# Patient Record
Sex: Male | Born: 1955 | Race: White | Hispanic: No | Marital: Single | Smoking: Never smoker
Health system: Southern US, Academic
[De-identification: ages and names within clinical notes are randomized; demographics above are authoritative.]

## PROBLEM LIST (undated history)

## (undated) ENCOUNTER — Ambulatory Visit (HOSPITAL_BASED_OUTPATIENT_CLINIC_OR_DEPARTMENT_OTHER): Payer: Self-pay | Admitting: Gastroenterology

## (undated) DIAGNOSIS — Z955 Presence of coronary angioplasty implant and graft: Secondary | ICD-10-CM

## (undated) DIAGNOSIS — K219 Gastro-esophageal reflux disease without esophagitis: Secondary | ICD-10-CM

## (undated) DIAGNOSIS — I2510 Atherosclerotic heart disease of native coronary artery without angina pectoris: Secondary | ICD-10-CM

## (undated) DIAGNOSIS — I219 Acute myocardial infarction, unspecified: Secondary | ICD-10-CM

## (undated) DIAGNOSIS — K469 Unspecified abdominal hernia without obstruction or gangrene: Secondary | ICD-10-CM

## (undated) DIAGNOSIS — E785 Hyperlipidemia, unspecified: Secondary | ICD-10-CM

## (undated) DIAGNOSIS — E119 Type 2 diabetes mellitus without complications: Secondary | ICD-10-CM

## (undated) DIAGNOSIS — I499 Cardiac arrhythmia, unspecified: Secondary | ICD-10-CM

## (undated) DIAGNOSIS — G4730 Sleep apnea, unspecified: Secondary | ICD-10-CM

## (undated) DIAGNOSIS — R197 Diarrhea, unspecified: Secondary | ICD-10-CM

## (undated) DIAGNOSIS — G459 Transient cerebral ischemic attack, unspecified: Secondary | ICD-10-CM

## (undated) DIAGNOSIS — G629 Polyneuropathy, unspecified: Secondary | ICD-10-CM

## (undated) DIAGNOSIS — I10 Essential (primary) hypertension: Secondary | ICD-10-CM

## (undated) DIAGNOSIS — Z8673 Personal history of transient ischemic attack (TIA), and cerebral infarction without residual deficits: Secondary | ICD-10-CM

## (undated) DIAGNOSIS — E7849 Other hyperlipidemia: Secondary | ICD-10-CM

## (undated) DIAGNOSIS — Z973 Presence of spectacles and contact lenses: Secondary | ICD-10-CM

## (undated) DIAGNOSIS — E1140 Type 2 diabetes mellitus with diabetic neuropathy, unspecified: Secondary | ICD-10-CM

## (undated) HISTORY — DX: Personal history of transient ischemic attack (TIA), and cerebral infarction without residual deficits: Z86.73

## (undated) HISTORY — DX: Type 2 diabetes mellitus without complications: E11.9

## (undated) HISTORY — DX: Essential (primary) hypertension: I10

## (undated) HISTORY — DX: Gastro-esophageal reflux disease without esophagitis: K21.9

## (undated) HISTORY — DX: Type 2 diabetes mellitus with diabetic neuropathy, unspecified: E11.40

## (undated) HISTORY — DX: Other hyperlipidemia: E78.49

## (undated) HISTORY — DX: Acute myocardial infarction, unspecified: I21.9

## (undated) HISTORY — PX: HX BACK SURGERY: SHX140

## (undated) HISTORY — PX: HAND SURGERY: SHX662

## (undated) HISTORY — DX: Atherosclerotic heart disease of native coronary artery without angina pectoris: I25.10

## (undated) HISTORY — PX: NECK SURGERY: SHX720

## (undated) HISTORY — PX: AMPUTATION AT METACARPAL: SUR22

## (undated) HISTORY — PX: HX CERVICAL SPINE SURGERY: 2100001197

## (undated) HISTORY — PX: CARPAL TUNNEL RELEASE: SHX101

## (undated) HISTORY — PX: LUMBAR LAMINECTOMY: SHX95

## (undated) HISTORY — PX: CERVICAL FUSION: SHX112

## (undated) HISTORY — PX: CHOLECYSTECTOMY: SHX55

## (undated) HISTORY — PX: CORONARY ANGIOPLASTY WITH STENT PLACEMENT: SHX49

---

## 1989-12-19 ENCOUNTER — Ambulatory Visit: Admit: 1989-12-19 | Payer: Self-pay | Source: Ambulatory Visit

## 1990-01-10 ENCOUNTER — Inpatient Hospital Stay
Admission: RE | Admit: 1990-01-10 | Disposition: A | Discharge status: Home / Self Care | Payer: Self-pay | Source: Ambulatory Visit

## 1992-11-11 ENCOUNTER — Ambulatory Visit: Admit: 1992-11-11 | Disposition: A | Discharge status: Home / Self Care | Payer: Self-pay | Source: Ambulatory Visit

## 1995-03-30 ENCOUNTER — Ambulatory Visit (HOSPITAL_COMMUNITY): Payer: Self-pay

## 1998-04-18 HISTORY — PX: HX LUMBAR SPINE SURGERY: 2100001196

## 2008-04-18 HISTORY — PX: HX STENTING (ANY): 2100001347

## 2011-09-23 ENCOUNTER — Ambulatory Visit (HOSPITAL_COMMUNITY): Payer: Self-pay | Admitting: Internal Medicine

## 2011-10-19 ENCOUNTER — Observation Stay
Admission: AD | Admit: 2011-10-19 | Disposition: A | Discharge status: Home / Self Care | Payer: Self-pay | Source: Other Acute Inpatient Hospital

## 2012-04-18 HISTORY — PX: COLONOSCOPY: SHX174

## 2012-05-18 LAB — LIPID PANEL
Cholesterol: 155 mg/dL (ref 75–199)
Coronary Heart Disease Risk: 4.84
HDL: 32 mg/dL — ABNORMAL LOW (ref 40–55)
LDL Calculated: 66 mg/dL
Triglycerides: 286 mg/dL — ABNORMAL HIGH (ref 10–150)
VLDL: 57 — ABNORMAL HIGH (ref 0–40)

## 2012-05-18 LAB — VH CARDIAC PROF.WITH TROPONIN
Creatine Kinase (CK): 68 U/L (ref 30–230)
Creatine Kinase (CK): 69 U/L (ref 30–230)
Creatinine Kinase MB (CKMB): 2.8 ng/mL (ref 0.1–6.0)
Creatinine Kinase MB (CKMB): 2.9 ng/mL (ref 0.1–6.0)
Troponin I: <0.01 ng/mL (ref 0.00–0.02)
Troponin I: <0.01 ng/mL (ref 0.00–0.02)

## 2012-05-18 LAB — VH DEXTROSE STICK GLUCOSE
Glucose POCT: 190 mg/dL — ABNORMAL HIGH (ref 70–99)
Glucose POCT: 222 mg/dL — ABNORMAL HIGH (ref 70–99)
Glucose POCT: 259 mg/dL — ABNORMAL HIGH (ref 70–99)
Glucose POCT: 286 mg/dL — ABNORMAL HIGH (ref 70–99)
Glucose POCT: 289 mg/dL — ABNORMAL HIGH (ref 70–99)
Glucose POCT: 294 mg/dL — ABNORMAL HIGH (ref 70–99)
Glucose POCT: 299 mg/dL — ABNORMAL HIGH (ref 70–99)
Glucose POCT: 331 mg/dL — ABNORMAL HIGH (ref 70–99)
Glucose POCT: 342 mg/dL — ABNORMAL HIGH (ref 70–99)

## 2012-05-18 LAB — CBC AND DIFFERENTIAL
Basophils %: 0.3 % (ref 0.0–3.0)
Basophils Absolute: 0 10*3/uL (ref 0.0–0.3)
Eosinophils %: 3.1 % (ref 0.0–7.0)
Eosinophils Absolute: 0.2 10*3/uL (ref 0.0–0.8)
Hematocrit: 46.8 % (ref 39.0–52.5)
Hemoglobin: 16.2 gm/dL (ref 13.0–17.5)
Lymphocytes Absolute: 2.6 10*3/uL (ref 0.6–5.1)
Lymphocytes: 32.6 % (ref 15.0–46.0)
MCH: 30 pg (ref 28–35)
MCHC: 35 gm/dL (ref 32–36)
MCV: 86 fL (ref 80–100)
MPV: 7.1 fL (ref 6.0–10.0)
Monocytes Absolute: 0.5 10*3/uL (ref 0.1–1.7)
Monocytes: 6.7 % (ref 3.0–15.0)
Neutrophils %: 57.3 % (ref 42.0–78.0)
Neutrophils Absolute: 4.5 10*3/uL (ref 1.7–8.6)
PLT CT: 191 10*3/uL (ref 130–440)
RBC: 5.42 10*6/uL (ref 4.00–5.70)
RDW: 12.4 % (ref 11.0–14.0)
WBC: 7.9 10*3/uL (ref 4.0–11.0)

## 2012-05-18 LAB — BASIC METABOLIC PANEL
Anion Gap: 13.8 mMol/L (ref 7.0–18.0)
BUN / Creatinine Ratio: 12.7 Ratio (ref 10.0–30.0)
BUN: 10 mg/dL (ref 7–22)
CO2: 26.2 mMol/L (ref 20.0–30.0)
Calcium: 9.4 mg/dL (ref 8.5–10.5)
Chloride: 99 mMol/L (ref 98–110)
Creatinine: 0.79 mg/dL — ABNORMAL LOW (ref 0.80–1.30)
EGFR: >60 mL/min/{1.73_m2}
Glucose: 245 mg/dL — ABNORMAL HIGH (ref 70–99)
Osmolality Calc: 277 mOsm/kg (ref 275–300)
Potassium: 4 mMol/L (ref 3.5–5.3)
Sodium: 135 mMol/L — ABNORMAL LOW (ref 136–147)

## 2012-08-28 LAB — ECG 12-LEAD
P Wave Axis: 44 deg
P Wave Axis: 62 deg
P Wave Duration: 110 ms
P Wave Duration: 110 ms
P-R Interval: 190 ms
P-R Interval: 196 ms
Patient Age: 55 years
Patient Age: 55 years
Q-T Dispersion: 24 ms
Q-T Dispersion: 58 ms
Q-T Interval(Corrected): 421 ms
Q-T Interval(Corrected): 427 ms
Q-T Interval: 350 ms
Q-T Interval: 370 ms
QRS Axis: 16 deg
QRS Axis: 31 deg
QRS Duration: 78 ms
QRS Duration: 78 ms
T Axis: 20 deg
T Axis: 51 deg
Ventricular Rate: 80 /min
Ventricular Rate: 87 /min

## 2012-09-08 NOTE — Discharge Summary (Signed)
[facility]                               DISCHARGE SUMMARY              NAME: Michael [name], Michael [name]                 ADMITTED:   10/19/2011       MR#:  [id number]                            DISCHARGED: 10/21/2011       DOB:  04-10-1956                           ACCT#:      [id number]       _________________________________________________________________       CHIEF COMPLAINT:       Chest pain.              HISTORY OF PRESENT ILLNESS:       The patient is a 56 year old white male with a past medical       history significant for diabetes mellitus, MI, coronary disease,       status post stents in 2000, dyslipidemia, depression, anxiety       and neuropathy, was admitted for his complaints of chest pain.       The patient was admitted for chest pain to rule out MI protocol,       was ruled out by cardiac enzymes.  He was scheduled for a stress       Cardiolite during which he developed chest pain.  He only was       given the contrast and he underwent rest images, which were       apparently normal.  I discontinued the stress test, and       consulted the Cardiology service.  The patient was seen by Dr.       Gwyneth Revels scheduled for cardiac catheterization.  He underwent       cardiac cath showing no major occlusive disease needing to be       PTCA or stented.  The patient will be returned to the floor.       After he is ambulatory, he will be discharged home.              DISCHARGE MEDICATIONS:       He will be discharged on,              1.  Abilify 10 mg daily.       2.  Aspirin 81 mg daily.       3.  Amitriptyline 100 mg at bedtime.       4.  Clonazepam 0.5 mg b.i.d.       5.  Famotidine 20 mg b.i.d.       6.  Gabapentin 300 mg t.i.d.       7.  Glimepiride 4 mg every morning.       8.  Hydrochlorothiazide 25 mg half a tab daily.       9.  Isosorbide 20 mg daily.       10.  Januvia 100 mg daily.       11.  Lovaza 4 mg daily.       12.  Plavix 75 mg daily.  13.  Toprol-XL 100 mg  once daily.       14.  WelChol 625 mg six tabs once daily.       15.  Xyzal 5 mg daily.       16.  Zoloft 50 mg daily.              The patient will be assigned to [facility] for followup.       All medications were dispensed.              FINAL DIAGNOSES:       1.  Chest pain, which is noncardiac.       2.  History of coronary disease.       3.  Anxiety.       4.  Diabetes mellitus.       5.  Hypertension.                                            **PRELIMINARY REPORT UNLESS SIGNED**                                SEE DOCUMENT IMAGING SYSTEM FOR FINAL REPORT                                                               ________________________________                                        Jamas Lav, MD                                                                                                                 ID:   [id number]                                                          DocType:   [id number]       \:   RR                                                                 /:   394  DD:  10/21/2011 14:59:12                                                DT:  10/22/2011 03:18:15                                                JOB: [id number]                                                            CC:  PHYSICIAN NO PRIMARY (1002)            DR NO REFERRAL (1000)                   >                                                                  Authenticated by Jamas Lav, MD On 10/22/2011 03:56:30 PM

## 2012-09-08 NOTE — H&P (Signed)
[facility]                             HISTORY AND PHYSICAL              NAME: Michael [name], Michael [name]                 ADMITTED:   10/19/2011       MR#:  [id number]                            ACCT#:      [id number]       DOB:  [birth year]       _________________________________________________________________       PRIMARY CARE PHYSICIAN:       No PCP.              CHIEF COMPLAINT:       Chest pain and diarrhea.              HISTORY OF PRESENT ILLNESS:       This is a 57 year old male with a history of diabetes, coronary       artery disease, three myocardial infarctions status post stent       who is coming to the facility because yesterday all day he had       diarrhea, he states approximately 10 times, not associated with       any pain or any blood.  The diarrhea resolved after ______       regarding diarrhea, but this morning while he was sitting       talking to his coworker, he had some chest pain he described as       located in the substernal area, gradual onset, persistent, it       became moderate to severe at some point, it lasted for 30       minutes, the pain has been going on for a few hours, but only it       is mild.  He states he had some mild pressure sensation of his       chest.  He did not have any associated symptoms like cough,       shortness of breath or fever, but he had some sweats when he was       having the significant pain.  Because of that, the patient went       to [facility] and then he was transferred to this       facility.              PAST MEDICAL HISTORY:       1.  Diabetes.       2.  Myocardial infarction.       3.  Coronary artery disease status post two stents in 2000.       4.  Dyslipidemia.       5.  Depression.       6.  Anxiety.       7.  Neuropathy.              PAST SURGICAL HISTORY:       1.  Cardiac stents.       2.  Back surgery.       3.  Neck surgery.              FAMILY HISTORY:  Parents with coronary artery  disease.              SOCIAL HISTORY:       Denies use of alcohol, tobacco or illicit drugs.              REVIEW OF SYSTEMS:       Pertinent positives as described in HPI.  The rest of 10 point       review of system was done and negative.              HOME MEDICATIONS:       The patient states that the only medication he has been taking       has been Klonopin, he is supposed to be on a long list of       medication, but he has not been able to afford.  So, again for              the past two months, he has only taken Klonopin.  Full list of       home medications includes:              1.  Abilify.       2.  Aspirin.       3.  Amitriptyline.       4.  Clonazepam.       5.  Famotidine.       6.  Gabapentin.       7.  Glimepiride.       8.  Hydrochlorothiazide.       9.  ______       10.  Januvia.       11.  Lantus.       12.  Lovaza.       13.  NovoLog.       14.  Plavix.       15.  Toprol.       16.  WelChol.       17.  Levocetirizine.       18.  Zoloft.       19.  Cyclobenzaprine.       20.  Nitrostat.       21.  Tramadol.              ALLERGIES:       MORPHINE, GLUCOPHAGE, and KEFLEX.              PHYSICAL EXAMINATION:       VITAL SIGNS:  Temperature of 97.5, heart rate 76, respirations       18, O2 sat 98% on room air, and blood pressure 122/77.       CONSTITUTIONAL:  The patient is awake, alert.  He does not seem       to be in any acute distress right now.       EYES:  Pupils equal, round, and reactive to light, extraocular       movements intact.       ENT/MOUTH:  Ears without any abnormalities or secretions.  No       nasal discharge or lesions.  Moist mucous membranes.       NECK:  No JVD.  No masses.  Trachea midline.       CARDIOVASCULAR:  Regular rate and rhythm.  No murmur.       PULMONARY:  No respiratory distress.  No retractions.  Clear to       auscultation bilaterally.  ABDOMEN:  Positive bowel sounds.  Soft, nontender.  No masses.       EXTREMITIES:  No clubbing, cyanosis, or edema.        PSYCHIATRIC:  Oriented to time, place, and person.              LABORATORY DATA:       From [facility], the patient had an EKG showing normal sinus       rhythm.  CBC within normal limits.  CMP remarkable for glucose       361 and ALT of 46.  Troponin 0.03 (normal 0 to 0.09).              ASSESSMENT:       1.  Chest pain.       2.  Coronary artery disease status post stent.       3.  Diabetes mellitus.       4.  Dyslipidemia.       5.  Depression.       6.  Anxiety.       7.  Neuropathy.       8.  Diarrhea resolved.                     PLAN:       1.  ______ chest pain in the setting of coronary artery           disease with previous stents, the patient has ongoing chest           pain, I am going to order a cardiac profile with troponin           stat and if it is positive, consult Cardiology.  Otherwise,           repeat the test in few hours and then if it is negative, we           will schedule the patient for a stress test.  In this case           with the patient high risk for coronary artery disease, I           will not let him go unless we do the stress test, tomorrow is           a holiday, so this may need to be done in two days.       2.  For diabetes mellitus, for now, we will keep on           sliding scale to see how many units he needs here and then           put him back on the Lantus.  Apparently at home, at some           point, he was taking 90 units twice a day.       3.  For dyslipidemia, we will check a lipid panel in the           morning, this patient should have been ______ but I do not           know why he stopped taking them.  Will check lipid panels and           then put ______ in the morning.       4.  For the depression and anxiety, this is chronic, we           will restart some of his medication.  5.  For neuropathy, we will restart Neurontin.       6.  Diarrhea, this is resolved and not causing any           symptoms.  So, we are not going do any workup here.                                             **PRELIMINARY REPORT UNLESS SIGNED**                                SEE DOCUMENT IMAGING SYSTEM FOR FINAL REPORT                I hereby certify this patient for hospitalization based upon        medical necessity as noted above.                                                      ________________________________                                        Jamison Oka, MD                                                                                                          ID:   [id number]                                                          DocType:   01TRH       \:   NR                                                                 /:   854       DD:  10/19/2011 15:20:16                                                DT:  10/19/2011 20:14:36  JOB: [id number]                                                            CC:  [facility] [place] [phone])            PHYSICIAN NO PRIMARY (1002)            DR NO REFERRAL (1000)                   >                                                                  Authenticated by Ihor Gully, MD On 10/24/2011 11:32:51 PM

## 2012-09-08 NOTE — Procedures (Signed)
[facility]                               CARDIOVASCULAR LABORATORY                                      STRESS TEST       Nuclear medicine stress test.              NAME:Kluttz, Michael [name]                               ROOM#:I/0314-[id number]              DOB:[birth year]                                              AGE/SEX: 55/M       ZH#:[id number]       [id number]       _________________________________________________________________________              TEST DATE: 10/21/2011                REQ PHYS: Jamison Oka, MD                                                                                              ORDER#: [id number]                               INT PHYS: Aurelio Brash, MD       HGT: --                                                        WEIGHT: --       TECH: --                                              BP: --                                                                                         _________________________________________________________________________       DATE OF PROCEDURE:  10/21/2011              REQUESTING PHYSICIAN:       Jamison Oka, MD              INT PHYSICIAN:       Aurelio Brash, MD              HEIGHT:       --              WEIGHT:       --              BLOOD PRESSURE:       --              TECHNICIAN:       --              TYPE OF REPORT:       Nuclear medicine stress test.              TIME OF TEST:       --              INDICATION:       --              INTERPRETATION:       PROCEDURE:  This is a rest only scan.  The patient is having active       chest pain.  Scan is done to evaluate for perfusion abnormality.  It is       a nongated study.  Patient received 16.3 mCi of sestamibi and rest       images obtained.  The SPECT stress findings show a moderate area tracer       reduction in the apical and inferoapical distribution.  Summed rest       score is 4.                     CONCLUSION:        Abnormal resting study showing area of decreased perfusion in the       inferior and inferoapical distribution with summed rest score of 4.       This is a nongated study.  No stress is performed.                                            **PRELIMINARY REPORT UNLESS SIGNED**                                        SEE DOCUMENT IMAGING SYSTEM FOR FINAL REPORT                                                                        ________________________________  Aurelio Brash, MD                                                                                                                           ID:   [id number]                                                                  DocType:   01TRVC       \:   VH                                                                         /:   359       DD:  10/21/2011 11:19:11                                                        DT:  10/21/2011 12:08:47                                                        JOB: [id number]                                                                    CC:  PHYSICIAN NO PRIMARY (1002)            DR NO REFERRAL (1000)                   >                                                                          Authenticated by Aurelio Brash, MD On 10/26/2011 11:24:23 AM

## 2012-09-08 NOTE — Op Note (Signed)
[facility]                                [facility]                             at [facility]              NAME: Michael [name], Michael [name]                                  ROOM#: I/--[id number]              DOB:  [birth year]                                            AGE/SEX: 55/M       AO#:[id number]                                                     CINE#:       [id number]       _________________________________________________________________________              TEST DATE: 10-21-2011                            REF PHYS: : DR NO REFERRAL                                      LEFT HEART CATHETERIZATION              DATE OF PROCEDURE:       10-21-2011              REFERRING PHYSICIAN:       Jamison Oka, MD              CINE NUMBER:                     TYPE OF REPORT:       LEFT HEART CATHETERIZATION              PHYSICIAN PERFORMING PROCEDURE:       Bing Quarry, MD              INDICATION:       Chest pain with prior right coronary intervention              PROCEDURES PERFORMED:       1.  Left heart catheterization       2.  Selective coronary angiography       3.  Left ventriculography       4.  Angio-Seal closure              ARTERIAL ACCESS:       Right femoral artery with modified Seldinger technique              CONTRAST:       65ml Omnipaque  MEDICATIONS:       4000 units of Heparin in 1000 ml of NS       10000 units of Heparin in 1000 ml of NS       Versed 1mg  IV       Fentanyl 50 mcg IV       Lidocaine 1%              Fluoroscopy time for the procedure was 1.8 minutes.              EQUIPMENT:       [employer] 6 Fr. Angio-Kit Angled       [employer] Angiographic.035 145cm J wire       Terumo 6 Fr. Short Sheath       Cook 18g needle              ACIST: Syringe, Manifold, Controller       6 Fr. Angio-Seal              ANGIOGRAPHIC FINDINGS:       There is moderate to heavy calcification of the proximal coronary        arteries, particularly of the left system. Fluoroscopically there is a       stent seen in the distal right coronary artery as well.              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              The left circumflex is a nondominant vessel heavily calcified       proximally. It almost looks like there is a stent there but there is no       history of a stent as far as I can tell. Two very small, almost       hair-like marginals take off from the first centimeter or so of the       vessel. It continues on where there is a knuckle and then a pair of       small marginals. Just beyond the takeoff of the marginals there is a       40-50% lesion and the vessel continues on to give rise to a large       posterior lateral branch. Recurrent atrial branch is also identified.              The left anterior descending artery is imaged separately. It is       calcified as well. There is moderate atherosclerosis proximally. A small       first septal is seen. Two medium size diagonal branches are seen.       Moderate atherosclerotic changes are noted. The vessel continues on to       wrap the LV apex. It is free of obstructive lesions.              The right coronary artery is heavily calcified. I believe there is       approximately 50% ostial stenosis followed by an area of ectasia. At the       acute margin there is a previously placed stent with approximately 30%       encroachment by neointima but no significant obstructive disease. It       continues on to give rise to a moderate-to-large posterior descending       artery and a couple of small posterior lateral branches. Luminal       irregularities  are noted.              HEMODYNAMICS:       AO   96/59 (76)         AOP   92/49 (68)       LV    98/-2 (4)           LVP    95/-5, 4              LEFT VENTRICULOGRAPHY:       Left ventriculography in the RAO projection demonstrates inferobasalar       hypokinesis with an overall ejection fraction estimated at 50-55%.               FINAL DIAGNOSES:       1.  Prior right coronary stent which remains patent with minimal           luminal encroachment.       2.  3-vessel atherosclerosis without significant obstructive disease.       3.  Normal overall systolic function with focal inferior basal           hypokinesis.              RECOMMENDATIONS: Medical management with vigorous secondary prevention.                                            **PRELIMINARY REPORT UNLESS SIGNED**                                        SEE DOCUMENT IMAGING SYSTEM FOR FINAL REPORT                                                                        ________________________________                                                Bing Quarry, MD                                                                                                                                 ID:   [id number]  DocType:   01TRCH       \:   BB                                                                         /:   251       DD:  10/21/2011 14:57:30                                                        DT:  10/24/2011 08:33:51                                                        JOB: [id number]                                                                    CC:  PHYSICIAN NO PRIMARY (1002)            DR NO REFERRAL (1000)                   >                                                                          Authenticated by Bing Quarry., MD On 10/24/2011 01:09:25 PM

## 2012-09-08 NOTE — Consults (Signed)
[facility]                                 CONSULTATION              NAME: Michael [name], Michael [name]                 ADMITTED:   10/19/2011       MR#:  [id number]                            ACCT#:      [id number]       DOB:  1955/05/24       _________________________________________________________________              [place]              DATE OF CONSULTATION:       10/21/2011              CONSULTING PHYSICIAN:       Tye Savoy, MD              REFERRING PHYSICIAN:       Jamas Lav, MD              REASON FOR CONSULTATION:       Possible acute coronary syndrome.              HISTORY OF PRESENT ILLNESS:       The patient is referred from Dr. Gerrit Heck.  Michael [name] is a       pleasant 57 year old gentleman with known artery coronary       disease.  Apparently presented with myocardial infarction in       2000, had stenting at [facility] in [facility].  He had       did well for several years and then in 2010, he had recurrent       angina, again underwent another stent placement, but again none       of those details are available.              [place] subsequently moved to [place] and due to financial issues,       he has stopped all of his medicines two months ago.  Over the       last two days, he has intermittent chest tightness and sharp       chest pain, which prompted to go to the emergency room in       [place].  He subsequent transferred to [facility] where he       ruled out for myocardial infarction and was scheduled for stress       testing, however now we brought him for a stress test, he       developed a 6/10 chest pain, was brought back to his room where       he is now pain free and we were asked to evaluate him.              His coronary risk factors include diabetes since 1997,       longstanding hypertension, hyperlipidemia, and a family history       of heart disease with two brothers having myocardial       infarctions.  Parents also having  coronary disease, although in       their 73s.  He is a nonsmoker.              PAST MEDICAL HISTORY:       The patient has cardiac issues as above.  He had neck surgery       approximately five years ago and lower back surgery       approximately 10 years ago, otherwise unremarkable.              ALLERGIES:       MORPHINE, KEFLEX, and GLUCOPHAGE.              MEDICATIONS:       The only medicines he has taken for the last two months are       aspirin and clonazepam.  The medicines he is supposed to be on:                     1.  Abilify 10 mg daily.       2.  Amitriptyline 100 mg at bedtime.       3.  Famotidine 20 mg twice a day.       4.  Neurontin 300 mg 3 times a day.       5.  Glimepiride 7 mg in the morning.       6.  Hydrochlorothiazide 12.5 mg daily.       7.  Imdur 20.       8.  Januvia 100.       9.  Lantus 90 units.       10.  Lovaza 4.       11.  Plavix 75.       12.  Toprol 100, which he did receive in the hospital.       13.  WelChol.       14.  Zoloft 50.       15.  Cyclobenzaprine 10, 2 times a day.       16.  Tramadol.       17.  He apparently been on Lipitor in the past.  He is not           sure why it is not on his list.              SOCIAL HISTORY:       Nonsmoker.  No alcohol.  Now lives in [place].  No       significant caffeine.              FAMILY HISTORY:       Positive as above.              REVIEW OF SYSTEMS:       SYSTEMIC:  He has lost about 40 to 50 pounds in the last year of       unknown cause. GI:  No nausea, vomiting, hematemesis,       hematochezia.  PULMONARY:  No shortness of breath, hemoptysis,       PND, or orthopnea.  NEUROLOGIC:  No stroke, weakness, dizziness.       MUSCULOSKELETAL:  Chronic neck pain and stiffness in his neck       from surgery.              Remainder of review of systems is negative.              PHYSICAL EXAMINATION:       VITAL SIGNS:  Blood pressure 120/80, pulse is 70  and regular,       respiratory rate is 18.       GENERAL:  Patient is  well-developed, well-nourished gentleman,       in no distress.       HEENT:  Negative.       NECK:  Supple.       CARDIAC:  Normal first and second heart sounds.  No murmurs were       heard.  Carotids are 2+.  No carotid bruits.       LUNGS:  Clear.       ABDOMEN:  Soft, nontender.  No masses.       EXTREMITIES:  Without edema.       NEUROLOGIC:  Nonfocal.              DIAGNOSTIC DATA:       EKG was normal.              LABORATORY VALUES:       Chem-8 was significant for glucose 245 from yesterday, BUN 10,       and creatinine 0.8.  Cholesterol 155, triglycerides 286, HDL 32,       LDL 66.  Troponins all less than 0.01.  CK is negative.  White       count 10.9, hemoglobin 16.2.                     A chest x-ray, which was done in [place], I did not have that       report.              IMPRESSION:       1.  Chest pain:  Although enzymes are all negative.  The           patient states it is similar to what he had when he had his           previous stent put in.  EKG is normal.  I think it would be           the safe choice to sort things out, is to proceed with           cardiac catheterization.  I do not think stress test would be           helpful here.  We will schedule for today and the procedure           risks were fully discussed including heart attack, stroke,           death.  Procedure is fully discussed.       2.  Diabetes.       3.  Dyslipidemia.       4.  Anxiety.       5.  Medical noncompliance.  We would be concerned about           Drug-eluting stent, but will need to see Case Management           regarding his medications.              Thank you for asking [place] to see Michael [name].  We will schedule for       cardiac catheterization.  We will follow up on that depending on       the results.                                            **  PRELIMINARY REPORT UNLESS SIGNED**                                SEE DOCUMENT IMAGING SYSTEM FOR FINAL REPORT                                                         ________________________________                                        Tye Savoy, MD                                                                                                           ID:   [id number]                                                          DocType:   [id number]       \:   JP                                                                 /:   [id number]       DD:  10/21/2011 11:00:54                                                DT:  10/21/2011 12:18:12                                                JOB: [id number]                                                            CC:  Jefm Petty, MD ([id number])            Jamas Lav, MD ([id number])            PHYSICIAN NO PRIMARY ([id number])  DR NO REFERRAL ([id number])                   >                                                                  Authenticated by Tye Savoy, MD On 10/26/2011 11:29:36 AM

## 2012-12-13 ENCOUNTER — Ambulatory Visit: Payer: BC Managed Care – PPO | Admitting: Cardiovascular Disease

## 2012-12-13 ENCOUNTER — Ambulatory Visit
Admission: RE | Admit: 2012-12-13 | Discharge: 2012-12-14 | Disposition: A | Discharge status: Home / Self Care | Payer: BC Managed Care – PPO | Source: Ambulatory Visit | Attending: Cardiovascular Disease | Admitting: Cardiovascular Disease

## 2012-12-13 ENCOUNTER — Encounter
Admission: RE | Disposition: A | Discharge status: Home / Self Care | Payer: Self-pay | Source: Ambulatory Visit | Attending: Cardiovascular Disease

## 2012-12-13 DIAGNOSIS — Z888 Allergy status to other drugs, medicaments and biological substances status: Secondary | ICD-10-CM | POA: Insufficient documentation

## 2012-12-13 DIAGNOSIS — E785 Hyperlipidemia, unspecified: Secondary | ICD-10-CM | POA: Insufficient documentation

## 2012-12-13 DIAGNOSIS — Z8249 Family history of ischemic heart disease and other diseases of the circulatory system: Secondary | ICD-10-CM | POA: Insufficient documentation

## 2012-12-13 DIAGNOSIS — E119 Type 2 diabetes mellitus without complications: Secondary | ICD-10-CM | POA: Insufficient documentation

## 2012-12-13 DIAGNOSIS — I679 Cerebrovascular disease, unspecified: Secondary | ICD-10-CM | POA: Insufficient documentation

## 2012-12-13 DIAGNOSIS — Z7982 Long term (current) use of aspirin: Secondary | ICD-10-CM | POA: Insufficient documentation

## 2012-12-13 DIAGNOSIS — I2510 Atherosclerotic heart disease of native coronary artery without angina pectoris: Secondary | ICD-10-CM | POA: Insufficient documentation

## 2012-12-13 DIAGNOSIS — Z9861 Coronary angioplasty status: Secondary | ICD-10-CM | POA: Insufficient documentation

## 2012-12-13 DIAGNOSIS — K219 Gastro-esophageal reflux disease without esophagitis: Secondary | ICD-10-CM | POA: Insufficient documentation

## 2012-12-13 DIAGNOSIS — Z79899 Other long term (current) drug therapy: Secondary | ICD-10-CM | POA: Insufficient documentation

## 2012-12-13 DIAGNOSIS — Z885 Allergy status to narcotic agent status: Secondary | ICD-10-CM | POA: Insufficient documentation

## 2012-12-13 DIAGNOSIS — I10 Essential (primary) hypertension: Secondary | ICD-10-CM | POA: Insufficient documentation

## 2012-12-13 DIAGNOSIS — Z823 Family history of stroke: Secondary | ICD-10-CM | POA: Insufficient documentation

## 2012-12-13 DIAGNOSIS — Z8673 Personal history of transient ischemic attack (TIA), and cerebral infarction without residual deficits: Secondary | ICD-10-CM | POA: Insufficient documentation

## 2012-12-13 DIAGNOSIS — Z833 Family history of diabetes mellitus: Secondary | ICD-10-CM | POA: Insufficient documentation

## 2012-12-13 DIAGNOSIS — Z883 Allergy status to other anti-infective agents status: Secondary | ICD-10-CM | POA: Insufficient documentation

## 2012-12-13 DIAGNOSIS — Z7902 Long term (current) use of antithrombotics/antiplatelets: Secondary | ICD-10-CM | POA: Insufficient documentation

## 2012-12-13 HISTORY — DX: Unspecified abdominal hernia without obstruction or gangrene: K46.9

## 2012-12-13 HISTORY — DX: Gastro-esophageal reflux disease without esophagitis: K21.9

## 2012-12-13 HISTORY — DX: Essential (primary) hypertension: I10

## 2012-12-13 HISTORY — DX: Transient cerebral ischemic attack, unspecified: G45.9

## 2012-12-13 HISTORY — PX: CORONARY ANGIOPLASTY WITH STENT PLACEMENT: SHX49

## 2012-12-13 HISTORY — DX: Hyperlipidemia, unspecified: E78.5

## 2012-12-13 HISTORY — DX: Atherosclerotic heart disease of native coronary artery without angina pectoris: I25.10

## 2012-12-13 HISTORY — DX: Acute myocardial infarction, unspecified: I21.9

## 2012-12-13 HISTORY — DX: Type 2 diabetes mellitus without complications: E11.9

## 2012-12-13 LAB — ECG 12-LEAD
P Wave Axis: 49 deg
P Wave Duration: 120 ms
P-R Interval: 224 ms
Patient Age: 57 years
Q-T Dispersion: 30 ms
Q-T Interval(Corrected): 405 ms
Q-T Interval: 360 ms
QRS Axis: 24 deg
QRS Duration: 78 ms
T Axis: 45 deg
Ventricular Rate: 76 /min

## 2012-12-13 LAB — I-STAT CHEM 8 CARTRIDGE
Anion Gap I-Stat: 17 — ABNORMAL HIGH (ref 7–16)
BUN I-Stat: 17 mg/dL (ref 6–20)
Calcium Ionized I-Stat: 5 mg/dL (ref 4.35–5.10)
Chloride I-Stat: 99 mMol/L (ref 98–112)
Creatinine I-Stat: 0.8 mg/dL — ABNORMAL LOW (ref 0.90–1.30)
EGFR: >60 mL/min/{1.73_m2}
Glucose I-Stat: 246 mg/dL — ABNORMAL HIGH (ref 70–99)
Hematocrit I-Stat: 39 % (ref 39.0–52.5)
Hemoglobin I-Stat: 13.3 gm/dL (ref 13.0–17.5)
Potassium I-Stat: 3.9 mMol/L (ref 3.5–5.3)
Sodium I-Stat: 138 mMol/L (ref 135–145)
TCO2 I-Stat: 26 mMol/L (ref 24–29)

## 2012-12-13 LAB — CBC AND DIFFERENTIAL
Basophils %: 0.9 % (ref 0.0–3.0)
Basophils Absolute: 0.1 10*3/uL (ref 0.0–0.3)
Eosinophils %: 3 % (ref 0.0–7.0)
Eosinophils Absolute: 0.2 10*3/uL (ref 0.0–0.8)
Hematocrit: 41.6 % (ref 39.0–52.5)
Hemoglobin: 15.2 gm/dL (ref 13.0–17.5)
Lymphocytes Absolute: 3.3 10*3/uL (ref 0.6–5.1)
Lymphocytes: 46.5 % — ABNORMAL HIGH (ref 15.0–46.0)
MCH: 30 pg (ref 28–35)
MCHC: 37 gm/dL — ABNORMAL HIGH (ref 32–36)
MCV: 83 fL (ref 80–100)
MPV: 6.9 fL (ref 6.0–10.0)
Monocytes Absolute: 0.5 10*3/uL (ref 0.1–1.7)
Monocytes: 6.8 % (ref 3.0–15.0)
Neutrophils %: 42.8 % (ref 42.0–78.0)
Neutrophils Absolute: 3 10*3/uL (ref 1.7–8.6)
PLT CT: 228 10*3/uL (ref 130–440)
RBC: 5.02 10*6/uL (ref 4.00–5.70)
RDW: 12.2 % (ref 11.0–14.0)
WBC: 7 10*3/uL (ref 4.0–11.0)

## 2012-12-13 LAB — LIPID PANEL
Cholesterol: 126 mg/dL (ref 75–199)
Coronary Heart Disease Risk: 5.25
HDL: 24 mg/dL — ABNORMAL LOW (ref 40–55)
LDL Calculated: UNDETERMINED mg/dL
Triglycerides: 678 mg/dL — ABNORMAL HIGH (ref 10–150)
VLDL: UNDETERMINED (ref 0–40)

## 2012-12-13 SURGERY — LEFT HEART CATH

## 2012-12-13 MED ORDER — HEPARIN (PORCINE) IN NACL 2-0.9 UNIT/ML-% IJ SOLN
INTRAMUSCULAR | Status: AC
Start: 2012-12-13 — End: 2012-12-13
  Filled 2012-12-13: qty 500

## 2012-12-13 MED ORDER — AMITRIPTYLINE HCL 50 MG PO TABS
100.0000 mg | ORAL_TABLET | Freq: Every evening | ORAL | Status: DC
Start: 2012-12-13 — End: 2012-12-14
  Administered 2012-12-13: 100 mg via ORAL
  Filled 2012-12-13 (×2): qty 2

## 2012-12-13 MED ORDER — IODIXANOL 320 MG/ML IV SOLN
160.0000 mL | Freq: Once | INTRAVENOUS | Status: AC | PRN
Start: 2012-12-13 — End: 2012-12-13
  Administered 2012-12-13: 160 mL via INTRA_ARTERIAL

## 2012-12-13 MED ORDER — STERILE WATER FOR INJECTION IJ SOLN
INTRAMUSCULAR | Status: AC
Start: 2012-12-13 — End: 2012-12-13
  Filled 2012-12-13: qty 10

## 2012-12-13 MED ORDER — INSULIN DETEMIR 100 UNIT/ML SC SOPN
60.0000 [IU] | PEN_INJECTOR | Freq: Every evening | SUBCUTANEOUS | Status: DC
Start: 2012-12-13 — End: 2012-12-14
  Administered 2012-12-13: 60 [IU] via SUBCUTANEOUS
  Filled 2012-12-13: qty 3

## 2012-12-13 MED ORDER — VH NITROGLYCERIN 5 MG IN NS 100 ML (CATH LAB)
INTRAVENOUS | Status: AC
Start: 2012-12-13 — End: 2012-12-13
  Administered 2012-12-13: 100 ug via INTRA_ARTERIAL
  Administered 2012-12-13: 200 ug via INTRA_ARTERIAL
  Filled 2012-12-13: qty 100

## 2012-12-13 MED ORDER — SODIUM CHLORIDE 0.9 % IV SOLN
INTRAVENOUS | Status: DC
Start: 2012-12-13 — End: 2012-12-14

## 2012-12-13 MED ORDER — BIVALIRUDIN 250 MG IV SOLR
INTRAVENOUS | Status: AC
Start: 2012-12-13 — End: 2012-12-13
  Filled 2012-12-13: qty 250

## 2012-12-13 MED ORDER — CYCLOBENZAPRINE HCL 10 MG PO TABS
10.0000 mg | ORAL_TABLET | Freq: Three times a day (TID) | ORAL | Status: DC | PRN
Start: 2012-12-13 — End: 2012-12-14
  Filled 2012-12-13: qty 1

## 2012-12-13 MED ORDER — ATORVASTATIN CALCIUM 10 MG PO TABS
10.0000 mg | ORAL_TABLET | Freq: Every evening | ORAL | Status: DC
Start: 2012-12-13 — End: 2012-12-14
  Administered 2012-12-13: 10 mg via ORAL
  Filled 2012-12-13 (×2): qty 1

## 2012-12-13 MED ORDER — GLIMEPIRIDE 2 MG PO TABS
8.0000 mg | ORAL_TABLET | Freq: Every morning | ORAL | Status: DC
Start: 2012-12-14 — End: 2012-12-14
  Administered 2012-12-14: 8 mg via ORAL
  Filled 2012-12-13: qty 4

## 2012-12-13 MED ORDER — PRASUGREL HCL 10 MG PO TABS
ORAL_TABLET | ORAL | Status: AC
Start: 2012-12-13 — End: 2012-12-13
  Administered 2012-12-13: 60 mg via ORAL
  Filled 2012-12-13: qty 6

## 2012-12-13 MED ORDER — ISOSORBIDE MONONITRATE ER 30 MG PO TB24
30.0000 mg | ORAL_TABLET | Freq: Every day | ORAL | Status: DC
Start: 2012-12-13 — End: 2012-12-14
  Administered 2012-12-14: 30 mg via ORAL
  Filled 2012-12-13 (×2): qty 1

## 2012-12-13 MED ORDER — TRAMADOL HCL 50 MG PO TABS
50.0000 mg | ORAL_TABLET | Freq: Four times a day (QID) | ORAL | Status: DC
Start: 2012-12-13 — End: 2012-12-14
  Administered 2012-12-13: 50 mg via ORAL
  Filled 2012-12-13: qty 1

## 2012-12-13 MED ORDER — VH HEPARIN 10,000 UNITS/1000 ML NS
INJECTION | INTRAVENOUS | Status: AC
Start: 2012-12-13 — End: 2012-12-13
  Filled 2012-12-13: qty 1000

## 2012-12-13 MED ORDER — SITAGLIPTIN PHOSPHATE 100 MG PO TABS
100.0000 mg | ORAL_TABLET | Freq: Every day | ORAL | Status: DC
Start: 2012-12-13 — End: 2012-12-14
  Administered 2012-12-14: 100 mg via ORAL
  Filled 2012-12-13 (×4): qty 1

## 2012-12-13 MED ORDER — MIDAZOLAM HCL 2 MG/2ML IJ SOLN
INTRAMUSCULAR | Status: AC
Start: 2012-12-13 — End: 2012-12-13
  Administered 2012-12-13: 1 mg via INTRAVENOUS
  Filled 2012-12-13: qty 2

## 2012-12-13 MED ORDER — ASPIRIN 81 MG PO CHEW
81.0000 mg | CHEWABLE_TABLET | Freq: Every day | ORAL | Status: DC
Start: 2012-12-13 — End: 2012-12-14
  Administered 2012-12-14: 81 mg via ORAL
  Filled 2012-12-13: qty 1

## 2012-12-13 MED ORDER — METOPROLOL TARTRATE 100 MG PO TABS
100.0000 mg | ORAL_TABLET | Freq: Two times a day (BID) | ORAL | Status: DC
Start: 2012-12-13 — End: 2012-12-14
  Administered 2012-12-13 – 2012-12-14 (×2): 100 mg via ORAL
  Filled 2012-12-13 (×4): qty 1

## 2012-12-13 MED ORDER — FAMOTIDINE 20 MG PO TABS
20.0000 mg | ORAL_TABLET | Freq: Every evening | ORAL | Status: DC
Start: 2012-12-13 — End: 2012-12-14
  Administered 2012-12-13: 20 mg via ORAL
  Filled 2012-12-13 (×2): qty 1

## 2012-12-13 MED ORDER — FENTANYL CITRATE 0.05 MG/ML IJ SOLN
INTRAMUSCULAR | Status: AC
Start: 2012-12-13 — End: 2012-12-13
  Administered 2012-12-13: 50 ug via INTRAVENOUS
  Filled 2012-12-13: qty 2

## 2012-12-13 MED ORDER — SODIUM CHLORIDE 0.9 % IV SOLN
INTRAVENOUS | Status: AC
Start: 2012-12-13 — End: 2012-12-13
  Filled 2012-12-13: qty 100

## 2012-12-13 MED ORDER — LIDOCAINE HCL 1 % IJ SOLN
INTRAMUSCULAR | Status: AC
Start: 2012-12-13 — End: 2012-12-13
  Administered 2012-12-13: 10 mg via SUBCUTANEOUS
  Filled 2012-12-13: qty 20

## 2012-12-13 NOTE — Progress Notes (Signed)
Completed assessment; Tele placed; IV placed and blood sent to lab; Denies any pain at this time; Call bell within reach; Family at bedside

## 2012-12-13 NOTE — Progress Notes (Signed)
Pt transported to RM via stretcher by transport staff with all belongings; Right groin unremarkable

## 2012-12-13 NOTE — Progress Notes (Signed)
Hemostasis established; Reviewed motion restrictions with pt, voices understanding; Drink given; Call bell within reach; Denies any pain at this time

## 2012-12-13 NOTE — H&P (Signed)
Please see my updated recent office note

## 2012-12-13 NOTE — Progress Notes (Signed)
In for sheath removal

## 2012-12-13 NOTE — Progress Notes (Signed)
Pt admitted to 323 contineu top monitor right groin  V/s continue at 30 minutes meal obtained offered

## 2012-12-13 NOTE — Progress Notes (Signed)
Called report to receiving nurse Izzy for RM 323; pt aware of transfer to RM

## 2012-12-13 NOTE — Progress Notes (Signed)
Pt arrived to cath holding unit, gait steady; No acute distress noted; Family at side; Denies any pain at this time

## 2012-12-13 NOTE — Progress Notes (Signed)
Report given to Chip Daniels and pt to cath lab via stretcher

## 2012-12-13 NOTE — Progress Notes (Signed)
Pt back to RM via stretcher; No acute distress noted; Tele placed; IVF infusing and Angiomax discontinued; Pt awake and alert verbalizing without difficulty; Denies any pain at this time; Reviewed motion restrictions with pt, voices understanding; Bed in low position and siderails up x2; Call bell within reach

## 2012-12-14 LAB — BASIC METABOLIC PANEL
Anion Gap: 9.2 mMol/L (ref 7.0–18.0)
BUN / Creatinine Ratio: 19.8 Ratio (ref 10.0–30.0)
BUN: 16 mg/dL (ref 7–22)
CO2: 28.7 mMol/L (ref 20.0–30.0)
Calcium: 8.8 mg/dL (ref 8.5–10.5)
Chloride: 105 mMol/L (ref 98–110)
Creatinine: 0.81 mg/dL (ref 0.80–1.30)
EGFR: >60 mL/min/{1.73_m2}
Glucose: 222 mg/dL — ABNORMAL HIGH (ref 70–99)
Osmolality Calc: 286 mOsm/kg (ref 275–300)
Potassium: 3.9 mMol/L (ref 3.5–5.3)
Sodium: 139 mMol/L (ref 136–147)

## 2012-12-14 LAB — VH DEXTROSE STICK GLUCOSE
Glucose POCT: 157 mg/dL — ABNORMAL HIGH (ref 70–99)
Glucose POCT: 258 mg/dL — ABNORMAL HIGH (ref 70–99)

## 2012-12-14 MED ORDER — ATORVASTATIN CALCIUM 10 MG PO TABS
40.0000 mg | ORAL_TABLET | Freq: Every evening | ORAL | Status: DC
Start: 2012-12-14 — End: 2016-04-27

## 2012-12-14 MED ORDER — PRASUGREL HCL 5 MG PO TABS
10.0000 mg | ORAL_TABLET | Freq: Every day | ORAL | Status: DC
Start: 2012-12-14 — End: 2016-04-27

## 2012-12-14 MED ORDER — FENOFIBRATE 48 MG PO TABS
96.0000 mg | ORAL_TABLET | Freq: Every day | ORAL | Status: DC
Start: 2012-12-14 — End: 2016-04-27

## 2012-12-14 NOTE — Progress Notes (Signed)
Pt is awake and alert.  Receptive to Education. Cardiac Rehab referral received. Reviewed risk factors for Heart Disease, Signs and symptoms of a Heart attach.  Brochure and contact information for phase II program at [facility] provided.  Provided Handout ""All your Heart needs"  Booklet.

## 2012-12-14 NOTE — Progress Notes (Signed)
Daily assessment completed this ma. Pt. up in bed. No acute distress noted.

## 2012-12-14 NOTE — Plan of Care (Signed)
Problem: Safety  Goal: Patient will be free from injury during hospitalization  Outcome: Progressing  Pt educated on call bell. Will continue to monitor

## 2012-12-14 NOTE — Progress Notes (Signed)
Pt resting in bed. Rates pain 2/10. Denies SOB or decrease in Right leg sensation. VSS. Assessment complete. Frequent vitals in place. Right groin cdi, no hematoma. IVF infusing per orders. CBIR. Will continue to monitor.

## 2012-12-14 NOTE — Progress Notes (Signed)
Monitor off pt. Right FA int [place], bandaide on site, no complications noted. Discharge orders received. Pt. And family given and verified understanding of discharge and medication education. Pt. Has all personal items, discharge paperwork, and prescriptions in possession.

## 2012-12-14 NOTE — Progress Notes (Signed)
[facility]  Progress Note    Date Time: 12/14/2012 7:51 AM  Patient Name: Michael [name], Michael [name]    Subjective:   Denies chest pain, SOB, palpitations. Groin site looks good. Has been out of bed  ambulating with no issues    Physical Exam:     Filed Vitals:    12/14/12 0750   BP: 112/76   Pulse: 84   Temp: 98.1 F (36.7 C)   Resp: 18   SpO2: 91%     Temp (24hrs), Avg:97.8 F (36.6 C), Min:97.5 F (36.4 C), Max:98.1 F (36.7 C)      Intake/Output Summary (Last 24 hours) at 12/14/12 0751  Last data filed at 12/14/12 0600   Gross per 24 hour   Intake   2650 ml   Output    400 ml   Net   2250 ml       General appearance - alert, well appearing, and in no distress  Neck - supple, Carotids 2+, no bruit, no JVD  Chest - clear to auscultation, no wheezes, rales or rhonchi, symmetric air entry  Heart - normal rate and regular rhythm, S1 and S2 normal, no murmurs  Abdomen - soft, nontender, nondistended, no masses or organomegaly  Extremities - peripheral pulses normal, no pedal edema, no clubbing or cyanosis    Medications:        . sodium chloride 100 mL/hr at 12/14/12 0100     Current Facility-Administered Medications   Medication Dose Route Frequency   . [COMPLETED] 0.9% sodium chloride with heparin       . amitriptyline  100 mg Oral QHS   . aspirin  81 mg Oral Daily   . atorvastatin  10 mg Oral QHS   . [COMPLETED] bivalirudin       . famotidine  20 mg Oral QHS   . [COMPLETED] fentaNYL       . glimepiride  8 mg Oral QAM AC   . [COMPLETED] heparin 82956 units/1000 mL sodium chloride 0.9%       . insulin detemir  60 Units Subcutaneous QHS   . isosorbide mononitrate  30 mg Oral Daily   . [COMPLETED] lidocaine       . metoprolol  100 mg Oral BID   . [COMPLETED] midazolam       . [COMPLETED] nitroglycerin       . [COMPLETED] prasugrel HCl       . sitaGLIPtin  100 mg Oral Daily   . [COMPLETED] sodium chloride       . [COMPLETED] sterile water (preservative free)       . traMADol  50 mg Oral Q6H      Prior to Admission medications    Medication Sig Start Date End Date Taking? Authorizing Provider   amitriptyline (ELAVIL) 100 MG tablet Take 100 mg by mouth nightly.   Yes [provider]   aspirin EC 81 MG EC tablet Take 81 mg by mouth daily. Took 4 81mg  ASA this AM   Yes [provider]   atorvastatin (LIPITOR) 10 MG tablet Take 10 mg by mouth nightly.   Yes [provider]   clopidogrel (PLAVIX) 75 mg tablet Take 75 mg by mouth daily.   Yes [provider]   cyclobenzaprine (FLEXERIL) 10 MG tablet Take 10 mg by mouth every 8 (eight) hours as needed.   Yes [provider]   glimepiride (AMARYL) 4 MG tablet Take 8 mg by mouth every  morning before breakfast.   Yes [provider]   hydrochlorothiazide (HYDRODIURIL) 12.5 MG tablet Take 12.5 mg by mouth daily.   Yes [provider]   ibuprofen (ADVIL,MOTRIN) 800 MG tablet Take 800 mg by mouth every 8 (eight) hours as needed.   Yes [provider]   insulin detemir (LEVEMIR) 100 UNIT/ML injection Inject 60 Units into the skin nightly.   Yes [provider]   isosorbide mononitrate (IMDUR) 30 MG 24 hr tablet Take 30 mg by mouth daily.   Yes [provider]   METOPROLOL TARTRATE PO Take 100 mg by mouth 2 (two) times daily.   Yes [provider]   ranitidine (ZANTAC) 300 MG tablet Take 300 mg by mouth daily.   Yes [provider]   sitaGLIPtin (JANUVIA) 100 MG tablet Take 100 mg by mouth daily.   Yes [provider]   TraMADol HCl 50 MG Tablet Dispersible Take 50 mg by mouth every 6 (six) hours as needed.   Yes [provider]         Labs:   Recent CMP   Lab 12/14/12 0404   GLU 222*   BUN 16   CREAT 0.81   NA 139   K 3.9   MG --   CO2 28.7   CA 8.8   AST --   ALT --   BILITOTAL --   GLOB --       Recent CARDIAC ENZYMES No results found for this basename: TROPI:10,CK:10,CKMB:10,CKMBINDEX:10,BNP:10 in the last 168 hours    Recent TSH No results  found for this basename: TSH:10 in the last 168 hours    Recent PT/PTT No results found for this basename: PT:10,INR:10,APTT:10 in the last 168 hours    Recent CBC WITH DIFF   Lab 12/13/12 0915   WBC 7.0   RBC 5.02   HGB 15.2   HCT 41.6   MCV 83   MPV 6.9   PLT 228       Recent LIPID PANEL  Cholesterol (mg/dL)   Date Value   8/41/3244 126    10/20/2011 155     Triglycerides (mg/dL)   Date Value   0/01/2724 678*   10/20/2011 286*    HDL (mg/dL)   Date Value   3/66/4403 24*   10/20/2011 32*    LDL Calculated (mg/dL)   Date Value   4/74/2595 Unable to calculate due to triglycerides >400 mg/dl    09/19/8754 66         Rads:     Radiology Results (24 Hour)     ** No Results found for the last 24 hours. **            Problem List:   Active Problems:   CAD (coronary artery disease)       Assessment:     [name] is 57 y.o. male with CAD. Exertional CP with severely abnormal   Stress test. S/P POBA and DES placement into OM2    Plan:   1. Discharge to home.  2. Effient 10 mg daily in place of Plavix  3. Will increase Lipitor to 40 mg daily and add Tricor for elevated triglycerides.  4. F/U with me in office in couple weeks and PCP in a week    Orest Dikes, MD

## 2012-12-14 NOTE — Discharge Instructions (Signed)
Cardiac Catheterization, Bleeding Or Hematoma After   You recently had cardiac catheterization. A catheter was put into your body through a puncture site in your groin or arm. You now have bleeding from this site. When bleeding occurs, it may drip or spurt from the site. Or it may collect in a lump (hematoma) under the skin. In the emergency department, pressure will be put on the site to stop bleeding. You will be sent home when the bleeding stops. To prevent repeat bleeding, take some precautions at home. If the bleeding starts again, follow the advice below.    Home Care  For The Next 48 Hours:    Do not do any strenuous activity.   Avoid climbing stairs.   Do not lift anything heavy.   If the puncture site is in your arm or wrist, do not lie on that arm.   If your puncture site is in the groin, avoid straining at bowel movements.   Do not scrub the site when bathing. It is fine to get it wet after your doctor says it is okay, but do not scrub it or massage it.  If Bleeding Occurs Again:    Lie on your back.   Place a clean cloth or gauze over the puncture site. Then hold firm pressure right on the site. Have someone apply firm pressure with a gauze pad or a clean washcloth. (If the site in on your groin, have someone else hold pressure on the site. If you don't have someone to do this, put a 5-pound bag of rice or flour on the site and apply pressure with your hand over the bag of rice.)   Hold pressure for at least 10 to 15 minutes or until the bleeding stops.   Once the bleeding has stopped, you can release the pressure. But stay lying on your back with your arm or leg straight for 60 minutes.   Call emergency services if bleeding is severe or does not stop within 60 minutes.  Follow Up  as advised by the doctor or our staff.  Get Prompt Medical Attention  if any of the following occurs:   Bleeding from the site does not stop with pressure and lying flat for 1 hour   Increased pain, redness,  swelling, or drainage from the puncture site   Coolness, numbness, tingling, or skin color changes in the leg or arm that had the puncture site   Chest pain or pressure   Nausea or vomiting   Feeling weak or faint   Hematoma (lump) is constantly or quickly getting larger   [address], [address] [place], [place] [address]. All rights reserved. This information is not intended as a substitute for professional medical care. Always follow your healthcare professional's instructions.            Understanding Coronary Artery Disease (CAD)  To understand coronary artery disease (CAD), you need to know how your heart works. Your heart is a muscle that pumps blood throughout your body. To work right, your heart needs a steady supply of oxygen. It gets this oxygen from blood supplied by the coronary arteries.       Healthy Artery      Damaged Artery      Narrowed Artery      Blocked Artery     Healthy artery. When a coronary artery is healthy and has no blockages, blood flows through easily. Healthy arteries can easily supply the oxygen-rich blood your heart  needs.  Damaged artery. Coronary artery disease begins when damage leads to the development of fatty streaks that progress into plaque (a fatty substance)building up within the artery wall. This damage could be caused by things like high blood pressure or smoking. This plaque buildup,called atherosclerosis,begins to narrow the arteries carrying blood to the heart.  Narrowed artery. As more plaque builds up, your artery has trouble supplying blood to your heart muscle when it needs it most, such as during exercise. You may not feel any symptoms when this happens. Or you may feel angina--pressure, tightness, achiness, or pain in your chest, jaw, neck, back, or arm.  Blocked artery. Plaque may tear, completely blocking the artery. Or a blood clot may plug the narrowed opening. When this happens, blood flow stops. Without oxygen-rich blood, part  of the heart muscle becomes damaged and stops working. You may feel crushing pressure or pain in or around your chest. This is a heart attack (acute myocardial infarction, or AMI).   [address] [place], [place] [address]. All rights reserved. This information is not intended as a substitute for professional medical care. Always follow your healthcare professional's instructions.      Risk Factors for Heart Disease  A risk factor is something that increases your chance of having heart disease. Heart disease (also called coronary artery disease) involves damage to arteries, blood vessels that carry oxygen-rich blood through your body. Things like smoking or unhealthy cholesterol levels can damage arteries. You can't control some risk factors, such as age and a family history of heart disease. But most, including those listed below, are things you can control.  Unhealthy Cholesterol Levels  Cholesterol is a fatty substance in your blood. It can build up inside your arteries and block the blood flow to your heart or brain. Your risk ofheart disease goes up if you have high levels of LDL ("bad") cholesterol or triglycerides (another substance that can build up) You're also at risk if you don't have enough HDL cholesterol ("good") cholesterol that clears the bad cholesterol away.  Smoking  This is the most important risk factor you can change. Smoking damages your arteries. It reduces blood flow to your heart and brain. It greatly increases your riskof heart disease, stroke, lung disease, and cancer. If you smoke, you are two to four times more likely to develop coronary artery disease.  High Blood Pressure  High blood pressure occurs when blood pushes too hard against artery walls as it passes through the arteries. This damages the artery lining. High blood pressure raises your risk of heart attack, also known as acute myocardial infarction, or AMI, and especially stroke.  Negative  Emotions  Stress, pent-up anger, and other negative emotions have been linked to heart disease. Over time, these emotions could raise your heart disease risk.  Metabolic Syndrome  This is caused by a combination of certain risk factors. It puts you at extra high risk of heart disease, stroke, and diabetes. You have metabolic syndrome if you have three or more of the following: low HDL cholesterol; high triglycerides; high blood pressure; high blood sugar; extra weight around the waist.  Diabetes  Diabetes occurs when you have high levels of sugar (glucose) in your blood. This can damage arteries if not kept under control. Having diabetes also makes you more likely to have a silent heart attack--one without any symptoms.  Excess Weight  Excess weight makes other risk factors, such as diabetes, more likely. Excess weight around  the waist or stomach increases your heart disease risk the most.  Lack of Physical Activity  When you're not active, you're more likely to develop diabetes, high blood pressure, abnormal cholesterol levels, and excess weight.    Most people with heart disease have more than one risk factor.    [address] [place], [place] [address]. All rights reserved. This information is not intended as a substitute for professional medical care. Always follow your healthcare professional's instructions.    onal's instructions.

## 2012-12-14 NOTE — Discharge Summary (Signed)
DISCHARGE NOTE    Date Time: 12/14/2012 8:00 AM  Patient Name: Michael [name]  Attending Physician: Orest Dikes, MD    Date of Admission:   12/13/2012    Date of Discharge:   12/14/12    Reason for Admission:   Coronary atherosclerosis of native coronary artery [414.01]  Other and unspecified hyperlipidemia [272.4]  Essential hypertension, benign [401.1]  Cerebrovascular disease, unspecified [437.9]  CAD (coronary artery disease)    Problems:   Lists the present on admission hospital problems  Present on Admission:   . CAD (coronary artery disease)    Hospital Problems:  Active Problems:   CAD (coronary artery disease)      Problem Lists:  Patient Active Problem List   Diagnosis   . CAD (coronary artery disease)           Discharge Dx:   1. 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 LHC with coronary angio   2. POBA and PCI to OM2 branck of LCx    Hospital Course:   Admitted for the above procedures for symptoms of crescendo angina and severely  Abnormal stress test. Doing well this AM. Groin site looks good. Has been out of bed  And ambulating. Plavix is changed to Effient 10 mg daily. Lipitor increased to 40 mg  Daily and tricor 96 mg daily added to the home meds. F/U with me at [facility] in couple  Weeks and PCP in a week. Liver functions will need to be followed up on given statin  And fenofibrate.     Discharge Medications:     Current Discharge Medication List      START taking these medications    Details   fenofibrate (TRICOR) 48 MG tablet Take 2 tablets (96 mg total) by mouth daily.  Qty: 30 tablet, Refills: 3      prasugrel (EFFIENT) 5 MG Tab Take 2 tablets (10 mg total) by mouth daily.  Qty: 30 tablet, Refills: 11         CONTINUE these medications which have CHANGED    Details   atorvastatin (LIPITOR) 10 MG tablet Take 4 tablets (40 mg total) by mouth nightly.  Qty: 30 tablet, Refills: 3         CONTINUE these  medications which have NOT CHANGED    Details   amitriptyline (ELAVIL) 100 MG tablet Take 100 mg by mouth nightly.      aspirin EC 81 MG EC tablet Take 81 mg by mouth daily. Took 4 81mg  ASA this AM      cyclobenzaprine (FLEXERIL) 10 MG tablet Take 10 mg by mouth every 8 (eight) hours as needed.      glimepiride (AMARYL) 4 MG tablet Take 8 mg by mouth every morning before breakfast.      hydrochlorothiazide (HYDRODIURIL) 12.5 MG tablet Take 12.5 mg by mouth daily.      ibuprofen (ADVIL,MOTRIN) 800 MG tablet Take 800 mg by mouth every 8 (eight) hours as needed.      insulin detemir (LEVEMIR) 100 UNIT/ML injection Inject 60 Units into the skin nightly.      METOPROLOL TARTRATE PO Take 100 mg by mouth 2 (two) times daily.      ranitidine (ZANTAC) 300 MG tablet Take 300 mg by mouth daily.      sitaGLIPtin (JANUVIA) 100 MG tablet Take 100 mg by mouth daily.  TraMADol HCl 50 MG Tablet Dispersible Take 50 mg by mouth every 6 (six) hours as needed.         STOP taking these medications       clopidogrel (PLAVIX) 75 mg tablet        isosorbide mononitrate (IMDUR) 30 MG 24 hr tablet                Discharge Instructions:   1. Shower only only for the next 2-3 days  2. No lifting more than 5 lbs for next 2-3 days  3. Avoid steps and driving for next 2-3 days if possible       Signed by: Orest Dikes

## 2012-12-26 DIAGNOSIS — Z87828 Personal history of other (healed) physical injury and trauma: Secondary | ICD-10-CM

## 2012-12-26 HISTORY — DX: Personal history of other (healed) physical injury and trauma: Z87.828

## 2013-02-28 IMAGING — CR DG SACRUM/COCCYX 2+V
3 series · 3 of 3 positions shown · non-contrast
Comparison: The lumbar spine series 07/17/2011

CLINICAL DATA: Chest pain, back pain.  Fall.  Numbness in left leg.

SACRUM AND COCCYX - 2+ VIEW

[t sacrum ap]
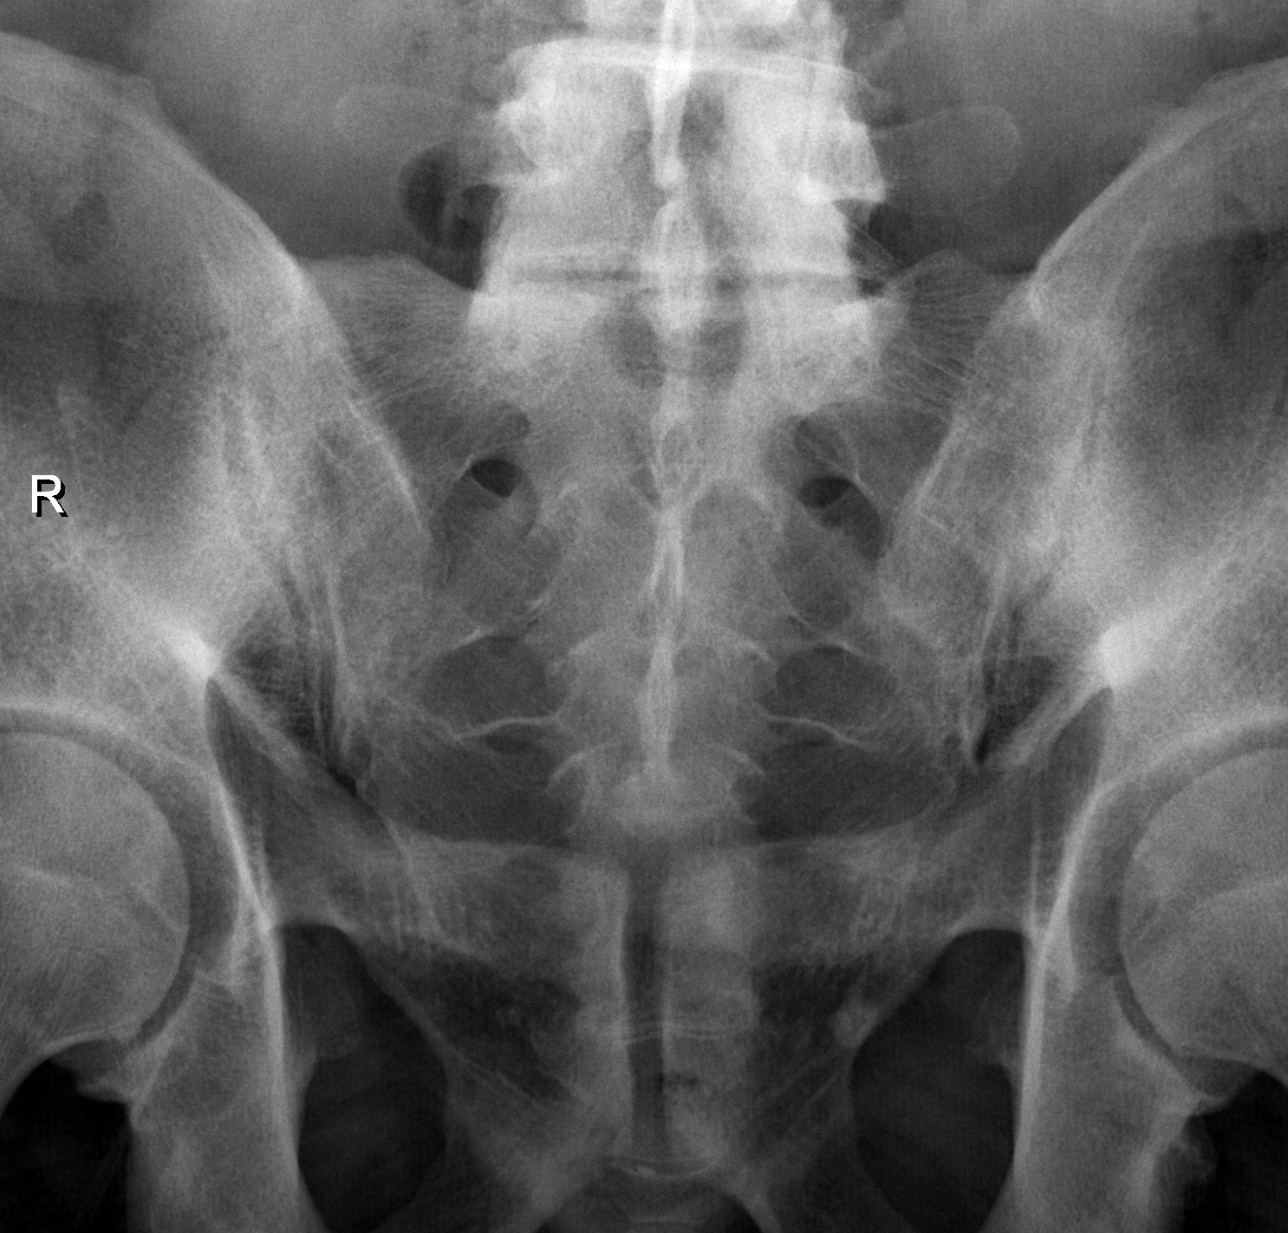

[t coccyx ap]
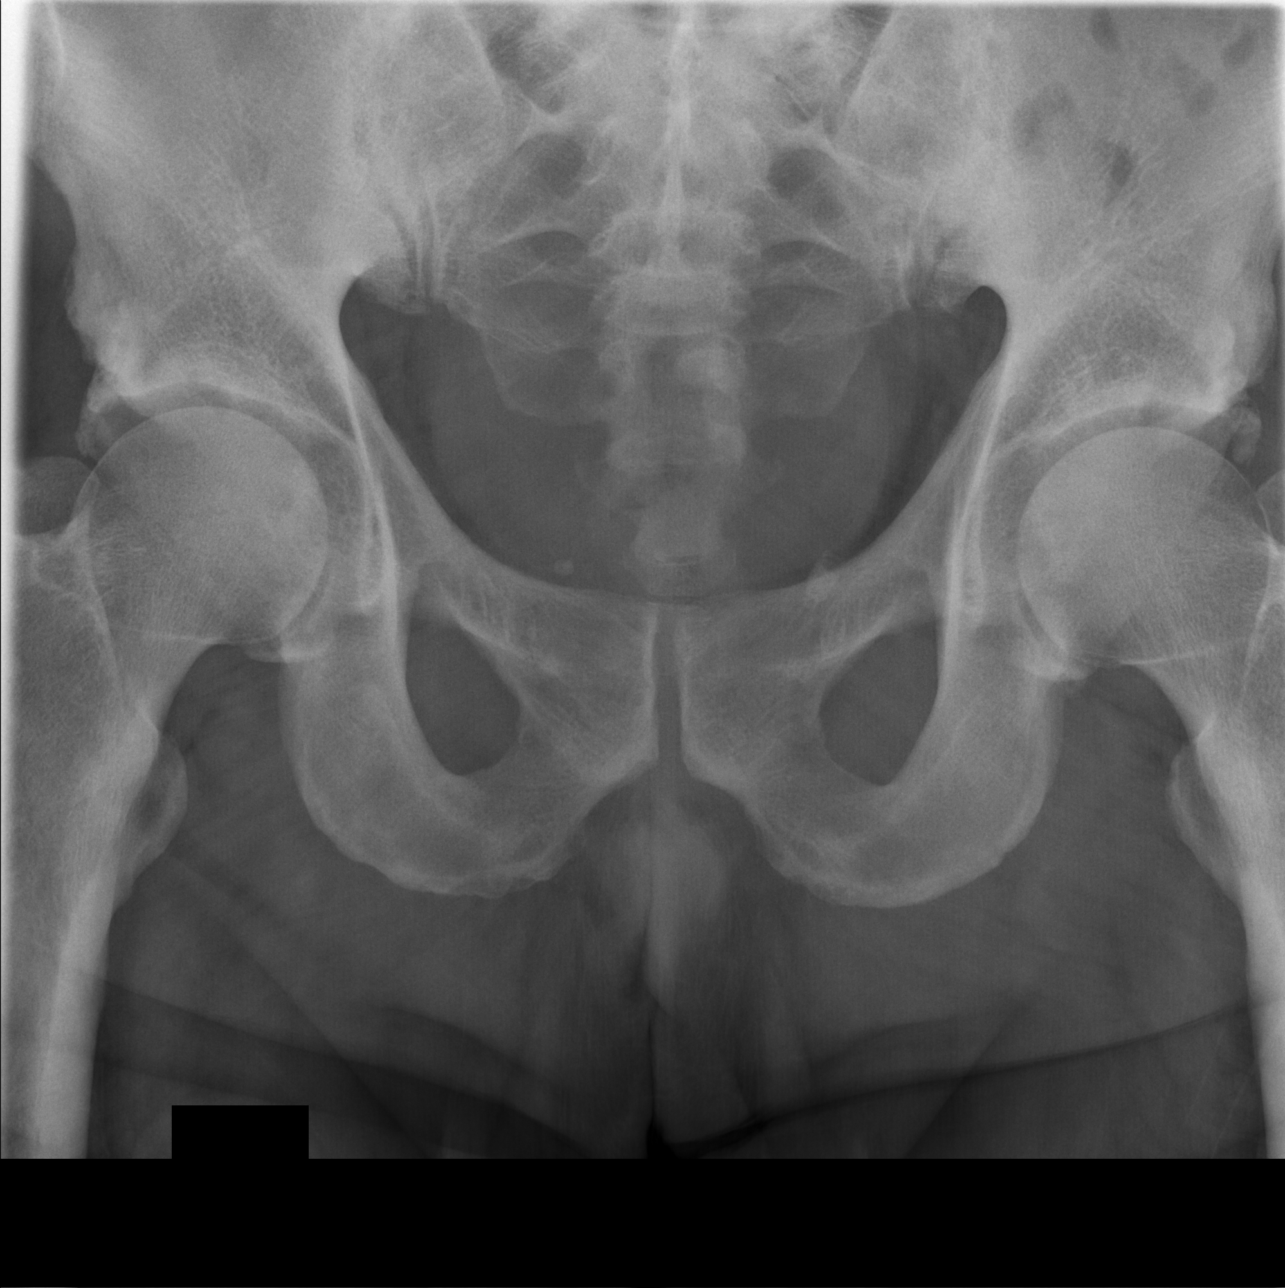

[t sacrum coccyx lat]
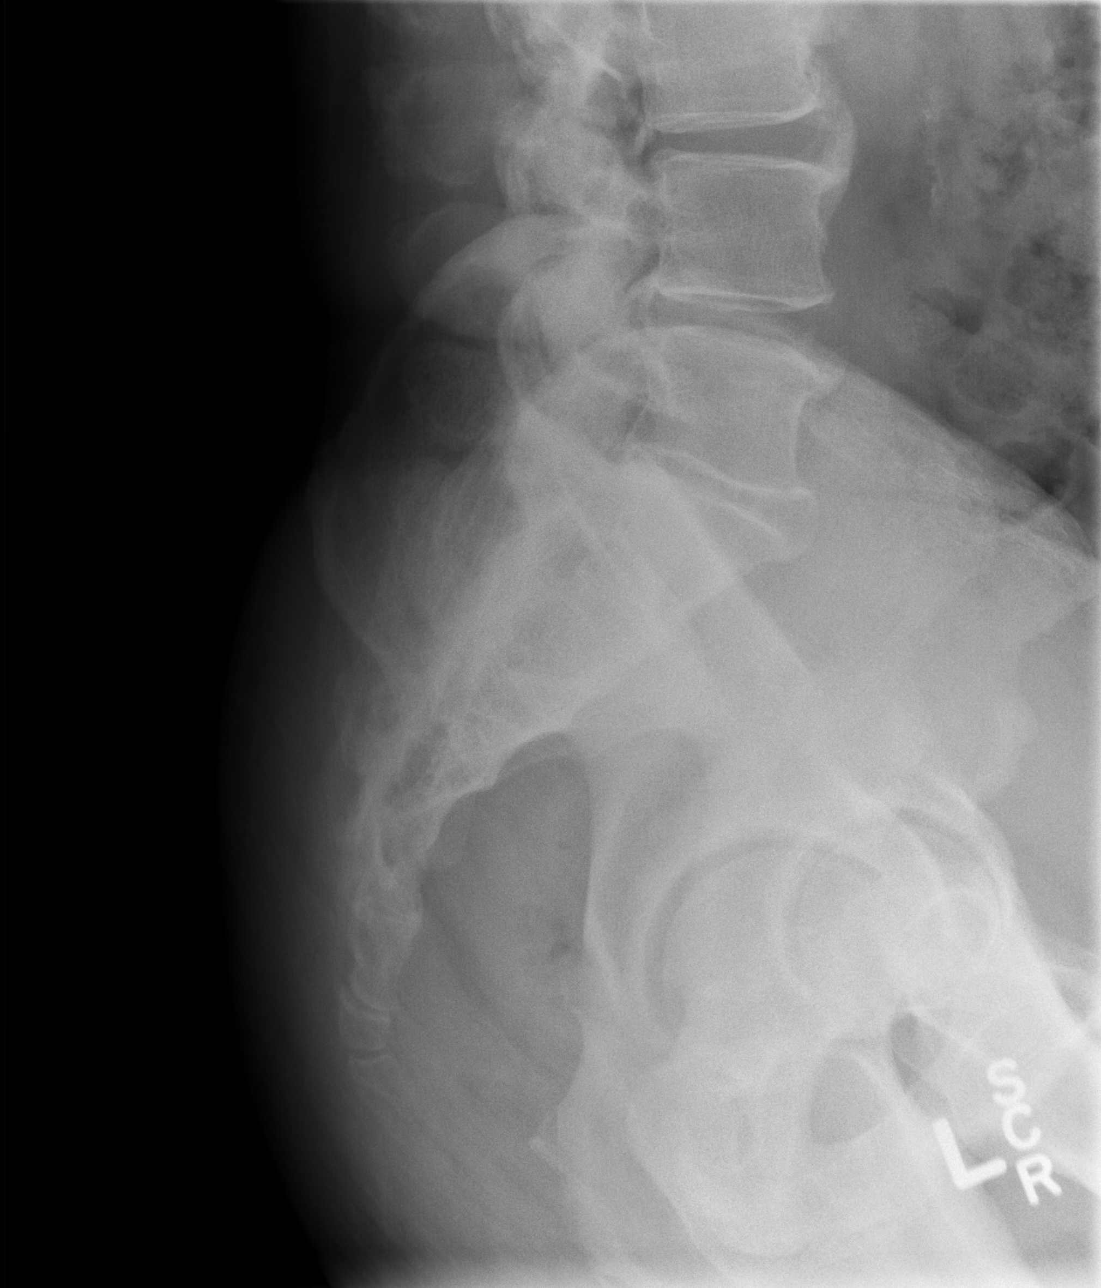

[3 of 3 positions shown; findings below may reference images not displayed]

FINDINGS: No acute bony abnormality.  No evidence of sacral or
coccygeal fracture.  Degenerative changes in the lower lumbar spine
and hips.
IMPRESSION: No acute bony abnormality.

## 2013-03-18 ENCOUNTER — Observation Stay: Payer: BC Managed Care – PPO

## 2013-03-18 ENCOUNTER — Observation Stay
Admission: EM | Admit: 2013-03-18 | Discharge: 2013-03-19 | Disposition: A | Discharge status: Home / Self Care | Payer: BC Managed Care – PPO | Attending: Emergency Medicine | Admitting: Emergency Medicine

## 2013-03-18 ENCOUNTER — Emergency Department: Payer: BC Managed Care – PPO

## 2013-03-18 ENCOUNTER — Observation Stay: Payer: BC Managed Care – PPO | Admitting: Emergency Medicine

## 2013-03-18 DIAGNOSIS — E119 Type 2 diabetes mellitus without complications: Secondary | ICD-10-CM | POA: Insufficient documentation

## 2013-03-18 DIAGNOSIS — Z8673 Personal history of transient ischemic attack (TIA), and cerebral infarction without residual deficits: Secondary | ICD-10-CM | POA: Insufficient documentation

## 2013-03-18 DIAGNOSIS — Z7982 Long term (current) use of aspirin: Secondary | ICD-10-CM | POA: Insufficient documentation

## 2013-03-18 DIAGNOSIS — Z888 Allergy status to other drugs, medicaments and biological substances status: Secondary | ICD-10-CM | POA: Insufficient documentation

## 2013-03-18 DIAGNOSIS — R079 Chest pain, unspecified: Principal | ICD-10-CM | POA: Insufficient documentation

## 2013-03-18 DIAGNOSIS — Z823 Family history of stroke: Secondary | ICD-10-CM | POA: Insufficient documentation

## 2013-03-18 DIAGNOSIS — Z8249 Family history of ischemic heart disease and other diseases of the circulatory system: Secondary | ICD-10-CM | POA: Insufficient documentation

## 2013-03-18 DIAGNOSIS — Z881 Allergy status to other antibiotic agents status: Secondary | ICD-10-CM | POA: Insufficient documentation

## 2013-03-18 DIAGNOSIS — Z885 Allergy status to narcotic agent status: Secondary | ICD-10-CM | POA: Insufficient documentation

## 2013-03-18 DIAGNOSIS — E669 Obesity, unspecified: Secondary | ICD-10-CM | POA: Insufficient documentation

## 2013-03-18 DIAGNOSIS — E785 Hyperlipidemia, unspecified: Secondary | ICD-10-CM | POA: Insufficient documentation

## 2013-03-18 DIAGNOSIS — I2510 Atherosclerotic heart disease of native coronary artery without angina pectoris: Secondary | ICD-10-CM | POA: Insufficient documentation

## 2013-03-18 DIAGNOSIS — I10 Essential (primary) hypertension: Secondary | ICD-10-CM | POA: Insufficient documentation

## 2013-03-18 DIAGNOSIS — Z9861 Coronary angioplasty status: Secondary | ICD-10-CM | POA: Insufficient documentation

## 2013-03-18 DIAGNOSIS — Z6830 Body mass index (BMI) 30.0-30.9, adult: Secondary | ICD-10-CM | POA: Insufficient documentation

## 2013-03-18 DIAGNOSIS — Z833 Family history of diabetes mellitus: Secondary | ICD-10-CM | POA: Insufficient documentation

## 2013-03-18 DIAGNOSIS — Z794 Long term (current) use of insulin: Secondary | ICD-10-CM | POA: Insufficient documentation

## 2013-03-18 DIAGNOSIS — K219 Gastro-esophageal reflux disease without esophagitis: Secondary | ICD-10-CM | POA: Insufficient documentation

## 2013-03-18 DIAGNOSIS — I252 Old myocardial infarction: Secondary | ICD-10-CM | POA: Insufficient documentation

## 2013-03-18 HISTORY — DX: Presence of coronary angioplasty implant and graft: Z95.5

## 2013-03-18 LAB — VH DEXTROSE STICK GLUCOSE
Glucose POCT: 239 mg/dL — ABNORMAL HIGH (ref 70–99)
Glucose POCT: 194 mg/dL — ABNORMAL HIGH (ref 70–99)
Glucose POCT: 240 mg/dL — ABNORMAL HIGH (ref 70–99)

## 2013-03-18 LAB — CBC AND DIFFERENTIAL
Basophils %: 0.9 % (ref 0.0–3.0)
Basophils Absolute: 0 10*3/uL (ref 0.0–0.3)
Eosinophils %: 3.6 % (ref 0.0–7.0)
Eosinophils Absolute: 0.2 10*3/uL (ref 0.0–0.8)
Hematocrit: 36.2 % — ABNORMAL LOW (ref 39.0–52.5)
Hemoglobin: 13.3 gm/dL (ref 13.0–17.5)
Lymphocytes Absolute: 2 10*3/uL (ref 0.6–5.1)
Lymphocytes: 40.8 % (ref 15.0–46.0)
MCH: 31 pg (ref 28–35)
MCHC: 37 gm/dL — ABNORMAL HIGH (ref 32–36)
MCV: 83 fL (ref 80–100)
MPV: 7.1 fL (ref 6.0–10.0)
Monocytes Absolute: 0.4 10*3/uL (ref 0.1–1.7)
Monocytes: 7.9 % (ref 3.0–15.0)
Neutrophils %: 46.8 % (ref 42.0–78.0)
Neutrophils Absolute: 2.3 10*3/uL (ref 1.7–8.6)
PLT CT: 204 10*3/uL (ref 130–440)
RBC: 4.35 10*6/uL (ref 4.00–5.70)
RDW: 11.6 % (ref 11.0–14.0)
WBC: 5 10*3/uL (ref 4.0–11.0)

## 2013-03-18 LAB — ECG 12-LEAD
Interpretation Text: BORDERLINE
P Wave Axis: 51 deg
P Wave Duration: 114 ms
P-R Interval: 206 ms
Patient Age: 57 years
Q-T Dispersion: 76 ms
Q-T Interval(Corrected): 427 ms
Q-T Interval: 390 ms
QRS Axis: 33 deg
QRS Duration: 78 ms
T Axis: 4 deg
Ventricular Rate: 72 /min

## 2013-03-18 LAB — COMPREHENSIVE METABOLIC PANEL
ALT: 30 U/L (ref 0–55)
AST (SGOT): 18 U/L (ref 10–42)
Albumin/Globulin Ratio: 1.28 Ratio (ref 0.70–1.50)
Albumin: 3.7 gm/dL (ref 3.5–5.0)
Alkaline Phosphatase: 51 U/L (ref 40–145)
Anion Gap: 11.2 mMol/L (ref 7.0–18.0)
BUN / Creatinine Ratio: 18.1 Ratio (ref 10.0–30.0)
BUN: 17 mg/dL (ref 7–22)
Bilirubin, Total: 1.4 mg/dL — ABNORMAL HIGH (ref 0.1–1.2)
CO2: 26.1 mMol/L (ref 20.0–30.0)
Calcium: 9.5 mg/dL (ref 8.5–10.5)
Chloride: 105 mMol/L (ref 98–110)
Creatinine: 0.94 mg/dL (ref 0.80–1.30)
EGFR: >60 mL/min/{1.73_m2}
Globulin: 2.9 gm/dL (ref 2.0–4.0)
Glucose: 401 mg/dL — ABNORMAL HIGH (ref 70–99)
Osmolality Calc: 294 mOsm/kg (ref 275–300)
Potassium: 4.3 mMol/L (ref 3.5–5.3)
Protein, Total: 6.6 gm/dL (ref 6.0–8.3)
Sodium: 138 mMol/L (ref 136–147)

## 2013-03-18 LAB — PT AND APTT
PT INR: 1.2 (ref 0.5–1.3)
PT: 12 s — ABNORMAL HIGH (ref 9.5–11.5)
aPTT: 22.6 s — ABNORMAL LOW (ref 24.0–34.0)

## 2013-03-18 LAB — VH CARDIAC PROFILE
Creatine Kinase (CK): 192 U/L (ref 30–230)
Creatinine Kinase MB (CKMB): 5.6 ng/mL (ref 0.1–6.0)

## 2013-03-18 LAB — VH CARDIAC PROF.WITH TROPONIN
Creatine Kinase (CK): 165 U/L (ref 30–230)
Creatinine Kinase MB (CKMB): 4.8 ng/mL (ref 0.1–6.0)
Troponin I: 0 ng/mL (ref 0.00–0.02)

## 2013-03-18 LAB — TROPONIN I: Troponin I: 0 ng/mL (ref 0.00–0.02)

## 2013-03-18 MED ORDER — INSULIN ASPART 100 UNIT/ML SC SOPN
1.0000 [IU] | PEN_INJECTOR | Freq: Four times a day (QID) | SUBCUTANEOUS | Status: DC
Start: 2013-03-18 — End: 2013-03-19
  Administered 2013-03-18: 2 [IU] via SUBCUTANEOUS
  Filled 2013-03-18: qty 3

## 2013-03-18 MED ORDER — ASPIRIN 81 MG PO CHEW
81.0000 mg | CHEWABLE_TABLET | Freq: Every day | ORAL | Status: DC
Start: 2013-03-19 — End: 2013-03-19
  Administered 2013-03-19: 324 mg via ORAL
  Filled 2013-03-18: qty 4

## 2013-03-18 MED ORDER — ACETAMINOPHEN 500 MG PO TABS
1000.0000 mg | ORAL_TABLET | Freq: Once | ORAL | Status: AC
Start: 2013-03-18 — End: 2013-03-18
  Administered 2013-03-18: 1000 mg via ORAL

## 2013-03-18 MED ORDER — ASPIRIN 81 MG PO CHEW
243.0000 mg | CHEWABLE_TABLET | Freq: Once | ORAL | Status: DC
Start: 2013-03-18 — End: 2013-03-18

## 2013-03-18 MED ORDER — HYDROCHLOROTHIAZIDE 25 MG PO TABS
12.5000 mg | ORAL_TABLET | Freq: Every day | ORAL | Status: DC
Start: 2013-03-18 — End: 2013-03-18
  Filled 2013-03-18: qty 1

## 2013-03-18 MED ORDER — INSULIN GLARGINE 100 UNIT/ML SC SOPN
10.0000 [IU] | PEN_INJECTOR | Freq: Every evening | SUBCUTANEOUS | Status: DC
Start: 2013-03-18 — End: 2013-03-19
  Administered 2013-03-18: 10 [IU] via SUBCUTANEOUS
  Filled 2013-03-18: qty 3

## 2013-03-18 MED ORDER — HYDROCHLOROTHIAZIDE 25 MG PO TABS
12.5000 mg | ORAL_TABLET | Freq: Every day | ORAL | Status: DC
Start: 2013-03-19 — End: 2013-03-19
  Administered 2013-03-19: 12.5 mg via ORAL
  Filled 2013-03-18: qty 1

## 2013-03-18 MED ORDER — DEXTROSE 50 % IV SOLN
25.0000 mL | INTRAVENOUS | Status: DC | PRN
Start: 2013-03-18 — End: 2013-03-19

## 2013-03-18 MED ORDER — REGADENOSON 0.4 MG/5ML IV SOLN
INTRAVENOUS | Status: AC
Start: 2013-03-18 — End: ?
  Filled 2013-03-18: qty 5

## 2013-03-18 MED ORDER — SITAGLIPTIN PHOSPHATE 100 MG PO TABS
100.0000 mg | ORAL_TABLET | Freq: Every day | ORAL | Status: DC
Start: 2013-03-19 — End: 2013-03-19
  Administered 2013-03-19: 100 mg via ORAL
  Filled 2013-03-18: qty 1

## 2013-03-18 MED ORDER — GLIMEPIRIDE 2 MG PO TABS
8.0000 mg | ORAL_TABLET | Freq: Every morning | ORAL | Status: DC
Start: 2013-03-19 — End: 2013-03-18

## 2013-03-18 MED ORDER — TRAMADOL HCL 50 MG PO TABS
50.0000 mg | ORAL_TABLET | Freq: Three times a day (TID) | ORAL | Status: DC | PRN
Start: 2013-03-18 — End: 2013-03-19
  Administered 2013-03-18: 50 mg via ORAL
  Filled 2013-03-18: qty 1

## 2013-03-18 MED ORDER — SODIUM CHLORIDE 0.9 % IJ SOLN
3.0000 mL | Freq: Three times a day (TID) | INTRAMUSCULAR | Status: DC
Start: 2013-03-18 — End: 2013-03-19
  Administered 2013-03-18: 3 mL via INTRAVENOUS

## 2013-03-18 MED ORDER — GLUCOSE 40 % PO GEL
15.0000 g | ORAL | Status: DC | PRN
Start: 2013-03-18 — End: 2013-03-19

## 2013-03-18 MED ORDER — TECHNETIUM TC 99M SESTAMIBI - CARDIOLITE
49.1000 | Freq: Once | Status: AC | PRN
Start: 2013-03-18 — End: 2013-03-18
  Administered 2013-03-18: 49.1 via INTRAVENOUS

## 2013-03-18 MED ORDER — FAMOTIDINE 20 MG PO TABS
20.0000 mg | ORAL_TABLET | Freq: Every evening | ORAL | Status: DC
Start: 2013-03-18 — End: 2013-03-19
  Administered 2013-03-18: 20 mg via ORAL
  Filled 2013-03-18 (×2): qty 1

## 2013-03-18 MED ORDER — ISOSORBIDE MONONITRATE ER 30 MG PO TB24
30.0000 mg | ORAL_TABLET | Freq: Every day | ORAL | Status: DC
Start: 2013-03-18 — End: 2013-03-19
  Administered 2013-03-19: 30 mg via ORAL
  Filled 2013-03-18 (×2): qty 1

## 2013-03-18 MED ORDER — GLIPIZIDE 5 MG PO TABS
10.0000 mg | ORAL_TABLET | Freq: Two times a day (BID) | ORAL | Status: DC
Start: 2013-03-18 — End: 2013-03-19
  Administered 2013-03-18 – 2013-03-19 (×2): 10 mg via ORAL
  Filled 2013-03-18 (×3): qty 2

## 2013-03-18 MED ORDER — INSULIN REGULAR HUMAN 100 UNIT/ML IJ SOLN
INTRAMUSCULAR | Status: AC
Start: 2013-03-18 — End: ?
  Filled 2013-03-18: qty 3

## 2013-03-18 MED ORDER — METOPROLOL TARTRATE 25 MG PO TABS
100.0000 mg | ORAL_TABLET | Freq: Two times a day (BID) | ORAL | Status: DC
Start: 2013-03-18 — End: 2013-03-19
  Administered 2013-03-18 – 2013-03-19 (×2): 100 mg via ORAL
  Filled 2013-03-18 (×2): qty 4

## 2013-03-18 MED ORDER — FENOFIBRATE 54 MG PO TABS
54.0000 mg | ORAL_TABLET | Freq: Every day | ORAL | Status: DC
Start: 2013-03-18 — End: 2013-03-19
  Administered 2013-03-18 – 2013-03-19 (×2): 54 mg via ORAL
  Filled 2013-03-18 (×2): qty 1

## 2013-03-18 MED ORDER — NITROGLYCERIN 2 % TD OINT
1.0000 [in_us] | TOPICAL_OINTMENT | Freq: Once | TRANSDERMAL | Status: AC
Start: 2013-03-18 — End: 2013-03-18
  Administered 2013-03-18: 1 [in_us] via TOPICAL

## 2013-03-18 MED ORDER — GLUCAGON HCL (RDNA) 1 MG IJ SOLR
1.0000 mg | INTRAMUSCULAR | Status: DC | PRN
Start: 2013-03-18 — End: 2013-03-19

## 2013-03-18 MED ORDER — ATORVASTATIN CALCIUM 10 MG PO TABS
10.0000 mg | ORAL_TABLET | Freq: Every day | ORAL | Status: DC
Start: 2013-03-18 — End: 2013-03-19
  Administered 2013-03-18 – 2013-03-19 (×2): 10 mg via ORAL
  Filled 2013-03-18 (×2): qty 1

## 2013-03-18 MED ORDER — TECHNETIUM TC 99M SESTAMIBI - CARDIOLITE
15.0000 | Freq: Once | Status: AC | PRN
Start: 2013-03-18 — End: 2013-03-18
  Administered 2013-03-18: 15 via INTRAVENOUS

## 2013-03-18 MED ORDER — PRASUGREL HCL 10 MG PO TABS
10.0000 mg | ORAL_TABLET | Freq: Every day | ORAL | Status: DC
Start: 2013-03-19 — End: 2013-03-19
  Administered 2013-03-19: 10 mg via ORAL
  Filled 2013-03-18: qty 1

## 2013-03-18 MED ORDER — ACETAMINOPHEN 325 MG PO TABS
650.0000 mg | ORAL_TABLET | ORAL | Status: DC | PRN
Start: 2013-03-18 — End: 2013-03-19
  Administered 2013-03-18: 650 mg via ORAL
  Filled 2013-03-18: qty 2

## 2013-03-18 MED ORDER — ACETAMINOPHEN 500 MG PO TABS
ORAL_TABLET | ORAL | Status: AC
Start: 2013-03-18 — End: ?
  Filled 2013-03-18: qty 2

## 2013-03-18 MED ORDER — INSULIN REGULAR HUMAN 100 UNIT/ML IJ SOLN
10.0000 [IU] | Freq: Once | INTRAMUSCULAR | Status: AC
Start: 2013-03-18 — End: 2013-03-18
  Administered 2013-03-18: 10 [IU] via INTRAVENOUS

## 2013-03-18 MED ORDER — NITROGLYCERIN 2 % TD OINT
TOPICAL_OINTMENT | TRANSDERMAL | Status: AC
Start: 2013-03-18 — End: ?
  Filled 2013-03-18: qty 1

## 2013-03-18 MED ORDER — NITROGLYCERIN 0.4 MG SL SUBL
0.4000 mg | SUBLINGUAL_TABLET | SUBLINGUAL | Status: DC | PRN
Start: 2013-03-18 — End: 2013-03-19
  Administered 2013-03-19 (×2): 0.4 mg via SUBLINGUAL
  Filled 2013-03-18: qty 25

## 2013-03-18 MED ORDER — HYDROXYZINE PAMOATE 25 MG PO CAPS
50.0000 mg | ORAL_CAPSULE | Freq: Every evening | ORAL | Status: DC
Start: 2013-03-18 — End: 2013-03-19
  Administered 2013-03-18: 50 mg via ORAL
  Filled 2013-03-18: qty 2

## 2013-03-18 NOTE — Progress Notes (Signed)
Nuclear stress test - 0.4 mg lexiscan injected over 15 seconds; no st changes or cp; pt tried tm but had to stop at 3:44 due to leg cramping; sestamibi [place] [id number]

## 2013-03-18 NOTE — ED Notes (Signed)
Pt to room 20 from ems. Assessed. Placed on cardiac monitor, 2lpm nc, ekg performed and given to Dr. Amie Critchley. Pt reports onset sharp center left ant cp since 0800 10/10 rad to left neck while waiting in office for apt with pain management. Took 3 sl ntg and 4 baby asa pta. On arrival, pt pain 4/10. Non-labored resps. Resting comfortably supine in stretcher. Call bell at bedside. Labs obtained by Sibyl Parr RN, drawn from lac INT placed by EMS. Awaiting eval from physician. No other complaints or requests at this time. Given blanket for neck support per pt request.

## 2013-03-18 NOTE — ED Notes (Signed)
Pt reports headache pain 5/10. Dr. Kirtland Bouchard notified, med ordered for pain

## 2013-03-18 NOTE — H&P (Signed)
[facility]  OBSERVATION UNIT  HISTORY AND PHYSICAL       Patient: Michael [name]  Admission Date: 03/18/2013    DOB: 21-Feb-1956  Age: 57 y.o.    MRN: [id number]  Sex: male    PCP: Alonna Minium, DO  Attending: Kemper Durie MD         HISTORY OF PRESENT ILLNESS     Michael [name] is a 57 y.o. male who presented with chest pain to the ED this morning.  He was sitting in the waiting room at his pain mgmt doctor when he experienced 9/10 sternal CP with radiation to the left neck and shoulder.  The pain required him to take 3 nitroglycerin before resolving.  He had associated nausea and lightheadedness.  He denies diaphoresis or SOB.  He states the pain is similar to CP he had prior to his most recent stent which was placed in August 2013.  He was seen by Dr. Howie Ill at that time.  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  He is an IDDM.  Denies tobacco use.  States he currently has a 4/10 "mild" chest pressure.  He was evaluated in the ED and found to have blood glucose of 400.  Remainder of labs and EKG were unremarkable.  ED attending, Dr. Windell Moulding, spoke with Dr. Howie Ill, who recommended one additional set of enzymes and stress today.  Dr. Howie Ill is agreeable to consulting on the pt in the obs unit.      Obs consultation was requested, the pt was evaluated, and subsequently transferred to the obs unit.        PAST MEDICAL HISTORY     Code Status: Prior    Past Medical History   Diagnosis Date   . Gastroesophageal reflux disease    . Diabetes mellitus    . Hyperlipidemia    . Hypertension    . TIA (transient ischemic attack)    . CAD (coronary artery disease)    . Myocardial infarction    . Hernia    . Presence of stent in coronary artery        Past Surgical History   Procedure Date   . Coronary angioplasty with stent placement      x2    . Neck surgery    . Back surgery    . Coronary angioplasty with stent placement 12/13/12     DES to OM1       Current/Home Medications     AMITRIPTYLINE (ELAVIL) 100 MG TABLET    Take 100 mg by mouth nightly.    ASPIRIN EC 81 MG EC TABLET    Take 81 mg by mouth daily. Took 4 81mg  ASA this AM    ATORVASTATIN (LIPITOR) 10 MG TABLET    Take 4 tablets (40 mg total) by mouth nightly.    CYCLOBENZAPRINE (FLEXERIL) 10 MG TABLET    Take 10 mg by mouth every 8 (eight) hours as needed.    FENOFIBRATE (TRICOR) 48 MG TABLET    Take 2 tablets (96 mg total) by mouth daily.    GLIMEPIRIDE (AMARYL) 4 MG TABLET    Take 8 mg by mouth every morning before breakfast.    HYDROCHLOROTHIAZIDE (HYDRODIURIL) 12.5 MG TABLET    Take 12.5 mg by mouth daily.    IBUPROFEN (ADVIL,MOTRIN) 800 MG TABLET    Take 800 mg by mouth every 8 (eight) hours as needed.    INSULIN DETEMIR (LEVEMIR)  100 UNIT/ML INJECTION    Inject 60 Units into the skin nightly.    ISOSORBIDE MONONITRATE (IMDUR) 30 MG 24 HR TABLET    Take 30 mg by mouth daily.     METOPROLOL TARTRATE PO    Take 100 mg by mouth 2 (two) times daily.    NITROGLYCERIN (NITROSTAT) 0.4 MG SL TABLET    Place 0.4 mg under the tongue every 5 (five) minutes as needed.    PRASUGREL (EFFIENT) 5 MG TAB    Take 2 tablets (10 mg total) by mouth daily.    RANITIDINE (ZANTAC) 300 MG TABLET    Take 300 mg by mouth daily.    SITAGLIPTIN (JANUVIA) 100 MG TABLET    Take 100 mg by mouth daily.    TRAMADOL HCL 50 MG TABLET DISPERSIBLE    Take 50 mg by mouth every 6 (six) hours as needed.       Allergies:   Allergies   Allergen Reactions   . Gabapentin    . Glucophage (Metformin Hydrochloride)    . Keflex (Cephalexin)    . Morphine        Family History   Problem Relation Age of Onset   . Heart disease Mother    . Stroke Mother    . Diabetes Mother    . Heart attack Mother    . Heart disease Father    . Stroke Father    . Heart attack Father    . Diabetes Sister        SHx:  reports that he has never smoked. He has never used smokeless tobacco. He reports that he does not drink alcohol or use illicit drugs.    REVIEW OF SYSTEMS     Review of Systems -  Negative except hx chronic back pain and recent runny nose.      PHYSICAL EXAM     Vital Signs: Blood pressure 125/109, pulse 84, temperature 97.7 F (36.5 C), resp. rate 10, height 1.753 m (5\' 9" ), weight 93.441 kg (206 lb), SpO2 100.00%.    Constitutional: Vital signs reviewed. Patient appears comfortable.    Eyes: Pupils equal. Reactive to light. Orbits, eyelids, conjunctiva and sclera appear normal.  ENMT: NL appearance of ears/nose. MMM. Oropharynx clear without erythema.  Respiratory: Lungs are clear bilaterally. No work of breathing.  Cardiovascular: Heart regular rate and rhythm. No leg edema bilaterally.  GI/GU: Abdomen is soft, nontender; no guarding or rigidity.  Skin: Skin is warm and dry.  Neurologic: Awake and alert. Memory intact. No CN deficits.  Psychiatric: Normal affect and insight; no agitation. Appropriate for age.  Musculoskeletal: Neck is supple. Trachea is midline. No calf TTP.      LABS & IMAGING     Labs:  Results for orders placed during the hospital encounter of 03/18/13   CBC AND DIFFERENTIAL       Component Value Range    WBC 5.0  4.0 - 11.0 K/cmm    RBC 4.35  4.00 - 5.70 M/cmm    Hemoglobin 13.3  13.0 - 17.5 gm/dL    Hematocrit 16.1 (*) 39.0 - 52.5 %    MCV 83  80 - 100 fL    MCH 31  28 - 35 pg    MCHC 37 (*) 32 - 36 gm/dL    RDW 09.6  04.5 - 40.9 %    PLT CT 204  130 - 440 K/cmm    MPV 7.1  6.0 - 10.0 fL  NEUTROPHIL % 46.8  42.0 - 78.0 %    Lymphocytes 40.8  15.0 - 46.0 %    Monocytes 7.9  3.0 - 15.0 %    Eosinophils % 3.6  0.0 - 7.0 %    Basophils % 0.9  0.0 - 3.0 %    Neutrophils Absolute 2.3  1.7 - 8.6 K/cmm    Lymphocytes Absolute 2.0  0.6 - 5.1 K/cmm    Monocytes Absolute 0.4  0.1 - 1.7 K/cmm    Eosinophils Absolute 0.2  0.0 - 0.8 K/cmm    BASO Absolute 0.0  0.0 - 0.3 K/cmm   VH CARDIAC PROFILE       Component Value Range    Creatinine Kinase MB (CKMB) 5.6  0.1 - 6.0 ng/mL    Creatine Kinase (CK) 192  30 - 230 U/L    CKMB Index NI  0.0 - 2.3 %   TROPONIN I       Component  Value Range    Troponin I 0.00  0.00 - 0.02 ng/mL   PT AND APTT       Component Value Range    PT 12.0 (*) 9.5 - 11.5 sec    PT INR 1.2  0.5 - 1.3    aPTT 22.6 (*) 24.0 - 34.0 sec   COMPREHENSIVE METABOLIC PANEL       Component Value Range    Sodium 138  136 - 147 mMol/L    Potassium 4.3  3.5 - 5.3 mMol/L    Chloride 105  98 - 110 mMol/L    CO2 26.1  20.0 - 30.0 mMol/L    CALCIUM 9.5  8.5 - 10.5 mg/dL    Glucose 161 (*) 70 - 99 mg/dL    Creatinine 0.96  0.45 - 1.30 mg/dL    BUN 17  7 - 22 mg/dL    Protein, Total 6.6  6.0 - 8.3 gm/dL    Albumin 3.7  3.5 - 5.0 gm/dL    Alkaline Phosphatase 51  40 - 145 U/L    ALT 30  0 - 55 U/L    AST (SGOT) 18  10 - 42 U/L    Bilirubin, Total 1.4 (*) 0.1 - 1.2 mg/dL    Albumin/Globulin Ratio 1.28  0.70 - 1.50 Ratio    Anion Gap 11.2  7.0 - 18.0 mMol/L    BUN/Creatinine Ratio 18.1  10.0 - 30.0 Ratio    EGFR >60      Osmolality Calc 294  275 - 300 mOsm/kg    Globulin 2.9  2.0 - 4.0 gm/dL       Imaging:  XR CHEST 2 VIEWS    Final Result: No acute cardiopulmonary disease.         ReadingStation:[id number]       EKG: Sinus rhythm; negative for ischemic changes. HR 72.    EMERGENCY DEPARTMENT COURSE     ED Medication Orders      Start     Status Ordering Provider    03/18/13 1057      Once in ED,   Status:  Discontinued      Route: Oral  Ordered Dose: 243 mg         Discontinued KOCHINSKY, JEROME T    03/18/13 1043   insulin regular (HumuLIN R,NovoLIN R) injection 10 Units   Once in ED      Route: Intravenous  Ordered Dose: 10 Units         Last MAR action:  Given Carlena Sax T    03/18/13 1030   acetaminophen (TYLENOL) tablet 1,000 mg   Once      Route: Oral  Ordered Dose: 1,000 mg         Last MAR action:  Given Carlena Sax T    03/18/13 0946   nitroglycerin (NITRO-BID) 2 % ointment 1 inch   Once in ED      Route: Topical  Ordered Dose: 1 inch         Last MAR action:  Given Carlena Sax T                ED documentation including nurses notes were reviewed by myself.  The  case was discussed with the ED Attending, Dr. Simone Curia and Obs unit attending, Dr. Carmon Ginsberg.      ASSESSMENT & PLAN     Michael [name] is a 57 y.o. male admitted under OBSERVATION with Chest Pain and PMH of GERD, IDDM, hyperlipidemia, HTN, TIA, CAD, obesity, and MI.    Assessment & Plan:  1. Admit to Observation Unit for cardiac work up.   -Telemetry  -Serial cardiac enzymes.    -Repeat ECG  -Fasting Lipid Panel in AM  -Nitrates for pain as needed  -Nuclear Stress Test this afternoon  -Consult Dr. Howie Ill    I certify the need for admission to the Observation Unit based on the patient's history and the information above.    Blondell Reveal [place], [place]   03/18/2013 11:22 AM

## 2013-03-18 NOTE — Consults (Signed)
[facility]  Consultation Note    Date Time: 03/18/2013 6:28 PM  Patient Name: Michael [name], Michael [name]  MRN#: [id number]  DOB: Sep 11, 1955  Consulting Physician: Dedra Skeens,*    Reason for Consult:   CP    History:   Tyren Dugar is a 57 y.o. male who presents with symptoms of CP which he developed   while at doctors' office. Describes that the symptoms started with numbness of his left face,  neck and then his left shoulder and chest started hurting, graded it at 9/10 and said that it   was associated with nausea. Reports similar symptoms prior to his previous stents. Denied  diaphoresis, worsening sob, palpitations, presyncope, syncope. Has been active otherwise   and doing well since his last stent. Pain resolved with three s/l NG, however, when I saw him  this evening he started having similar symptoms.      Past Medical History:     Past Medical History   Diagnosis Date   . Gastroesophageal reflux disease    . Diabetes mellitus    . Hyperlipidemia    . Hypertension    . TIA (transient ischemic attack)    . CAD (coronary artery disease)    . Myocardial infarction    . Hernia    . Presence of stent in coronary artery        Past Surgical History:     Past Surgical History   Procedure Date   . Coronary angioplasty with stent placement      x2    . Neck surgery    . Back surgery    . Coronary angioplasty with stent placement 12/13/12     DES to OM1       Problem List:   Active Problems:   Chest pain      Allergies:     Allergies   Allergen Reactions   . Gabapentin    . Glucophage (Metformin Hydrochloride)    . Keflex (Cephalexin)    . Morphine        Medications:      Current Facility-Administered Medications   Medication Dose Route Frequency   . [COMPLETED] acetaminophen  1,000 mg Oral Once   . [START ON 03/19/2013] aspirin  81 mg Oral Daily   . famotidine  20 mg Oral QHS   . glipiZIDE  10 mg Oral BID AC   . [START ON 03/19/2013] hydrochlorothiazide  12.5 mg Oral Daily   .  hydrOXYzine  50 mg Oral QHS   . insulin aspart  1-5 Units Subcutaneous TID AC & HS   . insulin glargine  10 Units Subcutaneous QHS   . [COMPLETED] insulin regular  10 Units Intravenous Once in ED   . isosorbide mononitrate  30 mg Oral Daily   . metoprolol  100 mg Oral BID   . [COMPLETED] nitroglycerin  1 inch Topical Once in ED   . [START ON 03/19/2013] prasugrel  10 mg Oral Daily   . [START ON 03/19/2013] sitaGLIPtin  100 mg Oral Daily   . sodium chloride (PF)  3 mL Intravenous Q8H   . [DISCONTINUED] aspirin  243 mg Oral Once in ED   . [DISCONTINUED] glimepiride  8 mg Oral QAM AC   . [DISCONTINUED] hydrochlorothiazide  12.5 mg Oral Daily     Prior to Admission medications    Medication Sig Start Date End Date Taking? Authorizing Provider   aspirin EC 81 MG EC tablet Take  81 mg by mouth daily. Took 4 81mg  ASA this AM   Yes [provider]   atorvastatin (LIPITOR) 10 MG tablet Take 4 tablets (40 mg total) by mouth nightly. 12/14/12  Yes Rosea Dory, Evlyn Clines, MD   fenofibrate (TRICOR) 48 MG tablet Take 2 tablets (96 mg total) by mouth daily. 12/14/12  Yes Gwendolynn Merkey, MD   glimepiride (AMARYL) 4 MG tablet Take 8 mg by mouth every morning before breakfast.   Yes [provider]   glipiZIDE (GLUCOTROL) 10 MG tablet Take 10 mg by mouth 2 (two) times daily before meals.   Yes [provider]   hydrochlorothiazide (HYDRODIURIL) 12.5 MG tablet Take 12.5 mg by mouth daily.   Yes [provider]   hydrOXYzine (VISTARIL) 50 MG capsule Take 50 mg by mouth nightly.   Yes [provider]   ibuprofen (ADVIL,MOTRIN) 800 MG tablet Take 800 mg by mouth every 8 (eight) hours as needed.   Yes [provider]   insulin detemir (LEVEMIR) 100 UNIT/ML injection Inject 60 Units into the skin nightly.   Yes [provider]   insulin glargine (LANTUS) 100 UNIT/ML injection Inject 10 Units into the skin nightly.   Yes [provider]   isosorbide mononitrate (IMDUR) 30 MG 24 hr  tablet Take 30 mg by mouth daily.  02/28/13  Yes [provider]   METOPROLOL TARTRATE PO Take 100 mg by mouth 2 (two) times daily.   Yes [provider]   nitroglycerin (NITROSTAT) 0.4 MG SL tablet Place 0.4 mg under the tongue every 5 (five) minutes as needed.   Yes [provider]   prasugrel (EFFIENT) 5 MG Tab Take 2 tablets (10 mg total) by mouth daily. 12/14/12  Yes Kiki Bivens, Evlyn Clines, MD   ranitidine (ZANTAC) 300 MG tablet Take 300 mg by mouth daily.   Yes [provider]   sitaGLIPtin (JANUVIA) 100 MG tablet Take 100 mg by mouth daily.   Yes [provider]   amitriptyline (ELAVIL) 100 MG tablet Take 100 mg by mouth nightly.    [provider]   cyclobenzaprine (FLEXERIL) 10 MG tablet Take 10 mg by mouth every 8 (eight) hours as needed.    [provider]   TraMADol HCl 50 MG Tablet Dispersible Take 1-2 tablets by mouth every 8 (eight) hours as needed. 50 mg    [provider]       Family History:     Family History   Problem Relation Age of Onset   . Heart disease Mother    . Stroke Mother    . Diabetes Mother    . Heart attack Mother    . Heart disease Father    . Stroke Father    . Heart attack Father    . Diabetes Sister        Social History:     History     Social History   . Marital Status: Single     Spouse Name: N/A     Number of Children: N/A   . Years of Education: N/A     Occupational History   . Not on file.     Social History Main Topics   . Smoking status: Never Smoker    . Smokeless tobacco: Never Used   . Alcohol Use: No   . Drug Use: No   . Sexually Active:      Other Topics Concern   . Not on file  Social History Narrative   . No narrative on file       Review of Systems:     All systems reviewed and negative other than those noted in the HPI.    Physical Exam:     Filed Vitals:    03/18/13 1600   BP: 120/66   Pulse: 64   Temp:    Resp: 18   SpO2: 98%       Intake and Output Summary (Last 24 hours) at Date Time  No  intake or output data in the 24 hours ending 03/18/13 1828    General appearance - alert, well appearing, and in no distress  Head - Normocephalic, atraumatic  Throat - Moist mucous membranes  Eyes - pupils equal and reactive, extraocular eye movements intact  Neck - supple, no significant adenopathy, no thyromegaly, no masses, no JVD, no carotid bruit  Chest - clear to auscultation, no wheezes, rales or rhonchi  Heart - normal rate, regular rhythm, normal S1, S2, no murmurs, rubs, clicks or gallops  Abdomen - soft, nontender, nondistended, no masses or organomegaly, bowel sounds heard in all four quadrants  Neurological - alert, awake, oriented, non focal otherwise  Extremities - peripheral pulses normal, no pedal edema, no clubbing or cyanosis  Skin - normal coloration and turgor, no rashes, no suspicious skin lesions noted    Labs Reviewed:   Recent CMP   Lab 03/18/13 0922   GLU 401*   BUN 17   CREAT 0.94   NA 138   K 4.3   MG --   CO2 26.1   CA 9.5   AST 18   ALT 30   BILITOTAL 1.4*   GLOB 2.9       Recent CARDIAC ENZYMES   Lab 03/18/13 1317 03/18/13 0922   TROPI 0.00 0.00   CK 165 192   CKMB 4.8 5.6   CKMBINDEX NI NI   BNP -- --       Recent TSH No results found for this basename: TSH:10 in the last 168 hours    Recent PT/PTT   Lab 03/18/13 0922   PT 12.0*   INR 1.2   APTT 22.6*       Recent CBC WITH DIFF   Lab 03/18/13 0922   WBC 5.0   RBC 4.35   HGB 13.3   HCT 36.2*   MCV 83   MPV 7.1   PLT 204       Recent LIPID PANEL   Cholesterol (mg/dL)   Date Value   3/81/8299 126    10/20/2011 155     Triglycerides (mg/dL)   Date Value   3/71/6967 678*   10/20/2011 286*    HDL (mg/dL)   Date Value   8/93/8101 24*   10/20/2011 32*    LDL Calculated (mg/dL)   Date Value   7/51/0258 Unable to calculate due to triglycerides >400 mg/dl    08/17/7780 66         Rads:     Radiology Results (24 Hour)     Procedure Component Value Units Date/Time    NM Myocardial Perfusion Spect (Stress And Rest) [[id number]]     Order Status:Sent   Updated:03/18/13 1600    CV Cardiac Stress Test Tracing Only [[id number]]     Order Status:Sent  Updated:03/18/13 1600    XR Chest 2 Views [[id number]] Collected:03/18/13 0959    Order Status:Completed  Updated:03/18/13 1003    Narrative:    Clinical History:  Chest pain, coronary artery disease    Examination:  Frontal and lateral views of the chest.    Comparison:  None available.    Findings:  Heart size is normal. The right hemidiaphragm is mildly elevated. The lungs are clear. Mild to moderate hypertrophic  changes are present in the thoracic spine.      Impression:    No acute cardiopulmonary disease.    ReadingStation:[id number]          Cardiovascular Workup:   EKG:  Sinus rhythm    Cardiac stress test (03/18/2013)  Low risk     Cath (11/2012)  PREOPERATIVE DIAGNOSIS:   1. Symptoms of crescendo angina, with a severely abnormal   stress test.   2. History of coronary artery disease, status post PCI in   the past.   POSTPROCEDURE DIAGNOSIS:   1. 99% proximal OM2 stenosis status post percutaneous   balloon angioplasty followed by drug-eluting stent placement,   Xience 3.25 x 23 mm.   2. Prior stents in RCA and proximal left circumflex noted   to be patent.      Assessment:   57 yo with HTN, HLD, DM, TIA, CAD s/p PCI in the past presenting with symptoms of   unstable angina. AMI was ruled out. Stress test was unremarkable.     Recommendations:     1. I offered pt continued medical management Vs cath and he opts for the later. Will proceed with LHC and coronary angio tomorrow. Informed consent signed  2. Continue home dose of Asa, Effient, BB, Statin, Fenofibrate, Imdur       Thank you for consulting [facility] and allowing [place] to participate in this pt's care! We will follow along while the pt is in the hospital.    Signed by: Orest Dikes, MD

## 2013-03-18 NOTE — ED Notes (Signed)
Bed:S20-A<BR> Expected date:03/18/13<BR> Expected time: 9:00 AM<BR> Means of arrival:Ambulance<BR> Comments:<BR> EMS

## 2013-03-18 NOTE — ED Notes (Signed)
SBAR to Perry McAlister RN.

## 2013-03-18 NOTE — Progress Notes (Signed)
OBSERVATION UNIT  PROGRESS NOTE    Patient Name: Michael [name], Michael [name]  Time: 03/18/2013 4:52 PM    Subjective:   Pt doing well after his stress test.  Denies recurrent CP episodes.      Objective:     Filed Vitals:    03/18/13 1600   BP: 120/66   Pulse: 64   Temp:    Resp: 18   SpO2: 98%       Constitutional:  Vitals signs reviewed.  Pt appears well.  NAD.   Head:  NCAT.    Eyes:  Normal to inspection.  No discharge or scleral icterus.    ENT:  Mouth normal to inspection.  Neuro:  GCS 15, no focal motor deficits.  Normal gait.  Moves all extremities well.    Skin:  Warm, dry.  Resp/Chest:  No respiratory distress.  Normal work of breathing.    Upper Extremity:  Normal to inspection.    Lower Extremity:  Normal to inspection.       Labs:  Recent Results (from the past 24 hour(s))   CBC AND DIFFERENTIAL    Collection Time    03/18/13  9:22 AM       Component Value Range    WBC 5.0  4.0 - 11.0 K/cmm    RBC 4.35  4.00 - 5.70 M/cmm    Hemoglobin 13.3  13.0 - 17.5 gm/dL    Hematocrit 10.2 (*) 39.0 - 52.5 %    MCV 83  80 - 100 fL    MCH 31  28 - 35 pg    MCHC 37 (*) 32 - 36 gm/dL    RDW 72.5  36.6 - 44.0 %    PLT CT 204  130 - 440 K/cmm    MPV 7.1  6.0 - 10.0 fL    NEUTROPHIL % 46.8  42.0 - 78.0 %    Lymphocytes 40.8  15.0 - 46.0 %    Monocytes 7.9  3.0 - 15.0 %    Eosinophils % 3.6  0.0 - 7.0 %    Basophils % 0.9  0.0 - 3.0 %    Neutrophils Absolute 2.3  1.7 - 8.6 K/cmm    Lymphocytes Absolute 2.0  0.6 - 5.1 K/cmm    Monocytes Absolute 0.4  0.1 - 1.7 K/cmm    Eosinophils Absolute 0.2  0.0 - 0.8 K/cmm    BASO Absolute 0.0  0.0 - 0.3 K/cmm   [facility] CARDIAC PROFILE    Collection Time    03/18/13  9:22 AM       Component Value Range    Creatinine Kinase MB (CKMB) 5.6  0.1 - 6.0 ng/mL    Creatine Kinase (CK) 192  30 - 230 U/L    CKMB Index NI  0.0 - 2.3 %   TROPONIN I    Collection Time    03/18/13  9:22 AM       Component Value Range    Troponin I 0.00  0.00 - 0.02 ng/mL   PT AND APTT    Collection Time    03/18/13  9:22 AM        Component Value Range    PT 12.0 (*) 9.5 - 11.5 sec    PT INR 1.2  0.5 - 1.3    aPTT 22.6 (*) 24.0 - 34.0 sec   COMPREHENSIVE METABOLIC PANEL    Collection Time    03/18/13  9:22 AM       Component Value  Range    Sodium 138  136 - 147 mMol/L    Potassium 4.3  3.5 - 5.3 mMol/L    Chloride 105  98 - 110 mMol/L    CO2 26.1  20.0 - 30.0 mMol/L    CALCIUM 9.5  8.5 - 10.5 mg/dL    Glucose 102 (*) 70 - 99 mg/dL    Creatinine 7.25  3.66 - 1.30 mg/dL    BUN 17  7 - 22 mg/dL    Protein, Total 6.6  6.0 - 8.3 gm/dL    Albumin 3.7  3.5 - 5.0 gm/dL    Alkaline Phosphatase 51  40 - 145 U/L    ALT 30  0 - 55 U/L    AST (SGOT) 18  10 - 42 U/L    Bilirubin, Total 1.4 (*) 0.1 - 1.2 mg/dL    Albumin/Globulin Ratio 1.28  0.70 - 1.50 Ratio    Anion Gap 11.2  7.0 - 18.0 mMol/L    BUN/Creatinine Ratio 18.1  10.0 - 30.0 Ratio    EGFR >60      Osmolality Calc 294  275 - 300 mOsm/kg    Globulin 2.9  2.0 - 4.0 gm/dL   VH DEXTROSE STICK GLUCOSE    Collection Time    03/18/13 11:41 AM       Component Value Range    Glucose, POCT 239 (*) 70 - 99 mg/dL   [facility] CARDIAC PROF.WITH TROPONIN    Collection Time    03/18/13  1:17 PM       Component Value Range    Creatinine Kinase MB (CKMB) 4.8  0.1 - 6.0 ng/mL    Creatine Kinase (CK) 165  30 - 230 U/L    Troponin I 0.00  0.00 - 0.02 ng/mL    CKMB Index NI  0.0 - 2.3 %   VH DEXTROSE STICK GLUCOSE    Collection Time    03/18/13  4:01 PM       Component Value Range    Glucose, POCT 194 (*) 70 - 99 mg/dL       Imaging:  Xr Chest 2 Views    03/18/2013  No acute cardiopulmonary disease.  ReadingStation:[id number]       Medications:  Current Facility-Administered Medications   Medication Dose Route Frequency   . [COMPLETED] acetaminophen  1,000 mg Oral Once   . [START ON 03/19/2013] aspirin  81 mg Oral Daily   . famotidine  20 mg Oral QHS   . glipiZIDE  10 mg Oral BID AC   . [START ON 03/19/2013] hydrochlorothiazide  12.5 mg Oral Daily   . hydrOXYzine  50 mg Oral QHS   . insulin aspart  1-5 Units Subcutaneous TID AC & HS    . insulin glargine  10 Units Subcutaneous QHS   . [COMPLETED] insulin regular  10 Units Intravenous Once in ED   . isosorbide mononitrate  30 mg Oral Daily   . metoprolol  100 mg Oral BID   . [COMPLETED] nitroglycerin  1 inch Topical Once in ED   . [START ON 03/19/2013] prasugrel  10 mg Oral Daily   . [START ON 03/19/2013] sitaGLIPtin  100 mg Oral Daily   . sodium chloride (PF)  3 mL Intravenous Q8H   . [DISCONTINUED] aspirin  243 mg Oral Once in ED   . [DISCONTINUED] glimepiride  8 mg Oral QAM AC   . [DISCONTINUED] hydrochlorothiazide  12.5 mg Oral Daily       Assessment:  Chest pain, hx recent stent placement by Dr. Howie Ill in 11/2012    Past Medical History   Diagnosis Date   . Gastroesophageal reflux disease    . Diabetes mellitus    . Hyperlipidemia    . Hypertension    . TIA (transient ischemic attack)    . CAD (coronary artery disease)    . Myocardial infarction    . Hernia    . Presence of stent in coronary artery        Plan:   Continued clinical monitoring  Dr. Howie Ill is going to see the pt in the obs unit this evening, will await his consultation      Rachel Bo, PA

## 2013-03-18 NOTE — ED Provider Notes (Signed)
[facility] EMERGENCY DEPARTMENT History and Physical Exam      Patient Name: Michael [name], Michael [name]  Encounter Date:  03/18/2013  Attending Physician: Wynona Neat, DO  PCP: Alonna Minium, DO  Patient DOB:  03-19-1956  MRN:  [id number]  Room:  S20/S20-A      History of Presenting Illness     Chief complaint: Chest Pain    HPI/ROS is limited by: none  HPI/ROS given by: patient    Location: CHEST  Duration: JUST PRIOR TO ARRIVAL  Severity: mild    Michael [name] is a 57 y.o. male who presents with MID-STERNAL CHEST PAIN THAT BEGAN AT REST WHILE SITTING IN A DOCTOR'S OFFICE EARLIER THIS MORNING. PATIENT STATES PAIN BEGAN AS DULL WITH PERIODS OF SHARPNESS AND IS DULL AT THIS TIME. NO SOB. NO DIAPHORESIS. NO FOUL TASTE IN THE BACK OF HIS MOUTH. PATIENT HAS RECENT HX OF ABNORMAL STRESS TEST WITH PLACEMENT OF FOUR CARDIAC STENTS. NO FEVER OR CHILLS. NO HEADACHE. NO ABDOMINAL PAIN OR NVD. NO BOWEL/BLADDER COMPLAINT. NO FURTHER CONSTITUTIONAL COMPLAINTS. OF NOTE, PATIENT STATES HE TOOK HIS 81MG  ASPIRIN THIS MORNING.     Review of Systems     Review of Systems   Constitutional: Negative for fever, chills and fatigue.   HENT: Negative for congestion, ear pain and sore throat.    Eyes: Negative for photophobia, pain and redness.   Respiratory: Negative for cough, chest tightness, shortness of breath and wheezing.    Cardiovascular: Positive for chest pain. Negative for palpitations and leg swelling.   Gastrointestinal: Negative for nausea, vomiting, abdominal pain, diarrhea and constipation.   Genitourinary: Negative for dysuria, hematuria and flank pain.   Musculoskeletal: Negative for arthralgias, myalgias and neck pain.   Skin: Negative for rash.   Neurological: Negative for dizziness, weakness, light-headedness, numbness and headaches.   Hematological: Negative for adenopathy.   Psychiatric/Behavioral: The patient is not nervous/anxious.         Allergies     Pt is allergic to gabapentin; glucophage; keflex; and  morphine.    Medications     Current Outpatient Rx   Name  Route  Sig  Dispense  Refill   . ASPIRIN EC 81 MG PO TBEC    Oral    Take 81 mg by mouth daily. Took 4 81mg  ASA this AM             . ATORVASTATIN CALCIUM 10 MG PO TABS    Oral    Take 4 tablets (40 mg total) by mouth nightly.    30 tablet    3     . FENOFIBRATE 48 MG PO TABS    Oral    Take 2 tablets (96 mg total) by mouth daily.    30 tablet    3     . GLIMEPIRIDE 4 MG PO TABS    Oral    Take 8 mg by mouth every morning before breakfast.             . HYDROCHLOROTHIAZIDE 12.5 MG PO TABS    Oral    Take 12.5 mg by mouth daily.             . IBUPROFEN 800 MG PO TABS    Oral    Take 800 mg by mouth every 8 (eight) hours as needed.             . INSULIN DETEMIR 100 UNIT/ML SC SOLN    Subcutaneous    Inject 60  Units into the skin nightly.             . ISOSORBIDE MONONITRATE ER 30 MG PO TB24    Oral    Take 30 mg by mouth daily.              Marland Kitchen METOPROLOL TARTRATE PO    Oral    Take 100 mg by mouth 2 (two) times daily.             Marland Kitchen NITROGLYCERIN 0.4 MG SL SUBL    Sublingual    Place 0.4 mg under the tongue every 5 (five) minutes as needed.             Marland Kitchen PRASUGREL HCL 5 MG PO TABS    Oral    Take 2 tablets (10 mg total) by mouth daily.    30 tablet    11     . RANITIDINE HCL 300 MG PO TABS    Oral    Take 300 mg by mouth daily.             Marland Kitchen SITAGLIPTIN PHOSPHATE 100 MG PO TABS    Oral    Take 100 mg by mouth daily.             . TRAMADOL HCL 50 MG PO TBDP    Oral    Take 50 mg by mouth every 6 (six) hours as needed.             Marland Kitchen AMITRIPTYLINE HCL 100 MG PO TABS    Oral    Take 100 mg by mouth nightly.             . CYCLOBENZAPRINE HCL 10 MG PO TABS    Oral    Take 10 mg by mouth every 8 (eight) hours as needed.                  Past Medical History     Pt has a past medical history of Gastroesophageal reflux disease; Diabetes mellitus; Hyperlipidemia; Hypertension; TIA (transient ischemic attack); CAD (coronary artery disease); Myocardial infarction; Hernia;  and Presence of stent in coronary artery.    Past Surgical History     Pt has past surgical history that includes Coronary angioplasty with stent; Neck surgery; Back surgery; and Coronary angioplasty with stent (12/13/12).    Family History     The family history includes Diabetes in his mother and sister; Heart attack in his father and mother; Heart disease in his father and mother; and Stroke in his father and mother.    Social History     Pt reports that he has never smoked. He has never used smokeless tobacco. He reports that he does not drink alcohol or use illicit drugs.    Physical Exam     Blood pressure 113/70, pulse 74, temperature 97.7 F (36.5 C), resp. rate 14, height 1.753 m, weight 93.441 kg, SpO2 99.00%.    Physical Exam   Nursing note and vitals reviewed.  Constitutional: He is oriented to person, place, and time. He appears well-developed and well-nourished. No distress.   HENT:   Head: Normocephalic and atraumatic.   Nose: Nose normal.   Mouth/Throat: Oropharynx is clear and moist.   Eyes: Conjunctivae normal and EOM are normal. Pupils are equal, round, and reactive to light. No scleral icterus.   Neck: Normal range of motion. Neck supple. No tracheal deviation present. No thyromegaly present.   Cardiovascular: Normal rate, regular rhythm,  normal heart sounds and intact distal pulses.    Pulmonary/Chest: Effort normal and breath sounds normal. He exhibits no tenderness.   Abdominal: Soft. Bowel sounds are normal. There is no tenderness. There is no rebound and no guarding.   Musculoskeletal: Normal range of motion. He exhibits no edema.   Lymphadenopathy:     He has no cervical adenopathy.   Neurological: He is alert and oriented to person, place, and time. He has normal reflexes.   Skin: Skin is warm and dry. No erythema.   Psychiatric: He has a normal mood and affect.       Orders Placed     Orders Placed This Encounter   Procedures   . XR Chest 2 Views   . CBC and differential   . Cardiac  Profile   . Troponin I   . PT/APTT   . Comprehensive metabolic panel   . Cardiac monitoring (Hard Wire)   . Nasal Cannula Low-Flow 3 L/minute   . ECG 12 lead   . Saline lock IV       Diagnostic Results       The results of the diagnostic studies below have been reviewed by myself:    Labs  Results     Procedure Component Value Units Date/Time    Cardiac Profile [[id number]] Collected:03/18/13 0922    Specimen Information:Plasma Updated:03/18/13 1031     Creatine Kinase (CK) 192 U/L     Comprehensive metabolic panel [[id number]]  (Abnormal) Collected:03/18/13 0922    Specimen Information:Blood / Plasma Updated:03/18/13 1028     Sodium 138 mMol/L      Potassium 4.3 mMol/L      Chloride 105 mMol/L      CO2 26.1 mMol/L      CALCIUM 9.5 mg/dL      Glucose 213 (H) mg/dL      Creatinine 0.86 mg/dL      BUN 17 mg/dL      Protein, Total 6.6 gm/dL      Albumin 3.7 gm/dL      Alkaline Phosphatase 51 U/L      ALT 30 U/L      AST (SGOT) 18 U/L      Bilirubin, Total 1.4 (H) mg/dL      Albumin/Globulin Ratio 1.28 Ratio      Anion Gap 11.2 mMol/L      BUN/Creatinine Ratio 18.1 Ratio      EGFR >60 mL/min/1.42m2      Osmolality Calc 294 mOsm/kg      Globulin 2.9 gm/dL     Troponin I [[id number]] Collected:03/18/13 0922    Specimen Information:Blood / Plasma Updated:03/18/13 1017    PT/APTT [[id number]]  (Abnormal) Collected:03/18/13 0922    Specimen Information:Blood Updated:03/18/13 1010     PT 12.0 (H) sec      PT INR 1.2      aPTT 22.6 (L) sec     CBC and differential [[id number]]  (Abnormal) Collected:03/18/13 0922    Specimen Information:Blood / Blood Updated:03/18/13 1001     WBC 5.0 K/cmm      RBC 4.35 M/cmm      Hemoglobin 13.3 gm/dL      Hematocrit 64.4 (L) %      MCV 83 fL      MCH 31 pg      MCHC 37 (H) gm/dL      RDW 03.4 %      PLT CT 204 K/cmm      MPV 7.1 fL  NEUTROPHIL % 46.8 %      Lymphocytes 40.8 %      Monocytes 7.9 %      Eosinophils % 3.6 %      Basophils % 0.9 %      Neutrophils Absolute 2.3 K/cmm       Lymphocytes Absolute 2.0 K/cmm      Monocytes Absolute 0.4 K/cmm      Eosinophils Absolute 0.2 K/cmm      BASO Absolute 0.0 K/cmm           Radiologic Studies  Radiology Results (24 Hour)     Procedure Component Value Units Date/Time    XR Chest 2 Views [[id number]] Collected:03/18/13 0959    Order Status:Completed  Updated:03/18/13 1003    Narrative:    Clinical History:  Chest pain, coronary artery disease    Examination:  Frontal and lateral views of the chest.    Comparison:  None available.    Findings:  Heart size is normal. The right hemidiaphragm is mildly elevated. The lungs are clear. Mild to moderate hypertrophic  changes are present in the thoracic spine.      Impression:    No acute cardiopulmonary disease.    ReadingStation:[id number]          EKG: normal EKG, normal sinus rhythm, no significant ST-T wave changes.    MDM / Critical Care     Blood pressure 113/70, pulse 74, temperature 97.7 F (36.5 C), resp. rate 14, height 1.753 m, weight 93.441 kg, SpO2 99.00%.    Diagnostic Considerations:  1. CARDIAC ETIOLOGY  2. PULMONARY ETIOLOGY  3. GERD    Consultations:  1. Dr Howie Ill at 10:58 AM to discuss case.  2. NICOLE HOFFMAN, PA-C at 10:58 AM to discuss case and arrange OBSERVATION admission.    Procedures     NONE    Diagnosis / Disposition     Clinical Impression  No diagnosis found.    Disposition  ED Disposition     None          Prescriptions  New Prescriptions    No medications on file           Attestations     The documentation recorded by my scribe, Georgiana Spinner, accurately reflects the services I personally performed and the decisions made by me.  Wynona Neat, DO        Carlena Sax T, DO  03/18/13 1101

## 2013-03-19 ENCOUNTER — Encounter
Admission: EM | Disposition: A | Discharge status: Home / Self Care | Payer: Self-pay | Source: Home / Self Care | Attending: Emergency Medicine

## 2013-03-19 LAB — LIPID PANEL
Cholesterol: 98 mg/dL (ref 75–199)
Coronary Heart Disease Risk: 3.92
HDL: 25 mg/dL — ABNORMAL LOW (ref 40–55)
LDL Calculated: 42 mg/dL
Triglycerides: 154 mg/dL — ABNORMAL HIGH (ref 10–150)
VLDL: 31 (ref 0–40)

## 2013-03-19 LAB — CBC
Hematocrit: 37.8 % — ABNORMAL LOW (ref 39.0–52.5)
Hemoglobin: 14.2 gm/dL (ref 13.0–17.5)
MCH: 31 pg (ref 28–35)
MCHC: 38 gm/dL — ABNORMAL HIGH (ref 32–36)
MCV: 83 fL (ref 80–100)
MPV: 7 fL (ref 6.0–10.0)
PLT CT: 211 10*3/uL (ref 130–440)
RBC: 4.54 10*6/uL (ref 4.00–5.70)
RDW: 11.9 % (ref 11.0–14.0)
WBC: 7.1 10*3/uL (ref 4.0–11.0)

## 2013-03-19 LAB — ECG 12-LEAD
P Wave Axis: 49 deg
P Wave Duration: 116 ms
P-R Interval: 194 ms
Patient Age: 57 years
Q-T Dispersion: 20 ms
Q-T Interval(Corrected): 439 ms
Q-T Interval: 398 ms
QRS Axis: 33 deg
QRS Duration: 84 ms
T Axis: -1 deg
Ventricular Rate: 73 /min

## 2013-03-19 LAB — BASIC METABOLIC PANEL
Anion Gap: 13.4 mMol/L (ref 7.0–18.0)
BUN / Creatinine Ratio: 19.1 Ratio (ref 10.0–30.0)
BUN: 17 mg/dL (ref 7–22)
CO2: 24.6 mMol/L (ref 20.0–30.0)
Calcium: 9.2 mg/dL (ref 8.5–10.5)
Chloride: 107 mMol/L (ref 98–110)
Creatinine: 0.89 mg/dL (ref 0.80–1.30)
EGFR: >60 mL/min/{1.73_m2}
Glucose: 187 mg/dL — ABNORMAL HIGH (ref 70–99)
Osmolality Calc: 288 mOsm/kg (ref 275–300)
Potassium: 4 mMol/L (ref 3.5–5.3)
Sodium: 141 mMol/L (ref 136–147)

## 2013-03-19 LAB — VH DEXTROSE STICK GLUCOSE: Glucose POCT: 265 mg/dL — ABNORMAL HIGH (ref 70–99)

## 2013-03-19 LAB — HEMOGLOBIN A1C: Hgb A1C, %: 10 %

## 2013-03-19 SURGERY — LHC W/ CORONARY ANGIOS AND LV
Anesthesia: Conscious Sedation | Site: Groin | Laterality: Right

## 2013-03-19 MED ORDER — IODIXANOL 320 MG/ML IV SOLN
50.0000 mL | Freq: Once | INTRAVENOUS | Status: AC
Start: 2013-03-19 — End: 2013-03-19
  Administered 2013-03-19: 50 mL via INTRA_ARTERIAL

## 2013-03-19 MED ORDER — NITROGLYCERIN 0.4 MG SL SUBL
0.4000 mg | SUBLINGUAL_TABLET | SUBLINGUAL | Status: DC | PRN
Start: 2013-03-19 — End: 2014-08-25

## 2013-03-19 MED ORDER — HYDROXYZINE PAMOATE 50 MG PO CAPS
50.0000 mg | ORAL_CAPSULE | Freq: Every evening | ORAL | Status: AC
Start: 2013-03-19 — End: ?

## 2013-03-19 MED ORDER — FENTANYL CITRATE 0.05 MG/ML IJ SOLN
INTRAMUSCULAR | Status: AC
Start: 2013-03-19 — End: 2013-03-19
  Administered 2013-03-19 (×2): 50 ug via INTRAVENOUS
  Filled 2013-03-19: qty 2

## 2013-03-19 MED ORDER — SODIUM CHLORIDE 0.9 % IV SOLN
INTRAVENOUS | Status: DC
Start: 2013-03-19 — End: 2013-03-19

## 2013-03-19 MED ORDER — MIDAZOLAM HCL 2 MG/2ML IJ SOLN
INTRAMUSCULAR | Status: AC
Start: 2013-03-19 — End: 2013-03-19
  Administered 2013-03-19 (×2): 1 mg via INTRAVENOUS
  Filled 2013-03-19: qty 2

## 2013-03-19 MED ORDER — HEPARIN (PORCINE) IN NACL 2-0.9 UNIT/ML-% IJ SOLN
INTRAMUSCULAR | Status: AC
Start: 2013-03-19 — End: 2013-03-19
  Filled 2013-03-19: qty 500

## 2013-03-19 MED ORDER — INSULIN ASPART 100 UNIT/ML SC SOPN
1.0000 [IU] | PEN_INJECTOR | Freq: Four times a day (QID) | SUBCUTANEOUS | Status: DC
Start: 2013-03-19 — End: 2016-11-18

## 2013-03-19 MED ORDER — INSULIN GLARGINE 100 UNIT/ML SC SOPN
10.0000 [IU] | PEN_INJECTOR | Freq: Every evening | SUBCUTANEOUS | Status: DC
Start: 2013-03-19 — End: 2016-04-27

## 2013-03-19 MED ORDER — LIDOCAINE HCL 1 % IJ SOLN
INTRAMUSCULAR | Status: AC
Start: 2013-03-19 — End: 2013-03-19
  Filled 2013-03-19: qty 20

## 2013-03-19 MED ORDER — VH HEPARIN 10,000 UNITS/1000 ML NS
INJECTION | INTRAVENOUS | Status: AC
Start: 2013-03-19 — End: 2013-03-19
  Filled 2013-03-19: qty 1000

## 2013-03-19 MED ORDER — ASPIRIN 81 MG PO CHEW
81.0000 mg | CHEWABLE_TABLET | Freq: Every day | ORAL | Status: DC
Start: 2013-03-19 — End: 2016-04-27

## 2013-03-19 NOTE — Progress Notes (Signed)
OBSERVATION UNIT  PROGRESS NOTE    Patient Name: Michael [name], Michael [name]  Time: 03/19/2013 8:51 AM    Subjective:   Pt c/o constant sternal CP since 6AM.  He describes it as a heaviness, 7/10 in severity, without diaphoresis, nausea, vomiting, or SOB.  There is no radiation.  He ate breakfast this morning.  The pain is not worse with exertion.      Objective:     Filed Vitals:    03/19/13 0645   BP: 102/58   Pulse: 74   Temp: 97.5 F (36.4 C)   Resp: 18   SpO2: 98%       Constitutional: Vital signs reviewed. Patient is in mild discomfort.  ENMT: NL appearance of ears/nose. MMM.  Respiratory: Lungs are clear bilaterally. No work of breathing.  Cardiovascular: Heart regular rate and rhythm. No leg edema bilaterally.  Skin: Skin is warm and dry. No rashes.  Neurologic: Awake and alert. Memory intact.  Psychiatric: Normal affect and insight; no agitation. Appropriate for age. Pt is anxious.  Musculoskeletal: Neck is supple. Trachea is midline.        Labs:  Recent Results (from the past 24 hour(s))   ECG 12-LEAD    Collection Time    03/18/13  9:11 AM       Component Value Range    Patient Age 70      Patient Height        Patient Weight        Interpretation Text        Value: Sinus rhythm with borderline 1st degree A-V block      Inferior T wave changes are nonspecific            Borderline ECG            PREVIOUS TRACING: 12/13/2012 09.15    Physician Interpreter        Ventricular Rate 72      P Wave Duration 114      QRS Duration 78      P-R Interval 206      Q-T Interval 390      Q-T Interval(Corrected) 427      Q-T Dispersion 76      P Wave Axis 51      QRS Axis 33      T Axis 4     CBC AND DIFFERENTIAL    Collection Time    03/18/13  9:22 AM       Component Value Range    WBC 5.0  4.0 - 11.0 K/cmm    RBC 4.35  4.00 - 5.70 M/cmm    Hemoglobin 13.3  13.0 - 17.5 gm/dL    Hematocrit 96.0 (*) 39.0 - 52.5 %    MCV 83  80 - 100 fL    MCH 31  28 - 35 pg    MCHC 37 (*) 32 - 36 gm/dL    RDW 45.4  09.8 - 11.9 %    PLT CT 204   130 - 440 K/cmm    MPV 7.1  6.0 - 10.0 fL    NEUTROPHIL % 46.8  42.0 - 78.0 %    Lymphocytes 40.8  15.0 - 46.0 %    Monocytes 7.9  3.0 - 15.0 %    Eosinophils % 3.6  0.0 - 7.0 %    Basophils % 0.9  0.0 - 3.0 %    Neutrophils Absolute 2.3  1.7 - 8.6 K/cmm    Lymphocytes  Absolute 2.0  0.6 - 5.1 K/cmm    Monocytes Absolute 0.4  0.1 - 1.7 K/cmm    Eosinophils Absolute 0.2  0.0 - 0.8 K/cmm    BASO Absolute 0.0  0.0 - 0.3 K/cmm   [facility] CARDIAC PROFILE    Collection Time    03/18/13  9:22 AM       Component Value Range    Creatinine Kinase MB (CKMB) 5.6  0.1 - 6.0 ng/mL    Creatine Kinase (CK) 192  30 - 230 U/L    CKMB Index NI  0.0 - 2.3 %   TROPONIN I    Collection Time    03/18/13  9:22 AM       Component Value Range    Troponin I 0.00  0.00 - 0.02 ng/mL   PT AND APTT    Collection Time    03/18/13  9:22 AM       Component Value Range    PT 12.0 (*) 9.5 - 11.5 sec    PT INR 1.2  0.5 - 1.3    aPTT 22.6 (*) 24.0 - 34.0 sec   COMPREHENSIVE METABOLIC PANEL    Collection Time    03/18/13  9:22 AM       Component Value Range    Sodium 138  136 - 147 mMol/L    Potassium 4.3  3.5 - 5.3 mMol/L    Chloride 105  98 - 110 mMol/L    CO2 26.1  20.0 - 30.0 mMol/L    CALCIUM 9.5  8.5 - 10.5 mg/dL    Glucose 578 (*) 70 - 99 mg/dL    Creatinine 4.69  6.29 - 1.30 mg/dL    BUN 17  7 - 22 mg/dL    Protein, Total 6.6  6.0 - 8.3 gm/dL    Albumin 3.7  3.5 - 5.0 gm/dL    Alkaline Phosphatase 51  40 - 145 U/L    ALT 30  0 - 55 U/L    AST (SGOT) 18  10 - 42 U/L    Bilirubin, Total 1.4 (*) 0.1 - 1.2 mg/dL    Albumin/Globulin Ratio 1.28  0.70 - 1.50 Ratio    Anion Gap 11.2  7.0 - 18.0 mMol/L    BUN/Creatinine Ratio 18.1  10.0 - 30.0 Ratio    EGFR >60      Osmolality Calc 294  275 - 300 mOsm/kg    Globulin 2.9  2.0 - 4.0 gm/dL   VH DEXTROSE STICK GLUCOSE    Collection Time    03/18/13 11:41 AM       Component Value Range    Glucose, POCT 239 (*) 70 - 99 mg/dL   [facility] CARDIAC PROF.WITH TROPONIN    Collection Time    03/18/13  1:17 PM       Component Value Range     Creatinine Kinase MB (CKMB) 4.8  0.1 - 6.0 ng/mL    Creatine Kinase (CK) 165  30 - 230 U/L    Troponin I 0.00  0.00 - 0.02 ng/mL    CKMB Index NI  0.0 - 2.3 %   VH DEXTROSE STICK GLUCOSE    Collection Time    03/18/13  4:01 PM       Component Value Range    Glucose, POCT 194 (*) 70 - 99 mg/dL   VH DEXTROSE STICK GLUCOSE    Collection Time    03/18/13  9:34 PM       Component  Value Range    Glucose, POCT 240 (*) 70 - 99 mg/dL   LIPID PANEL    Collection Time    03/19/13  3:07 AM       Component Value Range    Cholesterol 98  75 - 199 mg/dL    Triglycerides 161 (*) 10 - 150 mg/dL    HDL 25 (*) 40 - 55 mg/dL    LDL Calculated 42      Coronary Heart Disease Risk 3.92      VLDL 31  0 - 40   CBC    Collection Time    03/19/13  3:07 AM       Component Value Range    WBC 7.1  4.0 - 11.0 K/cmm    RBC 4.54  4.00 - 5.70 M/cmm    Hemoglobin 14.2  13.0 - 17.5 gm/dL    Hematocrit 09.6 (*) 39.0 - 52.5 %    MCV 83  80 - 100 fL    MCH 31  28 - 35 pg    MCHC 38 (*) 32 - 36 gm/dL    RDW 04.5  40.9 - 81.1 %    PLT CT 211  130 - 440 K/cmm    MPV 7.0  6.0 - 10.0 fL   BASIC METABOLIC PANEL    Collection Time    03/19/13  3:07 AM       Component Value Range    Sodium 141  136 - 147 mMol/L    Potassium 4.0  3.5 - 5.3 mMol/L    Chloride 107  98 - 110 mMol/L    CO2 24.6  20.0 - 30.0 mMol/L    CALCIUM 9.2  8.5 - 10.5 mg/dL    Glucose 914 (*) 70 - 99 mg/dL    Creatinine 7.82  9.56 - 1.30 mg/dL    BUN 17  7 - 22 mg/dL    Anion Gap 21.3  7.0 - 18.0 mMol/L    BUN/Creatinine Ratio 19.1  10.0 - 30.0 Ratio    EGFR >60      Osmolality Calc 288  275 - 300 mOsm/kg   HEMOGLOBIN A1C    Collection Time    03/19/13  3:07 AM       Component Value Range    Hgb A1C, % 10.0         Imaging:  Xr Chest 2 Views    03/18/2013  No acute cardiopulmonary disease.  ReadingStation:[email or web address]     Nm Myocardial Perfusion Spect (stress And Rest)    03/18/2013  Low risk Lexiscan nuclear stress test.         Medications:  Current Facility-Administered Medications   Medication Dose  Route Frequency   . [COMPLETED] acetaminophen  1,000 mg Oral Once   . aspirin  81 mg Oral Daily   . atorvastatin  10 mg Oral Daily   . famotidine  20 mg Oral QHS   . fenofibrate  54 mg Oral Daily   . glipiZIDE  10 mg Oral BID AC   . hydrochlorothiazide  12.5 mg Oral Daily   . hydrOXYzine  50 mg Oral QHS   . insulin aspart  1-5 Units Subcutaneous TID AC & HS   . insulin glargine  10 Units Subcutaneous QHS   . [COMPLETED] insulin regular  10 Units Intravenous Once in ED   . isosorbide mononitrate  30 mg Oral Daily   . metoprolol  100 mg Oral BID   . [COMPLETED] nitroglycerin  1 inch Topical Once in ED   . prasugrel  10 mg Oral Daily   . sitaGLIPtin  100 mg Oral Daily   . sodium chloride (PF)  3 mL Intravenous Q8H   . [DISCONTINUED] aspirin  243 mg Oral Once in ED   . [DISCONTINUED] glimepiride  8 mg Oral QAM AC   . [DISCONTINUED] hydrochlorothiazide  12.5 mg Oral Daily       Assessment:   Chest Pain, low risk stress test 03/18/13  Repeat EKG:  NSR, 73 bpm, nonspecific inferior T wave changes, no change from yesterday    Past Medical History   Diagnosis Date   . Gastroesophageal reflux disease    . Diabetes mellitus    . Hyperlipidemia    . Hypertension    . TIA (transient ischemic attack)    . CAD (coronary artery disease)    . Myocardial infarction    . Hernia    . Presence of stent in coronary artery        Plan:   Catheterization by Dr. Howie Ill planned for this afternoon  SL nitroglycerin for CP  Continued clinical monitoring      Rachel Bo, PA

## 2013-03-19 NOTE — Progress Notes (Signed)
Pt to [facility] room for post procedure site management.  RFA Site assessed, WNL.  Sheath to be pulled at bedside now.  No s/s of complications.  Pt and family instructed on post procedure complications and restrictions.  Verbalizes understanding.  Call bell in reach.  VSS.

## 2013-03-19 NOTE — Discharge Instructions (Signed)
Post Groin Discharge Instructions    1. Keep the dressing in place and dry for the first 24 hours  2. After 24 hours may shower, remove dressing, gently clean area with soap and water while standing in the shower.  Pat dry with towel. Do not apply any powders, lotions, or ointments.  Do not cover the area with another dressing or band aid  -Keep site clean and dry to prevent infection  -Do not sit in tub water or pool of water for 3 days, shower only  3. No lifting, pushing, or pulling anything greater than 10 pounds for 3 days.   4. No deep squatting or stooping for 3 days.   5. You may have some flat bruising and/or mild soreness at the puncture site-this is normal.   6. Notify your physician if you experience chills, fever, or poor wound healing.   7. If you experience bleeding, severe pain, or a sudden painful large knot at the site:       -lie down      -apply firm pressure at the site with your hand      -contact the rescue squad by calling 911  8. Drink additional fluids the evening after your procedure unless told otherwise.   9. You may ride in a car, but do not drive yourself for 24 hours after procedure.   10. If you have any questions, call [phone]  11. You will be receiving a Customer Service Survey in the mail about your care.  Your feedback is very important to us.  Please take a few minutes to fill it out and send it back.

## 2013-03-19 NOTE — Progress Notes (Signed)
8:45 chest pain 8/10  BP 110/58 P 74  #1 NTG given sublingual, O2 @ 2L started.    8:50 chest pain 4/10  BP 111/62 P 83  NTG #2 offered. Pt states he is comfortable at this time.    Will continue to monitor closely.  Tele NSR with occasional PVC's. O2 @ 2L.  Patient placed back on bedside monitor.  Call light within reach.  MD made aware.

## 2013-03-19 NOTE — Progress Notes (Signed)
[facility]  Progress Note    Date Time: 03/19/2013 2:33 PM  Patient Name: Michael [name], Michael [name]  MRN#: [id number]  DOB: [birth year]  Consulting Physician: Dedra Skeens,*    Reason for Consult:   CP    History:   Arlin Sass is a 57 y.o. male who presents with symptoms of CP which he developed   while at doctors' office. Describes that the symptoms started with numbness of his left face,  neck and then his left shoulder and chest started hurting, graded it at 9/10 and said that it   was associated with nausea. Reports similar symptoms prior to his previous stents. Denied  diaphoresis, worsening sob, palpitations, presyncope, syncope. Has been active otherwise   and doing well since his last stent. Pain resolved with three s/l NG,  he started having recurrent similar symptoms.      Past Medical History:     Past Medical History   Diagnosis Date   . Gastroesophageal reflux disease    . Diabetes mellitus    . Hyperlipidemia    . Hypertension    . TIA (transient ischemic attack)    . CAD (coronary artery disease)    . Myocardial infarction    . Hernia    . Presence of stent in coronary artery        Past Surgical History:     Past Surgical History   Procedure Date   . Coronary angioplasty with stent placement      x2    . Neck surgery    . Back surgery    . Coronary angioplasty with stent placement 12/13/12     DES to OM1       Problem List:   Active Problems:   Chest pain      Allergies:     Allergies   Allergen Reactions   . Gabapentin    . Glucophage (Metformin Hydrochloride)    . Keflex (Cephalexin)    . Morphine        Medications:      Current Facility-Administered Medications   Medication Dose Route Frequency   . 0.9% sodium chloride with heparin       . aspirin  81 mg Oral Daily   . atorvastatin  10 mg Oral Daily   . famotidine  20 mg Oral QHS   . fenofibrate  54 mg Oral Daily   . glipiZIDE  10 mg Oral BID AC   . heparin 69629 units/1000 mL sodium chloride 0.9%       .  hydrochlorothiazide  12.5 mg Oral Daily   . hydrOXYzine  50 mg Oral QHS   . insulin aspart  1-5 Units Subcutaneous TID AC & HS   . insulin glargine  10 Units Subcutaneous QHS   . isosorbide mononitrate  30 mg Oral Daily   . metoprolol  100 mg Oral BID   . prasugrel  10 mg Oral Daily   . sitaGLIPtin  100 mg Oral Daily   . sodium chloride (PF)  3 mL Intravenous Q8H     Prior to Admission medications    Medication Sig Start Date End Date Taking? Authorizing Provider   aspirin EC 81 MG EC tablet Take 81 mg by mouth daily. Took 4 81mg  ASA this AM   Yes [provider]   atorvastatin (LIPITOR) 10 MG tablet Take 4 tablets (40 mg total) by mouth nightly. 12/14/12  Yes Orest Dikes, MD   fenofibrate (  TRICOR) 48 MG tablet Take 2 tablets (96 mg total) by mouth daily. 12/14/12  Yes Jhawar, Manish, MD   glimepiride (AMARYL) 4 MG tablet Take 8 mg by mouth every morning before breakfast.   Yes [provider]   glipiZIDE (GLUCOTROL) 10 MG tablet Take 10 mg by mouth 2 (two) times daily before meals.   Yes [provider]   hydrochlorothiazide (HYDRODIURIL) 12.5 MG tablet Take 12.5 mg by mouth daily.   Yes [provider]   hydrOXYzine (VISTARIL) 50 MG capsule Take 50 mg by mouth nightly.   Yes [provider]   ibuprofen (ADVIL,MOTRIN) 800 MG tablet Take 800 mg by mouth every 8 (eight) hours as needed.   Yes [provider]   insulin detemir (LEVEMIR) 100 UNIT/ML injection Inject 60 Units into the skin nightly.   Yes [provider]   insulin glargine (LANTUS) 100 UNIT/ML injection Inject 10 Units into the skin nightly.   Yes [provider]   isosorbide mononitrate (IMDUR) 30 MG 24 hr tablet Take 30 mg by mouth daily.  02/28/13  Yes [provider]   METOPROLOL TARTRATE PO Take 100 mg by mouth 2 (two) times daily.   Yes [provider]   nitroglycerin (NITROSTAT) 0.4 MG SL tablet Place 0.4 mg under the tongue every 5 (five) minutes as needed.    Yes [provider]   prasugrel (EFFIENT) 5 MG Tab Take 2 tablets (10 mg total) by mouth daily. 12/14/12  Yes Jhawar, Evlyn Clines, MD   ranitidine (ZANTAC) 300 MG tablet Take 300 mg by mouth daily.   Yes [provider]   sitaGLIPtin (JANUVIA) 100 MG tablet Take 100 mg by mouth daily.   Yes [provider]   amitriptyline (ELAVIL) 100 MG tablet Take 100 mg by mouth nightly.    [provider]   cyclobenzaprine (FLEXERIL) 10 MG tablet Take 10 mg by mouth every 8 (eight) hours as needed.    [provider]   TraMADol HCl 50 MG Tablet Dispersible Take 1-2 tablets by mouth every 8 (eight) hours as needed. 50 mg    [provider]       Family History:     Family History   Problem Relation Age of Onset   . Heart disease Mother    . Stroke Mother    . Diabetes Mother    . Heart attack Mother    . Heart disease Father    . Stroke Father    . Heart attack Father    . Diabetes Sister        Social History:     History     Social History   . Marital Status: Single     Spouse Name: N/A     Number of Children: N/A   . Years of Education: N/A     Occupational History   . Not on file.     Social History Main Topics   . Smoking status: Never Smoker    . Smokeless tobacco: Never Used   . Alcohol Use: No   . Drug Use: No   . Sexually Active:      Other Topics Concern   . Not on file     Social History Narrative   . No narrative on file       Review of Systems:     All systems reviewed and negative other than those noted in the HPI.    Physical Exam:  Filed Vitals:    03/19/13 1027   BP: 108/63   Pulse: 82   Temp: 97.7 F (36.5 C)   Resp: 18   SpO2: 99%       Intake and Output Summary (Last 24 hours) at Date Time  No intake or output data in the 24 hours ending 03/19/13 1433    General appearance - alert, well appearing, and in no distress  Head - Normocephalic, atraumatic  Throat - Moist mucous membranes  Eyes - pupils equal and reactive, extraocular eye movements intact  Neck -  supple, no significant adenopathy, no thyromegaly, no masses, no JVD, no carotid bruit  Chest - clear to auscultation, no wheezes, rales or rhonchi  Heart - normal rate, regular rhythm, normal S1, S2, no murmurs, rubs, clicks or gallops  Abdomen - soft, nontender, nondistended, no masses or organomegaly, bowel sounds heard in all four quadrants  Neurological - alert, awake, oriented, non focal otherwise  Extremities - peripheral pulses normal, no pedal edema, no clubbing or cyanosis  Skin - normal coloration and turgor, no rashes, no suspicious skin lesions noted    Labs Reviewed:   Recent CMP     Lab 03/19/13 0307 03/18/13 0922   GLU 187* 401*   BUN 17 17   CREAT 0.89 0.94   NA 141 138   K 4.0 4.3   MG -- --   CO2 24.6 26.1   CA 9.2 9.5   AST -- 18   ALT -- 30   BILITOTAL -- 1.4*   GLOB -- 2.9       Recent CARDIAC ENZYMES     Lab 03/18/13 1317 03/18/13 0922   TROPI 0.00 0.00   CK 165 192   CKMB 4.8 5.6   CKMBINDEX NI NI   BNP -- --       Recent TSH No results found for this basename: TSH:10 in the last 168 hours    Recent PT/PTT     Lab 03/18/13 0922   PT 12.0*   INR 1.2   APTT 22.6*       Recent CBC WITH DIFF     Lab 03/19/13 0307 03/18/13 0922   WBC 7.1 5.0   RBC 4.54 4.35   HGB 14.2 13.3   HCT 37.8* 36.2*   MCV 83 83   MPV 7.0 7.1   PLT 211 204       Recent LIPID PANEL   Cholesterol (mg/dL)   Date Value   40/12/8117 98    12/13/2012 126    10/20/2011 155     Triglycerides (mg/dL)   Date Value   14/10/8293 154*   12/13/2012 678*   10/20/2011 286*    HDL (mg/dL)   Date Value   62/04/3084 25*   12/13/2012 24*   10/20/2011 32*    LDL Calculated (mg/dL)   Date Value   57/11/4694 42    12/13/2012 Unable to calculate due to triglycerides >400 mg/dl    05/27/5282 66         Rads:     Radiology Results (24 Hour)     Procedure Component Value Units Date/Time    NM Myocardial Perfusion Spect (Stress And Rest) [[id number]]  (Normal) Collected:03/18/13 1548    Order Status:Completed  Updated:03/18/13 2224    Narrative:    REQUESTING  PHYSICIAN: Rachel Bo, PA    INT PHYSICIAN: Orest Dikes, MD    HEIGHT: --    WEIGHT: --    BLOOD  PRESSURE: --    TYPE OF REPORT: Lexiscan nuclear stress test.    TIME OF TEST: --    INDICATION: Chest pain.    INTERPRETATION: Baseline EKG shows normal sinus rhythm.  No  acute ST-T wave changes or arrhythmias noted.    Patient attempted treadmill but had to stopped at 3 minutes 44  seconds due to leg cramps.  Stress was induced after injecting  0.4 mg Lexiscan over 15 seconds.    At peak stress, no acute ST-T wave changes or arrhythmias noted.    Normal hemodynamics.    Baseline images were obtained after injecting 15 mCi of  sestamibi.  Immediately after the injection of Lexiscan, 49.1  mCi of sestamibi was injected following which the stress images  were obtained.    The raw images do not show any significant motion artifact.  No  significant interfering extracardiac activity noted.    Comparing the baseline and the stress images, no significant  fixed or reversible perfusion defect noted.    Gated imaging shows normal EF.      Impression:    Low risk Lexiscan nuclear stress test.        CV Cardiac Stress Test Tracing Only [[id number]]     Order Status:Sent  Updated:03/18/13 1600          Cardiovascular Workup:   EKG:  Sinus rhythm    Cardiac stress test (03/18/2013)  Low risk     Cath (11/2012)  PREOPERATIVE DIAGNOSIS:   1. Symptoms of crescendo angina, with a severely abnormal   stress test.   2. History of coronary artery disease, status post PCI in   the past.   POSTPROCEDURE DIAGNOSIS:   1. 99% proximal OM2 stenosis status post percutaneous   balloon angioplasty followed by drug-eluting stent placement,   Xience 3.25 x 23 mm.   2. Prior stents in RCA and proximal left circumflex noted   to be patent.      Assessment:   57 yo with HTN, HLD, DM, TIA, CAD s/p PCI in the past presenting with symptoms of   unstable angina. AMI was ruled out. Stress test was unremarkable.     Recommendations:     1. Referred for  cath by Dr Howie Ill after he offered pt continued medical management Vs cath and he opts for the later. Will proceed with LHC and coronary angio Informed consent signed  2. Continue home dose of Asa, Effient, BB, Statin, Fenofibrate, Imdur       Thank you for consulting [facility] and allowing [place] to participate in this pt's care! We will follow along while the pt is in the hospital.    Signed by: Brayton Caves, MD

## 2013-03-19 NOTE — UM Notes (Signed)
[facility] Utilization Management Review Sheet    NAME: Michael [name]  MR#: [id number]    CSN#: [id number]    ROOM: 2502/2502-A AGE: 57 y.o.    ADMIT DATE AND TIME: 03/18/2013  9:10 AM      PATIENT CLASS:OBS    ATTENDING PHYSICIAN: Dedra Skeens,*  PAYOR:Payor: OUT OF STATE BLUE CROSS  Plan: BCBS BLUE CARD OUT OF STATE  Product Type: *No Product type*        AUTH #: [id number]  SW LARRY S FROM [employer]  REQ AUTH FOR OBS / STRESS TEST [id number] / L[facility] [id number]  NO AUTH NEEDED FOR OBS / LHC/ STRESS TEST PER PT'S PPO POLICY AND GEOGRAPHIC AREA    DIAGNOSIS:Active Problems:   Chest pain    Chest Pain: Observation Care  OCG: OC-009 (ISC)   Observation Care Admission Criteria  Observation may be appropriate for patient with chest pain and 1 or more of the following:   Suspected cardiac ischemia with nondiagnostic initial evaluation (eg, ECG, cardiac biomarkers) requiring further immediate evaluation such as stress testing, imaging, and repeat laboratory testing to clarify diagnosis   Other suspected diagnosis requiring observation and monitoring during diagnostic evaluation (eg, pulmonary embolus, aortic dissection, pneumothorax, pericarditis, GI bleeding       HISTORY:   Past Medical History   Diagnosis Date   . Gastroesophageal reflux disease    . Diabetes mellitus    . Hyperlipidemia    . Hypertension    . TIA (transient ischemic attack)    . CAD (coronary artery disease)    . Myocardial infarction    . Hernia    . Presence of stent in coronary artery        DATE OF REVIEW: 03/19/2013    VITALS: BP 102/58  Pulse 74  Temp 97.5 F (36.4 C) (Oral)  Resp 18  Ht 1.753 m (5\' 9" )  Wt 93.441 kg (206 lb)  BMI 30.41 kg/m2  SpO2 98%      Medications:     Current Facility-Administered Medications   Medication Dose Route Frequency   . [COMPLETED] acetaminophen  1,000 mg Oral Once   . aspirin  81 mg Oral Daily   . atorvastatin  10 mg Oral Daily   . famotidine  20 mg Oral QHS   . fenofibrate  54 mg Oral Daily   . glipiZIDE  10 mg  Oral BID AC   . hydrochlorothiazide  12.5 mg Oral Daily   . hydrOXYzine  50 mg Oral QHS   . insulin aspart  1-5 Units Subcutaneous TID AC & HS   . insulin glargine  10 Units Subcutaneous QHS   . [COMPLETED] insulin regular  10 Units Intravenous Once in ED   . isosorbide mononitrate  30 mg Oral Daily   . metoprolol  100 mg Oral BID   . [COMPLETED] nitroglycerin  1 inch Topical Once in ED   . prasugrel  10 mg Oral Daily   . sitaGLIPtin  100 mg Oral Daily   . sodium chloride (PF)  3 mL Intravenous Q8H   . [DISCONTINUED] aspirin  243 mg Oral Once in ED   . [DISCONTINUED] glimepiride  8 mg Oral QAM AC   . [DISCONTINUED] hydrochlorothiazide  12.5 mg Oral Daily         Filed Vitals:    03/19/13 0645   BP: 102/58   Pulse: 74   Temp: 97.5 F (36.4 C)   Resp: 18   SpO2: 98%  I  Labs:     Results     Procedure Component Value Units Date/Time    Hemoglobin A1c [[id number]] Collected:03/19/13 0307    Specimen Information:Blood / Blood Updated:03/19/13 0551     Hgb A1C, % 10.0 %     CBC [[id number]]  (Abnormal) Collected:03/19/13 0307    Specimen Information:Blood / Blood Updated:03/19/13 0354     WBC 7.1 K/cmm      RBC 4.54 M/cmm      Hemoglobin 14.2 gm/dL      Hematocrit 96.2 (L) %      MCV 83 fL      MCH 31 pg      MCHC 38 (H) gm/dL      RDW 95.2 %      PLT CT 211 K/cmm      MPV 7.0 fL     Lipid panel (Fasting) [[id number]]  (Abnormal) Collected:03/19/13 0307    Specimen Information:Blood / Plasma Updated:03/19/13 0347     Cholesterol 98 mg/dL      Triglycerides 027 (H) mg/dL      HDL 25 (L) mg/dL      LDL Calculated 42 mg/dL      Coronary Heart Disease Risk 3.92      VLDL 31     Basic metabolic panel [[id number]]  (Abnormal) Collected:03/19/13 0307    Specimen Information:Blood / Plasma Updated:03/19/13 0347     Sodium 141 mMol/L      Potassium 4.0 mMol/L      Chloride 107 mMol/L      CO2 24.6 mMol/L      CALCIUM 9.2 mg/dL      Glucose 474 (H) mg/dL      Creatinine 2.59 mg/dL      BUN 17 mg/dL      Anion Gap 56.3 mMol/L       BUN/Creatinine Ratio 19.1 Ratio      EGFR >60 mL/min/1.40m2      Osmolality Calc 288 mOsm/kg     Dextrose Stick Glucose [[id number]]  (Abnormal) Collected:03/18/13 2134    Specimen Information:Blood Updated:03/18/13 2150     Glucose, POCT 240 (H) mg/dL     Dextrose Stick Glucose [[id number]]  (Abnormal) Collected:03/18/13 1601    Specimen Information:Blood Updated:03/18/13 1619     Glucose, POCT 194 (H) mg/dL     Cardiac Profile with Troponin [[id number]] Collected:03/18/13 1317    Specimen Information:Plasma Updated:03/18/13 1400     Creatinine Kinase MB (CKMB) 4.8 ng/mL      Creatine Kinase (CK) 165 U/L      Troponin I 0.00 ng/mL      CKMB Index NI %     Dextrose Stick Glucose [[id number]]  (Abnormal) Collected:03/18/13 1141    Specimen Information:Blood Updated:03/18/13 1158     Glucose, POCT 239 (H) mg/dL     Cardiac Profile [[id number]] Collected:03/18/13 0922    Specimen Information:Plasma Updated:03/18/13 1038     Creatinine Kinase MB (CKMB) 5.6 ng/mL      Creatine Kinase (CK) 192 U/L      CKMB Index NI %     Troponin I [[id number]] Collected:03/18/13 0922    Specimen Information:Blood / Plasma Updated:03/18/13 1038     Troponin I 0.00 ng/mL     Comprehensive metabolic panel [[id number]]  (Abnormal) Collected:03/18/13 0922    Specimen Information:Blood / Plasma Updated:03/18/13 1028     Sodium 138 mMol/L      Potassium 4.3 mMol/L      Chloride 105 mMol/L      CO2 26.1 mMol/L  CALCIUM 9.5 mg/dL      Glucose 628 (H) mg/dL      Creatinine 3.15 mg/dL      BUN 17 mg/dL      Protein, Total 6.6 gm/dL      Albumin 3.7 gm/dL      Alkaline Phosphatase 51 U/L      ALT 30 U/L      AST (SGOT) 18 U/L      Bilirubin, Total 1.4 (H) mg/dL      Albumin/Globulin Ratio 1.28 Ratio      Anion Gap 11.2 mMol/L      BUN/Creatinine Ratio 18.1 Ratio      EGFR >60 mL/min/1.35m2      Osmolality Calc 294 mOsm/kg      Globulin 2.9 gm/dL     PT/APTT [[id number]]  (Abnormal) Collected:03/18/13 0922    Specimen Information:Blood Updated:03/18/13  1010     PT 12.0 (H) sec      PT INR 1.2      aPTT 22.6 (L) sec     CBC and differential [[id number]]  (Abnormal) Collected:03/18/13 0922    Specimen Information:Blood / Blood Updated:03/18/13 1001     WBC 5.0 K/cmm      RBC 4.35 M/cmm      Hemoglobin 13.3 gm/dL      Hematocrit 46.2 (L) %      MCV 83 fL      MCH 31 pg      MCHC 37 (H) gm/dL      RDW 70.3 %      PLT CT 204 K/cmm      MPV 7.1 fL      NEUTROPHIL % 46.8 %      Lymphocytes 40.8 %      Monocytes 7.9 %      Eosinophils % 3.6 %      Basophils % 0.9 %      Neutrophils Absolute 2.3 K/cmm      Lymphocytes Absolute 2.0 K/cmm      Monocytes Absolute 0.4 K/cmm      Eosinophils Absolute 0.2 K/cmm      BASO Absolute 0.0 K/cmm                 Lab Results   Component Value Date    WBC 7.1 03/19/2013    HGB 14.2 03/19/2013    PLT 211 03/19/2013    NA 141 03/19/2013    K 4.0 03/19/2013    BUN 17 03/19/2013    CREAT 0.89 03/19/2013    AST 18 03/18/2013    ALB 3.7 03/18/2013    INR 1.2 03/18/2013    LDL 42 03/19/2013       Rads:     XR CHEST 2 VIEWS     Final Result:  No acute cardiopulmonary disease.               EKG: Sinus rhythm; negative for ischemic changes. HR 72            MD NOTE:  a 57 y.o. male who presented with chest pain to the ED this morning. He was sitting in the waiting room at his pain mgmt doctor when he experienced 9/10 sternal CP with radiation to the left neck and shoulder. The pain required him to take 3 nitroglycerin before resolving. He had associated nausea and lightheadedness. He denies diaphoresis or SOB.   He is an IDDM. Denies tobacco use. States he currently has a 4/10 "mild" chest pressure. He was  evaluated in the ED and found to have blood glucose of 400. Remainder of labs and EKG were unremarkable.     EMERGENCY DEPARTMENT COURSE      ED Medication Orders      Start    Status  Ordering Provider      03/18/13 1057     Once in ED, Status: Discontinued  Discontinued  KOCHINSKY, JEROME T         Route: Oral Ordered Dose: 243 mg                   03/18/13 1043    insulin regular (HumuLIN R,NovoLIN R) injection 10 Units Once in ED  Last MAR action: Given  KOCHINSKY, JEROME T         Route: Intravenous Ordered Dose: 10 Units                  03/18/13 1030    acetaminophen (TYLENOL) tablet 1,000 mg Once  Last MAR action: Given  KOCHINSKY, JEROME T         Route: Oral Ordered Dose: 1,000 mg                  03/18/13 0946    nitroglycerin (NITRO-BID) 2 % ointment 1 inch Once in ED  Last MAR action: Given  KOCHINSKY, JEROME T         Route: Topical Ordered Dose: 1 inch                         ASSESSMENT & PLAN     a 56 y.o. male admitted under OBSERVATION with Chest Pain and PMH of GERD, IDDM, hyperlipidemia, HTN, TIA, CAD, obesity, and MI.   Assessment & Plan:   1. Admit to Observation Unit for cardiac work up.   -Telemetry   -Serial cardiac enzymes.   -Repeat ECG   -Fasting Lipid Panel in AM   -Nitrates for pain as needed   -Nuclear Stress Test this afternoon   -Consult Dr. Howie Ill

## 2013-03-19 NOTE — Progress Notes (Signed)
Pt BR complete.  Ambulated in hall and to RR.  Tolerated well.  No s/s of complications.  VSS.

## 2013-03-19 NOTE — Progress Notes (Signed)
Pt and friend given d/c instructions, f/u appts, and prescriptions.  Verbalizes understanding. IV removed.  Questions encouraged and answered.  Pt denies questions or needs.  Taken to ED lobby for d/c with belongings to go home with family.

## 2013-03-19 NOTE — Progress Notes (Signed)
This patient presented to the Observation Unit today and was initially seen by Physician Assistant Nicole Hoffman.  This patient was seen and examined by me and I agree with the management/treatment and plan. Pradyun Ishman J Merwyn Hodapp, MD

## 2013-03-19 NOTE — Discharge Summary (Signed)
[facility]  Discharge Summary    Date Time: 03/19/2013 3:04 PM  Patient Name: Michael [name], Michael [name]  MRN#: [id number]  DOB: [birth year]  Consulting Physician: Dedra Skeens,*    Reason for Consult:   CP    History:   Kollen Armenti is a 57 y.o. male who presents with symptoms of CP which he developed   while at doctors' office. Describes that the symptoms started with numbness of his left face,  neck and then his left shoulder and chest started hurting, graded it at 9/10 and said that it   was associated with nausea. Reports similar symptoms prior to his previous stents. Denied  diaphoresis, worsening sob, palpitations, presyncope, syncope. Has been active otherwise   and doing well since his last stent. Pain resolved with three s/l NG,  he started having recurrent similar symptoms.  LHC shows patent stents in the CRCA and LCx -Om and NL LV suys/dias fx.        Past Medical History:     Past Medical History   Diagnosis Date   . Gastroesophageal reflux disease    . Diabetes mellitus    . Hyperlipidemia    . Hypertension    . TIA (transient ischemic attack)    . CAD (coronary artery disease)    . Myocardial infarction    . Hernia    . Presence of stent in coronary artery        Past Surgical History:     Past Surgical History   Procedure Date   . Coronary angioplasty with stent placement      x2    . Neck surgery    . Back surgery    . Coronary angioplasty with stent placement 12/13/12     DES to OM1       Problem List:   Active Problems:   Chest pain      Allergies:     Allergies   Allergen Reactions   . Gabapentin    . Glucophage (Metformin Hydrochloride)    . Keflex (Cephalexin)    . Morphine        Medications:      Current Facility-Administered Medications   Medication Dose Route Frequency   . [COMPLETED] 0.9% sodium chloride with heparin       . aspirin  81 mg Oral Daily   . atorvastatin  10 mg Oral Daily   . famotidine  20 mg Oral QHS   . fenofibrate  54 mg Oral Daily   .  [COMPLETED] fentaNYL       . glipiZIDE  10 mg Oral BID AC   . [COMPLETED] heparin 53664 units/1000 mL sodium chloride 0.9%       . hydrochlorothiazide  12.5 mg Oral Daily   . hydrOXYzine  50 mg Oral QHS   . insulin aspart  1-5 Units Subcutaneous TID AC & HS   . insulin glargine  10 Units Subcutaneous QHS   . [COMPLETED] iodixanol  50 mL Intra-arterial Once   . isosorbide mononitrate  30 mg Oral Daily   . [COMPLETED] lidocaine       . metoprolol  100 mg Oral BID   . [COMPLETED] midazolam       . prasugrel  10 mg Oral Daily   . sitaGLIPtin  100 mg Oral Daily   . sodium chloride (PF)  3 mL Intravenous Q8H     Prior to Admission medications    Medication Sig Start  Date End Date Taking? Authorizing Provider   aspirin EC 81 MG EC tablet Take 81 mg by mouth daily. Took 4 81mg  ASA this AM   Yes [provider]   atorvastatin (LIPITOR) 10 MG tablet Take 4 tablets (40 mg total) by mouth nightly. 12/14/12  Yes Jhawar, Evlyn Clines, MD   fenofibrate (TRICOR) 48 MG tablet Take 2 tablets (96 mg total) by mouth daily. 12/14/12  Yes Jhawar, Manish, MD   glimepiride (AMARYL) 4 MG tablet Take 8 mg by mouth every morning before breakfast.   Yes [provider]   glipiZIDE (GLUCOTROL) 10 MG tablet Take 10 mg by mouth 2 (two) times daily before meals.   Yes [provider]   hydrochlorothiazide (HYDRODIURIL) 12.5 MG tablet Take 12.5 mg by mouth daily.   Yes [provider]   hydrOXYzine (VISTARIL) 50 MG capsule Take 50 mg by mouth nightly.   Yes [provider]   ibuprofen (ADVIL,MOTRIN) 800 MG tablet Take 800 mg by mouth every 8 (eight) hours as needed.   Yes [provider]   insulin detemir (LEVEMIR) 100 UNIT/ML injection Inject 60 Units into the skin nightly.   Yes [provider]   insulin glargine (LANTUS) 100 UNIT/ML injection Inject 10 Units into the skin nightly.   Yes [provider]   isosorbide mononitrate (IMDUR) 30 MG 24 hr tablet Take 30 mg by mouth daily.   02/28/13  Yes [provider]   METOPROLOL TARTRATE PO Take 100 mg by mouth 2 (two) times daily.   Yes [provider]   nitroglycerin (NITROSTAT) 0.4 MG SL tablet Place 0.4 mg under the tongue every 5 (five) minutes as needed.   Yes [provider]   prasugrel (EFFIENT) 5 MG Tab Take 2 tablets (10 mg total) by mouth daily. 12/14/12  Yes Jhawar, Evlyn Clines, MD   ranitidine (ZANTAC) 300 MG tablet Take 300 mg by mouth daily.   Yes [provider]   sitaGLIPtin (JANUVIA) 100 MG tablet Take 100 mg by mouth daily.   Yes [provider]   amitriptyline (ELAVIL) 100 MG tablet Take 100 mg by mouth nightly.    [provider]   cyclobenzaprine (FLEXERIL) 10 MG tablet Take 10 mg by mouth every 8 (eight) hours as needed.    [provider]   TraMADol HCl 50 MG Tablet Dispersible Take 1-2 tablets by mouth every 8 (eight) hours as needed. 50 mg    [provider]       Family History:     Family History   Problem Relation Age of Onset   . Heart disease Mother    . Stroke Mother    . Diabetes Mother    . Heart attack Mother    . Heart disease Father    . Stroke Father    . Heart attack Father    . Diabetes Sister        Social History:     History     Social History   . Marital Status: Single     Spouse Name: N/A     Number of Children: N/A   . Years of Education: N/A     Occupational History   . Not on file.     Social History Main Topics   . Smoking status: Never Smoker    . Smokeless tobacco: Never Used   . Alcohol Use: No   . Drug Use: No   . Sexually Active:  Other Topics Concern   . Not on file     Social History Narrative   . No narrative on file       Review of Systems:     All systems reviewed and negative other than those noted in the HPI.    Physical Exam:     Filed Vitals:    03/19/13 1027   BP: 108/63   Pulse: 82   Temp: 97.7 F (36.5 C)   Resp: 18   SpO2: 99%       Intake and Output Summary (Last 24 hours) at Date Time  No intake or output data  in the 24 hours ending 03/19/13 1504    General appearance - alert, well appearing, and in no distress  Head - Normocephalic, atraumatic  Throat - Moist mucous membranes  Eyes - pupils equal and reactive, extraocular eye movements intact  Neck - supple, no significant adenopathy, no thyromegaly, no masses, no JVD, no carotid bruit  Chest - clear to auscultation, no wheezes, rales or rhonchi  Heart - normal rate, regular rhythm, normal S1, S2, no murmurs, rubs, clicks or gallops  Abdomen - soft, nontender, nondistended, no masses or organomegaly, bowel sounds heard in all four quadrants  Neurological - alert, awake, oriented, non focal otherwise  Extremities - peripheral pulses normal, no pedal edema, no clubbing or cyanosis  Skin - normal coloration and turgor, no rashes, no suspicious skin lesions noted    Labs Reviewed:   Recent CMP     Lab 03/19/13 0307 03/18/13 0922   GLU 187* 401*   BUN 17 17   CREAT 0.89 0.94   NA 141 138   K 4.0 4.3   MG -- --   CO2 24.6 26.1   CA 9.2 9.5   AST -- 18   ALT -- 30   BILITOTAL -- 1.4*   GLOB -- 2.9       Recent CARDIAC ENZYMES     Lab 03/18/13 1317 03/18/13 0922   TROPI 0.00 0.00   CK 165 192   CKMB 4.8 5.6   CKMBINDEX NI NI   BNP -- --       Recent TSH No results found for this basename: TSH:10 in the last 168 hours    Recent PT/PTT     Lab 03/18/13 0922   PT 12.0*   INR 1.2   APTT 22.6*       Recent CBC WITH DIFF     Lab 03/19/13 0307 03/18/13 0922   WBC 7.1 5.0   RBC 4.54 4.35   HGB 14.2 13.3   HCT 37.8* 36.2*   MCV 83 83   MPV 7.0 7.1   PLT 211 204       Recent LIPID PANEL   Cholesterol (mg/dL)   Date Value   02/24/1477 98    12/13/2012 126    10/20/2011 155     Triglycerides (mg/dL)   Date Value   29/08/6211 154*   12/13/2012 678*   10/20/2011 286*    HDL (mg/dL)   Date Value   11/22/5782 25*   12/13/2012 24*   10/20/2011 32*    LDL Calculated (mg/dL)   Date Value   69/09/2950 42    12/13/2012 Unable to calculate due to triglycerides >400 mg/dl    11/20/1322 66         Rads:     Radiology  Results (24 Hour)     Procedure Component Value Units Date/Time  NM Myocardial Perfusion Spect (Stress And Rest) [[id number]]  (Normal) Collected:03/18/13 1548    Order Status:Completed  Updated:03/18/13 2224    Narrative:    REQUESTING PHYSICIAN: Rachel Bo, PA    INT PHYSICIAN: Orest Dikes, MD    HEIGHT: --    WEIGHT: --    BLOOD PRESSURE: --    TYPE OF REPORT: Lexiscan nuclear stress test.    TIME OF TEST: --    INDICATION: Chest pain.    INTERPRETATION: Baseline EKG shows normal sinus rhythm.  No  acute ST-T wave changes or arrhythmias noted.    Patient attempted treadmill but had to stopped at 3 minutes 44  seconds due to leg cramps.  Stress was induced after injecting  0.4 mg Lexiscan over 15 seconds.    At peak stress, no acute ST-T wave changes or arrhythmias noted.    Normal hemodynamics.    Baseline images were obtained after injecting 15 mCi of  sestamibi.  Immediately after the injection of Lexiscan, 49.1  mCi of sestamibi was injected following which the stress images  were obtained.    The raw images do not show any significant motion artifact.  No  significant interfering extracardiac activity noted.    Comparing the baseline and the stress images, no significant  fixed or reversible perfusion defect noted.    Gated imaging shows normal EF.      Impression:    Low risk Lexiscan nuclear stress test.        CV Cardiac Stress Test Tracing Only [[id number]]     Order Status:Sent  Updated:03/18/13 1600          Cardiovascular Workup:   EKG:  Sinus rhythm    Cardiac stress test (03/18/2013)  Low risk     Cath (11/2012)  PREOPERATIVE DIAGNOSIS:   1. Symptoms of crescendo angina, with a severely abnormal   stress test.   2. History of coronary artery disease, status post PCI in   the past.   POSTPROCEDURE DIAGNOSIS:   1. 99% proximal OM2 stenosis status post percutaneous   balloon angioplasty followed by drug-eluting stent placement,   Xience 3.25 x 23 mm.   2. Prior stents in RCA and proximal left  circumflex noted   to be patent.      Assessment:   57 yo with HTN, HLD, DM, TIA, CAD s/p PCI in the past presenting with symptoms of   unstable angina. AMI was ruled out. Stress test was unremarkable.     Recommendations:     1. Referred for cath by Dr Howie Ill after he offered pt continued medical management Vs cath and he opts for the later. LHC and coronary angio shows patent recent stented site and small significant D branch dx.    2. Continue home dose of Asa, Effient, BB, Statin, Fenofibrate, Imdur  3. F/u with Dr. Howie Ill in 2-3 wks       Thank you for consulting [facility] and allowing [place] to participate in this pt's care! We will follow along while the pt is in the hospital.    Signed by: Brayton Caves, MD

## 2013-08-28 ENCOUNTER — Ambulatory Visit
Admission: RE | Admit: 2013-08-28 | Discharge: 2013-08-28 | Disposition: A | Discharge status: Home / Self Care | Payer: BC Managed Care – PPO | Source: Ambulatory Visit | Attending: Registered Nurse | Admitting: Registered Nurse

## 2013-08-28 ENCOUNTER — Other Ambulatory Visit: Payer: Self-pay

## 2013-08-28 ENCOUNTER — Other Ambulatory Visit: Payer: Self-pay | Admitting: Registered Nurse

## 2013-08-28 ENCOUNTER — Ambulatory Visit
Admission: RE | Admit: 2013-08-28 | Discharge: 2013-08-28 | Disposition: A | Discharge status: Home / Self Care | Payer: BC Managed Care – PPO | Source: Ambulatory Visit

## 2013-08-28 DIAGNOSIS — M76899 Other specified enthesopathies of unspecified lower limb, excluding foot: Secondary | ICD-10-CM | POA: Insufficient documentation

## 2013-08-28 DIAGNOSIS — M25559 Pain in unspecified hip: Secondary | ICD-10-CM

## 2013-09-23 ENCOUNTER — Observation Stay
Admission: AD | Admit: 2013-09-23 | Discharge: 2013-09-24 | Disposition: A | Discharge status: Home / Self Care | Payer: BC Managed Care – PPO | Source: Other Acute Inpatient Hospital | Attending: Internal Medicine | Admitting: Internal Medicine

## 2013-09-23 ENCOUNTER — Observation Stay: Payer: BC Managed Care – PPO | Admitting: Internal Medicine

## 2013-09-23 DIAGNOSIS — K219 Gastro-esophageal reflux disease without esophagitis: Secondary | ICD-10-CM | POA: Insufficient documentation

## 2013-09-23 DIAGNOSIS — R079 Chest pain, unspecified: Principal | ICD-10-CM | POA: Insufficient documentation

## 2013-09-23 DIAGNOSIS — E1142 Type 2 diabetes mellitus with diabetic polyneuropathy: Secondary | ICD-10-CM | POA: Insufficient documentation

## 2013-09-23 DIAGNOSIS — Z7902 Long term (current) use of antithrombotics/antiplatelets: Secondary | ICD-10-CM | POA: Insufficient documentation

## 2013-09-23 DIAGNOSIS — E1149 Type 2 diabetes mellitus with other diabetic neurological complication: Secondary | ICD-10-CM | POA: Insufficient documentation

## 2013-09-23 DIAGNOSIS — Z79899 Other long term (current) drug therapy: Secondary | ICD-10-CM | POA: Insufficient documentation

## 2013-09-23 DIAGNOSIS — R110 Nausea: Secondary | ICD-10-CM | POA: Insufficient documentation

## 2013-09-23 DIAGNOSIS — I498 Other specified cardiac arrhythmias: Secondary | ICD-10-CM | POA: Insufficient documentation

## 2013-09-23 DIAGNOSIS — I252 Old myocardial infarction: Secondary | ICD-10-CM | POA: Insufficient documentation

## 2013-09-23 DIAGNOSIS — M6281 Muscle weakness (generalized): Secondary | ICD-10-CM | POA: Insufficient documentation

## 2013-09-23 DIAGNOSIS — Z8673 Personal history of transient ischemic attack (TIA), and cerebral infarction without residual deficits: Secondary | ICD-10-CM | POA: Insufficient documentation

## 2013-09-23 DIAGNOSIS — I10 Essential (primary) hypertension: Secondary | ICD-10-CM | POA: Insufficient documentation

## 2013-09-23 DIAGNOSIS — R42 Dizziness and giddiness: Secondary | ICD-10-CM | POA: Insufficient documentation

## 2013-09-23 DIAGNOSIS — I2510 Atherosclerotic heart disease of native coronary artery without angina pectoris: Secondary | ICD-10-CM | POA: Insufficient documentation

## 2013-09-23 DIAGNOSIS — Z7982 Long term (current) use of aspirin: Secondary | ICD-10-CM | POA: Insufficient documentation

## 2013-09-23 DIAGNOSIS — Z9861 Coronary angioplasty status: Secondary | ICD-10-CM | POA: Insufficient documentation

## 2013-09-23 DIAGNOSIS — Z794 Long term (current) use of insulin: Secondary | ICD-10-CM | POA: Insufficient documentation

## 2013-09-23 DIAGNOSIS — F3289 Other specified depressive episodes: Secondary | ICD-10-CM | POA: Insufficient documentation

## 2013-09-23 DIAGNOSIS — E785 Hyperlipidemia, unspecified: Secondary | ICD-10-CM | POA: Insufficient documentation

## 2013-09-23 HISTORY — DX: Polyneuropathy, unspecified: G62.9

## 2013-09-23 LAB — TROPONIN I
Troponin I: <0.01 ng/mL (ref 0.00–0.02)
Troponin I: <0.01 ng/mL (ref 0.00–0.02)

## 2013-09-23 LAB — CK
Creatine Kinase (CK): 91 U/L (ref 30–230)
Creatine Kinase (CK): 90 U/L (ref 30–230)

## 2013-09-23 LAB — VH DEXTROSE STICK GLUCOSE
Glucose POCT: 294 mg/dL — ABNORMAL HIGH (ref 70–99)
Glucose POCT: 280 mg/dL — ABNORMAL HIGH (ref 70–99)

## 2013-09-23 MED ORDER — INSULIN GLARGINE 100 UNIT/ML SC SOPN
10.0000 [IU] | PEN_INJECTOR | Freq: Every evening | SUBCUTANEOUS | Status: DC
Start: 2013-09-23 — End: 2013-09-24
  Administered 2013-09-23: 10 [IU] via SUBCUTANEOUS
  Filled 2013-09-23: qty 3

## 2013-09-23 MED ORDER — CYCLOBENZAPRINE HCL 10 MG PO TABS
10.0000 mg | ORAL_TABLET | Freq: Three times a day (TID) | ORAL | Status: DC | PRN
Start: 2013-09-23 — End: 2013-09-24
  Administered 2013-09-24: 10 mg via ORAL
  Filled 2013-09-23 (×7): qty 1

## 2013-09-23 MED ORDER — HYDROXYZINE PAMOATE 50 MG PO CAPS
50.0000 mg | ORAL_CAPSULE | Freq: Every evening | ORAL | Status: DC
Start: 2013-09-23 — End: 2013-09-24
  Administered 2013-09-23: 50 mg via ORAL
  Filled 2013-09-23 (×2): qty 1

## 2013-09-23 MED ORDER — CLOPIDOGREL BISULFATE 75 MG PO TABS
75.0000 mg | ORAL_TABLET | Freq: Every day | ORAL | Status: DC
Start: 2013-09-23 — End: 2013-09-24
  Administered 2013-09-24: 75 mg via ORAL
  Filled 2013-09-23 (×2): qty 1

## 2013-09-23 MED ORDER — ATORVASTATIN CALCIUM 40 MG PO TABS
40.0000 mg | ORAL_TABLET | Freq: Every evening | ORAL | Status: DC
Start: 2013-09-23 — End: 2013-09-24
  Administered 2013-09-23: 40 mg via ORAL
  Filled 2013-09-23 (×2): qty 1

## 2013-09-23 MED ORDER — FENOFIBRATE 54 MG PO TABS
54.0000 mg | ORAL_TABLET | Freq: Every day | ORAL | Status: DC
Start: 2013-09-23 — End: 2013-09-24
  Administered 2013-09-24: 54 mg via ORAL
  Filled 2013-09-23 (×2): qty 1

## 2013-09-23 MED ORDER — DOCUSATE SODIUM 100 MG PO CAPS
100.0000 mg | ORAL_CAPSULE | Freq: Two times a day (BID) | ORAL | Status: DC | PRN
Start: 2013-09-23 — End: 2013-09-24

## 2013-09-23 MED ORDER — GLUCOSE 40 % PO GEL
15.0000 g | ORAL | Status: DC | PRN
Start: 2013-09-23 — End: 2013-09-24

## 2013-09-23 MED ORDER — SITAGLIPTIN PHOSPHATE 100 MG PO TABS
100.0000 mg | ORAL_TABLET | Freq: Every day | ORAL | Status: DC
Start: 2013-09-23 — End: 2013-09-24
  Administered 2013-09-24: 100 mg via ORAL
  Filled 2013-09-23 (×2): qty 1

## 2013-09-23 MED ORDER — FAMOTIDINE 20 MG PO TABS
20.0000 mg | ORAL_TABLET | Freq: Two times a day (BID) | ORAL | Status: DC
Start: 2013-09-23 — End: 2013-09-24
  Administered 2013-09-23 – 2013-09-24 (×2): 20 mg via ORAL
  Filled 2013-09-23 (×3): qty 1

## 2013-09-23 MED ORDER — ONDANSETRON HCL 4 MG/2ML IJ SOLN
4.0000 mg | Freq: Four times a day (QID) | INTRAMUSCULAR | Status: DC | PRN
Start: 2013-09-23 — End: 2013-09-24

## 2013-09-23 MED ORDER — INSULIN ASPART 100 UNIT/ML SC SOPN
1.0000 [IU] | PEN_INJECTOR | Freq: Every evening | SUBCUTANEOUS | Status: DC | PRN
Start: 2013-09-23 — End: 2013-09-24
  Administered 2013-09-23: 3 [IU] via SUBCUTANEOUS
  Filled 2013-09-23: qty 3

## 2013-09-23 MED ORDER — ACETAMINOPHEN 325 MG PO TABS
650.0000 mg | ORAL_TABLET | ORAL | Status: DC | PRN
Start: 2013-09-23 — End: 2013-09-24

## 2013-09-23 MED ORDER — ISOSORBIDE MONONITRATE ER 30 MG PO TB24
30.0000 mg | ORAL_TABLET | Freq: Every day | ORAL | Status: DC
Start: 2013-09-24 — End: 2013-09-24
  Administered 2013-09-24: 30 mg via ORAL
  Filled 2013-09-23 (×2): qty 1

## 2013-09-23 MED ORDER — GLIMEPIRIDE 2 MG PO TABS
8.0000 mg | ORAL_TABLET | Freq: Every morning | ORAL | Status: DC
Start: 2013-09-24 — End: 2013-09-24
  Administered 2013-09-24: 8 mg via ORAL
  Filled 2013-09-23: qty 4

## 2013-09-23 MED ORDER — SODIUM CHLORIDE 0.9 % IJ SOLN
0.4000 mg | INTRAMUSCULAR | Status: DC | PRN
Start: 2013-09-23 — End: 2013-09-24

## 2013-09-23 MED ORDER — METOPROLOL TARTRATE 50 MG PO TABS
50.0000 mg | ORAL_TABLET | Freq: Two times a day (BID) | ORAL | Status: DC
Start: 2013-09-23 — End: 2013-09-24
  Administered 2013-09-23 – 2013-09-24 (×2): 50 mg via ORAL
  Filled 2013-09-23 (×3): qty 1

## 2013-09-23 MED ORDER — GLUCAGON 1 MG IJ SOLR (WRAP)
1.0000 mg | INTRAMUSCULAR | Status: DC | PRN
Start: 2013-09-23 — End: 2013-09-24
  Filled 2013-09-23: qty 1

## 2013-09-23 MED ORDER — HEPARIN SODIUM (PORCINE) PF 5000 UNIT/0.5ML IJ SOLN
5000.0000 [IU] | Freq: Three times a day (TID) | INTRAMUSCULAR | Status: DC
Start: 2013-09-23 — End: 2013-09-24
  Administered 2013-09-23 – 2013-09-24 (×3): 5000 [IU] via SUBCUTANEOUS
  Filled 2013-09-23 (×5): qty 0.5

## 2013-09-23 MED ORDER — INSULIN ASPART 100 UNIT/ML SC SOPN
1.0000 [IU] | PEN_INJECTOR | Freq: Three times a day (TID) | SUBCUTANEOUS | Status: DC | PRN
Start: 2013-09-23 — End: 2013-09-24
  Administered 2013-09-24: 7 [IU] via SUBCUTANEOUS

## 2013-09-23 MED ORDER — NITROGLYCERIN 0.4 MG SL SUBL
0.4000 mg | SUBLINGUAL_TABLET | SUBLINGUAL | Status: DC | PRN
Start: 2013-09-23 — End: 2013-09-24

## 2013-09-23 MED ORDER — DEXTROSE 50 % IV SOLN
25.0000 mL | INTRAVENOUS | Status: DC | PRN
Start: 2013-09-23 — End: 2013-09-24

## 2013-09-23 MED ORDER — ZOLPIDEM TARTRATE 5 MG PO TABS
5.0000 mg | ORAL_TABLET | Freq: Every evening | ORAL | Status: DC | PRN
Start: 2013-09-23 — End: 2013-09-24

## 2013-09-23 MED ORDER — TRAMADOL HCL 50 MG PO TABS
50.0000 mg | ORAL_TABLET | ORAL | Status: DC | PRN
Start: 2013-09-23 — End: 2013-09-24
  Administered 2013-09-23: 50 mg via ORAL
  Filled 2013-09-23: qty 1

## 2013-09-23 MED ORDER — ASPIRIN 81 MG PO CHEW
81.0000 mg | CHEWABLE_TABLET | Freq: Every day | ORAL | Status: DC
Start: 2013-09-23 — End: 2013-09-24
  Administered 2013-09-24: 81 mg via ORAL
  Filled 2013-09-23 (×2): qty 1

## 2013-09-23 MED ORDER — HYDROCHLOROTHIAZIDE 12.5 MG PO TABS
12.5000 mg | ORAL_TABLET | Freq: Every day | ORAL | Status: DC
Start: 2013-09-23 — End: 2013-09-24
  Administered 2013-09-24: 12.5 mg via ORAL
  Filled 2013-09-23 (×2): qty 1

## 2013-09-23 NOTE — Consults (Signed)
[facility]  Consultation Note    Date Time: 09/23/2013 6:11 PM  Patient Name: Michael [name], Michael [name]  MRN#: [id number]  DOB: 1955/07/01  Consulting Physician: Erasmo Leventhal, MD    Reason for Consult:   Chest pain     History:   Michael [name] is a 58 y.o. male with a pmh below. He was transferred from [facility]. Experiencing chest tightness that he rates a 7 out of 10. States SL NTG at [facility] did ease it.  Admits the sxs are different from prior to his stenting procedures. Takes Zantac for his hiatal hernia. Currently CP free.    Past Medical History:     Past Medical History   Diagnosis Date   . Gastroesophageal reflux disease    . Diabetes mellitus    . Hyperlipidemia    . Hypertension    . TIA (transient ischemic attack)    . CAD (coronary artery disease)    . Myocardial infarction    . Hernia    . Presence of stent in coronary artery    . Neuropathy      diabetic       Past Surgical History:     Past Surgical History   Procedure Laterality Date   . Coronary angioplasty with stent placement       x2    . Neck surgery     . Back surgery     . Coronary angioplasty with stent placement  12/13/12     DES to OM1       Problem List:   Active Problems:    Chest pain      Allergies:     Allergies   Allergen Reactions   . Gabapentin    . Glucophage [Metformin Hydrochloride]    . Keflex [Cephalexin]    . Morphine        Medications:       Prior to Admission medications    Medication Sig Start Date End Date Taking? Authorizing Provider   aspirin 81 MG chewable tablet Chew 1 tablet (81 mg total) by mouth daily. 03/19/13  Yes Brayton Caves, MD   atorvastatin (LIPITOR) 10 MG tablet Take 4 tablets (40 mg total) by mouth nightly.  Patient taking differently: Take 40 mg by mouth daily.    12/14/12  Yes Jhawar, Evlyn Clines, MD   fenofibrate (TRICOR) 48 MG tablet Take 2 tablets (96 mg total) by mouth daily. 12/14/12  Yes Jhawar, Manish, MD   glimepiride (AMARYL) 4 MG tablet Take 8 mg by mouth every  morning before breakfast.   Yes [provider]   glipiZIDE (GLUCOTROL) 10 MG tablet Take 10 mg by mouth 2 (two) times daily before meals.   Yes [provider]   hydrochlorothiazide (HYDRODIURIL) 12.5 MG tablet Take 12.5 mg by mouth daily.   Yes [provider]   hydrOXYzine (VISTARIL) 50 MG capsule Take 1 capsule (50 mg total) by mouth nightly. 03/19/13  Yes Brayton Caves, MD   insulin aspart (NOVOLOG) 100 UNIT/ML injection pen Inject 1-5 Units into the skin 4 times daily - with meals and at bedtime. 03/19/13  Yes Brayton Caves, MD   insulin detemir (LEVEMIR) 100 UNIT/ML injection Inject 60 Units into the skin nightly.   Yes [provider]   insulin glargine (LANTUS SOLOSTAR) 100 UNIT/ML injection pen Inject 10 Units into the skin nightly. 03/19/13  Yes Brayton Caves, MD   isosorbide mononitrate (IMDUR) 30 MG 24  hr tablet Take 30 mg by mouth daily.  02/28/13  Yes [provider]   METOPROLOL TARTRATE PO Take 100 mg by mouth 2 (two) times daily.   Yes [provider]   nitroglycerin (NITROSTAT) 0.4 MG SL tablet Place 0.4 mg under the tongue every 5 (five) minutes as needed.   Yes [provider]   nitroglycerin (NITROSTAT) 0.4 MG SL tablet Place 1 tablet (0.4 mg total) under the tongue every 5 (five) minutes as needed for Chest pain. 03/19/13  Yes Brayton Caves, MD   prasugrel (EFFIENT) 5 MG Tab Take 2 tablets (10 mg total) by mouth daily. 12/14/12  Yes Jhawar, Evlyn Clines, MD   ranitidine (ZANTAC) 300 MG tablet Take 300 mg by mouth daily.   Yes [provider]   sitaGLIPtin (JANUVIA) 100 MG tablet Take 100 mg by mouth daily.   Yes [provider]   amitriptyline (ELAVIL) 100 MG tablet Take 100 mg by mouth nightly.    [provider]   cyclobenzaprine (FLEXERIL) 10 MG tablet Take 10 mg by mouth every 8 (eight) hours as needed.    [provider]   TraMADol HCl 50 MG Tablet Dispersible Take 1-2 tablets by mouth every 8 (eight)  hours as needed. 50 mg    [provider]       Family History:     Family History   Problem Relation Age of Onset   . Heart disease Mother    . Stroke Mother    . Diabetes Mother    . Heart attack Mother    . Heart disease Father    . Stroke Father    . Heart attack Father    . Diabetes Sister        Social History:     History     Social History   . Marital Status: Single     Spouse Name: N/A     Number of Children: N/A   . Years of Education: N/A     Occupational History   . Not on file.     Social History Main Topics   . Smoking status: Never Smoker    . Smokeless tobacco: Never Used   . Alcohol Use: No   . Drug Use: No   . Sexual Activity: Not on file     Other Topics Concern   . Not on file     Social History Narrative       Review of Systems:     All systems reviewed and negative other than those noted in the HPI.    Physical Exam:     Filed Vitals:    09/23/13 1514   BP: 131/75   Temp: 97.9 F (36.6 C)   Resp: 19   SpO2: 97%       Intake and Output Summary (Last 24 hours) at Date Time  No intake or output data in the 24 hours ending 09/23/13 1811    General appearance - alert, well appearing, and in no distress  Head - Normocephalic, atraumatic  Throat - Moist mucous membranes  Eyes - pupils equal and reactive, extraocular eye movements intact  Neck - supple, no significant adenopathy, no thyromegaly, no masses, no JVD, no carotid bruit  Chest - clear to auscultation, no wheezes, rales or rhonchi  Heart - normal rate, regular rhythm, normal S1, S2, no murmurs, rubs, clicks or gallops  Abdomen - soft, epigastric tenderness, nondistended, no masses or organomegaly, bowel sounds heard  in all four quadrants  Neurological - alert, awake, oriented, non focal otherwise  Extremities - peripheral pulses normal, no pedal edema, no clubbing or cyanosis  Skin - normal coloration and turgor, no rashes, no suspicious skin lesions noted    Labs Reviewed:   Recent CMP No results for input(s): GLU, BUN, CREAT, NA,  K, MG, CO2, CA, AST, ALT, BILITOTAL, GLOB in the last 168 hours.    Invalid input(s): TP, ALP, AG    Recent CARDIAC ENZYMES No results for input(s): TROPI, CK, CKMB, CKMBINDEX, BNP in the last 168 hours.    Recent TSH No results for input(s): TSH in the last 168 hours.    Recent PT/PTT No results for input(s): PT, INR, APTT in the last 168 hours.    Recent CBC WITH DIFF No results for input(s): WBC, RBC, HGB, HCT, MCV, MPV, PLT in the last 168 hours.    Recent LIPID PANEL   Cholesterol (mg/dL)   Date Value   96/05/9526 98    12/13/2012 126    10/20/2011 155     Triglycerides (mg/dL)   Date Value   41/06/2438 154*   12/13/2012 678*   10/20/2011 286*    HDL (mg/dL)   Date Value   01/18/7252 25*   12/13/2012 24*   10/20/2011 32*    LDL Calculated (mg/dL)   Date Value   66/07/4032 42    12/13/2012 Unable to calculate due to triglycerides >400 mg/dl    10/19/2593 66         Rads:     Radiology Results (24 Hour)    ** No results found for the last 24 hours. **          Cardiovascular Workup:     ABI 10/16/12  1. Normal bilateral ABIs at rest and post exercise.   2. Limited PVRs at ankles and toe pressures are normal.     Echo 10/16/12  1. The left ventricle is thickened in a fashion consistent with mild concentric hypertrophy. Global systolic function was normal. The left ventricular ejection fraction is estimated to be 55-60%.   2. Diffuse thickening (sclerosis) without reduced excursion in the aortic valve. There is evidence of trivial (trace) aortic regurgitation.   3. There is evidence of trivial (trace) mitral regurgitation. There is evidence of sclerosis of the mitral valve.   4. There is evidence of trace (trivial) tricuspid regurgitation. The estimated right ventricular systolic pressure is 18 mmHg.   5. There is no evidence of pericardial effusion.     Nuclear stress 12/03/12  1. LV systolic function appears moderately reduced   2. Calculated LVEF 38%   3. Gated images show moderate global hypokinesis.   4. Perfusion images show a  large area of ischemia from base to distal in the infero-lateral wall with atleast moderate   reversibility.     MI s/p stenting in 2000 at [facility] in [facility]. stenting in 2010, data unavailable.     LHC 10/21/11   prior RCA stent which remains patent with minimal luminal encroachment. 3-vessel atherosclerosis without significant obstructive disease.     LHC 12/13/12   99% proximal OM2 stenosis s/p percutaneous balloon angioplasty followed by drug-eluting stent placement, Xience 3.25 x 23 mm     LHC 03/19/13  1. Patent stents in the RCA and circumflex marginal.  2. Significant 1st diagonal branch disease.  3. Normal LV systolic and diastolic function.      Assessment:   Michael [name] is  a 58 year old male who presents to [facility] for     1. Chest pain with partial relief from NTG. H/o of CAD. LHC in 03/2013 with patent stents in the RCA and circumflex marginal; I reviewed the angiogram again and just a small diagonal that was stent jailed that has ostial dx.  2. H/o of a TIA    Recommendations:     1. R/o protocol but suspect non-cardiac CP  2. Continue BB, CCB, Nitrates and Statin therapy      Thank you for consulting [facility] and allowing [place] to participate in this pt's care! We will follow along while the pt is in the hospital.    Signed by: Brayton Caves, MD

## 2013-09-23 NOTE — Progress Notes (Signed)
Dr Ahmed Prima notified of admission and of unrelieved chest tightness

## 2013-09-23 NOTE — H&P (Signed)
PRIMARY CARE PHYSICIAN: Richard B. Lissa Hoard, D.O.    CHIEF COMPLAINT: The patient came in as a transfer from [facility] for chest pain, nausea, and generalized  weakness along with dizziness.    HISTORY OF PRESENT ILLNESS: A 58 year old male with past medical  history of diabetes mellitus, hypertension, hyperlipidemia,  coronary artery disease, peripheral diabetic neuropathy, GERD,  depression, TIA; presented to [facility] stating  that since yesterday he is feeling generalized fatigue with poor  p.o. intake.  He also has off and on dizziness with some nausea.  He has episodes where he felt like he could not get his breath,  discontinued.  This morning, he went to work and continued to  feel tired with some nausea and had one episode of diaphoresis.  He checked his blood pressure, it was within normal limit at  that time but he was found but he found his pulse to be rapid.  He also complained at that time of some chest pressure, 4/10 in  severity, with some radiation to left arm and jaw and it got got  better with nitroglycerin.  Given the patient's recent cardiac  cath and stenting, the patient was transferred to [facility] with Cardiology evaluation.  At the time of  arrival, the patient continued to have chest pressure and was  given nitroglycerin again with some improvement.  No at the time  of my evaluation, patient was chest pain free.  The patient  denied any sore throat, trouble breathing, fever, vomiting,  diarrhea, or abdominal pain.  No report of any bilateral lower  extremity calf pain, swelling, or trauma.  No report of any  dysuria or urinary frequency.  The patient reported 100%  compliance with medications.    ALLERGIES: The patient reported allergy to GABAPENTIN,  GLUCOPHAGE, KEFLEX, and MORPHINE.    PAST MEDICAL HISTORY: Hypertension, hyperlipidemia, coronary  artery disease, TIA, anxiety, depression, diabetic peripheral  neuropathy along with diabetes  mellitus, and GERD.    PAST SURGICAL HISTORY: History of multiple cardiac  catheterization and cardiac stents, last catheterization last  year with patent stents and recommendation was to continue  medical therapy, history of neck surgery and back surgery.    FAMILY HISTORY: Significant for diabetes mellitus, heart  disease, hypertension in mother and diabetes mellitus heart  disease in father.    PERSONAL HISTORY: The patient denies any illicit drug abuse,  heavy alcohol drinking, or any cigarette smoking.  The patient  is fully functional at baseline.  The patient wants his niece's  to make decision for him if he cannot.  The patient wants to be  FULL CODE.    REVIEW OF SYSTEMS: Ten complete review of systems, including  eyes, ENT, cardiovascular, respiratory, gastrointestinal,  genitourinary, musculoskeletal, neurological, allergic,  immunological, rheumatological, or psychiatric are reviewed and  found unremarkable except pertinent positives as mentioned in  history of present illness and past medical history.    HOME MEDICATIONS: As per admission medication reconciliation  record, the patient is taking:    1.  Aspirin.  2.  Lipitor.  3.  Glimepiride 8 mg.  4.  Hydrochlorothiazide.  5.  Vistaril.  6.  NovoLog.  7.  Lantus.  8.  Imdur.  9.  Metoprolol.  10.  Nitroglycerin.  11.  Plavix.  12.  Zantac.  13.  Januvia.  14.  Flexeril.  15.  Tramadol.    PHYSICAL EXAMINATION: VITAL SIGNS:  Temperature afebrile with  36.6 degrees  centigrade, heart rate 97 per minute and regular,  respiratory rate 19 per minute, and blood pressure 131/75.  Oxygen saturation 100% on room air.  GENERAL APPEARANCE:  Moderately built male, in no apparent acute  respiratory distress.  HEENT:  Head is atraumatic, normocephalic.  Pupils are equal  clear and accommodation.  The patient has pink conjunctivae and  anicteric sclerae.  Oral mucosa moist with no pharyngeal  congestion.  No abnormal lesions or bleeding from nose.  External auditory  canal intact without any drainage.  Extraocular muscles intact.  NECK:  Supple.  Trachea central.  No thyromegaly.  No JVD or  carotid bruits.  CVS:  S1, S2 normal.  Regular rate and rhythm.   No murmur or  pericardial friction rub.  RESPIRATORY:  Clear to auscultation bilaterally.  No wheezing or  stridor.  ABDOMEN. Soft with mild tenderness in the left and right lower  quadrant.  No rebound tenderness.  Bowel sounds are audible.  MUSCULOSKELETAL:  The patient moving all four extremities well.  No pitting edema, clubbing, or cyanosis.  NEUROLOGICAL:  Cranial nerves 2 to 12 intact.  Grossly nonfocal.  PSYCHIATRIC:  Alert, awake, oriented x3.  Mood is appropriate.  INTEGUMENTARY:  Warm and moist with normal skin turgor.  No  rash.  LYMPHATIC:  No lymphadenopathy appreciated in axillary,  cervical, or inguinal area.    LABORATORY DATA: Lab work done at [facility]  reviewed by me showed unremarkable CBC and BMP except  hyponatremia.  The patient's 1st set of troponin showed 0.01.  The patient creatinine was within normal limit 0.69.  CAT scan  of abdomen and pelvis done there did not show any acute  inflammatory intra-abdominal process.  EKG over there showed  sinus tachycardia with nonspecific ST-T wave changes.    ASSESSMENT AND PLAN:  1.  Dizziness and nausea with sinus tachycardia and chest      pressure.  The patient do have multiple risk factors      including hypertension, hyperlipidemia, diabetes mellitus,      and coronary artery disease, although seems less likely.  I      will go ahead and admit the patient under observation and      monitor patient on telemetry for any significant cardiac      arrhythmias.  I will increase the patient's metoprolol to 50      mg two times a day.  Monitor systolic blood pressure.  Rule      out acute coronary syndrome with two more sets of cardiac      enzymes.  I will discuss the patient with Dr. Drue Second, who      graciously accepted the consult to further  evaluate the      patient for any possible intervention.  I have not order any      stress test for now.  I will also start the patient on Pepcid      as patient might have a component of gastritis making him      nauseous.  Followup vitamin B12 and folate levels.  2.  History diabetes mellitus, uncontrolled.  Continue      with glimepiride 8 mg along with Januvia.  I will start the      patient on metered dose insulin sliding scale.  Continue the      Lantus.  Follow fingersticks.  3.  History hypertension and hyperlipidemia, stable,      continue with the atorvastatin, metoprolol,  hydrochlorothiazide, and Imdur.  4.  History of coronary artery disease, status post      stents, continue with aspirin, Plavix, statin, and      metoprolol.  Continue on telemetry.  5.  Code status, FULL CODE.  6.  Deep venous thrombosis prophylaxis, heparin subcu      three times a day.    I have discussed assessment with the patient.  The patient  verbalized in agreement.        I hereby certify this patient for hospitalization based upon   medical necessity as noted above.    [id number]  DD: 09/23/2013 17:43:17  DT: 09/23/2013 19:45:09  JOB: [id number]

## 2013-09-23 NOTE — Progress Notes (Signed)
Pt received from [facility].  Experiencing chest tightness that he rates a 7 out of 10. States he has never had complete relief of this pressure though SL NTG at [place] did ease it.  NTG sl given per his request with only partial relief, now rates a 4 out of 10.

## 2013-09-23 NOTE — Plan of Care (Signed)
Problem: Safety  Goal: Patient will be free from injury during hospitalization  Outcome: Progressing  Pt placed on fall precautions, yellow arm band, slippers and door sign placed.  Pt made aware of need to call for assistance when up.

## 2013-09-24 LAB — VH CARDIAC PROF.WITH TROPONIN
Creatine Kinase (CK): 79 U/L (ref 30–230)
Creatinine Kinase MB (CKMB): 2.2 ng/mL (ref 0.1–6.0)
Troponin I: <0.01 ng/mL (ref 0.00–0.02)

## 2013-09-24 LAB — CBC AND DIFFERENTIAL
Basophils %: 0.7 % (ref 0.0–3.0)
Basophils Absolute: 0 10*3/uL (ref 0.0–0.3)
Eosinophils %: 4.1 % (ref 0.0–7.0)
Eosinophils Absolute: 0.2 10*3/uL (ref 0.0–0.8)
Hematocrit: 39.2 % (ref 39.0–52.5)
Hemoglobin: 14.2 gm/dL (ref 13.0–17.5)
Lymphocytes Absolute: 2.7 10*3/uL (ref 0.6–5.1)
Lymphocytes: 46.5 % — ABNORMAL HIGH (ref 15.0–46.0)
MCH: 30 pg (ref 28–35)
MCHC: 36 gm/dL (ref 32–36)
MCV: 82 fL (ref 80–100)
MPV: 7.2 fL (ref 6.0–10.0)
Monocytes Absolute: 0.4 10*3/uL (ref 0.1–1.7)
Monocytes: 7.9 % (ref 3.0–15.0)
Neutrophils %: 40.8 % — ABNORMAL LOW (ref 42.0–78.0)
Neutrophils Absolute: 2.3 10*3/uL (ref 1.7–8.6)
PLT CT: 186 10*3/uL (ref 130–440)
RBC: 4.76 10*6/uL (ref 4.00–5.70)
RDW: 12 % (ref 11.0–14.0)
WBC: 5.7 10*3/uL (ref 4.0–11.0)

## 2013-09-24 LAB — BASIC METABOLIC PANEL
Anion Gap: 14 mMol/L (ref 7.0–18.0)
BUN / Creatinine Ratio: 16.5 Ratio (ref 10.0–30.0)
BUN: 13 mg/dL (ref 7–22)
CO2: 25.8 mMol/L (ref 20.0–30.0)
Calcium: 9.5 mg/dL (ref 8.5–10.5)
Chloride: 101 mMol/L (ref 98–110)
Creatinine: 0.79 mg/dL — ABNORMAL LOW (ref 0.80–1.30)
EGFR: >60 mL/min/{1.73_m2}
Glucose: 260 mg/dL — ABNORMAL HIGH (ref 70–99)
Osmolality Calc: 283 mOsm/kg (ref 275–300)
Potassium: 3.8 mMol/L (ref 3.5–5.3)
Sodium: 137 mMol/L (ref 136–147)

## 2013-09-24 LAB — VH DEXTROSE STICK GLUCOSE
Glucose POCT: 320 mg/dL — ABNORMAL HIGH (ref 70–99)
Glucose POCT: 303 mg/dL — ABNORMAL HIGH (ref 70–99)
Glucose POCT: 301 mg/dL — ABNORMAL HIGH (ref 70–99)

## 2013-09-24 MED ORDER — NITROGLYCERIN 0.4 MG SL SUBL
SUBLINGUAL_TABLET | SUBLINGUAL | Status: AC
Start: 2013-09-24 — End: 2013-09-24
  Administered 2013-09-24: 0.4 mg via SUBLINGUAL
  Filled 2013-09-24: qty 25

## 2013-09-24 MED ORDER — ALPRAZOLAM 0.25 MG PO TABS
0.2500 mg | ORAL_TABLET | Freq: Three times a day (TID) | ORAL | Status: DC | PRN
Start: 2013-09-24 — End: 2013-09-24
  Administered 2013-09-24: 0.25 mg via ORAL
  Filled 2013-09-24: qty 1

## 2013-09-24 MED ORDER — VH NITROGLYCERIN IN D5W 200-5 MCG/ML-% IV SOLN (RAD NARRATOR)
INTRAVENOUS | Status: DC
Start: 2013-09-24 — End: 2013-09-24
  Filled 2013-09-24: qty 250

## 2013-09-24 NOTE — Discharge Instructions (Signed)
Patient Mr. Michael [name] was admitted to [facility] from 09/23/2013  To  09/24/2013. He can return to work on 09/26/2013.            Diet: Consistant Carbohydrate 1800 calories/day and Heart Healthy      Activities:  As tolerated      Notify Physician:  Shortness of breath, Swelling, Fever, Chest pain, Unrelieved pain and Bleeding             Chest Pain, Uncertain Cause  Chest pain can happen for a number of reasons. Sometimes the cause can not be determined. If yourcondition does not seem serious, and your pain does not appear to be coming from your heart, your doctor may recommend watching it closely. Sometimes the signs of a serious problem take more time to appear. Therefore, watch for the warning signs listed below.  Home care  After your visit, follow these recommendations:   Rest today and avoid strenuous activity.   Take any prescribed medicine as directed.  Follow-up care  Follow up with your doctor or this facility as instructed or if you do not start to feel better within 24 hours.  Call 911  Get immediate medical attention if any of the following occur:   A change in the type of pain: if it feels different, becomes more severe, lasts longer, or begins to spread into your shoulder, arm, neck, jaw or back   Shortness of breath or increased pain with breathing   Weakness, dizziness, or fainting   Rapid heart beat  Get prompt medical ttention  Call your doctor right away if any of the following occur:   Cough with dark colored sputum (phlegm) or blood   Fever of 100.41F(38C) or higher, or as directed by your health care provider   Swelling, pain or redness in one leg   [address], [address] [place], [place] [address]. All rights reserved. This information is not intended as a substitute for professional medical care. Always follow your healthcare professional's instructions.

## 2013-09-24 NOTE — Progress Notes (Signed)
Pt c/o chest pressure and dizziness .nitro 1 sl  Given. blood pressure  96/67.resting at present

## 2013-09-24 NOTE — UM Notes (Signed)
[facility] Utilization Management Review Sheet    NAME: Michael [name]  MR#: [id number]    CSN#: [id number]    ROOM: 364/364-A AGE: 58 y.o.    ADMIT DATE AND TIME: 09/23/2013  3:03 PM MD Order 09/23/2013 @1742       PATIENT CLASS:observation    ATTENDING PHYSICIAN: Erasmo Leventhal, MD  PAYOR:Payor: OUT OF STATE BLUE CROSS / Plan: BCBS BLUE CARD OUT OF STATE / Product Type: *No Product type* /       AUTH #:     DIAGNOSIS:Active Problems:    Chest pain    HISTORY:   Past Medical History   Diagnosis Date   . Gastroesophageal reflux disease    . Diabetes mellitus    . Hyperlipidemia    . Hypertension    . TIA (transient ischemic attack)    . CAD (coronary artery disease)    . Myocardial infarction    . Hernia    . Presence of stent in coronary artery    . Neuropathy      diabetic       DATE OF REVIEW: 09/24/2013    VITALS: BP 96/67   Pulse 101   Temp(Src) 97.9 F (36.6 C) (Oral)   Resp 18   Ht 1.778 m (5\' 10" )   Wt 87.68 kg (193 lb 4.8 oz)   BMI 27.74 kg/m2     SpO2 93%     ED Treatment  Transferred from [facility]  Presentation:    Labs:    Meds:    Medical Admission Review  Medical   H&P:  transfer from [facility] for chest pain, nausea, and generalized weakness along with dizziness.presented to [facility] stating that since yesterday he is feeling generalized fatigue with poor p.o. intake. He also has off and on dizziness with some nausea.He has episodes where he felt like he could not get his breath, Went to work this am feeling tired w/nausea had episode of diaphoresis BP check was normal but pulse rapid c/o chest pain w/radiation to left arm and jaw better after NTG, recent cath and stent, transferred to [facility] with Chest pain on arrival, again NTG with improvement of pain 36.6 degrees centigrade, heart rate 97 per minute and regular,  respiratory rate 19 per minute, and blood pressure 131/75. Oxygen saturation 100%   Labs:from OSH   Trop 0.01, Creat 0.69, CT ad/pelvis no acute, EKG  ST w/ nonspecific ST-T changes    Assessment/ Plan:  1. Dizziness and nausea with sinus tachycardia and chest pressure. multiple risk factors including hypertension, hyperlipidemia, diabetes mellitus, and coronary artery disease, although seems less likely. Iadmit the patient under observation monitor telemetry for any significant cardiac Arrhythmias. increase metoprolol to 50 mg two times a day. Monitor systolic blood pressure. Rule out acute coronary syndrome with two more sets of cardiac enzymes.   start on Pepcid  R/t ?able gastritis causing nausa. vitamin B12 and folate levels.  2. History diabetes mellitus, uncontrolled. Continue with glimepiride 8 mg along with Januvia. start on metered dose insulin sliding scale. Continue Lantus. Follow fingersticks.      CSR:  Cardiology 6/8  Assessment:  transferred from [facility]. Experiencing chest tightness that he rates a 7 out of 10. States SL NTG at [place] did ease it. Admits the sxs are different from prior to his stenting procedure  1. Chest pain with partial relief from NTG. H/o of CAD. LHC in 03/2013 with patent stents in the RCA  and circumflex marginal; I reviewed the angiogram again and just a small diagonal that was stent jailed that has ostial dx.  2. H/o of a TIA  Plan   1. R/o protocol but suspect non-cardiac CP  2. Continue BB, CCB, Nitrates and Statin therapy    6/9 @0955  per RN   C/o Chest Pain, NTG sl given BP 96/67    6/9  97.9, 101, 18, 96/67, 98%  Neutrophil 40.8, Lymphocytes 46.5, Glu 260-320, Creat 0.79, Cardiac enzymes negative x 1    [employer] [facility] [facility]  Phone [phone], Fax [phone]

## 2013-09-24 NOTE — Progress Notes (Signed)
Xanax relieved nervousness,pt resting at present.awaiting to see Dr

## 2013-09-24 NOTE — Discharge Summary (Signed)
DISCHARGE SUMMARY - [facility]    Patient Name: Michael [name]  Attending Physician: Erasmo Leventhal, MD  Primary Care Physician: Alonna Minium, DO    Date of Admission: 09/23/2013  Date of Discharge: 09/24/2013  Length of Stay in the Hospital: 1    Discharge Diagnoses:                                                                         Atypical chest pain   Hypertension  Coronary artery disease  Dyslipidemia  History of TIA          Discharge Medications:                                                                          [place]ir[place], [place]   Home Medication Instructions [id number]    Printed on:09/24/13 1753   Medication Information                      amitriptyline (ELAVIL) 100 MG tablet  Take 100 mg by mouth nightly.             aspirin 81 MG chewable tablet  Chew 1 tablet (81 mg total) by mouth daily.             atorvastatin (LIPITOR) 10 MG tablet  Take 4 tablets (40 mg total) by mouth nightly.             cyclobenzaprine (FLEXERIL) 10 MG tablet  Take 10 mg by mouth every 8 (eight) hours as needed.             fenofibrate (TRICOR) 48 MG tablet  Take 2 tablets (96 mg total) by mouth daily.             glimepiride (AMARYL) 4 MG tablet  Take 8 mg by mouth every morning before breakfast.             hydrochlorothiazide (HYDRODIURIL) 12.5 MG tablet  Take 12.5 mg by mouth daily.             hydrOXYzine (VISTARIL) 50 MG capsule  Take 1 capsule (50 mg total) by mouth nightly.             insulin aspart (NOVOLOG) 100 UNIT/ML injection pen  Inject 1-5 Units into the skin 4 times daily - with meals and at bedtime.             insulin glargine (LANTUS SOLOSTAR) 100 UNIT/ML injection pen  Inject 10 Units into the skin nightly.             isosorbide mononitrate (IMDUR) 30 MG 24 hr tablet  Take 30 mg by mouth daily.              METOPROLOL TARTRATE PO  Take 100 mg by mouth 2 (two) times daily.             nitroglycerin (NITROSTAT) 0.4 MG SL tablet  Place  0.4 mg under the tongue every 5  (five) minutes as needed.             nitroglycerin (NITROSTAT) 0.4 MG SL tablet  Place 1 tablet (0.4 mg total) under the tongue every 5 (five) minutes as needed for Chest pain.             prasugrel (EFFIENT) 5 MG Tab  Take 2 tablets (10 mg total) by mouth daily.             ranitidine (ZANTAC) 300 MG tablet  Take 300 mg by mouth daily.             sitaGLIPtin (JANUVIA) 100 MG tablet  Take 100 mg by mouth daily.             TraMADol HCl 50 MG Tablet Dispersible  Take 1-2 tablets by mouth every 8 (eight) hours as needed. 50 mg                 Consultations:                                                                                       Cardiology    Labs:                                                                                         Recent Labs  Lab 09/24/13  0533   WBC 5.7   RBC 4.76   Hemoglobin 14.2   Hematocrit 39.2   MCV 82   PLT CT 186         Recent Labs  Lab 09/24/13  0533   Sodium 137   Potassium 3.8   Chloride 101   CO2 25.8   BUN 13   Creatinine 0.79*   Glucose 260*   CALCIUM 9.5       No results for input(s): ALT, AST, GGT, BILITOTAL, BILIDIRECT, ALB, ALKPHOS in the last 168 hours.    No results for input(s): INR, PTT in the last 168 hours.    Imaging studies:                                                                                       Xr Hip Left Ap And Lateral    08/28/2013   Moderate hypertrophic spurring of the acetabulum. Otherwise normal examination left hip with no acute findings.  ReadingStation:[id number]    Xr Hip Right Ap And Lateral    08/28/2013  Hypertrophic spurring of the acetabular margins. Otherwise normal examination right hip. No acute findings.  ReadingStation:[id number]      Culture results:                                                                                         Microbiology Results    None          Hospital Course:                                                                                    For full details of the patient's admission  please consult the whole medical record including daily progress notes, history and physical, consult notes, lab reports as well as imaging studies.  Briefly Patient was admitted with chest pain and was ruled out for ACS with serial cardiac enzymes. Telemetry did not show any significant arrhythmias. CXR did not show any acute cardio-pulmonary process. Patient recently had a cardiac catheterization on 12 2014 which showed patent stent in the RCA is and circumflex marginal his cardiac catheterization was reviewed by the cardiologist he recommended no further workup and he cleared the patient for discharge home.    Discharge Day Exam:  Temp:  [97.8 F (36.6 C)-98.1 F (36.7 C)] 97.8 F (36.6 C)  Heart Rate:  [74-101] 92  Resp Rate:  [18-20] 20  BP: (96-119)/(59-79) 103/59 mmHg    General: awake, alert, oriented x 3; no acute distress.  Cardiovascular: regular rate and rhythm,S1 and S2 normal.  Respiratory: clear to auscultation bilaterally, no wheezing  Abdomen: soft, Non-tender, non-distended, audible bowel sounds  Extremities: no clubbing, cyanosis, or edema  Neuro: CN II-XII intact, no gross focal deficit    Discharge condition: stable    Discharge instructions:                                                                             Nutrition: Heart healthy diet    Activity: As tolerated     Follow up:  Follow-up Information    Follow up with Orest Dikes, MD In 2 weeks.    Specialties:  Interventional Cardiology, Internal Medicine, Cardiology    Contact information:    [address]  [place] [address]  [phone]          Follow up with Alonna Minium, DO .    Specialty:  Family Medicine    Contact information:    [address]  [address]  [place] [address]  [phone]  Time spent coordinating discharge and reviewing discharge plan: 35 minutes    Signed by:   Stann Ore  09/24/2013  5:53 PM    [facility]    CC: Leola Brazil B, DO

## 2013-09-24 NOTE — Progress Notes (Signed)
[facility]  Progress Note    Date Time: 09/24/2013 5:25 PM  Patient Name: Michael [name], Michael [name]    Subjective:   Denies chest pain, SOB, palpitations..    Physical Exam:     Filed Vitals:    09/24/13 1504   BP: 103/59   Pulse: 92   Temp: 97.8 F (36.6 C)   Resp: 20   SpO2: 99%     Temp (24hrs), Avg:97.9 F (36.6 C), Min:97.8 F (36.6 C), Max:98.1 F (36.7 C)      Intake/Output Summary (Last 24 hours) at 09/24/13 1725  Last data filed at 09/23/13 2344   Gross per 24 hour   Intake      0 ml   Output    700 ml   Net   -700 ml     General appearance - alert, well appearing, and in no distress  Head - Normocephalic, atraumatic  Throat - Moist mucous membranes  Eyes - pupils equal and reactive, extraocular eye movements intact  Neck - supple, no significant adenopathy, no thyromegaly, no masses, no JVD, no carotid bruit  Chest - clear to auscultation, no wheezes, rales or rhonchi  Heart - normal rate, regular rhythm, normal S1, S2, no murmurs, rubs, clicks or gallops  Abdomen - soft, epigastric tenderness, nondistended, no masses or organomegaly, bowel sounds heard in all four quadrants  Neurological - alert, awake, oriented, non focal otherwise  Extremities - peripheral pulses normal, no pedal edema, no clubbing or cyanosis  Skin - normal coloration and turgor, no rashes, no suspicious skin lesions noted    Medications:      Current Facility-Administered Medications   Medication Dose Route Frequency   . aspirin  81 mg Oral Daily   . atorvastatin  40 mg Oral QHS   . clopidogrel  75 mg Oral Daily   . famotidine  20 mg Oral Q12H SCH   . fenofibrate  54 mg Oral Daily   . glimepiride  8 mg Oral QAM AC   . heparin (porcine)  5,000 Units Subcutaneous Q8H   . hydrochlorothiazide  12.5 mg Oral Daily   . hydrOXYzine  50 mg Oral QHS   . insulin glargine  10 Units Subcutaneous QHS   . isosorbide mononitrate  30 mg Oral Daily at 0600   . metoprolol  50 mg Oral Q12H SCH   . nitroglycerin       .  sitaGLIPtin  100 mg Oral Daily     Prior to Admission medications    Medication Sig Start Date End Date Taking? Authorizing Provider   aspirin 81 MG chewable tablet Chew 1 tablet (81 mg total) by mouth daily. 03/19/13  Yes Brayton Caves, MD   atorvastatin (LIPITOR) 10 MG tablet Take 4 tablets (40 mg total) by mouth nightly.  Patient taking differently: Take 40 mg by mouth daily.    12/14/12  Yes Jhawar, Evlyn Clines, MD   fenofibrate (TRICOR) 48 MG tablet Take 2 tablets (96 mg total) by mouth daily. 12/14/12  Yes Jhawar, Manish, MD   glimepiride (AMARYL) 4 MG tablet Take 8 mg by mouth every morning before breakfast.   Yes [provider]   hydrochlorothiazide (HYDRODIURIL) 12.5 MG tablet Take 12.5 mg by mouth daily.   Yes [provider]   hydrOXYzine (VISTARIL) 50 MG capsule Take 1 capsule (50 mg total) by mouth nightly. 03/19/13  Yes Brayton Caves, MD   insulin aspart (NOVOLOG) 100 UNIT/ML injection pen Inject 1-5 Units  into the skin 4 times daily - with meals and at bedtime. 03/19/13  Yes Brayton Caves, MD   insulin glargine (LANTUS SOLOSTAR) 100 UNIT/ML injection pen Inject 10 Units into the skin nightly. 03/19/13  Yes Brayton Caves, MD   isosorbide mononitrate (IMDUR) 30 MG 24 hr tablet Take 30 mg by mouth daily.  02/28/13  Yes [provider]   METOPROLOL TARTRATE PO Take 100 mg by mouth 2 (two) times daily.   Yes [provider]   nitroglycerin (NITROSTAT) 0.4 MG SL tablet Place 0.4 mg under the tongue every 5 (five) minutes as needed.   Yes [provider]   nitroglycerin (NITROSTAT) 0.4 MG SL tablet Place 1 tablet (0.4 mg total) under the tongue every 5 (five) minutes as needed for Chest pain. 03/19/13  Yes Brayton Caves, MD   prasugrel (EFFIENT) 5 MG Tab Take 2 tablets (10 mg total) by mouth daily. 12/14/12  Yes Jhawar, Evlyn Clines, MD   ranitidine (ZANTAC) 300 MG tablet Take 300 mg by mouth daily.   Yes [provider]   sitaGLIPtin (JANUVIA) 100 MG tablet Take 100 mg by  mouth daily.   Yes [provider]   amitriptyline (ELAVIL) 100 MG tablet Take 100 mg by mouth nightly.    [provider]   cyclobenzaprine (FLEXERIL) 10 MG tablet Take 10 mg by mouth every 8 (eight) hours as needed.    [provider]   TraMADol HCl 50 MG Tablet Dispersible Take 1-2 tablets by mouth every 8 (eight) hours as needed. 50 mg    [provider]         Labs:   Recent CMP     Recent Labs  Lab 09/24/13  0533   Glucose 260*   BUN 13   Creatinine 0.79*   Sodium 137   Potassium 3.8   CO2 25.8   CALCIUM 9.5       Recent CARDIAC ENZYMES     Recent Labs  Lab 09/23/13  2131 09/23/13  1947   Troponin I <0.01 <0.01   Creatine Kinase (CK) 90 91       Recent TSH No results for input(s): TSH in the last 168 hours.    Recent PT/PTT No results for input(s): PT, INR, APTT in the last 168 hours.    Recent CBC WITH DIFF     Recent Labs  Lab 09/24/13  0533   WBC 5.7   RBC 4.76   Hemoglobin 14.2   Hematocrit 39.2   MCV 82   MPV 7.2   PLT CT 186       Recent LIPID PANEL  Cholesterol (mg/dL)   Date Value   82/12/5619 98    12/13/2012 126    10/20/2011 155     Triglycerides (mg/dL)   Date Value   30/11/6576 154*   12/13/2012 678*   10/20/2011 286*    HDL (mg/dL)   Date Value   46/12/6293 25*   12/13/2012 24*   10/20/2011 32*    LDL Calculated (mg/dL)   Date Value   28/07/1322 42    12/13/2012 Unable to calculate due to triglycerides >400 mg/dl    4/0/1027 66         Rads:     Radiology Results (24 Hour)    ** No results found for the last 24 hours. **          Cardiac Workup:     ABI 10/16/12  1. Normal bilateral ABIs at  rest and post exercise.   2. Limited PVRs at ankles and toe pressures are normal.     Echo 10/16/12  1. The left ventricle is thickened in a fashion consistent with mild concentric hypertrophy. Global systolic function was normal. The left ventricular ejection fraction is estimated to be 55-60%.   2. Diffuse thickening (sclerosis) without reduced excursion in the aortic valve. There is  evidence of trivial (trace) aortic regurgitation.   3. There is evidence of trivial (trace) mitral regurgitation. There is evidence of sclerosis of the mitral valve.   4. There is evidence of trace (trivial) tricuspid regurgitation. The estimated right ventricular systolic pressure is 18 mmHg.   5. There is no evidence of pericardial effusion.     Nuclear stress 12/03/12  1. LV systolic function appears moderately reduced   2. Calculated LVEF 38%   3. Gated images show moderate global hypokinesis.   4. Perfusion images show a large area of ischemia from base to distal in the infero-lateral wall with atleast moderate  reversibility.     MI s/p stenting in 2000 at [facility] in [facility]. stenting in 2010, data unavailable.     LHC 10/21/11   prior RCA stent which remains patent with minimal luminal encroachment. 3-vessel atherosclerosis without significant obstructive disease.     LHC 12/13/12   99% proximal OM2 stenosis s/p percutaneous balloon angioplasty followed by drug-eluting stent placement, Xience 3.25 x 23 mm     LHC 03/19/13  1. Patent stents in the RCA and circumflex marginal.  2. Significant 1st diagonal branch disease.  3. Normal LV systolic and diastolic function.    Problem List:   Active Problems:    Chest pain       Assessment:   Bohdan Macho is a 58 year old male who presents to [facility] for     1. Chest pain with partial relief from NTG. H/o of CAD. LHC in 03/2013 with patent stents in the RCA and circumflex marginal; I reviewed the angiogram again and just a small diagonal that was stent jailed that has ostial dx.  2. H/o of a TIA    Plan:     1. R/o for MI, suspect non-cardiac CP  2. Continue BB, CCB, Nitrates and Statin therapy  3. F/u in the office in 2 wks    Brayton Caves, MD

## 2013-09-24 NOTE — Plan of Care (Signed)
Problem: Safety  Goal: Patient will be free from injury during hospitalization  Outcome: Progressing    Problem: Pain  Goal: Patient's pain/discomfort is manageable  Outcome: Progressing    Problem: Chest Pain  Goal: Vital signs and cardiac rhythm stable  Outcome: Progressing

## 2013-09-24 NOTE — Progress Notes (Signed)
[facility]   [address]   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 Cards consulted, per MD notes suspect pain is non-cardiac    CM Interventions: chart reviewed, no anticipated CM needs    Transportation: family to transport pt home    Barriers to discharge:  none    D/C Plan and Needs:  No CM needs identified, reconsult if needed.        Esther Hardy, RN, BSN  Case Manager  [facility] Inpatient  Ph [phone]  Fx [phone]

## 2014-02-04 ENCOUNTER — Other Ambulatory Visit (INDEPENDENT_AMBULATORY_CARE_PROVIDER_SITE_OTHER): Payer: Self-pay | Admitting: Family Medicine

## 2014-02-04 DIAGNOSIS — M79662 Pain in left lower leg: Secondary | ICD-10-CM

## 2014-02-04 DIAGNOSIS — M6281 Muscle weakness (generalized): Secondary | ICD-10-CM

## 2014-02-04 DIAGNOSIS — M5410 Radiculopathy, site unspecified: Secondary | ICD-10-CM

## 2014-02-08 ENCOUNTER — Other Ambulatory Visit (INDEPENDENT_AMBULATORY_CARE_PROVIDER_SITE_OTHER): Payer: Self-pay

## 2014-02-12 ENCOUNTER — Ambulatory Visit (INDEPENDENT_AMBULATORY_CARE_PROVIDER_SITE_OTHER)
Admission: RE | Admit: 2014-02-12 | Discharge: 2014-02-12 | Disposition: A | Discharge status: Home / Self Care | Payer: BC Managed Care – PPO | Source: Ambulatory Visit | Attending: Family Medicine | Admitting: Family Medicine

## 2014-02-12 DIAGNOSIS — M79662 Pain in left lower leg: Secondary | ICD-10-CM

## 2014-02-12 DIAGNOSIS — M5410 Radiculopathy, site unspecified: Secondary | ICD-10-CM

## 2014-02-12 DIAGNOSIS — M6281 Muscle weakness (generalized): Secondary | ICD-10-CM

## 2014-04-12 ENCOUNTER — Observation Stay: Payer: BC Managed Care – PPO

## 2014-04-12 ENCOUNTER — Observation Stay
Admission: RE | Admit: 2014-04-12 | Discharge: 2014-04-12 | Disposition: A | Discharge status: Home / Self Care | Payer: BC Managed Care – PPO | Source: Other Acute Inpatient Hospital | Attending: Family Medicine | Admitting: Family Medicine

## 2014-04-12 ENCOUNTER — Encounter: Payer: Self-pay | Admitting: Internal Medicine

## 2014-04-12 ENCOUNTER — Observation Stay: Payer: BC Managed Care – PPO | Admitting: Internal Medicine

## 2014-04-12 DIAGNOSIS — I252 Old myocardial infarction: Secondary | ICD-10-CM | POA: Insufficient documentation

## 2014-04-12 DIAGNOSIS — Z955 Presence of coronary angioplasty implant and graft: Secondary | ICD-10-CM | POA: Insufficient documentation

## 2014-04-12 DIAGNOSIS — E1165 Type 2 diabetes mellitus with hyperglycemia: Secondary | ICD-10-CM | POA: Insufficient documentation

## 2014-04-12 DIAGNOSIS — E785 Hyperlipidemia, unspecified: Secondary | ICD-10-CM | POA: Insufficient documentation

## 2014-04-12 DIAGNOSIS — Z7982 Long term (current) use of aspirin: Secondary | ICD-10-CM | POA: Insufficient documentation

## 2014-04-12 DIAGNOSIS — Z886 Allergy status to analgesic agent status: Secondary | ICD-10-CM | POA: Insufficient documentation

## 2014-04-12 DIAGNOSIS — Z794 Long term (current) use of insulin: Secondary | ICD-10-CM | POA: Insufficient documentation

## 2014-04-12 DIAGNOSIS — Z8673 Personal history of transient ischemic attack (TIA), and cerebral infarction without residual deficits: Secondary | ICD-10-CM | POA: Insufficient documentation

## 2014-04-12 DIAGNOSIS — I10 Essential (primary) hypertension: Secondary | ICD-10-CM | POA: Insufficient documentation

## 2014-04-12 DIAGNOSIS — Z881 Allergy status to other antibiotic agents status: Secondary | ICD-10-CM | POA: Insufficient documentation

## 2014-04-12 DIAGNOSIS — K219 Gastro-esophageal reflux disease without esophagitis: Secondary | ICD-10-CM

## 2014-04-12 DIAGNOSIS — R079 Chest pain, unspecified: Principal | ICD-10-CM | POA: Insufficient documentation

## 2014-04-12 DIAGNOSIS — E1140 Type 2 diabetes mellitus with diabetic neuropathy, unspecified: Secondary | ICD-10-CM | POA: Insufficient documentation

## 2014-04-12 DIAGNOSIS — I2510 Atherosclerotic heart disease of native coronary artery without angina pectoris: Secondary | ICD-10-CM | POA: Insufficient documentation

## 2014-04-12 LAB — CBC AND DIFFERENTIAL
Basophils %: 0.8 % (ref 0.0–3.0)
Basophils Absolute: 0.1 10*3/uL (ref 0.0–0.3)
Eosinophils %: 2.9 % (ref 0.0–7.0)
Eosinophils Absolute: 0.2 10*3/uL (ref 0.0–0.8)
Hematocrit: 39.5 % (ref 39.0–52.5)
Hemoglobin: 13.8 gm/dL (ref 13.0–17.5)
Lymphocytes Absolute: 2.2 10*3/uL (ref 0.6–5.1)
Lymphocytes: 34.6 % (ref 15.0–46.0)
MCH: 29 pg (ref 28–35)
MCHC: 35 gm/dL (ref 32–36)
MCV: 84 fL (ref 80–100)
MPV: 6.8 fL (ref 6.0–10.0)
Monocytes Absolute: 0.4 10*3/uL (ref 0.1–1.7)
Monocytes: 6.9 % (ref 3.0–15.0)
Neutrophils %: 54.8 % (ref 42.0–78.0)
Neutrophils Absolute: 3.5 10*3/uL (ref 1.7–8.6)
PLT CT: 189 10*3/uL (ref 130–440)
RBC: 4.72 10*6/uL (ref 4.00–5.70)
RDW: 11.9 % (ref 11.0–14.0)
WBC: 6.4 10*3/uL (ref 4.0–11.0)

## 2014-04-12 LAB — COMPREHENSIVE METABOLIC PANEL
ALT: 31 U/L (ref 0–55)
AST (SGOT): 16 U/L (ref 10–42)
Albumin/Globulin Ratio: 1.29 Ratio (ref 0.70–1.50)
Albumin: 3.6 gm/dL (ref 3.5–5.0)
Alkaline Phosphatase: 52 U/L (ref 40–145)
Anion Gap: 12.7 mMol/L (ref 7.0–18.0)
BUN / Creatinine Ratio: 20 Ratio (ref 10.0–30.0)
BUN: 16 mg/dL (ref 7–22)
Bilirubin, Total: 1.4 mg/dL — ABNORMAL HIGH (ref 0.1–1.2)
CO2: 24.3 mMol/L (ref 20.0–30.0)
Calcium: 8.9 mg/dL (ref 8.5–10.5)
Chloride: 103 mMol/L (ref 98–110)
Creatinine: 0.8 mg/dL (ref 0.80–1.30)
EGFR: >60 mL/min/{1.73_m2}
Globulin: 2.8 gm/dL (ref 2.0–4.0)
Glucose: 269 mg/dL — ABNORMAL HIGH (ref 70–99)
Osmolality Calc: 283 mOsm/kg (ref 275–300)
Potassium: 4 mMol/L (ref 3.5–5.3)
Protein, Total: 6.4 gm/dL (ref 6.0–8.3)
Sodium: 136 mMol/L (ref 136–147)

## 2014-04-12 LAB — ECG 12-LEAD
Interpretation Text: BORDERLINE
P Wave Axis: 58 deg
P Wave Duration: 122 ms
P-R Interval: 208 ms
Patient Age: 58 years
Q-T Dispersion: 62 ms
Q-T Interval(Corrected): 444 ms
Q-T Interval: 406 ms
QRS Axis: 24 deg
QRS Duration: 84 ms
T Axis: 69 deg
Ventricular Rate: 72 /min

## 2014-04-12 LAB — TROPONIN I
Troponin I: 0.01 ng/mL (ref 0.00–0.02)
Troponin I: 0.01 ng/mL (ref 0.00–0.02)

## 2014-04-12 LAB — VH DEXTROSE STICK GLUCOSE
Glucose POCT: 238 mg/dL — ABNORMAL HIGH (ref 70–99)
Glucose POCT: 270 mg/dL — ABNORMAL HIGH (ref 70–99)

## 2014-04-12 LAB — LIPID PANEL
Cholesterol: 149 mg/dL (ref 75–199)
Coronary Heart Disease Risk: 5.96
HDL: 25 mg/dL — ABNORMAL LOW (ref 40–55)
LDL Calculated: 47 mg/dL
Triglycerides: 383 mg/dL — ABNORMAL HIGH (ref 10–150)
VLDL: 77 — ABNORMAL HIGH (ref 0–40)

## 2014-04-12 LAB — TSH: TSH: 0.5 u[IU]/mL (ref 0.40–4.20)

## 2014-04-12 LAB — HEMOGLOBIN A1C: Hgb A1C, %: 11.9 %

## 2014-04-12 MED ORDER — AMITRIPTYLINE HCL 50 MG PO TABS
100.0000 mg | ORAL_TABLET | Freq: Every evening | ORAL | Status: DC
Start: 2014-04-12 — End: 2014-04-12
  Filled 2014-04-12: qty 2

## 2014-04-12 MED ORDER — HYDROCHLOROTHIAZIDE 12.5 MG PO TABS
12.5000 mg | ORAL_TABLET | Freq: Every day | ORAL | Status: DC
Start: 2014-04-12 — End: 2014-04-12
  Administered 2014-04-12: 12.5 mg via ORAL
  Filled 2014-04-12: qty 1

## 2014-04-12 MED ORDER — REGADENOSON 0.4 MG/5ML IV SOLN
0.40 mg | Freq: Once | INTRAVENOUS | Status: AC
Start: 2014-04-12 — End: 2014-04-12
  Administered 2014-04-12: 0.4 mg via INTRAVENOUS
  Filled 2014-04-12: qty 5

## 2014-04-12 MED ORDER — INSULIN ASPART 100 UNIT/ML SC SOPN
2.00 [IU] | PEN_INJECTOR | Freq: Three times a day (TID) | SUBCUTANEOUS | Status: DC | PRN
Start: 2014-04-12 — End: 2014-04-12
  Administered 2014-04-12: 7 [IU] via SUBCUTANEOUS

## 2014-04-12 MED ORDER — GLIMEPIRIDE 2 MG PO TABS
8.0000 mg | ORAL_TABLET | Freq: Every morning | ORAL | Status: DC
Start: 2014-04-12 — End: 2014-04-12
  Administered 2014-04-12: 8 mg via ORAL
  Filled 2014-04-12: qty 4

## 2014-04-12 MED ORDER — INSULIN GLARGINE 100 UNIT/ML SC SOPN
10.00 [IU] | PEN_INJECTOR | Freq: Every evening | SUBCUTANEOUS | Status: DC
Start: 2014-04-12 — End: 2014-04-12

## 2014-04-12 MED ORDER — REGADENOSON 0.4 MG/5ML IV SOLN
INTRAVENOUS | Status: AC
Start: 2014-04-12 — End: ?
  Filled 2014-04-12: qty 5

## 2014-04-12 MED ORDER — ASPIRIN 81 MG PO CHEW
81.0000 mg | CHEWABLE_TABLET | Freq: Every day | ORAL | Status: DC
Start: 2014-04-12 — End: 2014-04-12
  Administered 2014-04-12: 81 mg via ORAL
  Filled 2014-04-12: qty 1

## 2014-04-12 MED ORDER — TRAMADOL HCL 50 MG PO TABS
50.00 mg | ORAL_TABLET | Freq: Three times a day (TID) | ORAL | Status: DC | PRN
Start: 2014-04-12 — End: 2014-04-12

## 2014-04-12 MED ORDER — ONDANSETRON HCL 4 MG/2ML IJ SOLN
4.00 mg | Freq: Three times a day (TID) | INTRAMUSCULAR | Status: DC | PRN
Start: 2014-04-12 — End: 2014-04-12

## 2014-04-12 MED ORDER — ISOSORBIDE MONONITRATE ER 30 MG PO TB24
30.0000 mg | ORAL_TABLET | Freq: Every day | ORAL | Status: DC
Start: 2014-04-12 — End: 2014-04-12
  Filled 2014-04-12 (×2): qty 1

## 2014-04-12 MED ORDER — SITAGLIPTIN PHOSPHATE 100 MG PO TABS
100.0000 mg | ORAL_TABLET | Freq: Every day | ORAL | Status: DC
Start: 2014-04-12 — End: 2014-04-12
  Administered 2014-04-12: 100 mg via ORAL
  Filled 2014-04-12: qty 1

## 2014-04-12 MED ORDER — GLUCAGON 1 MG IJ SOLR (WRAP)
1.00 mg | INTRAMUSCULAR | Status: DC | PRN
Start: 2014-04-12 — End: 2014-04-12
  Filled 2014-04-12: qty 1

## 2014-04-12 MED ORDER — METOPROLOL TARTRATE 100 MG PO TABS
100.00 mg | ORAL_TABLET | Freq: Two times a day (BID) | ORAL | Status: DC
Start: 2014-04-12 — End: 2014-04-12
  Administered 2014-04-12: 100 mg via ORAL
  Filled 2014-04-12 (×2): qty 1

## 2014-04-12 MED ORDER — FENOFIBRATE 54 MG PO TABS
54.00 mg | ORAL_TABLET | Freq: Every day | ORAL | Status: DC
Start: 2014-04-12 — End: 2014-04-12
  Administered 2014-04-12: 54 mg via ORAL
  Filled 2014-04-12: qty 1

## 2014-04-12 MED ORDER — ATORVASTATIN CALCIUM 40 MG PO TABS
40.0000 mg | ORAL_TABLET | Freq: Every evening | ORAL | Status: DC
Start: 2014-04-12 — End: 2014-04-12
  Filled 2014-04-12: qty 1

## 2014-04-12 MED ORDER — PRASUGREL HCL 10 MG PO TABS
10.0000 mg | ORAL_TABLET | Freq: Every day | ORAL | Status: DC
Start: 2014-04-12 — End: 2014-04-12
  Administered 2014-04-12: 10 mg via ORAL
  Filled 2014-04-12: qty 1

## 2014-04-12 MED ORDER — NITROGLYCERIN 0.4 MG SL SUBL
0.40 mg | SUBLINGUAL_TABLET | SUBLINGUAL | Status: DC | PRN
Start: 2014-04-12 — End: 2014-04-12

## 2014-04-12 MED ORDER — TECHNETIUM TC 99M SESTAMIBI - CARDIOLITE
30.80 | Freq: Once | Status: AC | PRN
Start: 2014-04-12 — End: 2014-04-12
  Administered 2014-04-12: 30.8 via INTRAVENOUS

## 2014-04-12 MED ORDER — FAMOTIDINE 20 MG PO TABS
20.00 mg | ORAL_TABLET | Freq: Two times a day (BID) | ORAL | Status: DC
Start: 2014-04-12 — End: 2014-04-12
  Administered 2014-04-12: 20 mg via ORAL
  Filled 2014-04-12 (×2): qty 1

## 2014-04-12 MED ORDER — CYCLOBENZAPRINE HCL 10 MG PO TABS
10.00 mg | ORAL_TABLET | Freq: Three times a day (TID) | ORAL | Status: DC | PRN
Start: 2014-04-12 — End: 2014-04-12
  Filled 2014-04-12: qty 1

## 2014-04-12 MED ORDER — DEXTROSE 50 % IV SOLN
25.00 mL | INTRAVENOUS | Status: DC | PRN
Start: 2014-04-12 — End: 2014-04-12

## 2014-04-12 MED ORDER — INSULIN ASPART 100 UNIT/ML SC SOPN
1.00 [IU] | PEN_INJECTOR | Freq: Four times a day (QID) | SUBCUTANEOUS | Status: DC
Start: 2014-04-12 — End: 2014-04-12
  Filled 2014-04-12: qty 3

## 2014-04-12 MED ORDER — TECHNETIUM TC 99M SESTAMIBI - CARDIOLITE
10.20 | Freq: Once | Status: AC | PRN
Start: 2014-04-12 — End: 2014-04-12
  Administered 2014-04-12: 10.2 via INTRAVENOUS

## 2014-04-12 MED ORDER — SODIUM CHLORIDE 0.9 % IJ SOLN
0.40 mg | INTRAMUSCULAR | Status: DC | PRN
Start: 2014-04-12 — End: 2014-04-12

## 2014-04-12 MED ORDER — ENOXAPARIN SODIUM 40 MG/0.4ML SC SOLN
40.00 mg | Freq: Every day | SUBCUTANEOUS | Status: DC
Start: 2014-04-12 — End: 2014-04-12
  Filled 2014-04-12: qty 0.4

## 2014-04-12 NOTE — Plan of Care (Signed)
Problem: Health Promotion  Goal: Knowledge - disease process  Extent of understanding conveyed about a specific disease process.   Outcome: Progressing

## 2014-04-12 NOTE — Progress Notes (Signed)
Patient arrived on unit in no distress states he has some chest pressure improved since nitro given before transfer. Denies chest pain or pain of any kind. VSS. Dr. Minus Liberty aware of patients arrival see new orders. Patient oriented to call bell and room. Call bell in reach.

## 2014-04-12 NOTE — Progress Notes (Signed)
0.4 mg. Lexiscan given over 15 sec. Followed by sestamibi  BP response:increased  No cp  No ectopy  No ekg changes    nondiagnostic lexiscan stress  Sestamibi images pending

## 2014-04-12 NOTE — Discharge Summary (Signed)
DISCHARGE NOTE    Date Time: 04/12/2014 12:42 PM  Patient Name: Michael [name]  Attending Physician: Ronnette Juniper, DO    Date of Admission:   04/12/2014    Date of Discharge:   04/12/2014    Reason for Admission:   Chest pain [R07.9]  Chest pain [R07.9]    Problems:   Lists the present on admission hospital problems  Present on Admission:   . Chest pain    Hospital Problems/Discharge Diagnoses  Active Problems:    Chest pain          Subjective:   Feeling good this morning, no chest pain no shortness breath      Review of Systems:   General ROS: negative for - chills or fever  Respiratory ROS: no cough, shortness of breath, or wheezing  Cardiovascular ROS: no chest pain or dyspnea on exertion    Physical Exam:     Filed Vitals:    04/12/14 1233   BP: 123/79   Pulse: 103   Temp: 98.4 F (36.9 C)   Resp: 19   SpO2: 99%       Intake and Output Summary (Last 24 hours) at Date Time  No intake or output data in the 24 hours ending 04/12/14 1242    General appearance - alert, well appearing, and in no distress  Chest - clear to auscultation, no wheezes, rales or rhonchi, symmetric air entry  Heart - normal rate, regular rhythm, normal S1, S2, no murmurs, rubs, clicks or gallops  Abdomen - soft, nontender, nondistended, +BS  Extremities - peripheral pulses normal, no pedal edema, no clubbing or cyanosis    Labs:     Results     Procedure Component Value Units Date/Time    TSH [[id number]] Collected:  04/12/14 0656    Specimen Information:  Plasma Updated:  04/12/14 0754     Thyroid Stimulating Hormone 0.50 uIU/mL     Hemoglobin A1C [[id number]] Collected:  04/12/14 0656    Specimen Information:  Blood Updated:  04/12/14 0741     Hgb A1C, % 11.9 %     Lipid Panel [[id number]]  (Abnormal) Collected:  04/12/14 0656    Specimen Information:  Plasma Updated:  04/12/14 0740     Cholesterol 149 mg/dL      Triglycerides 086 (H) mg/dL      HDL 25 (L) mg/dL      LDL Calculated 47 mg/dL      Coronary Heart Disease Risk 5.96       VLDL 77 (H)     Comprehensive Metabolic Panel [[id number]]  (Abnormal) Collected:  04/12/14 0656    Specimen Information:  Plasma Updated:  04/12/14 0740     Sodium 136 mMol/L      Potassium 4.0 mMol/L      Chloride 103 mMol/L      CO2 24.3 mMol/L      CALCIUM 8.9 mg/dL      Glucose 528 (H) mg/dL      Creatinine 4.13 mg/dL      BUN 16 mg/dL      Protein, Total 6.4 gm/dL      Albumin 3.6 gm/dL      Alkaline Phosphatase 52 U/L      ALT 31 U/L      AST (SGOT) 16 U/L      Bilirubin, Total 1.4 (H) mg/dL      Albumin/Globulin Ratio 1.29 Ratio      Anion Gap 12.7 mMol/L  BUN/Creatinine Ratio 20.0 Ratio      EGFR >60 mL/min/1.21m2      Osmolality Calc 283 mOsm/kg      Globulin 2.8 gm/dL     Troponin I [[id number]] Collected:  04/12/14 0656    Specimen Information:  Plasma Updated:  04/12/14 0740     Troponin I 0.01 ng/mL     Dextrose Stick Glucose [[id number]]  (Abnormal) Collected:  04/12/14 0705    Specimen Information:  Blood Updated:  04/12/14 0724     Glucose, POCT 238 (H) mg/dL     CBC with Automated Differential [[id number]] Collected:  04/12/14 0656    Specimen Information:  Blood / Blood Updated:  04/12/14 0720     WBC 6.4 K/cmm      RBC 4.72 M/cmm      Hemoglobin 13.8 gm/dL      Hematocrit 60.6 %      MCV 84 fL      MCH 29 pg      MCHC 35 gm/dL      RDW 30.1 %      PLT CT 189 K/cmm      MPV 6.8 fL      NEUTROPHIL % 54.8 %      Lymphocytes 34.6 %      Monocytes 6.9 %      Eosinophils % 2.9 %      Basophils % 0.8 %      Neutrophils Absolute 3.5 K/cmm      Lymphocytes Absolute 2.2 K/cmm      Monocytes Absolute 0.4 K/cmm      Eosinophils Absolute 0.2 K/cmm      BASO Absolute 0.1 K/cmm     Troponin I [[id number]] Collected:  04/12/14 0259    Specimen Information:  Plasma Updated:  04/12/14 0345     Troponin I 0.01 ng/mL           Rads:   Radiological Procedure reviewed.    Xr Chest Ap Portable    04/12/2014   Chronic elevation right hemidiaphragm unchanged from 03/18/2013. No focal infiltrate pleural effusion or  pneumothorax. Normal heart size. Degenerative changes of thoracic spine.  ReadingStation:[id number]            Consultations:   None    Procedures performed:   Stress test Cardiolite normal    Hospital Course:   Michael [name] is a 58 y.o. male with PMH CAD,s/p stent X4 last 1 yesr ago,DM insulin dependent,HTN,HLD that has got transferred from [facility] for complaints of retrosternal chest pain pressure like with tingling left left jaw left hand,continuous relieved with nitro with sob and sweating in the around 6PM yesterday , patient blood work and EKG all within normal limits he was admitted to telemetry monitored 24 hours, had a stress test this morning and read as normal, patient was seen and examined his back to his baseline patient will be discharged today    Discharge Medications:     Current Discharge Medication List      CONTINUE these medications which have NOT CHANGED    Details   amitriptyline (ELAVIL) 100 MG tablet Take 100 mg by mouth nightly.      aspirin 81 MG chewable tablet Chew 1 tablet (81 mg total) by mouth daily.  Qty: 90 tablet, Refills: 0      atorvastatin (LIPITOR) 10 MG tablet Take 4 tablets (40 mg total) by mouth nightly.  Qty: 30 tablet, Refills: 3      cyclobenzaprine (FLEXERIL) 10  MG tablet Take 10 mg by mouth every 8 (eight) hours as needed.      fenofibrate (TRICOR) 48 MG tablet Take 2 tablets (96 mg total) by mouth daily.  Qty: 30 tablet, Refills: 3      glimepiride (AMARYL) 4 MG tablet Take 8 mg by mouth every morning before breakfast.      hydrochlorothiazide (HYDRODIURIL) 12.5 MG tablet Take 12.5 mg by mouth daily.      hydrOXYzine (VISTARIL) 50 MG capsule Take 1 capsule (50 mg total) by mouth nightly.  Qty: 30 capsule, Refills: 0      insulin aspart (NOVOLOG) 100 UNIT/ML injection pen Inject 1-5 Units into the skin 4 times daily - with meals and at bedtime.  Qty: 15 mL, Refills: 0      isosorbide mononitrate (IMDUR) 30 MG 24 hr tablet Take 30 mg by mouth daily.        METOPROLOL TARTRATE PO Take 100 mg by mouth 2 (two) times daily.      !! nitroglycerin (NITROSTAT) 0.4 MG SL tablet Place 0.4 mg under the tongue every 5 (five) minutes as needed.      prasugrel (EFFIENT) 5 MG Tab Take 2 tablets (10 mg total) by mouth daily.  Qty: 30 tablet, Refills: 11      ranitidine (ZANTAC) 300 MG tablet Take 300 mg by mouth daily.      sitaGLIPtin (JANUVIA) 100 MG tablet Take 100 mg by mouth daily.      TraMADol HCl 50 MG Tablet Dispersible Take 1-2 tablets by mouth every 8 (eight) hours as needed. 50 mg      insulin glargine (LANTUS SOLOSTAR) 100 UNIT/ML injection pen Inject 10 Units into the skin nightly.  Qty: 15 mL, Refills: 0      !! nitroglycerin (NITROSTAT) 0.4 MG SL tablet Place 1 tablet (0.4 mg total) under the tongue every 5 (five) minutes as needed for Chest pain.  Qty: 90 tablet, Refills: 0       !! - Potential duplicate medications found. Please discuss with provider.            Discharge Instructions:       Disposition:  home    Diet: Cardiac Diet    Activity: As tolerated    Follow-up with:  Alonna Minium, DO  [address]  [address]  [place] [address]  [phone]    In 1 week        Signed by: Edmon Crape, MD

## 2014-04-12 NOTE — Progress Notes (Signed)
Called MD to verify patients code status and to clarify diet order and chemsticks see new orders.

## 2014-04-12 NOTE — Plan of Care (Signed)
Problem: Moderate/High Fall Risk Score >/=15  Goal: Patient will remain free of falls  Outcome: Progressing

## 2014-04-12 NOTE — H&P (Signed)
History of Present Illness  04/12/2014   Admit date / time - 04/12/2014  2:29 AM  Chief complaint :chest pain    HPI:    Michael [name] is a 58 y.o.  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 Lewis [name] not visiting him lately.He was compliant with medication.  He is non smoker non alcoholic  He has significant family h/o heart desea in his father mother and brother.        Past Medical History   Past Medical History   Diagnosis Date   . Gastroesophageal reflux disease    . Diabetes mellitus    . Hyperlipidemia    . Hypertension    . TIA (transient ischemic attack)    . CAD (coronary artery disease)    . Myocardial infarction    . Hernia    . Presence of stent in coronary artery    . Neuropathy      diabetic           Past Surgical History  Past Surgical History   Procedure Laterality Date   . Coronary angioplasty with stent placement       x2    . Neck surgery     . Back surgery     . Coronary angioplasty with stent placement  12/13/12     DES to OM1       Allergies  Allergies   Allergen Reactions   . Gabapentin    . Glucophage [Metformin Hydrochloride]    . Keflex [Cephalexin]    . Morphine        Prior to Admission Medications  No current facility-administered medications for this encounter.         Social History  History   Substance Use Topics   . Smoking status: Never Smoker    . Smokeless tobacco: Never Used   . Alcohol Use: No       Family History  Family history was obtained and was non contributory for cardiac history,  cancer history.     Review of Systems  Ten completed review of systems including eyes, ENT, cardiovascular, respiratory, gastrointestinal, genitourinary, psychiatric, neurologic, integumentary, allergic/hematology,  are reviewed and found unremarkable except pertinent positives mentioned in the history of present illness and past medical history.       Physical Exam  BP 118/76 mmHg  Pulse 74  Temp(Src) 97.7 F (36.5 C) (Oral)  Resp 16  Ht 1.753 m (5\' 9" )  Wt 89.6 kg (197 lb 8.5 oz)  BMI 29.16 kg/m2  No intake or output data in the 24 hours ending 04/12/14 0406  Physical Exam    1) General appearance:  well developed,well nourished, in no apparent acute cardiorespiratory distress.     2) HEENT: Head is atraumatic and normocephalic. Pupils are equally reactive to light and accommodation. Extraocular muscles are intact. Patient has intact external auditory canal. No abnormal lesions or bleeding from nose. Oral mucosa moist with no pharyngeal congestion.     3) Neck: Supple. Trachea is central, no JVD or carotid bruit.  4) Chest: Clear to auscultation bilaterally, no wheezing     6) CVS: The S1, S2 normal. Regular rate and rhythm.No murmur    7) Abdomen:  soft, non tender, non distended, no palpable mass. Bowel sounds audible bowel.     8) Musculoskeletal: Patient has 5/5 motor strength in bilateral upper extremities as well as bilateral LE. No pitting edema in bilateral lower extremities.    9) Neurological: Cranial nerves II-XII intact. Grossly non focal    10) Psychiatric: Alert and oriented x 3. Mood is appropriate.     11) Integumentary: warm with normal skin turgor, no rash    12) Lymphatics: No lymphadenopathy in axillary, cervial and inguinal area.       Labs    Labs Reviewed   TROPONIN I   TROPONIN I   LIPID PANEL   TSH   CBC AND DIFFERENTIAL   COMPREHENSIVE METABOLIC PANEL     No results found.  EKG reviewed by me: NSR @ 72 bpm    Assessment and Plan  Michael [name] is a 58 y.o.  male with PMH CAD,s/p  stent X4 last 1 yesr ago,DM insulin dependent,HTN,HLD that has got transferred from [facility] Michael [name] as unavailable bed.The pt complaints of retrosternal chest pain pressure like with tingling left left jaw left hand,continuous relieved with nitro with sob and sweating in the around 6PM while he was watching TV .He denies nausea,vomiting,dizziness,abdominal pain or diarrhea.    1. Typical chest pain with h/o CAD s/p stent relieved with   Nitro family h/o CAD got aspirin 324 in other hospital   serial troponin  Echo sterss test ordered   cxr ordered please follow the result  Will cont asa metoprolol lipid lowering meds  As needed nitro for chest pain  2.HTN cont home meds  3.Uncontrolled DM may need to adjust lantus doses  check Hba1c,lantus 10units given   Cont home medication          CODE Status : full    GI prophylaxis pepcid  DVT prophylaxis lovenox      Michael [name]  04/12/2014    4:06 AM

## 2014-04-12 NOTE — Plan of Care (Signed)
Problem: Moderate/High Fall Risk Score >/=15  Goal: Patient will remain free of falls  Outcome: Progressing  All safety interventions in place call bell in reach

## 2014-04-12 NOTE — Progress Notes (Signed)
Pt completed Lexiscan portion of Nuc Stress test without difficulty.  Vital signs stable, pt denies chest pain or complaints. Final pictures will be taken in approx 1 hour then pt will return to previous room.

## 2014-04-12 NOTE — Plan of Care (Signed)
Problem: Safety  Goal: Patient will be free from injury during hospitalization  Outcome: Progressing

## 2014-04-12 NOTE — Plan of Care (Signed)
Problem: Chest Pain  Goal: Vital signs and cardiac rhythm stable  Outcome: Progressing  Patient denies chest pain at this time. Chest pressure improved.

## 2014-04-12 NOTE — Progress Notes (Signed)
PRELIMINARY STRESS TEST REPORT  A full report has been dictated and will follow.    Conclusions:  Unremarkable Lexiscan Cardiolite without evidence for infarct or ischemia.  Normal LV systolic function.    Michael Hoppes Melnick, MD  [facility]  [address], [address]  [place], [place] [address]  [phone]

## 2014-04-13 NOTE — UM Notes (Signed)
Dx-chest pain     Hx- gerd , dm , hyperlipidemia , cad tia       Michael [name] is a 58 y.o. 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 Lewis Moccasin not visiting him lately.He was compliant with medication.      EKG reviewed by me: NSR @ 72 bpm      admit to tele , serial enzymed for ett in am if neg  , was done  Cpt [phone]

## 2014-08-25 ENCOUNTER — Ambulatory Visit: Payer: BC Managed Care – PPO | Attending: Urology

## 2014-08-25 DIAGNOSIS — Z01818 Encounter for other preprocedural examination: Secondary | ICD-10-CM

## 2014-08-25 LAB — BASIC METABOLIC PANEL
Anion Gap: 12.9 mMol/L (ref 7.0–18.0)
BUN / Creatinine Ratio: 15.2 Ratio (ref 10.0–30.0)
BUN: 16 mg/dL (ref 7–22)
CO2: 27.5 mMol/L (ref 20.0–30.0)
Calcium: 9.2 mg/dL (ref 8.5–10.5)
Chloride: 98 mMol/L (ref 98–110)
Creatinine: 1.05 mg/dL (ref 0.80–1.30)
EGFR: >60 mL/min/{1.73_m2}
Glucose: 386 mg/dL — ABNORMAL HIGH (ref 70–99)
Osmolality Calc: 285 mOsm/kg (ref 275–300)
Potassium: 4.4 mMol/L (ref 3.5–5.3)
Sodium: 134 mMol/L — ABNORMAL LOW (ref 136–147)

## 2014-08-25 LAB — CBC AND DIFFERENTIAL
Basophils %: 0.6 % (ref 0.0–3.0)
Basophils Absolute: 0 10*3/uL (ref 0.0–0.3)
Eosinophils %: 2.3 % (ref 0.0–7.0)
Eosinophils Absolute: 0.2 10*3/uL (ref 0.0–0.8)
Hematocrit: 42.6 % (ref 39.0–52.5)
Hemoglobin: 15.1 gm/dL (ref 13.0–17.5)
Lymphocytes Absolute: 2.5 10*3/uL (ref 0.6–5.1)
Lymphocytes: 32.3 % (ref 15.0–46.0)
MCH: 29 pg (ref 28–35)
MCHC: 35 gm/dL (ref 32–36)
MCV: 83 fL (ref 80–100)
MPV: 6.9 fL (ref 6.0–10.0)
Monocytes Absolute: 0.5 10*3/uL (ref 0.1–1.7)
Monocytes: 6 % (ref 3.0–15.0)
Neutrophils %: 58.8 % (ref 42.0–78.0)
Neutrophils Absolute: 4.5 10*3/uL (ref 1.7–8.6)
PLT CT: 219 10*3/uL (ref 130–440)
RBC: 5.14 10*6/uL (ref 4.00–5.70)
RDW: 12.7 % (ref 11.0–14.0)
WBC: 7.7 10*3/uL (ref 4.0–11.0)

## 2014-08-25 NOTE — Pre-Procedure Instructions (Signed)
STRESS TEST FROM 11/2012 FAXED TO HIM TO BE SCANNED INTO EPIC.

## 2014-09-08 ENCOUNTER — Ambulatory Visit
Admission: RE | Admit: 2014-09-08 | Discharge: 2014-09-08 | Disposition: A | Discharge status: Home / Self Care | Payer: BC Managed Care – PPO | Attending: Urology | Admitting: Urology

## 2014-09-08 ENCOUNTER — Encounter
Admission: RE | Disposition: A | Discharge status: Home / Self Care | Payer: Self-pay | Source: Home / Self Care | Attending: Urology

## 2014-09-08 ENCOUNTER — Ambulatory Visit: Payer: BC Managed Care – PPO | Admitting: Urology

## 2014-09-08 ENCOUNTER — Ambulatory Visit: Payer: BC Managed Care – PPO | Admitting: Anesthesiology

## 2014-09-08 ENCOUNTER — Encounter: Payer: Self-pay | Admitting: Anesthesiology

## 2014-09-08 DIAGNOSIS — Z8673 Personal history of transient ischemic attack (TIA), and cerebral infarction without residual deficits: Secondary | ICD-10-CM | POA: Insufficient documentation

## 2014-09-08 DIAGNOSIS — I2510 Atherosclerotic heart disease of native coronary artery without angina pectoris: Secondary | ICD-10-CM | POA: Insufficient documentation

## 2014-09-08 DIAGNOSIS — N400 Enlarged prostate without lower urinary tract symptoms: Secondary | ICD-10-CM

## 2014-09-08 DIAGNOSIS — Z79891 Long term (current) use of opiate analgesic: Secondary | ICD-10-CM | POA: Insufficient documentation

## 2014-09-08 DIAGNOSIS — Z955 Presence of coronary angioplasty implant and graft: Secondary | ICD-10-CM | POA: Insufficient documentation

## 2014-09-08 DIAGNOSIS — N481 Balanitis: Secondary | ICD-10-CM

## 2014-09-08 DIAGNOSIS — K219 Gastro-esophageal reflux disease without esophagitis: Secondary | ICD-10-CM | POA: Insufficient documentation

## 2014-09-08 DIAGNOSIS — Z7982 Long term (current) use of aspirin: Secondary | ICD-10-CM | POA: Insufficient documentation

## 2014-09-08 DIAGNOSIS — E785 Hyperlipidemia, unspecified: Secondary | ICD-10-CM | POA: Insufficient documentation

## 2014-09-08 DIAGNOSIS — E1165 Type 2 diabetes mellitus with hyperglycemia: Secondary | ICD-10-CM | POA: Insufficient documentation

## 2014-09-08 DIAGNOSIS — Z833 Family history of diabetes mellitus: Secondary | ICD-10-CM | POA: Insufficient documentation

## 2014-09-08 DIAGNOSIS — Z888 Allergy status to other drugs, medicaments and biological substances status: Secondary | ICD-10-CM | POA: Insufficient documentation

## 2014-09-08 DIAGNOSIS — Z885 Allergy status to narcotic agent status: Secondary | ICD-10-CM | POA: Insufficient documentation

## 2014-09-08 DIAGNOSIS — Z7901 Long term (current) use of anticoagulants: Secondary | ICD-10-CM | POA: Insufficient documentation

## 2014-09-08 DIAGNOSIS — R81 Glycosuria: Secondary | ICD-10-CM

## 2014-09-08 DIAGNOSIS — Z8249 Family history of ischemic heart disease and other diseases of the circulatory system: Secondary | ICD-10-CM | POA: Insufficient documentation

## 2014-09-08 DIAGNOSIS — N471 Phimosis: Secondary | ICD-10-CM | POA: Insufficient documentation

## 2014-09-08 HISTORY — PX: CIRCUMCISION, > 13 YEARS, ADULT: SHX3393

## 2014-09-08 LAB — VH DEXTROSE STICK GLUCOSE
Glucose POCT: 374 mg/dL — ABNORMAL HIGH (ref 70–99)
Glucose POCT: 413 mg/dL — ABNORMAL HIGH (ref 70–99)
Glucose POCT: 393 mg/dL — ABNORMAL HIGH (ref 70–99)
Glucose POCT: 310 mg/dL — ABNORMAL HIGH (ref 70–99)
Glucose POCT: 365 mg/dL — ABNORMAL HIGH (ref 70–99)

## 2014-09-08 SURGERY — CIRCUMCISION, SURGICAL, OLDER THAN 13 YEARS TO ADULT
Anesthesia: Anesthesia General | Site: Penis | Wound class: Clean Contaminated

## 2014-09-08 MED ORDER — LACTATED RINGERS IV SOLN
INTRAVENOUS | Status: DC
Start: 2014-09-08 — End: 2014-09-08

## 2014-09-08 MED ORDER — EPHEDRINE SULFATE 50 MG/ML IJ SOLN
INTRAMUSCULAR | Status: DC | PRN
Start: 2014-09-08 — End: 2014-09-08
  Administered 2014-09-08 (×2): 10 mg via INTRAVENOUS

## 2014-09-08 MED ORDER — ONDANSETRON HCL 4 MG/2ML IJ SOLN
INTRAMUSCULAR | Status: AC
Start: 2014-09-08 — End: ?
  Filled 2014-09-08: qty 2

## 2014-09-08 MED ORDER — FENTANYL CITRATE (PF) 50 MCG/ML IJ SOLN (WRAP)
25.0000 ug | INTRAMUSCULAR | Status: DC | PRN
Start: 2014-09-08 — End: 2014-09-08

## 2014-09-08 MED ORDER — VH PHENYLEPHRINE 40 MCG/ML IV BOLUS (ANESTHESIA)
PREFILLED_SYRINGE | INTRAVENOUS | Status: DC | PRN
Start: 2014-09-08 — End: 2014-09-08
  Administered 2014-09-08: 160 ug via INTRAVENOUS
  Administered 2014-09-08: 80 ug via INTRAVENOUS
  Administered 2014-09-08 (×2): 160 ug via INTRAVENOUS
  Administered 2014-09-08 (×2): 80 ug via INTRAVENOUS

## 2014-09-08 MED ORDER — BUPIVACAINE HCL (PF) 0.25 % IJ SOLN
INTRAMUSCULAR | Status: AC
Start: 2014-09-08 — End: ?
  Filled 2014-09-08: qty 30

## 2014-09-08 MED ORDER — VALLEY PROMETHAZINE 50 MG/0.4 ML TOPICAL GEL UD (RPKG)
12.5000 mg | Freq: Once | TOPICAL | Status: DC
Start: 2014-09-08 — End: 2014-09-08

## 2014-09-08 MED ORDER — VH INSULIN (REGULAR) INFUSION 250 UNIT/250 ML (SIMPLE)
Status: AC
Start: 2014-09-08 — End: ?
  Filled 2014-09-08: qty 250

## 2014-09-08 MED ORDER — INSULIN REGULAR HUMAN 100 UNIT/ML IJ SOLN
1.0000 [IU] | INTRAMUSCULAR | Status: DC | PRN
Start: 2014-09-08 — End: 2014-09-08
  Administered 2014-09-08: 8 [IU] via INTRAVENOUS
  Filled 2014-09-08: qty 3

## 2014-09-08 MED ORDER — FENTANYL CITRATE (PF) 50 MCG/ML IJ SOLN (WRAP)
INTRAMUSCULAR | Status: AC
Start: 2014-09-08 — End: ?
  Filled 2014-09-08: qty 2

## 2014-09-08 MED ORDER — LIDOCAINE HCL (PF) 2 % IJ SOLN
INTRAMUSCULAR | Status: AC
Start: 2014-09-08 — End: ?
  Filled 2014-09-08: qty 10

## 2014-09-08 MED ORDER — DEXTROSE 50 % IV SOLN
25.0000 g | INTRAVENOUS | Status: DC | PRN
Start: 2014-09-08 — End: 2014-09-08

## 2014-09-08 MED ORDER — ONDANSETRON HCL 4 MG/2ML IJ SOLN
4.0000 mg | Freq: Once | INTRAMUSCULAR | Status: DC | PRN
Start: 2014-09-08 — End: 2014-09-08

## 2014-09-08 MED ORDER — BUPIVACAINE HCL (PF) 0.25 % IJ SOLN
INTRAMUSCULAR | Status: DC | PRN
Start: 2014-09-08 — End: 2014-09-08
  Administered 2014-09-08: 30 mL

## 2014-09-08 MED ORDER — INSULIN REGULAR HUMAN 100 UNIT/ML IJ SOLN
INTRAMUSCULAR | Status: DC | PRN
Start: 2014-09-08 — End: 2014-09-08
  Administered 2014-09-08: 10 [IU] via INTRAVENOUS

## 2014-09-08 MED ORDER — VH PROPOFOL INFUSION 10 MG/ML (WRAPPED)
INTRAVENOUS | Status: DC | PRN
Start: 2014-09-08 — End: 2014-09-08
  Administered 2014-09-08: 50 mg via INTRAVENOUS
  Administered 2014-09-08: 150 mg via INTRAVENOUS

## 2014-09-08 MED ORDER — BACITRACIN 500 UNIT/GM EX OINT
TOPICAL_OINTMENT | CUTANEOUS | Status: DC | PRN
Start: 2014-09-08 — End: 2014-09-08
  Administered 2014-09-08: 28 g via TOPICAL

## 2014-09-08 MED ORDER — MIDAZOLAM HCL 2 MG/2ML IJ SOLN
INTRAMUSCULAR | Status: AC
Start: 2014-09-08 — End: ?
  Filled 2014-09-08: qty 2

## 2014-09-08 MED ORDER — VH HYDROMORPHONE HCL PF 1 MG/ML CARPUJECT
0.5000 mg | INTRAMUSCULAR | Status: DC | PRN
Start: 2014-09-08 — End: 2014-09-08

## 2014-09-08 MED ORDER — FENTANYL CITRATE (PF) 50 MCG/ML IJ SOLN (WRAP)
INTRAMUSCULAR | Status: DC | PRN
Start: 2014-09-08 — End: 2014-09-08
  Administered 2014-09-08: 25 ug via INTRAVENOUS

## 2014-09-08 MED ORDER — BACITRACIN +/- ZINC 500 UNIT/GM EX OINT (WRAP)
TOPICAL_OINTMENT | CUTANEOUS | Status: AC
Start: 2014-09-08 — End: ?
  Filled 2014-09-08: qty 28.35

## 2014-09-08 MED ORDER — LIDOCAINE HCL 2 % IJ SOLN
INTRAMUSCULAR | Status: DC | PRN
Start: 2014-09-08 — End: 2014-09-08
  Administered 2014-09-08: 100 mg

## 2014-09-08 MED ORDER — INSULIN REGULAR HUMAN 100 UNIT/ML IJ SOLN
6.0000 [IU] | Freq: Once | INTRAMUSCULAR | Status: AC
Start: 2014-09-08 — End: 2014-09-08
  Administered 2014-09-08: 6 [IU] via SUBCUTANEOUS
  Filled 2014-09-08: qty 3

## 2014-09-08 MED ORDER — PROPOFOL 10 MG/ML IV EMUL (WRAP)
INTRAVENOUS | Status: AC
Start: 2014-09-08 — End: ?
  Filled 2014-09-08: qty 20

## 2014-09-08 MED ORDER — MIDAZOLAM HCL 2 MG/2ML IJ SOLN
INTRAMUSCULAR | Status: DC | PRN
Start: 2014-09-08 — End: 2014-09-08
  Administered 2014-09-08: 2 mg via INTRAVENOUS

## 2014-09-08 MED ORDER — INSULIN REGULAR HUMAN 100 UNIT/ML IJ SOLN
INTRAMUSCULAR | Status: AC
Start: 2014-09-08 — End: ?
  Filled 2014-09-08: qty 6

## 2014-09-08 MED ORDER — HEPARIN SODIUM (PORCINE) PF 5000 UNIT/0.5ML IJ SOLN
5000.0000 [IU] | INTRAMUSCULAR | Status: AC
Start: 2014-09-08 — End: 2014-09-08
  Administered 2014-09-08: 5000 [IU] via SUBCUTANEOUS
  Filled 2014-09-08: qty 0.5

## 2014-09-08 MED ORDER — ONDANSETRON HCL 4 MG/2ML IJ SOLN
INTRAMUSCULAR | Status: DC | PRN
Start: 2014-09-08 — End: 2014-09-08
  Administered 2014-09-08: 4 mg via INTRAVENOUS

## 2014-09-08 MED ORDER — CIPROFLOXACIN IN D5W 400 MG/200ML IV SOLN
400.0000 mg | INTRAVENOUS | Status: AC
Start: 2014-09-08 — End: 2014-09-08
  Administered 2014-09-08: 400 mg via INTRAVENOUS
  Filled 2014-09-08: qty 200

## 2014-09-08 SURGICAL SUPPLY — 14 items
BANDAGE COBAN 1INX5YD UNST LF (Dressings) ×1 IMPLANT
BANDAGE COFLEX 1INX5YD UNST LF (Dressings) ×2
CHLORAPREP W/ORANGE TINT 26ML (Supply) ×3 IMPLANT
DRAPE PEDIATRIC W/ARMBOARD (Supply) ×3 IMPLANT
DRAPE Z-FRICTION (Supply) IMPLANT
GLOVE BIOGEL UT PI SURG SZ 7.5 (Supply) ×3 IMPLANT
GOWN STD LG W/TOWEL REUSE (Supply) ×3 IMPLANT
GOWN STD XL W/TOWEL REUSE (Supply) ×4 IMPLANT
PACK MINOR BASIC (Supply) ×3 IMPLANT
SOL SALINE IRRIG 500ML (Solutions) ×3 IMPLANT
SOL WATER STERILE IRRG 500ML (Solutions) ×1 IMPLANT
SOLUTION POVIDONE IODINE 4 OZ (Supply) IMPLANT
SUT CHROMIC GUT 3-0 636H (Supply) ×2 IMPLANT
SUT VICRYL 3-0 J305H (Supply) ×1 IMPLANT

## 2014-09-08 NOTE — Anesthesia Preprocedure Evaluation (Addendum)
Anesthesia Evaluation    AIRWAY    Mallampati: II    TM distance: >3 FB  Neck ROM: full  Mouth Opening:full   CARDIOVASCULAR    regular and normal       DENTAL           PULMONARY    clear to auscultation     OTHER FINDINGS              PSS Anesthesia Comments: Normal stress test 04/12/2014.        Anesthesia Plan    ASA 2     general                     intravenous induction   Detailed anesthesia plan: general LMA            informed consent obtained    Plan discussed with CRNA.    ECG reviewed  pertinent labs reviewed

## 2014-09-08 NOTE — Progress Notes (Signed)
I have reviewed the H&P, examined the patient and there are no changes.

## 2014-09-08 NOTE — Op Note (Signed)
Date: 09/08/2014  PREOPERATIVE DIAGNOSES: Poorly controlled diabetes.  Balanitis.  Phimosis.  Recurrent infections.  It has been more than  difficult for the patient to achieve even basic diabetic  control.  His previous primary care physician, Alonna Minium,  DO has apparently relocated from [place], [place] to  "somewhere in [place]."  We have been attempting to arrange for  outpatient circumcision throughout March and April 2016.  Patient presents today for outpatient circumcision.  Glucose on  admission 379.    POSTOPERATIVE DIAGNOSES: Poorly controlled diabetes. Balanitis.  Phimosis. Recurrent infections. It has been more than difficult  for the patient to achieve even basic diabetic control. His  previous primary care physician, Alonna Minium, DO has  apparently relocated from [place], [place] to  "somewhere in [place]." We have been attempting to arrange for  outpatient circumcision throughout March and April 2016. Patient  presents today for outpatient circumcision. Glucose on admission  379.    PROCEDURE: Circumcision.    ANESTHESIA: General.    DRAINS: None.    COMPLICATIONS: None.    ESTIMATED BLOOD LOSS: 10 cc.    SURGEON: Ellwood Handler, MD.    ASSISTANTS: None.    SPECIMENS: None.    DESCRIPTION OF PROCEDURE: Patient was prepped and draped in the  supine position.  After institution of adequate level of general  anesthesia, penile block with 8.5 cc of 0.25% lidocaine without  epinephrine was instituted.  Pressure points were well padded  and the preoperative checklist was reviewed.  Circumferential  incision was made proximal to the subcoronal sulcus.  Similar  incision was made proximal to the original incision and a ring  of densely fibrotic preputial skin was removed in a "parallel  lines technique."  Needle tipped Bovie was used to obtain  adequate hemostasis.  Subcutaneous tissue was then  reapproximated using sutures of 2-0 chromic.  The skin was  reapproximated  with interrupted sutures of 2-0 Vicryl with the  sutures of 2-0 chromic in between the Vicryl sutures.  The  patient had no evidence of meatal stenosis.  Angled hemostat  passed easily at the urethral meatus.  Wound was covered with  bacitracin ointment, dry gauze, and Coban tape.  The patient was  returned to recovery in satisfactory condition.  Discharge note.  The patient claims to be accompanied today by his "brother."  I  have tried on two occasions to speak face-to-face to the brother  and provide him with Coban tape, dry gauze, bacitracin ointment,  prescription for Cipro as well as for pain medication with plans  for a followup visit in about one month.        [id number]  DD: 09/08/2014 13:32:53  DT: 09/08/2014 14:57:57  JOB: [id number]

## 2014-09-08 NOTE — Op Note (Signed)
FULL OPERATIVE NOTE    Dictation Time: 09/08/2014 1:31 PM  Patient Name: [name]  Attending Physician: Ellwood Handler*    Date of Operation:   09/08/2014    Preoperative Diagnosis:   Pre-Op Diagnosis Codes:     * Phimosis [N47.1]    POORLY CONTROLLED DM     BALANITIS     Operative Procedure:   Procedure(s):  CIRCUMCISION, > 13 YEARS, ADULT    Postoperative Diagnosis: Intraoperative Findings :   * No post-op diagnosis entered *    SAME     Providers Performing:   Surgeon(s):  Ellwood Handler, MD    Assistant (s):     Anesthesia:     GENERAL     Estimated Blood Loss:        10  CC  Specimens:        SPECIMENS (last 24 hours)      Pathology Specimens       09/08/14 1256             Specimen Information    Specimen Testing Required None per surgeon           Complications:   * No complications entered in OR log *  NONE OBVIOUS     Drains:     NONE     Operative Notes:      JOB # [id number]    Signed by: Ellwood Handler, MD

## 2014-09-08 NOTE — Anesthesia Postprocedure Evaluation (Signed)
Anesthesia Post Evaluation    Patient: Michael [name]    Procedures performed: Procedure(s) with comments:  CIRCUMCISION, > 13 YEARS, ADULT - CIRCUMCISION    Anesthesia type: General LMA    Patient location:Phase I PACU    Last vitals:   Filed Vitals:    09/08/14 1400   BP: 120/76   Pulse: 89   Temp:    Resp: 16   SpO2: 95%       Post pain: Patient not complaining of pain, continue current therapy      Mental Status:awake    Respiratory Function: tolerating room air    Cardiovascular: stable    Nausea/Vomiting: patient not complaining of nausea or vomiting    Hydration Status: adequate    Post assessment: no apparent anesthetic complications

## 2014-09-08 NOTE — Transfer of Care (Signed)
Anesthesia Transfer of Care Note    Patient: Michael [name]    Last vitals:   Filed Vitals:    09/08/14 1334   BP: 116/79   Pulse: 91   Temp: 36.9 C (98.4 F)   Resp: 18   SpO2: 98%       Oxygen: Nasal Cannula     Mental Status:awake and alert     Airway: Natural    Cardiovascular Status:  stable

## 2014-09-08 NOTE — Discharge Instructions (Signed)
Adult Circumcision    After the Procedure  You will be taken to a recovery area where you'llrecover from the anesthesia. Nurses will check on you as you rest. They can also give you pain medication if needed. Your doctor will tell you when it's okay for you to go home. This will be the same day. When you dress to go home, wear snug-fitting, brief-style underwear. This will help hold your bandage in place. You will also be given care instructions for when you return home.  What to Expect   You will probably see a crust of blood or yellowish coatingaround the head of the penis. Do not remove scabs. It's okay if they fall off on their own.   The penis will swell. It may bleed a little around the incision.   The head of the penis will be red or black-and-blue.   You may have pain with urination for the first few days.   Take pain medication as instructed by your health care provider.   Healing takes about2weeks. The stitches should dissolve on their own.  Caring for Your Penis   You may shower24hours after surgery. When drying off, gently pat the penis dry.   Don't take a bath or use a hot tub, Jacuzzi, or swimming pool for2weeks after surgery.   Follow your health care provider's instructions for bandage care. Change or remove the bandage only when told to do so by your health care provider. This will likely be the day after surgery.   Avoid all sexual activity for4-6weeks after surgery. An erection can cause the incisions to open. Ask your health care provider what you can do to help stop erections.  Follow-Up  Make a follow-up appointment as directed by our staff.     [address] [employer] [address], [place], [place] [address]. All rights reserved. This information is not intended as a substitute for professional medical care. Always follow your healthcare professional's instructions.        Discharge Instructions: After Your Surgery  You've just had surgery. During surgery  you were given medicine called anesthesia to keep you relaxed and free of pain. After surgery you may have some pain or nausea. This is common. Here are some tips for feeling better and getting well after surgery.    Going home  Your doctor or nurse will show you how to take care of yourself when you go home. He or she will also answer your questions. Have an adult family member or friend drive you home. For the first 24 hours after your surgery:   Do not drive or use heavy equipment.   Do not make important decisions or sign legal papers.   Do not drink alcohol.   Have someone stay with you, if needed. He or she can watch for problems and help keep you safe.  Be sure to go to all follow-up visits with your doctor. And rest after your surgery for as Faithann Natal as your doctor tells you to.  Coping with pain  If you have pain after surgery, pain medicine will help you feel better. Take it as told, before pain becomes severe. Also, ask your doctor or pharmacist about other ways to control pain. This might be with heat, ice, or relaxation. And follow any other instructions your surgeon or nurse gives you.  Tips for taking pain medicine  To get the best relief possible, remember these points:   Pain medicines can upset your stomach. Taking  them with a little food may help.   Most pain relievers taken by mouth need at least 20 to 30 minutes to start to work.   Taking medicine on a schedule can help you remember to take it. Try to time your medicine so that you can take it before starting an activity. This might be before you get dressed, go for a walk, or sit down for dinner.   Constipation is a common side effect of pain medicines. Call your doctor before taking any medicines such as laxatives or stool softeners to help ease constipation. Also ask if you should skip any foods. Drinkinglots of fluids andeating foodssuch as fruits and vegetables that are high in fiber can also help. Remember, do not take laxatives  unless your surgeon has prescribed them.   Drinking alcohol and taking pain medicine can cause dizziness and slow your breathing. It can even be deadly. Do not drink alcohol while taking pain medicine.   Pain medicine can make you react more slowly to things. Do not drive or run machinery while taking pain medicine.  Your health care providermay tell you to take acetaminophen to help ease your pain. Ask him or her how much you are supposed to take each day. Acetaminophen or other pain relievers may interact with your prescription medicines or other over-the-counter (OTC) drugs. Some prescription medicines have acetaminophen and other ingredients.Using both prescription and OTC acetaminophenfor paincan cause you to overdose. Readthe labels on your OTC medicineswith care. This will help youto clearly know the list of ingredients, how much to take, and anywarnings. It may also help you not take too muchacetaminophen.If you have questions or do not understand the information, ask your pharmacist or health care provider to explain it to you before you take the OTC medicine.  Managing nausea  Some people have an upset stomach after surgery. This is often because of anesthesia, pain, or pain medicine, or the stress of surgery. These tips will help you handle nausea and eat healthy foods as you get better. If you were on a special food plan before surgery, ask your doctor if you should follow it while you get better. These tips may help:   Do not push yourself to eat. Your body will tell you when to eat and how much.   Start off with clear liquids and soup. They are easier to digest.   Next try semi-solid foods, such as mashed potatoes, applesauce, and gelatin, as you feel ready.   Slowly move to solid foods. Don't eat fatty, rich, or spicy foods at first.   Do not force yourself to have 3 large meals a day. Instead eat smaller amounts more often.   Take pain medicines with a small amount of solid food,  such as crackers or toast, to avoid nausea.         If you have obstructive sleep apnea  You were given anesthesia medicine during surgery to keep you comfortable and free of pain. After surgery, you may have more apnea spells because of this medicine and other medicines you were given. The spells may last longer than usual.  At home:   Keep using the continuous positive airway pressure (CPAP) device when you sleep. Unless your health care provider tells you not to, use it when you sleep, day or night. CPAP is a common device used to treat obstructive sleep apnea.   Talk with your provider before taking any pain medicine, muscle relaxants, or sedatives. Your provider will tell  you about the possible dangers of taking these medicines.   [address] [employer] [address], [place], [place] [address]. All rights reserved. This information is not intended as a substitute for professional medical care. Always follow your healthcare professional's instructions.

## 2014-09-10 ENCOUNTER — Encounter: Payer: Self-pay | Admitting: Urology

## 2014-10-22 ENCOUNTER — Inpatient Hospital Stay: Payer: BC Managed Care – PPO | Admitting: Internal Medicine

## 2014-10-22 ENCOUNTER — Inpatient Hospital Stay
Admission: RE | Admit: 2014-10-22 | Discharge: 2014-10-26 | DRG: 638 | Disposition: A | Discharge status: Home Health | Payer: BC Managed Care – PPO | Source: Other Acute Inpatient Hospital | Attending: Internal Medicine | Admitting: Internal Medicine

## 2014-10-22 DIAGNOSIS — Z888 Allergy status to other drugs, medicaments and biological substances status: Secondary | ICD-10-CM

## 2014-10-22 DIAGNOSIS — E1140 Type 2 diabetes mellitus with diabetic neuropathy, unspecified: Secondary | ICD-10-CM | POA: Diagnosis present

## 2014-10-22 DIAGNOSIS — Z7982 Long term (current) use of aspirin: Secondary | ICD-10-CM

## 2014-10-22 DIAGNOSIS — K219 Gastro-esophageal reflux disease without esophagitis: Secondary | ICD-10-CM | POA: Diagnosis present

## 2014-10-22 DIAGNOSIS — I10 Essential (primary) hypertension: Secondary | ICD-10-CM | POA: Diagnosis present

## 2014-10-22 DIAGNOSIS — Z7902 Long term (current) use of antithrombotics/antiplatelets: Secondary | ICD-10-CM

## 2014-10-22 DIAGNOSIS — E785 Hyperlipidemia, unspecified: Secondary | ICD-10-CM | POA: Diagnosis present

## 2014-10-22 DIAGNOSIS — E119 Type 2 diabetes mellitus without complications: Secondary | ICD-10-CM | POA: Diagnosis present

## 2014-10-22 DIAGNOSIS — M00042 Staphylococcal arthritis, left hand: Secondary | ICD-10-CM | POA: Diagnosis present

## 2014-10-22 DIAGNOSIS — Z8249 Family history of ischemic heart disease and other diseases of the circulatory system: Secondary | ICD-10-CM

## 2014-10-22 DIAGNOSIS — Z955 Presence of coronary angioplasty implant and graft: Secondary | ICD-10-CM

## 2014-10-22 DIAGNOSIS — I2510 Atherosclerotic heart disease of native coronary artery without angina pectoris: Secondary | ICD-10-CM | POA: Diagnosis present

## 2014-10-22 DIAGNOSIS — I252 Old myocardial infarction: Secondary | ICD-10-CM

## 2014-10-22 DIAGNOSIS — Z981 Arthrodesis status: Secondary | ICD-10-CM

## 2014-10-22 DIAGNOSIS — Z833 Family history of diabetes mellitus: Secondary | ICD-10-CM

## 2014-10-22 DIAGNOSIS — L03012 Cellulitis of left finger: Secondary | ICD-10-CM | POA: Diagnosis present

## 2014-10-22 DIAGNOSIS — M20012 Mallet finger of left finger(s): Secondary | ICD-10-CM | POA: Diagnosis present

## 2014-10-22 DIAGNOSIS — Z881 Allergy status to other antibiotic agents status: Secondary | ICD-10-CM

## 2014-10-22 DIAGNOSIS — B999 Unspecified infectious disease: Secondary | ICD-10-CM | POA: Diagnosis present

## 2014-10-22 DIAGNOSIS — E1169 Type 2 diabetes mellitus with other specified complication: Principal | ICD-10-CM | POA: Diagnosis present

## 2014-10-22 DIAGNOSIS — Z8673 Personal history of transient ischemic attack (TIA), and cerebral infarction without residual deficits: Secondary | ICD-10-CM

## 2014-10-22 DIAGNOSIS — Z823 Family history of stroke: Secondary | ICD-10-CM

## 2014-10-22 DIAGNOSIS — L03019 Cellulitis of unspecified finger: Secondary | ICD-10-CM

## 2014-10-22 DIAGNOSIS — B957 Other staphylococcus as the cause of diseases classified elsewhere: Secondary | ICD-10-CM | POA: Diagnosis present

## 2014-10-22 DIAGNOSIS — Z885 Allergy status to narcotic agent status: Secondary | ICD-10-CM

## 2014-10-22 DIAGNOSIS — M868X4 Other osteomyelitis, hand: Secondary | ICD-10-CM | POA: Diagnosis present

## 2014-10-22 HISTORY — DX: Cellulitis of left finger: L03.012

## 2014-10-22 LAB — MAGNESIUM: Magnesium: 1.9 mg/dL (ref 1.6–2.6)

## 2014-10-22 LAB — CBC AND DIFFERENTIAL
Basophils %: 0.5 % (ref 0.0–3.0)
Basophils Absolute: 0 10*3/uL (ref 0.0–0.3)
Eosinophils %: 4.3 % (ref 0.0–7.0)
Eosinophils Absolute: 0.2 10*3/uL (ref 0.0–0.8)
Hematocrit: 38.9 % — ABNORMAL LOW (ref 39.0–52.5)
Hemoglobin: 13.1 gm/dL (ref 13.0–17.5)
Lymphocytes Absolute: 2.4 10*3/uL (ref 0.6–5.1)
Lymphocytes: 41.9 % (ref 15.0–46.0)
MCH: 28 pg (ref 28–35)
MCHC: 34 gm/dL (ref 32–36)
MCV: 83 fL (ref 80–100)
MPV: 6.9 fL (ref 6.0–10.0)
Monocytes Absolute: 0.4 10*3/uL (ref 0.1–1.7)
Monocytes: 7.5 % (ref 3.0–15.0)
Neutrophils %: 45.8 % (ref 42.0–78.0)
Neutrophils Absolute: 2.6 10*3/uL (ref 1.7–8.6)
PLT CT: 212 10*3/uL (ref 130–440)
RBC: 4.68 10*6/uL (ref 4.00–5.70)
RDW: 12.4 % (ref 11.0–14.0)
WBC: 5.7 10*3/uL (ref 4.0–11.0)

## 2014-10-22 LAB — BASIC METABOLIC PANEL
Anion Gap: 14.1 mMol/L (ref 7.0–18.0)
BUN / Creatinine Ratio: 12.3 Ratio (ref 10.0–30.0)
BUN: 14 mg/dL (ref 7–22)
CO2: 27.2 mMol/L (ref 20.0–30.0)
Calcium: 9.2 mg/dL (ref 8.5–10.5)
Chloride: 101 mMol/L (ref 98–110)
Creatinine: 1.14 mg/dL (ref 0.80–1.30)
EGFR: >60 mL/min/{1.73_m2}
Glucose: 298 mg/dL — ABNORMAL HIGH (ref 70–99)
Osmolality Calc: 287 mOsm/kg (ref 275–300)
Potassium: 4.3 mMol/L (ref 3.5–5.3)
Sodium: 138 mMol/L (ref 136–147)

## 2014-10-22 LAB — VH DEXTROSE STICK GLUCOSE: Glucose POCT: 296 mg/dL — ABNORMAL HIGH (ref 70–99)

## 2014-10-22 LAB — PT AND APTT
PT INR: 1.1 (ref 0.5–1.3)
PT: 11.7 s — ABNORMAL HIGH (ref 9.5–11.5)
aPTT: 22.4 s — ABNORMAL LOW (ref 24.0–34.0)

## 2014-10-22 LAB — URIC ACID: Uric acid: 4.5 mg/dL (ref 3.5–7.2)

## 2014-10-22 LAB — PHOSPHORUS: Phosphorus: 3.4 mg/dL (ref 2.3–4.7)

## 2014-10-22 MED ORDER — PRASUGREL HCL 10 MG PO TABS
10.0000 mg | ORAL_TABLET | Freq: Every day | ORAL | Status: DC
Start: 2014-10-22 — End: 2014-10-26
  Administered 2014-10-22 – 2014-10-26 (×5): 10 mg via ORAL
  Filled 2014-10-22 (×5): qty 1

## 2014-10-22 MED ORDER — METOPROLOL TARTRATE 100 MG PO TABS
100.0000 mg | ORAL_TABLET | Freq: Two times a day (BID) | ORAL | Status: DC
Start: 2014-10-22 — End: 2014-10-26
  Administered 2014-10-22 – 2014-10-26 (×4): 100 mg via ORAL
  Filled 2014-10-22 (×9): qty 1

## 2014-10-22 MED ORDER — ATORVASTATIN CALCIUM 40 MG PO TABS
40.0000 mg | ORAL_TABLET | Freq: Every evening | ORAL | Status: DC
Start: 2014-10-22 — End: 2014-10-26
  Administered 2014-10-22 – 2014-10-25 (×4): 40 mg via ORAL
  Filled 2014-10-22 (×5): qty 1

## 2014-10-22 MED ORDER — OXYCODONE-ACETAMINOPHEN 7.5-325 MG PO TABS
1.0000 | ORAL_TABLET | ORAL | Status: DC | PRN
Start: 2014-10-22 — End: 2014-10-26
  Administered 2014-10-22 – 2014-10-26 (×12): 1 via ORAL
  Filled 2014-10-22 (×12): qty 1

## 2014-10-22 MED ORDER — ISOSORBIDE MONONITRATE ER 30 MG PO TB24
30.0000 mg | ORAL_TABLET | Freq: Every day | ORAL | Status: DC
Start: 2014-10-23 — End: 2014-10-26
  Administered 2014-10-23 – 2014-10-26 (×4): 30 mg via ORAL
  Filled 2014-10-22 (×5): qty 1

## 2014-10-22 MED ORDER — INSULIN GLARGINE 100 UNIT/ML SC SOPN
10.0000 [IU] | PEN_INJECTOR | Freq: Every evening | SUBCUTANEOUS | Status: DC
Start: 2014-10-22 — End: 2014-10-26
  Administered 2014-10-22 – 2014-10-25 (×4): 10 [IU] via SUBCUTANEOUS
  Filled 2014-10-22: qty 3

## 2014-10-22 MED ORDER — DEXTROSE 50 % IV SOLN
25.0000 mL | INTRAVENOUS | Status: DC | PRN
Start: 2014-10-22 — End: 2014-10-26

## 2014-10-22 MED ORDER — AMITRIPTYLINE HCL 50 MG PO TABS
100.0000 mg | ORAL_TABLET | Freq: Every evening | ORAL | Status: DC
Start: 2014-10-22 — End: 2014-10-26
  Administered 2014-10-22 – 2014-10-25 (×4): 100 mg via ORAL
  Filled 2014-10-22 (×5): qty 2

## 2014-10-22 MED ORDER — INSULIN ASPART 100 UNIT/ML SC SOPN
1.0000 [IU] | PEN_INJECTOR | Freq: Every evening | SUBCUTANEOUS | Status: DC | PRN
Start: 2014-10-22 — End: 2014-10-26
  Administered 2014-10-23: 3 [IU] via SUBCUTANEOUS
  Administered 2014-10-25: 1 [IU] via SUBCUTANEOUS
  Filled 2014-10-22: qty 3

## 2014-10-22 MED ORDER — HYDROCHLOROTHIAZIDE 12.5 MG PO TABS
12.5000 mg | ORAL_TABLET | Freq: Every day | ORAL | Status: DC
Start: 2014-10-22 — End: 2014-10-26
  Administered 2014-10-22 – 2014-10-26 (×2): 12.5 mg via ORAL
  Filled 2014-10-22 (×5): qty 1

## 2014-10-22 MED ORDER — NITROGLYCERIN 0.4 MG SL SUBL
0.4000 mg | SUBLINGUAL_TABLET | SUBLINGUAL | Status: DC | PRN
Start: 2014-10-22 — End: 2014-10-26

## 2014-10-22 MED ORDER — VANCOMYCIN HCL IN DEXTROSE 1-5 GM/200ML-% IV SOLN
1000.0000 mg | Freq: Two times a day (BID) | INTRAVENOUS | Status: DC
Start: 2014-10-22 — End: 2014-10-23
  Administered 2014-10-23: 1000 mg via INTRAVENOUS
  Filled 2014-10-22: qty 1000

## 2014-10-22 MED ORDER — ONDANSETRON HCL 4 MG/2ML IJ SOLN
4.0000 mg | Freq: Three times a day (TID) | INTRAMUSCULAR | Status: DC | PRN
Start: 2014-10-22 — End: 2014-10-26

## 2014-10-22 MED ORDER — ASPIRIN 81 MG PO CHEW
81.0000 mg | CHEWABLE_TABLET | Freq: Every day | ORAL | Status: DC
Start: 2014-10-22 — End: 2014-10-26
  Administered 2014-10-22 – 2014-10-26 (×5): 81 mg via ORAL
  Filled 2014-10-22 (×6): qty 1

## 2014-10-22 MED ORDER — ACETAMINOPHEN 650 MG RE SUPP
650.0000 mg | RECTAL | Status: DC | PRN
Start: 2014-10-22 — End: 2014-10-26

## 2014-10-22 MED ORDER — SODIUM CHLORIDE 0.9 % IJ SOLN
3.0000 mL | Freq: Three times a day (TID) | INTRAMUSCULAR | Status: DC
Start: 2014-10-22 — End: 2014-10-26
  Administered 2014-10-22 – 2014-10-25 (×6): 3 mL via INTRAVENOUS

## 2014-10-22 MED ORDER — SITAGLIPTIN PHOSPHATE 100 MG PO TABS
100.0000 mg | ORAL_TABLET | Freq: Every day | ORAL | Status: DC
Start: 2014-10-22 — End: 2014-10-26
  Administered 2014-10-22 – 2014-10-26 (×5): 100 mg via ORAL
  Filled 2014-10-22 (×5): qty 1

## 2014-10-22 MED ORDER — FENOFIBRATE 54 MG PO TABS
54.0000 mg | ORAL_TABLET | Freq: Every day | ORAL | Status: DC
Start: 2014-10-22 — End: 2014-10-26
  Administered 2014-10-22 – 2014-10-26 (×5): 54 mg via ORAL
  Filled 2014-10-22 (×5): qty 1

## 2014-10-22 MED ORDER — HYDROXYZINE PAMOATE 50 MG PO CAPS
50.0000 mg | ORAL_CAPSULE | Freq: Every evening | ORAL | Status: DC
Start: 2014-10-22 — End: 2014-10-26
  Administered 2014-10-22 – 2014-10-25 (×4): 50 mg via ORAL
  Filled 2014-10-22 (×5): qty 1

## 2014-10-22 MED ORDER — SODIUM CHLORIDE 0.9 % IJ SOLN
0.4000 mg | INTRAMUSCULAR | Status: DC | PRN
Start: 2014-10-22 — End: 2014-10-26

## 2014-10-22 MED ORDER — GLIMEPIRIDE 2 MG PO TABS
8.0000 mg | ORAL_TABLET | Freq: Every morning | ORAL | Status: DC
Start: 2014-10-23 — End: 2014-10-26
  Administered 2014-10-23 – 2014-10-26 (×4): 8 mg via ORAL
  Filled 2014-10-22 (×4): qty 4

## 2014-10-22 MED ORDER — ENOXAPARIN SODIUM 40 MG/0.4ML SC SOLN
40.0000 mg | Freq: Every evening | SUBCUTANEOUS | Status: DC
Start: 2014-10-22 — End: 2014-10-26
  Administered 2014-10-22 – 2014-10-25 (×4): 40 mg via SUBCUTANEOUS
  Filled 2014-10-22 (×6): qty 0.4

## 2014-10-22 MED ORDER — FAMOTIDINE 20 MG PO TABS
20.0000 mg | ORAL_TABLET | Freq: Two times a day (BID) | ORAL | Status: DC
Start: 2014-10-22 — End: 2014-10-26
  Administered 2014-10-22 – 2014-10-26 (×8): 20 mg via ORAL
  Filled 2014-10-22 (×9): qty 1

## 2014-10-22 MED ORDER — ACETAMINOPHEN 325 MG PO TABS
650.0000 mg | ORAL_TABLET | ORAL | Status: DC | PRN
Start: 2014-10-22 — End: 2014-10-26

## 2014-10-22 MED ORDER — INSULIN ASPART 100 UNIT/ML SC SOPN
1.0000 [IU] | PEN_INJECTOR | Freq: Three times a day (TID) | SUBCUTANEOUS | Status: DC | PRN
Start: 2014-10-22 — End: 2014-10-26
  Administered 2014-10-23: 2 [IU] via SUBCUTANEOUS
  Administered 2014-10-23: 1 [IU] via SUBCUTANEOUS
  Administered 2014-10-24 (×2): 2 [IU] via SUBCUTANEOUS
  Administered 2014-10-24: 1 [IU] via SUBCUTANEOUS
  Administered 2014-10-25: 2 [IU] via SUBCUTANEOUS
  Administered 2014-10-25: 1 [IU] via SUBCUTANEOUS
  Administered 2014-10-26: 2 [IU] via SUBCUTANEOUS
  Filled 2014-10-22: qty 3

## 2014-10-22 NOTE — H&P (Signed)
HISTORY AND PHYSICAL - [facility]    Date Time: 10/22/2014 10:25 PM  Patient Name: Michael [name]  Attending Physician:  Darra Lis, MD  Primary Care Physician:  Tery Sanfilippo  MRN: [id number]       DOB: 10/26/1955    CC: <principal problem not specified>    Problem List:                                                                                  [facility]   Active Problems:    CAD (coronary artery disease)    Cellulitis of finger    Type 2 diabetes mellitus    Hypertension    Cellulitis of middle finger, left      Assessment and Plan:                                                                     [facility]        Cellulitis rule out osteomyelitis left middle finger. Failed outpatient oral antibiotic treatment. Patient is admitted as a direct admission. Has recent culture report of MRSA. Patient will be on IV vancomycin. We will do CT scan of left middle finger to rule out osteomyelitis. Orthopedic consultation will be requested.    Type 2 diabetes mellitus. Patient will be on his home dose medications for type 2 diabetes mellitus. He would also be on Accu-Chek before meals and at bedtime with corrective NovoLog coverage.    Hypertension. Continue home medications with parameters.    Coronary artery disease. Stable at present time. Continue home medications.         Service status: Inpatient: risk of morbidity and mortality          History of Presenting Illness and ROS:                                   [facility] Michael [name] is a 59 y.o. male who presents to the [facility]. As a transfer from [facility] for evaluation and management of left medial finger swelling and erythema failed outpatient oral antibiotic.    Patient was in his usual state of health until 5 weeks back at which time he started to experience painful erythematosus swelling at the distal interphalangeal joint of his left middle finger. He squeezed it and  weakness a thick  yellowish material coming out of it. He went to his primary care provider and he was started on oral antibiotics tablet to be taken twice a day.    4 days after the oral antibiotics patient went to the emergency room because of worsening swelling and erythema left middle finger this time extending to dorsum of left hand. at this time he was hospitalized and received 2 weeks of antibiotics he remembers 1 of which to be IV  vancomycin. Patient improved was discharged on oral antibiotics.    Today patient went to emergency room at [facility] because of pain and persistence of swelling and erythema left middle finger. From where patient was transferred to our hospital for direct admission evaluation and management of this swelling erythema and pain left middle finger.         Past Medical History:                                                                [facility]     Past Medical History   Diagnosis Date   . Gastroesophageal reflux disease    . Diabetes mellitus    . Hyperlipidemia    . Hypertension    . TIA (transient ischemic attack)    . CAD (coronary artery disease)    . Myocardial infarction    . Hernia    . Presence of stent in coronary artery      4 STENTS   . Neuropathy      diabetic     Past Surgical History:                                                                [facility]     Past Surgical History   Procedure Laterality Date   . Coronary angioplasty with stent placement       2 stents   . Cervical fusion     . Lumbar laminectomy     . Coronary angioplasty with stent placement  12/13/12     DES to OM1, 2 stents   . Circumcision, > 13 years, adult N/A 09/08/2014     Procedure: CIRCUMCISION, > 13 YEARS, ADULT;  Surgeon: Ellwood Handler, MD;  Location: [facility];  Service: Urology;  Laterality: N/A;  CIRCUMCISION     Family History:                                                                           [facility]     Family  History   Problem Relation Age of Onset   . Heart disease Mother    . Stroke Mother    . Diabetes Mother    . Heart attack Mother    . Heart disease Father    . Stroke Father    . Heart attack Father    . Diabetes Sister      Social History:  [facility]     History     Social History   . Marital Status: Single     Spouse Name: N/A   . Number of Children: N/A   . Years of Education: N/A     Occupational History   . Not on file.     Social History Main Topics   . Smoking status: Never Smoker    . Smokeless tobacco: Never Used   . Alcohol Use: No   . Drug Use: No   . Sexual Activity: Not on file     Other Topics Concern   . Not on file     Social History Narrative     Code Status: Full Code  Allergies:                                                                                     [facility]     Allergies   Allergen Reactions   . Gabapentin    . Glucophage [Metformin Hydrochloride]    . Keflex [Cephalexin]    . Morphine      Medications:                                                                               [facility]     No current facility-administered medications on file prior to encounter.     Current Outpatient Prescriptions on File Prior to Encounter   Medication Sig Dispense Refill   . amitriptyline (ELAVIL) 100 MG tablet Take 100 mg by mouth nightly.     Marland Kitchen aspirin 81 MG chewable tablet Chew 1 tablet (81 mg total) by mouth daily. 90 tablet 0   . atorvastatin (LIPITOR) 10 MG tablet Take 4 tablets (40 mg total) by mouth nightly. (Patient taking differently: Take 40 mg by mouth daily.   ) 30 tablet 3   . fenofibrate (TRICOR) 48 MG tablet Take 2 tablets (96 mg total) by mouth daily. 30 tablet 3   . glimepiride (AMARYL) 4 MG tablet Take 8 mg by mouth every morning before breakfast.     . hydrochlorothiazide (HYDRODIURIL) 12.5 MG tablet Take 12.5 mg by mouth daily.     . hydrOXYzine (VISTARIL) 50 MG capsule  Take 1 capsule (50 mg total) by mouth nightly. 30 capsule 0   . insulin aspart (NOVOLOG) 100 UNIT/ML injection pen Inject 1-5 Units into the skin 4 times daily - with meals and at bedtime. 15 mL 0   . insulin glargine (LANTUS SOLOSTAR) 100 UNIT/ML injection pen Inject 10 Units into the skin nightly. 15 mL 0   . isosorbide mononitrate (IMDUR) 30 MG 24 hr tablet Take 30 mg by mouth daily.      Marland Kitchen METOPROLOL TARTRATE PO Take 100 mg by mouth 2 (two) times daily.     . nitroglycerin (NITROSTAT) 0.4 MG SL tablet Place 0.4  mg under the tongue every 5 (five) minutes as needed.     . prasugrel (EFFIENT) 5 MG Tab Take 2 tablets (10 mg total) by mouth daily. 30 tablet 11   . ranitidine (ZANTAC) 300 MG tablet Take 300 mg by mouth daily.     . sitaGLIPtin (JANUVIA) 100 MG tablet Take 100 mg by mouth daily.         Physical Exam:                                                                             [facility]     Patient Vitals for the past 24 hrs:   BP Temp Temp src Pulse Resp SpO2   10/22/14 2003 120/67 mmHg 98.2 F (36.8 C) Oral 100 18 96 %     Wt Readings from Last 4 Encounters:   09/08/14 82 kg (180 lb 12.4 oz)   04/12/14 89.6 kg (197 lb 8.5 oz)   09/24/13 87.68 kg (193 lb 4.8 oz)   03/18/13 93.441 kg (206 lb)     There is no weight on file to calculate BMI.  No intake or output data in the 24 hours ending 10/22/14 2225    1. Constitutional: Acutely sick looking but in no acute distress.    2. Eyes: Pupils round and reactive to light. Has no pallor or icterus.    3. ENT: Hearing is grossly normal. Moist tongue and buccal mucosa.    4. Neck: Trachea is in midline. Thyroid is not enlarged or tender.    5. Lymph: No cervical or axillary lymphadenopathy.    6. CV: S1-S2 well heard. No gallop and no friction rub appreciated. No jugular venous distention..    7. Lungs:  No accessory muscle use. Lung fields are clear to auscultation.    8. Abd: Soft, No masses. No tenderness. Bowel sound present. No  organomegaly.    9. MS: Erythematous indurated left middle finger. Deformity at flexion distal interphalangeal joint of the left middle finger. Other extremities have Normal ROM. Normal muscle tone.     10. Skin: No rash. No nodules. No clubbing.    11. CNS: Awake, alert and oriented 3. Cranial nerves grossly intact. No gross sensorimotor deficit. No lateralizing signs.    Labs and Imaging:                                                                         [facility]   RECENT LABS (from the past 7 days)    Recent Labs  Lab 10/22/14  2141   WBC 5.7   RBC 4.68   HEMOGLOBIN 13.1   HEMATOCRIT 38.9*   MCV 83   PLT CT 212       Recent Labs  Lab 10/22/14  2141   PT 11.7*   PT INR 1.1   APTT 22.4*  Invalid input(s):  AMORPHOUSUA   Lab Results   Component Value Date    HGBA1CPERCNT 11.9 04/12/2014            CT FINGER LEFT WO CONTRAST    (Results Pending)       Darra Lis, MD  [facility]  [address]  [place], [place]  [facility] Pager  -  [phone]  [facility]  -  [phone]    cc:Leola Brazil B, DO

## 2014-10-22 NOTE — Plan of Care (Signed)
Problem: Safety  Goal: Patient will be free from injury during hospitalization  Outcome: Progressing  Pt educated on using call bell for assistance, pt verbalizes understanding

## 2014-10-23 ENCOUNTER — Inpatient Hospital Stay: Payer: BC Managed Care – PPO

## 2014-10-23 LAB — COMPREHENSIVE METABOLIC PANEL
ALT: 27 U/L (ref 0–55)
AST (SGOT): 23 U/L (ref 10–42)
Albumin/Globulin Ratio: 1.17 Ratio (ref 0.70–1.50)
Albumin: 3.4 gm/dL — ABNORMAL LOW (ref 3.5–5.0)
Alkaline Phosphatase: 45 U/L (ref 40–145)
Anion Gap: 11.4 mMol/L (ref 7.0–18.0)
BUN / Creatinine Ratio: 15.9 Ratio (ref 10.0–30.0)
BUN: 13 mg/dL (ref 7–22)
Bilirubin, Total: 1.5 mg/dL — ABNORMAL HIGH (ref 0.1–1.2)
CO2: 26.4 mMol/L (ref 20.0–30.0)
Calcium: 8.8 mg/dL (ref 8.5–10.5)
Chloride: 102 mMol/L (ref 98–110)
Creatinine: 0.82 mg/dL (ref 0.80–1.30)
EGFR: >60 mL/min/{1.73_m2}
Globulin: 2.9 gm/dL (ref 2.0–4.0)
Glucose: 162 mg/dL — ABNORMAL HIGH (ref 70–99)
Osmolality Calc: 276 mOsm/kg (ref 275–300)
Potassium: 3.8 mMol/L (ref 3.5–5.3)
Protein, Total: 6.3 gm/dL (ref 6.0–8.3)
Sodium: 136 mMol/L (ref 136–147)

## 2014-10-23 LAB — VH DEXTROSE STICK GLUCOSE
Glucose POCT: 266 mg/dL — ABNORMAL HIGH (ref 70–99)
Glucose POCT: 151 mg/dL — ABNORMAL HIGH (ref 70–99)
Glucose POCT: 209 mg/dL — ABNORMAL HIGH (ref 70–99)
Glucose POCT: 163 mg/dL — ABNORMAL HIGH (ref 70–99)
Glucose POCT: 146 mg/dL — ABNORMAL HIGH (ref 70–99)

## 2014-10-23 LAB — C-REACTIVE PROTEIN HIGH SENSITIVE: C-Reactive Protein, High Sensitive: 0.15 mg/L (ref 0.00–3.00)

## 2014-10-23 LAB — SEDIMENTATION RATE: Sed Rate: 2 mm/hr (ref 0–20)

## 2014-10-23 MED ORDER — VH VANCOMYCIN 1250 MG IN NS 250 ML (SIMPLE)
1250.0000 mg | Freq: Two times a day (BID) | Status: DC
Start: 2014-10-23 — End: 2014-10-26
  Administered 2014-10-23 – 2014-10-26 (×7): 1250 mg via INTRAVENOUS
  Filled 2014-10-23 (×9): qty 250

## 2014-10-23 MED ORDER — VANCOMYCIN HCL IN DEXTROSE 1-5 GM/200ML-% IV SOLN
1000.0000 mg | Freq: Two times a day (BID) | INTRAVENOUS | Status: DC
Start: 2014-10-23 — End: 2014-10-23

## 2014-10-23 MED ORDER — VH VANCOMYCIN THERAPY PLACEHOLDER
Status: DC
Start: 2014-10-23 — End: 2014-10-26

## 2014-10-23 NOTE — Plan of Care (Signed)
Problem: Pain  Goal: Patient's pain/discomfort is manageable  Outcome: Progressing  Pain managed with PRN Percocet

## 2014-10-23 NOTE — UM Notes (Addendum)
59 y.o. male who presents to the [facility]. As a transfer from [facility] for evaluation and management of left medial finger swelling and erythema failed outpatient oral antibiotic.Patient was in his usual state of health until 5 weeks back at which time he started to experience painful erythematosus swelling at the distal interphalangeal joint of his left middle finger. He squeezed it and a thick yellowish material coming out of it. He went to his primary care provider and he was started on oral antibiotics tablet to be taken twice a day.4 days after the oral antibiotics patient went to the emergency room because of worsening swelling and erythema left middle finger this time extending to dorsum of left hand. at this time he was hospitalized and received 2 weeks of antibiotics he remembers 1 of which to be IV vancomycin. Patient improved was discharged on oral antibiotics.    Today patient went to emergency room at [facility] because of pain and persistence of swelling and erythema left middle finger. From there patient was transferred to our hospital for direct admission evaluation and management of this swelling erythema and pain left middle finger.    PMH- GERD, DM, HTN, TIA, CAD, MI, hernia, diabetic neuropathy    PSH- coronary angioplasty with stent placement, cervical fusion, lumbar laminectomy    MS: Erythematous indurated left middle finger. Deformity at flexion distal interphalangeal joint of the left middle finger.     Assessment and plan  Cellulitis rule out osteomyelitis left middle finger. Failed outpatient oral antibiotic treatment. Patient is admitted as a direct admission. Has recent culture report of MRSA. Patient will be on IV vancomycin. We will do CT scan of left middle finger to rule out osteomyelitis. Orthopedic consultation will be requested.    98.2-100-96%-18-120/67    lovenox  Insulin sc  vanco iv q12h  Percocet prn x 1 7/6 x 2 7/7  Contact isolation    BS 296      CT  finger on 7/7  IMPRESSION:   Diffuse soft tissue thickening involving the left long (third) finger, with erosions of the distal phalanx and severe  cartilage loss and central erosions at the third DIP joint. While nonspecific, the findings are concerning for septic  arthritis in this setting.    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     Plan: Dr. Manson Passey feels that patient should continue on IV antibiotics. I have discussed with patient the strong possibility of amputation. Hopefully antibiotics will get infection under control and preserve length.

## 2014-10-23 NOTE — Progress Notes (Signed)
Pharmacy Vancomycin Dosing Consult Note  Michael [name]    Assessment:   1. Day one vanco for cellulitis L middle finger Hx MRSA  2. Est crclr 63.53ml/min     Plan:   1. vanco 1g IV q12h, trough goal 15 due 7/8 2300  2. Pharmacy will follow the patient's renal function, vancomycin dosing and levels     Indication: cellulitis L middle finger  Hx    CAD, HTN, DM    Age: 59 y.o.  Weight: 82kg  IBW: 63.8 kg  DW: <29% over    Tmax 98.2    SCr 1.14    BUN 14    WBC 5.7    Hgb 13.1    Hct 38.9    Renal meds:  Enoxaparin, famotidine, fenofibrate, HCTZ, sitagliptin, vancomycin     Pharmacokinetics:   t50: 12.2 hrs         Kel: 0.057 hr-1        Vd: 57.4 L         Expected Trough: 15 mg/L                Recent Labs  Lab 10/22/14  2141   CREATININE 1.14   BUN 14       Recent Labs  Lab 10/22/14  2141   WBC 5.7     Temp (24hrs), Avg:98 F (36.7 C), Min:97.8 F (36.6 C), Max:98.2 F (36.8 C)      Cultures:   Date Source Organism Sensitivities Resistance

## 2014-10-23 NOTE — Progress Notes (Addendum)
PROGRESS NOTE - [facility]    Date Time: 10/23/2014 9:32 PM  Patient Name: Michael [name]  Attending Physician: Darra Lis, MD    Assessment and Plan:                                                                                       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 Failed outpatient oral antibiotic treatment. We will continue on IV vancomycin. Infectious disease and Orthopedic consultation  requested.    Type 2 diabetes mellitus. Patient will be on his home dose medications for type 2 diabetes mellitus. He would also be on Accu-Chek before meals and at bedtime with corrective NovoLog coverage.    Hypertension. Continue home medications with parameters.    Coronary artery disease. Stable at present time. Continue home medications.      Lines:                                                                                                  [facility]     Patient Lines/Drains/Airways Status    Active PICC Line / CVC Line / PIV Line / Drain / Airway / Intraosseous Line / Epidural Line / ART Line / Line / Wound / Pressure Ulcer / NG/OG Tube     Name:   Placement date:   Placement time:   Site:   Days:    Peripheral IV 10/23/14 Right Forearm  10/23/14   1513   Forearm   less than 1                Disposition:                                                                                        [facility]     Today's date: 10/23/2014  Length of Stay: 1    Anticipated medical stability for discharge in : 2 days    Subjective  [facility]     CC: Cellulitis of middle finger, left        Review of Systems:                                                                            [facility]     Constitutional: Negative for fever and chills.     HEENT: Negative for nosebleeds and sore throat.      Respiratory: Negative for cough, sputum production, shortness of breath or wheezing.      Cardiovascular: No dyspnea, orthopnea or paroxysmal nocturnal dyspnea.     Gastrointestinal: Negative for abdominal pain, nausea, vomiting or diarrhea.     Genitourinary: Negative for dysuria.     Musculoskeletal: painful erythematous swelling left middle finger..     Skin: Negative for rash.     Neurological: Negative for dizziness or headache.  No complaint of focal weakness or numbness    Physical Exam:                                                                                   [facility]   Temp:  [97.5 F (36.4 C)-97.9 F (36.6 C)] 97.9 F (36.6 C)  Heart Rate:  [66-98] 72  Resp Rate:  [16-18] 18  BP: (101-123)/(55-71) 109/62 mmHg    Intake/Output Summary (Last 24 hours) at 10/23/14 2132  Last data filed at 10/23/14 1411   Gross per 24 hour   Intake   1400 ml   Output      0 ml   Net   1400 ml       General: awake, alert, oriented x 3; in no acute distress.    HEENT: Pupils are equal and reactive, no pallor, no icterus, moist tongue and buccal mucosa. Neck is supple, trachea is midline, no lymphadenopathy.    Cardiovascular: S1 and S2 well heard. No gallop and no friction rub. No jugular venous distention.    Lungs:  No use of accessory muscles of respiration. Lung fields are clear to auscultation bilaterally.    Abdomen: soft, non-tender, non-distended; no palpable mass, no hepatosplenomegaly, bowel sounds well heard.    Extremities: Erythematous indurated left middle finger. Deformity at flexion distal interphalangeal joint of the left middle finger. Other extremities have Normal ROM. Normal muscle tone.     Neuro: Awake alert oriented 3. Cranial nerves grossly intact. No focal weakness or numbness. No gross lateralizing signs.    Skin: erythema left middle  finger.    Meds:  [facility]     Medications were reviewed in the electronic record: [x]     Labs and Imaging:                                                                             [facility]     Results     Procedure Component Value Units Date/Time    Dextrose Stick Glucose [[id number]]  (Abnormal) Collected:  10/23/14 2058    Specimen Information:  Blood Updated:  10/23/14 2115     Glucose, POCT 146 (H) mg/dL     Dextrose Stick Glucose [[id number]]  (Abnormal) Collected:  10/23/14 1657    Specimen Information:  Blood Updated:  10/23/14 1719     Glucose, POCT 163 (H) mg/dL     Dextrose Stick Glucose [[id number]]  (Abnormal) Collected:  10/23/14 1138    Specimen Information:  Blood Updated:  10/23/14 1239     Glucose, POCT 209 (H) mg/dL     CRP, High Sensitive [[id number]] Collected:  10/23/14 0733    Specimen Information:  Plasma Updated:  10/23/14 0834     C-Reactive Protein, High Sensitive 0.15 mg/L     Comprehensive metabolic panel [[id number]]  (Abnormal) Collected:  10/23/14 0733    Specimen Information:  Plasma Updated:  10/23/14 0834     Sodium 136 mMol/L      Potassium 3.8 mMol/L      Chloride 102 mMol/L      CO2 26.4 mMol/L      Calcium 8.8 mg/dL      Glucose 244 (H) mg/dL      Creatinine 0.10 mg/dL      BUN 13 mg/dL      Protein, Total 6.3 gm/dL      Albumin 3.4 (L) gm/dL      Alkaline Phosphatase 45 U/L      ALT 27 U/L      AST (SGOT) 23 U/L      Bilirubin, Total 1.5 (H) mg/dL      Albumin/Globulin Ratio 1.17 Ratio      Anion Gap 11.4 mMol/L      BUN/Creatinine Ratio 15.9 Ratio      EGFR >60 mL/min/1.12m2      Osmolality Calc 276 mOsm/kg      Globulin 2.9 gm/dL     Dextrose Stick Glucose [[id number]]  (Abnormal) Collected:  10/23/14 0752    Specimen Information:  Blood Updated:  10/23/14 0825     Glucose, POCT 151 (H) mg/dL     Sedimentation rate (ESR) [[id number]] Collected:  10/23/14 0733     Specimen Information:  Blood Updated:  10/23/14 0818     Sed Rate 2 mm/hr     Dextrose Stick Glucose [[id number]]  (Abnormal) Collected:  10/23/14 0056    Specimen Information:  Blood Updated:  10/23/14 0113     Glucose, POCT 266 (H) mg/dL     Basic Metabolic Panel [[id number]]  (Abnormal) Collected:  10/22/14 2141    Specimen Information:  Plasma Updated:  10/22/14 2226     Sodium 138 mMol/L      Potassium 4.3 mMol/L      Chloride 101 mMol/L      CO2 27.2 mMol/L  Calcium 9.2 mg/dL      Glucose 161 (H) mg/dL      Creatinine 0.96 mg/dL      BUN 14 mg/dL      Anion Gap 04.5 mMol/L      BUN/Creatinine Ratio 12.3 Ratio      EGFR >60 mL/min/1.55m2      Osmolality Calc 287 mOsm/kg     Magnesium [[id number]] Collected:  10/22/14 2141    Specimen Information:  Plasma Updated:  10/22/14 2226     Magnesium 1.9 mg/dL     Phosphorus [[id number]] Collected:  10/22/14 2141    Specimen Information:  Plasma Updated:  10/22/14 2226     Phosphorus 3.4 mg/dL     Uric Acid [[id number]] Collected:  10/22/14 2141    Specimen Information:  Plasma Updated:  10/22/14 2226     Uric acid 4.5 mg/dL     PT/APTT [[id number]]  (Abnormal) Collected:  10/22/14 2141    Specimen Information:  Blood Updated:  10/22/14 2211     PT 11.7 (H) sec      PT INR 1.1      aPTT 22.4 (L) sec     CBC with differential [[id number]]  (Abnormal) Collected:  10/22/14 2141    Specimen Information:  Blood from Blood Updated:  10/22/14 2200     WBC 5.7 K/cmm      RBC 4.68 M/cmm      Hemoglobin 13.1 gm/dL      Hematocrit 53.6 (L) %      MCV 83 fL      MCH 28 pg      MCHC 34 gm/dL      RDW 64.4 %      PLT CT 212 K/cmm      MPV 6.9 fL      NEUTROPHIL % 45.8 %      Lymphocytes 41.9 %      Monocytes 7.5 %      Eosinophils % 4.3 %      Basophils % 0.5 %      Neutrophils Absolute 2.6 K/cmm      Lymphocytes Absolute 2.4 K/cmm      Monocytes Absolute 0.4 K/cmm      Eosinophils Absolute 0.2 K/cmm      BASO Absolute 0.0 K/cmm           Microbiology, reviewed and are significant  for:  Microbiology Results     None          Imaging, reviewed and are significant for:  Radiology Results (24 Hour)     Procedure Component Value Units Date/Time    CT FINGER LEFT WO CONTRAST [[id number]] Collected:  10/23/14 1056    Order Status:  Completed Updated:  10/23/14 1105    Narrative:      Clinical History:  59 year old with cellulitis of finger. Culture positive for MRSA. Pain and swelling. Assess for osteomyelitis long  finger.    Examination:  CT left upper extremity without contrast October 23, 2014.    Technique:  Noncontrast CT of the left upper extremity was performed with contiguous axial images through the third (long) finger.   Automated exposure control techniques were used to obtain CT dose reduction and diagnostic images.    Comparison:  Correlation with radiographs October 23, 2014.    Findings:  There is significant soft tissue thickening circumferentially involving the long finger, from the tip to the level of  the PIP joint. Assessment for focal collections is somewhat limited on  this noncontrast exam. There is no internal gas,  and no radiopaque foreign body. There is erosion of the dorsal cortex at the base of the distal phalanx. There is also  irregular periostitis along the volar cortex of the shaft of the distal phalanx. There is severe cartilage loss at the  distal interphalangeal joint, with complete joint space narrowing and bone-on-bone contact. There are central erosions,  largest at the ulnar aspect, cortical irregularity, and marginal osteophytes. The extensor mechanism and the flexor  tendon appear intact.    Limited images of the second and fourth digits show preservation of joint spaces at the MCP and interphalangeal joints,  without additional erosions or bone destruction.      Impression:      Diffuse soft tissue thickening involving the left long (third) finger, with erosions of the distal phalanx and severe  cartilage loss and central erosions at the third DIP joint. While  nonspecific, the findings are concerning for septic  arthritis in this setting.       ReadingStation:[email or web address]    XR HAND LEFT PA LATERAL AND OBLIQUE [[id number]] Collected:  10/23/14 1040    Order Status:  Completed Updated:  10/23/14 1056    Narrative:      Clinical History:  Pain and edema third digit. No history of trauma or injury. Evaluate for osteomyelitis.    Examination:  AP, lateral and oblique views of the left hand.    Comparison:  None available.    Findings:  There is loss of joint space involving the third DIP. There are associated bone erosions and bony fragmentation.  Findings suggest septic joint with associated osteomyelitis. Other bones of the hand appear unremarkable.      Impression:      Findings suggest septic third DIP joint with associated osteomyelitis.    ReadingStation:[email or web address]          I have spent 40 minutes with the patient, discussing , of which greater than 50% of the time was spent counseling and coordinating care.    Signed by: Darra Lis, MD    [facility]  [address]  [place], [id number]

## 2014-10-23 NOTE — Progress Notes (Signed)
Pharmacy Vancomycin Dosing Consult Note  Michael [name]    Assessment:   1. Day one vanco for cellulitis of  L middle finger; Hx MRSA; DM;CAD. Failed outpatient abx treatment.  SCr improved this AM; calculated CRCL of 87.5 ml/min. Wound is concerning for osteomyelitis.  Xray findings suggest septic joint with associated osteomyelitis; CT scan pending.     Plan:   1. Increase vanco to 1250 mg IV q12h.  2. Vanco trough goal 15-20 if osteomyelitis;  due 7/8 2300  3. Pharmacy will follow the patient's renal function, vancomycin dosing and levels. Any questions please call [phone].     Indication: cellulitis/ possible osteomyelitis of L middle finger  Hx    CAD, HTN, DM    Age: 59 y.o.  Weight: 82kg  IBW: 63.8 kg  DW: <29%     Pharmacokinetics:   t50: 9 hrs         Kel: 0.077 hr-1        Vd: 57.4 L         Expected Trough: 14 mg/L                Recent Labs  Lab 10/23/14  0733 10/22/14  2141   CREATININE 0.82 1.14   BUN 13 14       Recent Labs  Lab 10/22/14  2141   WBC 5.7     Temp (24hrs), Avg:97.9 F (36.6 C), Min:97.6 F (36.4 C), Max:98.2 F (36.8 C)      Cultures:   Date Source Organism Sensitivities Resistance

## 2014-10-23 NOTE — Plan of Care (Signed)
Problem: Safety  Goal: Patient will be free from injury during hospitalization  Outcome: Progressing    Problem: Pain  Goal: Patient's pain/discomfort is manageable  Outcome: Progressing    Problem: Psychosocial and Spiritual Needs  Goal: Demonstrates ability to cope with hospitalization/illness  Outcome: Progressing

## 2014-10-23 NOTE — Consults (Signed)
INFECTIOUS DISEASE CONSULT NOTE    Date Time: 10/23/2014 7:02 PM  Patient Name: Michael [name]  Attending Physician: Darra Lis, MD    Reason for Consult: MRSA infection DIPJ long finger    Subjective     CC: <principal problem not specified>  Active Problems:    CAD (coronary artery disease)    Cellulitis of finger    Type 2 diabetes mellitus    Hypertension    Cellulitis of middle finger, left      HPI/Subjective:   59 year old male who developed swelling along finger left hand. He is right hand dominant. He doesn't recall any trauma. He noted his entire finger was swollen. He stated at one point he progressed on the tissue below the nail bed and purulent material was expressed. This apparently culture positive for MRSA. He said there was an attempt at local drainage with no fluid obtained. He says he was in the hospital for 2 weeks on IV medication and then was sent home on Bactrim. Some of the swelling in the proximal portion of the finger improved but the stool portion remained swollen and red he is not having fever chills or sweats    Review of Systems:   Patient denies significant pain in the left axilla or elbow. He had no swollen glands in this area. No evidence of lymphangitic streaking or redness up his left arm. He is currently without significant pain in his left hand. He denies nausea vomiting diarrhea. No change in urinary habits. No swelling of other joints. He does not have a headache; no problems with swallowing or swollen glands in his neck remainder review of systems is negative. No chest pain or shortness of breath    Past Medical History:     Past Medical History   Diagnosis Date   . Gastroesophageal reflux disease    . Diabetes mellitus    . Hyperlipidemia    . Hypertension    . TIA (transient ischemic attack)    . CAD (coronary artery disease)    . Myocardial infarction    . Hernia    . Presence of stent in coronary artery      4 STENTS   . Neuropathy      diabetic   .  Cellulitis of finger of left hand 10/22/14       Past Surgical History:     Past Surgical History   Procedure Laterality Date   . Coronary angioplasty with stent placement       2 stents   . Cervical fusion     . Lumbar laminectomy     . Coronary angioplasty with stent placement  12/13/12     DES to OM1, 2 stents   . Circumcision, > 13 years, adult N/A 09/08/2014     Procedure: CIRCUMCISION, > 13 YEARS, ADULT;  Surgeon: Ellwood Handler, MD;  Location: [facility];  Service: Urology;  Laterality: N/A;  CIRCUMCISION         Allergies:     Allergies   Allergen Reactions   . Gabapentin    . Glucophage [Metformin Hydrochloride]    . Keflex [Cephalexin]    . Morphine        Social History:     History     Social History   . Marital Status: Single     Spouse Name: N/A   . Number of Children: N/A   . Years of Education: N/A     Occupational History   .  Not on file.     Social History Main Topics   . Smoking status: Never Smoker    . Smokeless tobacco: Never Used   . Alcohol Use: No   . Drug Use: No   . Sexual Activity: Not on file     Other Topics Concern   . Not on file     Social History Narrative           Family History:     Family History   Problem Relation Age of Onset   . Heart disease Mother    . Stroke Mother    . Diabetes Mother    . Heart attack Mother    . Heart disease Father    . Stroke Father    . Heart attack Father    . Diabetes Sister        Physical Exam:   Temp:  [97.5 F (36.4 C)-98.2 F (36.8 C)] 97.5 F (36.4 C)  Heart Rate:  [66-100] 66  Resp Rate:  [16-18] 16  BP: (101-123)/(55-71) 101/55 mmHg    Wt Readings from Last 3 Encounters:   10/23/14 90.266 kg (199 lb)   09/08/14 82 kg (180 lb 12.4 oz)   04/12/14 89.6 kg (197 lb 8.5 oz)       Intake/Output Summary (Last 24 hours) at 10/23/14 1902  Last data filed at 10/23/14 1411   Gross per 24 hour   Intake   1400 ml   Output      0 ml   Net   1400 ml       Patient is alert oriented and afebrile. HEENT appears normal for age. His conjunctiva  are clear. Cranial nerves II through XII grossly intact. Multiple teeth absent. No oral thrush or ulcers. Oral mucosa moist. Neck is supple without adenopathy. No JVD. Lungs are clear no wheezing or rhonchi. Cardiac exam reveals a rhythm that is regular at 66 there is no murmur rub or S3. His abdomen is slightly adipose but soft nontender no guarding or rebound internal structures not palpable liver span approximately 10 cm. The knees no cyanosis clubbing edema or synovitis in his legs. Right arm appears normal. Left axilla and no enlarged tender lymph nodes left elbow intact range of motion without enlarged lymph nodes left hand shows erythema and swelling flexure contracture of the DIPJ long finger of this finger is erythematous slightly warm there is areas on the MCP J region with the skin shows signs of having scaled consistent with edema there is no edema in the proximal portion of the phalanx with the PIPJ. No obvious ulcerations. No expressible pus around the nailbed    Meds:     Current Facility-Administered Medications   Medication Dose Route Frequency   . amitriptyline  100 mg Oral QHS   . aspirin  81 mg Oral Daily   . atorvastatin  40 mg Oral QHS   . enoxaparin  40 mg Subcutaneous QHS   . famotidine  20 mg Oral BID   . fenofibrate  54 mg Oral Daily   . glimepiride  8 mg Oral QAM AC   . hydrochlorothiazide  12.5 mg Oral Daily   . hydrOXYzine  50 mg Oral QHS   . insulin glargine  10 Units Subcutaneous QHS   . isosorbide mononitrate  30 mg Oral Daily   . metoprolol  100 mg Oral BID   . prasugrel  10 mg Oral Daily   . sitaGLIPtin  100 mg Oral  Daily   . sodium chloride (PF)  3 mL Intravenous Q8H   . vancomycin  1,250 mg Intravenous Q12H   . vancomycin therapy placeholder   Does not apply See Admin Instructions     acetaminophen **OR** acetaminophen, dextrose, insulin aspart, insulin aspart, naloxone, nitroglycerin, ondansetron, oxyCODONE-acetaminophen           Labs:     Labs (last 24 hours):  Results      Procedure Component Value Units Date/Time    Dextrose Stick Glucose [[id number]]  (Abnormal) Collected:  10/23/14 1657    Specimen Information:  Blood Updated:  10/23/14 1719     Glucose, POCT 163 (H) mg/dL     Dextrose Stick Glucose [[id number]]  (Abnormal) Collected:  10/23/14 1138    Specimen Information:  Blood Updated:  10/23/14 1239     Glucose, POCT 209 (H) mg/dL     CRP, High Sensitive [[id number]] Collected:  10/23/14 0733    Specimen Information:  Plasma Updated:  10/23/14 0834     C-Reactive Protein, High Sensitive 0.15 mg/L     Comprehensive metabolic panel [[id number]]  (Abnormal) Collected:  10/23/14 0733    Specimen Information:  Plasma Updated:  10/23/14 0834     Sodium 136 mMol/L      Potassium 3.8 mMol/L      Chloride 102 mMol/L      CO2 26.4 mMol/L      Calcium 8.8 mg/dL      Glucose 629 (H) mg/dL      Creatinine 5.28 mg/dL      BUN 13 mg/dL      Protein, Total 6.3 gm/dL      Albumin 3.4 (L) gm/dL      Alkaline Phosphatase 45 U/L      ALT 27 U/L      AST (SGOT) 23 U/L      Bilirubin, Total 1.5 (H) mg/dL      Albumin/Globulin Ratio 1.17 Ratio      Anion Gap 11.4 mMol/L      BUN/Creatinine Ratio 15.9 Ratio      EGFR >60 mL/min/1.17m2      Osmolality Calc 276 mOsm/kg      Globulin 2.9 gm/dL     Dextrose Stick Glucose [[id number]]  (Abnormal) Collected:  10/23/14 0752    Specimen Information:  Blood Updated:  10/23/14 0825     Glucose, POCT 151 (H) mg/dL     Sedimentation rate (ESR) [[id number]] Collected:  10/23/14 0733    Specimen Information:  Blood Updated:  10/23/14 0818     Sed Rate 2 mm/hr     Dextrose Stick Glucose [[id number]]  (Abnormal) Collected:  10/23/14 0056    Specimen Information:  Blood Updated:  10/23/14 0113     Glucose, POCT 266 (H) mg/dL     Basic Metabolic Panel [[id number]]  (Abnormal) Collected:  10/22/14 2141    Specimen Information:  Plasma Updated:  10/22/14 2226     Sodium 138 mMol/L      Potassium 4.3 mMol/L      Chloride 101 mMol/L      CO2 27.2 mMol/L      Calcium 9.2 mg/dL       Glucose 295 (H) mg/dL      Creatinine 1.88 mg/dL      BUN 14 mg/dL      Anion Gap 41.6 mMol/L      BUN/Creatinine Ratio 12.3 Ratio      EGFR >60 mL/min/1.33m2      Osmolality Calc 287  mOsm/kg     Magnesium [[id number]] Collected:  10/22/14 2141    Specimen Information:  Plasma Updated:  10/22/14 2226     Magnesium 1.9 mg/dL     Phosphorus [[id number]] Collected:  10/22/14 2141    Specimen Information:  Plasma Updated:  10/22/14 2226     Phosphorus 3.4 mg/dL     Uric Acid [[id number]] Collected:  10/22/14 2141    Specimen Information:  Plasma Updated:  10/22/14 2226     Uric acid 4.5 mg/dL     PT/APTT [[id number]]  (Abnormal) Collected:  10/22/14 2141    Specimen Information:  Blood Updated:  10/22/14 2211     PT 11.7 (H) sec      PT INR 1.1      aPTT 22.4 (L) sec     CBC with differential [[id number]]  (Abnormal) Collected:  10/22/14 2141    Specimen Information:  Blood from Blood Updated:  10/22/14 2200     WBC 5.7 K/cmm      RBC 4.68 M/cmm      Hemoglobin 13.1 gm/dL      Hematocrit 25.3 (L) %      MCV 83 fL      MCH 28 pg      MCHC 34 gm/dL      RDW 66.4 %      PLT CT 212 K/cmm      MPV 6.9 fL      NEUTROPHIL % 45.8 %      Lymphocytes 41.9 %      Monocytes 7.5 %      Eosinophils % 4.3 %      Basophils % 0.5 %      Neutrophils Absolute 2.6 K/cmm      Lymphocytes Absolute 2.4 K/cmm      Monocytes Absolute 0.4 K/cmm      Eosinophils Absolute 0.2 K/cmm      BASO Absolute 0.0 K/cmm     Dextrose Stick Glucose [[id number]]  (Abnormal) Collected:  10/22/14 2108    Specimen Information:  Blood Updated:  10/22/14 2128     Glucose, POCT 296 (H) mg/dL           Imaging, reviewed and are significant for:  CT FINGER LEFT WO CONTRAST   Final Result   Diffuse soft tissue thickening involving the left long (third) finger, with erosions of the distal phalanx and severe   cartilage loss and central erosions at the third DIP joint. While nonspecific, the findings are concerning for septic   arthritis in this setting.           ReadingStation:[email or web address]      XR HAND LEFT PA LATERAL AND OBLIQUE   Final Result   Findings suggest septic third DIP joint with associated osteomyelitis.      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 MRSA causative agent      Plan:   PICC line  Anticipate surgical incision and drainage  Vancomycin plan for 4-6 week course    Signed by: Rosiland Oz, MD

## 2014-10-23 NOTE — Consults (Signed)
Asked to see patient for pain swelling long finger left hand.  Patient is a very pleasant 59 y/o male.  This started almost six weeks ago. No history of injury.Had some purulent drainage from cuticle at that time.  Was treated with IV antibiotics for two weeks and then switched to oral meds.  Initially had improvement however three days ago had increase in swelling, redness and pain.    Past Medical history, review of systems and medications are well documented in chart and are non contributory to his present condition.    PE: Mallet finger deformity, redness of DIP joint with swelling to mid phalanx.    X--rays are reviewed with Dr. Manson Passey. : Total dystruction of DIP joint with erosion of bone on each side of DIP joint.    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     Plan: Dr. Manson Passey feels that patient should continue on IV antibiotics. I have discussed with patient the strong possibility of amputation. Hopefully antibiotics will get infection under control and preserve length.    Will follow with you.    Thank you for allowing [place] to share in the care of this patient.

## 2014-10-24 ENCOUNTER — Encounter
Admission: RE | Disposition: A | Discharge status: Home Health | Payer: Self-pay | Source: Other Acute Inpatient Hospital | Attending: Internal Medicine

## 2014-10-24 LAB — VH DEXTROSE STICK GLUCOSE
Glucose POCT: 188 mg/dL — ABNORMAL HIGH (ref 70–99)
Glucose POCT: 211 mg/dL — ABNORMAL HIGH (ref 70–99)
Glucose POCT: 231 mg/dL — ABNORMAL HIGH (ref 70–99)
Glucose POCT: 125 mg/dL — ABNORMAL HIGH (ref 70–99)

## 2014-10-24 LAB — VANCOMYCIN, TROUGH: Vancomycin Trough: 10.68 ug/mL (ref 10.00–20.00)

## 2014-10-24 SURGERY — PICC LINE PLACEMENT SINGLE LUMEN

## 2014-10-24 MED ORDER — SODIUM CHLORIDE 0.9 % IJ SOLN
10.0000 mL | Freq: Every day | INTRAMUSCULAR | Status: DC
Start: 2014-10-24 — End: 2014-10-26
  Administered 2014-10-24 – 2014-10-26 (×4): 10 mL

## 2014-10-24 MED ORDER — SODIUM CHLORIDE 0.9 % IJ SOLN
10.0000 mL | Freq: Every day | INTRAMUSCULAR | Status: DC
Start: 2014-10-24 — End: 2014-10-26
  Administered 2014-10-24 – 2014-10-25 (×3): 10 mL

## 2014-10-24 MED ORDER — HEPARIN SODIUM LOCK FLUSH 10 UNIT/ML IV SOLN
3.0000 mL | Freq: Every day | INTRAVENOUS | Status: DC
Start: 2014-10-24 — End: 2014-10-26
  Administered 2014-10-24 – 2014-10-26 (×3): 5 mL
  Filled 2014-10-24 (×3): qty 5

## 2014-10-24 MED ORDER — HEPARIN LOCK FLUSH 100 UNIT/ML IV SOLN
INTRAVENOUS | Status: AC
Start: 2014-10-24 — End: 2014-10-24
  Filled 2014-10-24: qty 5

## 2014-10-24 MED ORDER — SODIUM CHLORIDE 0.9 % IJ SOLN
10.0000 mL | INTRAMUSCULAR | Status: DC | PRN
Start: 2014-10-24 — End: 2014-10-26

## 2014-10-24 NOTE — UM Notes (Signed)
[facility] Utilization Management Review Sheet    NAME: Michael [name]  MR#: [id number]    CSN#: [id number]    ROOM: 418/418-A AGE: 59 y.o.    ADMIT DATE AND TIME: 10/22/2014  7:30 PM      PATIENT CLASS:  Inpatient    ATTENDING PHYSICIAN: Darra Lis, MD  PAYOR:Payor: OUT OF STATE BLUE CROSS / Plan: BCBS OUT OF STATE / Product Type: *No Product type* /       AUTH #:   ID #:  [id number]    DIAGNOSIS:  Principal Problem:    Cellulitis of middle finger, left  Active Problems:    CAD (coronary artery disease)    Cellulitis of finger    Type 2 diabetes mellitus    Hypertension        ICD-10-CM    1. Infection B99.9 PICC Line Placement Single Lumen     PICC Line Placement Single Lumen       HISTORY:   Past Medical History   Diagnosis Date   . Gastroesophageal reflux disease    . Diabetes mellitus    . Hyperlipidemia    . Hypertension    . TIA (transient ischemic attack)    . CAD (coronary artery disease)    . Myocardial infarction    . Hernia    . Presence of stent in coronary artery      4 STENTS   . Neuropathy      diabetic   . Cellulitis of finger of left hand 10/22/14       DATE OF REVIEW: 10/24/2014    VITALS: BP 107/61 mmHg  Pulse 78  Temp(Src) 97.7 F (36.5 C) (Oral)  Resp 19  Ht 1.702 m (5\' 7" )  Wt 90.266 kg (199 lb)  BMI 31.16 kg/m2  SpO2 100%    10/24/14: Glucose range 146-298 on 7/7.  FBS today 188. CT Hand:   Diffuse soft tissue thickening of the left third finger with erosions of the distal phalanx and sever cartilage loss and central erosions at the third DIP joint.  Findings concerning for septic arthritis.  XR left hand: findings suggest septic third DIP joint with associated osteomyelitis.   ID consult on 10/23/14. Left hand shows erythema and swelling.  Flexure contracture of the DIPJ long finger. This finger is erythematous and warm in area of the MCPJ.  Patient treated with IV abx for two weeks initially then po Bactrim. Sx have not resolved.    Plan:  Place PICC, anticipate surgical I&D,  Vancomycin IV for 4-6 weeks.    PICC placed 10/24/14. Vancomycin IV q 12 hr, SSI.

## 2014-10-24 NOTE — Plan of Care (Signed)
Problem: Pain  Goal: Patient's pain/discomfort is manageable  Pt pain controlled with po pain meds

## 2014-10-24 NOTE — Progress Notes (Signed)
Pharmacy Vancomycin Dosing Consult Note  Michael [name]    Assessment:   1. Day 2 vanco for cellulitis of  L middle finger; Hx MRSA; DM;CAD. Failed outpatient abx treatment. Wound is concerning for osteomyelitis.  Xray findings suggest septic joint with associated osteomyelitis; CT scan findings are concerning for septic arthritis. No labs for today. Afebrile.     Plan:   1. Continue vanco to 1250 mg IV q12h.  2. Vanco trough goal 15-20 if osteomyelitis;  Due tonight, 7/8  @2300   3. Pharmacy will follow the patient's renal function, vancomycin dosing and levels. Any questions please call [phone].     Indication: cellulitis/ possible osteomyelitis of L middle finger  Hx    CAD, HTN, DM    Age: 59 y.o.  Weight: 82kg  IBW: 63.8 kg  DW: <29%     Pharmacokinetics:   t50: 9 hrs         Kel: 0.077 hr-1        Vd: 57.4 L         Expected Trough: 14 mg/L                Recent Labs  Lab 10/23/14  0733 10/22/14  2141   CREATININE 0.82 1.14   BUN 13 14       Recent Labs  Lab 10/22/14  2141   WBC 5.7     Temp (24hrs), Avg:97.6 F (36.4 C), Min:97.3 F (36.3 C), Max:97.9 F (36.6 C)      Cultures:   Date Source Organism Sensitivities Resistance

## 2014-10-24 NOTE — Progress Notes (Addendum)
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 Orthopedic consultant Dr. Manson Passey feels that patient should continue on IV antibiotics. Appreciate Dr. Theora Gianotti help  Noted Dr. Norm Parcel recommendation. Patient will be on IV vancomycin 1250 mg every 12 hours for 28 days starting date 10/24/2014. Appreciate Dr. Norm Parcel help.  Infusion co can deliver medications to pts home this Wynelle Link and [facility] can begin the IV antibiotic  on Sunday PM.      Type 2 diabetes mellitus. Patient will be on his home dose medications for type 2 diabetes mellitus. He would also be on Accu-Chek before meals and at bedtime with corrective NovoLog coverage.    Hypertension. Continue home medications with parameters.    Coronary artery disease. Stable at present time. Continue home medications.      Lines:                                                                                                  [facility]     Patient Lines/Drains/Airways Status    Active PICC Line / CVC Line / PIV Line / Drain / Airway / Intraosseous Line / Epidural Line / ART Line / Line / Wound / Pressure Ulcer / NG/OG Tube     Name:   Placement date:   Placement time:   Site:   Days:    Peripheral IV 10/23/14 Right Forearm  10/23/14   1513   Forearm   less than 1                Disposition:                                                                                        [facility]     Today's date: 10/24/2014  Length  of Stay: 2    Anticipated medical  stability for discharge in : 2 days    Subjective                                                                                          [facility]     CC: Cellulitis of middle finger, left        Review of Systems:                                                                           [facility]     Constitutional: Negative for fever and chills.     HEENT: Negative for nosebleeds and sore throat.      Respiratory: Negative for cough, sputum production, shortness of breath or wheezing.      Cardiovascular: No dyspnea, orthopnea or paroxysmal nocturnal dyspnea.     Gastrointestinal: Negative for abdominal pain, nausea, vomiting or diarrhea.     Genitourinary: Negative for dysuria.     Musculoskeletal: painful erythematous swelling left middle finger..     Skin: Negative for rash.     Neurological: Negative for dizziness or headache.  No complaint of focal weakness or numbness    Physical Exam:                                                                                   [facility]   Temp:  [97.3 F (36.3 C)-98.1 F (36.7 C)] 98.1 F (36.7 C)  Heart Rate:  [73-79] 79  Resp Rate:  [18-19] 18  BP: (107-122)/(61-73) 122/63 mmHg    Intake/Output Summary (Last 24 hours) at 10/24/14 2124  Last data filed at 10/24/14 1300   Gross per 24 hour   Intake   1900 ml   Output      0 ml   Net   1900 ml       General: awake, alert, oriented x 3; in no acute distress.    HEENT: Pupils are equal and reactive, no pallor, no icterus, moist tongue and buccal mucosa. Neck is supple, trachea is midline, no lymphadenopathy.    Cardiovascular: S1 and S2 well heard. No gallop and no friction rub. No jugular venous distention.    Lungs:  No use of accessory muscles of respiration. Lung fields are clear to auscultation bilaterally.    Abdomen: soft, non-tender, non-distended; no palpable mass, no hepatosplenomegaly, bowel sounds well heard.    Extremities: Improved in duration and fading erythema left  middle finger. Deformity at flexion distal interphalangeal joint  of the left middle finger. Other extremities have Normal ROM. Normal muscle tone.     Neuro: Awake alert oriented 3. Cranial nerves grossly intact. No focal weakness or numbness. No gross lateralizing signs.    Skin: erythema left middle finger.    Meds:                                                                                                  [facility]     Medications were reviewed in the electronic record: [x]     Labs and Imaging:                                                                             [facility]     Procedure Component Value Units Date/Time    Dextrose Stick Glucose [[id number]]  (Abnormal) Collected:  10/24/14 1518    Specimen Information:  Blood Updated:  10/24/14 1535     Glucose, POCT 231 (H) mg/dL     Dextrose Stick Glucose [[id number]]  (Abnormal) Collected:  10/24/14 1116    Specimen Information:  Blood Updated:  10/24/14 1135     Glucose, POCT 211 (H) mg/dL     Dextrose Stick Glucose [[id number]]  (Abnormal) Collected:  10/24/14 0720    Specimen Information:  Blood Updated:  10/24/14 0752     Glucose, POCT 188 (H) mg/dL           Microbiology, reviewed and are significant for:  Microbiology Results     None          Imaging, reviewed and are significant for:  Radiology Results (24 Hour)     Procedure Component Value Units Date/Time    PICC Line Placement Single Lumen [[id number]] Collected:  10/24/14 1032    Order Status:  Completed Updated:  10/24/14 1034    Narrative:      Clinical History:  59 year old male cellulitis with need for intermediate term central venous access for antibiotic therapy    Examination:  Ultrasound guided puncture right basilic vein. Fluoroscopic-guided placement 5 French single-lumen 42 cm right basilic  vein PICC line.    Technique:  Methods, alternatives, potential benefits and risks of the procedure were fully explained. The patient understands and  wishes  to proceed. Consent signed.    All elements of maximal sterile barrier technique were followed including cap, mask, sterile gown and gloves, large  sterile sheet, hand hygiene, and 2% chlorhexidine for cutaneous antisepsis.    The right basilic vein was localized and confirmed to be patent. The vein was then accessed using real-time ultrasound  guidance. The needle was visualized entering the vein. An ultrasound image was captured and stored. A 42 cm long, 5  french single lumen catheter was introduced and advanced under fluoroscopic guidance to the  level of the lower superior  vena cava. The catheter was secured in place and flushed with heparinized saline.    The patient tolerated the procedure well without complication.   Fluoroscopy time: 10 seconds.    Findings:  The catheter is in good position at the end of the procedure and flushes and aspirates without difficulty.      Impression:      Successful ultrasound-guided puncture right basilic vein with fluoroscopic-guided placement 5 French single-lumen 42 cm  right basilic vein PICC line.    ReadingStation:[email or web address]          I have spent 40 minutes with the patient, discussing , of which greater than 50% of the time was spent counseling and coordinating care.    Signed by: Darra Lis, MD    [facility]  [address]  [place], [id number]

## 2014-10-24 NOTE — Procedures (Signed)
Image guidance used to place PICC.    Size: 12F  Arm:  right  Vein:  basilic  Lumen #:  1  Length:  42 cm    No complications.  PICC flushes and aspirates well.    Please see formal Radiology dictation for details of procedure.      Jackelyn Poling RPA/RA  Pager [phone]

## 2014-10-24 NOTE — Progress Notes (Signed)
Pt resting in bed, call bell in reach, pain controlled with po pain meds, VSS, input/ouput WNL, neurovasculars intact, PICC line in place, right upper arm, up ad lib in room, voices no concerns at this time

## 2014-10-24 NOTE — Progress Notes (Signed)
[facility]   [address]   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    Provided pt with choices of healthcare providers for HHA and infusion co  He chose [facility] and [facility].    Referrals made to them via edc and phone.    Infusion co can deliver medications to pts home this Sun 7/9 and [facility] can begin the IV antibiotic ed on Sunday PM  7/9.    Transportation: pvt car    Barriers to discharge:  none    Mission Plan and Needs: home with IV antibiotics with [facility], [facility],  F/u appts  PICC line        Donnie Mesa RN  BSN ONC [facility]  Orthopaedic Nurse Case Manager  [facility]  Cell : [phone]  Fax : [phone]

## 2014-10-24 NOTE — Progress Notes (Signed)
Infectious Disease Medication Order for:  Michael [name], DOMZALSKI  DOB:  10-16-1955,  58 y.o.  MRN:  [id number]  PCP:   Alonna Minium, DO   Infection Diagnosis MRSA osteomyelitis finger    Drug 1 of  . Drug 2 of   . Drug 3 of  .   Antibiotic Name vancomycin     Route Intravenous Infusion Intravenous Infusion Intravenous Infusion   Dose & Frequency   1250mg  every  12 hours.   every    hours.   every    hours.   Duration of treatment  28 days   days   days   Start Date 7/8     Labs Needed for monitoring     Weekly cbc, cmp, vanco trough    Estimated Creatinine Clearance: 104 mL/min (based on Cr of 0.82).   PICC line removal  Should PICC line be removed by Home Health after antibiotic therapy is completed? - /NO   ID Physician will follow the patient outside the hospital? Yes; anticipate 2 weeks iv abx to control cellulitis then amputation at middle phalanx   Follow up at  7-10 days       Rosiland Oz, MD  10/24/2014, 11:54 AM    -  By electronically signing my note, I affirm that this is a valid medical order that can be used outside the hospital.

## 2014-10-25 DIAGNOSIS — B999 Unspecified infectious disease: Secondary | ICD-10-CM | POA: Diagnosis present

## 2014-10-25 DIAGNOSIS — M869 Osteomyelitis, unspecified: Secondary | ICD-10-CM

## 2014-10-25 LAB — VH DEXTROSE STICK GLUCOSE
Glucose POCT: 170 mg/dL — ABNORMAL HIGH (ref 70–99)
Glucose POCT: 250 mg/dL — ABNORMAL HIGH (ref 70–99)
Glucose POCT: 182 mg/dL — ABNORMAL HIGH (ref 70–99)
Glucose POCT: 155 mg/dL — ABNORMAL HIGH (ref 70–99)

## 2014-10-25 LAB — CREATININE, SERUM
Creatinine: 0.79 mg/dL — ABNORMAL LOW (ref 0.80–1.30)
EGFR: >60 mL/min/{1.73_m2}

## 2014-10-25 NOTE — Progress Notes (Signed)
Pt up ab-lib and walked in room.  Pain was controlled with oral medications.  Pt is voiding WNL.  Neuro-vascular assessment is intact.  Vital signs are stable.  End of shift.

## 2014-10-25 NOTE — Plan of Care (Signed)
Problem: Safety  Goal: Patient will be free from injury during hospitalization  Outcome: Progressing    Problem: Pain  Goal: Patient's pain/discomfort is manageable  Outcome: Progressing

## 2014-10-25 NOTE — Plan of Care (Signed)
Problem: Pain  Goal: Patient's pain/discomfort is manageable  Pt pain controlled with po pain meds

## 2014-10-25 NOTE — Plan of Care (Signed)
Assumed care of pt, assessment complete, pt denies any needs at this time. PICC intact.     Reviewed plan of care for the day.     Will continue to monitor on rounds.

## 2014-10-25 NOTE — Progress Notes (Signed)
Pharmacy Vancomycin Dosing Consult Note  Michael [name]    Assessment:   1. Day 2 vanco for osteomyeletis; Hx MRSA; DM;CAD. Failed outpatient abx treatment. Pt to discharged home with home IV therapy on 1250mg  q12h SCr 0.79 today. Afebrile.  2. Would consider increasing dose to achieve higher trough level post discharge based on clinical improvement.       Plan:   1. Continue vanco to 1250 mg IV q12h.  2. Vanco trough goal 15-20 if osteomyelitis   3. Pharmacy will follow the patient's renal function, vancomycin dosing and levels. Any questions please call [phone].     Indication: cellulitis/ possible osteomyelitis of L middle finger  Hx    CAD, HTN, DM    Age: 59 y.o.  Weight: 82kg  IBW: 63.8 kg  DW: <29%     Pharmacokinetics:   t50: 9 hrs         Kel: 0.077 hr-1        Vd: 57.4 L         Expected Trough: 14 mg/L                Recent Labs  Lab 10/25/14  0521 10/23/14  0733 10/22/14  2141   CREATININE 0.79* 0.82 1.14   BUN  --  13 14       Recent Labs  Lab 10/22/14  2141   WBC 5.7     Temp (24hrs), Avg:97.6 F (36.4 C), Min:97.3 F (36.3 C), Max:98.1 F (36.7 C)      Cultures:   Date Source Organism Sensitivities Resistance

## 2014-10-25 NOTE — Progress Notes (Signed)
Pt rested in bed today, walked in room, up adlib  Pain was controlled with oral medications.  Finger swollen and unable to flex, no drainage.  Pt is voiding WNL in the  Neuro-vascular assessment is intact.  Vital  signs reviewed:    Temp: 97.5 F (36.4 C), Heart Rate: 80, Resp Rate: 15, BP: 124/65 mmHg    Report given to accepting RN     End of shift.

## 2014-10-25 NOTE — Progress Notes (Signed)
PROGRESS NOTE - [facility]    Date Time: 10/25/2014 7:48 AM  Patient Name: Michael [name]  Attending Physician: Cala Bradford, MD  Today's date: 10/25/2014  Length of Stay: 3  Assessment:                                                                                       [facility] Hospital[facility]   Principal Problem:    Cellulitis of middle finger, left  Active Problems:    CAD (coronary artery disease)    Cellulitis of finger    Type 2 diabetes mellitus    Hypertension        Plan:                                                                                                    [facility] Hospital[facility]   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   No reported fever  Continue vancomycin current dose  Pharmacy to dose medications  Appreciate Orthopedic Dr. Manson Passey help  Appreciate Dr. Norm Parcel help.  Infusion company to deliver medications to pts home this Wynelle Link and [facility] can begin the IV antibiotic on Sunday PM.   Anticipate discharge in 24 hours  Type 2 diabetes mellitus.   Continue home medications   Sliding scale insulin coverage  Hypertension. Stable   Continue home medications with holding parameters.  Hyperlipidemia  Continue atorvastatin  Coronary artery disease. Stable at present time no chest pain.   Continue home medications.  DVT prophylaxis on Lovenox subcutaneous    I have spent 35 minutes with the patient discussing the principle problem (listed above), the active hospital problems (listed above), discharge planning issues and medications (current therapy and side effects). Greater than 50% of the time was spent counseling the patient and coordinating care with patient's family members, consulting physician on Treatment Team, Nursing staff (RN/LPN) and PT/OT.        Lines:                                                                                                  [facility]     Patient Lines/Drains/Airways Status    Active PICC Line /  CVC Line / PIV Line / Drain / Airway / Intraosseous Line / Epidural Line / ART Line / Line / Wound /  Pressure Ulcer / NG/OG Tube     Name:   Placement date:   Placement time:   Site:   Days:    PICC Single Lumen 10/24/14 Right Basilic  10/24/14   0825   Basilic   less than 1    Peripheral IV 10/23/14 Right Forearm  10/23/14   1513   Forearm   1                Subjective                                                                                          [facility]     CC: Cellulitis of middle finger, left     Denies fever or chest pain, no overnight tissue.  Review of Systems:                                                                           [facility] Hospital[facility]     Constitutional:  Denies fever or fatigue.    ENT:  Denies sore throat or trouble swallowing.    CV:  Denies chest pain or palpitations.    Respiratory:  Denies SOB or cough.    GI:  Denies nausea, vomiting, diarrhea, constipation or abdominal pain.    GU:  Denies pain or burning with urination.    MS:  Denies new muscle or joint pain.    Skin:  Denies rash.    Heme: Denies any bleeding.    Physical Exam:                                                                                   [facility] Hospitalists   Temp:  [97.3 F (36.3 C)-98.1 F (36.7 C)] 97.3 F (36.3 C)  Heart Rate:  [74-79] 74  Resp Rate:  [16-18] 16  BP: (113-122)/(63-67) 113/67 mmHg    Intake/Output Summary (Last 24 hours) at 10/25/14 0748  Last data filed at 10/24/14 2300   Gross per 24 hour   Intake   1250 ml   Output      0 ml   Net   1250 ml     General appearance - awake, alert, in no acute distress.   Eyes - Pink conjunctiva, sclera anicteric, pupils equal and reactive, extraocular eye movements intact   Ears -. Normal hearing   Mouth - mucous membranes slightly dry, pharynx normal without lesions and tonsils normal   Neck - supple, no significant adenopathy   Lymphatics - no palpable lymphadenopathy, no hepatosplenomegaly  Chest -clear to auscaltation. No  accessory muscles use and no cyanosis and no clubing   CVS - normal S1, S2, no rubs, clicks or gallops. No jugular vein distention. peripheral pulses normal, no clubbing or cyanosis.   Abdomen - soft non tender, bowel sounds well heard   Musculoskeletal - no edema, no gross joint tenderness, deformity or swelling -   Integumentary system - Skin normal coloration and turgor, no suspicious skin lesions noted   Neurological - alert, oriented x 3, normal speech,Cranial nerves grossly intact, no lateralizing signs      Meds:                                                                                                  [facility] Hospital[facility]     Estimated Creatinine Clearance: 107.9 mL/min (based on Cr of 0.79).    Current Facility-Administered Medications   Medication Dose Route Frequency   . amitriptyline  100 mg Oral QHS   . aspirin  81 mg Oral Daily   . atorvastatin  40 mg Oral QHS   . enoxaparin  40 mg Subcutaneous QHS   . famotidine  20 mg Oral BID   . fenofibrate  54 mg Oral Daily   . glimepiride  8 mg Oral QAM AC   . heparin FLUSH  3-5 mL Intracatheter Daily   . hydrochlorothiazide  12.5 mg Oral Daily   . hydrOXYzine  50 mg Oral QHS   . insulin glargine  10 Units Subcutaneous QHS   . isosorbide mononitrate  30 mg Oral Daily   . metoprolol  100 mg Oral BID   . prasugrel  10 mg Oral Daily   . sitaGLIPtin  100 mg Oral Daily   . sodium chloride (PF)  10 mL Intracatheter Daily   . sodium chloride (PF)  10 mL Intracatheter Daily   . sodium chloride (PF)  3 mL Intravenous Q8H   . vancomycin  1,250 mg Intravenous Q12H   . vancomycin therapy placeholder   Does not apply See Admin Instructions       PRN medications: acetaminophen **OR** acetaminophen, dextrose, insulin aspart, insulin aspart, naloxone, nitroglycerin, ondansetron, oxyCODONE-acetaminophen, sodium chloride (PF)    IV Drips:         Labs and Imaging:                                                                             [facility] Hospitalists   RECENT LABS  (from the last 7 days)    Recent Labs  Lab 10/22/14  2141   WBC 5.7   HEMOGLOBIN 13.1   HEMATOCRIT 38.9*   PLT CT 212       Recent Labs  Lab 10/22/14  2141   PT 11.7*   PT INR 1.1   APTT 22.4*  Recent Labs  Lab 10/25/14  0521 10/23/14  0733 10/22/14  2141   GLUCOSE  --  162* 298*   SODIUM  --  136 138   POTASSIUM  --  3.8 4.3   CHLORIDE  --  102 101   CO2  --  26.4 27.2   BUN  --  13 14   CREATININE 0.79* 0.82 1.14   EGFR >60 >60 >60   CALCIUM  --  8.8 9.2       Recent Labs  Lab 10/22/14  2141   MAGNESIUM 1.9   PHOSPHORUS 3.4       Recent Labs  Lab 10/23/14  0733   ALBUMIN 3.4*   PROTEIN, TOTAL 6.3   BILIRUBIN, TOTAL 1.5*   ALKALINE PHOSPHATASE 45   ALT 27   AST (SGOT) 23          Invalid input(s):  AMORPHOUSUA   Lab Results   Component Value Date    HGBA1CPERCNT 11.9 04/12/2014          Microbiology, reviewed and are significant for:  Microbiology Results     None          Imaging, reviewed and are significant for:  Radiology Results (24 Hour)     Procedure Component Value Units Date/Time    PICC Line Placement Single Lumen [[id number]] Collected:  10/24/14 1032    Order Status:  Completed Updated:  10/24/14 1034    Narrative:      Clinical History:  59 year old male cellulitis with need for intermediate term central venous access for antibiotic therapy    Examination:  Ultrasound guided puncture right basilic vein. Fluoroscopic-guided placement 5 French single-lumen 42 cm right basilic  vein PICC line.    Technique:  Methods, alternatives, potential benefits and risks of the procedure were fully explained. The patient understands and  wishes to proceed. Consent signed.    All elements of maximal sterile barrier technique were followed including cap, mask, sterile gown and gloves, large  sterile sheet, hand hygiene, and 2% chlorhexidine for cutaneous antisepsis.    The right basilic vein was localized and confirmed to be patent. The vein was then accessed using real-time ultrasound  guidance. The needle  was visualized entering the vein. An ultrasound image was captured and stored. A 42 cm long, 5  french single lumen catheter was introduced and advanced under fluoroscopic guidance to the level of the lower superior  vena cava. The catheter was secured in place and flushed with heparinized saline.    The patient tolerated the procedure well without complication.   Fluoroscopy time: 10 seconds.    Findings:  The catheter is in good position at the end of the procedure and flushes and aspirates without difficulty.      Impression:      Successful ultrasound-guided puncture right basilic vein with fluoroscopic-guided placement 5 French single-lumen 42 cm  right basilic vein PICC line.    ReadingStation:[id number]            Signed by: Cala Bradford, MD    [facility]  [address]  [place], [place] [phone]  Pager [phone]

## 2014-10-25 NOTE — Progress Notes (Signed)
PROGRESS NOTE    Date Time: 10/25/2014 10:13 AM  Patient Name: Michael [name]    Assessment:   Principal Problem:    Cellulitis of middle finger, left  Active Problems:    CAD (coronary artery disease)    Cellulitis of finger    Type 2 diabetes mellitus    Hypertension    Infection      Plan:   Home tomorrow on iv vancomycin as planned    Subjective:   Doing well    Medications:     Current Facility-Administered Medications   Medication Dose Route Frequency   . amitriptyline  100 mg Oral QHS   . aspirin  81 mg Oral Daily   . atorvastatin  40 mg Oral QHS   . enoxaparin  40 mg Subcutaneous QHS   . famotidine  20 mg Oral BID   . fenofibrate  54 mg Oral Daily   . glimepiride  8 mg Oral QAM AC   . heparin FLUSH  3-5 mL Intracatheter Daily   . hydrochlorothiazide  12.5 mg Oral Daily   . hydrOXYzine  50 mg Oral QHS   . insulin glargine  10 Units Subcutaneous QHS   . isosorbide mononitrate  30 mg Oral Daily   . metoprolol  100 mg Oral BID   . prasugrel  10 mg Oral Daily   . sitaGLIPtin  100 mg Oral Daily   . sodium chloride (PF)  10 mL Intracatheter Daily   . sodium chloride (PF)  10 mL Intracatheter Daily   . sodium chloride (PF)  3 mL Intravenous Q8H   . vancomycin  1,250 mg Intravenous Q12H   . vancomycin therapy placeholder   Does not apply See Admin Instructions       Physical Exam:     Filed Vitals:    10/25/14 0755   BP: 106/63   Pulse: 66   Temp: 97.4 F (36.3 C)   Resp: 16   SpO2: 93%       Intake and Output Summary (Last 24 hours) at Date Time    Intake/Output Summary (Last 24 hours) at 10/25/14 1013  Last data filed at 10/24/14 2300   Gross per 24 hour   Intake   1250 ml   Output      0 ml   Net   1250 ml       Afebrile; finger edema and erythema unchanged    Labs:     Results     Procedure Component Value Units Date/Time    Dextrose Stick Glucose [[id number]]  (Abnormal) Collected:  10/25/14 0659    Specimen Information:  Blood Updated:  10/25/14 0734     Glucose, POCT 170 (H) mg/dL     Creatinine, Serum  [[id number]]  (Abnormal) Collected:  10/25/14 0521    Specimen Information:  Plasma Updated:  10/25/14 0648     Creatinine 0.79 (L) mg/dL      EGFR >52 WU/XLK/4.40N0     Vancomycin, Trough [[id number]] Collected:  10/24/14 2302    Specimen Information:  Plasma Updated:  10/24/14 2347     Vancomycin Trough 10.68 mcg/mL     Dextrose Stick Glucose [[id number]]  (Abnormal) Collected:  10/24/14 2113    Specimen Information:  Blood Updated:  10/24/14 2130     Glucose, POCT 125 (H) mg/dL     Dextrose Stick Glucose [[id number]]  (Abnormal) Collected:  10/24/14 1518    Specimen Information:  Blood Updated:  10/24/14 1535  Glucose, POCT 231 (H) mg/dL     Dextrose Stick Glucose [[id number]]  (Abnormal) Collected:  10/24/14 1116    Specimen Information:  Blood Updated:  10/24/14 1135     Glucose, POCT 211 (H) mg/dL           Rads:   Radiological Procedure reviewed.    Signed by: Rosiland Oz, MD

## 2014-10-26 LAB — VH DEXTROSE STICK GLUCOSE
Glucose POCT: 133 mg/dL — ABNORMAL HIGH (ref 70–99)
Glucose POCT: 219 mg/dL — ABNORMAL HIGH (ref 70–99)

## 2014-10-26 LAB — CREATININE, SERUM
Creatinine: 0.8 mg/dL (ref 0.80–1.30)
EGFR: >60 mL/min/{1.73_m2}

## 2014-10-26 MED ORDER — VANCOMYCIN HCL 1000 MG IV SOLR
1.2500 g | Freq: Two times a day (BID) | INTRAVENOUS | Status: DC
Start: 2014-10-26 — End: 2015-01-26

## 2014-10-26 MED ORDER — OXYCODONE-ACETAMINOPHEN 7.5-325 MG PO TABS
1.0000 | ORAL_TABLET | ORAL | Status: DC | PRN
Start: 2014-10-26 — End: 2016-04-27

## 2014-10-26 MED ORDER — ACETAMINOPHEN 325 MG PO TABS
650.0000 mg | ORAL_TABLET | ORAL | Status: DC | PRN
Start: 2014-10-26 — End: 2017-12-19

## 2014-10-26 NOTE — Plan of Care (Signed)
A&Ox4, pleasant and cooperative with POC.  Pain controlled with Percocet.  Edema to left middle finger without change.  Erythema to finger unchanged this shift.  Pt reports decrease in edema and erythema from admission date.   Skin peeling from back of hand.   PICC line patent to LUA, flushed with ease before and after Vancomycin.  Voids in urinal without difficulty.  Up ad lib.  No needs voiced at this time.

## 2014-10-26 NOTE — Discharge Instructions (Signed)
A visiting nurse will assist with your care at home from [facility]. They should contact you before they visit. They are planning to see you on Sunday pm to begin your IV antibiotic education thru your PICC line. They will assist with dressing changes to the PICC line.    Phone # [phone].    Your IV antibiotics will be delivered to your home from [facility] [phone]    Do not use your picc line for anything than its intended use    Inject Vancomycin through the PICC line every 12 hours as instructed until the prescription is completed          Discharge Instructions for Cellulitis  You have been diagnosed with cellulitis. This is an infection in the deepest layer of the skin. In some cases, the infection also affects the muscle. Cellulitis is caused by bacteria. The bacteria canenter the body through broken skin. This can happen with a cut, scratch, animal bite, or an insect bite that has been scratched. You may have been treated in the hospital with antibiotics and fluids. You will likely be given a prescription for antibiotics to take at home. This sheet will help you take care of yourself at home.  Home Care  When you are home:   Take the prescribed antibiotic medication you are given as directed until it is gone. Take it even if you feel better. It treats the infection and stops it from returning. Not taking all of the medication can make future infections hard to treat.   Keep the infected area clean.   When possible, raise the infected area above the level of your heart. This helps keep swelling down.   Talk to your doctor if you are in pain. Ask what kind of over-the-counter medication you can take for pain.   Apply clean bandages as advised.   Take your temperature once a day for a week.   Wash your hands often to prevent spreading the infection.  In the future, wash your hands before and after you touch cuts, scratches, or bandages. This will help prevent  infection.     20 00-2015 [employer] [address] [place], [place] [address]. All rights reserved. This information is not intended as a substitute for professional medical care. Always follow your healthcare professional's instructions.

## 2014-10-26 NOTE — Discharge Summary (Signed)
DISCHARGE SUMMARY - [facility]    Patient Name: Michael [name]  Attending Physician: Michael Bradford, MD  Primary Care Physician: Michael Minium, DO    Date of Admission: 10/22/2014  Date of Discharge: 10/26/2014  Length of Stay in the Hospital: 4    Discharge Diagnoses:                                                                      [facility] Problems    Diagnosis POA   . Principal Problem: Cellulitis of middle finger, left Yes   . Cellulitis of finger Yes   . Type 2 diabetes mellitus Yes   . Hypertension Yes   . CAD (coronary artery disease) Yes      Resolved Hospital Problems    Diagnosis POA   . Infection Yes       Discharge Condition: fair    Discharge Instructions:                                                                   [facility]      Disposition:  home    Diet: Cardiac Diet, Consistent Carbohydrates    Activity: As tolerated and May resume normal activities    Discharge Code Status: Full Code    Patient was instructed to follow up with:   Michael Oz, MD  [address]  [place] [address]  [phone]      July 18 @ 2:15    Michael Minium, DO  [address]  [address]  [place] [address]  [phone]    In 1 week      Michael Idol, MD  [address]  [place] [address]  [phone]    In 2 weeks        Complete instructions are in the patient's After Visit Summary (AVS).    Discharge Medications:                                                                    [facility]        Medication List      START taking these medications          acetaminophen 325 MG tablet   Commonly known as:  TYLENOL   Take 2 tablets (650 mg total) by mouth every 4 (four) hours as needed for Fever (temperature greater than 38.5 C).       oxyCODONE-acetaminophen 7.5-325 MG per tablet   Commonly known as:  PERCOCET   Take 1 tablet by mouth every 4 (four) hours as needed.       vancomycin 1000 MG injection   Commonly known as:   VANCOCIN   Inject 1.25  g into the vein every 12 (twelve) hours.         CHANGE how you take these medications          atorvastatin 10 MG tablet   Commonly known as:  LIPITOR   Take 4 tablets (40 mg total) by mouth nightly.   What changed:  when to take this       insulin glargine 100 UNIT/ML injection pen   Commonly known as:  LANTUS SOLOSTAR   Inject 10 Units into the skin nightly.   What changed:  how much to take         CONTINUE taking these medications          aspirin 81 MG chewable tablet   Chew 1 tablet (81 mg total) by mouth daily.       fenofibrate 48 MG tablet   Commonly known as:  TRICOR   Take 2 tablets (96 mg total) by mouth daily.       glimepiride 4 MG tablet   Commonly known as:  AMARYL       hydrochlorothiazide 12.5 MG tablet   Commonly known as:  HYDRODIURIL       hydrOXYzine 50 MG capsule   Commonly known as:  VISTARIL   Take 1 capsule (50 mg total) by mouth nightly.       insulin aspart 100 UNIT/ML injection pen   Commonly known as:  NovoLOG   Inject 1-5 Units into the skin 4 times daily - with meals and at bedtime.       isosorbide mononitrate 30 MG 24 hr tablet   Commonly known as:  IMDUR       METOPROLOL TARTRATE PO       nitroglycerin 0.4 MG SL tablet   Commonly known as:  NITROSTAT       prasugrel 5 MG Tabs   Commonly known as:  EFFIENT   Take 2 tablets (10 mg total) by mouth daily.       ranitidine 300 MG tablet   Commonly known as:  ZANTAC       sitaGLIPtin 100 MG tablet   Commonly known as:  JANUVIA         Where to Get Your Medications     These are the prescriptions that you need to pick up.         You may get the following medications from any pharmacy   -  acetaminophen 325 MG tablet   -  oxyCODONE-acetaminophen 7.5-325 MG per tablet                Information on where to get these meds is not yet available. Ask your nurse or doctor.         -  vancomycin 1000 MG injection                      Admission H&P summary:                                                                  [facility] Michael [name] is a 59 y.o. male who presents to the [facility]. As a transfer from [facility] for evaluation and management of left medial finger swelling  and erythema failed outpatient oral antibiotic.    Patient was in his usual state of health until 5 weeks back at which time he started to experience painful erythematosus swelling at the distal interphalangeal joint of his left middle finger. He squeezed it and weakness a thick yellowish material coming out of it. He went to his primary care provider and he was started on oral antibiotics tablet to be taken twice a day.    4 days after the oral antibiotics patient went to the emergency room because of worsening swelling and erythema left middle finger this time extending to dorsum of left hand. at this time he was hospitalized and received 2 weeks of antibiotics he remembers 1 of which to be IV vancomycin. Patient improved was discharged on oral antibiotics.    Today patient went to emergency room at [facility] because of pain and persistence of swelling and erythema left middle finger. From where patient was transferred to our hospital for direct admission evaluation and management of this swelling erythema and pain left middle finger.  (See full History and Physical for details.)  Consultations:                                                                                    [facility]     Treatment Team: Attending Provider: Cala Bradford, MD; Consulting Physician: Michael Oz, MD; Registered Nurse: Jearld Pies, April T, RN    Procedures/Radiology performed:                                                 [facility]     CT FINGER LEFT WO CONTRAST  XR HAND LEFT PA LATERAL AND OBLIQUE  PICC LINE PLACEMENT SINGLE LUMEN      No orders of the defined types were placed in this encounter.       Allergies:                                                                    [facility]      Gabapentin; Glucophage; Keflex; and Morphine        Hospital Course:                                                                                [facility]   He was admitted for septic arthritis of distal interphalangeal joints and osteomyelitis of the left middle finger. He was treated with IV antibiotics for MRSA infection.  IV vancomycin to be continued as outpatient and infusion center has been consulted. Patient is discharged in relatively stable condition to continue care. He can go back to work as October 28, 2014.  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   No reported fever  Continue vancomycin current dose  Appreciate Orthopedic Dr. Manson Passey in follow-up as outpatient  Appreciate Dr. Norm Parcel help and follow-up as outpatient  Infusion company to deliver medications to pts home this Wynelle Link and Kennedy Bucker [facility] can begin the   Type 2 diabetes mellitus.   Continue home medications   Sliding scale insulin coverage  Hypertension. Stable   Continue home medications with holding parameters.  Hyperlipidemia  Continue atorvastatin  Coronary artery disease. Stable at present time no chest pain.   Continue home medications.    Discharge Day Exam:  Temp:  [97.5 F (36.4 C)-97.8 F (36.6 C)] 97.8 F (36.6 C)  Heart Rate:  [69-81] 81  Resp Rate:  [15-18] 16  BP: (114-124)/(60-68) 114/68 mmHg  Wt Readings from Last 3 Encounters:   10/23/14 90.266 kg (199 lb)   09/08/14 82 kg (180 lb 12.4 [name])   04/12/14 89.6 kg (197 lb 8.5 [name])     General appearance - awake, alert, in no acute distress.   Eyes - Pink conjunctiva, sclera anicteric, pupils equal and reactive, extraocular eye movements intact   Ears -. Normal hearing   Mouth - mucous membranes slightly dry, pharynx normal without lesions and tonsils normal   Neck - supple, no significant adenopathy   Lymphatics - no palpable lymphadenopathy, no hepatosplenomegaly   Chest -clear to auscaltation. No accessory muscles  use and no cyanosis and no clubing   CVS - normal S1, S2, no rubs, clicks or gallops. No jugular vein distention. peripheral pulses normal, no clubbing or cyanosis.   Abdomen - soft non tender, bowel sounds well heard   Musculoskeletal - no edema, no gross joint tenderness, deformity or swelling -   Integumentary system - Skin normal coloration and turgor, no suspicious skin lesions noted   Neurological - alert, oriented x 3, normal speech,Cranial nerves grossly intact, no lateralizing signs    Discharge Condition:                                                                        [facility]     The patient was discharged in stable condition.  Time spent coordinating discharge and reviewing discharge plan:  48 minutes      Signed by: Michael Bradford, MD    [facility]  [address]  [place], [place] [address]  Pager [phone]  CC: Leola Brazil B, DO

## 2014-10-26 NOTE — Progress Notes (Signed)
Pharmacy Vancomycin Dosing Consult Note  Michael [name]    Assessment:   1. Day 3 vanco for osteomyeletis; Hx MRSA; DM;CAD. Failed outpatient abx treatment. Pt to discharged home with home IV therapy on 1250mg  q12h SCr 0.79 today. Afebrile.  2. Would consider increasing dose to achieve higher trough level post discharge based on clinical improvement.       Plan:   1. Continue vanco to 1250 mg IV q12h.  2. Vanco trough goal 15-20 if osteomyelitis   3. Pharmacy will follow the patient's renal function, vancomycin dosing and levels. Any questions please call [phone].     Indication: cellulitis/ possible osteomyelitis of L middle finger  Hx    CAD, HTN, DM    Age: 60 y.o.  Weight: 82kg  IBW: 63.8 kg  DW: <29%     Pharmacokinetics:   t50: 9 hrs         Kel: 0.077 hr-1        Vd: 57.4 L         Expected Trough: 14 mg/L                Recent Labs  Lab 10/26/14  0511 10/25/14  0521 10/23/14  0733 10/22/14  2141   CREATININE 0.80 0.79* 0.82 1.14   BUN  --   --  13 14       Recent Labs  Lab 10/22/14  2141   WBC 5.7     Temp (24hrs), Avg:97.7 F (36.5 C), Min:97.5 F (36.4 C), Max:97.8 F (36.6 C)      Cultures:   Date Source Organism Sensitivities Resistance

## 2014-10-26 NOTE — Progress Notes (Signed)
Discharge instructions completed with the patient. Now being discharged home with RUA PICC in place. Instructed pt to only use the PICC line for IV antibiotic infusion.

## 2014-10-26 NOTE — Plan of Care (Signed)
Pt remains A/O x 4. Ambulates in the room without difficulty. Has remained free of fall during this admission. Encouraged him to call for assistance at anytime. States left 3rd finger pain is tolerable. Medicated once this shift for pain control. Left 3rd finger remains edematous and reddened along with bent in a fixed position at the distal interphalangeal joint. RUA PICC dressing remain D/I, able to aspirate blood prior to and after infusion, line flushed per protocol. Informed pt that a call was received from the pharmacy stating is antibiotics would be delivered to his home today.

## 2014-10-29 NOTE — Retrospective Coding Query (Signed)
PHYSICIAN'S DOCUMENTATION                                                                      REQUEST                                                                         Date of Request:  10/29/2014  Type of Request:  DOCUMENTATION CLARIFICATION                                         Patient Name: Michael [name], Michael [name]  Account #: [id number]  MR #: [id number]  Discharge Date: 10/26/2014       Dear Dr. Wonda Cerise,    The medical record reflects the following: the patient was admitted with osteomyelitis and cellulitis of the left long  Finger. The patient has a history of diabetes mellitus. Please clarify if the osteomyelitis is complication of the diabetes mellitus. Thank you for your help.      PHYSICIAN RESPONSE:              Coder Hazel Sams  Date 10/29/2014

## 2014-11-03 ENCOUNTER — Encounter (INDEPENDENT_AMBULATORY_CARE_PROVIDER_SITE_OTHER): Payer: Self-pay | Admitting: Orthopaedic Surgery

## 2014-11-03 ENCOUNTER — Ambulatory Visit (INDEPENDENT_AMBULATORY_CARE_PROVIDER_SITE_OTHER): Payer: BC Managed Care – PPO | Admitting: Surgical

## 2014-11-03 DIAGNOSIS — M79645 Pain in left finger(s): Secondary | ICD-10-CM

## 2014-11-03 NOTE — Progress Notes (Signed)
Progress Note    Subjective:    Michael [name] is a 59 y.o. male being seen for Hand Pain  .    HPI Pt. Is here today for a regular scheduled appt for a f/u on Left Long finger Osteomylitis.  Overall they are stating that their pain level is 6/10 VAS.  They deny any numbness distally, SOB or CP.  They reports maintaining NWB status.. They are continuing to elevated affected extremity to control edema if needed. He states that the finger is becoming more and more "tight" and the DIP joint deformity is becoming greater with less ROM.     The following portions of the patient's history were reviewed and updated as appropriate: allergies, current medications, past family history, past medical history, past social history, past surgical history and problem list.    Review of Systems   Constitutional: Negative.        Objective:    There were no vitals taken for this visit.    Physical Exam   Constitutional: He is oriented to person, place, and time. He appears well-developed and well-nourished.   HENT:   Head: Normocephalic and atraumatic.   Eyes: EOM are normal.   Neck: Normal range of motion.   Cardiovascular: Normal rate.    Pulmonary/Chest: Effort normal.   Musculoskeletal:        Left hand: He exhibits decreased range of motion, tenderness, bony tenderness, deformity and swelling. He exhibits normal capillary refill and no laceration. Normal sensation noted.        Hands:  Neurological: He is alert and oriented to person, place, and time. No sensory deficit.   Skin: Skin is warm and intact. No bruising, no ecchymosis and no rash noted. No cyanosis or erythema. No pallor.   Psychiatric: He has a normal mood and affect.   Nursing note and vitals reviewed.      Assessment:      1. Finger pain, left          Plan:     At this time we have reviewed their x-rays and clinical evaluation.  All questions have been answered.  I have discussed all option for their plan of care.  I am not refilling their pain meds today  and feel that they would benefit from a referral to a hand surg. Pt would like to be evaluated for surg.  Their WB status with be NWB.  Pt. Agrees with plan.       Follow up in: PRN

## 2014-12-19 ENCOUNTER — Ambulatory Visit
Admission: RE | Admit: 2014-12-19 | Discharge: 2014-12-19 | Disposition: A | Discharge status: Home / Self Care | Payer: BC Managed Care – PPO | Source: Ambulatory Visit | Attending: Hand Surgery | Admitting: Hand Surgery

## 2014-12-19 DIAGNOSIS — L03012 Cellulitis of left finger: Secondary | ICD-10-CM | POA: Insufficient documentation

## 2014-12-19 LAB — CBC AND DIFFERENTIAL
Basophils %: 0.6 % (ref 0.0–3.0)
Basophils Absolute: 0 10*3/uL (ref 0.0–0.3)
Eosinophils %: 3.3 % (ref 0.0–7.0)
Eosinophils Absolute: 0.2 10*3/uL (ref 0.0–0.8)
Hematocrit: 40.4 % (ref 39.0–52.5)
Hemoglobin: 14.2 gm/dL (ref 13.0–17.5)
Lymphocytes Absolute: 2 10*3/uL (ref 0.6–5.1)
Lymphocytes: 31.1 % (ref 15.0–46.0)
MCH: 30 pg (ref 28–35)
MCHC: 35 gm/dL (ref 32–36)
MCV: 85 fL (ref 80–100)
MPV: 7.5 fL (ref 6.0–10.0)
Monocytes Absolute: 0.4 10*3/uL (ref 0.1–1.7)
Monocytes: 5.8 % (ref 3.0–15.0)
Neutrophils %: 59.2 % (ref 42.0–78.0)
Neutrophils Absolute: 3.9 10*3/uL (ref 1.7–8.6)
PLT CT: 207 10*3/uL (ref 130–440)
RBC: 4.78 10*6/uL (ref 4.00–5.70)
RDW: 13.1 % (ref 11.0–14.0)
WBC: 6.6 10*3/uL (ref 4.0–11.0)

## 2014-12-19 LAB — C-REACTIVE PROTEIN: C-Reactive Protein: <0.02 mg/dL — ABNORMAL LOW (ref 0.02–0.80)

## 2014-12-19 LAB — SEDIMENTATION RATE: Sed Rate: 2 mm/hr (ref 0–20)

## 2015-01-26 ENCOUNTER — Ambulatory Visit: Payer: BC Managed Care – PPO | Attending: Hand Surgery

## 2015-01-26 DIAGNOSIS — Z01818 Encounter for other preprocedural examination: Secondary | ICD-10-CM | POA: Insufficient documentation

## 2015-01-26 LAB — COMPREHENSIVE METABOLIC PANEL
ALT: 30 U/L (ref 0–55)
AST (SGOT): 21 U/L (ref 10–42)
Albumin/Globulin Ratio: 1.29 Ratio (ref 0.70–1.50)
Albumin: 4 gm/dL (ref 3.5–5.0)
Alkaline Phosphatase: 52 U/L (ref 40–145)
Anion Gap: 11.7 mMol/L (ref 7.0–18.0)
BUN / Creatinine Ratio: 20.2 Ratio (ref 10.0–30.0)
BUN: 17 mg/dL (ref 7–22)
Bilirubin, Total: 2.1 mg/dL — ABNORMAL HIGH (ref 0.1–1.2)
CO2: 27.5 mMol/L (ref 20.0–30.0)
Calcium: 9.7 mg/dL (ref 8.5–10.5)
Chloride: 102 mMol/L (ref 98–110)
Creatinine: 0.84 mg/dL (ref 0.80–1.30)
EGFR: >60 mL/min/{1.73_m2}
Globulin: 3.1 gm/dL (ref 2.0–4.0)
Glucose: 295 mg/dL — ABNORMAL HIGH (ref 70–99)
Osmolality Calc: 286 mOsm/kg (ref 275–300)
Potassium: 4.2 mMol/L (ref 3.5–5.3)
Protein, Total: 7.1 gm/dL (ref 6.0–8.3)
Sodium: 137 mMol/L (ref 136–147)

## 2015-01-26 LAB — ECG 12-LEAD
P Wave Axis: 60 deg
P-R Interval: 182 ms
Patient Age: 59 years
Patient Height: 0
Patient Weight: 0
Q-T Interval(Corrected): 416 ms
Q-T Interval: 340 ms
QRS Axis: 24 deg
QRS Duration: 82 ms
T Axis: 45 years
Ventricular Rate: 90 //min

## 2015-01-26 LAB — CBC
Hematocrit: 41.9 % (ref 39.0–52.5)
Hemoglobin: 15.1 gm/dL (ref 13.0–17.5)
MCH: 31 pg (ref 28–35)
MCHC: 36 gm/dL (ref 32–36)
MCV: 85 fL (ref 80–100)
MPV: 6.7 fL (ref 6.0–10.0)
PLT CT: 215 10*3/uL (ref 130–440)
RBC: 4.96 10*6/uL (ref 4.00–5.70)
RDW: 12.7 % (ref 11.0–14.0)
WBC: 6 10*3/uL (ref 4.0–11.0)

## 2015-01-26 NOTE — H&P (Signed)
ADMISSION HISTORY AND PHYSICAL EXAM    Date Time: 01/26/2015 3:34 PM  Patient Name: Michael [name]  Attending Physician: Ricky Stabs*  Primary Care Provider:  Garey Ham, MD        Assessment:   Osteoarthritis left long finger    Plan:   Patient is scheduled to undergo a left long finger DIP fusion by Dr. Carlena Bjornstad on 01/29/2015.    History of Present Illness:   Michael [name] is a pleasant 59 y.o. 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 A culture of the finger revealed MRSA. He was treated by with an extensive course of IV antibiotics. He was followed by Dr. Ovidio Kin from the infectious disease department. The finger has remained reddened and swollen with mild flexion at the DIP joint. The DIP joint remains painful. He presents today for a preop history and physical in anticipation of having the above-named surgery.    Past Medical History:     Past Medical History   Diagnosis Date   . Gastroesophageal reflux disease    . Diabetes mellitus    . Hyperlipidemia    . Hypertension    . TIA (transient ischemic attack)    . CAD (coronary artery disease)    . Myocardial infarction    . Hernia    . Presence of stent in coronary artery      4 STENTS   . Neuropathy      diabetic   . Cellulitis of finger of left hand 10/22/14       Past Surgical History:     Past Surgical History   Procedure Laterality Date   . Coronary angioplasty with stent placement       2 stents   . Cervical fusion     . Lumbar laminectomy     . Coronary angioplasty with stent placement  12/13/12     DES to OM1, 2 stents   . Circumcision, > 13 years, adult N/A 09/08/2014     Procedure: CIRCUMCISION, > 13 YEARS, ADULT;  Surgeon: Ellwood Handler, MD;  Location: [facility];  Service: Urology;  Laterality: N/A;  CIRCUMCISION       Family History:   Mother- died from a myocardial infarction.  Father- died from a myocardial infarction.      Social History:   Tobacco- he smoked as a teenager.  ETOH-  rare  Illicit Drugs- denies  Married- no  Children- none  Occupation- [employer]  His family will be available to help him after surgery.      Allergies:     Allergies   Allergen Reactions   . Gabapentin    . Glucophage [Metformin Hydrochloride]    . Keflex [Cephalexin]    . Morphine        Medications:     Current/Home Medications    ACETAMINOPHEN (TYLENOL) 325 MG TABLET    Take 2 tablets (650 mg total) by mouth every 4 (four) hours as needed for Fever (temperature greater than 38.5 C).    ASPIRIN 81 MG CHEWABLE TABLET    Chew 1 tablet (81 mg total) by mouth daily.    ATORVASTATIN (LIPITOR) 10 MG TABLET    Take 4 tablets (40 mg total) by mouth nightly.    DOXYCYCLINE (VIBRAMYCIN) 100 MG CAPSULE    Take 100 mg by mouth daily.    DULOXETINE (CYMBALTA) 30 MG CAPSULE    Take 30 mg by mouth daily.  FENOFIBRATE (TRICOR) 48 MG TABLET    Take 2 tablets (96 mg total) by mouth daily.    GLIPIZIDE (GLUCOTROL) 10 MG TABLET    Take 10 mg by mouth 2 (two) times daily before meals.    HYDROCHLOROTHIAZIDE (HYDRODIURIL) 12.5 MG TABLET    Take 12.5 mg by mouth daily.    HYDROXYZINE (VISTARIL) 50 MG CAPSULE    Take 1 capsule (50 mg total) by mouth nightly.    INSULIN ASPART (NOVOLOG) 100 UNIT/ML INJECTION PEN    Inject 1-5 Units into the skin 4 times daily - with meals and at bedtime.    INSULIN GLARGINE (LANTUS SOLOSTAR) 100 UNIT/ML INJECTION PEN    Inject 10 Units into the skin nightly.    ISOSORBIDE MONONITRATE (IMDUR) 30 MG 24 HR TABLET    Take 30 mg by mouth daily.     METOPROLOL TARTRATE PO    Take 100 mg by mouth 2 (two) times daily.    NITROGLYCERIN (NITROSTAT) 0.4 MG SL TABLET    Place 0.4 mg under the tongue every 5 (five) minutes as needed.    OXYCODONE-ACETAMINOPHEN (PERCOCET) 7.5-325 MG PER TABLET    Take 1 tablet by mouth every 4 (four) hours as needed.    PRASUGREL (EFFIENT) 5 MG TAB    Take 2 tablets (10 mg total) by mouth daily.    RANITIDINE (ZANTAC) 300 MG TABLET    Take 300 mg by mouth daily.     SITAGLIPTIN (JANUVIA) 100 MG TABLET    Take 100 mg by mouth daily.    TRAMADOL (ULTRAM) 50 MG TABLET    Take 50 mg by mouth every 8 (eight) hours as needed for Pain.       Review of Systems:   A comprehensive review of systems was:     INFECTIOUS DISEASE:   Positive for history of MRSA in the left long finger.    ANESTHESIA REVIEW:    There is no personal or family history of anesthesia problems in the past. Patient can achieve greater than 4 METs of activity.    SLEEP APNEA:    There is no history of sleep apnea, pt does snore, and denies gasping for breath while asleep.     GENERAL:  There has not been weight gain or loss of >10# in the last six months.   Pt denies fever, chills, or excessive fatigue.    SKIN:  Positive for skin lesions on the left forearm and left palm.  HEENT:  Denies dental pain, bleeding gums, headache, visual changes, dizziness, nosebleeds,  sore throat.  RESPIRATORY:  Denies cough, dyspnea, wheezing or hemoptysis. There have been no PFTs in the last five years.  CARDIOVASCULAR:  Denies chest pain, palpitations or syncope. Patient denies any anginal symptoms. Left heart catheterization from 12/ 2014 showed patent RCA and circumflex stents. Nuclear stress test from 03/2014 showed no ischemia, an EF of 54%, and was read as an "unremarkable test."   ENDOCRINE:  Denies hx of thyroid disease, heat or cold intolerance, polyuria, polydipsia or polyphagia. Positive for type 2 diabetes mellitus.  GASTROINTESTINAL:  Denies nausea, vomiting, diarrhea, constipation, abdominal pain, dysphagia, melena or hematochezia. Positive for GERD.  GENITOURINARY:  Denies dysuria, frequency, hematuria, urgency, nocturia, urinary incontinence, hx of urolithiasis or hx of recurrent urinary tract infections.   HEMATOLOGICAL:  Denies bleeding disorders, blood clots, blood transfusions  MUSCULOSKELETAL:  Positive for pain in the left long finger at the DIP joint.  NEUROLOGICAL:  Denies seizures or paresthesias.  Positive  for history of a TIA and the patient denies any residual deficits.  PSYCHOLOGICAL:  Denies depression, anxiety or difficulty with memory.  ONCOLOGICAL:  Denies hx of cancer     Physical Exam:     Patient Vitals for the past 24 hrs:   BP Pulse Height Weight   01/26/15 1501 139/76 mmHg (!) 114 1.702 m (5\' 7" ) 90.266 kg (199 lb)       Body mass index is 31.16 kg/(m^2).        Mental status - alert, oriented to person, place, and time  HEENT - Head is atraumatic and normocephalic, Pupils are PERRLA, EOMs intact. Neck is supple, trachea midline. No JVD, thyromegaly, or carotid bruits. He is missing multiple teeth.  Lymphatics - not examined  Lungs - clear to auscultation bilat, no wheezes, rales or rhonchi.   Heart - normal rate, regular rhythm, normal S1, S2, no murmurs, rubs, clicks or gallops  Abdomen - soft, nontender, nondistended, normoactive bowel sounds.  GU exam deferred  Extremities - 2+ bilat radial pulses, 2+ bilat posterior tibial pulses, without clubbing, cyanosis, or edema.   Neurological - Cranial nerves II-XII intact, strength 5/5 bilat in upper and lower extremities, sensation intact to light touch globally.   Skin -  skin over the left long finger is reddened and swollen at the DIP joint. He has multiple open sores on the left palm and left forearm. I have advised Marisue Ivan at Dr Lezlie Lye office of the findings. The patient will report to their office as soon as he finishes his [facility] appointment for a skin check.       Signed by:  Paulla Dolly III, CFNP

## 2015-01-26 NOTE — Pre-Procedure Instructions (Addendum)
ECHO AND STRESS TEST DONE 03/2014, LEFT HEART CATH DONE 11/2012 IN [facility].

## 2015-01-27 NOTE — Pre-Procedure Instructions (Signed)
LIZ IN DR BAECHLER'S OFFICE AWARE OF MRSA.  MADE DR Carlena Bjornstad AWARE.  WILL PROCEED WITH SURGERY

## 2015-02-12 ENCOUNTER — Encounter: Payer: Self-pay | Admitting: Anesthesiology

## 2015-02-12 ENCOUNTER — Ambulatory Visit: Payer: BC Managed Care – PPO | Admitting: Hand Surgery

## 2015-02-12 ENCOUNTER — Encounter
Admission: RE | Disposition: A | Discharge status: Home / Self Care | Payer: Self-pay | Source: Ambulatory Visit | Attending: Hand Surgery

## 2015-02-12 ENCOUNTER — Ambulatory Visit
Admission: RE | Admit: 2015-02-12 | Discharge: 2015-02-12 | Disposition: A | Discharge status: Home / Self Care | Payer: BC Managed Care – PPO | Source: Ambulatory Visit | Attending: Hand Surgery | Admitting: Hand Surgery

## 2015-02-12 DIAGNOSIS — Z883 Allergy status to other anti-infective agents status: Secondary | ICD-10-CM | POA: Insufficient documentation

## 2015-02-12 DIAGNOSIS — Z8673 Personal history of transient ischemic attack (TIA), and cerebral infarction without residual deficits: Secondary | ICD-10-CM | POA: Insufficient documentation

## 2015-02-12 DIAGNOSIS — K219 Gastro-esophageal reflux disease without esophagitis: Secondary | ICD-10-CM | POA: Insufficient documentation

## 2015-02-12 DIAGNOSIS — Z8249 Family history of ischemic heart disease and other diseases of the circulatory system: Secondary | ICD-10-CM | POA: Insufficient documentation

## 2015-02-12 DIAGNOSIS — I2510 Atherosclerotic heart disease of native coronary artery without angina pectoris: Secondary | ICD-10-CM | POA: Insufficient documentation

## 2015-02-12 DIAGNOSIS — E119 Type 2 diabetes mellitus without complications: Secondary | ICD-10-CM | POA: Insufficient documentation

## 2015-02-12 DIAGNOSIS — E785 Hyperlipidemia, unspecified: Secondary | ICD-10-CM | POA: Insufficient documentation

## 2015-02-12 DIAGNOSIS — M1990 Unspecified osteoarthritis, unspecified site: Secondary | ICD-10-CM | POA: Insufficient documentation

## 2015-02-12 DIAGNOSIS — Z955 Presence of coronary angioplasty implant and graft: Secondary | ICD-10-CM | POA: Insufficient documentation

## 2015-02-12 DIAGNOSIS — Z7982 Long term (current) use of aspirin: Secondary | ICD-10-CM | POA: Insufficient documentation

## 2015-02-12 DIAGNOSIS — Z794 Long term (current) use of insulin: Secondary | ICD-10-CM | POA: Insufficient documentation

## 2015-02-12 DIAGNOSIS — I252 Old myocardial infarction: Secondary | ICD-10-CM | POA: Insufficient documentation

## 2015-02-12 DIAGNOSIS — Z888 Allergy status to other drugs, medicaments and biological substances status: Secondary | ICD-10-CM | POA: Insufficient documentation

## 2015-02-12 DIAGNOSIS — Z885 Allergy status to narcotic agent status: Secondary | ICD-10-CM | POA: Insufficient documentation

## 2015-02-12 DIAGNOSIS — I10 Essential (primary) hypertension: Secondary | ICD-10-CM | POA: Insufficient documentation

## 2015-02-12 LAB — VH DEXTROSE STICK GLUCOSE: Glucose POCT: 350 mg/dL — ABNORMAL HIGH (ref 70–99)

## 2015-02-12 SURGERY — ARTHRODESIS, FINGER
Anesthesia: Anesthesia Choice | Laterality: Left

## 2015-02-12 MED ORDER — LACTATED RINGERS IV SOLN
INTRAVENOUS | Status: DC
Start: 2015-02-12 — End: 2015-02-12

## 2015-02-12 MED ORDER — PROPOFOL 10 MG/ML IV EMUL (WRAP)
INTRAVENOUS | Status: AC
Start: 2015-02-12 — End: ?
  Filled 2015-02-12: qty 40

## 2015-02-12 MED ORDER — ONDANSETRON HCL 4 MG/2ML IJ SOLN
INTRAMUSCULAR | Status: AC
Start: 2015-02-12 — End: ?
  Filled 2015-02-12: qty 2

## 2015-02-12 MED ORDER — CEFAZOLIN SODIUM-DEXTROSE 2-3 GM-% IV SOLR
2.0000 g | Freq: Three times a day (TID) | INTRAVENOUS | Status: DC
Start: 2015-02-12 — End: 2015-02-12

## 2015-02-12 MED ORDER — INSULIN REGULAR HUMAN 100 UNIT/ML IJ SOLN
3.0000 [IU] | Freq: Once | INTRAMUSCULAR | Status: AC
Start: 2015-02-12 — End: 2015-02-12
  Administered 2015-02-12: 3 [IU] via SUBCUTANEOUS
  Filled 2015-02-12: qty 3

## 2015-02-12 MED ORDER — DEXAMETHASONE SODIUM PHOSPHATE 4 MG/ML IJ SOLN
INTRAMUSCULAR | Status: AC
Start: 2015-02-12 — End: ?
  Filled 2015-02-12: qty 1

## 2015-02-12 MED ORDER — CLINDAMYCIN PHOSPHATE IN D5W 900 MG/50ML IV SOLN
900.0000 mg | INTRAVENOUS | Status: DC
Start: 2015-02-12 — End: 2015-02-12
  Filled 2015-02-12 (×2): qty 50

## 2015-02-12 MED ORDER — MIDAZOLAM HCL 2 MG/2ML IJ SOLN
INTRAMUSCULAR | Status: AC
Start: 2015-02-12 — End: ?
  Filled 2015-02-12: qty 2

## 2015-02-12 MED ORDER — FENTANYL CITRATE (PF) 50 MCG/ML IJ SOLN (WRAP)
INTRAMUSCULAR | Status: AC
Start: 2015-02-12 — End: ?
  Filled 2015-02-12: qty 2

## 2015-02-12 SURGICAL SUPPLY — 28 items
BANDAGE ESMARK 4IN  STERILE LF (Dressings) ×2 IMPLANT
BANDAGE KLING 2 STERILE (Dressings) ×2 IMPLANT
BLADE SCALPEL #15 (Supply) ×4 IMPLANT
CANNISTER SUCTION 3000C (Supply) IMPLANT
CHLORAPREP W/ORANGE TINT 26ML (Supply) ×2 IMPLANT
COVER LIGHT HANDLE (ORTHO) (Supply) ×4 IMPLANT
CUFF TOURNIQUET 18 (Supply) IMPLANT
CUFF TOURNIQUET 24 (Supply) IMPLANT
DRAIN PENROSE 1/4 X 18 LF (TDC (Tubes, Draines, Catheters)) ×1
DRAIN PENROSE 1/4 X 18 LF (TDC (Tubes, Drains, Catheters)) ×1 IMPLANT
DRAPE MINI C-ARM (Supply) ×2 IMPLANT
DRSG XEROFORM 1 X 8 (Dressings) ×2 IMPLANT
GLOVE BIOGEL UND PI IND SZ 8 (Supply) ×2 IMPLANT
GOWN IMPERV 2X W/TOWEL REUSE (Supply) IMPLANT
GOWN STD LG W/TOWEL REUSE (Supply) ×4 IMPLANT
NDL HYPO 25GA X 1.5 (Supply) ×2 IMPLANT
PACK EXTREMITY-LF (Supply) ×2 IMPLANT
PAD ARMBOARD 20 X 8 (Supply) ×2 IMPLANT
SCRUB DYNA-HEX 4% CHG (Supply) ×2 IMPLANT
SLEEVE ARM DISP SINGLE-USE (Supply) ×2 IMPLANT
SOL SALINE IRRIG 500ML (Solutions) ×2 IMPLANT
SUT CHROMIC GUT 4-0 1654G (Supply) ×2 IMPLANT
SUT ETHIBOND 4-0 X695G (Supply) ×2 IMPLANT
SYRINGE 10CC LL (Supply) ×2 IMPLANT
SYRINGE 12CC CONTROL LL (Supply) ×2 IMPLANT
TOWEL (FAN-FOLD) 6/PK (Supply) ×4 IMPLANT
TRAY SKIN PREP  DRY (Supply) ×2 IMPLANT
UNDERPAD BLUE 23X36  LF (Supply) ×2 IMPLANT

## 2015-02-12 NOTE — H&P (Signed)
The patient was visited at the bedside to review his H&P preparation for surgery. He reports that he bumped the finger on his car about a week ago and this caused purulence to express from underneath the nail fold. He denies any increasing pain lately. On exam there is no current drainage of purulence but there is granulation tissue at the nail fold. I recommend that it is not reasonable to proceed with arthrodesis of the distal interphalangeal joint at this time with the concern of active infection which may be only of the paronychia or this may represent recurrence of distal interphalangeal joint sepsis. The joint itself on exam is not irritable and there is no evidence of effusion but there is moderate swelling of the entire finger. I recommend that he see his infectious disease consultant to discuss this development. He has been on doxycycline still for the infection. I recommend that he see me next week in the office to review the history of this problem and to discuss options. We did discuss the option of amputation in order to eradicate the infection if it is recurrence of distal interphalangeal joint infection and there may be osteomyelitis as well. He voices understanding and agrees with this plan.

## 2015-02-12 NOTE — Anesthesia Preprocedure Evaluation (Signed)
PSS Anesthesia Comments: Cancelled Case.  Patient shut operative finger in car trunk 1 week prior.  Here in Pre-op had swelling, pus and scab at nail / skin     Pre-evaluation Note Incomplete - DO NOT USE FOR CLINICAL DECISIONS    Anesthesia Plan

## 2015-02-20 ENCOUNTER — Ambulatory Visit: Payer: BC Managed Care – PPO

## 2015-02-26 ENCOUNTER — Ambulatory Visit: Payer: BC Managed Care – PPO | Admitting: Hand Surgery

## 2015-02-26 ENCOUNTER — Ambulatory Visit: Payer: BC Managed Care – PPO | Admitting: Anesthesiology

## 2015-02-26 ENCOUNTER — Encounter
Admission: RE | Disposition: A | Discharge status: Home / Self Care | Payer: Self-pay | Source: Home / Self Care | Attending: Hand Surgery

## 2015-02-26 ENCOUNTER — Ambulatory Visit
Admission: RE | Admit: 2015-02-26 | Discharge: 2015-02-26 | Disposition: A | Discharge status: Home / Self Care | Payer: BC Managed Care – PPO | Attending: Hand Surgery | Admitting: Hand Surgery

## 2015-02-26 DIAGNOSIS — Z8673 Personal history of transient ischemic attack (TIA), and cerebral infarction without residual deficits: Secondary | ICD-10-CM | POA: Insufficient documentation

## 2015-02-26 DIAGNOSIS — Z955 Presence of coronary angioplasty implant and graft: Secondary | ICD-10-CM | POA: Insufficient documentation

## 2015-02-26 DIAGNOSIS — I10 Essential (primary) hypertension: Secondary | ICD-10-CM | POA: Insufficient documentation

## 2015-02-26 DIAGNOSIS — Z7984 Long term (current) use of oral hypoglycemic drugs: Secondary | ICD-10-CM | POA: Insufficient documentation

## 2015-02-26 DIAGNOSIS — E119 Type 2 diabetes mellitus without complications: Secondary | ICD-10-CM | POA: Insufficient documentation

## 2015-02-26 DIAGNOSIS — I2510 Atherosclerotic heart disease of native coronary artery without angina pectoris: Secondary | ICD-10-CM | POA: Insufficient documentation

## 2015-02-26 DIAGNOSIS — E785 Hyperlipidemia, unspecified: Secondary | ICD-10-CM | POA: Insufficient documentation

## 2015-02-26 DIAGNOSIS — M13142 Monoarthritis, not elsewhere classified, left hand: Secondary | ICD-10-CM | POA: Insufficient documentation

## 2015-02-26 DIAGNOSIS — Z7982 Long term (current) use of aspirin: Secondary | ICD-10-CM | POA: Insufficient documentation

## 2015-02-26 DIAGNOSIS — K219 Gastro-esophageal reflux disease without esophagitis: Secondary | ICD-10-CM | POA: Insufficient documentation

## 2015-02-26 DIAGNOSIS — Z981 Arthrodesis status: Secondary | ICD-10-CM | POA: Insufficient documentation

## 2015-02-26 DIAGNOSIS — I252 Old myocardial infarction: Secondary | ICD-10-CM | POA: Insufficient documentation

## 2015-02-26 DIAGNOSIS — Z794 Long term (current) use of insulin: Secondary | ICD-10-CM | POA: Insufficient documentation

## 2015-02-26 HISTORY — PX: AMPUTATION, FINGER: SHX3035

## 2015-02-26 LAB — VH DEXTROSE STICK GLUCOSE: Glucose POCT: 278 mg/dL — ABNORMAL HIGH (ref 70–99)

## 2015-02-26 SURGERY — AMPUTATION, FINGER
Anesthesia: Anesthesia MAC / Sedation | Site: Finger | Laterality: Left | Wound class: Dirty or Infected

## 2015-02-26 MED ORDER — CLINDAMYCIN PHOSPHATE IN D5W 600 MG/50ML IV SOLN
INTRAVENOUS | Status: DC | PRN
Start: 2015-02-26 — End: 2015-02-26
  Administered 2015-02-26: 900 mg via INTRAVENOUS

## 2015-02-26 MED ORDER — PROPOFOL 10 MG/ML IV EMUL (WRAP)
INTRAVENOUS | Status: AC
Start: 2015-02-26 — End: ?
  Filled 2015-02-26: qty 20

## 2015-02-26 MED ORDER — LIDOCAINE HCL (PF) 1 % IJ SOLN
INTRAMUSCULAR | Status: AC
Start: 2015-02-26 — End: ?
  Filled 2015-02-26: qty 30

## 2015-02-26 MED ORDER — ACETAMINOPHEN 10 MG/ML IV SOLN
INTRAVENOUS | Status: DC | PRN
Start: 2015-02-26 — End: 2015-02-26
  Administered 2015-02-26: 1000 mg via INTRAVENOUS

## 2015-02-26 MED ORDER — SODIUM BICARBONATE 8.4 % IV SOLN
INTRAVENOUS | Status: DC | PRN
Start: 2015-02-26 — End: 2015-02-26
  Administered 2015-02-26: 1 mL

## 2015-02-26 MED ORDER — PROPOFOL 10 MG/ML IV EMUL (WRAP)
INTRAVENOUS | Status: DC | PRN
Start: 2015-02-26 — End: 2015-02-26
  Administered 2015-02-26: 20 mg via INTRAVENOUS
  Administered 2015-02-26: 10 mg via INTRAVENOUS
  Administered 2015-02-26 (×2): 20 mg via INTRAVENOUS
  Administered 2015-02-26: 10 mg via INTRAVENOUS
  Administered 2015-02-26: 20 mg via INTRAVENOUS
  Administered 2015-02-26: 10 mg via INTRAVENOUS
  Administered 2015-02-26 (×4): 20 mg via INTRAVENOUS
  Administered 2015-02-26: 10 mg via INTRAVENOUS

## 2015-02-26 MED ORDER — FENTANYL CITRATE (PF) 50 MCG/ML IJ SOLN (WRAP)
INTRAMUSCULAR | Status: DC | PRN
Start: 2015-02-26 — End: 2015-02-26
  Administered 2015-02-26: 50 ug via INTRAVENOUS
  Administered 2015-02-26: 25 ug via INTRAVENOUS
  Administered 2015-02-26: 50 ug via INTRAVENOUS

## 2015-02-26 MED ORDER — LACTATED RINGERS IV SOLN
INTRAVENOUS | Status: DC | PRN
Start: 2015-02-26 — End: 2015-02-26

## 2015-02-26 MED ORDER — CLINDAMYCIN PHOSPHATE IN D5W 900 MG/50ML IV SOLN
900.0000 mg | INTRAVENOUS | Status: DC
Start: 2015-02-26 — End: 2015-02-26
  Filled 2015-02-26: qty 50

## 2015-02-26 MED ORDER — SODIUM BICARBONATE 8.4 % IV SOLN
INTRAVENOUS | Status: AC
Start: 2015-02-26 — End: ?
  Filled 2015-02-26: qty 50

## 2015-02-26 MED ORDER — LACTATED RINGERS IV SOLN
INTRAVENOUS | Status: DC
Start: 2015-02-26 — End: 2015-02-26

## 2015-02-26 MED ORDER — FENTANYL CITRATE (PF) 50 MCG/ML IJ SOLN (WRAP)
INTRAMUSCULAR | Status: AC
Start: 2015-02-26 — End: ?
  Filled 2015-02-26: qty 5

## 2015-02-26 MED ORDER — LIDOCAINE HCL 1 % IJ SOLN
INTRAMUSCULAR | Status: DC | PRN
Start: 2015-02-26 — End: 2015-02-26
  Administered 2015-02-26: 10 mL via INTRADERMAL

## 2015-02-26 SURGICAL SUPPLY — 29 items
ARMBOARD FOAM 18 LONG (Supply) ×2 IMPLANT
BANDAGE ESMARK 4IN  STERILE LF (Dressings) ×1 IMPLANT
BANDAGE KLING 2 STERILE (Dressings) ×3 IMPLANT
BANDAGE SOF BAND KERLIX 4.5 IN (Dressings) ×1 IMPLANT
BLADE SAG FINE 25X9.5MM (Supply) ×2 IMPLANT
BLADE SCALPEL #15 (Supply) ×4 IMPLANT
CANNISTER SUCTION 3000C (Supply) ×2 IMPLANT
CHLORAPREP W/ORANGE TINT 26ML (Supply) ×4 IMPLANT
CONTAINER STER SPEC PEEL/PACK (Supply) ×1 IMPLANT
COVER LIGHT HANDLE (ORTHO) (Supply) ×4 IMPLANT
CUFF TOURNIQUET 18 (Supply) ×2 IMPLANT
DRSG XEROFORM 1 X 8 (Dressings) ×2 IMPLANT
GLOVE SURGEON SYN P/F SZ 8 (Supply) ×2 IMPLANT
GOWN STD LG W/TOWEL REUSE (Supply) ×8 IMPLANT
NDL 18GA X 1.5 SAFETYGLIDE (Supply) ×1 IMPLANT
NDL 25G X 1 1/4 (Supply) ×2 IMPLANT
NDL BLUNT FILL 18GA X 1.5 (Supply) ×1 IMPLANT
PACK EXTREMITY-LF (Supply) ×2 IMPLANT
SHEET DRAPE MEDIUM (Supply) ×1 IMPLANT
SHEET EXTREMITY T DRAPE (Supply) ×1 IMPLANT
SLEEVE ARM DISP SINGLE-USE (Supply) ×2 IMPLANT
SOL SALINE IRRIG 500ML (Solutions) ×3 IMPLANT
SOL WATER STERILE IRRG 500ML (Solutions) ×1 IMPLANT
SUT CHROMIC GUT 4-0 1654G (Supply) ×2 IMPLANT
SYRINGE 10CC LL (Supply) ×3 IMPLANT
SYRINGE 12CC CONTROL LL (Supply) ×1 IMPLANT
TOWEL (FAN-FOLD) 6/PK (Supply) ×4 IMPLANT
TRAY SKIN PREP  DRY (Supply) ×2 IMPLANT
UNDERPAD BLUE 23X36  LF (Supply) ×2 IMPLANT

## 2015-02-26 NOTE — Anesthesia Postprocedure Evaluation (Signed)
Anesthesia Post Evaluation    Patient: Michael [name]    Procedures performed: Procedure(s) with comments:  AMPUTATION, FINGER - Amputation left long finger    Anesthesia type: MAC    Patient location:Outpatient    Last vitals:   Filed Vitals:    02/26/15 1047   BP: 105/51   Pulse: 71   Temp: 36.2 C (97.2 F)   Resp: 16   SpO2: 98%       Post pain: Patient not complaining of pain, continue current therapy      Mental Status:awake    Respiratory Function: tolerating room air    Cardiovascular: stable    Nausea/Vomiting: patient not complaining of nausea or vomiting    Hydration Status: adequate    Post assessment: no apparent anesthetic complications

## 2015-02-26 NOTE — Discharge Instructions (Addendum)
DISCHARGE INSTRUCTIONS FOR HAND AND ARM SURGERY-ORTHOPEDICS    POST ANESTHESIA:  . Although you will be awake and alert in the Recovery Room, small amounts of sedation will remain in your body for at least 24 hours, and you may feel tired and sleepy for the next 24 hours. You are advised to go directly home, take it easy and rest as much as possible.  . It is advisable to have someone with you at home for the remainder of the day.  . Children should not be left unattended for the next 24 hours.      FOR THE NEXT 24 HOURS:  . DO NOT operate a motor vehicle or any mechanical or electrical equipment.  . DO NOT make important decisions or sign legal documents.  . DO NOT consume alcohol, tranquilizers, sleeping medications, or any non-prescribed medication.    DIET:  . Begin with liquids and progress to soft food. Progress to your normal diet if not nauseated. Nausea and vomiting may occur in the first 24 hrs.    ACTIVITY:  . You are advised to go directly home from the hospital.  . Restrict your activities and rest for a day. Resume activity tomorrow as allowed by your surgeon.  . DO NOT engage in strenuous activity that may place stress on your incision.  . Keep operative extremity elevated as much as possible to lessen swelling and discomfort.  . Wiggle fingers frequently to aid in circulation.    MEDICATIONS:  . When taking pain medications, you may experience dizziness or drowsiness.  Do not drive when you are taking these medications.  Remember, pain medications may cause constipation.  . You had _____________________in the Recovery Room at __________________.     DRESSINGS AND WOUND CARE:  . Keep the dressings clean and dry. remove in  4 Days  . Apply ice bag to operative area for 20 minutes every hour (while you are awake) for the next 24 to 48 hours.  . Elevate the operative extremity to reduce pain and swelling.  . While any part of the affected extremity is still numb due to the anesthesia block, be  conscientious about avoiding injury. (Avoid contact with hot or sharp objects.) Wear arm sling, if indicated.    IF YOU HAVE ANY OF THE FOLLOWING, PLEASE CONTACT YOUR PHYSICIAN:         1. Pain not controlled by the medication prescribed for you.         2. Unexplained fever.         3. Bleeding greater than spots from affected area.         4. Increased redness, warmth, hardness around the operative area.         5. Change in sensation, color or movement of affected extremity. Your fingers      should be warm, pink, moving and with normal sensation. (Compare to opposite extremity.)          6. Other unexplained symptoms, problems or concerns.        7.  If you experience redness or swelling at the IV insertion site that continues or worsens, please have it evaluated by your doctor.    The [facility] staff who cared for you today would like to wish you a quick and comfortable recovery.  For any questions or concerns about the care you received, please contact us at [phone] Monday-Friday from 8:30-5:00pm.  Please direct any medical questions to your surgeon.    IF YOU   NEED IMMEDIATE ATTENTION AND YOUR SURGEON IS NOT AVAILABLE, COME TO [facility] EMERGENCY DEPARTMENT OR CALL [phone].

## 2015-02-26 NOTE — H&P (Signed)
I have reviewed the H&P, examined the patient and there are no changes.

## 2015-02-26 NOTE — Transfer of Care (Signed)
Anesthesia Transfer of Care Note    Patient: Michael [name]    Last vitals:   Filed Vitals:    02/26/15 0937   BP: 94/59   Pulse: 64   Temp: 36 C (96.8 F)   Resp: 14   SpO2: 99%       Oxygen: Room Air     Mental Status:awake    Airway: Natural    Cardiovascular Status:  stable
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
## 2015-02-26 NOTE — Op Note (Signed)
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 Risk, benefits, and alternatives of operative and nonoperative management were discussed and after counseling he agrees with the plan for amputation through the phalanx.    Operative Notes:   The patient was positively identified in the waiting area and the operative extremity was signed. He was taken to the operative room and placed supine on the table with the left upper extremity on a hand table. IV sedation was administered. A tourniquet was placed around the left proximal arm and then the left upper extremity was prepped and draped in standard fashion. Local aesthetic was used to anesthetize the operative site. Final surgical timeout was performed prior to surgery. A transverse circumferential incision was made to bone at the mid portion of the middle phalanx and a sagittal saw was used to cut through the bone at this level. All instruments were passed off the field and a new drape was placed and gowns and gloves were changed. Further bone was removed with a rongeur while smoothing sharp edges. The flexor digitorum profundus was pulled distally and incised and then allowed to retract into soft tissues. The neurovascular bundles were dissected approximately 1 cm and  incised and allowed to retract into soft tissues. Before retraction the vascular bundles were cauterized with Bovie. The 2 tails of the flexor digitorum superficialis were sutured to a 4 pulley. The skin was modified and fishmouth fashion for smooth closure with no tension using 4-0 chromic suture. Xeroform and sterile dressing were applied. The patient tolerated the procedure well and was taken to the recovery room in stable condition.    Estimated Blood Loss:    * No values recorded between 02/26/2015  8:59 AM and 02/26/2015  9:35 AM 1 ml    Implants:   * No implants in log *    Drains:   Drains: no    Specimens:   none     OR Specimens:     ID Type Source Tests Collected by Time Destination   A : Left middle finger Amputation Finger (specify which finger and laterality) [facility] [place] [facility]Y Lujean Rave, MD 02/26/2015 [phone]        Complications:   * No complications entered in OR log *      Signed by: Lujean Rave, MD

## 2015-02-26 NOTE — Brief Op Note (Signed)
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

## 2015-03-02 ENCOUNTER — Encounter: Payer: Self-pay | Admitting: Hand Surgery

## 2015-05-20 ENCOUNTER — Inpatient Hospital Stay (EMERGENCY_DEPARTMENT_HOSPITAL)
Admission: EM | Admit: 2015-05-20 | Discharge: 2015-05-20 | Disposition: A | Discharge status: Home / Self Care | Payer: BC Managed Care – PPO

## 2015-05-20 DIAGNOSIS — R079 Chest pain, unspecified: Secondary | ICD-10-CM

## 2015-06-01 ENCOUNTER — Inpatient Hospital Stay (EMERGENCY_DEPARTMENT_HOSPITAL)
Admission: EM | Admit: 2015-06-01 | Discharge: 2015-06-01 | Disposition: A | Discharge status: Home / Self Care | Payer: BC Managed Care – PPO

## 2015-06-01 DIAGNOSIS — R079 Chest pain, unspecified: Secondary | ICD-10-CM

## 2015-06-07 ENCOUNTER — Inpatient Hospital Stay (EMERGENCY_DEPARTMENT_HOSPITAL)
Admission: EM | Admit: 2015-06-07 | Discharge: 2015-06-07 | Disposition: A | Discharge status: Home / Self Care | Payer: BC Managed Care – PPO

## 2015-06-07 DIAGNOSIS — S39012A Strain of muscle, fascia and tendon of lower back, initial encounter: Secondary | ICD-10-CM

## 2015-06-07 DIAGNOSIS — W010XXA Fall on same level from slipping, tripping and stumbling without subsequent striking against object, initial encounter: Secondary | ICD-10-CM

## 2015-06-30 ENCOUNTER — Ambulatory Visit (INDEPENDENT_AMBULATORY_CARE_PROVIDER_SITE_OTHER): Payer: BC Managed Care – PPO

## 2015-07-06 ENCOUNTER — Other Ambulatory Visit: Payer: Self-pay | Admitting: Internal Medicine

## 2015-07-06 DIAGNOSIS — F32A Depression, unspecified: Secondary | ICD-10-CM

## 2015-07-06 DIAGNOSIS — G459 Transient cerebral ischemic attack, unspecified: Secondary | ICD-10-CM

## 2015-07-06 DIAGNOSIS — I159 Secondary hypertension, unspecified: Secondary | ICD-10-CM

## 2015-07-06 DIAGNOSIS — I519 Heart disease, unspecified: Secondary | ICD-10-CM

## 2015-07-07 ENCOUNTER — Telehealth: Payer: Self-pay | Admitting: Internal Medicine

## 2015-07-07 ENCOUNTER — Ambulatory Visit (INDEPENDENT_AMBULATORY_CARE_PROVIDER_SITE_OTHER): Payer: BC Managed Care – PPO

## 2015-07-30 ENCOUNTER — Other Ambulatory Visit: Payer: Self-pay | Admitting: Neurological Surgery

## 2015-07-30 ENCOUNTER — Inpatient Hospital Stay
Admission: RE | Admit: 2015-07-30 | Discharge: 2015-07-30 | Disposition: A | Discharge status: Home / Self Care | Payer: BC Managed Care – PPO | Source: Ambulatory Visit | Attending: Neurological Surgery | Admitting: Neurological Surgery

## 2015-07-30 DIAGNOSIS — R52 Pain, unspecified: Secondary | ICD-10-CM

## 2015-08-15 ENCOUNTER — Inpatient Hospital Stay (EMERGENCY_DEPARTMENT_HOSPITAL)
Admission: EM | Admit: 2015-08-15 | Discharge: 2015-08-15 | Disposition: A | Discharge status: Home / Self Care | Payer: BC Managed Care – PPO

## 2015-08-15 DIAGNOSIS — R079 Chest pain, unspecified: Secondary | ICD-10-CM

## 2015-08-20 ENCOUNTER — Ambulatory Visit
Admission: RE | Admit: 2015-08-20 | Discharge: 2015-08-20 | Disposition: A | Discharge status: Home / Self Care | Payer: BC Managed Care – PPO | Source: Ambulatory Visit | Attending: Surgery | Admitting: Surgery

## 2015-08-20 ENCOUNTER — Other Ambulatory Visit: Payer: Self-pay | Admitting: Surgery

## 2015-08-20 ENCOUNTER — Ambulatory Visit
Admission: RE | Admit: 2015-08-20 | Discharge: 2015-08-20 | Disposition: A | Discharge status: Home / Self Care | Payer: BC Managed Care – PPO | Source: Ambulatory Visit | Attending: Internal Medicine | Admitting: Internal Medicine

## 2015-08-20 DIAGNOSIS — I159 Secondary hypertension, unspecified: Secondary | ICD-10-CM

## 2015-08-20 DIAGNOSIS — G459 Transient cerebral ischemic attack, unspecified: Secondary | ICD-10-CM

## 2015-08-20 DIAGNOSIS — M25552 Pain in left hip: Secondary | ICD-10-CM | POA: Insufficient documentation

## 2015-08-20 DIAGNOSIS — F32A Depression, unspecified: Secondary | ICD-10-CM

## 2015-08-20 DIAGNOSIS — G4730 Sleep apnea, unspecified: Secondary | ICD-10-CM | POA: Insufficient documentation

## 2015-08-20 DIAGNOSIS — I519 Heart disease, unspecified: Secondary | ICD-10-CM

## 2015-08-21 NOTE — Progress Notes (Signed)
PSG was performed by Sid Falcon, RPSGT.

## 2015-08-22 NOTE — Procedures (Signed)
TOTAL RECORDING TIME: 486 minutes.    TOTAL SLEEP TIME: 454 minutes.    FINDINGS: Sleep onset is normal at 8.7 minutes.  Sleep  efficiency is normal at 93%.  There is increased stage 1 sleep.  There is normal stage 2 sleep.  Slow-wave sleep is severely  reduced at 4%.  REM sleep is increased at 34%.  REM onset is  normal at 86 minutes.  The patient slept on back and both sides.  Loud snoring was noted.  No significant cardiac abnormalities  seen. 5.1 minutes or less than 89%.  Minimum sat was 76%.  Severe periodic limb movements were seen; however, they were not  associated with arousals.  During the study, 166 respiratory  events were seen.  Three of those were central events.  The AHI  is 21.  The RDI is also 21.    IMPRESSION: Moderate sleep apnea with significant desaturations.    RECOMMENDATION: For the patient to undergo a full night CPAP  titration for the moderate sleep apnea with desaturations.        [id number]  DD: 08/22/2015 95:62:13  DT: 08/22/2015 08:65:78  JOB: [id number]

## 2015-09-07 ENCOUNTER — Inpatient Hospital Stay (EMERGENCY_DEPARTMENT_HOSPITAL)
Admission: EM | Admit: 2015-09-07 | Discharge: 2015-09-07 | Disposition: A | Discharge status: Home / Self Care | Payer: No Typology Code available for payment source

## 2015-09-07 DIAGNOSIS — S39012A Strain of muscle, fascia and tendon of lower back, initial encounter: Secondary | ICD-10-CM

## 2015-09-07 DIAGNOSIS — W109XXA Fall (on) (from) unspecified stairs and steps, initial encounter: Secondary | ICD-10-CM

## 2015-09-07 DIAGNOSIS — G8911 Acute pain due to trauma: Secondary | ICD-10-CM

## 2015-09-10 ENCOUNTER — Inpatient Hospital Stay (EMERGENCY_DEPARTMENT_HOSPITAL)
Admission: EM | Admit: 2015-09-10 | Discharge: 2015-09-10 | Disposition: A | Discharge status: Home / Self Care | Payer: BC Managed Care – PPO

## 2015-09-10 DIAGNOSIS — H539 Unspecified visual disturbance: Secondary | ICD-10-CM

## 2015-09-10 DIAGNOSIS — E1100 Type 2 diabetes mellitus with hyperosmolarity without nonketotic hyperglycemic-hyperosmolar coma (NKHHC): Secondary | ICD-10-CM

## 2015-09-13 ENCOUNTER — Inpatient Hospital Stay (EMERGENCY_DEPARTMENT_HOSPITAL)
Admission: EM | Admit: 2015-09-13 | Discharge: 2015-09-13 | Disposition: A | Discharge status: Home / Self Care | Payer: BC Managed Care – PPO

## 2015-09-13 DIAGNOSIS — L03011 Cellulitis of right finger: Secondary | ICD-10-CM

## 2015-09-15 ENCOUNTER — Emergency Department: Payer: Self-pay

## 2015-09-15 ENCOUNTER — Emergency Department
Admission: EM | Admit: 2015-09-15 | Discharge: 2015-09-15 | Disposition: A | Discharge status: Home / Self Care | Payer: Worker's Comp, Other unspecified | Attending: Emergency Medicine | Admitting: Emergency Medicine

## 2015-09-15 DIAGNOSIS — W1839XA Other fall on same level, initial encounter: Secondary | ICD-10-CM | POA: Insufficient documentation

## 2015-09-15 DIAGNOSIS — M79644 Pain in right finger(s): Secondary | ICD-10-CM | POA: Insufficient documentation

## 2015-09-15 MED ORDER — SULFAMETHOXAZOLE-TRIMETHOPRIM 800-160 MG PO TABS
ORAL_TABLET | ORAL | Status: AC
Start: 2015-09-15 — End: ?
  Filled 2015-09-15: qty 1

## 2015-09-15 MED ORDER — SULFAMETHOXAZOLE-TRIMETHOPRIM 800-160 MG PO TABS
2.0000 | ORAL_TABLET | Freq: Two times a day (BID) | ORAL | Status: AC
Start: 2015-09-15 — End: 2015-09-22

## 2015-09-15 MED ORDER — MUPIROCIN 2 % EX OINT
TOPICAL_OINTMENT | Freq: Three times a day (TID) | CUTANEOUS | Status: DC
Start: 2015-09-15 — End: 2016-04-27

## 2015-09-15 MED ORDER — SULFAMETHOXAZOLE-TRIMETHOPRIM 800-160 MG PO TABS
2.0000 | ORAL_TABLET | Freq: Once | ORAL | Status: AC
Start: 2015-09-15 — End: 2015-09-15
  Administered 2015-09-15: 2 via ORAL

## 2015-09-15 NOTE — ED Provider Notes (Signed)
[facility] EMERGENCY DEPARTMENT History and Physical Exam      Patient Name: Michael [name], Michael [name]  Encounter Date:  09/15/2015  Attending Physician: Wynona Neat, DO  PCP: Myrtie Soman, DO  Patient DOB:  1955-11-22  MRN:  [id number]  Room:  RAUC/RAU-C      History of Presenting Illness     Chief complaint: Hand Pain    HPI/ROS is limited by: none  HPI/ROS given by: patient    Location: RIGHT THUMB  Duration: 1 WEEK    Michael [name] is a 60 y.o. male who presents with RIGHT THUMB PAIN X1 WEEK. PATIENT STATES HE FELL AND JAMMED THUMB 1 WEEK PRIOR WITH INCREASED PAIN AND REDNESS X1 WEEK. 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 NO FEVER OR CHILLS. NO MOTOR SENSORY COMPLAINTS. NO CHEST PAIN OR SOB. NO ABDOMINAL PAIN OR NVD. NO FURTHER CONSTITUTIONAL COMPLAINTS.     Review of Systems     Review of Systems   Constitutional: Negative.  Negative for fever, chills and fatigue.   HENT: Negative for congestion, ear pain and sore throat.    Eyes: Negative for photophobia, pain and redness.   Respiratory: Negative for cough, chest tightness, shortness of breath and wheezing.    Cardiovascular: Negative for chest pain, palpitations and leg swelling.   Gastrointestinal: Negative for nausea, vomiting, abdominal pain, diarrhea and constipation.   Genitourinary: Negative for dysuria, hematuria and flank pain.   Musculoskeletal: Positive for myalgias. Negative for arthralgias and neck pain.   Skin: Positive for wound. Negative for rash.   Neurological: Negative for dizziness, weakness, light-headedness, numbness and headaches.   Hematological: Negative for adenopathy.   Psychiatric/Behavioral: The patient is not nervous/anxious.    All other systems reviewed and are negative.     Allergies     Pt is allergic to gabapentin; glucophage; keflex; and morphine.    Medications     Current Outpatient Rx   Name  Route  Sig  Dispense  Refill   . acetaminophen (TYLENOL) 325 MG tablet    Oral    Take 2 tablets (650 mg total) by mouth  every 4 (four) hours as needed for Fever (temperature greater than 38.5 C).    40 tablet    0     . aspirin 81 MG chewable tablet    Oral    Chew 1 tablet (81 mg total) by mouth daily.    90 tablet    0     . atorvastatin (LIPITOR) 10 MG tablet    Oral    Take 4 tablets (40 mg total) by mouth nightly.  Patient taking differently: Take 40 mg by mouth daily.       30 tablet    3     . doxycycline (VIBRAMYCIN) 100 MG capsule    Oral    Take 100 mg by mouth daily.             . DULoxetine (CYMBALTA) 30 MG capsule    Oral    Take 30 mg by mouth daily.             . fenofibrate (TRICOR) 48 MG tablet    Oral    Take 2 tablets (96 mg total) by mouth daily.    30 tablet    3     . glipiZIDE (GLUCOTROL) 10 MG tablet    Oral    Take 10 mg by mouth 2 (two) times daily before meals.             Marland Kitchen  hydrochlorothiazide (HYDRODIURIL) 12.5 MG tablet    Oral    Take 12.5 mg by mouth daily.             . hydrOXYzine (VISTARIL) 50 MG capsule    Oral    Take 1 capsule (50 mg total) by mouth nightly.    30 capsule    0     . insulin aspart (NOVOLOG) 100 UNIT/ML injection pen    Subcutaneous    Inject 1-5 Units into the skin 4 times daily - with meals and at bedtime.    15 mL    0     . insulin glargine (LANTUS SOLOSTAR) 100 UNIT/ML injection pen    Subcutaneous    Inject 10 Units into the skin nightly.    15 mL    0     . isosorbide mononitrate (IMDUR) 30 MG 24 hr tablet    Oral    Take 30 mg by mouth daily.              Marland Kitchen METOPROLOL TARTRATE PO    Oral    Take 100 mg by mouth 2 (two) times daily.             . mupirocin (BACTROBAN) 2 % ointment    Topical    Apply topically every 8 (eight) hours.    15 g    0     . nitroglycerin (NITROSTAT) 0.4 MG SL tablet    Sublingual    Place 0.4 mg under the tongue every 5 (five) minutes as needed.             Marland Kitchen oxyCODONE-acetaminophen (PERCOCET) 7.5-325 MG per tablet    Oral    Take 1 tablet by mouth every 4 (four) hours as needed.    15 tablet    0     . prasugrel (EFFIENT) 5 MG Tab    Oral     Take 2 tablets (10 mg total) by mouth daily.    30 tablet    11     . ranitidine (ZANTAC) 300 MG tablet    Oral    Take 300 mg by mouth daily.             . sitaGLIPtin (JANUVIA) 100 MG tablet    Oral    Take 100 mg by mouth daily.             Marland Kitchen sulfamethoxazole-trimethoprim (BACTRIM DS,SEPTRA DS) 800-160 MG per tablet    Oral    Take 2 tablets by mouth 2 (two) times daily.    28 tablet    0     . traMADol (ULTRAM) 50 MG tablet    Oral    Take 50 mg by mouth every 8 (eight) hours as needed for Pain.                  Past Medical History     Pt has a past medical history of Gastroesophageal reflux disease; Diabetes mellitus; Hyperlipidemia; Hypertension; TIA (transient ischemic attack); CAD (coronary artery disease); Myocardial infarction; Hernia; Presence of stent in coronary artery; Neuropathy; and Cellulitis of finger of left hand (10/22/14).    Past Surgical History     Pt has past surgical history that includes Coronary angioplasty with stent; Cervical fusion; Lumbar laminectomy; Coronary angioplasty with stent (12/13/12); CIRCUMCISION, > 13 YEARS, ADULT (N/A, 09/08/2014); and AMPUTATION, FINGER (Left, 02/26/2015).    Family History     The family history includes  Diabetes in his mother and sister; Heart attack in his father and mother; Heart disease in his father and mother; Stroke in his father and mother.    Social History     Pt reports that he has never smoked. He has never used smokeless tobacco. He reports that he drinks alcohol. He reports that he does not use illicit drugs.    Physical Exam     Blood pressure 108/61, pulse 85, temperature 97.9 F (36.6 C), temperature source Oral, resp. rate 20, height 1.727 m, weight 95 kg, SpO2 100 %.    Physical Exam   Constitutional: He is oriented to person, place, and time. He appears well-developed and well-nourished. No distress.   HENT:   Head: Normocephalic and atraumatic.   Nose: Nose normal.   Mouth/Throat: Oropharynx is clear and moist.   Eyes: Conjunctivae  and EOM are normal. Pupils are equal, round, and reactive to light. No scleral icterus.   Neck: Normal range of motion. Neck supple. No tracheal deviation present. No thyromegaly present.   Cardiovascular: Normal rate, regular rhythm, normal heart sounds and intact distal pulses.    Pulmonary/Chest: Effort normal and breath sounds normal. He exhibits no tenderness.   LUNGS CLEAR TO AUSCULTATION   Abdominal: Soft. Bowel sounds are normal. There is no tenderness. There is no rebound and no guarding.   SOFT AND NON-TENDER   Musculoskeletal: Normal range of motion. He exhibits no edema.   SMALL WOUND TO THE MIDDLE EPONYCHIUM OR RIGHT THUMB DORSALLY. NO PURULENCE OR DEVITALIZED TISSUE. NO PERIWOUND ERYTHEMA OR WARMTH. NO CELLULITIS OR LYMPHANGITIS. NO ADENOPATHY. FULL ROM ALL JOINTS.    Lymphadenopathy:     He has no cervical adenopathy.   Neurological: He is alert and oriented to person, place, and time. He has normal reflexes.   Skin: Skin is warm and dry. No erythema.   Psychiatric: He has a normal mood and affect.   Nursing note and vitals reviewed.    Orders Placed     Orders Placed This Encounter   Procedures   . Wound Culture and Smear       Diagnostic Results       The results of the diagnostic studies below have been reviewed by myself:    Labs  Results     ** No results found for the last 24 hours. **          Radiologic Studies  Radiology Results (24 Hour)     ** No results found for the last 24 hours. **        MDM and Progress     Blood pressure 108/61, pulse 85, temperature 97.9 F (36.6 C), temperature source Oral, resp. rate 20, height 1.727 m, weight 95 kg, SpO2 100 %.    Diagnostic Considerations:  1. FRACTURE  2. INFECTION  3. MYOFASCIAL PAIN    Critical Care and Procedures     NONE    Diagnosis / Disposition     Clinical Impression  1. Thumb pain, right        Disposition  ED Disposition     Discharge [name] discharge to home/self care.    Condition at disposition: Stable             Prescriptions  New Prescriptions    MUPIROCIN (BACTROBAN) 2 % OINTMENT    Apply topically every 8 (eight) hours.    SULFAMETHOXAZOLE-TRIMETHOPRIM (BACTRIM DS,SEPTRA DS) 800-160 MG PER TABLET    Take 2 tablets by mouth  2 (two) times daily.         Attestations     The documentation recorded by my scribe, Wilburn Mylar, accurately reflects the services I personally performed and the decisions made by me.  Wynona Neat, DO        Carlena Sax T, DO  09/15/15 1336

## 2015-09-15 NOTE — Discharge Instructions (Signed)
WASH WITH SOAP AND WATER TWICE DAILY. BACTROBAN TO WOUND. TYLENOL OR MOTRIN AS NEEDED FOR PAIN.       Myalgias  Myalgias are another word formuscle aches and soreness.This is a symptom, not a disease.Myalgias can have many causes.A cold, the flu,or an acute infection can cause them.So can any illness with a high fever.They may happen afterexertion (such as heavy exercise) or trauma (such as an accident or fall).Some medicines (such as statins and certain antidepressants) can cause myalgias.They can also be a symptom of chronic or ongoing medical problems (such as lupus,chronic fatigue, or hypothyroidism). With these illnesses, other serious symptoms often occur in addition to muscle pain and soreness.  Myalgias most often go away on their own. If they don't go away, come back, or are severe, testing may be needed to help find the cause.  Home care   Rest until you feel better.   Follow instructions that you were given for how to care for yourself. This may depend on the cause of your myalgias.   If myalgia is thought to be due to a medicine, be sure to talk to the doctor that prescribed the medicine about the best course of action.   To control pain, take prescription or over-the-counter medicines as directed. Unless told not to,you can try acetaminophen or ibuprofen.  Follow-up care  Follow up with your healthcare provider or as advised by our staff. If your symptoms do not go away in a few days or if they come back, follow up with your healthcare provider for an exam and testing.  When to see medical advice  Call your healthcare provider for any of the following:   Fever of100.52F (38C)or higher, or as directed by your healthcare provider   Pain that gets worse and not better, or that goes away and comes back   New joint pains   New rash   Severe headache, neck pain, drowsiness, or confusion  Date Last Reviewed: 08/29/2013   2000-2016 [employer] [address] [place], [place] [address]. All rights reserved. This information is not intended as a substitute for professional medical care. Always follow your healthcare professional's instructions.

## 2015-10-10 ENCOUNTER — Inpatient Hospital Stay (EMERGENCY_DEPARTMENT_HOSPITAL)
Admission: EM | Admit: 2015-10-10 | Discharge: 2015-10-10 | Disposition: A | Discharge status: Home / Self Care | Payer: No Typology Code available for payment source

## 2015-10-10 DIAGNOSIS — S76312A Strain of muscle, fascia and tendon of the posterior muscle group at thigh level, left thigh, initial encounter: Secondary | ICD-10-CM

## 2015-11-11 ENCOUNTER — Inpatient Hospital Stay (EMERGENCY_DEPARTMENT_HOSPITAL)
Admission: EM | Admit: 2015-11-11 | Discharge: 2015-11-11 | Disposition: A | Discharge status: Home / Self Care | Payer: BC Managed Care – PPO

## 2015-11-11 DIAGNOSIS — R079 Chest pain, unspecified: Secondary | ICD-10-CM

## 2016-01-05 ENCOUNTER — Inpatient Hospital Stay (EMERGENCY_DEPARTMENT_HOSPITAL)
Admission: EM | Admit: 2016-01-05 | Discharge: 2016-01-05 | Disposition: A | Discharge status: Home / Self Care | Payer: BC Managed Care – PPO

## 2016-01-05 DIAGNOSIS — R079 Chest pain, unspecified: Secondary | ICD-10-CM

## 2016-01-15 ENCOUNTER — Inpatient Hospital Stay (EMERGENCY_DEPARTMENT_HOSPITAL)
Admission: EM | Admit: 2016-01-15 | Discharge: 2016-01-15 | Disposition: A | Discharge status: Home / Self Care | Payer: BC Managed Care – PPO

## 2016-01-15 DIAGNOSIS — S61412A Laceration without foreign body of left hand, initial encounter: Secondary | ICD-10-CM

## 2016-01-15 DIAGNOSIS — W010XXA Fall on same level from slipping, tripping and stumbling without subsequent striking against object, initial encounter: Secondary | ICD-10-CM

## 2016-01-15 DIAGNOSIS — T148 Other injury of unspecified body region: Secondary | ICD-10-CM

## 2016-01-15 DIAGNOSIS — S0181XA Laceration without foreign body of other part of head, initial encounter: Secondary | ICD-10-CM

## 2016-01-15 DIAGNOSIS — S61411A Laceration without foreign body of right hand, initial encounter: Secondary | ICD-10-CM

## 2016-01-15 DIAGNOSIS — Y929 Unspecified place or not applicable: Secondary | ICD-10-CM

## 2016-01-28 ENCOUNTER — Ambulatory Visit (INDEPENDENT_AMBULATORY_CARE_PROVIDER_SITE_OTHER): Payer: Worker's Compensation | Admitting: Occupational Medicine

## 2016-01-28 VITALS — BP 142/75 | HR 93 | Temp 97.1°F | Resp 16 | Ht 66.85 in | Wt 197.1 lb

## 2016-01-28 DIAGNOSIS — S76302A Unspecified injury of muscle, fascia and tendon of the posterior muscle group at thigh level, left thigh, initial encounter: Secondary | ICD-10-CM

## 2016-01-28 DIAGNOSIS — Z0000 Encounter for general adult medical examination without abnormal findings: Secondary | ICD-10-CM

## 2016-01-28 NOTE — Progress Notes (Signed)
Patient here for work-related exam.  See documentation in paper chart.    Natasha Bence, MD 01/28/2016, 14:32

## 2016-03-27 ENCOUNTER — Inpatient Hospital Stay (EMERGENCY_DEPARTMENT_HOSPITAL)
Admission: EM | Admit: 2016-03-27 | Discharge: 2016-03-27 | Disposition: A | Discharge status: Home / Self Care | Payer: BC Managed Care – PPO

## 2016-03-27 DIAGNOSIS — H60551 Acute reactive otitis externa, right ear: Secondary | ICD-10-CM

## 2016-04-26 ENCOUNTER — Inpatient Hospital Stay (EMERGENCY_DEPARTMENT_HOSPITAL)
Admission: EM | Admit: 2016-04-26 | Discharge: 2016-04-26 | Disposition: A | Discharge status: Home / Self Care | Payer: BC Managed Care – PPO

## 2016-04-26 DIAGNOSIS — R079 Chest pain, unspecified: Secondary | ICD-10-CM

## 2016-04-26 DIAGNOSIS — R200 Anesthesia of skin: Secondary | ICD-10-CM

## 2016-04-27 ENCOUNTER — Emergency Department: Payer: BC Managed Care – PPO

## 2016-04-27 ENCOUNTER — Observation Stay
Admission: EM | Admit: 2016-04-27 | Discharge: 2016-04-28 | Disposition: A | Discharge status: Home / Self Care | Payer: BC Managed Care – PPO | Attending: Emergency Medicine | Admitting: Emergency Medicine

## 2016-04-27 ENCOUNTER — Observation Stay: Payer: BC Managed Care – PPO

## 2016-04-27 ENCOUNTER — Observation Stay: Payer: BC Managed Care – PPO | Admitting: Emergency Medicine

## 2016-04-27 DIAGNOSIS — K219 Gastro-esophageal reflux disease without esophagitis: Secondary | ICD-10-CM | POA: Insufficient documentation

## 2016-04-27 DIAGNOSIS — Z8673 Personal history of transient ischemic attack (TIA), and cerebral infarction without residual deficits: Secondary | ICD-10-CM | POA: Insufficient documentation

## 2016-04-27 DIAGNOSIS — N400 Enlarged prostate without lower urinary tract symptoms: Secondary | ICD-10-CM | POA: Insufficient documentation

## 2016-04-27 DIAGNOSIS — R079 Chest pain, unspecified: Secondary | ICD-10-CM | POA: Diagnosis present

## 2016-04-27 DIAGNOSIS — Z885 Allergy status to narcotic agent status: Secondary | ICD-10-CM | POA: Insufficient documentation

## 2016-04-27 DIAGNOSIS — E119 Type 2 diabetes mellitus without complications: Secondary | ICD-10-CM

## 2016-04-27 DIAGNOSIS — Z794 Long term (current) use of insulin: Secondary | ICD-10-CM | POA: Insufficient documentation

## 2016-04-27 DIAGNOSIS — I10 Essential (primary) hypertension: Secondary | ICD-10-CM | POA: Insufficient documentation

## 2016-04-27 DIAGNOSIS — R0789 Other chest pain: Principal | ICD-10-CM | POA: Insufficient documentation

## 2016-04-27 DIAGNOSIS — E1142 Type 2 diabetes mellitus with diabetic polyneuropathy: Secondary | ICD-10-CM | POA: Insufficient documentation

## 2016-04-27 DIAGNOSIS — I252 Old myocardial infarction: Secondary | ICD-10-CM | POA: Insufficient documentation

## 2016-04-27 DIAGNOSIS — E785 Hyperlipidemia, unspecified: Secondary | ICD-10-CM | POA: Insufficient documentation

## 2016-04-27 DIAGNOSIS — R0602 Shortness of breath: Secondary | ICD-10-CM | POA: Insufficient documentation

## 2016-04-27 DIAGNOSIS — Z955 Presence of coronary angioplasty implant and graft: Secondary | ICD-10-CM | POA: Insufficient documentation

## 2016-04-27 DIAGNOSIS — Z7982 Long term (current) use of aspirin: Secondary | ICD-10-CM | POA: Insufficient documentation

## 2016-04-27 DIAGNOSIS — I2510 Atherosclerotic heart disease of native coronary artery without angina pectoris: Secondary | ICD-10-CM | POA: Insufficient documentation

## 2016-04-27 DIAGNOSIS — Z881 Allergy status to other antibiotic agents status: Secondary | ICD-10-CM | POA: Insufficient documentation

## 2016-04-27 LAB — ECG 12-LEAD
P Wave Axis: 97 deg
P-R Interval: 192 ms
Patient Age: 60 years
Q-T Interval(Corrected): 437 ms
Q-T Interval: 316 ms
QRS Axis: 38 deg
QRS Duration: 74 ms
T Axis: -5 years
Ventricular Rate: 115 //min

## 2016-04-27 LAB — CBC AND DIFFERENTIAL
Basophils %: 0.7 % (ref 0.0–3.0)
Basophils Absolute: 0 10*3/uL (ref 0.0–0.3)
Eosinophils %: 3.6 % (ref 0.0–7.0)
Eosinophils Absolute: 0.2 10*3/uL (ref 0.0–0.8)
Hematocrit: 43.3 % (ref 39.0–52.5)
Hemoglobin: 15.4 gm/dL (ref 13.0–17.5)
Lymphocytes Absolute: 1.6 10*3/uL (ref 0.6–5.1)
Lymphocytes: 28.4 % (ref 15.0–46.0)
MCH: 30 pg (ref 28–35)
MCHC: 36 gm/dL (ref 32–36)
MCV: 84 fL (ref 80–100)
MPV: 7 fL (ref 6.0–10.0)
Monocytes Absolute: 0.4 10*3/uL (ref 0.1–1.7)
Monocytes: 6.2 % (ref 3.0–15.0)
Neutrophils %: 61.1 % (ref 42.0–78.0)
Neutrophils Absolute: 3.5 10*3/uL (ref 1.7–8.6)
PLT CT: 181 10*3/uL (ref 130–440)
RBC: 5.16 10*6/uL (ref 4.00–5.70)
RDW: 12.1 % (ref 11.0–14.0)
WBC: 5.6 10*3/uL (ref 4.0–11.0)

## 2016-04-27 LAB — TROPONIN I
Troponin I: 0 ng/mL (ref 0.00–0.02)
Troponin I: <0.01 ng/mL (ref 0.00–0.02)
Troponin I: 0.01 ng/mL (ref 0.00–0.02)

## 2016-04-27 LAB — BASIC METABOLIC PANEL
Anion Gap: 8.5 mMol/L (ref 7.0–18.0)
BUN / Creatinine Ratio: 15.5 Ratio (ref 10.0–30.0)
BUN: 13 mg/dL (ref 7–22)
CO2: 28.8 mMol/L (ref 20.0–30.0)
Calcium: 9.2 mg/dL (ref 8.5–10.5)
Chloride: 100 mMol/L (ref 98–110)
Creatinine: 0.84 mg/dL (ref 0.80–1.30)
EGFR: 95 mL/min/{1.73_m2} (ref 60–150)
Glucose: 362 mg/dL — ABNORMAL HIGH (ref 70–99)
Osmolality Calc: 281 mOsm/kg (ref 275–300)
Potassium: 4.3 mMol/L (ref 3.5–5.3)
Sodium: 133 mMol/L — ABNORMAL LOW (ref 136–147)

## 2016-04-27 LAB — LIPID PANEL
Cholesterol: 115 mg/dL (ref 75–199)
Coronary Heart Disease Risk: 4.26
HDL: 27 mg/dL — ABNORMAL LOW (ref 40–55)
LDL Calculated: 22 mg/dL
Triglycerides: 329 mg/dL — ABNORMAL HIGH (ref 10–150)
VLDL: 66 — ABNORMAL HIGH (ref 0–40)

## 2016-04-27 LAB — D-DIMER, QUANTITATIVE: D-Dimer: <0.19 mg/L FEU — ABNORMAL LOW (ref 0.19–0.52)

## 2016-04-27 LAB — THYROID STIMULATING HORMONE (TSH), REFLEX ON ABNORMAL TO FREE T4, SERUM: TSH: 0.59 u[IU]/mL (ref 0.40–4.20)

## 2016-04-27 LAB — HEMOGLOBIN A1C: Hgb A1C, %: 13.3 %

## 2016-04-27 LAB — VH DEXTROSE STICK GLUCOSE: Glucose POCT: 225 mg/dL — ABNORMAL HIGH (ref 70–99)

## 2016-04-27 LAB — MAGNESIUM: Magnesium: 1.9 mg/dL (ref 1.6–2.6)

## 2016-04-27 MED ORDER — SODIUM CHLORIDE 0.9 % IV BOLUS
1000.0000 mL | Freq: Once | INTRAVENOUS | Status: AC
Start: 2016-04-27 — End: 2016-04-27
  Administered 2016-04-27: 1000 mL via INTRAVENOUS

## 2016-04-27 MED ORDER — GLUCAGON 1 MG IJ SOLR (WRAP)
1.0000 mg | INTRAMUSCULAR | Status: DC | PRN
Start: 2016-04-27 — End: 2016-04-28

## 2016-04-27 MED ORDER — FENOFIBRATE 160 MG PO TABS
160.0000 mg | ORAL_TABLET | Freq: Every morning | ORAL | Status: DC
Start: 2016-04-28 — End: 2016-04-28
  Administered 2016-04-28: 160 mg via ORAL
  Filled 2016-04-27: qty 1

## 2016-04-27 MED ORDER — SODIUM CHLORIDE 0.9 % IJ SOLN
0.4000 mg | INTRAMUSCULAR | Status: DC | PRN
Start: 2016-04-27 — End: 2016-04-28

## 2016-04-27 MED ORDER — ATORVASTATIN CALCIUM 40 MG PO TABS
40.0000 mg | ORAL_TABLET | Freq: Every evening | ORAL | Status: DC
Start: 2016-04-27 — End: 2016-04-28
  Administered 2016-04-27: 40 mg via ORAL
  Filled 2016-04-27 (×2): qty 1

## 2016-04-27 MED ORDER — NITROGLYCERIN 0.4 MG SL SUBL
0.4000 mg | SUBLINGUAL_TABLET | SUBLINGUAL | Status: AC
Start: 2016-04-27 — End: 2016-04-27
  Administered 2016-04-27: 0.4 mg via SUBLINGUAL

## 2016-04-27 MED ORDER — NITROGLYCERIN 2 % TD OINT
TOPICAL_OINTMENT | TRANSDERMAL | Status: AC
Start: 2016-04-27 — End: ?
  Filled 2016-04-27: qty 1

## 2016-04-27 MED ORDER — DEXTROSE 10 % IV BOLUS
125.0000 mL | INTRAVENOUS | Status: DC | PRN
Start: 2016-04-27 — End: 2016-04-28

## 2016-04-27 MED ORDER — SITAGLIPTIN PHOSPHATE 100 MG PO TABS
100.0000 mg | ORAL_TABLET | Freq: Every morning | ORAL | Status: DC
Start: 2016-04-28 — End: 2016-04-28
  Administered 2016-04-28: 100 mg via ORAL
  Filled 2016-04-27: qty 1

## 2016-04-27 MED ORDER — INSULIN ASPART 100 UNIT/ML SC SOPN
1.0000 [IU] | PEN_INJECTOR | Freq: Every evening | SUBCUTANEOUS | Status: DC | PRN
Start: 2016-04-27 — End: 2016-04-28

## 2016-04-27 MED ORDER — FAMOTIDINE 20 MG PO TABS
20.0000 mg | ORAL_TABLET | Freq: Two times a day (BID) | ORAL | Status: DC
Start: 2016-04-27 — End: 2016-04-28
  Administered 2016-04-27 – 2016-04-28 (×2): 20 mg via ORAL
  Filled 2016-04-27 (×2): qty 1

## 2016-04-27 MED ORDER — INSULIN ASPART 100 UNIT/ML SC SOPN
5.0000 [IU] | PEN_INJECTOR | Freq: Every day | SUBCUTANEOUS | Status: DC
Start: 2016-04-28 — End: 2016-04-28
  Administered 2016-04-28: 5 [IU] via SUBCUTANEOUS

## 2016-04-27 MED ORDER — HYDROXYZINE PAMOATE 25 MG PO CAPS
50.0000 mg | ORAL_CAPSULE | Freq: Every evening | ORAL | Status: DC
Start: 2016-04-27 — End: 2016-04-28
  Administered 2016-04-27: 50 mg via ORAL
  Filled 2016-04-27: qty 2

## 2016-04-27 MED ORDER — ONDANSETRON HCL 4 MG/2ML IJ SOLN
4.0000 mg | Freq: Three times a day (TID) | INTRAMUSCULAR | Status: DC | PRN
Start: 2016-04-27 — End: 2016-04-28

## 2016-04-27 MED ORDER — ASPIRIN 81 MG PO CHEW
CHEWABLE_TABLET | ORAL | Status: AC
Start: 2016-04-27 — End: ?
  Filled 2016-04-27: qty 4

## 2016-04-27 MED ORDER — DULOXETINE HCL 30 MG PO CPEP
60.0000 mg | ORAL_CAPSULE | Freq: Every evening | ORAL | Status: DC
Start: 2016-04-27 — End: 2016-04-28
  Administered 2016-04-27: 60 mg via ORAL
  Filled 2016-04-27 (×2): qty 2

## 2016-04-27 MED ORDER — NITROGLYCERIN 0.4 MG SL SUBL
SUBLINGUAL_TABLET | SUBLINGUAL | Status: AC
Start: 2016-04-27 — End: ?
  Filled 2016-04-27: qty 25

## 2016-04-27 MED ORDER — INSULIN ASPART 100 UNIT/ML SC SOPN
5.0000 [IU] | PEN_INJECTOR | Freq: Every day | SUBCUTANEOUS | Status: DC
Start: 2016-04-27 — End: 2016-04-28
  Administered 2016-04-27: 5 [IU] via SUBCUTANEOUS
  Filled 2016-04-27: qty 3

## 2016-04-27 MED ORDER — ACETAMINOPHEN 650 MG RE SUPP
650.0000 mg | RECTAL | Status: DC | PRN
Start: 2016-04-27 — End: 2016-04-28

## 2016-04-27 MED ORDER — AMITRIPTYLINE HCL 25 MG PO TABS
25.0000 mg | ORAL_TABLET | Freq: Every evening | ORAL | Status: DC
Start: 2016-04-27 — End: 2016-04-28
  Administered 2016-04-27: 25 mg via ORAL
  Filled 2016-04-27 (×2): qty 1

## 2016-04-27 MED ORDER — RANOLAZINE ER 500 MG PO TB12
500.0000 mg | ORAL_TABLET | Freq: Two times a day (BID) | ORAL | Status: DC
Start: 2016-04-27 — End: 2016-04-28
  Administered 2016-04-27 – 2016-04-28 (×2): 500 mg via ORAL
  Filled 2016-04-27 (×4): qty 1

## 2016-04-27 MED ORDER — ONDANSETRON 4 MG PO TBDP
4.0000 mg | ORAL_TABLET | Freq: Three times a day (TID) | ORAL | Status: DC | PRN
Start: 2016-04-27 — End: 2016-04-28

## 2016-04-27 MED ORDER — INSULIN ASPART 100 UNIT/ML SC SOPN
1.0000 [IU] | PEN_INJECTOR | Freq: Three times a day (TID) | SUBCUTANEOUS | Status: DC | PRN
Start: 2016-04-27 — End: 2016-04-28
  Administered 2016-04-28: 4 [IU] via SUBCUTANEOUS

## 2016-04-27 MED ORDER — LANTUS SOLOSTAR 100 UNIT/ML SC SOPN
12.0000 [IU] | PEN_INJECTOR | Freq: Every evening | SUBCUTANEOUS | Status: DC
Start: 2016-04-27 — End: 2016-04-28
  Administered 2016-04-27: 12 [IU] via SUBCUTANEOUS
  Filled 2016-04-27: qty 3

## 2016-04-27 MED ORDER — ACETAMINOPHEN 160 MG/5ML PO SOLN
650.0000 mg | ORAL | Status: DC | PRN
Start: 2016-04-27 — End: 2016-04-28

## 2016-04-27 MED ORDER — INSULIN ASPART 100 UNIT/ML SC SOPN
5.0000 [IU] | PEN_INJECTOR | Freq: Every morning | SUBCUTANEOUS | Status: DC
Start: 2016-04-28 — End: 2016-04-28

## 2016-04-27 MED ORDER — NITROGLYCERIN 2 % TD OINT
1.0000 [in_us] | TOPICAL_OINTMENT | Freq: Once | TRANSDERMAL | Status: DC
Start: 2016-04-27 — End: 2016-04-27

## 2016-04-27 MED ORDER — ASPIRIN 81 MG PO CHEW
324.0000 mg | CHEWABLE_TABLET | Freq: Once | ORAL | Status: AC
Start: 2016-04-27 — End: 2016-04-27
  Administered 2016-04-27: 324 mg via ORAL

## 2016-04-27 MED ORDER — SODIUM CHLORIDE 0.9 % IV BOLUS
500.0000 mL | Freq: Once | INTRAVENOUS | Status: AC
Start: 2016-04-27 — End: 2016-04-27
  Administered 2016-04-27: 500 mL via INTRAVENOUS

## 2016-04-27 MED ORDER — METOPROLOL TARTRATE 25 MG PO TABS
100.0000 mg | ORAL_TABLET | Freq: Two times a day (BID) | ORAL | Status: DC
Start: 2016-04-27 — End: 2016-04-28
  Administered 2016-04-27 – 2016-04-28 (×2): 100 mg via ORAL
  Filled 2016-04-27 (×2): qty 4

## 2016-04-27 MED ORDER — ISOSORBIDE MONONITRATE ER 30 MG PO TB24
30.0000 mg | ORAL_TABLET | Freq: Every morning | ORAL | Status: DC
Start: 2016-04-28 — End: 2016-04-28
  Filled 2016-04-27 (×2): qty 1

## 2016-04-27 MED ORDER — ACETAMINOPHEN 325 MG PO TABS
650.0000 mg | ORAL_TABLET | ORAL | Status: DC | PRN
Start: 2016-04-27 — End: 2016-04-28
  Administered 2016-04-27: 650 mg via ORAL
  Filled 2016-04-27: qty 2

## 2016-04-27 MED ORDER — ASPIRIN 81 MG PO CHEW
81.0000 mg | CHEWABLE_TABLET | Freq: Every day | ORAL | Status: DC
Start: 2016-04-28 — End: 2016-04-28
  Administered 2016-04-28: 81 mg via ORAL
  Filled 2016-04-27: qty 1

## 2016-04-27 NOTE — ED Notes (Signed)
Bed: N1-A  Expected date:   Expected time:   Means of arrival:   Comments:  Moving pt to CDU

## 2016-04-27 NOTE — Plan of Care (Signed)
Problem: Chest Pain  Goal: Vital signs and cardiac rhythm stable  Outcome: Progressing   04/27/16 1732   Goal/Interventions addressed this shift   Vital signs and cardiac rhythm stable Monitor Michael [name] vital signs/cardiac rhythms;Monitor labs;Assess the need for oxygen therapy and administer as ordered     Goal: Cardiac pain management  Outcome: Progressing   04/27/16 1732   Goal/Interventions addressed this shift   Cardiac pain management  Assess/report chest pain/or related discomfort to LIP immediately;Instruct patient to report any change in pain status;Assess pain/or related discomfort on admission, during daily assessment, before and after any intervention;Include patient and patient care companion in decisions related to pain management       Comments: Pt admitted to room 2511. Admission completed by primary nurse, will continue to monitor.

## 2016-04-27 NOTE — ED Triage Notes (Addendum)
Pt presents to the ER with c/o Midsternal chest pain and "left sided Numbness" reports seen at [facility] hospital for same yesterday. Denies SOB. Denies HA

## 2016-04-27 NOTE — ED Provider Notes (Signed)
EMERGENCY DEPARTMENT  History and Physical Exam       Patient Name: Michael [name], Michael [name]  Encounter Date:  04/27/2016  Attending Physician: Michael [name], M.D.  PCP: Michael Soman, DO  Patient DOB:  Dec 20, 1955  MRN:  [id number]  Room:  2511/2511-A    Medical Decision Making     Patient complaining of chest pressure rated around the left side to his back goes up into his neck and down into his left arm. He has numbness in his left arm. No weakness. No trauma. Certain shortness of breath. No association with exertion. Been sweaty. He reports vomiting and diarrhea week ago. No cough.  Patient was seen yesterday at [facility] and discharged. Sounds like he had a cardiac workup there. Cardiologist is Dr. Howie [name].    Patient's physical exam is normal. Her parents is moist. Neck supple. Heart regular without murmur. Lungs are clear to auscultation. No chest wall tenderness. No abdominal tenderness. He is moving his extremities well. He has good grips. Good pulse in his left arm. No leg edema. No calf tenderness.    ED Course      Concerning for ACS. Patient has a history and says this feels similar. He took some nitroglycerin with a little bit of temporary relief.    EKG without emphysema ischemia. Troponin 0. Labs are normal. Recommend further cardiac risk stratification. I spoke with the observation unit to this end.    In addition to the above history, please see nursing notes. Allergies, meds, past medical, family, social hx, and the results of the diagnostic studies performed have been reviewed by myself.         Diagnosis / Disposition     Clinical Impression  1. Chest pain, unspecified type        Disposition  ED Disposition     ED Disposition Condition Date/Time Comment    Observation  Wed Apr 27, 2016  5:11 PM Admitting Physician: Dedra Skeens [[id number]]   Diagnosis: Chest pain [5409811]   Estimated Length of Stay: < 2 midnights   Tentative Discharge Plan?: Home or Self Care [1]   Patient Class:  Observation [104]              Follow up for Discharged Patients  No follow-up provider specified.    Prescriptions for Discharged Patients  Current Discharge Medication List                      History of Presenting Illness     Chief complaint: Chest Pain    HPI/ROS is limited by: none  HPI/ROS given by: patient    Michael [name] is a 61 y.o. male who presents to the ED with complaint of chest pain. Patient reports that he's been experiencing left chest pressure that radiates around to his left side. It is a 7/10 at this time. There's some radiation to his left neck and left arm. There is some numbness in his left arm. He denies any weakness. Patient reports diaphoresis, vomiting, and diarrhea. There is no association with exertion. Patient was seen yesterday at [facility] and discharged. Patient denies any other medical concerns at this time.     Review of Systems   Constitutional: No fever.  No chills.  GI: No nausea.  + vomiting. + diarrhea. No bright red blood/melena per rectum. No abdominal pain.   Ears:  No ear pain.  Nose:  No congestion.  No discharge  Throat:  No sore  throat.  No difficulty swallowing.  Cardiovascular: + chest pain.  No palpitations.  Respiratory: No cough.  No shortness of breath.  GU:  No dysuria. No hematuria.   Neurological:  No headache.  No weakness.  Musculoskeletal:  No pain.  Skin:  No rash.  No skin lesions.  Psychiatric:  No depression.  No anxiety.      All other systems reviewed and negative except as above, pertinent findings in HPI.     Allergies & Medications     Pt is allergic to gabapentin; glucophage [metformin hydrochloride]; keflex [cephalexin]; and morphine.    Current Discharge Medication List      CONTINUE these medications which have NOT CHANGED    Details   acetaminophen (TYLENOL) 325 MG tablet Take 2 tablets (650 mg total) by mouth every 4 (four) hours as needed for Fever (temperature greater than 38.5 C).  Qty: 40 tablet, Refills: 0      amitriptyline (ELAVIL)  25 MG tablet Take 25 mg by mouth nightly.      aspirin EC 81 MG EC tablet Take 81 mg by mouth every morning.      atorvastatin (LIPITOR) 40 MG tablet Take 40 mg by mouth nightly.      DULoxetine (CYMBALTA) 60 MG capsule Take 60 mg by mouth nightly.      fenofibrate (LOFIBRA) 160 MG tablet Take 160 mg by mouth every morning.      hydrochlorothiazide (HYDRODIURIL) 12.5 MG tablet Take 12.5 mg by mouth every morning.          hydrOXYzine (VISTARIL) 50 MG capsule Take 1 capsule (50 mg total) by mouth nightly.  Qty: 30 capsule, Refills: 0      insulin aspart (NOVOLOG) 100 UNIT/ML injection pen Inject 1-5 Units into the skin 4 times daily - with meals and at bedtime.  Qty: 15 mL, Refills: 0      Insulin Degludec (TRESIBA FLEXTOUCH) 100 UNIT/ML Solution Pen-injector Inject 50 Unit into the skin nightly.      isosorbide mononitrate (IMDUR) 30 MG 24 hr tablet Take 30 mg by mouth every morning.          metoprolol tartrate (LOPRESSOR) 100 MG tablet Take 100 mg by mouth 2 (two) times daily.      Multiple Vitamin (ONE-A-DAY MENS) Tab Take 1 tablet by mouth every morning.      nitroglycerin (NITROSTAT) 0.4 MG SL tablet Place 0.4 mg under the tongue every 5 (five) minutes as needed.      ranitidine (ZANTAC) 300 MG tablet Take 300 mg by mouth every morning.          ranolazine (RANEXA) 500 MG 12 hr tablet Take 500 mg by mouth 2 (two) times daily.      sitaGLIPtin (JANUVIA) 100 MG tablet Take 100 mg by mouth every morning.                  Past Medical History     Pt   Past Medical History:   Diagnosis Date   . CAD (coronary artery disease)    . Cellulitis of finger of left hand 10/22/14   . Diabetes mellitus    . Gastroesophageal reflux disease    . Hernia    . Hyperlipidemia    . Hypertension    . Myocardial infarction    . Neuropathy     diabetic   . Presence of stent in coronary artery     4 STENTS   . TIA (transient ischemic  attack)         Past Surgical History     Pt   Past Surgical History:   Procedure Laterality Date   .  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 CERVICAL FUSION     . CIRCUMCISION, > 13 YEARS, ADULT N/A 09/08/2014    Procedure: CIRCUMCISION, > 13 YEARS, ADULT;  Surgeon: Ellwood Handler, MD;  Location: [facility];  Service: Urology;  Laterality: N/A;  CIRCUMCISION   . CORONARY ANGIOPLASTY WITH STENT PLACEMENT      2 stents   . CORONARY ANGIOPLASTY WITH STENT PLACEMENT  12/13/12    DES to OM1, 2 stents   . LUMBAR LAMINECTOMY          Family History     The family history includes Diabetes in his mother and sister; Heart attack in his father and mother; Heart disease in his father and mother; Stroke in his father and mother.     Social History     Pt reports that he has never smoked. He has never used smokeless tobacco. He reports that he drinks alcohol. He reports that he does not use drugs.     Physical Exam     Blood pressure 144/89, pulse 96, temperature 97.8 F (36.6 C), temperature source Oral, resp. rate 20, height 1.727 m, weight 88.5 kg, SpO2 98 %.    Vitals signs reviewed.    Constitutional:  Well-appearing.  Well-nourished. No acute distress.  Head:  Atraumatic, normocephalic  Eyes:  Pupils equal, and round.  Conjunctiva clear. No injection.  Nose:   No discharge.   Throat:  Oropharynx moist.    Neck:  Supple, non tender.  No cervical lymphadenopathy. No JVD.  Respiratory:  Breath sounds normal.  No distress. No wheezes, rales or rhonchi.   Cardiovascular:  Heart normal rate and regular rhythm.  No murmurs/gallops/rubs.  Pulses +2 bilaterally  Abdomen:  Soft. Non-tender.  Normal bowel sounds. No distension. No bruits. No guarding. No rebound.   Back:  No CVA tenderness bilaterally. No spinal Tenderness.  Extremities:  Full range of motion.  No edema.  No cyanosis.  No deformity. He is moving his extremities well. He has  good grips. Good pulse in his left arm. No leg edema. No calf tenderness.  Skin:  Warm.  Dry.  No pallor.  No rashes.  No lesions.  No bruises  Neurological:  Alert. Oriented.  No focal motor deficits.   Cranial Nerves II-XII Grossly intact.  Psychiatric:  Normal affect.  No anxiety.  No depression.  No agitation.       Diagnostic Results     The results of the diagnostic studies have been reviewed by myself:    Radiologic Studies  Xr Chest Ap Portable    Result Date: 04/27/2016  No acute cardiopulmonary disease ReadingStation:[id number]           Consultation                Procedures     None          Michael [name], M.D.      ATTESTATIONS      The documentation recorded by my scribe, Kandy Garrison, accurately reflects the services I personally performed and the decisions made by me.  Michael Gess Grace [name], M.D.  Note: This chart was generated by the Epic EMR system/speech recognition and may contain inherent errors or omissions not intended by the user. Grammatical errors, random word insertions, deletions, pronoun errors and incomplete sentences are occasional consequences of this technology due to software limitations. Not all errors are caught or corrected. If there are questions or concerns about the content of this note or information contained within the body of this dictation they should be addressed directly with the author for clarification        Tori Milks, MD  04/27/16 2300

## 2016-04-27 NOTE — H&P (Signed)
[facility]  OBSERVATION UNIT  HISTORY AND PHYSICAL       Patient: Michael [name]  Admission Date: 04/27/2016    DOB: June 01, 1955  Age: 61 y.o.    MRN: [id number]  Sex: male    PCP: Myrtie Soman, DO  Attending:          HISTORY OF PRESENT ILLNESS     Michael [name] is a 61 y.o. male who presented to the Emergency Room with chest pain. Patient states that he's been having left-sided chest pressure that starts on the left and goes up to his back around and then down to his left arm. He also has some numbness in his left arm. He does not feel weak. He has been having this pain for more than a day. He also presented with this same complaint yesterday at [facility] ([facility] stated he said that he had a chest x-ray, EKG and lab work done. She was told that it was all normal. Patient does have a history of 4 stents last done around 2010; EPIC charting shows 2 stents were placed in 2014. He has been followed most recently by Dr. Howie Ill. He states the last time he saw him was approximately 2 years ago and he did have a stress test that was done then which was normal. He was told that he does still have some heart disease that they were still monitoring. He states that he is a known diabetic I asked him whether his glucoses have been controlled he said that it had been running better. I told him that blood glucose was high in the ER and he stated that it had not been high recently. He does have known neuropathy. Patient did receive one sublingual nitroglycerin in the ED and it dropped his blood pressure he was bolused and his pressure started to rise again. Patient is being transferred observation for further cardiac workup.    PAST MEDICAL HISTORY     Code Status: Prior    Past Medical History:   Diagnosis Date   . CAD (coronary artery disease)    . Cellulitis of finger of left hand 10/22/14   . Diabetes mellitus    . Gastroesophageal reflux disease    . Hernia    . Hyperlipidemia    . Hypertension     . Myocardial infarction    . Neuropathy     diabetic   . Presence of stent in coronary artery     4 STENTS   . TIA (transient ischemic attack)        Past Surgical History:   Procedure Laterality Date   . 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 CERVICAL FUSION     . CIRCUMCISION, > 13 YEARS, ADULT N/A 09/08/2014    Procedure: CIRCUMCISION, > 13 YEARS, ADULT;  Surgeon: Ellwood Handler, MD;  Location: [facility];  Service: Urology;  Laterality: N/A;  CIRCUMCISION   . CORONARY ANGIOPLASTY WITH STENT PLACEMENT      2 stents   . CORONARY ANGIOPLASTY WITH STENT PLACEMENT  12/13/12    DES to OM1, 2 stents   . LUMBAR LAMINECTOMY         Current/Home Medications    ACETAMINOPHEN (TYLENOL) 325 MG TABLET    Take 2 tablets (650 mg total) by mouth every 4 (  four) hours as needed for Fever (temperature greater than 38.5 C).    ASPIRIN 81 MG CHEWABLE TABLET    Chew 1 tablet (81 mg total) by mouth daily.    ATORVASTATIN (LIPITOR) 10 MG TABLET    Take 4 tablets (40 mg total) by mouth nightly.    DOXYCYCLINE (VIBRAMYCIN) 100 MG CAPSULE    Take 100 mg by mouth daily.    DULOXETINE (CYMBALTA) 30 MG CAPSULE    Take 30 mg by mouth daily.    FENOFIBRATE (TRICOR) 48 MG TABLET    Take 2 tablets (96 mg total) by mouth daily.    GLIPIZIDE (GLUCOTROL) 10 MG TABLET    Take 10 mg by mouth 2 (two) times daily before meals.    HYDROCHLOROTHIAZIDE (HYDRODIURIL) 12.5 MG TABLET    Take 12.5 mg by mouth daily.    HYDROXYZINE (VISTARIL) 50 MG CAPSULE    Take 1 capsule (50 mg total) by mouth nightly.    INSULIN ASPART (NOVOLOG) 100 UNIT/ML INJECTION PEN    Inject 1-5 Units into the skin 4 times daily - with meals and at bedtime.    INSULIN GLARGINE (LANTUS SOLOSTAR) 100 UNIT/ML INJECTION PEN    Inject 10 Units into the skin nightly.    ISOSORBIDE MONONITRATE  (IMDUR) 30 MG 24 HR TABLET    Take 30 mg by mouth daily.     METOPROLOL TARTRATE PO    Take 100 mg by mouth 2 (two) times daily.    MUPIROCIN (BACTROBAN) 2 % OINTMENT    Apply topically every 8 (eight) hours.    NITROGLYCERIN (NITROSTAT) 0.4 MG SL TABLET    Place 0.4 mg under the tongue every 5 (five) minutes as needed.    OXYCODONE-ACETAMINOPHEN (PERCOCET) 7.5-325 MG PER TABLET    Take 1 tablet by mouth every 4 (four) hours as needed.    PRASUGREL (EFFIENT) 5 MG TAB    Take 2 tablets (10 mg total) by mouth daily.    RANITIDINE (ZANTAC) 300 MG TABLET    Take 300 mg by mouth daily.    SITAGLIPTIN (JANUVIA) 100 MG TABLET    Take 100 mg by mouth daily.    TRAMADOL (ULTRAM) 50 MG TABLET    Take 50 mg by mouth every 8 (eight) hours as needed for Pain.       Allergies:   Allergies   Allergen Reactions   . Gabapentin    . Glucophage [Metformin Hydrochloride]    . Keflex [Cephalexin]    . Morphine        Family History   Problem Relation Age of Onset   . Heart disease Mother    . Stroke Mother    . Diabetes Mother    . Heart attack Mother    . Heart disease Father    . Stroke Father    . Heart attack Father    . Diabetes Sister        SHx:  reports that he has never smoked. He has never used smokeless tobacco. He reports that he drinks alcohol. He reports that he does not use drugs.    REVIEW OF SYSTEMS     Review of Systems   Constitutional: Negative.    HENT: Negative.    Eyes: Negative.    Respiratory: Positive for shortness of breath. Negative for cough, hemoptysis, sputum production and wheezing.    Cardiovascular: Positive for chest pain. Negative for palpitations, orthopnea and leg swelling.   Gastrointestinal: Negative.    Genitourinary:  Negative.    Musculoskeletal: Positive for joint pain. Negative for back pain, myalgias and neck pain.   Skin: Negative.    Neurological: Positive for tingling.   Endo/Heme/Allergies: Negative.    Psychiatric/Behavioral: Negative.      PHYSICAL EXAM     Vital Signs: Blood pressure  98/57, pulse (!) 127, temperature 99.7 F (37.6 C), temperature source Tympanic, resp. rate 21, height 1.727 m (5\' 8" ), weight 95 kg (209 lb 7 oz), SpO2 94 %.  Constitutional: Well developed, well nourished, active, in no apparent distress.  HENT:   Head: Normocephalic, atraumatic  Ears: No external lesions.  Nose: No external lesions. No epistaxis or drainage.  Eyes: PERRL. No scleral icterus. No conjunctival injection. EOMI.  Neck: Trachea is midline. No JVD. Normal range of motion. No apparent masses.  Cardiovascular: Regular rhythm, S1 normal and S2 normal.    No murmur heard.  Pulmonary/Chest: Effort normal. Lungs clear to auscultation bilaterally.   Abdominal: Soft, non-tender, non-distended. No masses.   Genitourinay/Anorectal: Defferred  Musculoskeletal: Normal range of motion. No deformity or apparent injury.   Neurological: Pt is alert. Cranial nerves are grossly intact. Moving all extremities without apparent deficit.   Psychiatric: Affect is appropriate. There is no agitation.   Skin: Skin is warm, dry, well perfused. No rash noted. No cyanosis. No pallor. No apparent wound.    LABS & IMAGING     Labs:  Results for orders placed or performed during the hospital encounter of 04/27/16   Basic Metabolic Panel   Result Value Ref Range    Sodium 133 (L) 136 - 147 mMol/L    Potassium 4.3 3.5 - 5.3 mMol/L    Chloride 100 98 - 110 mMol/L    CO2 28.8 20.0 - 30.0 mMol/L    Calcium 9.2 8.5 - 10.5 mg/dL    Glucose 829 (H) 70 - 99 mg/dL    Creatinine 5.62 1.30 - 1.30 mg/dL    BUN 13 7 - 22 mg/dL    Anion Gap 8.5 7.0 - 18.0 mMol/L    BUN/Creatinine Ratio 15.5 10.0 - 30.0 Ratio    EGFR 95 60 - 150 mL/min/1.10m2    Osmolality Calc 281 275 - 300 mOsm/kg   CBC and differential   Result Value Ref Range    WBC 5.6 4.0 - 11.0 K/cmm    RBC 5.16 4.00 - 5.70 M/cmm    Hemoglobin 15.4 13.0 - 17.5 gm/dL    Hematocrit 86.5 78.4 - 52.5 %    MCV 84 80 - 100 fL    MCH 30 28 - 35 pg    MCHC 36 32 - 36 gm/dL    RDW 69.6 29.5 - 28.4 %     PLT CT 181 130 - 440 K/cmm    MPV 7.0 6.0 - 10.0 fL    NEUTROPHIL % 61.1 42.0 - 78.0 %    Lymphocytes 28.4 15.0 - 46.0 %    Monocytes 6.2 3.0 - 15.0 %    Eosinophils % 3.6 0.0 - 7.0 %    Basophils % 0.7 0.0 - 3.0 %    Neutrophils Absolute 3.5 1.7 - 8.6 K/cmm    Lymphocytes Absolute 1.6 0.6 - 5.1 K/cmm    Monocytes Absolute 0.4 0.1 - 1.7 K/cmm    Eosinophils Absolute 0.2 0.0 - 0.8 K/cmm    BASO Absolute 0.0 0.0 - 0.3 K/cmm   Troponin I (STAT)   Result Value Ref Range    Troponin I 0.00 0.00 -  0.02 ng/mL       Imaging:  XR Chest AP Portable    (Results Pending)           EMERGENCY DEPARTMENT COURSE     ED Medication Orders     Start Ordered     Status Ordering Provider    04/27/16 1700 04/27/16 1700  sodium chloride 0.9 % bolus 500 mL  Once in ED     Route: Intravenous  Ordered Dose: 500 mL     Last MAR action:  New Bag WATTS, MICHAEL E    04/27/16 1640 04/27/16 1639  aspirin chewable tablet 324 mg  Once in ED     Route: Oral  Ordered Dose: 324 mg     Last MAR action:  Given WATTS, MICHAEL E    04/27/16 1640 04/27/16 1639  nitroglycerin (NITROSTAT) SL tablet 0.4 mg  Every 5 min     Route: Sublingual  Ordered Dose: 0.4 mg     Last MAR action:  Not Given WATTS, MICHAEL E    04/27/16 1640 04/27/16 1639  nitroglycerin (NITRO-BID) 2 % ointment 1 inch  Once in ED     Route: Topical  Ordered Dose: 1 inch     Acknowledged WATTS, MICHAEL E    04/27/16 1502 04/27/16 1501  sodium chloride 0.9 % bolus 1,000 mL  Once in ED     Route: Intravenous  Ordered Dose: 1,000 mL     Last MAR action:  Stopped WATTS, DAVID J          ED documentation including nurses notes were reviewed by myself.  The case was discussed with the ED Attending.    ASSESSMENT & PLAN     Michael [name] is a 61 y.o. male admitted under OBSERVATION with chest pain.     Assessment & Plan:  1. Transferred observation and monitor on cardiac telemetry  2. Serial cardiac troponins  3. Add on lipid panel, TSH, magnesium level, D dimer and hemoglobin A1c  4. If  d-dimer positive CTA chest needed  5. Nuclear stress test in a.m., HVI to Read  6. Cardiology consult is needed  7. MRSA surveillance cultures to take patient off of isolation of possible         Anticipate d/c home in One day        I certify the need for admission to the Observation Unit based on the patient's history and the information above.    Esmond Plants, NP   04/27/2016 5:09 PM

## 2016-04-27 NOTE — Plan of Care (Signed)
Problem: Chest Pain  Goal: Vital signs and cardiac rhythm stable  Outcome: Progressing

## 2016-04-27 NOTE — ED Notes (Signed)
Pt taken to obs via w/c by sylvia at this time with belongings.

## 2016-04-27 NOTE — ED Notes (Signed)
Michael [name] from Obs at bedside at this time.

## 2016-04-27 NOTE — ED Notes (Signed)
Dr. Grace Isaac made aware of pt drop in BP with Nitro tab. Reports give no more, given 500cc bolus and apply paste once BP is better.

## 2016-04-28 ENCOUNTER — Observation Stay: Payer: BC Managed Care – PPO

## 2016-04-28 LAB — VH DEXTROSE STICK GLUCOSE: Glucose POCT: 311 mg/dL — ABNORMAL HIGH (ref 70–99)

## 2016-04-28 MED ORDER — REGADENOSON 0.4 MG/5ML IV SOLN
0.4000 mg | Freq: Once | INTRAVENOUS | Status: AC
Start: 2016-04-28 — End: 2016-04-28
  Administered 2016-04-28: 0.4 mg via INTRAVENOUS
  Filled 2016-04-28: qty 5

## 2016-04-28 MED ORDER — DICYCLOMINE HCL 20 MG PO TABS
20.0000 mg | ORAL_TABLET | Freq: Four times a day (QID) | ORAL | 1 refills | Status: DC
Start: 2016-04-28 — End: 2016-11-29

## 2016-04-28 MED ORDER — TECHNETIUM TC 99M SESTAMIBI - CARDIOLITE
32.8000 | Freq: Once | Status: AC | PRN
Start: 2016-04-28 — End: 2016-04-28
  Administered 2016-04-28: 32.8 via INTRAVENOUS

## 2016-04-28 MED ORDER — DICYCLOMINE HCL 20 MG PO TABS
20.0000 mg | ORAL_TABLET | Freq: Four times a day (QID) | ORAL | Status: DC
Start: 2016-04-28 — End: 2016-04-28
  Administered 2016-04-28: 20 mg via ORAL
  Filled 2016-04-28: qty 1

## 2016-04-28 MED ORDER — REGADENOSON 0.4 MG/5ML IV SOLN
INTRAVENOUS | Status: AC
Start: 2016-04-28 — End: ?
  Filled 2016-04-28: qty 5

## 2016-04-28 MED ORDER — TECHNETIUM TC 99M SESTAMIBI - CARDIOLITE
11.0000 | Freq: Once | Status: AC | PRN
Start: 2016-04-28 — End: 2016-04-28
  Administered 2016-04-28: 11 via INTRAVENOUS

## 2016-04-28 NOTE — Consults (Signed)
Provided literature.  Reviewed role of insulin, normo-, hyper- and hypoglycemia including rx of hypoglycemia.  Dicussed insulin administration, site selection, storage of insulin.  Identified sources and distribution of CHO.  Provided sample menus.  Discussed exercise and stress management.  Pt following with Dr. Glade Lloyd for insulin adjustment.  Phone number provided if questions arise.

## 2016-04-28 NOTE — UM Notes (Addendum)
OBSERVATION ADMISSION  [facility]    PATIENT:  Michael [name]  DOB 23-Jun-1955      [employer] ID # [id number]    Patient admitted to Observation unit : OBS order 04/27/16 1711        Please call Clide Deutscher, RN with any questions  Phone [phone]  Fax [phone]  Thank you            [facility] Utilization Management Review Sheet    Facility :  [facility]    NAME: Michael [name]  MR#: [id number]    CSN#: [id number]    ROOM: 2511/2511-A AGE: 61 y.o.    ADMIT DATE AND TIME: 04/27/2016  1:54 PM      PATIENT CLASS: Observation status    ATTENDING PHYSICIAN: Dedra Skeens,*  PAYOR:Payor: OUT OF STATE BLUE CROSS / Plan: BCBS OUT OF STATE / Product Type: BCBS /       AUTH #:     DIAGNOSIS:     ICD-10-CM    1. Chest pain, unspecified type R07.9        HISTORY:   Past Medical History:   Diagnosis Date   . CAD (coronary artery disease)    . Cellulitis of finger of left hand 10/22/14   . Diabetes mellitus    . Gastroesophageal reflux disease    . Hernia    . Hyperlipidemia    . Hypertension    . Myocardial infarction    . Neuropathy     diabetic   . Presence of stent in coronary artery     4 STENTS   . TIA (transient ischemic attack)        DATE OF REVIEW: 04/28/2016    VITALS: BP 121/73   Pulse 85   Temp 97.6 F (36.4 C) (Oral)   Resp 16   Ht 1.727 m (5\' 8" )   Wt 88.5 kg (195 lb)   SpO2 98%   BMI 29.65 kg/m     Active Hospital Problems    Diagnosis   . Chest pain       UR NOTE:  ER admit  Observation status  OBS unit      Per MD H&P note:   61 y.o. male who presented to the Emergency Room with chest pain. Patient states that he's been having left-sided chest pressure that starts on the left and goes up to his back around and then down to his left arm. He also has some numbness in his left arm. He does not feel weak. He has been having this pain for more than a day. He also presented with this same complaint yesterday at [facility] stated he said that he had a chest x-ray,  EKG and lab work done. She was told that it was all normal. Patient does have a history of 4 stents last done around 2010; EPIC charting shows 2 stents were placed in 2014. He has been followed most recently by Dr. Howie Ill. He states the last time he saw him was approximately 2 years ago and he did have a stress test that was done then which was normal. He was told that he does still have some heart disease that they were still monitoring. He states that he is a known diabetic I asked him whether his glucoses have been controlled he said that it had been running better. I told him that blood glucose was high in the ER and he stated that it had not  been high recently. He does have known neuropathy. Patient did receive one sublingual nitroglycerin in the ED and it dropped his blood pressure he was bolused and his pressure started to rise again. Patient is being transferred observation for further cardiac workup.    ASSESSMENT & PLAN     Michael [name] is a 61 y.o. male admitted under OBSERVATION with chest pain.     Assessment & Plan:  1. Transferred observation and monitor on cardiac telemetry  2. Serial cardiac troponins  3. Add on lipid panel, TSH, magnesium level, D dimer and hemoglobin A1c  4. If d-dimer positive CTA chest needed  5. Nuclear stress test in a.m., HVI to Read  6. Cardiology consult is needed  7. MRSA surveillance cultures to take patient off of isolation of possible         Anticipate d/c home in One day          LABS:  Glucose 362  Na+ 133  Trop neg x 3  MRSA cx pending      MEDS:  No IV meds scheduled    OBS order  OBS unit  OBS under [id number]  Await stress test results

## 2016-04-28 NOTE — Discharge Summary (Signed)
[facility]  OBSERVATION UNIT  DISCHARGE SUMMARY      Patient: Michael [name]  Admission Date: 04/27/2016  1:54 PM   DOB: 17-Dec-1955  Discharge Date: 04/28/16 1:40 PM   MRN: [id number]  Primary Care: Myrtie Soman, DO         Presentation     Michael [name] is a 61 y.o. male who presented with chest pain. Patient states that he's been having left-sided chest pressure that starts on the left and goes up to his back around and then down to his left arm. He also has some numbness in his left arm. He does not feel weak. He has been having this pain for more than a day. He also presented with this same complaint yesterday at [facility] stated he said that he had a chest x-ray, EKG and lab work done. She was told that it was all normal. Patient does have a history of 4 stents last done around 2010; EPIC charting shows 2 stents were placed in 2014. He has been followed most recently by Dr. Howie Ill. He states the last time he saw him was approximately 2 years ago and he did have a stress test that was done then which was normal. He was told that he does still have some heart disease that they were still monitoring. He states that he is a known diabetic I asked him whether his glucoses have been controlled he said that it had been running better. I told him that blood glucose was high in the ER and he stated that it had not been high recently. He does have known neuropathy. Patient did receive one sublingual nitroglycerin in the ED and it dropped his blood pressure he was bolused and his pressure started to rise again. Patient is being transferred observation for further cardiac workup.    Patient was admitted to the Observation Unit with a diagnosis of   Patient Active Problem List   Diagnosis   . CAD (coronary artery disease)   . Chest pain   . Glucosuria   . BPH (benign prostatic hyperplasia)   . Balanitis   . Cellulitis of finger   . Type 2 diabetes mellitus   . Hypertension   .  Cellulitis of middle finger, left              Hospital Course     Vitals:    04/28/16 1101   BP: 121/73   Pulse: 85   Resp:    Temp:    SpO2:      Patient was transferred observation and monitored on cardiac telemetry. He did not have a return of his chest discomfort which she presented. His cardiac enzymes were negative. He had low HDL HDL and elevated triglycerides at 329. His A1c was very high at 13.3. We did obtain diabetic education for him.he had a nuclear stress test that was read as low risk with no new perfusion defects, no ischemia. Ejection fraction was normal at 55%. In speaking with the patient he could be having some postprandial discomfort and there was no prior imaging of his abdomen. He was counseled that this could potentially be his gallbladder or esophagitis and he will speak with his primary care about this if it seems to continue.           Ancillary Studies     Labs:  Results for orders placed or performed during the hospital encounter of 04/27/16   Basic Metabolic Panel  Result Value Ref Range    Sodium 133 (L) 136 - 147 mMol/L    Potassium 4.3 3.5 - 5.3 mMol/L    Chloride 100 98 - 110 mMol/L    CO2 28.8 20.0 - 30.0 mMol/L    Calcium 9.2 8.5 - 10.5 mg/dL    Glucose 161 (H) 70 - 99 mg/dL    Creatinine 0.96 0.45 - 1.30 mg/dL    BUN 13 7 - 22 mg/dL    Anion Gap 8.5 7.0 - 18.0 mMol/L    BUN/Creatinine Ratio 15.5 10.0 - 30.0 Ratio    EGFR 95 60 - 150 mL/min/1.45m2    Osmolality Calc 281 275 - 300 mOsm/kg   CBC and differential   Result Value Ref Range    WBC 5.6 4.0 - 11.0 K/cmm    RBC 5.16 4.00 - 5.70 M/cmm    Hemoglobin 15.4 13.0 - 17.5 gm/dL    Hematocrit 40.9 81.1 - 52.5 %    MCV 84 80 - 100 fL    MCH 30 28 - 35 pg    MCHC 36 32 - 36 gm/dL    RDW 91.4 78.2 - 95.6 %    PLT CT 181 130 - 440 K/cmm    MPV 7.0 6.0 - 10.0 fL    NEUTROPHIL % 61.1 42.0 - 78.0 %    Lymphocytes 28.4 15.0 - 46.0 %    Monocytes 6.2 3.0 - 15.0 %    Eosinophils % 3.6 0.0 - 7.0 %    Basophils % 0.7 0.0 - 3.0 %     Neutrophils Absolute 3.5 1.7 - 8.6 K/cmm    Lymphocytes Absolute 1.6 0.6 - 5.1 K/cmm    Monocytes Absolute 0.4 0.1 - 1.7 K/cmm    Eosinophils Absolute 0.2 0.0 - 0.8 K/cmm    BASO Absolute 0.0 0.0 - 0.3 K/cmm   Troponin I (STAT)   Result Value Ref Range    Troponin I 0.00 0.00 - 0.02 ng/mL   Hemoglobin A1C   Result Value Ref Range    Hgb A1C, % 13.3 %   Lipid Panel   Result Value Ref Range    Cholesterol 115 75 - 199 mg/dL    Triglycerides 213 (H) 10 - 150 mg/dL    HDL 27 (L) 40 - 55 mg/dL    LDL Calculated 22 mg/dL    Coronary Heart Disease Risk 4.26     VLDL 66 (H) 0 - 40   Magnesium   Result Value Ref Range    Magnesium 1.9 1.6 - 2.6 mg/dL   TSH, Abn Reflex to Free T4, Serum   Result Value Ref Range    Thyroid Stimulating Hormone 0.59 0.40 - 4.20 uIU/mL    T4 Free NI 0.70 - 1.48 ng/dL    Narrative    NI - Not Indicated   D-dimer, Quantitative   Result Value Ref Range    D-Dimer <0.19 (L) 0.19 - 0.52 mg/L FEU   Troponin I   Result Value Ref Range    Troponin I <0.01 0.00 - 0.02 ng/mL   Troponin I   Result Value Ref Range    Troponin I 0.01 0.00 - 0.02 ng/mL   Dextrose Stick Glucose   Result Value Ref Range    Glucose, POCT 225 (H) 70 - 99 mg/dL   Dextrose Stick Glucose   Result Value Ref Range    Glucose, POCT 311 (H) 70 - 99 mg/dL       Radiology:  NM  Myocardial Perfusion Spect   Final Result      XR Chest AP Portable   Final Result   No acute cardiopulmonary disease      ReadingStation:[id number]      Cardiac Stress Test    (Results Pending)       Stress Test Results:          Disposition     Diagnosis:   Patient Active Problem List    Diagnosis Date Noted   . Cellulitis of finger 10/22/2014   . Type 2 diabetes mellitus 10/22/2014   . Hypertension 10/22/2014   . Cellulitis of middle finger, left 10/22/2014   . Glucosuria 09/08/2014   . BPH (benign prostatic hyperplasia) 09/08/2014   . Balanitis 09/08/2014   . Chest pain 03/18/2013   . CAD (coronary artery disease) 12/13/2012       Past Medical History:   Diagnosis  Date   . CAD (coronary artery disease)    . Cellulitis of finger of left hand 10/22/14   . Diabetes mellitus    . Gastroesophageal reflux disease    . Hernia    . Hyperlipidemia    . Hypertension    . Myocardial infarction    . Neuropathy     diabetic   . Presence of stent in coronary artery     4 STENTS   . TIA (transient ischemic attack)        Discharge to: Home    Condition: Stable    If you continue to have symptoms after you eat you may be having problems with your gallbladder.    Speak to your primary care about this.  Try Bentyl for cramping.     Follow up with your PCP.     Medication(s):  Current Discharge Medication List      CONTINUE these medications which have NOT CHANGED    Details   acetaminophen (TYLENOL) 325 MG tablet Take 2 tablets (650 mg total) by mouth every 4 (four) hours as needed for Fever (temperature greater than 38.5 C).  Qty: 40 tablet, Refills: 0      amitriptyline (ELAVIL) 25 MG tablet Take 25 mg by mouth nightly.      aspirin EC 81 MG EC tablet Take 81 mg by mouth every morning.      atorvastatin (LIPITOR) 40 MG tablet Take 40 mg by mouth nightly.      DULoxetine (CYMBALTA) 60 MG capsule Take 60 mg by mouth nightly.      fenofibrate (LOFIBRA) 160 MG tablet Take 160 mg by mouth every morning.      hydrochlorothiazide (HYDRODIURIL) 12.5 MG tablet Take 12.5 mg by mouth every morning.          hydrOXYzine (VISTARIL) 50 MG capsule Take 1 capsule (50 mg total) by mouth nightly.  Qty: 30 capsule, Refills: 0      insulin aspart (NOVOLOG) 100 UNIT/ML injection pen Inject 1-5 Units into the skin 4 times daily - with meals and at bedtime.  Qty: 15 mL, Refills: 0      Insulin Degludec (TRESIBA FLEXTOUCH) 100 UNIT/ML Solution Pen-injector Inject 50 Unit into the skin nightly.      isosorbide mononitrate (IMDUR) 30 MG 24 hr tablet Take 30 mg by mouth every morning.          metoprolol tartrate (LOPRESSOR) 100 MG tablet Take 100 mg by mouth 2 (two) times daily.      Multiple Vitamin (ONE-A-DAY MENS)  Tab Take 1 tablet by mouth  every morning.      nitroglycerin (NITROSTAT) 0.4 MG SL tablet Place 0.4 mg under the tongue every 5 (five) minutes as needed.      ranitidine (ZANTAC) 300 MG tablet Take 300 mg by mouth every morning.          ranolazine (RANEXA) 500 MG 12 hr tablet Take 500 mg by mouth 2 (two) times daily.      sitaGLIPtin (JANUVIA) 100 MG tablet Take 100 mg by mouth every morning.                 Current Discharge Medication List      START taking these medications    Details   dicyclomine (BENTYL) 20 MG tablet Take 1 tablet (20 mg total) by mouth 4 (four) times daily.  Qty: 30 tablet, Refills: 1             Follow Up:  Follow-up Information     Myrtie Soman, DO .    Specialty:  Residents  Contact information:  [address]  [place] [address]  [phone]

## 2016-04-28 NOTE — Plan of Care (Signed)
Problem: Chest Pain  Goal: Cardiac pain management  Outcome: Progressing

## 2016-04-28 NOTE — Discharge Instr - AVS First Page (Addendum)
If you continue to have symptoms after you eat you may be having problems with your gallbladder.    Speak to your primary care about this.  Try Bentyl for cramping.     Follow up with your PCP.

## 2016-04-28 NOTE — Progress Notes (Signed)
NM cardiac stress test completed. Lexiscan 0.4mg IV given over 20 seconds during stress test. Tolerated well.    Araiya Tilmon, RN, RRT  Nuclear Card Stress Lab RN  [phone]

## 2016-04-28 NOTE — Progress Notes (Signed)
Nuclear stress test - 0.4 mg lexiscan injected over 20 seconds; no st changes or chest pain; sestamibi images [place] [id number]

## 2016-05-18 ENCOUNTER — Inpatient Hospital Stay (EMERGENCY_DEPARTMENT_HOSPITAL)
Admission: EM | Admit: 2016-05-18 | Discharge: 2016-05-18 | Disposition: A | Discharge status: Home / Self Care | Payer: BC Managed Care – PPO

## 2016-05-18 DIAGNOSIS — J101 Influenza due to other identified influenza virus with other respiratory manifestations: Secondary | ICD-10-CM

## 2016-06-29 ENCOUNTER — Encounter (INDEPENDENT_AMBULATORY_CARE_PROVIDER_SITE_OTHER): Payer: Self-pay | Admitting: Cardiovascular Disease

## 2016-06-29 NOTE — Nursing Note (Signed)
Annual follow-up on his history of CAD, Cerebrovascular Disease, LE Edema, and OSA.  He was last seen on 06-26-15 when he reported stable SOB with exertion and LE edema, therefore no changes were made, however he was given a script for compression stockings.  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      Cardiovascular Workup:     LHC 10/21/11 (Dr. Duffy Rhody)  1. Prior right coronary stent which remains patent with minimal luminal encroachment.  2. 3-vessel atherosclerosis without significant obstructive disease.  3. Normal overall systolic function with focal inferior basal hypokinesis  RECOMMENDATIONS: Medical management with vigorous secondary prevention.    Echo 10/16/12  1. The left ventricle is thickened in a fashion consistent with mild concentric hypertrophy. Global systolic function was normal. The left ventricular ejection fraction is estimated to be 55-60%.   2. Diffuse thickening (sclerosis) without reduced excursion in the aortic valve. There is evidence of trivial (trace) aortic regurgitation.   3. There is evidence of trivial (trace) mitral regurgitation. There is evidence of sclerosis of the mitral valve.   4. There is evidence of trace (trivial) tricuspid regurgitation. The estimated right ventricular systolic pressure is 18 mmHg.   5. There is no evidence of pericardial effusion.   6. There is no previous echocardiogram available for comparison.     ABI/PVR 10/16/12  1. Normal bilateral ABIs at rest and post exercise.   2. Limited PVRs at ankles and toe pressures are normal.    Nuclear Stress Test 12/03/12  1. LV systolic function appears moderately reduced   2. Calculated LVEF 38%   3. Gated images show moderate global hypokinesis.   4. Perfusion images show a large area of ischemia from base to distal in the infero-lateral wall with atleast moderate reversibility.  5. High risk study due to size of  defect and reduced EF.    LHC 12/13/12  1. 99% proximal OM2 stenosis status post percutaneous balloon angioplasty followed by drug-eluting stent placement, Xience 3.25 x 23 mm.  2. Prior stents in RCA and proximal left circumflex noted to be patent.    Nuclear Stress Test 03/18/13  1. Comparing the baseline and the stress images, no significant fixed or reversible perfusion defect noted.  2. Gated imaging shows normal EF.  3. Low risk Lexiscan nuclear stress test.    LHC 03/19/13  1. Patent stents in the RCA and circumflex marginal.  2. Significant 1st diagonal branch disease.  3. Normal LV systolic and diastolic function.    Nuclear Stress Test 04/12/14  1. Unremarkable Lexiscan Cardiolite stress test without evidence for infarct or ischemia, and visually normal-appearing left ventricular ejection fraction.    Sleep Study 08/20/15  1. Moderate sleep apnea with significant desaturations.  2. RECOMMENDATION: For the patient to undergo a full night CPAP titration for the moderate sleep apnea with desaturations.    ------------------------------------------    CAD:  MI s/p stenting in 2000 at [facility] in [facility].  s/p stenting in 2010, data unavailable.  LHC 10/21/11- Prior RCA stent which remains patent with minimal luminal encroachment. 3-vessel atherosclerosis without significant obstructive disease.  Nuclear Stress Test 12/03/12- LVEF 38%. Gated images show moderate global hypokinesis. Perfusion images show a large area of ischemia from base to distal in the infero-lateral wall with at least moderate reversibility. High risk study due to size of defect and reduced EF.  LHC 12/13/12- 99% proximal  OM2 stenosis s/p percutaneous balloon angioplasty followed by drug-eluting stent placement, Xience 3.25 x 23 mm.  LHC 03/19/13- Patent stents in the RCA and circumflex marginal. Significant 1st diagonal branch disease. Normal LV systolic and diastolic function.  Nuclear Stress Test 04/12/14- Unremarkable  Lexiscan Cardiolite stress test without evidence for infarct or ischemia.    Cerebrovascular Disease:  Hx of TIA, per Dr. Verne Spurr report.  - No residual deficits    LE Edema:      HTN w/ Mild LVH:      Hyperlipidemia:  Lipid Panel 04/12/14- Total 149, Trig 343, HDL 25, LDL 47    OSA:  Sleep Study 08/20/15- Moderate sleep apnea with significant desaturations.

## 2016-07-05 ENCOUNTER — Encounter (INDEPENDENT_AMBULATORY_CARE_PROVIDER_SITE_OTHER): Payer: Self-pay | Admitting: Cardiovascular Disease

## 2016-08-08 ENCOUNTER — Inpatient Hospital Stay (EMERGENCY_DEPARTMENT_HOSPITAL)
Admission: EM | Admit: 2016-08-08 | Discharge: 2016-08-08 | Disposition: A | Discharge status: Home / Self Care | Payer: BC Managed Care – PPO

## 2016-08-08 DIAGNOSIS — E1121 Type 2 diabetes mellitus with diabetic nephropathy: Secondary | ICD-10-CM

## 2016-08-08 NOTE — Progress Notes (Signed)
Call received this morning from [facility] that pt was there in their ED.  When they were going to d/c him home pt made the statement that if the pills they were sending him home with didn't work, then he was going to kill himself.  Pt came in for diabetic pain which he is being treated with Cymbalta.  Pt is also a very brittle diabetic; BS 614 on arrival (272 last check 07:59); was given 15U of Novolin R and fluids as well as hydrocodone/acetaminophen 5/325.  Pt then requested to come to [facility] to be treated.  Call placed to Dr. Marland Mcalpine; felt pt was not medically stable and also manipulating and denied admission to unit.  Call placed to Robin RN at [phone] ext [phone] with an update.  She also states that they felt it was manipulation as well.  She will let patient know.

## 2016-10-12 ENCOUNTER — Inpatient Hospital Stay (EMERGENCY_DEPARTMENT_HOSPITAL)
Admission: EM | Admit: 2016-10-12 | Discharge: 2016-10-12 | Disposition: A | Discharge status: Home / Self Care | Payer: MEDICAID

## 2016-10-12 ENCOUNTER — Encounter (RURAL_HEALTH_CENTER): Payer: Self-pay

## 2016-10-12 DIAGNOSIS — E119 Type 2 diabetes mellitus without complications: Secondary | ICD-10-CM

## 2016-10-12 DIAGNOSIS — M79671 Pain in right foot: Secondary | ICD-10-CM

## 2016-10-17 ENCOUNTER — Inpatient Hospital Stay (EMERGENCY_DEPARTMENT_HOSPITAL)
Admission: EM | Admit: 2016-10-17 | Discharge: 2016-10-17 | Disposition: A | Discharge status: Home / Self Care | Payer: MEDICAID

## 2016-10-17 DIAGNOSIS — R110 Nausea: Secondary | ICD-10-CM

## 2016-10-17 DIAGNOSIS — K760 Fatty (change of) liver, not elsewhere classified: Secondary | ICD-10-CM

## 2016-10-18 ENCOUNTER — Ambulatory Visit (RURAL_HEALTH_CENTER): Payer: Self-pay | Admitting: Primary Podiatric Medicine

## 2016-10-28 ENCOUNTER — Inpatient Hospital Stay (EMERGENCY_DEPARTMENT_HOSPITAL)
Admission: EM | Admit: 2016-10-28 | Discharge: 2016-10-28 | Disposition: A | Discharge status: Home / Self Care | Payer: MEDICAID

## 2016-10-28 DIAGNOSIS — R079 Chest pain, unspecified: Secondary | ICD-10-CM

## 2016-11-16 ENCOUNTER — Inpatient Hospital Stay (EMERGENCY_DEPARTMENT_HOSPITAL)
Admission: EM | Admit: 2016-11-16 | Discharge: 2016-11-16 | Disposition: A | Discharge status: Home / Self Care | Payer: MEDICAID

## 2016-11-16 DIAGNOSIS — I252 Old myocardial infarction: Secondary | ICD-10-CM

## 2016-11-16 DIAGNOSIS — Z959 Presence of cardiac and vascular implant and graft, unspecified: Secondary | ICD-10-CM

## 2016-11-16 DIAGNOSIS — Z7982 Long term (current) use of aspirin: Secondary | ICD-10-CM

## 2016-11-16 DIAGNOSIS — R079 Chest pain, unspecified: Secondary | ICD-10-CM

## 2016-11-16 DIAGNOSIS — R002 Palpitations: Secondary | ICD-10-CM

## 2016-11-16 DIAGNOSIS — I10 Essential (primary) hypertension: Secondary | ICD-10-CM

## 2016-11-16 DIAGNOSIS — E119 Type 2 diabetes mellitus without complications: Secondary | ICD-10-CM

## 2016-11-18 ENCOUNTER — Emergency Department: Payer: 59

## 2016-11-18 ENCOUNTER — Observation Stay
Admission: EM | Admit: 2016-11-18 | Discharge: 2016-11-19 | Disposition: A | Discharge status: Home / Self Care | Payer: 59 | Attending: Emergency Medicine | Admitting: Emergency Medicine

## 2016-11-18 DIAGNOSIS — I252 Old myocardial infarction: Secondary | ICD-10-CM | POA: Insufficient documentation

## 2016-11-18 DIAGNOSIS — Z7982 Long term (current) use of aspirin: Secondary | ICD-10-CM | POA: Insufficient documentation

## 2016-11-18 DIAGNOSIS — I2510 Atherosclerotic heart disease of native coronary artery without angina pectoris: Secondary | ICD-10-CM | POA: Insufficient documentation

## 2016-11-18 DIAGNOSIS — R61 Generalized hyperhidrosis: Secondary | ICD-10-CM | POA: Insufficient documentation

## 2016-11-18 DIAGNOSIS — Z8673 Personal history of transient ischemic attack (TIA), and cerebral infarction without residual deficits: Secondary | ICD-10-CM | POA: Insufficient documentation

## 2016-11-18 DIAGNOSIS — K219 Gastro-esophageal reflux disease without esophagitis: Secondary | ICD-10-CM | POA: Insufficient documentation

## 2016-11-18 DIAGNOSIS — Z981 Arthrodesis status: Secondary | ICD-10-CM | POA: Insufficient documentation

## 2016-11-18 DIAGNOSIS — I10 Essential (primary) hypertension: Secondary | ICD-10-CM | POA: Insufficient documentation

## 2016-11-18 DIAGNOSIS — Z885 Allergy status to narcotic agent status: Secondary | ICD-10-CM | POA: Insufficient documentation

## 2016-11-18 DIAGNOSIS — Z881 Allergy status to other antibiotic agents status: Secondary | ICD-10-CM | POA: Insufficient documentation

## 2016-11-18 DIAGNOSIS — Z888 Allergy status to other drugs, medicaments and biological substances status: Secondary | ICD-10-CM | POA: Insufficient documentation

## 2016-11-18 DIAGNOSIS — Z955 Presence of coronary angioplasty implant and graft: Secondary | ICD-10-CM | POA: Insufficient documentation

## 2016-11-18 DIAGNOSIS — Z79899 Other long term (current) drug therapy: Secondary | ICD-10-CM | POA: Insufficient documentation

## 2016-11-18 DIAGNOSIS — Z823 Family history of stroke: Secondary | ICD-10-CM | POA: Insufficient documentation

## 2016-11-18 DIAGNOSIS — Z833 Family history of diabetes mellitus: Secondary | ICD-10-CM | POA: Insufficient documentation

## 2016-11-18 DIAGNOSIS — R072 Precordial pain: Secondary | ICD-10-CM

## 2016-11-18 DIAGNOSIS — E785 Hyperlipidemia, unspecified: Secondary | ICD-10-CM | POA: Insufficient documentation

## 2016-11-18 DIAGNOSIS — Z8249 Family history of ischemic heart disease and other diseases of the circulatory system: Secondary | ICD-10-CM | POA: Insufficient documentation

## 2016-11-18 DIAGNOSIS — E1140 Type 2 diabetes mellitus with diabetic neuropathy, unspecified: Secondary | ICD-10-CM | POA: Insufficient documentation

## 2016-11-18 DIAGNOSIS — Z794 Long term (current) use of insulin: Secondary | ICD-10-CM | POA: Insufficient documentation

## 2016-11-18 DIAGNOSIS — R079 Chest pain, unspecified: Principal | ICD-10-CM | POA: Insufficient documentation

## 2016-11-18 DIAGNOSIS — Z89022 Acquired absence of left finger(s): Secondary | ICD-10-CM | POA: Insufficient documentation

## 2016-11-18 DIAGNOSIS — E1165 Type 2 diabetes mellitus with hyperglycemia: Secondary | ICD-10-CM | POA: Insufficient documentation

## 2016-11-18 DIAGNOSIS — N400 Enlarged prostate without lower urinary tract symptoms: Secondary | ICD-10-CM | POA: Insufficient documentation

## 2016-11-18 LAB — CBC AND DIFFERENTIAL
Basophils %: 1 % (ref 0.0–3.0)
Basophils Absolute: 0.1 10*3/uL (ref 0.0–0.3)
Eosinophils %: 2.9 % (ref 0.0–7.0)
Eosinophils Absolute: 0.1 10*3/uL (ref 0.0–0.8)
Hematocrit: 42.2 % (ref 39.0–52.5)
Hemoglobin: 14.7 gm/dL (ref 13.0–17.5)
Lymphocytes Absolute: 1.9 10*3/uL (ref 0.6–5.1)
Lymphocytes: 37.6 % (ref 15.0–46.0)
MCH: 30 pg (ref 28–35)
MCHC: 35 gm/dL (ref 32–36)
MCV: 84 fL (ref 80–100)
MPV: 7 fL (ref 6.0–10.0)
Monocytes Absolute: 0.3 10*3/uL (ref 0.1–1.7)
Monocytes: 6 % (ref 3.0–15.0)
Neutrophils %: 52.5 % (ref 42.0–78.0)
Neutrophils Absolute: 2.7 10*3/uL (ref 1.7–8.6)
PLT CT: 185 10*3/uL (ref 130–440)
RBC: 5 10*6/uL (ref 4.00–5.70)
RDW: 13 % (ref 11.0–14.0)
WBC: 5.1 10*3/uL (ref 4.0–11.0)

## 2016-11-18 LAB — I-STAT CHEM 8 CARTRIDGE
Anion Gap I-Stat: 17 — ABNORMAL HIGH (ref 7.0–16.0)
BUN I-Stat: 15 mg/dL (ref 7–22)
Calcium Ionized I-Stat: 4.8 mg/dL (ref 4.35–5.10)
Chloride I-Stat: 102 mMol/L (ref 98–110)
Creatinine I-Stat: 0.6 mg/dL — ABNORMAL LOW (ref 0.80–1.30)
EGFR: 109 mL/min/{1.73_m2} (ref 60–150)
Glucose I-Stat: 360 mg/dL — ABNORMAL HIGH (ref 71–99)
Hematocrit I-Stat: 41 % (ref 39.0–52.5)
Hemoglobin I-Stat: 13.9 gm/dL (ref 13.0–17.5)
Potassium I-Stat: 4.2 mMol/L (ref 3.5–5.3)
Sodium I-Stat: 140 mMol/L (ref 136–147)
TCO2 I-Stat: 26 mMol/L (ref 24–29)

## 2016-11-18 LAB — PT AND APTT
PT INR: 1.1 (ref 0.5–1.3)
PT: 11 s (ref 9.5–11.5)
aPTT: 23.3 s — ABNORMAL LOW (ref 24.0–34.0)

## 2016-11-18 LAB — LIPID PANEL
Cholesterol: 101 mg/dL (ref 75–199)
Coronary Heart Disease Risk: 3.26
HDL: 31 mg/dL — ABNORMAL LOW (ref 40–55)
LDL Calculated: 19 mg/dL
Triglycerides: 253 mg/dL — ABNORMAL HIGH (ref 10–150)
VLDL: 51 — ABNORMAL HIGH (ref 0–40)

## 2016-11-18 LAB — VH I-STAT TROPONIN NOTIFICATION

## 2016-11-18 LAB — HEMOGLOBIN A1C: Hgb A1C, %: 10.7 %

## 2016-11-18 LAB — I-STAT TROPONIN: Troponin I I-Stat: <0.02 ng/mL (ref 0.00–0.02)

## 2016-11-18 LAB — VH I-STAT CHEM 8 NOTIFICATION

## 2016-11-18 MED ORDER — INSULIN ASPART 100 UNIT/ML SC SOPN
2.0000 [IU] | PEN_INJECTOR | Freq: Three times a day (TID) | SUBCUTANEOUS | Status: DC | PRN
Start: 2016-11-18 — End: 2016-11-19
  Administered 2016-11-19: 13:00:00 3 [IU] via SUBCUTANEOUS
  Filled 2016-11-18: qty 3

## 2016-11-18 MED ORDER — ASPIRIN 81 MG PO TBEC
81.0000 mg | DELAYED_RELEASE_TABLET | Freq: Every morning | ORAL | Status: DC
Start: 2016-11-19 — End: 2016-11-19
  Administered 2016-11-19: 81 mg via ORAL
  Filled 2016-11-18: qty 1

## 2016-11-18 MED ORDER — DEXTROSE 10 % IV BOLUS
250.0000 mL | INTRAVENOUS | Status: DC | PRN
Start: 2016-11-18 — End: 2016-11-19

## 2016-11-18 MED ORDER — FAMOTIDINE 20 MG PO TABS
20.0000 mg | ORAL_TABLET | Freq: Two times a day (BID) | ORAL | Status: DC
Start: 2016-11-18 — End: 2016-11-19
  Administered 2016-11-19 (×2): 20 mg via ORAL
  Filled 2016-11-18 (×2): qty 1

## 2016-11-18 MED ORDER — FENOFIBRATE 160 MG PO TABS
160.0000 mg | ORAL_TABLET | Freq: Every morning | ORAL | Status: DC
Start: 2016-11-19 — End: 2016-11-19
  Administered 2016-11-19: 12:00:00 160 mg via ORAL
  Filled 2016-11-18: qty 1

## 2016-11-18 MED ORDER — INSULIN ASPART 100 UNIT/ML SC SOPN
1.0000 [IU] | PEN_INJECTOR | Freq: Every evening | SUBCUTANEOUS | Status: DC | PRN
Start: 2016-11-18 — End: 2016-11-19

## 2016-11-18 MED ORDER — ACETAMINOPHEN 325 MG PO TABS
650.0000 mg | ORAL_TABLET | ORAL | Status: DC | PRN
Start: 2016-11-18 — End: 2016-11-19
  Administered 2016-11-19: 650 mg via ORAL
  Filled 2016-11-18: qty 2

## 2016-11-18 MED ORDER — NITROGLYCERIN 0.4 MG SL SUBL
0.4000 mg | SUBLINGUAL_TABLET | SUBLINGUAL | Status: DC | PRN
Start: 2016-11-18 — End: 2016-11-19
  Administered 2016-11-19 (×3): 0.4 mg via SUBLINGUAL
  Filled 2016-11-18: qty 25

## 2016-11-18 MED ORDER — LANTUS SOLOSTAR 100 UNIT/ML SC SOPN
50.0000 [IU] | PEN_INJECTOR | Freq: Every evening | SUBCUTANEOUS | Status: DC
Start: 2016-11-18 — End: 2016-11-19
  Administered 2016-11-19: 01:00:00 50 [IU] via SUBCUTANEOUS
  Filled 2016-11-18: qty 3

## 2016-11-18 MED ORDER — HYDROCODONE-ACETAMINOPHEN 5-325 MG PO TABS
1.0000 | ORAL_TABLET | ORAL | Status: DC | PRN
Start: 2016-11-18 — End: 2016-11-19
  Administered 2016-11-19: 1 via ORAL
  Filled 2016-11-18: qty 1

## 2016-11-18 MED ORDER — AMITRIPTYLINE HCL 25 MG PO TABS
25.0000 mg | ORAL_TABLET | Freq: Every evening | ORAL | Status: DC
Start: 2016-11-18 — End: 2016-11-19
  Filled 2016-11-18 (×2): qty 1

## 2016-11-18 MED ORDER — ATORVASTATIN CALCIUM 40 MG PO TABS
40.0000 mg | ORAL_TABLET | Freq: Every evening | ORAL | Status: DC
Start: 2016-11-18 — End: 2016-11-19
  Administered 2016-11-19: 40 mg via ORAL
  Filled 2016-11-18 (×2): qty 1

## 2016-11-18 MED ORDER — DICYCLOMINE HCL 20 MG PO TABS
20.0000 mg | ORAL_TABLET | Freq: Four times a day (QID) | ORAL | Status: DC
Start: 2016-11-18 — End: 2016-11-19
  Administered 2016-11-19: 20 mg via ORAL
  Filled 2016-11-18: qty 1

## 2016-11-18 MED ORDER — SODIUM CHLORIDE 0.9 % IJ SOLN
0.4000 mg | INTRAMUSCULAR | Status: DC | PRN
Start: 2016-11-18 — End: 2016-11-19

## 2016-11-18 MED ORDER — HYDROXYZINE PAMOATE 25 MG PO CAPS
50.0000 mg | ORAL_CAPSULE | Freq: Every evening | ORAL | Status: DC
Start: 2016-11-18 — End: 2016-11-19
  Administered 2016-11-19: 01:00:00 50 mg via ORAL
  Filled 2016-11-18: qty 2

## 2016-11-18 MED ORDER — ACETAMINOPHEN 160 MG/5ML PO SOLN
650.0000 mg | ORAL | Status: DC | PRN
Start: 2016-11-18 — End: 2016-11-19

## 2016-11-18 MED ORDER — ASPIRIN 81 MG PO CHEW
324.0000 mg | CHEWABLE_TABLET | Freq: Once | ORAL | Status: AC
Start: 2016-11-18 — End: 2016-11-19
  Administered 2016-11-19: 324 mg via ORAL
  Filled 2016-11-18: qty 4

## 2016-11-18 MED ORDER — METOPROLOL TARTRATE 25 MG PO TABS
100.0000 mg | ORAL_TABLET | Freq: Two times a day (BID) | ORAL | Status: DC
Start: 2016-11-18 — End: 2016-11-19
  Administered 2016-11-19 (×2): 100 mg via ORAL
  Filled 2016-11-18 (×2): qty 4

## 2016-11-18 MED ORDER — DULOXETINE HCL 30 MG PO CPEP
60.0000 mg | ORAL_CAPSULE | Freq: Every evening | ORAL | Status: DC
Start: 2016-11-18 — End: 2016-11-19
  Filled 2016-11-18 (×2): qty 2

## 2016-11-18 MED ORDER — ACETAMINOPHEN 650 MG RE SUPP
650.0000 mg | RECTAL | Status: DC | PRN
Start: 2016-11-18 — End: 2016-11-19

## 2016-11-18 MED ORDER — ISOSORBIDE MONONITRATE ER 30 MG PO TB24
30.0000 mg | ORAL_TABLET | Freq: Every morning | ORAL | Status: DC
Start: 2016-11-19 — End: 2016-11-19
  Administered 2016-11-19: 12:00:00 30 mg via ORAL
  Filled 2016-11-18: qty 1

## 2016-11-18 MED ORDER — SITAGLIPTIN PHOSPHATE 100 MG PO TABS
100.0000 mg | ORAL_TABLET | Freq: Every morning | ORAL | Status: DC
Start: 2016-11-19 — End: 2016-11-19
  Administered 2016-11-19: 12:00:00 100 mg via ORAL
  Filled 2016-11-18: qty 1

## 2016-11-18 MED ORDER — HYDROCHLOROTHIAZIDE 12.5 MG PO TABS
12.5000 mg | ORAL_TABLET | Freq: Every morning | ORAL | Status: DC
Start: 2016-11-19 — End: 2016-11-19
  Filled 2016-11-18: qty 1

## 2016-11-18 MED ORDER — GLUCAGON 1 MG IJ SOLR (WRAP)
1.0000 mg | INTRAMUSCULAR | Status: DC | PRN
Start: 2016-11-18 — End: 2016-11-19

## 2016-11-18 MED ORDER — ONDANSETRON 4 MG PO TBDP
4.0000 mg | ORAL_TABLET | Freq: Four times a day (QID) | ORAL | Status: DC | PRN
Start: 2016-11-18 — End: 2016-11-19

## 2016-11-18 MED ORDER — RANOLAZINE ER 500 MG PO TB12
500.0000 mg | ORAL_TABLET | Freq: Two times a day (BID) | ORAL | Status: DC
Start: 2016-11-18 — End: 2016-11-19
  Administered 2016-11-19: 12:00:00 500 mg via ORAL
  Filled 2016-11-18 (×3): qty 1

## 2016-11-18 NOTE — ED Triage Notes (Addendum)
Pt presents to Ed with c/o chest pain that started an hour and half ago. Pt describes it as pressure. [facility] HR. Pt reports fever, cough, n/v this past week. Pt states his left a hand is numb and tingles all the way up to his shoulder. Pt reports dizziness, denies sob. Pt denies any vision problems.

## 2016-11-18 NOTE — EDIE (Signed)
Michael [name]?NOTIFICATION?11/18/2016 21:06?Michael [name]?MRN: [id number]    This patient has registered at the [facility] Emergency Department   For more information visit: [email or web address]   Known Aliases  No known aliases.   Care Providers  Provider [facility] Type Phone Fax Service Dates   ARMSTRONG, CAROLINE A, D.O. Internal Medicine [phone] Current    Unknown Case Manager/Care Coordinator   Current    Unknown Case Manager/Care Coordinator   Current    [facility] [place] Other   Current    [facility] [phone] Current    [facility] Primary Care   Current    [facility]   Current      ED Care Guidelines  There are currently no ED Care Guidelines in Michael [name] for this patient. Please check your facility's medical records system.    Security Events  No recent Security Events currently on file    Recent Emergency Department Visit Summary  Admit Date Facility [facility] Type Major Type Diagnoses or Chief Complaint   Nov 18, 2016 [facility] [place] [place] Emergency  Emergency      chest pain      Nov 16, 2016 [facility] Emergency  Emergency     Oct 28, 2016 [facility] [place] Emergency  Emergency      1. Chest pain, unspecified      Oct 17, 2016 [facility] [place] Emergency  Emergency      0. Unspecified abdominal pain      1. Nausea      2. Fatty (change of) liver, not elsewhere classified      Oct 12, 2016 [facility] [place] Emergency  Emergency      0. Pain in right toe(s)      1. Pain in right foot          Recent Inpatient Visit Summary  No recorded inpatient visits.     E.D. Visit Count (12 mo.)  Facility Visits   [facility] 2   [facility] 10   Total 12   Note: Visits indicate total known visits.        Prescription Monitoring Program  000??- Narcotic Use Score  000??- Sedative Use  Score  000??- Stimulant Use Score  - All Scores range from 000-999 with 75% of the population scoring < 200 and on 1% scoring above 650  - The last digit of the narcotic, sedative, and stimulant score indicates the number of active prescriptions of that type  - Higher Use scores correlate with increased prescribers, pharmacies, mg equiv, and overlapping prescriptions   Concerning or unexpectedly high scores should prompt a review of the PMP record; this does not constitute checking PMP for prescribing purposes.    The above information is provided for the sole purpose of patient treatment. Use of this information beyond the terms of Data Sharing Memorandum of Understanding and License Agreement is prohibited. In certain cases not all visits may be represented. Consult the aforementioned facilities for additional information.   ? 2018 [employer] - [place], [place] - [email or web address]

## 2016-11-18 NOTE — ED Provider Notes (Signed)
Physician/Midlevel provider first contact with patient: 11/18/16 2204         EMERGENCY DEPARTMENT HISTORY AND PHYSICAL EXAM      Date Time: 11/18/16 10:13 PM  Patient Name: Michael [name]  Attending Physician: Marvene Staff, MD    Assessment/Plan:   Chest pain  No acute EKG/troponin findings  Poorly controlled diabetes mellitus  ASA 325 mg po in ER  Place in observation      MDM     The patient presents with chest pain potentially from coronary artery disease.  Initial cardiac markers and EKG are nondiagnostic. Based upon the presentation the patient appears to be at low risk for PE, aortic dissection, pneumothorax, pneumonia or other serious conditions. The plan is to admit this patient for cardiac evaluation, observation and further testing.  The admission plan was discussed with the patient and he agrees.        History of Presenting Illness:   History provided by:  Patient  Michael [name] is a 61 y.o. male     Patient presents for chest pain; extremity weakness.  Context: Patient states onset of chest pain today (08/03) [place].  Patient states ill-feeling with mid-sternal chest pressure that radiated into the back and the left shoulder and the left side of his neck.  Patient also states feeling left-sided facial numbness.  Patient states having diaphoresis and nausea at the time of this discomfort.  Denies any SOB.  Patient states he's had this type of discomfort in the past and today's episode felt similar.  Patient states h/o stent placement and CAD.  He states he sees Dr. Howie Ill as his cardiologist and notes "I still have heart blockage but not enough to do anything about it".  Patient also notes h/o neuropathy.  Denies any lung disease or h/o smoking.  H/o neck fusion.     Location: chest, mid-sternal   Quality: presure  Radiation: pain radiates into the left shoulder, neck and back  Severity: moderate  Duration: Onset today (08/03)  Associated  signs/symptoms: nausea, diaphoresis  Exacerbating/mitigating factors: none      Past Medical History:     Past Medical History:   Diagnosis Date   . CAD (coronary artery disease)    . Cellulitis of finger of left hand 10/22/14   . Diabetes mellitus    . Gastroesophageal reflux disease    . Hernia    . Hyperlipidemia    . Hypertension    . Myocardial infarction    . Neuropathy     diabetic   . Presence of stent in coronary artery     4 STENTS   . TIA (transient ischemic attack)        Past Surgical History:     Past Surgical History:   Procedure Laterality Date   . 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 CERVICAL FUSION     . CIRCUMCISION, > 13 YEARS, ADULT N/A 09/08/2014    Procedure: CIRCUMCISION, > 13 YEARS, ADULT;  Surgeon: Ellwood Handler, MD;  Location: [facility];  Service: Urology;  Laterality: N/A;  CIRCUMCISION   . CORONARY ANGIOPLASTY WITH STENT PLACEMENT      2 stents   . CORONARY ANGIOPLASTY WITH STENT PLACEMENT  12/13/12    DES to OM1, 2  stents   . LUMBAR LAMINECTOMY         Family History:     Family History   Problem Relation Age of Onset   . Heart disease Mother    . Stroke Mother    . Diabetes Mother    . Heart attack Mother    . Heart disease Father    . Stroke Father    . Heart attack Father    . Diabetes Sister        Social History:     Social History     Social History   . Marital status: Single     Spouse name: N/A   . Number of children: N/A   . Years of education: N/A     Social History Main Topics   . Smoking status: Never Smoker   . Smokeless tobacco: Never Used   . Alcohol use Yes      Comment: VEY RARE   . Drug use: No   . Sexual activity: Not on file     Other Topics Concern   . Not on file     Social History Narrative   . No narrative on file       Allergies:     Allergies   Allergen Reactions   .  Gabapentin      "Makes crazy/hyper"   . Glucophage [Metformin Hydrochloride] Nausea And Vomiting   . Keflex [Cephalexin] Nausea And Vomiting   . Morphine      Hallucinations       Medications:     Previous Medications    ACETAMINOPHEN (TYLENOL) 325 MG TABLET    Take 2 tablets (650 mg total) by mouth every 4 (four) hours as needed for Fever (temperature greater than 38.5 C).    AMITRIPTYLINE (ELAVIL) 25 MG TABLET    Take 25 mg by mouth nightly.    ASPIRIN EC 81 MG EC TABLET    Take 81 mg by mouth every morning.    ATORVASTATIN (LIPITOR) 40 MG TABLET    Take 40 mg by mouth nightly.    DICYCLOMINE (BENTYL) 20 MG TABLET    Take 1 tablet (20 mg total) by mouth 4 (four) times daily.    DULOXETINE (CYMBALTA) 60 MG CAPSULE    Take 60 mg by mouth nightly.    FENOFIBRATE (LOFIBRA) 160 MG TABLET    Take 160 mg by mouth every morning.    HYDROCHLOROTHIAZIDE (HYDRODIURIL) 12.5 MG TABLET    Take 12.5 mg by mouth every morning.        HYDROXYZINE (VISTARIL) 50 MG CAPSULE    Take 1 capsule (50 mg total) by mouth nightly.    INSULIN ASPART (NOVOLOG) 100 UNIT/ML INJECTION PEN    Inject 1-5 Units into the skin 4 times daily - with meals and at bedtime.    INSULIN DEGLUDEC (TRESIBA FLEXTOUCH) 100 UNIT/ML SOLUTION PEN-INJECTOR    Inject 50 Unit into the skin nightly.    ISOSORBIDE MONONITRATE (IMDUR) 30 MG 24 HR TABLET    Take 30 mg by mouth every morning.        METOPROLOL TARTRATE (LOPRESSOR) 100 MG TABLET    Take 100 mg by mouth 2 (two) times daily.    MULTIPLE VITAMIN (ONE-A-DAY MENS) TAB    Take 1 tablet by mouth every morning.    NITROGLYCERIN (NITROSTAT) 0.4 MG SL TABLET    Place 0.4 mg under the tongue every 5 (five) minutes as needed.    RANITIDINE (ZANTAC)  300 MG TABLET    Take 300 mg by mouth every morning.        RANOLAZINE (RANEXA) 500 MG 12 HR TABLET    Take 500 mg by mouth 2 (two) times daily.    SITAGLIPTIN (JANUVIA) 100 MG TABLET    Take 100 mg by mouth every morning.            Review of Systems:   Constitutional:  No  fever.  No chills.  +Diaphoresis.    Ears:  No ear pain.    Nose:  No congestion.  No discharge    Throat:  No sore throat.  No difficulty swallowing.    Cardiovascular: +Chest pain.  No palpitations.    Respiratory: No cough.  No shortness of breath.    GI:  No abdominal pain.  +Nausea.  No vomiting.  No diarrhea.    GU:  No dysuria.    Neurological:  No headache.  No weakness.    Musculoskeletal:  No pain.    Skin:  No rash.  No skin lesions.    Psychiatric:  No depression.  No anxiety.      All other systems reviewed and negative except as above, pertinent findings in HPI.      Physical Exam:   Blood pressure 147/78, pulse (!) 105, resp. rate 17, height 1.727 m, weight 89 kg, SpO2 100 %.  Constitutional:  Vitals signs reviewed. Well-appearing.  Well-nourished. No acute distress.    Head:  Atraumatic, normocephalic    Eyes:  Pupils equal, round, and reactive to light.  Conjunctiva no injection or erythema    Nose:  Mucous membranes moist.  No discharge.     Mouth & Throat:   No erythema.  No exudates     Neck:  Posterior scar along the mid-line.  Supple, non tender.  No cervical lymphadenopathy.    Respiratory:  Breath sounds normal.  No distress    Chest:  Non tender    Cardiovascular:  Heart regular rate and rhythm.  No murmurs/gallops/rubs.  Pulses +2 bilaterally    Abdomen:  Soft. Non-tender. No distension    Back:  Left sided paraspinal irregular scar approx 4 cm x 1cm on the left upper back, second scar - linear midline cervical. No CVA tenderness bilaterally    Extremities:  Full range of motion.  No edema.  No cyanosis.  No deformity.    Skin:  Warm.  Dry.  No pallor.  No rashes.  No lesions.  No bruises    Neurological:  Alert. Oriented to person,place, time.  GCS 15.  No focal motor deficits.  Cranial Nerves II-XII intact.     Psychiatric:  Normal affect.  No anxiety.  No depression.  No agitation.      Lab Results     Labs Reviewed   PT AND APTT - Abnormal; Notable for the following:        Result Value     aPTT 23.3 (*)     All other components within normal limits   I-STAT CHEM 8 CARTRIDGE - Abnormal; Notable for the following:     i-STAT Glucose 360 (*)     i-STAT Creatinine 0.60 (*)     Anion Gap, ISTAT 17.0 (*)     All other components within normal limits   VH I-STAT CHEM 8 NOTIFICATION   VH I-STAT TROPONIN NOTIFICATION   CBC AND DIFFERENTIAL   HEMOGLOBIN A1C   I-STAT TROPONIN  Radiology Results     XR Chest 2 Views   Final Result   No acute cardiopulmonary process.      ReadingStation:[id number]          Labs and Radiological Studies Reviewed    Final Impression     Final diagnoses:   Chest pain, unspecified type       Disposition     ED Disposition     ED Disposition Condition Date/Time Comment    Admit  Fri Nov 18, 2016 10:20 PM           Follow-Up Provider   No follow-up provider specified.        ATTESTATIONS    The documentation recorded by my scribe, Mizrain del [place], accurately reflects the services I personally performed and the decisions made by me.  Andrew Soria Ames Dura, MD                Annalicia Renfrew Ames Dura, MD         Marvene Staff, MD  11/18/16 2225

## 2016-11-18 NOTE — H&P (Signed)
[facility] MEDICAL CENTER  OBSERVATION UNIT  HISTORY AND PHYSICAL       Patient: Michael [name]  Admission Date: 11/18/2016    DOB: 1956-02-13  Age: 61 y.o.    MRN: [id number]  Sex: male    PCP: Myrtie Soman, DO  Attending:          HISTORY OF PRESENT ILLNESS     Michael [name] is a 61 y.o. male who presented to the Emergency Room with chest pain. Patient was seen at [facility] on Tuesday for palpitations and had a negative workup. Then today he was driving in his car when he became nauseous and diaphoretic and had to pull over because he thought he was going to vomit. He had left-sided tingling in his shoulder and neck with chest pressure. He had a left heart catheter in 2014 with Dr. Howie Ill with 1 stent placed. He had a stress test in January 2018  That was read as low risk with a moderate in severity fixed apical lateral wall perfusion defect. His first troponin downstairs was negative at less than 0.02, his chemistry panel was significant for a blood sugar of 360 and an hemoglobin A1c is pending. There is also a lipid panel pending.his EKG in the ER was read as sinus tachycardia with low voltage in the extremity leads and artifact with baseline wander. The case was discussed with Dr. Nechama Guard and it was decided to repeat the stress test. He will be transferred to the observation unit for serial cardiac enzymes, telemetry monitoring, and stress test in the morning. He will receive pain medication, nitrates, and anti-emetics as needed.    Heart Score for Chest Pain Patients Score   History Highly Suspicious 2 points 2    Moderately Suspicious 1 point     Slightly or Non-Suspicious 0 point    ECG Significant ST Depression 2 points 0    Nonspecific Repolarization 1 point     Normal 0 point    Age Greater than or equal to 65 years 2 points 1    Greater than 45 and less than 65 years 1 point     Less than or equal to 45 years 0 point    Risk Factors Greater than 3 risk factors or History of CAD 2 points 2     1 or 2 Risk Factors 1 point     No Risk Factors 0 point    Troponin Greater than or equal to 3 times normal limit 2 points 0    Greater than 1 and less than 3 times normal limit 1 point     Less than or equal to normal limit 0 point    Risk Factors: DM, current or recent (less than one month) smoker, HTN, HIP, family history of CAD, and obesity   TOTAL SCORE 5                             PAST MEDICAL HISTORY     Code Status: Prior    Past Medical History:   Diagnosis Date   . CAD (coronary artery disease)    . Cellulitis of finger of left hand 10/22/14   . Diabetes mellitus    . Gastroesophageal reflux disease    . Hernia    . Hyperlipidemia    . Hypertension    . Myocardial infarction    . Neuropathy     diabetic   . Presence of  stent in coronary artery     4 STENTS   . TIA (transient ischemic attack)        Past Surgical History:   Procedure Laterality Date   . 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 CERVICAL FUSION     . CIRCUMCISION, > 13 YEARS, ADULT N/A 09/08/2014    Procedure: CIRCUMCISION, > 13 YEARS, ADULT;  Surgeon: Ellwood Handler, MD;  Location: [facility];  Service: Urology;  Laterality: N/A;  CIRCUMCISION   . CORONARY ANGIOPLASTY WITH STENT PLACEMENT      2 stents   . CORONARY ANGIOPLASTY WITH STENT PLACEMENT  12/13/12    DES to OM1, 2 stents   . LUMBAR LAMINECTOMY         Current Discharge Medication List      CONTINUE these medications which have NOT CHANGED    Details   acetaminophen (TYLENOL) 325 MG tablet Take 2 tablets (650 mg total) by mouth every 4 (four) hours as needed for Fever (temperature greater than 38.5 C).  Qty: 40 tablet, Refills: 0      amitriptyline (ELAVIL) 25 MG tablet Take 25 mg by mouth nightly.      aspirin EC 81 MG EC tablet Take 81 mg by mouth every morning.      atorvastatin (LIPITOR) 40  MG tablet Take 40 mg by mouth nightly.      dicyclomine (BENTYL) 20 MG tablet Take 1 tablet (20 mg total) by mouth 4 (four) times daily.  Qty: 30 tablet, Refills: 1      DULoxetine (CYMBALTA) 60 MG capsule Take 60 mg by mouth nightly.      fenofibrate (LOFIBRA) 160 MG tablet Take 160 mg by mouth every morning.      hydrochlorothiazide (HYDRODIURIL) 12.5 MG tablet Take 12.5 mg by mouth every morning.          hydrOXYzine (VISTARIL) 50 MG capsule Take 1 capsule (50 mg total) by mouth nightly.  Qty: 30 capsule, Refills: 0      insulin aspart (NOVOLOG) 100 UNIT/ML injection pen Inject 1-5 Units into the skin 4 times daily - with meals and at bedtime.  Qty: 15 mL, Refills: 0      Insulin Degludec (TRESIBA FLEXTOUCH) 100 UNIT/ML Solution Pen-injector Inject 50 Unit into the skin nightly.      isosorbide mononitrate (IMDUR) 30 MG 24 hr tablet Take 30 mg by mouth every morning.          metoprolol tartrate (LOPRESSOR) 100 MG tablet Take 100 mg by mouth 2 (two) times daily.      Multiple Vitamin (ONE-A-DAY MENS) Tab Take 1 tablet by mouth every morning.      nitroglycerin (NITROSTAT) 0.4 MG SL tablet Place 0.4 mg under the tongue every 5 (five) minutes as needed.      ranitidine (ZANTAC) 300 MG tablet Take 300 mg by mouth every morning.          ranolazine (RANEXA) 500 MG 12 hr tablet Take 500 mg by mouth 2 (two) times daily.      sitaGLIPtin (JANUVIA) 100 MG tablet Take 100 mg by mouth every morning.                 Allergies:   Allergies   Allergen Reactions   . Gabapentin      "Makes  crazy/hyper"   . Glucophage [Metformin Hydrochloride] Nausea And Vomiting   . Keflex [Cephalexin] Nausea And Vomiting   . Morphine      Hallucinations       Family History   Problem Relation Age of Onset   . Heart disease Mother    . Stroke Mother    . Diabetes Mother    . Heart attack Mother    . Heart disease Father    . Stroke Father    . Heart attack Father    . Diabetes Sister        SHx:  reports that he has never smoked. He has never  used smokeless tobacco. He reports that he drinks alcohol. He reports that he does not use drugs.    REVIEW OF SYSTEMS   Review of Systems   Constitutional: Positive for diaphoresis. Negative for chills and fever.   HENT: Negative.    Respiratory: Negative for shortness of breath.    Cardiovascular: Positive for chest pain and palpitations. Negative for leg swelling.   Gastrointestinal: Positive for nausea. Negative for vomiting.   Genitourinary: Negative.    Musculoskeletal: Negative.    Neurological: Positive for tingling.   Psychiatric/Behavioral: Negative.          PHYSICAL EXAM     Vital Signs: Blood pressure 147/84, pulse 98, temperature 98.3 F (36.8 C), temperature source Oral, resp. rate 16, height 1.727 m (5\' 8" ), weight 89 kg (196 lb 3.4 oz), SpO2 98 %.  Constitutional: Well developed, well nourished, active, in no apparent distress.  HENT:   Head: Normocephalic, atraumatic  Ears: No external lesions.  Nose: No external lesions. No epistaxis or drainage.  Eyes: PERRL. No scleral icterus. No conjunctival injection. EOMI.  Neck: Trachea is midline. No JVD. Normal range of motion. No apparent masses.  Cardiovascular: Regular rhythm, S1 normal and S2 normal.    No murmur heard.  Pulmonary/Chest: Effort normal. Lungs clear to auscultation bilaterally.   Abdominal: Soft, non-tender, non-distended. No masses.   Genitourinay/Anorectal: Defferred  Musculoskeletal: Normal range of motion. No deformity or apparent injury.   Neurological: Pt is alert. Cranial nerves are grossly intact. Moving all extremities without apparent deficit.   Psychiatric: Affect is appropriate. There is no agitation.   Skin: Skin is warm, dry, well perfused. No rash noted. No cyanosis. No pallor. No apparent wound.    LABS & IMAGING     Labs:  Results for orders placed or performed during the hospital encounter of 11/18/16   I-Stat Chem 8   Result Value Ref Range    I-STAT Notification Istat Notification    I-Stat Troponin   Result Value  Ref Range    I-STAT Notification Istat Notification    CBC and differential   Result Value Ref Range    WBC 5.1 4.0 - 11.0 K/cmm    RBC 5.00 4.00 - 5.70 M/cmm    Hemoglobin 14.7 13.0 - 17.5 gm/dL    Hematocrit 43.3 29.5 - 52.5 %    MCV 84 80 - 100 fL    MCH 30 28 - 35 pg    MCHC 35 32 - 36 gm/dL    RDW 18.8 41.6 - 60.6 %    PLT CT 185 130 - 440 K/cmm    MPV 7.0 6.0 - 10.0 fL    NEUTROPHIL % 52.5 42.0 - 78.0 %    Lymphocytes 37.6 15.0 - 46.0 %    Monocytes 6.0 3.0 - 15.0 %  Eosinophils % 2.9 0.0 - 7.0 %    Basophils % 1.0 0.0 - 3.0 %    Neutrophils Absolute 2.7 1.7 - 8.6 K/cmm    Lymphocytes Absolute 1.9 0.6 - 5.1 K/cmm    Monocytes Absolute 0.3 0.1 - 1.7 K/cmm    Eosinophils Absolute 0.1 0.0 - 0.8 K/cmm    BASO Absolute 0.1 0.0 - 0.3 K/cmm   PT/APTT   Result Value Ref Range    PT 11.0 9.5 - 11.5 sec    PT INR 1.1 0.5 - 1.3    aPTT 23.3 (L) 24.0 - 34.0 sec   i-Stat Chem 8 CartrIDge   Result Value Ref Range    i-STAT Sodium 140 136 - 147 mMol/L    i-STAT Potassium 4.2 3.5 - 5.3 mMol/L    i-STAT Chloride 102 98 - 110 mMol/L    TCO2, ISTAT 26 24 - 29 mMol/L    Ionized Ca, ISTAT 4.80 4.35 - 5.10 mg/dL    i-STAT Glucose 161 (H) 71 - 99 mg/dL    i-STAT Creatinine 0.96 (L) 0.80 - 1.30 mg/dL    i-STAT BUN 15 7 - 22 mg/dL    Anion Gap, ISTAT 04.5 (H) 7.0 - 16.0    EGFR 109 60 - 150 mL/min/1.39m2    i-STAT Hematocrit 41.0 39.0 - 52.5 %    i-STAT Hemoglobin 13.9 13.0 - 17.5 gm/dL   i-Stat Troponin   Result Value Ref Range    Trop I, ISTAT <0.02 0.00 - 0.02 ng/mL       Imaging:  XR Chest 2 Views   Final Result   No acute cardiopulmonary process.      ReadingStation:[id number]          EKG: Sinus tachycardia   Low voltage, extremity leads   Artifact in lead(s) I,III,aVR,aVL,aVF and baseline wander in lead(s) I,III,aVL   Compared to ECG 01/26/2015 15:31:30   Low QRS voltage now present   Sinus arrhythmia no longer present     EMERGENCY DEPARTMENT COURSE     ED Medication Orders     Start Ordered     Status Ordering Provider     11/18/16 2223 11/18/16 2223  aspirin chewable tablet 324 mg  Once in ED     Route: Oral  Ordered Dose: 324 mg     Acknowledged Lyndle Herrlich  A          ED documentation including nurses notes were reviewed by myself.  The case was discussed with the ED Attending.    ASSESSMENT & PLAN     Michael [name] is a 61 y.o. male admitted under OBSERVATION with   1. Chest pain  2. History of coronary artery disease  3. History of uncontrolled diabetes    Assessment & Plan:  1. Transferred to the observation unit.  2. Telemetry monitoring  3. Follow results of hemoglobin A1c  4. Serial cardiac enzymes  5. Pain medication, nitrates, and anti-emetics as needed  6. Plan for stress test in the morning  7. Plan for discharge tomorrow pending results of stress test.        I certify the need for admission to the Observation Unit based on the patient's history and the information above.    Lynda Rainwater, FNP   11/18/2016 10:52 PM

## 2016-11-19 ENCOUNTER — Observation Stay: Payer: 59

## 2016-11-19 LAB — VH DEXTROSE STICK GLUCOSE
Glucose POCT: 245 mg/dL — ABNORMAL HIGH (ref 71–99)
Glucose POCT: 277 mg/dL — ABNORMAL HIGH (ref 71–99)
Glucose POCT: 306 mg/dL — ABNORMAL HIGH (ref 71–99)

## 2016-11-19 LAB — TROPONIN I
Troponin I: <0.01 ng/mL (ref 0.00–0.02)
Troponin I: 0.01 ng/mL (ref 0.00–0.02)

## 2016-11-19 LAB — ECG 12-LEAD
P Wave Axis: 43 deg
P-R Interval: 183 ms
Patient Age: 60 years
Q-T Interval(Corrected): 427 ms
Q-T Interval: 321 ms
QRS Axis: 49 deg
QRS Duration: 91 ms
T Axis: 18 years
Ventricular Rate: 106 //min

## 2016-11-19 LAB — D-DIMER, QUANTITATIVE: D-Dimer: <0.19 mg/L FEU — ABNORMAL LOW (ref 0.19–0.52)

## 2016-11-19 MED ORDER — REGADENOSON 0.4 MG/5ML IV SOLN
INTRAVENOUS | Status: AC
Start: 2016-11-19 — End: ?
  Filled 2016-11-19: qty 5

## 2016-11-19 MED ORDER — REGADENOSON 0.4 MG/5ML IV SOLN
0.4000 mg | Freq: Once | INTRAVENOUS | Status: AC
Start: 2016-11-19 — End: 2016-11-19
  Administered 2016-11-19: 10:00:00 0.4 mg via INTRAVENOUS
  Filled 2016-11-19: qty 5

## 2016-11-19 MED ORDER — TECHNETIUM TC 99M SESTAMIBI - CARDIOLITE
11.0000 | Freq: Once | Status: AC | PRN
Start: 2016-11-19 — End: 2016-11-19
  Administered 2016-11-19: 07:00:00 11 via INTRAVENOUS

## 2016-11-19 MED ORDER — LIDOCAINE 5 % EX PTCH
3.0000 | MEDICATED_PATCH | CUTANEOUS | Status: DC
Start: 2016-11-19 — End: 2016-11-19
  Administered 2016-11-19: 02:00:00 3 via TRANSDERMAL
  Filled 2016-11-19 (×2): qty 3

## 2016-11-19 MED ORDER — TECHNETIUM TC 99M SESTAMIBI - CARDIOLITE
30.0000 | Freq: Once | Status: AC | PRN
Start: 2016-11-19 — End: 2016-11-19
  Administered 2016-11-19: 09:00:00 30 via INTRAVENOUS

## 2016-11-19 NOTE — Plan of Care (Signed)
Problem: Chest Pain  Goal: Vital signs and cardiac rhythm stable  Outcome: Progressing   11/19/16 0122   Goal/Interventions addressed this shift   Vital signs and cardiac rhythm stable Monitor /assess vital signs/cardiac rhythms;Assess the need for oxygen therapy and administer as ordered;Monitor labs      11/19/16 0122   Goal/Interventions addressed this shift   Vital signs and cardiac rhythm stable Monitor Earnest Bailey vital signs/cardiac rhythms;Assess the need for oxygen therapy and administer as ordered;Monitor labs     Goal: Cardiac pain management  Outcome: Progressing   11/19/16 0122   Goal/Interventions addressed this shift   Cardiac pain management  Assess/report chest pain/or related discomfort to LIP immediately;Instruct patient to report any change in pain status;Assess pain/or related discomfort on admission, during daily assessment, before and after any intervention;Include patient and patient care companion in decisions related to pain manage   11/19/16 0122   Goal/Interventions addressed this shift   Cardiac pain management  Assess/report chest pain/or related discomfort to LIP immediately;Instruct patient to report any change in pain status;Assess pain/or related discomfort on admission, during daily assessment, before and after any intervention;Include patient and patient care companion in decisions related to pain management   ment     Goal: Anxiety management/effective coping  Outcome: Progressing    Goal: Patient/Patient Care Companion demonstrates understanding of disease process, treatment plan, medications, and discharge plan  Outcome: Progressing   11/19/16 0122   Goal/Interventions addressed this shift   Patient/Patient Care Companion demonstrates understanding of disease process, treatment plan, medications and discharge plan  Educate patient to immediately report any chest pain/equivalent to RN;Reinforce with patient their activity level and to avoid Valsalva;Reinforce regarding cardiac diet  and any fluid parameters;Assist patient/patient care companion to identify measures for cardiac risk factor management;Consult/collaborate with Cardiac Rehabilitation;Educate patient/patient care companion regarding identified learning needs

## 2016-11-19 NOTE — Progress Notes (Signed)
At 0405 pt hit call light stating that he was having sharp 9/10 chest pain that was radiating from his chest to his back. Pt was visibly diaphoretic and tachypneic but stated that he didn't feel short of breath. Pt stated that this pain started seconds prior to him hitting his call light. Pt was given a SL nitro at 0407 with minimal improvement, down to a 7/10. Pt given second SL nitro at 0412 this brought pt's pain down to a 5/10. After 5 minutes pt was given a 3rd nitro that eventually brought him down to a 1/10. EKG was obtained and taken to Dr. Nechama Guard for review. Pt states that he is now pain free. EDT at bedside obtaining blood that Dr Nechama Guard ordered at this time.

## 2016-11-19 NOTE — Discharge Summary (Signed)
[facility]  OBSERVATION UNIT  DISCHARGE SUMMARY      Patient: Michael [name]  Admission Date: 11/18/2016  9:13 PM   DOB: 12-20-1955  Discharge Date: 11/19/16 1:53 PM   MRN: [id number]  Primary Care: Myrtie Soman, DO         Presentation     Michael [name] is a 61 y.o. male who presented with chest pain. Patient was seen at [facility] on Tuesday for palpitations and had a negative workup. Then today he was driving in his car when he became nauseous and diaphoretic and had to pull over because he thought he was going to vomit. He had left-sided tingling in his shoulder and neck with chest pressure. He had a left heart catheter in 2014 with Dr. Howie Ill with 1 stent placed. He had a stress test in January 2018  That was read as low risk with a moderate in severity fixed apical lateral wall perfusion defect. His first troponin downstairs was negative at less than 0.02, his chemistry panel was significant for a blood sugar of 360 and an hemoglobin A1c is pending. There is also a lipid panel pending.his EKG in the ER was read as sinus tachycardia with low voltage in the extremity leads and artifact with baseline wander. The case was discussed with Dr. Nechama Guard and it was decided to repeat the stress test. He will be transferred to the observation unit for serial cardiac enzymes, telemetry monitoring, and stress test in the morning. He will receive pain medication, nitrates, and anti-emetics as needed.    Patient was admitted to the Observation Unit with a diagnosis of   Patient Active Problem List   Diagnosis   . CAD (coronary artery disease)   . Chest pain   . Glucosuria   . BPH (benign prostatic hyperplasia)   . Balanitis   . Cellulitis of finger   . Type 2 diabetes mellitus   . Hypertension   . Cellulitis of middle finger, left              Hospital Course     Vitals:    11/19/16 1311   BP:    Pulse: 81   Resp: 17   Temp:    SpO2: 99%       Patient is transferred observation and monitored on  cardiac telemetry. She did not have return of discomfort for which he presented. His hemoglobin A1c is trending down is 10.7 and it was 13 earlier this year. He does see his doctor regularly and things have been altered and adjusted to help him with this glucose control. He states it has been a year since he's seen Dr. Howie Ill. His nuclear stress test was negative and he states he will follow-up in the office. He'll also seek follow-up with his primary care.         Ancillary Studies     Labs:  Results for orders placed or performed during the hospital encounter of 11/18/16   I-Stat Chem 8   Result Value Ref Range    I-STAT Notification Istat Notification    I-Stat Troponin   Result Value Ref Range    I-STAT Notification Istat Notification    CBC and differential   Result Value Ref Range    WBC 5.1 4.0 - 11.0 K/cmm    RBC 5.00 4.00 - 5.70 M/cmm    Hemoglobin 14.7 13.0 - 17.5 gm/dL    Hematocrit 54.0 98.1 - 52.5 %    MCV  84 80 - 100 fL    MCH 30 28 - 35 pg    MCHC 35 32 - 36 gm/dL    RDW 47.8 29.5 - 62.1 %    PLT CT 185 130 - 440 K/cmm    MPV 7.0 6.0 - 10.0 fL    NEUTROPHIL % 52.5 42.0 - 78.0 %    Lymphocytes 37.6 15.0 - 46.0 %    Monocytes 6.0 3.0 - 15.0 %    Eosinophils % 2.9 0.0 - 7.0 %    Basophils % 1.0 0.0 - 3.0 %    Neutrophils Absolute 2.7 1.7 - 8.6 K/cmm    Lymphocytes Absolute 1.9 0.6 - 5.1 K/cmm    Monocytes Absolute 0.3 0.1 - 1.7 K/cmm    Eosinophils Absolute 0.1 0.0 - 0.8 K/cmm    BASO Absolute 0.1 0.0 - 0.3 K/cmm   PT/APTT   Result Value Ref Range    PT 11.0 9.5 - 11.5 sec    PT INR 1.1 0.5 - 1.3    aPTT 23.3 (L) 24.0 - 34.0 sec   Hemoglobin A1C   Result Value Ref Range    Hgb A1C, % 10.7 %   Lipid Panel   Result Value Ref Range    Cholesterol 101 75 - 199 mg/dL    Triglycerides 308 (H) 10 - 150 mg/dL    HDL 31 (L) 40 - 55 mg/dL    LDL Calculated 19 mg/dL    Coronary Heart Disease Risk 3.26     VLDL 51 (H) 0 - 40   Troponin I   Result Value Ref Range    Troponin I <0.01 0.00 - 0.02 ng/mL   Troponin I    Result Value Ref Range    Troponin I 0.01 0.00 - 0.02 ng/mL   Dextrose Stick Glucose   Result Value Ref Range    Glucose, POCT 306 (H) 71 - 99 mg/dL   D-dimer, quantitative   Result Value Ref Range    D-Dimer <0.19 (L) 0.19 - 0.52 mg/L FEU   Dextrose Stick Glucose   Result Value Ref Range    Glucose, POCT 277 (H) 71 - 99 mg/dL   Dextrose Stick Glucose   Result Value Ref Range    Glucose, POCT 245 (H) 71 - 99 mg/dL   i-Stat Chem 8 CartrIDge   Result Value Ref Range    i-STAT Sodium 140 136 - 147 mMol/L    i-STAT Potassium 4.2 3.5 - 5.3 mMol/L    i-STAT Chloride 102 98 - 110 mMol/L    TCO2, ISTAT 26 24 - 29 mMol/L    Ionized Ca, ISTAT 4.80 4.35 - 5.10 mg/dL    i-STAT Glucose 657 (H) 71 - 99 mg/dL    i-STAT Creatinine 8.46 (L) 0.80 - 1.30 mg/dL    i-STAT BUN 15 7 - 22 mg/dL    Anion Gap, ISTAT 96.2 (H) 7.0 - 16.0    EGFR 109 60 - 150 mL/min/1.32m2    i-STAT Hematocrit 41.0 39.0 - 52.5 %    i-STAT Hemoglobin 13.9 13.0 - 17.5 gm/dL   i-Stat Troponin   Result Value Ref Range    Trop I, ISTAT <0.02 0.00 - 0.02 ng/mL       Radiology:  NM Myocardial Perfusion Spect (Stress And Rest)   Final Result      XR Chest 2 Views   Final Result   No acute cardiopulmonary process.      ReadingStation:[id number]  CV Cardiac Stress Test Tracing Only    (Results Pending)       Stress Test Results:          Disposition     Diagnosis:   Patient Active Problem List    Diagnosis Date Noted   . Cellulitis of finger 10/22/2014   . Type 2 diabetes mellitus 10/22/2014   . Hypertension 10/22/2014   . Cellulitis of middle finger, left 10/22/2014   . Glucosuria 09/08/2014   . BPH (benign prostatic hyperplasia) 09/08/2014   . Balanitis 09/08/2014   . Chest pain 03/18/2013   . CAD (coronary artery disease) 12/13/2012       Past Medical History:   Diagnosis Date   . CAD (coronary artery disease)    . Cellulitis of finger of left hand 10/22/14   . Diabetes mellitus    . Gastroesophageal reflux disease    . Hernia    . Hyperlipidemia    .  Hypertension    . Myocardial infarction    . Neuropathy     diabetic   . Presence of stent in coronary artery     4 STENTS   . TIA (transient ischemic attack)        Discharge to: Home    Condition: Stable    Continue all of your usual medications.    Continue to work on your blood glucose control; it is trending down, good work!    Please follow up with Dr. Howie Ill.     Please follow up with your primary care doctor.    Medication(s):  Current Discharge Medication List      CONTINUE these medications which have NOT CHANGED    Details   aspirin EC 81 MG EC tablet Take 81 mg by mouth every morning.      atorvastatin (LIPITOR) 40 MG tablet Take 40 mg by mouth nightly.      hydrOXYzine (VISTARIL) 50 MG capsule Take 1 capsule (50 mg total) by mouth nightly.  Qty: 30 capsule, Refills: 0      Insulin Degludec (TRESIBA FLEXTOUCH) 100 UNIT/ML Solution Pen-injector Inject 50 Unit into the skin nightly.      isosorbide mononitrate (IMDUR) 30 MG 24 hr tablet Take 30 mg by mouth every morning.          lidocaine (LIDODERM) 5 % Place 3 patches onto the skin daily.Remove & Discard patch within 12 hours or as directed by MD      metoprolol tartrate (LOPRESSOR) 100 MG tablet Take 100 mg by mouth 2 (two) times daily.      Multiple Vitamin (ONE-A-DAY MENS) Tab Take 1 tablet by mouth every morning.      acetaminophen (TYLENOL) 325 MG tablet Take 2 tablets (650 mg total) by mouth every 4 (four) hours as needed for Fever (temperature greater than 38.5 C).  Qty: 40 tablet, Refills: 0      dicyclomine (BENTYL) 20 MG tablet Take 1 tablet (20 mg total) by mouth 4 (four) times daily.  Qty: 30 tablet, Refills: 1      nitroglycerin (NITROSTAT) 0.4 MG SL tablet Place 0.4 mg under the tongue every 5 (five) minutes as needed.             Current Discharge Medication List          Follow Up:

## 2016-11-19 NOTE — Discharge Instr - AVS First Page (Signed)
Continue all of your usual medications.    Continue to work on your blood glucose control; it is trending down, good work!    Please follow up with Dr. Howie Ill.     Please follow up with your primary care doctor.

## 2016-11-19 NOTE — Plan of Care (Signed)
Problem: Chest Pain  Goal: Vital signs and cardiac rhythm stable  Outcome: Progressing   11/19/16 1202   Goal/Interventions addressed this shift   Vital signs and cardiac rhythm stable Monitor Michael [name] vital signs/cardiac rhythms;Monitor labs;Assess the need for oxygen therapy and administer as ordered     Goal: Cardiac pain management  Outcome: Progressing   11/19/16 1202   Goal/Interventions addressed this shift   Cardiac pain management  Assess/report chest pain/or related discomfort to LIP immediately;Instruct patient to report any change in pain status;Assess pain/or related discomfort on admission, during daily assessment, before and after any intervention;Include patient and patient care companion in decisions related to pain management     Goal: Anxiety management/effective coping  Outcome: Progressing   11/19/16 1202   Goal/Interventions addressed this shift   Anxiety management/effective coping  Assess/report uncontrolled anxiety, depression or ineffective coping to LIP;Offer reassurance to decrease anxiety;Encourage patient to immediately report any increase in anxiety and/or depression     Goal: Patient/Patient Care Companion demonstrates understanding of disease process, treatment plan, medications, and discharge plan  Outcome: Progressing   11/19/16 1202   Goal/Interventions addressed this shift   Patient/Patient Care Companion demonstrates understanding of disease process, treatment plan, medications and discharge plan  Educate patient to immediately report any chest pain/equivalent to RN;Reinforce with patient their activity level and to avoid Valsalva;Reinforce regarding cardiac diet and any fluid parameters;Assist patient/patient care companion to identify measures for cardiac risk factor management;Educate patient/patient care companion regarding identified learning needs;Provide appropriate resources and referrals;Plan for discharge       Problem: Moderate/High Fall Risk Score >5  Goal: Patient  will remain free of falls   11/19/16 1202   OTHER   Moderate Risk (6-13) LOW-Fall Interventions Appropriate for Low Fall Risk

## 2016-11-19 NOTE — Progress Notes (Addendum)
Pt returned from testing. Blood glucose of 277; states he was given Coca-Cola during testing rather than diet Coca-Cola. Will cover with sliding scale insulin once meal tray arrives. Pt also reports "chest heaviness that radiates around to his back." Offered pain medication, but pt requests to wait at this time.

## 2016-11-19 NOTE — Progress Notes (Signed)
Admission assessment completed.  Patient denies chest pain at this time No SOB.  VSS. Patient in no acute distress.

## 2016-11-19 NOTE — Progress Notes (Signed)
Received report from previous RN. Pt placed on portable tele monitor and transported to stress testing.

## 2016-11-21 NOTE — UM Notes (Addendum)
obs admit on 11/18/16    Per h/p-"60 y.o. male who presented to the Emergency Room with chest pain. Patient was seen at [facility] on Tuesday for palpitations and had a negative workup. Then today he was driving in his car when he became nauseous and diaphoretic and had to pull over because he thought he was going to vomit. He had left-sided tingling in his shoulder and neck with chest pressure. He had a left heart catheter in 2014 with Dr. Howie Ill with 1 stent placed. He had a stress test in January 2018  That was read as low risk with a moderate in severity fixed apical lateral wall perfusion defect. His first troponin downstairs was negative at less than 0.02, his chemistry panel was significant for a blood sugar of 360 and an hemoglobin A1c is pending. There is also a lipid panel pending.his EKG in the ER was read as sinus tachycardia with low voltage in the extremity leads and artifact with baseline wander. The case was discussed with Dr. Nechama Guard and it was decided to repeat the stress test. He will be transferred to the observation unit for serial cardiac enzymes, telemetry monitoring, and stress test in the morning. He will receive pain medication, nitrates, and anti-emetics as needed."    PMH-   CAD (coronary artery disease)     . Cellulitis of finger of left hand 10/22/14   . Diabetes mellitus    . Gastroesophageal reflux disease    . Hernia    . Hyperlipidemia    . Hypertension    . Myocardial infarction    . Neuropathy     diabetic   . Presence of stent in coronary artery     4 STENTS   . TIA (transient ischemic attack)      PSH-   AMPUTATION, FINGER Left 02/26/2015     Procedure: AMPUTATION, FINGER;  Surgeon: Lujean Rave, MD;  Location: [facility];  Service: Orthopedics;  Laterality: Left;  Amputation left long finger   . CERVICAL FUSION     . CIRCUMCISION, > 13 YEARS, ADULT N/A 09/08/2014    Procedure: CIRCUMCISION, > 13 YEARS, ADULT;  Surgeon: Ellwood Handler, MD;   Location: [facility];  Service: Urology;  Laterality: N/A;  CIRCUMCISION   . CORONARY ANGIOPLASTY WITH STENT PLACEMENT      2 stents   . CORONARY ANGIOPLASTY WITH STENT PLACEMENT  12/13/12    DES to OM1, 2 stents   . LUMBAR LAMINECTOMY         Assessment and plan  "Michael [name] is a 61 y.o. male admitted under OBSERVATION with   1. Chest pain  2. History of coronary artery disease  3. History of uncontrolled diabetes    Assessment & Plan:  1. Transferred to the observation unit.  2. Telemetry monitoring  3. Follow results of hemoglobin A1c  4. Serial cardiac enzymes  5. Pain medication, nitrates, and anti-emetics as needed  6. Plan for stress test in the morning  7. Plan for discharge tomorrow pending results of stress test."    98.3-105-98%-17-147/84    norco prn   Insulin sc  NTG SL x 3 on 8/4    Hgb A1C, % 10.7  %  Trop <0.02   BS 360      Stress test on 8/4  "CONCLUSIONS  Risk Assessment: Low    Abnormal but low risk lexiscan nuclear stress test. "      RN note on 8/4 at 0432 AM  "At 0405 pt hit call light stating that he was having sharp 9/10 chest pain that was radiating from his chest to his back. Pt was visibly diaphoretic and tachypneic but stated that he didn't feel short of breath. Pt stated that this pain started seconds prior to him hitting his call light. Pt was given a SL nitro at 0407 with minimal improvement, down to a 7/10. Pt given second SL nitro at 0412 this brought pt's pain down to a 5/10. After 5 minutes pt was given a 3rd nitro that eventually brought him down to a 1/10. EKG was obtained and taken to Dr. Nechama Guard for review. Pt states that he is now pain free. EDT at bedside obtaining blood that Dr Nechama Guard ordered at this time. "    Discharged home on 11/19/16    Michael [name]  DOB 1956-04-16  ID# [id number]    Versie Starks RN [facility]  Phone [phone]  Fax [phone]

## 2016-11-29 ENCOUNTER — Ambulatory Visit: Payer: 59 | Attending: Primary Podiatric Medicine | Admitting: Primary Podiatric Medicine

## 2016-11-29 ENCOUNTER — Encounter (RURAL_HEALTH_CENTER): Payer: Self-pay | Admitting: Primary Podiatric Medicine

## 2016-11-29 VITALS — HR 106 | Temp 98.7°F | Resp 18 | Ht 68.0 in | Wt 192.0 lb

## 2016-11-29 DIAGNOSIS — E1142 Type 2 diabetes mellitus with diabetic polyneuropathy: Secondary | ICD-10-CM

## 2016-11-29 DIAGNOSIS — B351 Tinea unguium: Secondary | ICD-10-CM

## 2016-11-29 DIAGNOSIS — M79672 Pain in left foot: Secondary | ICD-10-CM

## 2016-12-05 ENCOUNTER — Encounter (RURAL_HEALTH_CENTER): Payer: Self-pay | Admitting: Primary Podiatric Medicine

## 2016-12-05 NOTE — Progress Notes (Signed)
Subjective:    Patient ID: Michael [name] is a 61 y.o. male.    Patient's a with a history of diabetes CAD hypertension in recent treatment for chest pain is seen for exam and treatment of his lower extremity on. Patient's A1c earlier this month was a 10.7 on. Patient relates that he had some mild redness at the proximal aspect of this and nail fold on the cell right at toenail but given antibiotics and this seemed to help this area. On patient complains of burning and numbness across his bilateral lower extremities Wells occasional him back pain on. Patient relates that it just taking gabapentin in the past but had a bad reaction to this medication on. Denies any open lesions or drainage at today on. Does complain of dystrophic a tender nails over this lower extremity on.             Review of Systems   Constitutional: Negative.    HENT: Negative.    Respiratory: Negative.    Cardiovascular: Negative for chest pain.   Gastrointestinal: Negative.    Genitourinary: Negative.    Musculoskeletal: Negative.    Skin: Negative.    Allergic/Immunologic: Negative.    Neurological: Positive for numbness.           Objective:    Physical Exam  Patient with warm dry thin skin over this lower extremity on. On patient with nails that are thick dystrophic discolored and elongated 10. Patient with this right great toenail which does appear a stable today with little bit of redness at this proximal aspect but no drainage or acute signs of infection on. Patient with the decreased O pulses over this lower extremity secondary to edema but without ischemic changes or claudication today on. Patient with protective sensation decrease this lower extremity with numbness of burning and occasional pain on bilaterally on. Patient without acute pain on palpation today on.      Assessment:       1. Diabetic peripheral neuropathy associated with type 2 diabetes mellitus    2. Onychomycosis    3. Pain in both feet          Plan:       Phone  consent we did debride nails times a 10 without incident on. Preop this a proximal aspect of this nail and it was no collection of purulence at today on. Advised to continue to monitor this area closely and call should this area become problematic once again on. Discussed the importer improving his blood glucose control to prevent the complications on.

## 2016-12-12 ENCOUNTER — Ambulatory Visit (INDEPENDENT_AMBULATORY_CARE_PROVIDER_SITE_OTHER): Payer: MEDICAID | Admitting: Cardiovascular Disease

## 2016-12-12 ENCOUNTER — Encounter (INDEPENDENT_AMBULATORY_CARE_PROVIDER_SITE_OTHER): Payer: Self-pay | Admitting: Cardiovascular Disease

## 2016-12-12 VITALS — BP 110/74 | HR 86 | Resp 17 | Ht 68.0 in | Wt 189.0 lb

## 2016-12-12 DIAGNOSIS — G4733 Obstructive sleep apnea (adult) (pediatric): Secondary | ICD-10-CM

## 2016-12-12 DIAGNOSIS — Z6828 Body mass index (BMI) 28.0-28.9, adult: Secondary | ICD-10-CM

## 2016-12-12 DIAGNOSIS — I10 Essential (primary) hypertension: Secondary | ICD-10-CM

## 2016-12-12 DIAGNOSIS — I679 Cerebrovascular disease, unspecified: Secondary | ICD-10-CM

## 2016-12-12 DIAGNOSIS — I2510 Atherosclerotic heart disease of native coronary artery without angina pectoris: Secondary | ICD-10-CM
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
## 2016-12-13 ENCOUNTER — Emergency Department (HOSPITAL_COMMUNITY): Payer: MEDICAID

## 2016-12-13 ENCOUNTER — Emergency Department
Admission: EM | Admit: 2016-12-13 | Discharge: 2016-12-13 | Disposition: A | Discharge status: Home / Self Care | Payer: MEDICAID | Attending: Family Medicine | Admitting: Family Medicine

## 2016-12-13 ENCOUNTER — Encounter (HOSPITAL_COMMUNITY): Payer: Self-pay

## 2016-12-13 DIAGNOSIS — Z881 Allergy status to other antibiotic agents status: Secondary | ICD-10-CM | POA: Insufficient documentation

## 2016-12-13 DIAGNOSIS — Z794 Long term (current) use of insulin: Secondary | ICD-10-CM | POA: Insufficient documentation

## 2016-12-13 DIAGNOSIS — I10 Essential (primary) hypertension: Secondary | ICD-10-CM | POA: Insufficient documentation

## 2016-12-13 DIAGNOSIS — E119 Type 2 diabetes mellitus without complications: Secondary | ICD-10-CM | POA: Insufficient documentation

## 2016-12-13 DIAGNOSIS — Z8673 Personal history of transient ischemic attack (TIA), and cerebral infarction without residual deficits: Secondary | ICD-10-CM | POA: Insufficient documentation

## 2016-12-13 DIAGNOSIS — R42 Dizziness and giddiness: Secondary | ICD-10-CM | POA: Insufficient documentation

## 2016-12-13 DIAGNOSIS — I2510 Atherosclerotic heart disease of native coronary artery without angina pectoris: Secondary | ICD-10-CM | POA: Insufficient documentation

## 2016-12-13 DIAGNOSIS — R51 Headache: Secondary | ICD-10-CM | POA: Insufficient documentation

## 2016-12-13 DIAGNOSIS — E785 Hyperlipidemia, unspecified: Secondary | ICD-10-CM | POA: Insufficient documentation

## 2016-12-13 DIAGNOSIS — Z79899 Other long term (current) drug therapy: Secondary | ICD-10-CM | POA: Insufficient documentation

## 2016-12-13 DIAGNOSIS — K219 Gastro-esophageal reflux disease without esophagitis: Secondary | ICD-10-CM | POA: Insufficient documentation

## 2016-12-13 DIAGNOSIS — Z885 Allergy status to narcotic agent status: Secondary | ICD-10-CM | POA: Insufficient documentation

## 2016-12-13 DIAGNOSIS — Z7982 Long term (current) use of aspirin: Secondary | ICD-10-CM | POA: Insufficient documentation

## 2016-12-13 HISTORY — DX: Presence of spectacles and contact lenses: Z97.3

## 2016-12-13 LAB — CBC WITH DIFF
BASOPHIL #: 0.01 10*3/uL (ref 0.00–0.10)
BASOPHIL #: 0.01 x10ˆ3/uL (ref 0.00–0.10)
BASOPHIL %: 0 % (ref 0–2)
EOSINOPHIL #: 0.15 x10ˆ3/uL (ref 0.00–0.40)
EOSINOPHIL %: 4 % (ref 0–6)
HCT: 39.5 % — ABNORMAL LOW (ref 42.0–52.0)
HGB: 14.1 g/dL (ref 14.0–18.0)
LYMPHOCYTE #: 1.74 10*3/uL (ref 1.00–3.70)
LYMPHOCYTE %: 42 % (ref 26–54)
MCH: 29.7 pg (ref 24.0–32.0)
MCHC: 35.7 g/dL (ref 33.0–37.0)
MCV: 83.2 fL (ref 80.0–100.0)
MONOCYTE #: 0.4 x10ˆ3/uL (ref 0.00–2.00)
MONOCYTE %: 10 % (ref 0–10)
MPV: 9 fL (ref 7.4–10.4)
NEUTROPHIL #: 1.85 x10ˆ3/uL (ref 1.50–7.00)
NEUTROPHIL %: 45 % (ref 27–75)
PLATELETS: 174 10*3/uL (ref 130–400)
RBC: 4.75 x10ˆ6/uL (ref 4.70–6.10)
RDW-CV: 13.9 FL
RDW-SD: 41.3 %
WBC: 4.2 x10ˆ3/uL — ABNORMAL LOW (ref 4.8–10.8)

## 2016-12-13 LAB — CREATINE KINASE (CK), TOTAL, SERUM OR PLASMA: CREATINE KINASE: 95 U/L (ref 45–225)

## 2016-12-13 LAB — TROPONIN-I: TROPONIN I: <=0.01 ng/mL (ref <=0.03)

## 2016-12-13 LAB — COMPREHENSIVE METABOLIC PANEL, NON-FASTING
ALBUMIN: 3.8 g/dL (ref 3.5–5.0)
ALKALINE PHOSPHATASE: 49 U/L (ref 38–126)
ALT (SGPT): 42 U/L (ref 21–72)
ANION GAP: 8 mmol/L — ABNORMAL LOW (ref 10–20)
AST (SGOT): 26 U/L (ref 17–59)
BILIRUBIN TOTAL: 3.1 mg/dL — ABNORMAL HIGH (ref 0.3–1.3)
BUN/CREA RATIO: 14
BUN: 11 mg/dL (ref 8–25)
CALCIUM: 9 mg/dL (ref 8.5–10.4)
CHLORIDE: 104 mmol/L (ref 98–107)
CO2 TOTAL: 28 mmol/L (ref 22–32)
CREATININE: 0.8 mg/dL (ref 0.62–1.27)
ESTIMATED GFR: 98 mL/min/1.73mˆ2
GLUCOSE: 302 mg/dL — ABNORMAL HIGH (ref 70–105)
POTASSIUM: 4.8 mmol/L (ref 3.5–5.1)
PROTEIN TOTAL: 6.6 g/dL (ref 6.0–8.0)
SODIUM: 140 mmol/L (ref 137–145)

## 2016-12-13 LAB — BILIRUBIN, CONJUGATED (DIRECT)
BILIRUBIN DIRECT: 0.4 mg/dL — ABNORMAL HIGH (ref 0.0–0.2)
BILIRUBIN DIRECT: 0.4 mg/dL — ABNORMAL HIGH (ref 0.0–0.2)

## 2016-12-13 MED ORDER — MECLIZINE 12.5 MG TABLET
12.50 mg | ORAL_TABLET | ORAL | Status: AC
Start: 2016-12-13 — End: 2016-12-13
  Administered 2016-12-13: 12.5 mg via ORAL
  Filled 2016-12-13: qty 1

## 2016-12-13 MED ORDER — MECLIZINE 12.5 MG TABLET: 13 mg | Tab | Freq: Three times a day (TID) | ORAL | 0 refills | 0 days | Status: AC | PRN

## 2016-12-13 MED ORDER — SODIUM CHLORIDE 0.9 % (FLUSH) INJECTION SYRINGE
INJECTION | INTRAMUSCULAR | Status: AC
Start: 2016-12-13 — End: 2016-12-13
  Administered 2016-12-13: 6 mL
  Filled 2016-12-13: qty 6

## 2016-12-13 MED ORDER — SODIUM CHLORIDE 0.9 % IV BOLUS
1000.00 mL | INJECTION | Status: AC
Start: 2016-12-13 — End: 2016-12-13
  Administered 2016-12-13 (×2): 1000 mL via INTRAVENOUS
  Administered 2016-12-13: 0 mL via INTRAVENOUS
  Filled 2016-12-13: qty 1000

## 2016-12-13 MED ADMIN — sodium chloride 0.9 % (flush) injection syringe: INTRAVENOUS | @ 15:00:00
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
## 2016-12-13 NOTE — ED Nurses Note (Signed)
Patient returned from radiology. Connected to cardiac monitors. Patient has no new complaints at this time and is resting quietly in bed.

## 2016-12-13 NOTE — ED Triage Notes (Signed)
Patient reports that he had a sudden onset of dizziness and blurred vision. Patient reports that he became flushed at that time. Patient is currently complaining of headache and upper left quad abdominal pain.

## 2016-12-13 NOTE — Discharge Instructions (Signed)
Meclizine as prescribed.  Rest.  Fluids.  Follow up PCP within 1 week.

## 2016-12-13 NOTE — Nurses Notes (Signed)
Pt discharged at this time in stable condition. A cab was called on pt's behalf as he has no family in the area. Pt ambulatory. AVS reviewed, no further questions, discharge signature received.

## 2016-12-15 ENCOUNTER — Telehealth (RURAL_HEALTH_CENTER): Payer: Self-pay

## 2016-12-15 NOTE — Telephone Encounter (Signed)
May have discussed Custom Diabetic Shoes but can discuss at follow up.    Thanks.

## 2016-12-15 NOTE — Telephone Encounter (Signed)
Returning patients call from my phone - states that at the last office visit Dr. Kathrin Greathouse mentioned he needed to be fitted for a brace? I didn't see any documentation, or any papers/scanned media re: this... Do you want patient to just keep follow up appt with you, or do you remember suggesting this?

## 2016-12-20 NOTE — Telephone Encounter (Signed)
Patient is not accepting calls at this time. Will try again later.

## 2016-12-21 NOTE — Telephone Encounter (Signed)
Patient not accepting calls at this time. Will try again.

## 2016-12-22 NOTE — Telephone Encounter (Signed)
Pt notified, voiced understanding. No further questions

## 2017-01-15 ENCOUNTER — Inpatient Hospital Stay (EMERGENCY_DEPARTMENT_HOSPITAL)
Admission: EM | Admit: 2017-01-15 | Discharge: 2017-01-15 | Disposition: A | Discharge status: Home / Self Care | Payer: MEDICAID

## 2017-01-15 DIAGNOSIS — L600 Ingrowing nail: Secondary | ICD-10-CM

## 2017-01-28 ENCOUNTER — Inpatient Hospital Stay (EMERGENCY_DEPARTMENT_HOSPITAL)
Admission: EM | Admit: 2017-01-28 | Discharge: 2017-01-28 | Disposition: A | Discharge status: Home / Self Care | Payer: MEDICAID

## 2017-01-28 DIAGNOSIS — R079 Chest pain, unspecified: Secondary | ICD-10-CM

## 2017-02-21 ENCOUNTER — Encounter (RURAL_HEALTH_CENTER): Payer: Self-pay | Admitting: Primary Podiatric Medicine

## 2017-02-21 ENCOUNTER — Ambulatory Visit: Payer: 59 | Attending: Primary Podiatric Medicine | Admitting: Primary Podiatric Medicine

## 2017-02-21 DIAGNOSIS — B351 Tinea unguium: Secondary | ICD-10-CM

## 2017-02-21 DIAGNOSIS — M79671 Pain in right foot: Secondary | ICD-10-CM

## 2017-02-21 DIAGNOSIS — G63 Polyneuropathy in diseases classified elsewhere: Secondary | ICD-10-CM

## 2017-02-21 DIAGNOSIS — E1142 Type 2 diabetes mellitus with diabetic polyneuropathy: Secondary | ICD-10-CM

## 2017-02-21 DIAGNOSIS — M79672 Pain in left foot: Secondary | ICD-10-CM

## 2017-02-21 NOTE — Progress Notes (Signed)
Subjective:    Patient ID: Michael [name] is a 61 y.o. male.    Patient's a returns to clinic with a history of an diabetes CAD and neck continued to have significant a painful a polyneuropathy on. Patient has been seen in neurologist and was started on Lyrica on. Patient relates it is been working on his blood glucose but is not had a recent A1c in his last was in the tenderness on. Patient denies any open lesions or drainage at today on. Does complain of dystrophic a tender nails which are difficult for him to manage on.        The following portions of the patient's history were reviewed and updated as appropriate: allergies, current medications, past family history, past medical history, past social history, past surgical history and problem list.    Review of Systems   Constitutional: Negative.    Musculoskeletal: Positive for myalgias.   Skin: Negative.    Neurological: Positive for numbness.   All other systems reviewed and are negative.          Objective:    Physical Exam  Patient with warm dry supple skin over this lower extremity on. Patient with nails that are thick dystrophic discolored and elongated times a 10 with some incurvation noted over this left great toenail on. Patient with relief with debridement on. Patient with thumb palpable pulses with mild edema but without ischemic changes or claudication on. Patient protective sensation decrease this lower extremity with burning and numbness throughout some. Patient without acute pain on palpation on.      Assessment:       1. Diabetic peripheral neuropathy associated with type 2 diabetes mellitus    2. Onychomycosis    3. 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 Pain in both feet          Plan:       Following consent we did debridement done nails times a 10 without incident today on. Discussed import of regular foot exams prevent the complications on. Advised to call with worsening concerns or complaints on.

## 2017-03-04 ENCOUNTER — Inpatient Hospital Stay (EMERGENCY_DEPARTMENT_HOSPITAL)
Admission: EM | Admit: 2017-03-04 | Discharge: 2017-03-04 | Disposition: A | Discharge status: Home / Self Care | Payer: MEDICAID

## 2017-03-04 DIAGNOSIS — G459 Transient cerebral ischemic attack, unspecified: Secondary | ICD-10-CM

## 2017-03-05 ENCOUNTER — Inpatient Hospital Stay
Admission: EM | Admit: 2017-03-05 | Discharge: 2017-03-08 | DRG: 103 | Disposition: A | Discharge status: Home Health | Payer: 59 | Source: Other Acute Inpatient Hospital | Attending: Internal Medicine | Admitting: Internal Medicine

## 2017-03-05 ENCOUNTER — Encounter: Payer: Self-pay | Admitting: Internal Medicine

## 2017-03-05 ENCOUNTER — Observation Stay: Payer: 59

## 2017-03-05 DIAGNOSIS — R4701 Aphasia: Secondary | ICD-10-CM | POA: Diagnosis present

## 2017-03-05 DIAGNOSIS — R0789 Other chest pain: Secondary | ICD-10-CM | POA: Diagnosis present

## 2017-03-05 DIAGNOSIS — G459 Transient cerebral ischemic attack, unspecified: Secondary | ICD-10-CM | POA: Diagnosis present

## 2017-03-05 DIAGNOSIS — R200 Anesthesia of skin: Secondary | ICD-10-CM | POA: Diagnosis present

## 2017-03-05 DIAGNOSIS — Z8249 Family history of ischemic heart disease and other diseases of the circulatory system: Secondary | ICD-10-CM

## 2017-03-05 DIAGNOSIS — Z955 Presence of coronary angioplasty implant and graft: Secondary | ICD-10-CM

## 2017-03-05 DIAGNOSIS — I2510 Atherosclerotic heart disease of native coronary artery without angina pectoris: Secondary | ICD-10-CM | POA: Diagnosis present

## 2017-03-05 DIAGNOSIS — Z981 Arthrodesis status: Secondary | ICD-10-CM

## 2017-03-05 DIAGNOSIS — I10 Essential (primary) hypertension: Secondary | ICD-10-CM | POA: Diagnosis present

## 2017-03-05 DIAGNOSIS — Z885 Allergy status to narcotic agent status: Secondary | ICD-10-CM

## 2017-03-05 DIAGNOSIS — Z888 Allergy status to other drugs, medicaments and biological substances status: Secondary | ICD-10-CM

## 2017-03-05 DIAGNOSIS — F40240 Claustrophobia: Secondary | ICD-10-CM | POA: Diagnosis present

## 2017-03-05 DIAGNOSIS — G4733 Obstructive sleep apnea (adult) (pediatric): Secondary | ICD-10-CM | POA: Diagnosis present

## 2017-03-05 DIAGNOSIS — E1142 Type 2 diabetes mellitus with diabetic polyneuropathy: Secondary | ICD-10-CM | POA: Diagnosis present

## 2017-03-05 DIAGNOSIS — I252 Old myocardial infarction: Secondary | ICD-10-CM

## 2017-03-05 DIAGNOSIS — R531 Weakness: Secondary | ICD-10-CM | POA: Diagnosis present

## 2017-03-05 DIAGNOSIS — Z7982 Long term (current) use of aspirin: Secondary | ICD-10-CM

## 2017-03-05 DIAGNOSIS — Z794 Long term (current) use of insulin: Secondary | ICD-10-CM

## 2017-03-05 DIAGNOSIS — Z833 Family history of diabetes mellitus: Secondary | ICD-10-CM

## 2017-03-05 DIAGNOSIS — Z823 Family history of stroke: Secondary | ICD-10-CM

## 2017-03-05 DIAGNOSIS — E785 Hyperlipidemia, unspecified: Secondary | ICD-10-CM | POA: Diagnosis present

## 2017-03-05 DIAGNOSIS — Z881 Allergy status to other antibiotic agents status: Secondary | ICD-10-CM

## 2017-03-05 DIAGNOSIS — R000 Tachycardia, unspecified: Secondary | ICD-10-CM | POA: Diagnosis present

## 2017-03-05 DIAGNOSIS — Z8673 Personal history of transient ischemic attack (TIA), and cerebral infarction without residual deficits: Secondary | ICD-10-CM

## 2017-03-05 DIAGNOSIS — G43909 Migraine, unspecified, not intractable, without status migrainosus: Principal | ICD-10-CM | POA: Diagnosis present

## 2017-03-05 LAB — LIPID PANEL
Cholesterol: 82 mg/dL (ref 75–199)
Coronary Heart Disease Risk: 3.04
HDL: 27 mg/dL — ABNORMAL LOW (ref 40–55)
LDL Calculated: 26 mg/dL
Triglycerides: 146 mg/dL (ref 10–150)
VLDL: 29 (ref 0–40)

## 2017-03-05 LAB — VH DEXTROSE STICK GLUCOSE
Glucose POCT: 144 mg/dL — ABNORMAL HIGH (ref 71–99)
Glucose POCT: 131 mg/dL — ABNORMAL HIGH (ref 71–99)
Glucose POCT: 219 mg/dL — ABNORMAL HIGH (ref 71–99)
Glucose POCT: 212 mg/dL — ABNORMAL HIGH (ref 71–99)
Glucose POCT: 178 mg/dL — ABNORMAL HIGH (ref 71–99)

## 2017-03-05 LAB — HEMOGLOBIN A1C: Hgb A1C, %: 9.5 %

## 2017-03-05 LAB — TROPONIN I
Troponin I: 0 ng/mL (ref 0.00–0.02)
Troponin I: 0 ng/mL (ref 0.00–0.02)

## 2017-03-05 MED ORDER — INFLUENZA VAC SPLIT QUAD 0.5 ML IM SUSY
0.5000 mL | PREFILLED_SYRINGE | INTRAMUSCULAR | Status: DC | PRN
Start: 2017-03-05 — End: 2017-03-08

## 2017-03-05 MED ORDER — INSULIN ASPART 100 UNIT/ML SC SOPN
1.0000 [IU] | PEN_INJECTOR | Freq: Every evening | SUBCUTANEOUS | Status: DC | PRN
Start: 2017-03-05 — End: 2017-03-08
  Administered 2017-03-05: 21:00:00 1 [IU] via SUBCUTANEOUS
  Administered 2017-03-06 (×2): 2 [IU] via SUBCUTANEOUS
  Administered 2017-03-07: 04:00:00 1 [IU] via SUBCUTANEOUS
  Administered 2017-03-07: 22:00:00 2 [IU] via SUBCUTANEOUS
  Administered 2017-03-08: 04:00:00 3 [IU] via SUBCUTANEOUS

## 2017-03-05 MED ORDER — ASPIRIN 81 MG PO TBEC
81.0000 mg | DELAYED_RELEASE_TABLET | Freq: Every morning | ORAL | Status: DC
Start: 2017-03-05 — End: 2017-03-08
  Administered 2017-03-05 – 2017-03-08 (×4): 81 mg via ORAL
  Filled 2017-03-05 (×4): qty 1

## 2017-03-05 MED ORDER — PANTOPRAZOLE SODIUM 40 MG PO TBEC
40.0000 mg | DELAYED_RELEASE_TABLET | Freq: Every morning | ORAL | Status: DC
Start: 2017-03-05 — End: 2017-03-08
  Administered 2017-03-05 – 2017-03-08 (×4): 40 mg via ORAL
  Filled 2017-03-05 (×4): qty 1

## 2017-03-05 MED ORDER — ONDANSETRON 4 MG PO TBDP
4.0000 mg | ORAL_TABLET | Freq: Three times a day (TID) | ORAL | Status: DC | PRN
Start: 2017-03-05 — End: 2017-03-08

## 2017-03-05 MED ORDER — LORAZEPAM 2 MG/ML IJ SOLN
4.0000 mg | Freq: Once | INTRAMUSCULAR | Status: DC | PRN
Start: 2017-03-05 — End: 2017-03-06

## 2017-03-05 MED ORDER — ACETAMINOPHEN 325 MG PO TABS
650.0000 mg | ORAL_TABLET | ORAL | Status: DC | PRN
Start: 2017-03-05 — End: 2017-03-08
  Administered 2017-03-05 – 2017-03-06 (×2): 650 mg via ORAL
  Filled 2017-03-05 (×2): qty 2

## 2017-03-05 MED ORDER — GLUCAGON 1 MG IJ SOLR (WRAP)
1.0000 mg | INTRAMUSCULAR | Status: DC | PRN
Start: 2017-03-05 — End: 2017-03-08

## 2017-03-05 MED ORDER — ONDANSETRON HCL 4 MG/2ML IJ SOLN
4.0000 mg | INTRAMUSCULAR | Status: DC | PRN
Start: 2017-03-05 — End: 2017-03-08

## 2017-03-05 MED ORDER — SODIUM CHLORIDE 0.9 % IV SOLN
INTRAVENOUS | Status: DC
Start: 2017-03-05 — End: 2017-03-05

## 2017-03-05 MED ORDER — NITROGLYCERIN 0.4 MG SL SUBL
0.4000 mg | SUBLINGUAL_TABLET | SUBLINGUAL | Status: DC | PRN
Start: 2017-03-05 — End: 2017-03-08
  Administered 2017-03-07: 11:00:00 0.4 mg via SUBLINGUAL
  Filled 2017-03-05: qty 25

## 2017-03-05 MED ORDER — INSULIN DETEMIR 100 UNIT/ML SC SOPN
60.0000 [IU] | PEN_INJECTOR | Freq: Every evening | SUBCUTANEOUS | Status: DC
Start: 2017-03-05 — End: 2017-03-08
  Administered 2017-03-06 – 2017-03-07 (×3): 60 [IU] via SUBCUTANEOUS
  Filled 2017-03-05 (×3): qty 3

## 2017-03-05 MED ORDER — TRAMADOL HCL 50 MG PO TABS
50.0000 mg | ORAL_TABLET | Freq: Four times a day (QID) | ORAL | Status: DC | PRN
Start: 2017-03-05 — End: 2017-03-08
  Administered 2017-03-05: 50 mg via ORAL
  Filled 2017-03-05: qty 1

## 2017-03-05 MED ORDER — ALBUTEROL-IPRATROPIUM 2.5-0.5 (3) MG/3ML IN SOLN
3.0000 mL | RESPIRATORY_TRACT | Status: DC | PRN
Start: 2017-03-05 — End: 2017-03-08

## 2017-03-05 MED ORDER — DEXTROSE 10 % IV BOLUS
125.0000 mL | INTRAVENOUS | Status: DC | PRN
Start: 2017-03-05 — End: 2017-03-08

## 2017-03-05 MED ORDER — LORAZEPAM 2 MG/ML IJ SOLN
0.5000 mg | Freq: Once | INTRAMUSCULAR | Status: AC
Start: 2017-03-05 — End: 2017-03-05
  Administered 2017-03-05: 18:00:00 0.5 mg via INTRAVENOUS
  Filled 2017-03-05: qty 1

## 2017-03-05 MED ORDER — PREGABALIN 75 MG PO CAPS
100.0000 mg | ORAL_CAPSULE | Freq: Two times a day (BID) | ORAL | Status: DC
Start: 2017-03-05 — End: 2017-03-08
  Administered 2017-03-05 – 2017-03-08 (×7): 100 mg via ORAL
  Filled 2017-03-05 (×15): qty 1

## 2017-03-05 MED ORDER — ACETAMINOPHEN 160 MG/5ML PO SOLN
650.0000 mg | ORAL | Status: DC | PRN
Start: 2017-03-05 — End: 2017-03-08

## 2017-03-05 MED ORDER — ATORVASTATIN CALCIUM 40 MG PO TABS
80.0000 mg | ORAL_TABLET | Freq: Every evening | ORAL | Status: DC
Start: 2017-03-05 — End: 2017-03-08
  Administered 2017-03-05 – 2017-03-08 (×3): 80 mg via ORAL
  Filled 2017-03-05 (×5): qty 2

## 2017-03-05 MED ORDER — HYDRALAZINE HCL 20 MG/ML IJ SOLN
10.0000 mg | INTRAMUSCULAR | Status: DC | PRN
Start: 2017-03-05 — End: 2017-03-08

## 2017-03-05 MED ORDER — SODIUM CHLORIDE 0.9 % IJ SOLN
0.4000 mg | INTRAMUSCULAR | Status: DC | PRN
Start: 2017-03-05 — End: 2017-03-08

## 2017-03-05 MED ORDER — INSULIN ASPART 100 UNIT/ML SC SOPN
2.0000 [IU] | PEN_INJECTOR | Freq: Three times a day (TID) | SUBCUTANEOUS | Status: DC | PRN
Start: 2017-03-05 — End: 2017-03-08
  Administered 2017-03-05 – 2017-03-06 (×3): 3 [IU] via SUBCUTANEOUS
  Administered 2017-03-06: 13:00:00 4 [IU] via SUBCUTANEOUS
  Administered 2017-03-06: 09:00:00 2 [IU] via SUBCUTANEOUS
  Administered 2017-03-07: 12:00:00 3 [IU] via SUBCUTANEOUS
  Administered 2017-03-07: 09:00:00 2 [IU] via SUBCUTANEOUS
  Administered 2017-03-08: 09:00:00 3 [IU] via SUBCUTANEOUS
  Administered 2017-03-08: 13:00:00 4 [IU] via SUBCUTANEOUS
  Filled 2017-03-05: qty 3

## 2017-03-05 MED ORDER — VITAMIN B-12 1000 MCG PO TABS
1000.0000 ug | ORAL_TABLET | Freq: Every day | ORAL | Status: DC
Start: 2017-03-05 — End: 2017-03-08
  Administered 2017-03-05 – 2017-03-08 (×4): 1000 ug via ORAL
  Filled 2017-03-05 (×4): qty 1

## 2017-03-05 MED ORDER — VH HEPARIN SODIUM (PORCINE) 5000 UNIT/ML IJ SOLN
5000.0000 [IU] | Freq: Three times a day (TID) | INTRAMUSCULAR | Status: DC
Start: 2017-03-05 — End: 2017-03-08
  Administered 2017-03-05 – 2017-03-08 (×10): 5000 [IU] via SUBCUTANEOUS
  Filled 2017-03-05 (×14): qty 1

## 2017-03-05 MED ORDER — SODIUM CHLORIDE 0.9 % IJ SOLN
3.0000 mL | Freq: Three times a day (TID) | INTRAMUSCULAR | Status: DC
Start: 2017-03-05 — End: 2017-03-08
  Administered 2017-03-05 – 2017-03-08 (×10): 3 mL via INTRAVENOUS

## 2017-03-05 MED ORDER — LIDOCAINE 5 % EX PTCH
3.0000 | MEDICATED_PATCH | CUTANEOUS | Status: DC
Start: 2017-03-05 — End: 2017-03-08
  Administered 2017-03-05 – 2017-03-08 (×4): 3 via TRANSDERMAL
  Filled 2017-03-05 (×6): qty 3

## 2017-03-05 MED ORDER — ISOSORBIDE MONONITRATE ER 30 MG PO TB24
30.0000 mg | ORAL_TABLET | Freq: Every morning | ORAL | Status: DC
Start: 2017-03-05 — End: 2017-03-08
  Administered 2017-03-05 – 2017-03-08 (×4): 30 mg via ORAL
  Filled 2017-03-05 (×4): qty 1

## 2017-03-05 MED ORDER — PROCHLORPERAZINE EDISYLATE 5 MG/ML IJ SOLN
10.0000 mg | INTRAMUSCULAR | Status: DC | PRN
Start: 2017-03-05 — End: 2017-03-08

## 2017-03-05 MED ORDER — ACETAMINOPHEN 650 MG RE SUPP
650.0000 mg | RECTAL | Status: DC | PRN
Start: 2017-03-05 — End: 2017-03-08

## 2017-03-05 MED ORDER — HYDROXYZINE PAMOATE 50 MG PO CAPS
50.0000 mg | ORAL_CAPSULE | Freq: Every evening | ORAL | Status: DC
Start: 2017-03-05 — End: 2017-03-08
  Administered 2017-03-05 – 2017-03-08 (×3): 50 mg via ORAL
  Filled 2017-03-05 (×5): qty 1

## 2017-03-05 NOTE — Plan of Care (Signed)
Problem: Neurological Deficit  Goal: Neurological status is stable or improving  Outcome: Progressing   03/05/17 2341   Goal/Interventions addressed this shift   Neurological status is stable or improving Monitor/assess/document neurological assessment (Stroke: every 4 hours);Observe for seizure activity and initiate seizure precautions if indicated     Neuro status assessed Q4 hours. No s/s of seizure activity.

## 2017-03-05 NOTE — Progress Notes (Signed)
Pt arrived to room 337. Oriented to room and use of call bell system. Dr. Barnet Pall paged that pt arrived to room. Telemetry hooked up to pt and showing NSR HR 90's. Admission assessment completed and documented at this time. Call bell and bedside tray within reach, bed alarm activated for pt safety. Will continue to monitor.

## 2017-03-05 NOTE — Plan of Care (Signed)
Problem: Moderate/High Fall Risk Score >5  Goal: Patient will remain free of falls  Outcome: Progressing   03/05/17 0418   OTHER   High (Greater than 13) HIGH-Activate bed/chair exit alarm where available;HIGH-Apply yellow "Fall Risk" arm band;HIGH-(VH Only) Place red fall prevention sign on door/chart;HIGH-(VH Only) Place red star on assignment board;HIGH-(VH Only) Yellow slippers;HIGH-(VH Only) Place "Reset Bed Alarm" sign above bed if in use;HIGH-(VH Only) Keep door open for better visability

## 2017-03-05 NOTE — Progress Notes (Signed)
Attempted MRI patient unable to tolerate due to claustrophobia.  Patient states he was "put to sleep" for his previous MRI.

## 2017-03-05 NOTE — Plan of Care (Signed)
Problem: Moderate/High Fall Risk Score >5  Goal: Patient will remain free of falls   03/05/17 0808   OTHER   High (Greater than 13) HIGH-Activate bed/chair exit alarm where available;HIGH-Apply yellow "Fall Risk" arm band;HIGH-Pharmacy to initiate evaluation and intervention per protocol     Bedside report received. Resumed care of patient. Patient resting quietly in bed. Call bell in reach. Will continue to monitor.

## 2017-03-05 NOTE — SLP Eval Note (Signed)
Thank you for allowing [place] to participate in the care of  [name].  Regulations from the Center for Medicare and Medicaid Services (CMS) require your review and approval for this Plan of Care.  Please co-sign this note indicating you are in agreement with the Speech Language Pathology Plan of Care.      [facility]  Speech Language Pathology   Bedside Swallow Evaluation    Patient: Michael [name]     CSN#: [id number]   Referring Physician: Junious Dresser. MD             Date of Referral: 03/05/17    SLP reason:  Consult received for SLP Bedside Swallow Evaluation and Treatment for 61 year old male with possible TIA/CVA. MRI has not been completed at time of SLP evaluation. Per patient, he has a hx of esophageal dilation approx 4-5 years ago and he feels that he may need another one soon as he is having some difficulty swallowing and increased esophageal symptoms.    ASSESSMENT/PLAN/RECOMMENDATIONS:     SLP diagnosis:  Functional oropharyngeal swallow    Diet Recommendations:  Regular diet/Thin liquids   Additional restrictions:  n/a per pt diagnosis/medical hx and discussion with nurse.    Administration of Medications: No restrictions; patient may take meds per their preference/tolerance    Precautions/Compensations:  Feed only when awake/alert, Position patient upright 90 degrees for all po intake/meds, Aspiration precautions, Reflux precautions  Suggestions for Feeding:  Independent  Prognosis:  Good  Aspiration Risk: hx esophageal disease  Modified Barium Swallow Study recommended: no;Not indicated at this time  SLP Follow-Up Treatment needs: D/C; No further SLP needs at this time  Duration of Treatment: N/A  Projected Discharge Recommendations:  No further SLP needs  Other referrals:  Gastroenterology (f/u for esophageal symptoms)    Goals:  Pt will:  STG:  (In 3-4 days)  1. STG=LTG (MET)  LTG: (By discharge)  1. Pt will tolerate the least restrictive diet free of signs/symptoms of aspiration.   (MET)  2. Pt education (MET)      HISTORY OF PRESENT ILLNESS:       Medical Diagnosis: Chest pressure [R07.89]  TIA (transient ischemic attack) [G45.9]    History of Present Illness: Michael [name] is a 61 y.o. male admitted on 03/05/2017 with   Patient Active Problem List   Diagnosis   . CAD (coronary artery disease)   . Chest pain   . Glucosuria   . BPH (benign prostatic hyperplasia)   . Balanitis   . Cellulitis of finger   . Type 2 diabetes mellitus   . Hypertension   . Cellulitis of middle finger, left   . TIA (transient ischemic attack)        Past Medical/Surgical History:  Past Medical History:   Diagnosis Date   . CAD (coronary artery disease)    . Cellulitis of finger of left hand 10/22/14   . Diabetes mellitus    . Gastroesophageal reflux disease    . Hernia    . Hyperlipidemia    . Hypertension    . Myocardial infarction    . Neuropathy     diabetic   . Presence of stent in coronary artery     4 STENTS   . TIA (transient ischemic attack)       Past Surgical History:   Procedure Laterality Date   . AMPUTATION, FINGER Left 02/26/2015    Procedure: AMPUTATION, FINGER;  Surgeon: Lujean Rave, MD;  Location: [facility];  Service: Orthopedics;  Laterality: Left;  Amputation left long finger   . CERVICAL FUSION     . CIRCUMCISION, > 13 YEARS, ADULT N/A 09/08/2014    Procedure: CIRCUMCISION, > 13 YEARS, ADULT;  Surgeon: Ellwood Handler, MD;  Location: [facility];  Service: Urology;  Laterality: N/A;  CIRCUMCISION   . CORONARY ANGIOPLASTY WITH STENT PLACEMENT      2 stents   . CORONARY ANGIOPLASTY WITH STENT PLACEMENT  12/13/12    DES to OM1, 2 stents   . LUMBAR LAMINECTOMY           Baseline cognitive-communication/swallowing status:  no hx of dysphagia    SUBJECTIVE:      Subjective: Patient is agreeable to participation in the therapy session. Nursing clears patient for therapy. Patient's medical condition is appropriate for Speech therapy intervention at this time.    S:  "Sometimes it feels like things get stuck right here." Pointing to the top of this throat.      OBJECTIVE:     Observation of Patient/Vital Signs:    Positioning: Patient is upright in bed.  Respiratory status: Room air  Source of nutrition at time of eval:  NPO without access  Other medical equipment in place: IV, telemetry   O:    Oral Motor/Swallow Skills:    Dentition: WFL for mastication  Strength:  WFL  Coordination:  WFL  ROM: WFL  Symmetry:  WFL   Oral Apraxia:  N/A  Sensation:  WFL  Gag Reflex:  Not assessed  Laryngeal Elev:  WFL, Timely initiation of the swallow reflex, +3 of 4 fingers width laryngeal elevation palpated  Cough/Grunt:  WFL  Motor Speech/Voice:  WFL    Consistencies presented:  Ice Chips:   tsp x1; 1tsp x1  No signs/symptoms of aspiration, no change in respiratory status, no vocal change, timely initiation of the swallow reflex  Pureed:   tsp x1; 1tsp x1  No signs/symptoms of aspiration, no change in respiratory status, no vocal change, timely initiation of the swallow reflex  Dysph Mech:   tsp x1; 1tsp x1  No signs/symptoms of aspiration, no change in respiratory status, no vocal change, timely initiation of the swallow reflex  Masticated:   graham cracker  No signs/symptoms of aspiration, no change in respiratory status, no vocal change, timely initiation of the swallow reflex  Thin Liquid:  Straw x6  No signs/symptoms of aspiration, no change in respiratory status, no vocal change, timely initiation of the swallow reflex      Pt passed 3 oz water screening. Some delayed throat clearing observed at the end of the session, indicating possible esophageal symptoms. Recommended to f/u with GI.     Pt/Family/Caregiver Education Provided:     Patient was/were educated re: results/recommendations of today's evaluation  Good understanding was verbalized/demonstrated: yes  Comprehension limited by: None    Team Communication:     Spoke to : RN/LPN - Medical laboratory scientific officer  Regarding: results/recommendations  of today's evaluation  Good understanding was verbalized: yes    G-CODES:     Swallowing G Code Set  Swallowing, Current Status [id number]): At least 1 percent but less than 20 percent impaired, limited or restricted  Swallowing, Goal Status (J1914): At least 1 percent but less than 20 percent impaired, limited or restricted  Swallowing, D/C Status (N8295): At least 1 percent but less than 20 percent impaired, limited or restricted  Tools used to determine level of impairment:: Clinical judgement  Time of Treatment:   SLP Received On: 03/05/17  Start Time: 0928  Stop Time: 0942  Time Calculation (min): 14 min    SLP Visit Number: 1      Evaluation was completed by:  Roland Earl, M.S., CCC-SLP  Speech Language Pathologist  [facility]

## 2017-03-05 NOTE — H&P (Signed)
[facility]                                                                                                                                                                         HISTORY AND PHYSICAL      Primary Care Providers:  PCP: Shon Hough, MD  Cardiologist: Orest Dikes, MD    Chief Complaint:  Right facial numbness, sided weakness  Chest pressure    HPI:  Michael [name] is a 61 y.o. Caucasian male with medical history of coronary artery disease status post angioplasty and stenting with 4 stents, left heart catheterization in December 2014 showing patent stents, nuclear stress test in August 2018 with official results of abnormal but low-risk nuclear stress test, non-insulin-dependent diabetes mellitus type 2 with peripheral neuropathy, essential hypertension, dyslipidemia, transferred to [facility] from [facility] for further evaluation and management of the above chief complaint  Patient symptoms began at 1700 hrs. on Saturday, March 04, 2017 when he woke up from sleep with left facial numbness and upper and lower extremity weakness and numbness, aphasia. He also had episodic chest pressure. Currently, all symptoms have resolved exception of the left facial numbness.  Noncontrast CT at an outlying facility reported as negative  Initial troponin was 0.01  12-lead EKG without acute ischemic findings  Chest x-ray without acute parenchymal abnormalities  Transferred to [facility] for further evaluation  Seen at bedside on arrival at this facility  Hemodynamically stable  Admission orders placed      Past Medical History:   Past Medical History:   Diagnosis Date   . CAD (coronary artery disease)    . Cellulitis of finger of left hand 10/22/14   . Diabetes mellitus    . Gastroesophageal  reflux disease    . Hernia    . Hyperlipidemia    . Hypertension    . Myocardial infarction    . Neuropathy     diabetic   . Presence of stent in coronary artery     4 STENTS   . TIA (transient ischemic attack)        Past Surgical History:  Past Surgical History:   Procedure Laterality Date   . 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 CERVICAL FUSION     . CIRCUMCISION, > 13 YEARS, ADULT N/A 09/08/2014    Procedure: CIRCUMCISION, > 13 YEARS, ADULT;  Surgeon: Ellwood Handler, MD;  Location: [facility];  Service: Urology;  Laterality: N/A;  CIRCUMCISION   . CORONARY ANGIOPLASTY WITH STENT  PLACEMENT      2 stents   . CORONARY ANGIOPLASTY WITH STENT PLACEMENT  12/13/12    DES to OM1, 2 stents   . LUMBAR LAMINECTOMY         Allergies:  Allergies   Allergen Reactions   . Gabapentin      "Makes crazy/hyper"   . Glucophage [Metformin Hydrochloride] Nausea And Vomiting   . Keflex [Cephalexin] Nausea And Vomiting   . Morphine      Hallucinations   . Victoza [Liraglutide] Nausea And Vomiting       Outpatient Medications:  Current Facility-Administered Medications   Medication Dose Route Frequency Provider Last Rate Last Dose   . influenza quadrivalent-split vaccine (PF) (FLUARIX/FLULAVAL) IM injection 0.5 mL  0.5 mL Intramuscular Prior to discharge Ester Rink, MD           Social History:  Social History   Substance Use Topics   . Smoking status: Never Smoker   . Smokeless tobacco: Never Used   . Alcohol use Yes      Comment: once a year maybe       Illicit Drug Use:   Reviewed and negative    Designated Health Care Proxy:   Marquis Down, brother    Family History:  Family history was obtained and was non contributory for cardiac history, cancer history.     Review of Systems:  All systems reviewed including eyes, ENT,  cardiovascular, respiratory, gastrointestinal, genitourinary, psychiatric, neurologic, integumentary, allergic/hematology,  are reviewed and found unremarkable except pertinent positives mentioned in the history of present illness and past medical history.     Physical Exam:    Vital Signs:  BP (!) 139/91   Pulse 98   Temp 97.9 F (36.6 C) (Oral)   Resp 18   Ht 1.727 m (5\' 8" )   Wt 90.4 kg (199 lb 4.7 oz)   SpO2 96%   BMI 30.30 kg/m   No intake or output data in the 24 hours ending 03/05/17 0444  Wt Readings from Last 4 Encounters:   03/05/17 90.4 kg (199 lb 4.7 oz)   11/29/16 87.1 kg (192 lb)   11/18/16 89 kg (196 lb 3.4 oz)   04/27/16 88.5 kg (195 lb)         1) General Appearance:  Middle-aged Caucasian male, in no apparent acute      cardiorespiratory nor painful distress.     2) HEENT: Head is atraumatic and normocephalic.       Eyes: Pink conjunctiva, anicteric sclera. Pupils are equally reactive to light and      accommodation. Extraocular muscles are intact.      ENT:  Patient has intact external auditory canal. No abnormal lesions or bleeding from      nose. Oral mucosa moist with no pharyngeal congestion, erythema or swelling.    3) Neck: Supple, with full range of motion without any discomfort. Trachea is central,       no JVD noted, no carotid bruit, no cervical masses palpable.    4) Chest: Clear to auscultation bilaterally, no adventitial sounds heard.    5) CVS:  S1, S2 normal intensity and regular rhythm. No murmurs, rubs or gallops appreciated.       No tenderness to palpation of chest wall    6) Abdomen:  Soft, non tender, non distended, no palpable mass. Bowel sounds audible.      No costovertebral angle tenderness noted.    7) Extremities: 2+ pulses without  edema, cyanosis or clubbing.    8) Musculoskeletal: No joint deformities, tenderness or swelling noted. Full range of      motion in all the joints examined.    9) Skin: Warm with normal skin turgor, no lesions seen.    10)  Lymphatics: No lymphadenopathy in axillary, cervical and inguinal area.     11) Neurological: Alert and oriented x 4. Subjective left-sided facial numbness    12) Psychiatric:  Mood and affect are appropriate.     Labs: Reviewed by me    Labs Reviewed   VH DEXTROSE STICK GLUCOSE - Abnormal; Notable for the following:        Result Value    Glucose, POCT 144 (*)     All other components within normal limits       EKG: Reviewed by me  NSR @ 96  No acute ischemic findings    Imaging Studies: Reviewed by me    Noncontrast CT head: No acute intracranial process  CXR: No Acute parenchymal process    Assessment and Plan:    1. TIA versus CVA  MRI/MRA studies per stroke protocol  Nothing by mouth   PT/OT/SLP evaluation  2-D echo with bubble study  Fall precautions with neuro checks  Resume outpatient aspirin and Lipitor  Neuro telemetry monitoring    2. Chest pressure in the context of coronary artery disease status post angioplasty and stenting  Currently chest pain/pressure free  Serial troponin and 12-lead EKG; initial workup negative for acute ischemic findings  Resume outpatient aspirin, Lipitor and long acting nitrate  Check lipid panel  Oxygen when necessary  Further management will depend on clinical progression and results of workup ordered    3. Essential hypertension  Controlled on admission  Allowing permissive hypertension for peripheral blood flow  Treatment parameters set with medications ordered    4. 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 Dyslipidemia  Resume outpatient Lipitor   Check lipid panel     Plan of care discussed with patient and is in agreement as outlined, with verbalization of understanding and agreement    Disposition: Observation admission    GI Prophylaxis:  Protonix    DVT Prophylaxis: Heparin    CODE Status : FULL CODE    Ester Rink, MD  03/05/2017    4:44 AM      Note: This chart was generated by the Epic EMR system/speech recognition and may contain inherent errors or omissions not intended by the user. Grammatical errors, random word insertions, deletions, pronoun errors and incomplete sentences are occasional consequences of this technology due to software limitations. Not all errors are caught or corrected. If there are questions or concerns about the content of this note or information contained within the body of this dictation they should be addressed directly with the author for clarification

## 2017-03-05 NOTE — Progress Notes (Signed)
Patient seen and examined    Patient thinks left-sided weakness is improving. He also admits that he has a baseline left-sided weakness as well as numbness from neuropathy.    Pending workup.    Clabe Seal MD  Internal Medicine Hospitalist

## 2017-03-06 DIAGNOSIS — G4733 Obstructive sleep apnea (adult) (pediatric): Secondary | ICD-10-CM

## 2017-03-06 DIAGNOSIS — I6789 Other cerebrovascular disease: Secondary | ICD-10-CM

## 2017-03-06 DIAGNOSIS — E785 Hyperlipidemia, unspecified: Secondary | ICD-10-CM

## 2017-03-06 DIAGNOSIS — R202 Paresthesia of skin: Secondary | ICD-10-CM

## 2017-03-06 DIAGNOSIS — E119 Type 2 diabetes mellitus without complications: Secondary | ICD-10-CM

## 2017-03-06 DIAGNOSIS — I2510 Atherosclerotic heart disease of native coronary artery without angina pectoris: Secondary | ICD-10-CM

## 2017-03-06 LAB — CBC AND DIFFERENTIAL
Basophils %: 0.5 % (ref 0.0–3.0)
Basophils Absolute: 0 10*3/uL (ref 0.0–0.3)
Eosinophils %: 4.1 % (ref 0.0–7.0)
Eosinophils Absolute: 0.2 10*3/uL (ref 0.0–0.8)
Hematocrit: 42.1 % (ref 39.0–52.5)
Hemoglobin: 14.2 gm/dL (ref 13.0–17.5)
Lymphocytes Absolute: 1.7 10*3/uL (ref 0.6–5.1)
Lymphocytes: 33.9 % (ref 15.0–46.0)
MCH: 29 pg (ref 28–35)
MCHC: 34 gm/dL (ref 32–36)
MCV: 86 fL (ref 80–100)
MPV: 7.1 fL (ref 6.0–10.0)
Monocytes Absolute: 0.4 10*3/uL (ref 0.1–1.7)
Monocytes: 8 % (ref 3.0–15.0)
Neutrophils %: 53.6 % (ref 42.0–78.0)
Neutrophils Absolute: 2.6 10*3/uL (ref 1.7–8.6)
PLT CT: 160 10*3/uL (ref 130–440)
RBC: 4.89 10*6/uL (ref 4.00–5.70)
RDW: 13.5 % (ref 11.0–14.0)
WBC: 4.9 10*3/uL (ref 4.0–11.0)

## 2017-03-06 LAB — VH DEXTROSE STICK GLUCOSE
Glucose POCT: 230 mg/dL — ABNORMAL HIGH (ref 71–99)
Glucose POCT: 161 mg/dL — ABNORMAL HIGH (ref 71–99)
Glucose POCT: 252 mg/dL — ABNORMAL HIGH (ref 71–99)
Glucose POCT: 244 mg/dL — ABNORMAL HIGH (ref 71–99)
Glucose POCT: 221 mg/dL — ABNORMAL HIGH (ref 71–99)

## 2017-03-06 MED ORDER — LORAZEPAM 2 MG/ML IJ SOLN
4.0000 mg | INTRAMUSCULAR | Status: AC
Start: 2017-03-06 — End: 2017-03-06

## 2017-03-06 NOTE — Progress Notes (Signed)
[facility]  PROGRESS NOTE      Patient: Michael [name]  Date: 03/06/2017   LOS: 0 Days  Admission Date: 03/05/2017   MRN: [id number]  Attending: Clabe Seal, MD       INTERIM SUMMARY     Admitted with left  facial numbness, left sided weakness and numbness and Chest pressure.  Patient says he has a baseline left-sided weakness and numbness he just thought it got worse and initially and now he is saying it's feeling better to baseline. Still has left sided facial tingling he says he didn't have this before. Noncontrast CT at an outlying facility reported as negative Patient's MRI studies could not be done at first attempt because of his being claustrophobic. More Ativan ordered to have the studies done.      SUBJECTIVE     Michael [name] states Feeling better with left-sided weakness still feels some numbness on the left side of the face.  No chest pain reported not short of breath    NAD otherwise      ASSESSMENT/PLAN     Michael [name] is a 61 y.o. male            1. TIA versus CVA  MRI/MRA studies per stroke protocol--IV Ativan ordered because of claustrophobia  Nothing by mouth   PT/OT  SLP evaluation--did ok  2-D echo with bubble study--pending  Fall precautions with neuro checks  Resume outpatient aspirin and Lipitor  Neuro telemetry monitoring    2. Chest pressure in the context of coronary artery disease status post angioplasty and stenting  --consult cards.. troponins negative    3. Essential hypertension  Controlled on admission  Allowing permissive hypertension for peripheral blood flow  Treatment parameters set with medications ordered    4. 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 Dyslipidemia  Resume outpatient Lipitor   Check lipid panel       DVT  Prophylaxis: Heparin subq    Disposition: As per pending workup    Please contact me via cortext for routine matters and via phone [phone] for urgent matters.         Code Status: Full Code             MEDICATIONS     Current Facility-Administered Medications   Medication Dose Route Frequency   . aspirin EC  81 mg Oral QAM   . atorvastatin  80 mg Oral QHS   . heparin (porcine)  5,000 Units Subcutaneous Q8H   . hydrOXYzine  50 mg Oral QHS   . insulin detemir  60 Units Subcutaneous QHS   . isosorbide mononitrate  30 mg Oral QAM   . lidocaine  3 patch Transdermal Q24H   . LORazepam  4 mg Intravenous On Call   . pantoprazole  40 mg Oral QAM AC   . pregabalin  100 mg Oral BID   . sodium chloride (PF)  3 mL Intravenous Q8H   . vitamin B-12  1,000 mcg Oral Daily       PHYSICAL EXAM     Vitals:    03/06/17 1210   BP: 123/68   Pulse: (!) 101   Resp: 18   Temp: 98.2 F (36.8 C)   SpO2: 96%  Temperature: Temp  Min: 96.3 F (35.7 C)  Max: 98.4 F (36.9 C)  Pulse: Pulse  Min: 82  Max: 121  Respiratory: Resp  Min: 16  Max: 18  Non-Invasive BP: BP  Min: 123/68  Max: 146/91  Pulse Oximetry SpO2  Min: 94 %  Max: 98 %      Constitutional: Seems to be NAD   HEENT: NC/AT, PERRL,EOMI. Neck supple  Cardiovascular: RRR, normal S1 S2, no murmurs, gallops.  Respiratory:  CTA bilaterally Normal rate. No retractions or increased work of breathing.  Gastrointestinal: Normal BS, non-distended, soft, non-tender, no rebound or guarding.  Genitourinary: no suprapubic or costovertebral angle tenderness  Musculoskeletal: ROM and motor strength grossly normal. No clubbing, edema.   Skin: no rashes, jaundice , cyanosis  Neurologic: AAO x3.  CN 2-12 grossly intact except some numbness left face. Left upper and lower extremity very mild weakness if any and some numbness. No gross motor or sensory deficits  Psychiatric: Affect and mood appropriate. Cooperative.     LABS       Recent Labs  Lab 03/06/17  0932   WBC 4.9   RBC 4.89   Hemoglobin 14.2    Hematocrit 42.1   MCV 86   PLT CT 160         Recent Labs  Lab 03/05/17  0733 03/05/17  0505   Troponin I 0.00 0.00             Microbiology Results     None           RADIOLOGY     No results found.      Eliott Nine, MD  3:03 PM 03/06/2017

## 2017-03-06 NOTE — Plan of Care (Signed)
Problem: Moderate/High Fall Risk Score >5  Goal: Patient will remain free of falls   03/05/17 0808   OTHER   High (Greater than 13) HIGH-Activate bed/chair exit alarm where available;HIGH-Apply yellow "Fall Risk" arm band;HIGH-Pharmacy to initiate evaluation and intervention per protocol

## 2017-03-06 NOTE — PT Eval Note (Addendum)
VHS: [facility]  Department of Rehabilitation Services: [phone]  Darelle Kings    CSN: [id number]    SURG TELEMETRY STEP-DOWN   337/337-A    Physical Therapy Evaluation    Time of treatment:  Time Calculation  PT Received On: 03/06/17  Start Time: 0940  Stop Time: 1005  Time Calculation (min): 25 min    Visit#: 1                                                                                 Precautions and Contraindications:   Falls  Mobility protocol     Clinical Presentation and Decision Making     PT Assessment:  Michael [name] was admitted 03/05/2017 with left sided facial numbness with possible TIA vs CVA.    At baseline, patient reports left upper and lower extremity impairments from peripheral neuropathy.  In the left lower extremity, he demonstrates weakness in the L4-S1 patterns with reports of multiple back surgeries that have affected pain and function in the left lower extremity.  He is currently being followed by a neurologist.      Patient was able to perform dynamic balance activities and ambulate with independence, although his gait pattern is altered (see gait assessment) from left lower extremity weakness that is his baseline.  Patient has had 3 falls recently, therefore PT will follow-up to further assess fall risk and possibly transition patient to using a single point cane for safety.  Recommending patient have assist with heavy chores (laundry, shopping) and home health PT at discharge.       Patient presenting with the following PT Impairments:decreased strength, decreased activity tolerance, decreased functional mobility, decreased balance, gait deficits, pain    Patient will benefit from skilled PT services in order to maximize functional independence.       Due to the presence of several treatment options and 1-2 comorbidities or personal factors that affect performance, as well as patient's stable and/or uncomplicated characteristics, minimal to  moderate modifications of mobility and/or assistance were necessary to complete evaluation when examining total of 3 elements (includes body structures and functions, activity limitations and/or participation restriction) determines the degree of complexity for this patient is LOW    Rehabilitation Potential:Good with ongoing PT upon discharge from acute care.     Discussed risk, benefits and Plan of Care with: Patient    DISCHARGE RECOMMENDATIONS   DME recommended for Discharge:   None at this time    Discharge Recommendations:   Home with supervision   Pending verification of the following home support and resources:  -Off-site supervision = patient has medical alert and/or caregiver calls daily to check on patient; caregiver available to assist if needed. * Assist with the following IADLs:   Laundry, [employer], Armed forces logistics/support/administrative officer of Plan of Care:     Goals:    1.  Patient will score below fall risk cut off score on the TUG with or without assistive device. NEW   2.  Patient will score >45/56 on the Berg balance scale to assess with fall risk. NEW    Treatment/interventions: Exercise, Gait training, Functional transfer training, LE  strengthening/ROM, Cognitive reorientation, Equipment eval/education, Bed mobility, Compensatory technique education    Treatment Frequency: follow-up visit only    History:   History of Present Illness:    Medical Diagnosis: Chest pressure [R07.89]  TIA (transient ischemic attack) [G45.9]    Michael [name] is a 61 y.o. male admitted on 03/05/2017 with above chief complaints.     Patient Active Problem List   Diagnosis   . CAD (coronary artery disease)   . Chest pain   . Glucosuria   . BPH (benign prostatic hyperplasia)   . Balanitis   . Cellulitis of finger   . Type 2 diabetes mellitus   . Hypertension   . Cellulitis of middle finger, left   . TIA (transient ischemic attack)        X-Rays/Tests/Labs:  No results found.      Past Medical/Surgical History:  Past  Medical History:   Diagnosis Date   . CAD (coronary artery disease)    . Cellulitis of finger of left hand 10/22/14   . Diabetes mellitus    . Gastroesophageal reflux disease    . Hernia    . Hyperlipidemia    . Hypertension    . Myocardial infarction    . Neuropathy     diabetic   . Presence of stent in coronary artery     4 STENTS   . TIA (transient ischemic attack)       Past Surgical History:   Procedure Laterality Date   . 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 CERVICAL FUSION     . CIRCUMCISION, > 13 YEARS, ADULT N/A 09/08/2014    Procedure: CIRCUMCISION, > 13 YEARS, ADULT;  Surgeon: Ellwood Handler, MD;  Location: [facility];  Service: Urology;  Laterality: N/A;  CIRCUMCISION   . CORONARY ANGIOPLASTY WITH STENT PLACEMENT      2 stents   . CORONARY ANGIOPLASTY WITH STENT PLACEMENT  12/13/12    DES to OM1, 2 stents   . LUMBAR LAMINECTOMY           Social History:    Home Living Arrangements:  Living Arrangements: Alone  Assistance Available: None  Type of Home: Apartment  Home Layout: One level, with no stairs to enter    Prior Level of Function:  Community ambulation  Mobility:  Independent with  No assistive device  Fall history: 3 falls in the past 6 months    DME available at home:  Walking stick     Subjective   "I feel close to my baseline, I was supposed to get a brace for my ankle, but I haven't yet"   Patient is agreeable to participation in the therapy session. Nursing clears patient for therapy.    Patient/caregiver goal for PT: not stated    Pain:  At Rest: 0/10  With Activity: 0/10  Location: N/A  Interventions: None required    Examination of Body Systems (Structures, Function, Activity and Participation)   Patient's medical condition is appropriate for Physical therapy intervention at this time    Observation  of patient:  Patient is in bed with telemetry     Cognition:  Oriented to: Oriented x4    Vital Signs (Cardiovascular):  BP Supine:  132/76 mmHg  HR 114  97% O2     Sensation: diminished, to  light touch, below the knees     Balance:  Static Sitting:  Good  Dynamic Sitting:  Good  Static Standing:  Good  Dynamic Standing:  Good        Able to ambulate with horizontal head turns and no loss of balance.  No loss of balance with turns 180*-360* and picking up an object from the floor.     Musculoskeletal Examination:            Range of motion:  Right LE: Grossly WFL  Left LE: Grossly WFL       Strength:  Right Hip Flexion:  5/5  Right Knee Flexion:  5/5  Right Knee Extension: 5/5  Right Ankle Dorsiflexion:  5/5  Right Ankle Plantar Flexion:  5/5  Left Hip Flexion:  5/5  Left Knee Flexion:  4/5  Left Knee Extension:  5/5  Left Ankle Dorsiflexion:  3+/5  Left Ankle Plantar Flexion:  3+/5  Great toe extension left: 2/5    Tone:  Normal     Functional Mobility:    Bed Mobility:  Supine to Sit:   Independent.        Sit to Supine:   Independent.          Transfers:  Sit to Stand:  Independent with No assistive device.         Stand to Sit:  Independent.           Locomotion:  LEVEL AMBULATION:  Distance: x200 feet   Assistance level:  Initially supervision but progressed to indepednent.  Attempted using hand held assist to assess for need of single point cane but patient was not pushing through PT's hand at all.  Device: gait belt  Pattern:  Left foot inverting at heel strike, minimal foot drop noted.      *Patient reports he has been seeing a neurologist recently about this issue and is being worked up for a brace.     AM-PACT "6 Clicks" Basic Mobility Inpatient Short Form  Turning Over in Bed: None  Sitting Down On/Standing From Armchair: None  Lying on Back to Sitting on Side of Bed: None  Assist Moving to/from Bed to Chair: None  Assist to Walk in Hospital Room: None  Assist to Climb 3-5 Steps with Railing: A little  PT  Basic Mobility Raw Score: 23  CMS 0-100% Score: 11.20% Mobility G Code Set  Mobility, Current Status (Z6109): At least 1 percent but less than 20 percent impaired, limited or restricted  Mobility, Goal Status (U0454): 0 percent impaired, limited or restricted               Participation and Activity Tolerance:  Participation effort: Good  Activity Tolerance: Tolerates 10-20 minutes of activity with multiple rests    Treatment Interventions this session:   Evaluation    Education Provided:   TOPICS: role of physical therapy, plan of care, goals of therapy and HEP, safety with mobility and ADLs, benefits of activity, home safety, use of adaptive equipment, bed mobility with use of adaptive equipment or strategy, activity with nursing     Learner educated: Patient  Method: Explanation  Response to education: Verbalized understanding    Patient Position at End of Treatment:   Sitting, in a chair, Needs in reach, Bed/chair alarm set and No distress    Team Communication:     Spoke to: RN/LPN - Cari  Regarding: Patient participation with Therapy  Whiteboard updated: No  PT/PTA communication: via written  note and verbal communication as needed.      Recommend patient ambulate 3x daily with assist and sit in chair for meals outside of PT sessions.    Sydell Axon, PT, DPT

## 2017-03-06 NOTE — Progress Notes (Signed)
Patient sitting on EOB, denies pain at this time. Awaiting MRI-spoke w/ MRI tech late afternoon-they will be calling for patient this evening and aware of pt's med that needs to be given prior to testing. VSS. Pt is SR-ST on tele 90-100s. Bedside report given to oncoming nurse. CBIR.

## 2017-03-06 NOTE — OT Eval Note (Signed)
Thank you for allowing [place] to participate in the care of  Michael [name].  Regulations from the [employer] for Medicare and Medicaid Services (CMS) require your review and approval for this Plan of Care.  Please co-sign this note indicating you are in agreement with the Occupational Therapy Plan of Care.    VHS: [facility]  Department of Rehabilitation Services: [phone]    Sylvan Sookdeo    CSN#: [id number]  SURG TELEMETRY STEP-DOWN 337/337-A    Occupational Therapy Evaluation    Consult received for Michael [name] for OT Evaluation and Treatment.  Patient's medical condition is appropriate for Occupational therapy intervention at this time.    Time of treatment:   Time Calculation  OT Received On: 03/06/17  Start Time: 5573  Stop Time: 0956  Time Calculation (min): 13 min    Visit#: 01    Precautions and Contraindications:   Falls    OT Assessment/Clinical Decision Making:      Michael [name] is a 61 y.o. male admitted 03/05/2017 presenting with TIA, with acute left side weakness facial weakness and chest pressure, symptoms have nearly resolved; pt also with h/o left side weakness .     Resulting in impairments with balance deficits and fall risk. These impairments are affecting the patient's ability to perform in needed/desired occupations resulting in performance deficits in bathing/showering, functional mobility/transfers and reported IADLs. P.T. To address LE weakness. Patient would benefit from home hlth occupational therapy services for above noted impairments related to home safety with left LE weakness & fall prevention- pending d/c date; Patient very near baseline with left UE strengthening & self care.     Rehabilitation Potential: NA       Plan:   Treatment/interventions: patient/family training, no skilled interventions needed at this time    Treatment Frequency: OT Frequency Recommended: one time visit    Goals: NA      DISCHARGE RECOMMENDATIONS   DME recommended  for Discharge:   Has needed equipment  Shower chair    Discharge Recommendations:   Home with home health OT         Medical & Therapy History:   Medical Diagnosis: Chest pressure [R07.89]  TIA (transient ischemic attack) [G45.9]    Michael [name] is a 61 y.o. male admitted on 03/05/2017 with TIA, with acute left side weakness facial weakness and chest pressure, symptoms have nearly resolved; pt also with h/o left side weakness.    Discussed with patient/family/caregiver the patient's physical, cognitive and/or psychosocial history related to current functional performance: yes      Patient Active Problem List   Diagnosis   . CAD (coronary artery disease)   . Chest pain   . Glucosuria   . BPH (benign prostatic hyperplasia)   . Balanitis   . Cellulitis of finger   . Type 2 diabetes mellitus   . Hypertension   . Cellulitis of middle finger, left   . TIA (transient ischemic attack)        Past Medical/Surgical History:  Past Medical History:   Diagnosis Date   . CAD (coronary artery disease)    . Cellulitis of finger of left hand 10/22/14   . Diabetes mellitus    . Gastroesophageal reflux disease    . Hernia    . Hyperlipidemia    . Hypertension    . Myocardial infarction    . Neuropathy     diabetic   . Presence of stent in coronary  artery     4 STENTS   . TIA (transient ischemic attack)       Past Surgical History:   Procedure Laterality Date   . 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 CERVICAL FUSION     . CIRCUMCISION, > 13 YEARS, ADULT N/A 09/08/2014    Procedure: CIRCUMCISION, > 13 YEARS, ADULT;  Surgeon: Ellwood Handler, MD;  Location: [facility];  Service: Urology;  Laterality: N/A;  CIRCUMCISION   . CORONARY ANGIOPLASTY WITH STENT PLACEMENT      2 stents   . CORONARY ANGIOPLASTY WITH STENT PLACEMENT  12/13/12    DES to  OM1, 2 stents   . LUMBAR LAMINECTOMY           Occupational Profile:   Home Living Arrangements  Living Arrangements: Alone  Assistance Available: None  Type of Home: Apartment, ground floor; for 3 month  Home Layout: One level; no STE building  Bathroom:  toilet riser;  tub/shower unit, grab bars in shower, shower chair  DME Currently at Home: walking stick    Prior Level of Function  Mobility: Community ambulation, Household ambulation, laundry in building; right handed; (+) glasses  Fall History: three with turning to the left in the past six months     Activities of Daily Living  Patient was independent with all ADLs    Instrumental Activities of Daily Living  Driving & community mobility: Driving: Independent  Patient was independent with all IADLs.      Patient/caregiver goal for OT: return home     Subjective:   "yes"   Re' home hlth OT pending d/c date for IADLs , home safety related to left LE weakness.  Patient is agreeable to participation in the therapy session. Nursing clears patient for therapy.    Pain:  At Rest: 5 /10  With Activity: 5/10  Location: HA, left side of face numbness, and bil UE and feet numbness  Interventions: Patient declines intervention, RN/LPN aware    ASSESSMENT OF OCCUPATIONAL PERFORMANCE:   Observation of Patient:    Patient is in bed            Vital Signs:   Stable with no signs/symptoms of distress  132/76, HR= 114; o2= 97% RA     Command following: Follows 1 step commands with increased time, 100% of the time  Safety Awareness: Independent  Problem Solving: supervision  Behavior: Cooperative    Musculoskeletal Examination:   Range of motion:  Bilateral UE: Grossly WFL; L LF amp at PIP       Strength:  Bilateral UE: Grossly WFL; weak grips    issued therablock for intrinsic (-) , grip and pinch strengthening; demonstrated good understanding    Sensory/Oculomotor Examination:   Auditory:  WFL=intact  Visual Acuity:  grossly WFL; reports recent Left cataract sx, and December  for second cataract sx.      Activities of Daily Living:   Eating:  Independent;   Grooming: Modified Independent with increased time, seated in a chair  UB dressing:  Modified Independent with increased time, seated at edge of bed  LB dressing:  Modified Independence with increased time and effort, don/doff R sock, don/doff L sock, seated at edge of bed  Bathing:  Modified Independent with increased time, Supervision;, Simulated, seated at edge of bed, seated  in a chair  Toileting:  Modified Independent with increased time    AM-PACT "6 Clicks" Daily Activity Inpatient Short Form  Inpatient AM-PACT Performed?: yes  Put On/Take Off Lower Body Clothing: A little  Assist with Bathing: A little  Assist with Toileting: None  Put On/Take Off Upper Body Clothing: None  Assist with Grooming: None  Assist with Eating: None  OT Daily Activity Raw Score: 22  CMS 0-100% Score: 25.80%    Functional Mobility:   Bed Mobility:  Supine to Sit:   Modified independence .        Sit to Supine:   Modified independence .          Transfers:  Sit to Stand:  Modified independence  with No assistive device, Hand held assist.        Stand to Sit:  Modified independence .        Toilet: Modified independence  with Grab Bar left.        Shower stall: Modified independence  with  grab bars.        Tub shower: Modified independence  Supervision with  grab bars.   education regarding BR safety and fall prevention    Balance:   Sitting:  Good  Standing:  Good - Fair+; weak left LE/ foot- pt aware to use grab bar or wall for support    Participation and Activity Tolerance   Participation effort: Good  Activity Tolerance: Tolerates 30 minutes of activity without rest breaks          Education Provided:   Topics: Role of occupational therapy, plan of care, goals of therapy and safety with mobility and ADLs, benefits of activity, energy conservation techniques, home safety.    Individuals educated: Patient.  Method: Explanation and  Demonstration.  Response to education: Verbalized understanding and Demonstrated understanding.    Team Communication:   OT communicated with: RN/LPN - Cari , PT Michael [name]  OT communicated regarding: Pre-session re: patient status, Patient position at end of session, Discharge needs, Patient participation with Therapy  OT/COTA communication: via written note and verbal communication as needed.    Sitting, in a chair, in the room, Needs in reach, Bed/chair alarm set and No distress    Recommend client -participate in self care and OOB with asst-  outside of and in addition to OT session.    Cheskel Silverio Rolland Bimler, OTR/L

## 2017-03-06 NOTE — UM Notes (Signed)
[facility] Utilization Management Review Sheet    Facility :  [facility]    NAME: Michael [name]  MR#: [id number]    CSN#: [id number]     DOB: 06-28-1955     ROOM: 337/337-A AGE: 61 y.o.    ADMIT DATE AND TIME: 03/05/2017  3:27 AM      PATIENT CLASS: Observation 03/05/2017 @ 0455     ATTENDING PHYSICIAN: Clabe Seal, MD  PAYOR:Payor: MEDICAID HMO / Plan: [employer]AN / Product Type: MANAGED MEDICAID /       AUTH #:     DIAGNOSIS:   TIA (transient ischemic attack) G45.9       HISTORY:   Past Medical History:   Diagnosis Date   . CAD (coronary artery disease)    . Cellulitis of finger of left hand 10/22/14   . Diabetes mellitus    . Gastroesophageal reflux disease    . Hernia    . Hyperlipidemia    . Hypertension    . Myocardial infarction    . Neuropathy     diabetic   . Presence of stent in coronary artery     4 STENTS   . TIA (transient ischemic attack)      "Patient presents to the ED with complaints of right facial numbness and sided weakness as well as chest pressure.  Patient symptoms began at 1700 hrs. on Saturday, March 04, 2017 when he woke up from sleep with left facial numbness and upper and lower extremity weakness and numbness, aphasia. He also had episodic chest pressure. Currently, all symptoms have resolved exception of the left facial numbness." per Dr. Barnet Pall H&P    LABS: HDL 27     VITALS: T97.9 P98 R18 BP139/91 Sat 96%    ASSESSMENT AND PLAN:  1. TIA versus CVA  MRI/MRA studies per stroke protocol  Nothing by mouth   PT/OT/SLP evaluation  2-D echo with bubble study  Fall precautions with neuro checks  Resume outpatient aspirin and Lipitor  Neuro telemetry monitoring    2. Chest pressure in the context of coronary artery disease status post angioplasty and stenting  Currently chest pain/pressure free  Serial troponin and 12-lead EKG; initial workup negative for acute ischemic findings  Resume outpatient aspirin, Lipitor and long acting nitrate  Check lipid panel  Oxygen when  necessary  Further management will depend on clinical progression and results of workup ordered    3. Essential hypertension  Controlled on admission  Allowing permissive hypertension for peripheral blood flow  Treatment parameters set with medications ordered    4. 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 Dyslipidemia  Resume outpatient Lipitor   Check lipid panel     Plan of care discussed with patient and is in agreement as outlined, with verbalization of understanding and agreement    Disposition: Observation admission    GI Prophylaxis: Protonix    DVT Prophylaxis: Heparin    CODE Status : FULL CODE    Admit to Surg Tele on telemetry vitals with pulse Ox q 4 hrs PT/OT eval and treat SCD Neuro Checks q 4 hrs O2 to maintain Sat 92% or greater intake and output head of bed flat fall precautions CCD and heart healthy diet     MEDICATIONS: Aspirin 81mg   PO q morning Lipitor 80mg  PO @ HS Heparin 5000units q 8 hrs Vistaril 50mg  PO @ HS Imdur 30mg  PO q morning Lidoderm 3 patch q 24 hrs Ativan 0.5mg  IV x 1 Protonix 40mg  PO q morning before breakfast Lyrica 100mg  PO 2 times daily Vitamin B12 PO Daily NS@75ml /hr Tylenol 650mg  PO q 4 hrs PRN x 1 Novolog 1-9units @ HS PRN x 1 Novolog 2-16units 3 times daily before meals PRN x 2 Ultram 50mg  PO q 6 hrs PRN x 1     PROGRESS NOTE:  Patient seen and examined    Patient thinks left-sided weakness is improving. He also admits that he has a baseline left-sided weakness as well as numbness from neuropathy.    Pending workup.    DATE OF REVIEW: 03/06/2017    VITALS: BP 123/68   Pulse (!) 101   Temp 98.2 F (36.8 C) (Oral)   Resp 18   Ht 1.727 m (5\' 8" )   Wt 89.4 kg (197 lb 1.5 oz)   SpO2 96%   BMI 29.97 kg/m     Active Hospital Problems     Diagnosis   . TIA (transient ischemic attack)       MEDICATIONS: Aspirin 81mg  PO q morning Liptor 80mg  PO @ HS Heprin 5000units q 8 hrs Vistaril 50mg  PO @ HS Levemir 60untis @ HS Imdur 30mg  PO q morning Lidoderm 3 patch q 24 hrs Protonix 40mg  PO q morning before breakfast Lyrica 100mg  PO 2 times daily Vitamin B12 PO Daily Tylenol 650mg  PO q 4 hrs PRN x 1 Novolog 1-9units @ HS PRN x 1 Novolog 2-16units 3 times daily before meals PRN x 1

## 2017-03-06 NOTE — Consults (Signed)
[facility]  Consultation Note    Date Time: 03/06/17 3:20 PM  Patient Name: Michael [name], Michael [name]  MRN#: [id number]  DOB: 30-Nov-1955  Consulting Physician: Clabe Seal, MD    Reason for Consult:   The patient was seen at the request of Clabe Seal, MD for the evaluation of ?TIA and ?chest pain.    History:   Damari Suastegui is a 61 y.o. male with pmh as below. He presents as a transfer from [facility] with acute onset left facial, arm, and leg paresthesias/numbness.  There was report of possible chest pain, however the patient denies to me.  ECG with NSR/borderline ST with no ST changes.  Troponin negative.  CT head reportedly negative, MRI pending.  His symptoms are largely resolved expect for left facial paresthesias. No dyspnea, palpitations, edema, syncope.    Past Medical History:     Past Medical History:   Diagnosis Date   . CAD (coronary artery disease)    . Cellulitis of finger of left hand 10/22/14   . Diabetes mellitus    . Gastroesophageal reflux disease    . Hernia    . Hyperlipidemia    . Hypertension    . Myocardial infarction    . Neuropathy     diabetic   . Presence of stent in coronary artery     4 STENTS   . TIA (transient ischemic attack)        Past Surgical History:     Past Surgical History:   Procedure Laterality Date   . 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 CERVICAL FUSION     . CIRCUMCISION, > 13 YEARS, ADULT N/A 09/08/2014    Procedure: CIRCUMCISION, > 13 YEARS, ADULT;  Surgeon: Ellwood Handler, MD;  Location: [facility];  Service: Urology;  Laterality: N/A;  CIRCUMCISION   . CORONARY ANGIOPLASTY WITH STENT PLACEMENT      2 stents   . CORONARY ANGIOPLASTY WITH STENT PLACEMENT  12/13/12    DES to OM1, 2 stents   . LUMBAR LAMINECTOMY         Problem List:   Active Problems:    TIA  (transient ischemic attack)      Allergies:     Allergies   Allergen Reactions   . Gabapentin      "Makes crazy/hyper"   . Glucophage [Metformin Hydrochloride] Nausea And Vomiting   . Keflex [Cephalexin] Nausea And Vomiting   . Morphine      Hallucinations   . Victoza [Liraglutide] Nausea And Vomiting       Medications:     Current Facility-Administered Medications   Medication Dose Route Frequency   . aspirin EC  81 mg Oral QAM   . atorvastatin  80 mg Oral QHS   . heparin (porcine)  5,000 Units Subcutaneous Q8H   . hydrOXYzine  50 mg Oral QHS   . insulin detemir  60 Units Subcutaneous QHS   . isosorbide mononitrate  30 mg Oral QAM   . lidocaine  3 patch Transdermal Q24H   . LORazepam  4 mg Intravenous On Call   . pantoprazole  40 mg Oral QAM AC   . pregabalin  100 mg Oral BID   . sodium chloride (PF)  3 mL Intravenous Q8H   . vitamin B-12  1,000  mcg Oral Daily       Prior to Admission medications    Medication Sig Start Date End Date Taking? Authorizing Provider   acetaminophen (TYLENOL) 325 MG tablet Take 2 tablets (650 mg total) by mouth every 4 (four) hours as needed for Fever (temperature greater than 38.5 C). 10/26/14  Yes Hadgu, Inis Sizer, MD   aspirin EC 81 MG EC tablet Take 81 mg by mouth every morning.   Yes [provider]   atorvastatin (LIPITOR) 40 MG tablet Take 40 mg by mouth nightly.   Yes [provider]   hydrOXYzine (VISTARIL) 50 MG capsule Take 1 capsule (50 mg total) by mouth nightly. 03/19/13  Yes Brayton Caves, MD   Insulin Degludec (TRESIBA FLEXTOUCH) 100 UNIT/ML Solution Pen-injector Inject 60 Unit into the skin nightly.       Yes [provider]   isosorbide mononitrate (IMDUR) 30 MG 24 hr tablet Take 30 mg by mouth every morning.     02/28/13  Yes [provider]   lidocaine (LIDODERM) 5 % Place 3 patches onto the skin daily.Remove & Discard patch within 12 hours or as directed by MD   Yes [provider]   metoprolol tartrate (LOPRESSOR) 100 MG  tablet Take 100 mg by mouth 2 (two) times daily.   Yes [provider]   Multiple Vitamin (ONE-A-DAY MENS) Tab Take 1 tablet by mouth every morning.   Yes [provider]   pantoprazole (PROTONIX) 40 MG tablet Take 40 mg by mouth daily.   Yes [provider]   pregabalin (LYRICA) 100 MG capsule Take 100 mg by mouth 2 (two) times daily.   Yes [provider]   traMADol (ULTRAM) 50 MG tablet Take 50 mg by mouth every 6 (six) hours as needed for Pain.   Yes [provider]   vitamin B-12 (CYANOCOBALAMIN) 1000 MCG tablet Take 1,000 mcg by mouth daily.   Yes [provider]   nitroglycerin (NITROSTAT) 0.4 MG SL tablet Place 0.4 mg under the tongue every 5 (five) minutes as needed.    [provider]       Family History:     Family History   Problem Relation Age of Onset   . Heart disease Mother    . Stroke Mother    . Diabetes Mother    . Heart attack Mother    . Heart disease Father    . Stroke Father    . Heart attack Father    . Diabetes Sister        Social History:     Social History     Social History   . Marital status: Single     Spouse name: N/A   . Number of children: N/A   . Years of education: N/A     Occupational History   . Not on file.     Social History Main Topics   . Smoking status: Never Smoker   . Smokeless tobacco: Never Used   . Alcohol use Yes      Comment: once a year maybe   . Drug use: No   . Sexual activity: Not on file     Other Topics Concern   . Not on file     Social History Narrative   . No narrative on file       Review of Systems:     All systems reviewed and negative other than those noted in the HPI.  Physical Exam:     Vitals:    03/06/17 1210   BP: 123/68   Pulse: (!) 101   Resp: 18   Temp: 98.2 F (36.8 C)   SpO2: 96%       Intake and Output Summary (Last 24 hours) at Date Time    Intake/Output Summary (Last 24 hours) at 03/06/17 1520  Last data filed at 03/06/17 1230   Gross per 24 hour   Intake              420 ml    Output             1995 ml   Net            -1575 ml       General/Constitutional: no acute distress  HEENT: moist mucous membranes  Neck: supple, no JVD, no bruits  Heart/Cardiovascular:   Tachycardic  no murmurs/rubs/gallops  2+ radial pulses  2+ DP pulses  2+ PT pulses  No bruits throughout  Respiratory/Lungs: non-labored, clear to auscultation throughout  Abdominal/Gastrointestinal: soft, nontender  Extremities: warm and well-perfused, no edema x4  Skin: no rash  Neurological: alert and oriented x3, grossly nonfocal other than subjective facial numbness on the left  Psych: calm      Labs Reviewed:   Recent CMP       Invalid input(s): TP, AG    Recent CARDIAC ENZYMES   Recent Labs  Lab 03/05/17  0733 03/05/17  0505   Troponin I 0.00 0.00       Recent TSH       Recent PT/PTT       Recent CBC WITH DIFF   Recent Labs  Lab 03/06/17  0932   WBC 4.9   RBC 4.89   Hemoglobin 14.2   Hematocrit 42.1   MCV 86   MPV 7.1   PLT CT 160       Recent LIPID PANEL   Cholesterol (mg/dL)   Date Value   16/01/9603 82   11/18/2016 101   04/27/2016 115    Triglycerides (mg/dL)   Date Value   54/12/8117 146   11/18/2016 253 (H)   04/27/2016 329 (H)    HDL (mg/dL)   Date Value   14/78/2956 27 (L)   11/18/2016 31 (L)   04/27/2016 27 (L)    LDL Calculated (mg/dL)   Date Value   21/30/8657 26   11/18/2016 19   04/27/2016 22        Rads:     Radiology Results (24 Hour)     ** No results found for the last 24 hours. **          Cardiovascular Workup:       LHC 10/21/11 (Dr. Duffy Rhody)  1. Prior right coronary stent which remains patent with minimal luminal encroachment.  2. 3-vessel atherosclerosis without significant obstructive disease.  3. Normal overall systolic function with focal inferior basal hypokinesis  RECOMMENDATIONS: Medical management with vigorous secondary prevention.    Echo 10/16/12  1. The left ventricle is thickened in a fashion consistent with mild concentric hypertrophy. Global systolic function was normal. The left  ventricular ejection fraction is estimated to be 55-60%.   2. Diffuse thickening (sclerosis) without reduced excursion in the aortic valve. There is evidence of trivial (trace) aortic regurgitation.   3. There is evidence of trivial (trace) mitral regurgitation. There is evidence of sclerosis of the mitral valve.   4. There is evidence of trace (trivial)  tricuspid regurgitation. The estimated right ventricular systolic pressure is 18 mmHg.   5. There is no evidence of pericardial effusion.   6. There is no previous echocardiogram available for comparison.     ABI/PVR 10/16/12  1. Normal bilateral ABIs at rest and post exercise.   2. Limited PVRs at ankles and toe pressures are normal.    Nuclear Stress Test 12/03/12  1. LV systolic function appears moderately reduced   2. Calculated LVEF 38%   3. Gated images show moderate global hypokinesis.   4. Perfusion images show a large area of ischemia from base to distal in the infero-lateral wall with atleast moderate reversibility.  5. High risk study due to size of defect and reduced EF.    Coronary angioplasty 12/13/12  1. 99% proximal OM2 stenosis status post percutaneous balloon angioplasty followed by drug-eluting stent placement, Xience 3.25 x 23 mm.  2. Prior stents in RCA and proximal left circumflex noted to be patent.    Nuclear Stress Test 03/18/13  1. Comparing the baseline and the stress images, no significant fixed or reversible perfusion defect noted.  2. Gated imaging shows normal EF.  3. Low risk Lexiscan nuclear stress test.    LHC 03/19/13  1. Patent stents in the RCA and circumflex marginal.  2. Significant 1st diagonal branch disease.  3. Normal LV systolic and diastolic function.    Nuclear Stress Test 04/12/14  1. Unremarkable Lexiscan Cardiolite stress test without evidence for infarct or ischemia, and visually normal-appearing left ventricular ejection fraction.    Sleep Study 08/20/15  1. Moderate sleep apnea with significant  desaturations.  2. RECOMMENDATION: For the patient to undergo a full night CPAP titration for the moderate sleep apnea with desaturations.    Nuclear stress test 04/28/16  There is a small, moderate in severity, predominantly fixed apical lateral wall perfusion defect with normal wall motion and thickening on gated images.  This is most consistent with artifact (possibly apical thinning artifact) with small scar being less  likely.  No ischemia is seen.   Normal left ventricular systolic function.      Nuclear stress test 11/19/16  Perfusion Findings:Small sized, apical, perfusion defect with moderate reversibility.  Abnormal but low risk lexiscan nuclear stress test    Assessment:   Chadwick Reiswig is 61 y.o. male with    1. Left sided numbness/paresthesias  2. CAD, stable  3. Cerebrovascular disease, h/o TIA  4. HLD   5. OSA  6. DM II    Recommendations:     1. Agree with evaluation as ordered.  Continue cardiac meds.    Thank you for consulting and allowing [place] to participate in this patient's care! We will follow along while the patient is in the hospital.    Seward Speck, MD, [facility], RPVI  Interventional Cardiology  [facility]  [address], [address]  [place], [place] [address]  Tel [phone]  Fax [phone]

## 2017-03-06 NOTE — Plan of Care (Signed)
Problem: Neurological Deficit  Goal: Neurological status is stable or improving  Outcome: Progressing      Comments: Received report & resumed care of patient. No distress observed. Discussed bed alarm with patient, as he rates as a high falls. Patient agreeable to bed alarm at this time. Bed alarm on for safety. Placed note in epic for Dr Lanny Hurst in regards to MRI. Will continue to monitor patient. CBIR.

## 2017-03-07 ENCOUNTER — Observation Stay: Payer: 59

## 2017-03-07 DIAGNOSIS — R531 Weakness: Secondary | ICD-10-CM

## 2017-03-07 DIAGNOSIS — G459 Transient cerebral ischemic attack, unspecified: Secondary | ICD-10-CM

## 2017-03-07 LAB — ECG 12-LEAD
P Wave Axis: 63 deg
P-R Interval: 168 ms
Patient Age: 61 years
Q-T Interval(Corrected): 402 ms
Q-T Interval: 294 ms
QRS Axis: 50 deg
QRS Duration: 75 ms
T Axis: -12 years
Ventricular Rate: 112 //min

## 2017-03-07 LAB — VITAMIN B12 AND FOLATE
Folate: 10.6 ng/mL (ref 7.0–19.9)
Vitamin B-12: 1487 pg/mL — ABNORMAL HIGH (ref 213–816)

## 2017-03-07 LAB — C-REACTIVE PROTEIN: C-Reactive Protein: <0.02 mg/dL — ABNORMAL LOW (ref 0.02–0.80)

## 2017-03-07 LAB — VH ASPIRIN RESISTANCE: Aspirin Resistance: 593 {ARU} — ABNORMAL HIGH (ref 10–550)

## 2017-03-07 LAB — VH DEXTROSE STICK GLUCOSE
Glucose POCT: 154 mg/dL — ABNORMAL HIGH (ref 71–99)
Glucose POCT: 153 mg/dL — ABNORMAL HIGH (ref 71–99)
Glucose POCT: 224 mg/dL — ABNORMAL HIGH (ref 71–99)
Glucose POCT: 244 mg/dL — ABNORMAL HIGH (ref 71–99)
Glucose POCT: 248 mg/dL — ABNORMAL HIGH (ref 71–99)

## 2017-03-07 LAB — TROPONIN I
Troponin I: 0 ng/mL (ref 0.00–0.02)
Troponin I: <0.01 ng/mL (ref 0.00–0.02)

## 2017-03-07 LAB — T4, FREE: T4 Free: 1.08 ng/dL (ref 0.70–1.48)

## 2017-03-07 LAB — TSH: TSH: 0.36 u[IU]/mL — ABNORMAL LOW (ref 0.40–4.20)

## 2017-03-07 LAB — SEDIMENTATION RATE: Sed Rate: 2 mm/hr (ref 0–20)

## 2017-03-07 MED ORDER — GADOTERATE MEGLUMINE 10 MMOL/20ML IV SOLN
20.0000 mL | Freq: Once | INTRAVENOUS | Status: AC | PRN
Start: 2017-03-07 — End: 2017-03-07
  Administered 2017-03-07: 21:00:00 10 mmol via INTRAVENOUS

## 2017-03-07 MED ORDER — CARVEDILOL 3.125 MG PO TABS
3.1250 mg | ORAL_TABLET | Freq: Two times a day (BID) | ORAL | Status: DC
Start: 2017-03-07 — End: 2017-03-08
  Administered 2017-03-07 – 2017-03-08 (×2): 3.125 mg via ORAL
  Filled 2017-03-07 (×3): qty 1

## 2017-03-07 MED ORDER — SODIUM CHLORIDE BACTERIOSTATIC 0.9 % IJ SOLN
INTRAMUSCULAR | Status: AC
Start: 2017-03-07 — End: ?
  Filled 2017-03-07: qty 10

## 2017-03-07 MED ORDER — LORAZEPAM 2 MG/ML IJ SOLN
4.0000 mg | Freq: Once | INTRAMUSCULAR | Status: AC
Start: 2017-03-07 — End: 2017-03-07
  Administered 2017-03-07: 20:00:00 4 mg via INTRAVENOUS
  Filled 2017-03-07: qty 2

## 2017-03-07 NOTE — Progress Notes (Signed)
Daily assessment completed this am. Pt. up in  Bed. No acute distress noted.

## 2017-03-07 NOTE — PT Plan of Care Note (Signed)
[facility]  [facility]  Department of Rehabilitation Services  [phone]  Adis Sturgill CSN: [id number]  SURG TELEMETRY STEP-DOWN 337/337-A    Physical Therapy General Note  Time: 1400, 1500    Last seen by a Physical Therapist vs. PTA: 03/07/17    Attempted to see patient for PT treatment.  Patient is down at cardiac lab this afternoon.      Team Communication: Arlys John, RN    Plan: Will follow up tomorrow per POC.     Sydell Axon, PT, DPT

## 2017-03-07 NOTE — PT Plan of Care Note (Signed)
[facility]  [facility]  Department of Rehabilitation Services  [phone]  Aqil Goetting CSN: [id number]  SURG TELEMETRY STEP-DOWN 337/337-A    Physical Therapy General Note  Time: 75    Last seen by a Physical Therapist vs. PTA: 03/06/17    Attempted to see patient for PT treatment. Nurse reporting that patient is experiencing chest pressure.  Not appropriate for PT.  Nurse at bedside assessing.    Team Communication: RN Arlys John    Plan: Will follow up per POC.     Sydell Axon, PT, DPT

## 2017-03-07 NOTE — Progress Notes (Signed)
Tele trending reveals S.R. With no arrythmia's noted. Pt. Had echo completed today. Awaiting MRI scan. No acute distress noted.

## 2017-03-07 NOTE — Progress Notes (Signed)
[facility]   [address]   [place] [address]     Case Manager    Patient identified as a Moderate Risk for readmission based on the current risk score  Estimated D/C Date: TBD, MD entry is 11/20   Risk Score: 13%   RX Coverage:   Yes   Utilize D/C Medication Program: PRN   Confirmed PCP with patient: name/last visit: Yes, [facility].  <6 months   Confirmed Transportation to F/u Appt: Pt drives self.  MAY NEED D/C TRANSPORTATION ASSISTANCE   PCP F/U Appt request is placed in the Navigator: AA to arranged   Anticipated DME needed for D/C: Not at this time   Anticipated HH, arrange if appropriate: Pt agreeable to [facility] for PT/OT   Anticipated Placement:  Referral to DCP: N/A   Inpatient Plan of Care:    CVA work up, Neuro consulted, awaiting MRI.  Echo completed, PT/OT/SLP consulted per CVA protocol.   CM Interventions:    Reviewed chart/ discuss d/c planning with patient.  RNCM to send referral [facility] 11/21.  RNCM and patient to determine transport needs.        03/07/17 1641   Patient Type   Within 30 Days of Previous Admission? No   Healthcare Decisions   Interviewed: Patient   Orientation/Decision Making Abilities of Patient Alert and Oriented x3, able to make decisions   Prior to admission   Prior level of function Independent with ADLs;Ambulates independently   Type of Residence Private residence   Home Layout One level  (No steps)   Have running water, electricity, heat, etc? Yes   Living Arrangements Alone   How do you get to your MD appointments? Drives   How do you get your groceries? Drives   Who fixes your meals? Self   Who does your laundry? Self   Who picks up your prescriptions? Self   Dressing Independent   Grooming Independent   Feeding Independent   Bathing Independent   Toileting Independent   DME Currently at Home ("walking stick when I need it.")   Home Care/Community Services None   Discharge Planning   Support Systems Family members;Church/faith  community;Friends/neighbors   Patient expects to be discharged to: Home   Anticipated [place] plan discussed with: Same as interviewed   Mode of transportation: (May need transport assistance)   Consults/Providers   PT Evaluation Needed 1  (Ongoing)   OT Evalulation Needed 1  (Ongoing)   SLP Evaluation Needed 1  (Completed)   Correct PCP listed in Epic? Yes     Carolan Clines, RN BSN  Nurse Case Manager  [facility]  [phone]

## 2017-03-07 NOTE — Consults (Signed)
[facility] Patient                    [facility]  [address]  [place], [place] [address]  [facility]: [phone]   FX: [phone]    NEUROLOGY CONSULT  NOTE    Date / Time: 03/07/17 11:45 AM  Patient Name: Michael [name]  Attending Physician: Clabe Seal, MD      CHIEF COMPLAINT:   No chief complaint on file.      HISTORY OF PRESENT ILLNESS:   [name] ZHY:[id number], MRN: [id number], is a 61 y.o. male with history of HTN, HLD, CAD, DM, and chronic peripheral neuropathy, being evaluated for worsening left sided weakness and numbness and new onset left facial numbness.   Patient reports that he has left sided weakness and numbness at baseline, which he attributes to his longstanding neuropathy. The worsening symptoms he initially felt have since improved back to baseline. Left facial numbness persists.     Patient has not been able to tolerate MRI/MRA due to claustrophobia. Plan to attempt again today with more medications.     Patient currently complaining of chest pressure, which he reports that he feels intermittently, but it just started again a few minutes ago. Nursing staff administered nitroglycerin. EKG reportedly normal. Troponins ordered to be drawn.      REVIEW OF SYSTEMS:   No headache. + new right face numbness. Baseline left arm and leg weakness tingling and numbness. No speech change. No visual change. No swallowing change. No Cough - SOB. No seizure activity. +chest pain. No abdominal pain. No nausea.    A ten review of systems, including HEENT, CVS, CNS, respiratory, GI, GU, endocrine, hematologic, lymphatic, dermatologic was unremarkable except for the ones mentioned in the HPI.     CURRENT PROBLEM LIST:     Active Hospital Problems    Diagnosis   . TIA (transient ischemic attack)       PAST MEDICAL HISTORY:     Past Medical History:   Diagnosis Date   . CAD (coronary artery disease)    . Cellulitis of finger of left hand 10/22/14   . Diabetes mellitus    .  Gastroesophageal reflux disease    . Hernia    . Hyperlipidemia    . Hypertension    . Myocardial infarction    . Neuropathy     diabetic   . Presence of stent in coronary artery     4 STENTS   . TIA (transient ischemic attack)        FAMILY HISTORY:     Family History   Problem Relation Age of Onset   . Heart disease Mother    . Stroke Mother    . Diabetes Mother    . Heart attack Mother    . Heart disease Father    . Stroke Father    . Heart attack Father    . Diabetes Sister         SOCIAL HISTORY:     Social History     Social History   . Marital status: Single     Spouse name: N/A   . Number of children: N/A   . Years of education: N/A     Social History Main Topics   . Smoking status: Never Smoker   . Smokeless tobacco: Never Used   . Alcohol use Yes      Comment: once a year maybe   . Drug use:  No   . Sexual activity: Not Asked     Other Topics Concern   . None     Social History Narrative   . None       ALLERGIES:     Allergies   Allergen Reactions   . Gabapentin      "Makes crazy/hyper"   . Glucophage [Metformin Hydrochloride] Nausea And Vomiting   . Keflex [Cephalexin] Nausea And Vomiting   . Morphine      Hallucinations   . Victoza [Liraglutide] Nausea And Vomiting       MEDICATIONS:     Prior to Admission medications    Medication Sig Start Date End Date Taking? Authorizing Provider   acetaminophen (TYLENOL) 325 MG tablet Take 2 tablets (650 mg total) by mouth every 4 (four) hours as needed for Fever (temperature greater than 38.5 C). 10/26/14  Yes Hadgu, Inis Sizer, MD   aspirin EC 81 MG EC tablet Take 81 mg by mouth every morning.   Yes [provider]   atorvastatin (LIPITOR) 40 MG tablet Take 40 mg by mouth nightly.   Yes [provider]   hydrOXYzine (VISTARIL) 50 MG capsule Take 1 capsule (50 mg total) by mouth nightly. 03/19/13  Yes Brayton Caves, MD   Insulin Degludec (TRESIBA FLEXTOUCH) 100 UNIT/ML Solution Pen-injector Inject 60 Unit into the skin nightly.       Yes [provider]   isosorbide mononitrate (IMDUR) 30 MG 24 hr tablet Take 30 mg by mouth every morning.     02/28/13  Yes [provider]   lidocaine (LIDODERM) 5 % Place 3 patches onto the skin daily.Remove & Discard patch within 12 hours or as directed by MD   Yes [provider]   metoprolol tartrate (LOPRESSOR) 100 MG tablet Take 100 mg by mouth 2 (two) times daily.   Yes [provider]   Multiple Vitamin (ONE-A-DAY MENS) Tab Take 1 tablet by mouth every morning.   Yes [provider]   pantoprazole (PROTONIX) 40 MG tablet Take 40 mg by mouth daily.   Yes [provider]   pregabalin (LYRICA) 100 MG capsule Take 100 mg by mouth 2 (two) times daily.   Yes [provider]   traMADol (ULTRAM) 50 MG tablet Take 50 mg by mouth every 6 (six) hours as needed for Pain.   Yes [provider]   vitamin B-12 (CYANOCOBALAMIN) 1000 MCG tablet Take 1,000 mcg by mouth daily.   Yes [provider]   nitroglycerin (NITROSTAT) 0.4 MG SL tablet Place 0.4 mg under the tongue every 5 (five) minutes as needed.    [provider]       Current Facility-Administered Medications   Medication Dose Route Frequency   . aspirin EC  81 mg Oral QAM   . atorvastatin  80 mg Oral QHS   . heparin (porcine)  5,000 Units Subcutaneous Q8H   . hydrOXYzine  50 mg Oral QHS   . insulin detemir  60 Units Subcutaneous QHS   . isosorbide mononitrate  30 mg Oral QAM   . lidocaine  3 patch Transdermal Q24H   . LORazepam  4 mg Intravenous Once   . pantoprazole  40 mg Oral QAM AC   . pregabalin  100 mg Oral BID   . sodium chloride (PF)  3 mL Intravenous Q8H   . vitamin B-12  1,000 mcg Oral Daily            PHYSICAL  EXAM:     Vitals:    03/07/17 1123   BP: 128/82   Pulse:    Resp: 20   Temp:    SpO2: 98%       General: Patient is a 61 year old male, in mild distress due to chest pressure and associated anxiety.    Mental status: Awake, alert, and oriented to all spheres with fluent  speech and appropriate affect. Recent and remote memory intact.  Cranial Nerve Exam:  CN 2 Normal vision without visual field defects   CN 3,4,6 EOMs intact without nystagmus, PERRLA  CN 5 Facial sensation is symmetrical.  CN 7 Spontaneous facial expressions are symmetrical.  CN 8 Auditory acuity intact bilaterally to casual conversation.  CN 9 Palate rises with phonation.  CN 11 Trapezius and sternocleidomastoids intact.  CN 12 Tongue protrudes in midline.  Motor: Power is 5/5 right extremities, 4/5 left extremities.  Normal tone and bulk for age. No drift, dystonia or tremors appreciated.   Sensory:  Sensation decreased on left side compared to right.   Coordination: Rapid alternating movements, brisk, coordinated and symmetric.   DTRs: 2+ and symmetric bilaterally  Gait: not tested.     HEENT:  Head is midline, normocephalic, atraumatic.   Cardiac:  Tachycardic  Chest:  Breath sounds CTA bilaterally.  Abd:  Bowel sounds normoactive in all quadrants.    Extremities:  No deformities, cyanosis, clubbing or edema. No new rash or bruise.    SIGNIFICANT LABS:     Results     Procedure Component Value Units Date/Time    Troponin I [[id number]] Collected:  03/07/17 1135    Specimen:  Plasma Updated:  03/07/17 1141    Dextrose Stick Glucose [[id number]]  (Abnormal) Collected:  03/07/17 0849    Specimen:  Blood Updated:  03/07/17 0911     Glucose, POCT 153 (H) mg/dL     Dextrose Stick Glucose [[id number]]  (Abnormal) Collected:  03/07/17 0333    Specimen:  Blood Updated:  03/07/17 0349     Glucose, POCT 154 (H) mg/dL     Dextrose Stick Glucose [[id number]]  (Abnormal) Collected:  03/06/17 2035    Specimen:  Blood Updated:  03/06/17 2051     Glucose, POCT 221 (H) mg/dL     Dextrose Stick Glucose [[id number]]  (Abnormal) Collected:  03/06/17 1637    Specimen:  Blood Updated:  03/06/17 1653     Glucose, POCT 244 (H) mg/dL     Dextrose Stick Glucose [[id number]]  (Abnormal) Collected:  03/06/17 1211    Specimen:  Blood Updated:   03/06/17 1227     Glucose, POCT 252 (H) mg/dL           SIGNIFICANT IMAGING:     MRI Brain without contrast    (Results Pending)   MRI Angiogram Head without contrast    (Results Pending)   MRI Angiogram Neck with and without contrast    (Results Pending)   Echocardiogram Adult Comp with Clr/Dop/Bubble    (Results Pending)           ASSESSMENT AND PLAN:   Michael [name], 61 year old male being evaluated for new left facial numbness. Also with left sided extremity weakness and numbness which is reportedly his baseline. Patient currently complaining of chest pressure, which is taking precedence.     1. Diagnostics:  1. MRI brain pending. Was unable to tolerate previously due to claustrophobia.   2. MRA head/neck pending.   3. ECHO pending  2. Labs:   1. Stroke labs.   1. LDL 26  2. Hgba1c 9.5, continuing to trend down.  3. TSH, free T4, ESR, CRP, B12, folate pending  4. Aspirin resistance test pending  5. Troponins negative on admission. Repeat pending.   3. Medications:   1. Patient currently on aspirin 81mg . Will consider transition to plavix, pending MRI.   2. Patient on high dose statin.  3. Hospitalist managing medication for claustrophobia prior to MRI.   4. Rehabilitation services:   1. PT to evaluate and treat.    Case discussed with Dr. Sherilyn Cooter who will additionally review diagnostics/labs, personally evaluate the patient, further determine the treatment plan, and amend this note as indicated. Thank you for the consultation.    Cherah N Charter, AGAC-NP  03/07/2017  11:45 AM  [id number]       ADDENDUM     I personally evaluated the patient and agree with the formulation and plan as noted above and edited by me.    Now isolated facial numbness without asymmetry of visual fields or processing, no motor manifestations. An atypical clinical manifestation for stroke though a small subcortical (e.g., thalamic) infarct could be partially masked by pre-existing sensory deficits in the the LUE and LLE.      Signed by:  Roslyn Smiling, MD  Neurology / Epilepsy / Neurophysiology  03/07/2017 3:37 PM

## 2017-03-07 NOTE — Plan of Care (Signed)
Problem: Neurological Deficit  Goal: Neurological status is stable or improving  Outcome: Progressing   03/07/17 2344   Goal/Interventions addressed this shift   Neurological status is stable or improving Monitor/assess/document neurological assessment (Stroke: every 4 hours);Observe for seizure activity and initiate seizure precautions if indicated     Neuro checks to be completed Q4 this shift. So far no s/s of neuro changes or seizure activity.

## 2017-03-07 NOTE — Progress Notes (Addendum)
[facility]  PROGRESS NOTE      Patient: Michael [name]  Date: 03/07/2017   LOS: 0 Days  Admission Date: 03/05/2017   MRN: [id number]  Attending: Clabe Seal, MD       INTERIM SUMMARY     Admitted with left  facial numbness, left sided weakness and numbness and Chest pressure.  Patient says he has a baseline left-sided weakness and numbness he just thought it got worse and initially and now he is saying it's feeling better to baseline. Still has left sided facial tingling he says he didn't have this before. Noncontrast CT at an outlying facility reported as negative Patient's MRI studies could not be done at first attempt because of his being claustrophobic. More Ativan ordered to have the studies done.      SUBJECTIVE     Michael [name] states Feeling better with left-sided weakness   still feels some numbness on the left side of the face.  Started having chest pressure as MRI time gets closer      NAD otherwise      ASSESSMENT/PLAN     Michael [name] is a 61 y.o. male            1. TIA versus CVA  MRI/MRA studies still pending per stroke protocol--IV Ativan ordered because of claustrophobia  Nothing by mouth   PT/OT  SLP evaluation--did ok  2-D echo with bubble study--pending  Fall precautions with neuro checks  Resume outpatient aspirin and Lipitor  Neuro telemetry monitoring  Neurology consult    2. Chest pressure in the context of coronary artery disease status post angioplasty and stenting  --consulted cards..Dr Orvis Brill though stable    troponins negative initially  Did new ekg -no sig finding except tachy mild  TNI x 2 ordred  Start coreg as BB.    3. Essential hypertension  Controlled on admission  Allowing permissive hypertension for peripheral blood flow  Treatment parameters set with medications ordered    4. 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 Dyslipidemia  Resume outpatient Lipitor   Check lipid panel       DVT Prophylaxis: Heparin subq    Disposition: As per pending workup    Please contact me via cortext for routine matters and via phone [phone] for urgent matters.         Code Status: Full Code             MEDICATIONS     Current Facility-Administered Medications   Medication Dose Route Frequency   . aspirin EC  81 mg Oral QAM   . atorvastatin  80 mg Oral QHS   . heparin (porcine)  5,000 Units Subcutaneous Q8H   . hydrOXYzine  50 mg Oral QHS   . insulin detemir  60 Units Subcutaneous QHS   . isosorbide mononitrate  30 mg Oral QAM   . lidocaine  3 patch Transdermal Q24H   . LORazepam  4 mg Intravenous Once   . pantoprazole  40 mg Oral QAM AC   . pregabalin  100 mg Oral BID   . sodium chloride (PF)  3 mL Intravenous Q8H   . vitamin B-12  1,000 mcg Oral Daily       PHYSICAL EXAM  Vitals:    03/07/17 1123   BP: 128/82   Pulse:    Resp: 20   Temp:    SpO2: 98%       Temperature: Temp  Min: 96.6 F (35.9 C)  Max: 99.9 F (37.7 C)  Pulse: Pulse  Min: 91  Max: 118  Respiratory: Resp  Min: 16  Max: 24  Non-Invasive BP: BP  Min: 114/70  Max: 140/87  Pulse Oximetry SpO2  Min: 92 %  Max: 98 %      Constitutional: Seems to be NAD   HEENT: NC/AT, PERRL,EOMI. Neck supple  Cardiovascular: RRR, normal S1 S2, no murmurs, gallops.  Respiratory:  CTA bilaterally Normal rate. No retractions or increased work of breathing.  Gastrointestinal: Normal BS, non-distended, soft, non-tender, no rebound or guarding.  Genitourinary: no suprapubic or costovertebral angle tenderness  Musculoskeletal: ROM and motor strength grossly normal. No clubbing, edema.   Skin: no rashes, jaundice , cyanosis  Neurologic: AAO x3.  CN 2-12 grossly intact except some numbness left face. Left upper and lower extremity very mild weakness if any and some numbness. No gross motor or sensory  deficits  Psychiatric: Affect and mood appropriate. Cooperative.     LABS       Recent Labs  Lab 03/06/17  0932   WBC 4.9   RBC 4.89   Hemoglobin 14.2   Hematocrit 42.1   MCV 86   PLT CT 160         Recent Labs  Lab 03/07/17  1135 03/05/17  0733 03/05/17  0505   Troponin I 0.00 0.00 0.00             Microbiology Results     None           RADIOLOGY     No results found.      Eliott Nine, MD  2:27 PM 03/07/2017

## 2017-03-07 NOTE — UM Notes (Signed)
[facility] Utilization Management Review Sheet    Facility :  [facility]    NAME: Michael [name]  MR#: [id number]    CSN#: [id number]     DOB: 02/14/1956     ROOM: 337/337-A AGE: 61 y.o.    ADMIT DATE AND TIME: 03/05/2017  3:27 AM      PATIENT CLASS: Observation 03/05/2017 @ 0455     ATTENDING PHYSICIAN: Clabe Seal, MD  PAYOR:Payor: MEDICAID HMO / Plan: [employer] / Product Type: MANAGED MEDICAID /       AUTH #:     DIAGNOSIS:   TIA (transient ischemic attack) G45.9       HISTORY:   Past Medical History:   Diagnosis Date   . CAD (coronary artery disease)    . Cellulitis of finger of left hand 10/22/14   . Diabetes mellitus    . Gastroesophageal reflux disease    . Hernia    . Hyperlipidemia    . Hypertension    . Myocardial infarction    . Neuropathy     diabetic   . Presence of stent in coronary artery     4 STENTS   . TIA (transient ischemic attack)        DATE OF REVIEW: 03/07/2017    VITALS: BP 128/82   Pulse (!) 110   Temp 97.5 F (36.4 C)   Resp 20   Ht 1.727 m (5\' 8" )   Wt 88.3 kg (194 lb 10.7 oz)   SpO2 98%   BMI 29.60 kg/m     Active Hospital Problems    Diagnosis   . TIA (transient ischemic attack)     LABS: TSH 0.36 C Reactive Protein <0.02     MEDICAL:  Michael [name] is a 61 y.o. male            1. TIA versus CVA  MRI/MRA studies still pending per stroke protocol--IV Ativan ordered because of claustrophobia  Nothing by mouth   PT/OT  SLP evaluation--did ok  2-D echo with bubble study--pending  Fallprecautions with neuro checks  Resume outpatient aspirin and Lipitor  Neuro telemetry monitoring  Neurology consult    2. Chest pressure in the context of coronary artery disease status post angioplasty and stenting  --consulted cards..Dr Orvis Brill though stable    troponins negative initially  Did new ekg -no sig finding except tachy mild  TNI x 2 ordred  Start coreg as BB.    3. Essential hypertension  Controlled on admission  Allowing permissive hypertension for  peripheral blood flow  Treatment parameters set with medications ordered    4. 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 Dyslipidemia  Resume outpatient Lipitor   Check lipid panel       DVT Prophylaxis: Heparin subq    Disposition: As per pending workup    Please contact me via cortext for routine matters and via phone [phone] for urgent matters.         NEUROLOGY:  Michael [name], 60 year old male being evaluated for new left facial numbness. Also with left sided extremity weakness and numbness which is reportedly his baseline. Patient currently complaining of chest pressure, which is taking precedence.     1. Diagnostics:  1. MRI brain pending.  Was unable to tolerate previously due to claustrophobia.   2. MRA head/neck pending.   3. ECHO pending  2. Labs:   1. Stroke labs.   1. LDL 26  2. Hgba1c 9.5, continuing to trend down.  3. TSH, free T4, ESR, CRP, B12, folate pending  4. Aspirin resistance test pending  5. Troponins negative on admission. Repeat pending.   3. Medications:   1. Patient currently on aspirin 81mg . Will consider transition to plavix, pending MRI.   2. Patient on high dose statin.  3. Hospitalist managing medication for claustrophobia prior to MRI.   4. Rehabilitation services:   1. PT to evaluate and treat.    Case discussed with Dr. Sherilyn Cooter who will additionally review diagnostics/labs, personally evaluate the patient, further determine the treatment plan, and amend this note as indicated. Thank you for the consultation.    MEDICATIONS: Aspirin 81mg  PO q morning Lipitor 80mg  PO @ HS Coreg 3.125mg  PO q 12 hrs Heparin 5000units q 8 hrs Vistaril 50mg  PO @ HS Levemir 60units @ HS Isosoride 30mg  PO q morning Lidocaine 3 patch q 24 hrs Ativan 4mg  IV x 1 Protonix  40mg  PO q morning before breakfast Lyrica 100mg  PO 2 times daily Vitamin B12 PO Daily Novolog 1-9units @ HS PRN x 1 Novolog 2-16untis 3 times daily before meals PRN x 2 Nitrostat 0.4mg  q 5 min PRN x 1     Michael [name] L. [facility] RN BSN  [facility]  Utilization Review  [address]  [place] [address]  [phone]  Fax [phone]

## 2017-03-08 DIAGNOSIS — R531 Weakness: Secondary | ICD-10-CM | POA: Diagnosis present

## 2017-03-08 LAB — VH DEXTROSE STICK GLUCOSE
Glucose POCT: 265 mg/dL — ABNORMAL HIGH (ref 71–99)
Glucose POCT: 235 mg/dL — ABNORMAL HIGH (ref 71–99)
Glucose POCT: 260 mg/dL — ABNORMAL HIGH (ref 71–99)

## 2017-03-08 MED ORDER — METOPROLOL TARTRATE 50 MG PO TABS
50.0000 mg | ORAL_TABLET | Freq: Two times a day (BID) | ORAL | Status: DC
Start: 2017-03-08 — End: 2017-03-08
  Filled 2017-03-08: qty 1

## 2017-03-08 MED ORDER — LIDOCAINE 5 % EX PTCH
2.0000 | MEDICATED_PATCH | CUTANEOUS | Status: DC
Start: 2017-03-09 — End: 2017-03-08
  Filled 2017-03-08: qty 2

## 2017-03-08 MED ORDER — CLOPIDOGREL BISULFATE 75 MG PO TABS
75.0000 mg | ORAL_TABLET | Freq: Every day | ORAL | 0 refills | Status: AC
Start: 2017-03-09 — End: ?

## 2017-03-08 MED ORDER — CLOPIDOGREL BISULFATE 75 MG PO TABS
75.0000 mg | ORAL_TABLET | Freq: Every day | ORAL | Status: DC
Start: 2017-03-08 — End: 2017-03-08
  Administered 2017-03-08: 11:00:00 75 mg via ORAL
  Filled 2017-03-08: qty 1

## 2017-03-08 NOTE — Progress Notes (Signed)
03/08/17 1305   Case Management Quick Doc   Case Management Assessment Status Assessment Complete   CM Comments 11/21  RNCM  D/C orders written.  HH ARRANGED with [facility].  Pt confirmed transportation arrangement with family member.  Pt updated per [facility] arrangments.      Carolan Clines, RN BSN  Nurse Case Manager  [facility]  [phone]

## 2017-03-08 NOTE — PT Plan of Care Note (Signed)
[facility]  Outpatient Care[facility]  Department of Rehabilitation Services  [phone]  Amrom Ore CSN: [id number]  SURG TELEMETRY STEP-DOWN 337/337-A    Physical Therapy General Note  Time: 1500    Last seen by a Physical Therapist vs. PTA: 03/06/17    Attempted to see patient for PT treatment.  Patient is reviewing discharge instructions with nursing at this time.      Team Communication: Arlys John, RN    Plan: Will follow up if not discharged per POC.     Sydell Axon, PT, DPT

## 2017-03-08 NOTE — Progress Notes (Signed)
Progress Note Neurology    Tim Corriher VHQ:[id number],LKG:[id number] is a 61 y.o. male,   No chief complaint on file.    03/08/2017   Active Problems:    TIA (transient ischemic attack)    Past Medical History:   Diagnosis Date   . CAD (coronary artery disease)    . Cellulitis of finger of left hand 10/22/14   . Diabetes mellitus    . Gastroesophageal reflux disease    . Hernia    . Hyperlipidemia    . Hypertension    . Myocardial infarction    . Neuropathy     diabetic   . Presence of stent in coronary artery     4 STENTS   . TIA (transient ischemic attack)        Social History     Social History   . Marital status: Single     Spouse name: N/A   . Number of children: N/A   . Years of education: N/A     Social History Main Topics   . Smoking status: Never Smoker   . Smokeless tobacco: Never Used   . Alcohol use Yes      Comment: once a year maybe   . Drug use: No   . Sexual activity: Not on file     Other Topics Concern   . Not on file     Social History Narrative   . No narrative on file     Family History   Problem Relation Age of Onset   . Heart disease Mother    . Stroke Mother    . Diabetes Mother    . Heart attack Mother    . Heart disease Father    . Stroke Father    . Heart attack Father    . Diabetes Sister        Interval History   Admitted for worsened left sided weakness and left facial numbness.  Completed MRI overnight.   Symptoms improved      Subjective/ROS:     No headache, No chest pain, No abdominal pain - No Nausea, No new weakness tingling or numbness. No speech change.No visual change. No swallowing change. No Cough - SOB. No  seizure activity    Objective:     Vitals:    03/08/17 0351 03/08/17 0357 03/08/17 0728 03/08/17 0833   BP: 112/64  105/57 105/57   Pulse: 95  92 92   Resp: 12  (!) 24    Temp: (!) 96.3 F (35.7 C)  (!) 96.8 F (36 C)    TempSrc: Oral  Oral    SpO2: 93%  96%    Weight:  85.3 kg (188 lb 0.8 oz)     Height:               Intake/Output Summary (Last 24 hours) at 03/08/17  0836  Last data filed at 03/08/17 0358   Gross per 24 hour   Intake             1110 ml   Output             1750 ml   Net             -640 ml     Scheduled Meds:  Current Facility-Administered Medications   Medication Dose Route Frequency   . aspirin EC  81 mg Oral QAM   . atorvastatin  80 mg Oral QHS   . carvedilol  3.125  mg Oral Q12H SCH   . heparin (porcine)  5,000 Units Subcutaneous Q8H   . hydrOXYzine  50 mg Oral QHS   . insulin detemir  60 Units Subcutaneous QHS   . isosorbide mononitrate  30 mg Oral QAM   . lidocaine  3 patch Transdermal Q24H   . pantoprazole  40 mg Oral QAM AC   . pregabalin  100 mg Oral BID   . sodium chloride (PF)  3 mL Intravenous Q8H   . vitamin B-12  1,000 mcg Oral Daily     Continuous Infusions:  PRN Meds:.acetaminophen **OR** acetaminophen **OR** acetaminophen, albuterol-ipratropium, dextrose, glucagon (rDNA), hydrALAZINE, influenza, insulin aspart, insulin aspart, naloxone, nitroglycerin, ondansetron **OR** ondansetron, prochlorperazine, traMADol    Exam  General: Patient is a 61 year old male, no acute distress.  Mental status: Awake, alert, and oriented to all spheres with fluent speech and appropriate affect. Recent and remote memory intact.  Cranial Nerve Exam:  CN 2 Normal vision without visual field defects   CN 3,4,6 EOMs intact without nystagmus, PERRLA  CN 5 Facial sensation is symmetrical.  CN 7 Spontaneous facial expressions are symmetrical.  CN 8 Auditory acuity intact bilaterally to casual conversation.  CN 9 Palate rises with phonation.  CN 11 Trapezius and sternocleidomastoids intact.  CN 12 Tongue protrudes in midline.  Motor: Power is 5/5 right extremities, 4+/5 with give-way quality LUE.  Normal tone and bulk for age. No drift, dystonia or tremors appreciated.   Sensory:  Sensation decreased on left side compared to right.   Coordination: Rapid alternating movements, brisk, coordinated and symmetric.   DTRs: 2+ and symmetric bilaterally  Gait: not tested.      HEENT:  Head is midline, normocephalic, atraumatic.   Cardiac:  Tachycardic  Chest:  Breath sounds CTA bilaterally.  Abd:  Bowel sounds normoactive in all quadrants.    Extremities:  No deformities, cyanosis, clubbing or edema. No new rash or bruise.  Data Review   CXR personal review -   EKG personal review -  Reviewed in Chart -  Echo  CT    Labs:  Results     Procedure Component Value Units Date/Time    Dextrose Stick Glucose [[id number]]  (Abnormal) Collected:  03/08/17 0730    Specimen:  Blood Updated:  03/08/17 0747     Glucose, POCT 235 (H) mg/dL     Dextrose Stick Glucose [[id number]]  (Abnormal) Collected:  03/08/17 0337    Specimen:  Blood Updated:  03/08/17 0353     Glucose, POCT 265 (H) mg/dL     Dextrose Stick Glucose [[id number]]  (Abnormal) Collected:  03/07/17 2113    Specimen:  Blood Updated:  03/07/17 2129     Glucose, POCT 248 (H) mg/dL     Vitamin Y86 and Folate [[id number]]  (Abnormal) Collected:  03/07/17 1547    Specimen:  Plasma Updated:  03/07/17 1730     Folate 10.6 ng/mL      Vitamin B-12 1,487 (H) pg/mL     Troponin I [[id number]] Collected:  03/07/17 1547    Specimen:  Plasma Updated:  03/07/17 1701     Troponin I <0.01 ng/mL     Dextrose Stick Glucose [[id number]]  (Abnormal) Collected:  03/07/17 1641    Specimen:  Blood Updated:  03/07/17 1659     Glucose, POCT 244 (H) mg/dL     Aspirin Resistance [[id number]]  (Abnormal) Collected:  03/07/17 1547    Specimen:  Blood Updated:  03/07/17 1645  Aspirin Resistance 593 (H) ARU     Sedimentation rate (ESR) [[id number]] Collected:  03/07/17 1547    Specimen:  Blood Updated:  03/07/17 1637     Sed Rate 2 mm/hr     C-Reactive Protein [[id number]]  (Abnormal) Collected:  03/07/17 1135    Specimen:  Plasma Updated:  03/07/17 1304     C-Reactive Protein <0.02 (L) mg/dL     T4, Free [[id number]] Collected:  03/07/17 1135    Specimen:  Plasma Updated:  03/07/17 1300     T4 Free 1.08 ng/dL     TSH [[id number]]  (Abnormal) Collected:  03/07/17 1135     Specimen:  Plasma Updated:  03/07/17 1300     TSH 0.36 (L) uIU/mL     Troponin I [[id number]] Collected:  03/07/17 1135    Specimen:  Plasma Updated:  03/07/17 1214     Troponin I 0.00 ng/mL     Dextrose Stick Glucose [[id number]]  (Abnormal) Collected:  03/07/17 1143    Specimen:  Blood Updated:  03/07/17 1159     Glucose, POCT 224 (H) mg/dL     Dextrose Stick Glucose [[id number]]  (Abnormal) Collected:  03/07/17 0849    Specimen:  Blood Updated:  03/07/17 0911     Glucose, POCT 153 (H) mg/dL             Radiology Reports  Mri Angiogram Head Without Contrast    Result Date: 03/08/2017  MRA of the head without contrast that is normal. ReadingStation:[email or web address]        Significant Tests:  See full reports for all details       Assessment & Plan   >17.5 min of 35 min unit/floor time  [name], 61 year old male being evaluated for new left facial numbness. Also with left sided extremity weakness and numbness which is reportedly his baseline. Exam shows minimal if any objective deficits.  MRI with no evidence of new infarct.  MRA results pending.     1. Diagnostics:  1. MRI brain no evidence of stroke.   2. MRA head/neck reading pending  3. ECHO unremarkable  2. Labs:   1. Stroke labs.   1. LDL 26  2. Hgba1c 9.5, continuing to trend down.  3. Aspirin resistance 593 (abn high)    3. Medications:   1. Patient currently on aspirin 81mg . Transition to clopidogrel based on aspirin resistance.  2. Patient on high dose statin.   4. Rehabilitation services:   1. PT to evaluate and treat.

## 2017-03-08 NOTE — Progress Notes (Signed)
Discharge orders written. Monitor off pt. Right arm int d/c'd, bandaide on site, no complications noted. Pt. given and verified understanding of discharge and medication education Pt. has all belongings, discharge paperwork in possession

## 2017-03-08 NOTE — Progress Notes (Signed)
Daily assessment completed this ma. Pt. Up in bed. Call bell in reach of pt./

## 2017-03-10 NOTE — UM Notes (Signed)
Utilization Management [place] NOTE      Michael [name], Michael [name]  [id number]     DOB: 01/25/1956  AUTH: [id number]    DISCHARGED  03/08/2017  4:45 PM  HOME WITH HOME HEALTH    Paulette Lynch L. Danella Penton RN BSN  [facility]  Utilization Review  [phone]  Fax [phone]

## 2017-03-10 NOTE — Discharge Summary (Signed)
[facility]      Patient: Michael [name]  Admission Date: 03/05/2017   DOB: 23-Jan-1956  Discharge Date: 03/08/2017    MRN: [id number]  Discharge Attending:Clabe Seal     Referring Physician: Clifton James, PA  PCP: Clifton James, PA       DISCHARGE SUMMARY     Discharge Information   Discharge Diagnosis:                   1. Acute on chronic left-sided weakness and numbness including face.  Told to be secondary to TIA versus atypical migraine  MRI/MRA studies--NO new CVA      PT/OT--eval done   SLP evaluation--did ok  2-D echo with bubble study--doesn't show any cardiac source .Marland Kitchen  Ef Normal  Aspirin resistance --stopped asa and started Plavix  Continue  Lipitor  No aifib on  telemetry  Neurology consulted here    2. Chest pressure in the context of coronary artery disease status post angioplasty and stenting  --consulted cards..Dr Orvis Brill though stable    troponins negative   Did new ekg -no sig finding except tachy mild  TNI x 2 ordred  Tachycardia thought to be from anxiety or from holding beta blocker here.Marland Kitchen Resume his beta blocker.  Patient is to follow with outpatient cardiology    3. Essential hypertension--- seems to be stable continue outpatient medications      4. 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 Dyslipidemia  Resume outpatient Lipitor         Discharge Condition: Medically Stable  Consultants: Neurology and cardiology consulted  Discharged to: Home    Discharge Medications:     Medication List      START taking these medications    clopidogrel 75 mg tablet  Commonly known as:  PLAVIX  Take 1 tablet (75 mg total) by mouth daily.        CONTINUE taking these medications    acetaminophen 325 MG tablet  Commonly known as:  TYLENOL  Take 2 tablets (650 mg total) by mouth every 4 (four) hours as needed for Fever (temperature greater than 38.5 C).     atorvastatin 40 MG tablet  Commonly known as:  LIPITOR      CALCIUM-MAGNESIUM-ZINC PO     cetirizine 10 MG tablet  Commonly known as:  ZyrTEC     glipiZIDE XL 5 MG 24 hr tablet  Commonly known as:  GLUCOTROL XL     hydrOXYzine 50 MG capsule  Commonly known as:  VISTARIL  Take 1 capsule (50 mg total) by mouth nightly.     isosorbide mononitrate 30 MG 24 hr tablet  Commonly known as:  IMDUR     ketorolac 0.5 % ophthalmic solution  Commonly known as:  ACULAR     lidocaine 5 %  Commonly known as:  LIDODERM     metoprolol tartrate 50 MG tablet  Commonly known as:  LOPRESSOR     nitroglycerin 0.4 MG SL tablet  Commonly known as:  NITROSTAT     ofloxacin 0.3 % ophthalmic solution  Commonly known as:  OCUFLOX     ONE-A-DAY MENS Tabs     pantoprazole 40 MG tablet  Commonly known as:  PROTONIX     prednisoLONE acetate 1 % ophthalmic suspension  Commonly known as:  PRED FORTE     pregabalin 75 MG capsule  Commonly known as:  LYRICA  traMADol 50 MG tablet  Commonly known as:  ULTRAM     TRESIBA FLEXTOUCH 100 UNIT/ML Sopn  Generic drug:  Insulin Degludec     vitamin B-12 1000 MCG tablet  Commonly known as:  CYANOCOBALAMIN        STOP taking these medications    aspirin EC 81 MG EC tablet           Where to Get Your Medications      These medications were sent to [facility] - [place], [place] - [address]  [address] [place] [address]    Phone:  [phone]    clopidogrel 75 mg tablet             Hospital Course   Presentation History   See HPI for details.    Hospital Course (3 Days)     Patient admitted with's chests pressure as well as this left-sided neurologic symptoms. It took a while to get his MRI because of his claustrophobia as well as because of patient load hose waiting for MRI scans. Ultimately MRI done. It doesn't suggest any new stroke. Patient's symptoms or back to baseline mostly.. I will discharge him home today as they don't have any more to offer to him in patient setting. He is to follow with outpatient neurology as well as  cardiology.     Procedures/Imaging:   Mri Angiogram Head Without Contrast    Result Date: 03/08/2017  MRA of the head without contrast that is normal. ReadingStation:[email or web address]    Mri Angiogram Neck With And Without Contrast    Result Date: 03/08/2017  Normal MRA scan of the neck. ReadingStation:[email or web address]    Mri Brain Without Contrast    Result Date: 03/08/2017  Mildly abnormal MRI scan of the brain without contrast, with the findings as follows: There are a few scattered areas of subcortical and periventricular gliosis with a nonspecific appearance. These may be seen in setting of migraine or very mild chronic microvascular changes. No acute process is identified. ReadingStation:[email or web address]                   Progress Note/Physical Exam at Discharge     Subjective: Patient is feeling fine today    Vitals:    03/08/17 0357 03/08/17 0728 03/08/17 0833 03/08/17 1128   BP:  105/57 105/57 126/83   Pulse:  92 92 (!) 113   Resp:  (!) 24  20   Temp:  (!) 96.8 F (36 C)  97.7 F (36.5 C)   TempSrc:  Oral  Oral   SpO2:  96%  97%   Weight: 85.3 kg (188 lb 0.8 oz)      Height:               Upon my exam today physical exam reveals no new findings to hold discharge.       Diagnostics       Last Labs     Recent Labs  Lab 03/06/17  0932   WBC 4.9   RBC 4.89   Hemoglobin 14.2   Hematocrit 42.1   MCV 86   PLT CT 160             Microbiology Results     None           Patient Instructions   Discharge Diet: Addressed  Discharge Activity: Addressed    Follow Up Appointment:  Follow-up Information     Orest Dikes,  MD Follow up.    Specialties:  Interventional Cardiology, Cardiology, Internal Medicine  Why:  December 27 @ 11:45   Contact information:  [address]  [place] [address]  [phone]             Valorie Roosevelt, MD Follow up.    Specialties:  Epilepsy, Clinical Neurophysiology, Neurology  Why:  Contact PCP for referral to Neurology office that will work with insurance.  Contact information:  [address]  [place] [address]  [phone]             Clifton James, PA Follow up on 03/14/2017.    Specialty:  Physician Assistant  Why:  @ 11:30 a.m.  Contact information:  [address]  [place] [address]  [phone]                    Time spent examining patient, discussing with patient/family regarding hospital course, chart review, reconciling medications and discharge planning: 39 minutes.    Eliott Nine    3:43 PM 03/10/2017
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
## 2017-04-13 ENCOUNTER — Ambulatory Visit (INDEPENDENT_AMBULATORY_CARE_PROVIDER_SITE_OTHER): Payer: MEDICAID | Admitting: Cardiovascular Disease

## 2017-04-13 ENCOUNTER — Encounter (INDEPENDENT_AMBULATORY_CARE_PROVIDER_SITE_OTHER): Payer: Self-pay | Admitting: Cardiovascular Disease

## 2017-04-13 VITALS — BP 105/67 | HR 91 | Ht 68.0 in | Wt 197.0 lb

## 2017-04-13 DIAGNOSIS — G4733 Obstructive sleep apnea (adult) (pediatric): Secondary | ICD-10-CM

## 2017-04-13 DIAGNOSIS — I10 Essential (primary) hypertension: Secondary | ICD-10-CM

## 2017-04-13 DIAGNOSIS — I2510 Atherosclerotic heart disease of native coronary artery without angina pectoris: Secondary | ICD-10-CM

## 2017-04-13 DIAGNOSIS — Z6829 Body mass index (BMI) 29.0-29.9, adult: Secondary | ICD-10-CM

## 2017-04-13 DIAGNOSIS — I679 Cerebrovascular disease, unspecified: Secondary | ICD-10-CM

## 2017-04-14 ENCOUNTER — Ambulatory Visit (INDEPENDENT_AMBULATORY_CARE_PROVIDER_SITE_OTHER): Payer: Self-pay | Admitting: Cardiovascular Disease

## 2017-04-14 DIAGNOSIS — G4733 Obstructive sleep apnea (adult) (pediatric): Secondary | ICD-10-CM

## 2017-04-14 DIAGNOSIS — I679 Cerebrovascular disease, unspecified: Secondary | ICD-10-CM

## 2017-04-17 ENCOUNTER — Encounter: Payer: Self-pay | Admitting: Cardiovascular Disease

## 2017-04-17 DIAGNOSIS — I679 Cerebrovascular disease, unspecified: Secondary | ICD-10-CM

## 2017-04-19 ENCOUNTER — Telehealth (INDEPENDENT_AMBULATORY_CARE_PROVIDER_SITE_OTHER): Payer: Self-pay | Admitting: Cardiovascular Disease

## 2017-04-19 NOTE — Telephone Encounter (Signed)
PT IS SCHEDULE TO SEE DR Jacqlyn Larsen 05-08-17 @ 10:30

## 2017-04-24 ENCOUNTER — Ambulatory Visit
Admission: RE | Admit: 2017-04-24 | Discharge: 2017-04-24 | Disposition: A | Discharge status: Home / Self Care | Payer: 59 | Source: Ambulatory Visit | Attending: Cardiovascular Disease | Admitting: Cardiovascular Disease

## 2017-04-24 DIAGNOSIS — I679 Cerebrovascular disease, unspecified: Secondary | ICD-10-CM | POA: Insufficient documentation

## 2017-04-24 DIAGNOSIS — I493 Ventricular premature depolarization: Secondary | ICD-10-CM

## 2017-05-16 ENCOUNTER — Ambulatory Visit (RURAL_HEALTH_CENTER): Payer: Self-pay | Admitting: Primary Podiatric Medicine

## 2017-05-30 HISTORY — PX: RELEASE, CUBITAL TUNNEL: SHX5074

## 2017-05-31 ENCOUNTER — Encounter: Payer: Self-pay | Admitting: Adult Medicine

## 2017-05-31 ENCOUNTER — Encounter: Payer: Self-pay | Admitting: Internal Medicine

## 2017-05-31 DIAGNOSIS — G4710 Hypersomnia, unspecified: Secondary | ICD-10-CM

## 2017-05-31 DIAGNOSIS — G4733 Obstructive sleep apnea (adult) (pediatric): Secondary | ICD-10-CM

## 2017-05-31 DIAGNOSIS — I10 Essential (primary) hypertension: Secondary | ICD-10-CM

## 2017-05-31 DIAGNOSIS — R0683 Snoring: Secondary | ICD-10-CM

## 2017-05-31 DIAGNOSIS — Z8673 Personal history of transient ischemic attack (TIA), and cerebral infarction without residual deficits: Secondary | ICD-10-CM

## 2017-05-31 DIAGNOSIS — R351 Nocturia: Secondary | ICD-10-CM

## 2017-06-01 ENCOUNTER — Telehealth: Payer: Self-pay | Admitting: Internal Medicine

## 2017-06-01 ENCOUNTER — Encounter (RURAL_HEALTH_CENTER): Payer: Self-pay | Admitting: Primary Podiatric Medicine

## 2017-06-01 ENCOUNTER — Ambulatory Visit: Payer: 59 | Attending: Primary Podiatric Medicine | Admitting: Primary Podiatric Medicine

## 2017-06-01 VITALS — HR 88 | Temp 98.6°F | Resp 18 | Wt 208.3 lb

## 2017-06-01 DIAGNOSIS — L03032 Cellulitis of left toe: Secondary | ICD-10-CM

## 2017-06-01 DIAGNOSIS — E1142 Type 2 diabetes mellitus with diabetic polyneuropathy: Secondary | ICD-10-CM

## 2017-06-01 NOTE — Progress Notes (Signed)
Subjective:    Patient ID: Michael [name] is a 62 y.o. male.    Patient's a with a history of an diabetes and CAD and a TIA who seen with complaints of a drainage and irritation over this left medial great toenail on. Patient relates that a few days ago he squeezes site and had some pus come out of this and nail fold on. Patient and numbness in appointment due to a scheduling issues on. Patient recent surgery on the cell left elbow on. Patient's last A1c was in the nines in November but has been coming down recently on.        The following portions of the patient's history were reviewed and updated as appropriate: allergies, current medications, past family history, past medical history, past social history, past surgical history and problem list.    Review of Systems   Constitutional: Negative.    Skin:        Paronychia left great toe   Neurological: Positive for numbness.   All other systems reviewed and are negative.          Objective:    Physical Exam  Patient with warm dry supple skin over this lower extremity on. Patient with his left great toenail with severe incurvation at this medial aspect with thumb mild drainage from this site on. Patient with decreased the palpable pulses without ischemic changes on. Patient with protective sensation decrease this lower extremity with numbness throughout some. Denies acute pain on palpation or callous on.      Assessment:       1. Paronychia of great toe of left foot    2. Diabetic peripheral neuropathy associated with type 2 diabetes mellitus          Plan:       After consent so we did perform a sterile prep to this left lower extremity on. At this time a timeout was called which patient was identified location procedure in type or procedure were also indicated on. All presents over in agreement on. I using an English anvil and hemostat we did remove this incurvated border from this left medial great toenail on. I we flushes sterile saline and dressed with  antibiotic ointment gauze and Coban on. Advised to remove this dressing tomorrow and can soak in warm water and Epsom salts the next couple of days and to dress with an biotic ointment and a Band-Aid until there is no further drainage on. Advisable have him return to clinic in 3-4 weeks for reevaluation on.

## 2017-06-12 ENCOUNTER — Encounter (INDEPENDENT_AMBULATORY_CARE_PROVIDER_SITE_OTHER): Payer: Self-pay | Admitting: Cardiovascular Disease
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
## 2017-06-13 ENCOUNTER — Ambulatory Visit (INDEPENDENT_AMBULATORY_CARE_PROVIDER_SITE_OTHER): Payer: MEDICAID | Admitting: Cardiovascular Disease

## 2017-06-13 ENCOUNTER — Encounter (INDEPENDENT_AMBULATORY_CARE_PROVIDER_SITE_OTHER): Payer: Self-pay | Admitting: Cardiovascular Disease

## 2017-06-13 VITALS — BP 110/74 | HR 71 | Ht 68.0 in | Wt 194.0 lb

## 2017-06-13 DIAGNOSIS — I10 Essential (primary) hypertension: Secondary | ICD-10-CM

## 2017-06-13 DIAGNOSIS — G4733 Obstructive sleep apnea (adult) (pediatric): Secondary | ICD-10-CM

## 2017-06-13 DIAGNOSIS — I679 Cerebrovascular disease, unspecified: Secondary | ICD-10-CM

## 2017-06-13 DIAGNOSIS — I2510 Atherosclerotic heart disease of native coronary artery without angina pectoris: Secondary | ICD-10-CM

## 2017-06-13 MED ORDER — CLOPIDOGREL 75 MG TABLET
75.0000 mg | ORAL_TABLET | Freq: Every day | ORAL | 3 refills | Status: DC
Start: 2017-06-13 — End: 2018-05-09

## 2017-06-15 ENCOUNTER — Ambulatory Visit
Admission: RE | Admit: 2017-06-15 | Discharge: 2017-06-15 | Disposition: A | Discharge status: Home / Self Care | Payer: 59 | Source: Ambulatory Visit | Attending: Adult Medicine | Admitting: Adult Medicine

## 2017-06-15 DIAGNOSIS — R0683 Snoring: Secondary | ICD-10-CM

## 2017-06-15 DIAGNOSIS — G4733 Obstructive sleep apnea (adult) (pediatric): Secondary | ICD-10-CM

## 2017-06-15 DIAGNOSIS — G4710 Hypersomnia, unspecified: Secondary | ICD-10-CM | POA: Insufficient documentation

## 2017-06-16 NOTE — Progress Notes (Signed)
Splitnight completed at [facility]. Brunetta Genera, rpsgt

## 2017-06-22 NOTE — Procedures (Signed)
FINDINGS: Total recording time is 454 minutes.  Total sleep time  is 353 minutes.  Sleep onset is normal at 7 minutes.  Sleep  efficiency is reduced.  There is normal stage 1 sleep.  There is  increased stage 2 sleep.  Slow-wave sleep is severely reduced at  0.3%.  REM sleep is moderately reduced at 12%.  REM onset is  minimally prolonged at 131 minutes.  The patient slept  predominantly supine.  No obvious cardiac abnormality seen.  During the initial portion of the exam, the patient slept for  130 out of possible 160 minutes.  3.9 minutes for less than 89%  with minimum sat of 84%.  Periodic limb movements were seen but  were not associated with arousals.  135 respiratory events were  seen.  The AHI and RDI are both 62.  No central events seen.  Because of the severe sleep apnea, this was changed over to a  split night study.  CPAP was applied using a full face medium  AirFit mask.  For the therapeutic portion of the exam, the  patient slept for 222 out of possible 294 minutes.  CPAP was  started at 4 and titrated up to a level of 8.  At a CPAP of 8,  the patient slept for 41 minutes and the RDI was zero.    IMPRESSION: Severe sleep apnea.  Normalizing with a continuous  positive airway pressure of 8.    RECOMMENDATION: Recommendation is for a CPAP of 8.        [id number]  DD: 06/22/2017 14:02:11  DT: 06/22/2017 14:29:37  JOB: [id number]

## 2017-06-27 ENCOUNTER — Encounter (RURAL_HEALTH_CENTER): Payer: Self-pay | Admitting: Primary Podiatric Medicine

## 2017-06-27 ENCOUNTER — Ambulatory Visit: Payer: 59 | Attending: Primary Podiatric Medicine | Admitting: Primary Podiatric Medicine

## 2017-06-27 VITALS — HR 88 | Temp 99.1°F | Resp 18 | Wt 201.0 lb

## 2017-06-27 DIAGNOSIS — Z9889 Other specified postprocedural states: Secondary | ICD-10-CM

## 2017-06-27 DIAGNOSIS — E1142 Type 2 diabetes mellitus with diabetic polyneuropathy: Secondary | ICD-10-CM

## 2017-06-29 ENCOUNTER — Inpatient Hospital Stay (EMERGENCY_DEPARTMENT_HOSPITAL)
Admission: EM | Admit: 2017-06-29 | Discharge: 2017-06-29 | Disposition: A | Discharge status: Home / Self Care | Payer: MEDICAID

## 2017-06-29 DIAGNOSIS — R079 Chest pain, unspecified: Secondary | ICD-10-CM

## 2017-07-03 ENCOUNTER — Encounter (INDEPENDENT_AMBULATORY_CARE_PROVIDER_SITE_OTHER): Payer: Self-pay | Admitting: Nurse Practitioner

## 2017-07-03 ENCOUNTER — Ambulatory Visit (INDEPENDENT_AMBULATORY_CARE_PROVIDER_SITE_OTHER): Payer: MEDICAID | Admitting: Nurse Practitioner

## 2017-07-03 ENCOUNTER — Encounter (RURAL_HEALTH_CENTER): Payer: Self-pay | Admitting: Primary Podiatric Medicine

## 2017-07-03 VITALS — BP 108/70 | HR 81 | Ht 68.0 in | Wt 210.0 lb

## 2017-07-03 DIAGNOSIS — I2510 Atherosclerotic heart disease of native coronary artery without angina pectoris: Secondary | ICD-10-CM

## 2017-07-03 DIAGNOSIS — R002 Palpitations: Secondary | ICD-10-CM

## 2017-07-03 DIAGNOSIS — R0789 Other chest pain: Principal | ICD-10-CM

## 2017-07-03 MED ORDER — ISOSORBIDE MONONITRATE ER 60 MG TABLET,EXTENDED RELEASE 24 HR
60.00 mg | ORAL_TABLET | Freq: Every morning | ORAL | 1 refills | Status: DC
Start: 2017-07-03 — End: 2019-12-17
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
## 2017-07-03 NOTE — Progress Notes (Signed)
Subjective:    Patient ID: Michael [name] is a 62 y.o. male.    Patient's a with a history of diabetes and CAD who returns to clinic following incision and drainage procedure over this left great toe. Patient relates he still get some tenderness from the cyst site at times some. Denies any fever chills nausea and vomiting on.        The following portions of the patient's history were reviewed and updated as appropriate: allergies, current medications, past family history, past medical history, past social history, past surgical history and problem list.    Review of Systems   Constitutional: Negative.    Cardiovascular: Negative.    Musculoskeletal: Negative.    Skin: Negative.    Neurological: Positive for numbness.   All other systems reviewed and are negative.          Objective:    Physical Exam  Patient with warm dry supple skin over this lower extremity on. Patient with this left great toenail which is stable but of the skin continues to be irritated at this border of. We did remove any the needed to be relieved on. Patient without any signs of infection at this level on. Patient with decreased O pulses today but without ischemic changes or claudication on. Patient decreased protective sensation over this lower extremity on. Patient without acute pain on palpation of the some tenderness over this left great toe.      Assessment:       1. Diabetic peripheral neuropathy associated with type 2 diabetes mellitus    2. Post-operative state          Plan:       Today we did did dispense some Betadine to continue to dress this for the next couple of days on. Advised to call or return to clinic should she get any drainage or tenderness from the cyst site on.

## 2017-07-05 ENCOUNTER — Encounter: Payer: Self-pay | Admitting: Nurse Practitioner

## 2017-07-05 DIAGNOSIS — R0789 Other chest pain: Secondary | ICD-10-CM

## 2017-07-10 ENCOUNTER — Ambulatory Visit
Admission: RE | Admit: 2017-07-10 | Discharge: 2017-07-10 | Disposition: A | Discharge status: Home / Self Care | Payer: 59 | Source: Ambulatory Visit | Attending: Nurse Practitioner | Admitting: Nurse Practitioner

## 2017-07-10 DIAGNOSIS — R0789 Other chest pain: Secondary | ICD-10-CM | POA: Insufficient documentation

## 2017-07-10 DIAGNOSIS — R079 Chest pain, unspecified: Secondary | ICD-10-CM

## 2017-07-10 DIAGNOSIS — I493 Ventricular premature depolarization: Secondary | ICD-10-CM

## 2017-07-12 ENCOUNTER — Other Ambulatory Visit (INDEPENDENT_AMBULATORY_CARE_PROVIDER_SITE_OTHER): Payer: Self-pay

## 2017-07-12 ENCOUNTER — Encounter (INDEPENDENT_AMBULATORY_CARE_PROVIDER_SITE_OTHER): Payer: Self-pay

## 2017-07-19 ENCOUNTER — Encounter (INDEPENDENT_AMBULATORY_CARE_PROVIDER_SITE_OTHER): Payer: Self-pay

## 2017-07-19 ENCOUNTER — Other Ambulatory Visit (INDEPENDENT_AMBULATORY_CARE_PROVIDER_SITE_OTHER): Payer: Self-pay

## 2017-07-25 ENCOUNTER — Inpatient Hospital Stay (INDEPENDENT_AMBULATORY_CARE_PROVIDER_SITE_OTHER)
Admission: RE | Admit: 2017-07-25 | Discharge: 2017-07-25 | Disposition: A | Discharge status: Home / Self Care | Payer: MEDICAID | Source: Ambulatory Visit

## 2017-07-25 ENCOUNTER — Ambulatory Visit (RURAL_HEALTH_CENTER): Payer: Self-pay | Admitting: Primary Podiatric Medicine

## 2017-07-25 DIAGNOSIS — R0789 Other chest pain: Secondary | ICD-10-CM

## 2017-07-25 DIAGNOSIS — I219 Acute myocardial infarction, unspecified: Secondary | ICD-10-CM

## 2017-07-25 DIAGNOSIS — I2510 Atherosclerotic heart disease of native coronary artery without angina pectoris: Secondary | ICD-10-CM

## 2017-07-25 DIAGNOSIS — R002 Palpitations: Secondary | ICD-10-CM
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
## 2017-08-01 ENCOUNTER — Ambulatory Visit (INDEPENDENT_AMBULATORY_CARE_PROVIDER_SITE_OTHER): Payer: MEDICAID | Admitting: Cardiovascular Disease

## 2017-08-01 ENCOUNTER — Encounter (INDEPENDENT_AMBULATORY_CARE_PROVIDER_SITE_OTHER): Payer: Self-pay | Admitting: Cardiovascular Disease

## 2017-08-01 VITALS — BP 131/83 | HR 94 | Ht 68.0 in | Wt 207.0 lb

## 2017-08-01 DIAGNOSIS — R002 Palpitations: Secondary | ICD-10-CM

## 2017-08-01 DIAGNOSIS — E785 Hyperlipidemia, unspecified: Secondary | ICD-10-CM

## 2017-08-01 DIAGNOSIS — M79606 Pain in leg, unspecified: Secondary | ICD-10-CM

## 2017-08-01 DIAGNOSIS — G4733 Obstructive sleep apnea (adult) (pediatric): Secondary | ICD-10-CM

## 2017-08-01 DIAGNOSIS — I10 Essential (primary) hypertension: Secondary | ICD-10-CM

## 2017-08-01 DIAGNOSIS — I2510 Atherosclerotic heart disease of native coronary artery without angina pectoris: Secondary | ICD-10-CM

## 2017-08-01 DIAGNOSIS — I679 Cerebrovascular disease, unspecified: Secondary | ICD-10-CM

## 2017-08-03 ENCOUNTER — Inpatient Hospital Stay (EMERGENCY_DEPARTMENT_HOSPITAL)
Admission: EM | Admit: 2017-08-03 | Discharge: 2017-08-03 | Disposition: A | Discharge status: Home / Self Care | Payer: MEDICAID

## 2017-08-03 DIAGNOSIS — S43491A Other sprain of right shoulder joint, initial encounter: Secondary | ICD-10-CM

## 2017-08-03 DIAGNOSIS — W1830XA Fall on same level, unspecified, initial encounter: Secondary | ICD-10-CM

## 2017-08-03 DIAGNOSIS — Y929 Unspecified place or not applicable: Secondary | ICD-10-CM

## 2017-08-03 DIAGNOSIS — S91202A Unspecified open wound of left great toe with damage to nail, initial encounter: Secondary | ICD-10-CM

## 2017-08-09 ENCOUNTER — Inpatient Hospital Stay (INDEPENDENT_AMBULATORY_CARE_PROVIDER_SITE_OTHER)
Admission: RE | Admit: 2017-08-09 | Discharge: 2017-08-09 | Disposition: A | Discharge status: Home / Self Care | Payer: MEDICAID | Source: Ambulatory Visit

## 2017-08-09 DIAGNOSIS — E785 Hyperlipidemia, unspecified: Secondary | ICD-10-CM

## 2017-08-09 DIAGNOSIS — M79606 Pain in leg, unspecified: Secondary | ICD-10-CM
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
## 2017-08-15 ENCOUNTER — Ambulatory Visit (INDEPENDENT_AMBULATORY_CARE_PROVIDER_SITE_OTHER): Payer: MEDICAID | Admitting: Cardiovascular Disease

## 2017-08-15 ENCOUNTER — Encounter (INDEPENDENT_AMBULATORY_CARE_PROVIDER_SITE_OTHER): Payer: Self-pay | Admitting: Cardiovascular Disease

## 2017-08-15 VITALS — BP 112/68 | HR 98 | Resp 18 | Ht 68.0 in | Wt 210.0 lb

## 2017-08-15 DIAGNOSIS — E119 Type 2 diabetes mellitus without complications: Secondary | ICD-10-CM

## 2017-08-15 DIAGNOSIS — E785 Hyperlipidemia, unspecified: Secondary | ICD-10-CM

## 2017-08-15 DIAGNOSIS — I2510 Atherosclerotic heart disease of native coronary artery without angina pectoris: Secondary | ICD-10-CM

## 2017-08-15 DIAGNOSIS — G4733 Obstructive sleep apnea (adult) (pediatric): Secondary | ICD-10-CM

## 2017-08-15 DIAGNOSIS — M79606 Pain in leg, unspecified: Secondary | ICD-10-CM

## 2017-08-15 DIAGNOSIS — I517 Cardiomegaly: Secondary | ICD-10-CM

## 2017-08-15 DIAGNOSIS — I679 Cerebrovascular disease, unspecified: Secondary | ICD-10-CM

## 2017-08-15 DIAGNOSIS — I10 Essential (primary) hypertension: Secondary | ICD-10-CM

## 2017-08-16 ENCOUNTER — Telehealth (HOSPITAL_BASED_OUTPATIENT_CLINIC_OR_DEPARTMENT_OTHER): Payer: Self-pay | Admitting: Cardiovascular Disease

## 2017-08-16 ENCOUNTER — Encounter (INDEPENDENT_AMBULATORY_CARE_PROVIDER_SITE_OTHER): Payer: Self-pay | Admitting: Nurse Practitioner

## 2017-08-16 NOTE — Telephone Encounter (Signed)
Date and time for Loop recorder procedure reviewed with pt and pt states no conflicts.

## 2017-08-30 ENCOUNTER — Inpatient Hospital Stay (HOSPITAL_BASED_OUTPATIENT_CLINIC_OR_DEPARTMENT_OTHER): Admit: 2017-08-30 | Discharge: 2017-08-30 | Disposition: A | Discharge status: Home / Self Care | Payer: Self-pay

## 2017-09-07 ENCOUNTER — Telehealth (HOSPITAL_BASED_OUTPATIENT_CLINIC_OR_DEPARTMENT_OTHER): Payer: Self-pay | Admitting: Cardiovascular Disease

## 2017-09-07 NOTE — Telephone Encounter (Signed)
Patient contacted regarding scheduled Loop implant on 09/14/17.  All instructions reviewed with patient, all questions answered.  Patient to arrive to registration @ 0730 on 09/14/17.  Patient provided with cath lab number should they have any additional questions.

## 2017-09-14 ENCOUNTER — Encounter (HOSPITAL_BASED_OUTPATIENT_CLINIC_OR_DEPARTMENT_OTHER): Payer: Self-pay

## 2017-09-14 ENCOUNTER — Inpatient Hospital Stay (HOSPITAL_BASED_OUTPATIENT_CLINIC_OR_DEPARTMENT_OTHER)
Admission: RE | Admit: 2017-09-14 | Discharge: 2017-09-14 | Disposition: A | Discharge status: Home / Self Care | Payer: MEDICAID | Source: Ambulatory Visit | Attending: Cardiovascular Disease | Admitting: Cardiovascular Disease

## 2017-09-14 ENCOUNTER — Ambulatory Visit (HOSPITAL_BASED_OUTPATIENT_CLINIC_OR_DEPARTMENT_OTHER): Payer: MEDICAID

## 2017-09-14 DIAGNOSIS — I119 Hypertensive heart disease without heart failure: Secondary | ICD-10-CM | POA: Insufficient documentation

## 2017-09-14 DIAGNOSIS — Z886 Allergy status to analgesic agent status: Secondary | ICD-10-CM | POA: Insufficient documentation

## 2017-09-14 DIAGNOSIS — Z885 Allergy status to narcotic agent status: Secondary | ICD-10-CM | POA: Insufficient documentation

## 2017-09-14 DIAGNOSIS — R600 Localized edema: Secondary | ICD-10-CM | POA: Insufficient documentation

## 2017-09-14 DIAGNOSIS — I25119 Atherosclerotic heart disease of native coronary artery with unspecified angina pectoris: Secondary | ICD-10-CM | POA: Insufficient documentation

## 2017-09-14 DIAGNOSIS — E119 Type 2 diabetes mellitus without complications: Secondary | ICD-10-CM | POA: Insufficient documentation

## 2017-09-14 DIAGNOSIS — Z7902 Long term (current) use of antithrombotics/antiplatelets: Secondary | ICD-10-CM | POA: Insufficient documentation

## 2017-09-14 DIAGNOSIS — Z7984 Long term (current) use of oral hypoglycemic drugs: Secondary | ICD-10-CM | POA: Insufficient documentation

## 2017-09-14 DIAGNOSIS — Z8673 Personal history of transient ischemic attack (TIA), and cerebral infarction without residual deficits: Secondary | ICD-10-CM | POA: Insufficient documentation

## 2017-09-14 DIAGNOSIS — Z79891 Long term (current) use of opiate analgesic: Secondary | ICD-10-CM | POA: Insufficient documentation

## 2017-09-14 DIAGNOSIS — I679 Cerebrovascular disease, unspecified: Secondary | ICD-10-CM

## 2017-09-14 DIAGNOSIS — Z794 Long term (current) use of insulin: Secondary | ICD-10-CM | POA: Insufficient documentation

## 2017-09-14 DIAGNOSIS — G4733 Obstructive sleep apnea (adult) (pediatric): Secondary | ICD-10-CM | POA: Insufficient documentation

## 2017-09-14 DIAGNOSIS — I252 Old myocardial infarction: Secondary | ICD-10-CM | POA: Insufficient documentation

## 2017-09-14 DIAGNOSIS — K219 Gastro-esophageal reflux disease without esophagitis: Secondary | ICD-10-CM | POA: Insufficient documentation

## 2017-09-14 DIAGNOSIS — Z888 Allergy status to other drugs, medicaments and biological substances status: Secondary | ICD-10-CM | POA: Insufficient documentation

## 2017-09-14 DIAGNOSIS — E7849 Other hyperlipidemia: Secondary | ICD-10-CM | POA: Insufficient documentation

## 2017-09-14 DIAGNOSIS — Z79899 Other long term (current) drug therapy: Secondary | ICD-10-CM | POA: Insufficient documentation

## 2017-09-14 DIAGNOSIS — Z955 Presence of coronary angioplasty implant and graft: Secondary | ICD-10-CM | POA: Insufficient documentation

## 2017-09-14 DIAGNOSIS — Z9989 Dependence on other enabling machines and devices: Secondary | ICD-10-CM | POA: Insufficient documentation

## 2017-09-14 DIAGNOSIS — Z881 Allergy status to other antibiotic agents status: Secondary | ICD-10-CM | POA: Insufficient documentation

## 2017-09-14 LAB — POC FINGERSTICK GLUCOSE - BMC/JMC (RESULTS): GLUCOSE, POC: 150 mg/dL — ABNORMAL HIGH (ref 60–100)

## 2017-09-14 MED ORDER — LIDOCAINE (PF) 10 MG/ML (1 %) INJECTION SOLUTION
2.0000 mL | INTRAMUSCULAR | Status: DC
Start: 2017-09-14 — End: 2017-09-14
  Administered 2017-09-14 (×2): 10 mL via INTRADERMAL
  Filled 2017-09-14: qty 30

## 2017-09-14 NOTE — H&P (Signed)
[facility]    Date Time: 09/14/2017 09:13  Patient Name: Edwin [name]    I have reviewed the current H&P dated 08/15/2017 and have examined the patient on the day of the procedure and no pertinent changes are noted.    The patient is here for ILR placement.    Vitals: HR:  [73-74] 74 (05/30 0745)  RR:  [16] 16 (05/30 0745)  BP: (129-136)/(83-88) 129/88 (05/30 0745)  SpO2:  [97 %-99 %] 99 % (05/30 0745)

## 2017-09-14 NOTE — Brief Op Note (Signed)
ILR Placement:    Successful placement of a [employer] Reveal Linq Implantable Loop Recorder.    No complications.

## 2017-09-14 NOTE — Discharge Summary (Signed)
[facility]  DISCHARGE SUMMARY    PATIENT NAME:  Edwin [name], Edwin [name]  MRN:  [id number]  DOB:  12-06-1955    ENCOUNTER DATE:  09/14/2017  DISCHARGE DATE:  09/14/2017    ATTENDING PHYSICIAN: Orest Dikes, MD  SERVICE: [facility]  PRIMARY CARE PHYSICIAN: Chip Boer, DO       PRIMARY DISCHARGE DIAGNOSIS: Recent h/o TIA with suspected Afib      DISCHARGE MEDICATIONS:     Current Discharge Medication List      CONTINUE these medications - NO CHANGES were made during your visit.      Details   acetaminophen 500 mg Tablet  Commonly known as:  TYLENOL   500 mg, Oral, EVERY 4 HOURS PRN  Refills:  0     atorvastatin 40 mg Tablet  Commonly known as:  LIPITOR   40 mg, Oral, EVERY EVENING  Refills:  0     clopidogrel 75 mg Tablet  Commonly known as:  PLAVIX   75 mg, Oral, DAILY  Qty:  90 Tab  Refills:  3     cyanocobalamin 1,000 mcg Tablet  Commonly known as:  VITAMIN B 12   1,000 mcg, Oral, DAILY  Refills:  0     FARXIGA 10 mg Tablet  Generic drug:  dapagliflozin   10 mg, Oral, DAILY  Refills:  0     hydrOXYzine pamoate 50 mg Capsule  Commonly known as:  VISTARIL   50 mg, Oral, 3 TIMES DAILY PRN  Refills:  0     isosorbide mononitrate 60 mg Tablet Sustained Release 24 hr  Commonly known as:  IMDUR   60 mg, Oral, EVERY MORNING  Qty:  90 Tab  Refills:  1     lidocaine 5 % Adhesive Patch, Medicated  Commonly known as:  LIDODERM   1 Patch, Transdermal, DAILY  Refills:  0     meclizine 12.5 mg Tablet  Commonly known as:  ANTIVERT   12.5 mg, Oral, 3 TIMES DAILY PRN  Qty:  15 Tab  Refills:  0     metoprolol tartrate 50 mg Tablet  Commonly known as:  LOPRESSOR   50 mg, Oral, 2 TIMES DAILY  Refills:  0     NOVOLOG FLEXPEN U-100 INSULIN SUBQ   12 Units, Subcutaneous, 3 TIMES DAILY  Refills:  0     pantoprazole 40 mg Tablet, Delayed Release (E.C.)  Commonly known as:  PROTONIX   40 mg, Oral, DAILY  Refills:  0     traMADol 50 mg Tablet  Commonly known as:  ULTRAM   1 Tab, Oral, EVERY 6 HOURS PRN  Refills:  0     TRESIBA  FLEXTOUCH U-100 100 unit/mL (3 mL) Insulin Pen  Generic drug:  insulin degludec   Subcutaneous  Refills:  0          Discharge med list refreshed?  Yes    ALLERGIES:  Allergies   Allergen Reactions   . Gabapentin    . Glucophage [Metformin]    . Keflex [Cephalexin]    . Morphine    . Tylenol Pm [Diphenhydramine-Acetaminophen]    . Victoza [Liraglutide]        HOSPITAL PROCEDURE(S): ILR placement    BRIEF HOSPITAL NARRATIVE:  This is a 62 y.o., male brought in for placement of implantable loop recorder. The procedure was done successfully. Patient tolerated the procedure well and there were no complications. Discharge to home. F/U with me at [place].  CONDITION ON DISCHARGE:  A. Ambulation: Full ambulation  B. Cognitive Status Alert and Oriented x 3  C. Code status at discharge: Full      LINES/DRAINS/WOUNDS AT DISCHARGE:   Patient Lines/Drains/Airways Status    Active Line / Dialysis Catheter / Dialysis Graft / Drain / Airway / Wound     None                DISCHARGE DISPOSITION:  Home discharge        Renata Caprice, MD      Copies sent to Care Team       Relationship Specialty Notifications Start End    Chip Boer, DO PCP - General EXTERNAL  01/28/16     Phone: [phone] Fax: [phone]         [address] [place] [address]          Referring providers can utilize [email or web address] to access their referred [facility] patient's information.

## 2017-09-14 NOTE — Nurses Notes (Signed)
Patient brought back to CCL holding room 3 and hooked up to noninvasive cardiac monitors.

## 2017-09-14 NOTE — Nurses Notes (Signed)
Summary note:  0910:  Care assumed by this RN.  Consents have been signed.  Physician has seen patient.  Patient prepped and draped, ready for procedure.  4540:  Timeout completed.  Loop implant begins.  0930:  Procedure completed, patient tolerated well.  Eric from [employer] educating patient with use of loop recorder.  VSS.  0950:  Patient changed into street clothes.  Patient denies questions.  AVS reviewed.  9811:  Patient discharged to home ambulatory.

## 2017-09-18 ENCOUNTER — Telehealth (HOSPITAL_BASED_OUTPATIENT_CLINIC_OR_DEPARTMENT_OTHER): Payer: Self-pay | Admitting: Cardiovascular Disease

## 2017-09-19 ENCOUNTER — Telehealth (HOSPITAL_BASED_OUTPATIENT_CLINIC_OR_DEPARTMENT_OTHER): Payer: Self-pay | Admitting: Cardiovascular Disease

## 2017-09-22 ENCOUNTER — Telehealth (HOSPITAL_BASED_OUTPATIENT_CLINIC_OR_DEPARTMENT_OTHER): Payer: Self-pay | Admitting: Cardiovascular Disease

## 2017-09-22 ENCOUNTER — Encounter (INDEPENDENT_AMBULATORY_CARE_PROVIDER_SITE_OTHER): Payer: Self-pay | Admitting: NEUROSURGERY

## 2017-09-22 ENCOUNTER — Ambulatory Visit (INDEPENDENT_AMBULATORY_CARE_PROVIDER_SITE_OTHER): Payer: MEDICAID | Admitting: NEUROSURGERY

## 2017-09-22 VITALS — BP 116/71 | HR 79 | Ht 68.0 in | Wt 216.0 lb

## 2017-09-22 DIAGNOSIS — Z9889 Other specified postprocedural states: Secondary | ICD-10-CM

## 2017-09-22 DIAGNOSIS — M21372 Foot drop, left foot: Secondary | ICD-10-CM

## 2017-09-22 DIAGNOSIS — M6250 Muscle wasting and atrophy, not elsewhere classified, unspecified site: Secondary | ICD-10-CM

## 2017-09-22 DIAGNOSIS — E1165 Type 2 diabetes mellitus with hyperglycemia: Secondary | ICD-10-CM

## 2017-09-22 DIAGNOSIS — Z8614 Personal history of Methicillin resistant Staphylococcus aureus infection: Secondary | ICD-10-CM

## 2017-09-22 DIAGNOSIS — E1142 Type 2 diabetes mellitus with diabetic polyneuropathy: Secondary | ICD-10-CM

## 2017-09-22 DIAGNOSIS — I519 Heart disease, unspecified: Secondary | ICD-10-CM

## 2017-09-22 DIAGNOSIS — M48061 Spinal stenosis, lumbar region without neurogenic claudication: Principal | ICD-10-CM

## 2017-09-22 DIAGNOSIS — R260 Ataxic gait: Secondary | ICD-10-CM

## 2017-09-22 NOTE — Nursing Note (Signed)
09/22/17 0900   Oswestry LBP    Questionnaire Completed Prior to Surgery   Pain Intensity  3   Personal Care 2   Lifting 4   Walking 3   Sitting 3   Standing 4   Sleeping 3   Sex Life 4   Social Life 3   Traveling 2   Total Score from all Sections 31   Disability Percentage [address] [place]
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
## 2017-10-05 ENCOUNTER — Inpatient Hospital Stay (EMERGENCY_DEPARTMENT_HOSPITAL): Admission: EM | Admit: 2017-10-05 | Payer: MEDICAID

## 2017-10-05 DIAGNOSIS — R0600 Dyspnea, unspecified: Secondary | ICD-10-CM

## 2017-10-05 DIAGNOSIS — R0602 Shortness of breath: Secondary | ICD-10-CM

## 2017-10-05 DIAGNOSIS — R739 Hyperglycemia, unspecified: Secondary | ICD-10-CM

## 2017-10-24 ENCOUNTER — Ambulatory Visit (INDEPENDENT_AMBULATORY_CARE_PROVIDER_SITE_OTHER): Payer: Self-pay | Admitting: NEUROSURGERY

## 2017-11-01 ENCOUNTER — Inpatient Hospital Stay (EMERGENCY_DEPARTMENT_HOSPITAL)
Admission: EM | Admit: 2017-11-01 | Discharge: 2017-11-01 | Disposition: A | Discharge status: Home / Self Care | Payer: MEDICAID

## 2017-11-01 DIAGNOSIS — L600 Ingrowing nail: Secondary | ICD-10-CM

## 2017-11-07 ENCOUNTER — Ambulatory Visit (INDEPENDENT_AMBULATORY_CARE_PROVIDER_SITE_OTHER): Payer: Self-pay | Admitting: NEUROSURGERY

## 2017-11-16 ENCOUNTER — Encounter (RURAL_HEALTH_CENTER): Payer: Self-pay | Admitting: Primary Podiatric Medicine

## 2017-11-16 ENCOUNTER — Ambulatory Visit: Payer: 59 | Attending: Primary Podiatric Medicine | Admitting: Primary Podiatric Medicine

## 2017-11-16 VITALS — HR 100 | Temp 98.2°F | Resp 16 | Wt 210.1 lb

## 2017-11-16 DIAGNOSIS — E1142 Type 2 diabetes mellitus with diabetic polyneuropathy: Secondary | ICD-10-CM

## 2017-11-16 DIAGNOSIS — L03031 Cellulitis of right toe: Secondary | ICD-10-CM

## 2017-11-16 NOTE — Progress Notes (Signed)
Subjective:    Patient ID: Michael [name] is a 62 y.o. male.    Patient with a history of diabetes and CAD as well as a TIA who was seen for exam and treatment of his lower extremity.  Does have left-sided weakness as well.  Patient complains of irritation and paronychia over this right great toenail with inflammation and some drainage from the site.  Patient relates this been going on for several weeks at this point.  Denies any fever chills nausea and vomiting today.        The following portions of the patient's history were reviewed and updated as appropriate: allergies, current medications, past family history, past medical history, past social history, past surgical history and problem list.    Review of Systems   Constitutional: Negative.    Respiratory: Negative.    Cardiovascular: Negative.    Musculoskeletal: Negative.    Skin: Negative.    Neurological: Positive for numbness.   All other systems reviewed and are negative.          Objective:    Physical Exam  Patient with warm dry supple skin over this lower extremity.  Patient with his right great toenail which is still inflammation and bleeding some drainage especially from this proximal aspect.  Patient with decreased pulses today without ischemic changes or claudication.  Patient with protective sensation decreased over this lower extremity with monofilament.  Patient with pain on palpation at this right great toenail today.      Assessment:       1. Diabetic peripheral neuropathy associated with type 2 diabetes mellitus    2. Paronychia of great toe of right foot          Plan:       Consent we did perform a digital block to this right great toenail utilizing 3 cc of 1% lidocaine plain.  After making sure this block was successful we did perform sterile prep and drape to this site.  At this time a timeout was called which patient was identified location procedure and type of procedure were also indicated.  All present were in agreement.  Using a  hemostat we did remove this right great toenail to relieve the pressure from the cyst site.  We did flush with a sterile saline and dressed with antibiotic ointment gauze and Covan.  Advised to remove this dressing beginning tomorrow and can soak for a couple of days in warm water and Epson salt for 15 to 20 minutes daily.  Advised to then dressed with antibiotic ointment and a Band-Aid.  Advised to call with any worsening conditions.
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
## 2017-11-23 ENCOUNTER — Ambulatory Visit (INDEPENDENT_AMBULATORY_CARE_PROVIDER_SITE_OTHER): Payer: MEDICAID | Admitting: Cardiovascular Disease

## 2017-11-23 ENCOUNTER — Encounter (INDEPENDENT_AMBULATORY_CARE_PROVIDER_SITE_OTHER): Payer: Self-pay | Admitting: Cardiovascular Disease

## 2017-11-23 VITALS — BP 104/66 | HR 90 | Resp 18 | Ht 68.0 in | Wt 212.0 lb

## 2017-11-23 DIAGNOSIS — E785 Hyperlipidemia, unspecified: Secondary | ICD-10-CM

## 2017-11-23 DIAGNOSIS — I10 Essential (primary) hypertension: Secondary | ICD-10-CM

## 2017-11-23 DIAGNOSIS — I679 Cerebrovascular disease, unspecified: Secondary | ICD-10-CM

## 2017-11-23 DIAGNOSIS — I517 Cardiomegaly: Secondary | ICD-10-CM

## 2017-11-23 DIAGNOSIS — I2510 Atherosclerotic heart disease of native coronary artery without angina pectoris: Secondary | ICD-10-CM

## 2017-11-23 DIAGNOSIS — G4733 Obstructive sleep apnea (adult) (pediatric): Secondary | ICD-10-CM

## 2017-11-23 DIAGNOSIS — E119 Type 2 diabetes mellitus without complications: Secondary | ICD-10-CM

## 2017-11-27 ENCOUNTER — Ambulatory Visit (RURAL_HEALTH_CENTER): Payer: Self-pay | Admitting: Primary Podiatric Medicine

## 2017-12-13 ENCOUNTER — Ambulatory Visit (INDEPENDENT_AMBULATORY_CARE_PROVIDER_SITE_OTHER): Payer: MEDICAID

## 2017-12-13 DIAGNOSIS — Z4509 Encounter for adjustment and management of other cardiac device: Secondary | ICD-10-CM

## 2017-12-13 DIAGNOSIS — I679 Cerebrovascular disease, unspecified: Secondary | ICD-10-CM

## 2017-12-19 ENCOUNTER — Ambulatory Visit (HOSPITAL_BASED_OUTPATIENT_CLINIC_OR_DEPARTMENT_OTHER): Payer: 59 | Admitting: Anesthesiology

## 2017-12-19 ENCOUNTER — Ambulatory Visit
Admission: RE | Admit: 2017-12-19 | Discharge: 2017-12-19 | Disposition: A | Discharge status: Home / Self Care | Payer: 59 | Attending: Gastroenterology | Admitting: Gastroenterology

## 2017-12-19 ENCOUNTER — Encounter (HOSPITAL_BASED_OUTPATIENT_CLINIC_OR_DEPARTMENT_OTHER): Payer: Self-pay

## 2017-12-19 ENCOUNTER — Encounter (HOSPITAL_BASED_OUTPATIENT_CLINIC_OR_DEPARTMENT_OTHER)
Admission: RE | Disposition: A | Discharge status: Home / Self Care | Payer: Self-pay | Source: Home / Self Care | Attending: Gastroenterology

## 2017-12-19 DIAGNOSIS — Z8673 Personal history of transient ischemic attack (TIA), and cerebral infarction without residual deficits: Secondary | ICD-10-CM | POA: Insufficient documentation

## 2017-12-19 DIAGNOSIS — E1140 Type 2 diabetes mellitus with diabetic neuropathy, unspecified: Secondary | ICD-10-CM | POA: Insufficient documentation

## 2017-12-19 DIAGNOSIS — K219 Gastro-esophageal reflux disease without esophagitis: Secondary | ICD-10-CM | POA: Insufficient documentation

## 2017-12-19 DIAGNOSIS — I2510 Atherosclerotic heart disease of native coronary artery without angina pectoris: Secondary | ICD-10-CM | POA: Insufficient documentation

## 2017-12-19 DIAGNOSIS — E785 Hyperlipidemia, unspecified: Secondary | ICD-10-CM | POA: Insufficient documentation

## 2017-12-19 DIAGNOSIS — Z955 Presence of coronary angioplasty implant and graft: Secondary | ICD-10-CM | POA: Insufficient documentation

## 2017-12-19 DIAGNOSIS — Z1211 Encounter for screening for malignant neoplasm of colon: Secondary | ICD-10-CM | POA: Insufficient documentation

## 2017-12-19 DIAGNOSIS — G4739 Other sleep apnea: Secondary | ICD-10-CM | POA: Insufficient documentation

## 2017-12-19 DIAGNOSIS — I252 Old myocardial infarction: Secondary | ICD-10-CM | POA: Insufficient documentation

## 2017-12-19 DIAGNOSIS — I10 Essential (primary) hypertension: Secondary | ICD-10-CM | POA: Insufficient documentation

## 2017-12-19 HISTORY — DX: Type 2 diabetes mellitus without complications: E11.9

## 2017-12-19 HISTORY — DX: Sleep apnea, unspecified: G47.30

## 2017-12-19 HISTORY — PX: EGD, COLONOSCOPY: SHX3799

## 2017-12-19 LAB — VH DEXTROSE STICK GLUCOSE: Glucose POCT: 182 mg/dL — ABNORMAL HIGH (ref 71–99)

## 2017-12-19 SURGERY — EGD, COLONOSCOPY
Anesthesia: Anesthesia MAC / Sedation | Site: Abdomen | Wound class: Clean Contaminated

## 2017-12-19 MED ORDER — FENTANYL CITRATE (PF) 50 MCG/ML IJ SOLN (WRAP)
INTRAMUSCULAR | Status: AC
Start: 2017-12-19 — End: ?
  Filled 2017-12-19: qty 2

## 2017-12-19 MED ORDER — SODIUM CHLORIDE 0.9 % IV SOLN
INTRAVENOUS | Status: DC
Start: 2017-12-19 — End: 2017-12-19

## 2017-12-19 MED ORDER — PROPOFOL 200 MG/20ML IV EMUL
INTRAVENOUS | Status: AC
Start: 2017-12-19 — End: ?
  Filled 2017-12-19: qty 40

## 2017-12-19 MED ORDER — ONDANSETRON HCL 4 MG/2ML IJ SOLN
4.0000 mg | INTRAMUSCULAR | Status: DC | PRN
Start: 2017-12-19 — End: 2017-12-19

## 2017-12-19 MED ORDER — ONDANSETRON 4 MG PO TBDP
4.0000 mg | ORAL_TABLET | ORAL | Status: DC | PRN
Start: 2017-12-19 — End: 2017-12-19

## 2017-12-19 MED ORDER — LIDOCAINE HCL (PF) 2 % IJ SOLN
INTRAMUSCULAR | Status: DC | PRN
Start: 2017-12-19 — End: 2017-12-19
  Administered 2017-12-19: 50 mg via INTRAVENOUS

## 2017-12-19 MED ORDER — PROPOFOL 200 MG/20ML IV EMUL
INTRAVENOUS | Status: DC | PRN
Start: 2017-12-19 — End: 2017-12-19
  Administered 2017-12-19: 30 mg via INTRAVENOUS
  Administered 2017-12-19: 20 mg via INTRAVENOUS
  Administered 2017-12-19: 30 mg via INTRAVENOUS
  Administered 2017-12-19: 50 mg via INTRAVENOUS
  Administered 2017-12-19: 30 mg via INTRAVENOUS
  Administered 2017-12-19: 20 mg via INTRAVENOUS
  Administered 2017-12-19 (×3): 30 mg via INTRAVENOUS
  Administered 2017-12-19: 80 mg via INTRAVENOUS

## 2017-12-19 MED ORDER — FENTANYL CITRATE (PF) 50 MCG/ML IJ SOLN (WRAP)
INTRAMUSCULAR | Status: DC | PRN
Start: 2017-12-19 — End: 2017-12-19
  Administered 2017-12-19: 50 ug via INTRAVENOUS

## 2017-12-19 SURGICAL SUPPLY — 44 items
BRUSH CLEANING COMBINATION (Supply) ×2
BRUSH HEDGEHOG DUAL END (Supply) ×2 IMPLANT
CATH BALLOON DILATATION 5837 (Supply) IMPLANT
CATH GLD PROBE BICAP 7FX210CM (Supply) IMPLANT
CATH GLD PROBE BICAP 7FX300CM (Supply) IMPLANT
CATH GOLD PROBE 10FR (Supply) IMPLANT
CATH GOLD PROBE INJECTION 10F (Supply) IMPLANT
CLEANER ENZYMATIC BEDSIDE (Supply) ×4 IMPLANT
CLIP RESOLUTION 360 (Supply) IMPLANT
CONNECTOR QUICK PORT (Supply) ×4 IMPLANT
CRE BALLOON 10-12 DILATOR 5835 (Supply) IMPLANT
CRE BALLOON 12-15 DILATOR 5836 (Supply) IMPLANT
CRE BALLOON 18-20 DILATOR 5838 (Supply) IMPLANT
CRE BALLOON 6-8 DILAT 5833 (Supply) IMPLANT
CRE BALLOON 8-10 DILAT 5834 (Supply) IMPLANT
CRE ESOPH PYLORIC COL 10-12 (Supply) IMPLANT
CRE ESOPH PYLORIC COL 12-15 (Supply) IMPLANT
CRE ESOPH PYLORIC COL 15-18 (Supply) IMPLANT
CRE ESOPH PYLORIC COL 18-20 (Supply) IMPLANT
CRE ESOPH PYLORIC COL 6-8 (Supply) IMPLANT
CRE ESOPH PYLORIC COL 8-10 (Supply) IMPLANT
CRE PYLORIC COL 10-12 DIL 5847 (Supply) IMPLANT
CRE PYLORIC COL 12-15 DIL 5848 (Supply) IMPLANT
CRE PYLORIC COL 15-18 DIL 5849 (Supply) IMPLANT
CRE PYLORIC COL 8-10 DIL 5846 (Supply) IMPLANT
DEVICE STERIFLATE INFLATION (Supply) IMPLANT
DIL CRE 18-20 PYL CLN 5850 (Supply) IMPLANT
ENDOCUFF VISION LG GREEN 11.2 (Supply) IMPLANT
ENDOCUFF VISION PURPLE (Supply) IMPLANT
FORCEP BIOPSY HOT RADIAL JAW 4 (Supply) IMPLANT
FORCEP BIOPSY RAD JAW 1333-40 (Supply) IMPLANT
FORCEP RADIAL JAW JUMBO 240CM (Supply) IMPLANT
MARKER ENDOSCOPIC SPOT (Supply) IMPLANT
NDL INTERJECT SCLERO 25G (Supply) IMPLANT
PAD CLINCH ENDO TRASPORT (Supply) ×4 IMPLANT
PROBE SIDE FIRE (Supply) IMPLANT
PROBE STRAIGHT FIRE APC (Supply) IMPLANT
SNARE ENDO CAP2 RND 10MM LOOP (Supply) IMPLANT
SNARE LARGE CAPTIV OVAL 6131 (Supply) IMPLANT
SNARE ROTATE SM OVAL MED STFF (Supply) IMPLANT
SNARE SMALL CAPTIV 6230 (Supply) IMPLANT
SPEEDBAND (Supply) IMPLANT
VALVE DISP CLEANING BIOGUARD (Supply) ×2 IMPLANT
VALVE OLYMPUS DISP A/W/S/ BIO (Supply) ×4 IMPLANT

## 2017-12-19 NOTE — Discharge Instr - AVS First Page (Signed)
Endoscopy Discharge Instructions    After you leave the hospital:  1. Due to the effects of the sedatives, you may feel tired for the remainder of today.   2. We recommend you go home after discharge  3. It is advisable to have supervision or access to seek help for a few hours after discharge  4. Do not drive, operate machinery, or sign important documents until tomorrow.  5. Please rest and drink extra fluids today.   6.  Avoid alcohol today.  7. Start with light easily tolerated foods advance to a regular diet as tolerated.  8. Resume your normal activities / work tomorrow    When to seek help -     Nausea / Vomiting: - try small amounts of bland foods like crackers or toast, & liquids such as soda, electrolyte replacement drinks or ice chips. Call for:  . New onset or ongoing nausea and vomiting   . The symptoms worsen - cold skin, confusion, blood or unexplainable black appearing vomit  Bleeding:  . Passing of blood or clots  or a change in stool consistency and or black in color  . Vomiting or spitting up blood  Infection:  . Redness or swelling at the IV site that continues to worsen. Initial warm compress may help.  . Fever  Pain / other:  . New onset of pain that does not subside over time or prevents you from normal activity or eating  . Shortness of breath or breathing trouble  . Weakness, feeling faint, passing out  . Swollen or distended abdomen    During business hours call your physician's office.   After-hours call [facility] [phone] and a physician will be contacted    OBSTRUCTIVE SLEEP APNEA BREATHING RISK INFORMATION  Please read!    BREATHING PRECAUTIONS AFTER YOUR SURGERY TODAY:     Sleep with your head of bed elevated at least 30 degrees extra pillows may be used   Use pain medications sparingly    Please use any breathing machines you may have at home while sleeping   A care partner is suggested for the first 24 hours    Call 911 or seek assistance for any respiratory  distress  (i.e. fast or slow breathing that is different from your normal breathing, prolonged pauses in breathing, blue tinged lips or nailbeds, shortness of breath, or altered mental status such as confusion, sleepiness, or irritability).        Dear Patient,      Thank you for choosing [facility] for your surgery.  We care about your safety during your surgery and when you return home. Today you were screened to help us decide whether you were at risk for problems that may arise with your breathing during surgery.  We used a questionnaire to look for signs of problems you may have with breathing.  The questionnaire is only a screening tool.     Obstructive Sleep Apnea (OSA) is a common breathing disorder that may occur during sleep, during surgery, or after surgery.  Patients who have OSA can sometimes be at a higher risk of breathing complications during and after surgery because of the use of medications to relieve pain.  We use these medications to help manage your discomfort during and after surgery.  These medications may sometimes cause your breathing to slow down or cause the tissues in your throat to relax.  Because these medications can cause these things to happen, we will monitor   your breathing during and after surgery while you are in the hospital.        You have a score on your screening questionnaire that indicated you may wish to have further testing in order to look for problems that may arise with your breathing at home after surgery or when you are asleep.  Please share this letter with your primary doctor and discuss whether you may need further testing, such as a sleep study.  A sleep study is the diagnostic test for OSA.  Becoming aware that you have OSA and working with your doctor to treat OSA can help keep you safe.    Sincerely,    Nicolas C. Restrepo, MD  Vice President Medical Affairs  [facility]

## 2017-12-19 NOTE — Anesthesia Preprocedure Evaluation (Signed)
Anesthesia Evaluation    AIRWAY    Mallampati: III    TM distance: >3 FB  Neck ROM: full  Mouth Opening:full   CARDIOVASCULAR    cardiovascular exam normal       DENTAL           PULMONARY    pulmonary exam normal     OTHER FINDINGS    Large beard    Vitals/Labs:    There is no height or weight on file to calculate BMI.    Temp:    HR: Heart Rate:  103  BP: BP: 143/88  RR: Resp Rate: 18  SpO2          Lab Results       Component                Value               Date                       CREAT                    0.60 (L)            11/18/2016                 BUN                      13                  04/27/2016                 NA                       133 (L)             04/27/2016                 K                        4.3                 04/27/2016                 CL                       100                 04/27/2016                 CO2                      28.8                04/27/2016             Lab Results       Component                Value               Date                       WBC                      4.9  03/06/2017                 HGB                      14.2                03/06/2017                 HCT                      42.1                03/06/2017                 MCV                      86                  03/06/2017                 PLT                      160                 03/06/2017             Lab Results       Component                Value               Date                       INR                      1.1                 11/18/2016                 INR                      1.1                 10/22/2014                 INR                      1.2                 03/18/2013                 PT                       11.0                11/18/2016                 PT                       11.7 (H)            10/22/2014                 PT                       12.0 (H)  03/18/2013              Patient  has a past medical history of CAD (coronary artery  disease); Cellulitis of finger of left hand (10/22/14); Diabetes mellitus; Gastroesophageal reflux disease; Hernia; Hyperlipidemia; Hypertension; Myocardial infarction; Neuropathy; Presence of stent in coronary artery; Sleep apnea; TIA (transient ischemic attack); and Type 2 diabetes mellitus, controlled.                Relevant Problems   No relevant active problems       PSS Anesthesia Comments: The patient reports exercise tolerance > 4 METS.  The patient denies recent history of the following cardiac symptoms: chest pain, chest pressure, shortness of breath & fatigue.    Pt seen and examined on date of surgery.  Prior to the procedure, a full discussion was held with the patient or guardian regarding risks and benefits of the anesthetic including and not limited to dental injury, sore throat, nausea, vomiting, eye injury, mouth or lip trauma. Patient or guardian voiced understanding and signed consent.     It should also be noted that due to the nature of the electronic medical record, some data may not be completely accurate due to artifact, failure of electronic medical record devices, inaccurate data entry by other staff, etc.        Anesthesia Plan    ASA 3     MAC                                 informed consent obtained                   Signed by: Gustavo Lah II 12/19/17 12:16 PM

## 2017-12-19 NOTE — Anesthesia Postprocedure Evaluation (Signed)
Anesthesia Post Evaluation    Patient: Michael [name]    Procedures performed: Procedure(s):  EGD, COLONOSCOPY    Anesthesia type: MAC    Patient location:Outpatient    Last vitals:   Vitals:    12/19/17 1305   BP: 93/48   Pulse:    Resp: 16   SpO2: 93%       Post pain: Patient not complaining of pain, continue current therapy      Mental Status:awake and alert     Respiratory Function: tolerating room air    Cardiovascular: stable    Nausea/Vomiting: patient not complaining of nausea or vomiting    Hydration Status: adequate    Post assessment: no apparent anesthetic complications, no reportable events and no evidence of recall

## 2017-12-19 NOTE — Transfer of Care (Signed)
Anesthesia Transfer of Care Note    Patient: Michael [name]    Last vitals:   Vitals:    12/19/17 1258   BP: 128/76   Pulse: 99   Resp: 20   SpO2: 95%       Oxygen: Nasal Cannula     Mental Status:sedated    Airway: Natural    Cardiovascular Status:  stable

## 2017-12-20 ENCOUNTER — Encounter (HOSPITAL_BASED_OUTPATIENT_CLINIC_OR_DEPARTMENT_OTHER): Payer: Self-pay | Admitting: Gastroenterology

## 2017-12-26 ENCOUNTER — Encounter (INDEPENDENT_AMBULATORY_CARE_PROVIDER_SITE_OTHER): Payer: Self-pay | Admitting: NEUROSURGERY

## 2017-12-26 ENCOUNTER — Ambulatory Visit (INDEPENDENT_AMBULATORY_CARE_PROVIDER_SITE_OTHER): Payer: MEDICAID | Admitting: NEUROSURGERY

## 2017-12-26 VITALS — BP 117/79 | HR 78 | Ht 68.0 in | Wt 207.0 lb

## 2017-12-26 DIAGNOSIS — M6250 Muscle wasting and atrophy, not elsewhere classified, unspecified site: Secondary | ICD-10-CM

## 2017-12-26 DIAGNOSIS — E1165 Type 2 diabetes mellitus with hyperglycemia: Secondary | ICD-10-CM

## 2017-12-26 DIAGNOSIS — M48061 Spinal stenosis, lumbar region without neurogenic claudication: Principal | ICD-10-CM

## 2017-12-26 DIAGNOSIS — R260 Ataxic gait: Secondary | ICD-10-CM

## 2017-12-26 DIAGNOSIS — E1142 Type 2 diabetes mellitus with diabetic polyneuropathy: Secondary | ICD-10-CM

## 2017-12-26 DIAGNOSIS — M21372 Foot drop, left foot: Secondary | ICD-10-CM

## 2017-12-26 DIAGNOSIS — I519 Heart disease, unspecified: Secondary | ICD-10-CM

## 2017-12-26 DIAGNOSIS — Z8614 Personal history of Methicillin resistant Staphylococcus aureus infection: Secondary | ICD-10-CM

## 2017-12-26 DIAGNOSIS — Z9889 Other specified postprocedural states: Secondary | ICD-10-CM

## 2017-12-26 DIAGNOSIS — M4810 Ankylosing hyperostosis [Forestier], site unspecified: Secondary | ICD-10-CM

## 2017-12-26 NOTE — Progress Notes (Signed)
[facility] [place], [place]  [address] [place] [address]  [phone]        Name: Edwin [name]  MRN: [id number]  Age: 62 y.o.  DOB: 1956-01-15  ENCOUNTER DATE: 12/26/2017    CHIEF COMPLAINT: Follow-up After Testing (CT & X-Rays lumbar spine @ [facility])      HPI:  Patient is a very pleasant 62 year old gentleman who initially came to me for evaluation in June of this year.  He comes from Neurology, Dr Mir, for evaluation of lumbar stenosis.  He states that 75% his pain is in his back.  He does get bilateral leg pains and weakness.  He feels a ease tripping and falling.  He has had chronic left foot weakness and foot drop.  He has been diabetic for some 25 years and is recent blood sugars have run 3-400.  He is transitioning over to insulin at this stage.  He has cardiac issues and pulmonary issues.  He had an MRI in 2010. He has had progressive weakness in his upper and lower extremities.  He had recent left carpal tunnel and left ulnar nerve release.  He comes from [place], [place], approximately 2 hours away.  He has been on chronic anticoagulation.  He feels numb from his knees down.  He has difficulty with tripping and falling.  He is having fine motor control problems his upper extremities as well.  MRI scan was performed and neurosurgery is asked to comment on his stenosis.  Describes his pain in all extremities as 8/10.  It is described as burning shooting stabbing and shock-like specially in his lower extremities into the shins and feet.  He did have a history of MRSA infection and subsequent lost his distal digit in his upper extremity.  Describes the burning pain in his shins bilaterally as a "cattle prod".  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  Activities universal E heard is movements and adjusting himself and slowing down does ease things off.  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  There is noted facet arthropathy as well.  Prior L4-S1 surgery was noted as well.  He had complications with that surgery and had to be taken back to the OR.  He has had progressive weakness in the left leg for many years and has a chronic left footdrop at this stage.  He has been a poorly-controlled diabetic and has multiple comorbidities including COPD and cardiac issues with an MI in 2010 and 4 stents.  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  He brings CT scan and x-rays for my review.  His hemoglobin A1c is now 8. He continues to have 7 to 8/10 pain.  It worsens with all activities.  He has complaints of pain in his upper lower back as well as hips knees ankles and hands.  He is using a walking stick.  He has undergone some physical therapy to increase his course strength.  He is being evaluated for an insulin pump at this stage.  In essence his symptoms are as before.    There is no problem list on file for this patient.      Past Surgical History:   Procedure Laterality Date   . 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 HAND SURGERY Left    . HX BACK SURGERY      Low Back   . HX CERVICAL SPINE SURGERY     . HX LUMBAR SPINE SURGERY  2000    in [place]    . HX STENTING (ANY)  2010    cardiac stent x 4    . NECK SURGERY           Outpatient Medications Prior to Visit:  acetaminophen (TYLENOL) 500 mg Oral Tablet Take 500 mg by mouth Every 4 hours as needed for Pain   albuterol sulfate (PROVENTIL OR VENTOLIN OR PROAIR) 90 mcg/actuation Inhalation HFA Aerosol Inhaler Take 1-2 Puffs by inhalation Every 6 hours as needed   atorvastatin (LIPITOR) 40 mg Oral Tablet Take 40 mg by mouth Every evening   calcium carbonate (CALCIUM 300 ORAL) Take 1,000 mg by mouth Once a day   cetirizine (ZYRTEC) 10 mg Oral Tablet Take 10 mg by mouth  Once a day   cholecalciferol, vitamin D3, 1,000 unit Oral Tablet Take 1,000 Units by mouth Once a day   clopidogrel (PLAVIX) 75 mg Oral Tablet Take 1 Tab (75 mg total) by mouth Once a day   cyanocobalamin (VITAMIN B 12) 1,000 mcg Oral Tablet Take 1,000 mcg by mouth Once a day   dapagliflozin (FARXIGA) 10 mg Oral Tablet Take 10 mg by mouth Once a day   fluticasone propionate (FLONASE) 50 mcg/actuation Nasal Spray, Suspension 1 Spray by Each Nostril route Once a day   hydrOXYzine pamoate (VISTARIL) 50 mg Oral Capsule Take 50 mg by mouth Three times a day as needed for Itching   insulin aspart (NOVOLOG FLEXPEN U-100 INSULIN SUBQ) 12 Units by Subcutaneous route Three times a day   insulin degludec (TRESIBA FLEXTOUCH U-100) 100 unit/mL (3 mL) Subcutaneous Insulin Pen by Subcutaneous route   isosorbide mononitrate (IMDUR) 60 mg Oral Tablet Sustained Release 24 hr Take 1 Tab (60 mg total) by mouth Every morning   lidocaine (LIDODERM) 5 % Adhesive Patch, Medicated 1 Patch by Transdermal route Once a day   magnesium oxide,aspartate,citr 400 mg magnesium Oral Capsule Take by mouth Once a day   meclizine (ANTIVERT) 12.5 mg Oral Tablet Take 1 Tab (12.5 mg total) by mouth Three times a day as needed   metoprolol (LOPRESSOR) 50 mg Oral Tablet Take 50 mg by mouth Twice daily   pantoprazole (PROTONIX) 40 mg Oral Tablet, Delayed Release (E.C.) Take 40 mg by mouth Once a day   pregabalin (LYRICA) 50 mg Oral Capsule Take 50 mg by mouth Three times a day   traMADol (ULTRAM) 50 mg Oral Tablet Take 1 Tab by mouth Every 6 hours as needed for Pain   zinc sulfate (ZINC-15 ORAL) Take by mouth Once a day     No facility-administered medications prior to visit.     Allergies   Allergen Reactions   . Gabapentin  Other Adverse Reaction (Add comment)     hallucinations     . Morphine  Other Adverse Reaction (Add comment)     Hallucinations     . Tylenol Pm [Diphenhydramine-Acetaminophen]    . Glucophage [Metformin] Nausea/ Vomiting   . Keflex  [Cephalexin] Nausea/ Vomiting   . Victoza [Liraglutide] Nausea/ Vomiting       Family Medical History:     Problem Relation (Age of Onset)    Coronary Artery Disease Mother, Father    Diabetes Mother, Sister  Heart Attack Mother, Father    Stroke Mother, Father              Social History     Socioeconomic History   . Marital status: Single     Spouse name: Not on file   . Number of children: Not on file   . Years of education: Not on file   . Highest education level: Not on file   Tobacco Use   . Smoking status: Never Smoker   . Smokeless tobacco: Never Used   Substance and Sexual Activity   . Alcohol use: No   . Drug use: No   Other Topics Concern       REVIEW OF SYSTEMS:     12 systems reviewed and all are negative with the exception of what is listed in the HPI    PHYSICAL EXAM:    BP 117/79   Pulse 78   Ht 1.727 m (5\' 8" )   Wt 93.9 kg (207 lb)   BMI 31.47 kg/m           Physical exam:  Patient is in no acute distress.  Ambulation into the room and about the room is somewhat impaired.  There is chronic wasting of his left lower extremity.  He has a fairly unsteady gait      HEENT:  Normocephalic.  Atraumatic.  No crepitus.  No nuchal tenderness.  No facial asymmetry.  Hears well bilateral with normal speech production as well.  No malocclusion of the jaw.  No TMJ pain elicited. Dentition intact.  Mucous membranes moist.  Conjunctiva clear.  No erythema or exudates.  No battle signs or raccoon's eyes.  External ear canal without lesions.  No otorrhea or rhinorrhea.    Neck:  Good carotid upstrokes.  No adenopathy.  Trachea midline.  No crepitus.  No meningeal signs.    CV:  No JVD.  No murmurs.  Regular pulse.    Pulmonary:  No shortness of breath with muscle testing.  No dyspnea with exertion or with talking.  Equal air exchange.  No dullness to percussion.  No rib dysfunction with palpation.  No chest wall deformity.  No use of accessory muscles of respiration.    Abdomen:  Soft, nontender, no  masses, no guarding or rebound.  No significant hernias.    Back:  No tenderness to percussion.  No point tenderness.  Range-of-motion appropriate.    Musculoskeletal:  Range of motion of shoulders is appropriate without crepitus or limitation.  Muscle mass is decreased throughout especially distally.  He has wasting of his intrinsics and thenar and hypothenar eminences.  He has a digit missing in the distal 4th finger on the left hand.  Hips and knees show no swelling either.  Dupuytren's contracture noted bilaterally, left greater than right.  No palpable step-offs.  No scoliotic deformities or excessive kyphosis.  No soft tissue masses.    Neurologic:  Alert oriented x4.  Speech and affect appropriate.  Speech output appropriate.  Cognition clear.  Thought processes and cadence of  speech appropriate.  Cranial nerves 2-12 appropriate.  Tongue without fasciculations.  Motor strength shows some asymmetry, distal upper lower extremities weaker than proximal, left side weaker in left lower extremity with greater than right lower extremity  Sensation impaired bilaterally again distally greater than proximally and left side worse than right.  Loss of vibration and proprioception and pinprick and light touch discrepancies noted bilaterally.  Fine motor control is slowed.  No  cerebellar dysfunction.  Significant gait ataxia exist but he ambulates without assistance.  Gets up from a seated position well.  Memory and behavior intact.  No spasticity no hypertonicity no crossed adductor     Peripheral vascular:  Warm extremities bilaterally.  Good pulses bilaterally.    Skin:  No rashes or lesions.    Lymphatics:  No adenopathy.        IMAGING:    I personally reviewed this film and radiology report, my interpretation is as follows:    CT lumbar spine from [facility] shows severe facet arthropathy and severe stenosis at the L3-4 level.  There is diffuse facet arthropathy throughout the lumbar spine.   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  There is gaseous disc at this level.  Slight scoliosis is noted.  Slight retrolisthesis of 3 on 4 noted.  Evidence of gaseous disc at the 4 5 level and the 1-2 level.  Foraminal stenosis and facet hypertrophy seen diffusely.  Postsurgical changes seen at the 4 5 and 5 1 levels with auto fusion having occurred at 5 1. Residual lateral recess stenosis noted at 4 5 and 5 1. Sagittal views show foraminal stenosis persists as did the axial views.  Some calcifications seen of the ligamentous structures and disc space structures consistent with his ankylosing.    ASSESSMENT:    ENCOUNTER DIAGNOSES     ICD-10-CM   1. Spinal stenosis of lumbar region without neurogenic claudication M48.061   2. History of lumbar laminectomy for spinal cord decompression Z98.890   3. Diabetic peripheral neuropathy (CMS HCC) E11.42   4. Muscular atrophy, unspecified site M62.50   5. Left foot drop M21.372   6. Sensory ataxic gait R26.0   7. 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 Poorly controlled diabetes mellitus (CMS HCC) E11.65   9. Cardiac disease I51.9   10. History of MRSA infection Z86.14       PLAN:    Patient has extensive issues.  He had previous surgery in 2000 and then had to have return quickly to the OR within several days.  This typically indicates a problem with that surgery and/or spinal fluid leaks.  He had left-sided pain which improved for several years but he has been having progressive weakness in that left leg now for many years.  He has a chronic left footdrop.  He is a diabetic, having been diagnosed in 1997.  Lately his blood sugars have been over 300 and range in the 400 level.  He is taking his blood sugars more frequently.  He is being pulled off is or hypoglycemics and now is converting over to insulin.  He has not seen a endocrinologist at this stage.  He has COPD as well as cardiac issues and is had 4  stents after having an MI in 2010. He has chronic muscle wasting on exam in both distal upper extremities as well as weakness in bilateral lower extremities left worse than right.  It is in multiple nerve distributions and is not in anyone defined radicular distribution.  He has a footdrop on that left with muscle atrophy proximal and distally.  He had a hamstring injury a year ago and underwent physical therapy.  He has weakness but back pain and the back pain is 70% of his problem.  Studies were reviewed him in detail.  Studies show significant loss of muscle integrity of his paraspinal muscles consistent  with sarcopenia.  He has severe facet arthropathy at the L4-5 and 5 1 levels but also at L3-4.  Postsurgical changes are seen at L4-5 and 5 1. A left-sided approach had been performed back then..  He has an auto fused disc space at 5 1 with a calcified and or bone osteophyte compressing the left side.  There is moderate to severe stenosis at the 3 4 level.  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  From my stay point I would not recommend lumbar surgery.  He is a poor surgical candidate given his cardiac, pulmonary, and metabolic issues.  He needs to get his blood sugars under control.  He has primarily back pain which is related to his degenerative arthritic issues and his loss of muscle integrity.  I will update him with a CT scan to confirm the fused 5 1 segment but also to evaluate his facet arthropathy.  We did discuss the role of pain management.  [place]5 [place] away and he can get himself establish with pain management there.  We will get a footdrop splint.  He is at high risk for falls.  He is using a walking stick/cane at all times.  He has a very ataxic gait.  While I see no signs of spasticity, a cervical MRI scan needs to be performed and his neurologist already is taken the liberty to this.  His nerve studies that were  performed by Dr.Mir are not back yet but I a.m. Sure they are going to indicate chronic disease and advanced motor sensory neuropathy given his clinical exam.  We still do not have results from these studies.  The CT scan was extensively reviewed with the patient.  Pathology relating to the patient's condition were outlined on the films and implications to the physical exam and present presenting illness were discussed at length.  Ramifications of the studies were further elucidated by use of models and other devices.  Good understanding of the meaning of the rendered diagnosis or diagnoses was obtained.  All questions were answered that were posed.  he needs to continue to get his sugars under control.  Possible decompression at the L3-4 level can occur if he gets medically tuned up.  We will see him in several months to see how he is doing but represents a challenge to say the least.Over 30 minutes was spent with the patient, of which greater than 50% time was spent face-to-face discussing the implications of the condition and diagnosis.  Models and/or diagrams were shown in the office to provide clear understanding and  course of action.  All questions posed to me were answered in full detail and the patient echoed full understanding of these answers.      Orders Placed This Encounter   . XR LUMBAR SPINE SERIES         The patient was given the opportunity to ask questions and those questions were answered to the patient's satisfaction. The patient was encouraged to call with any additional questions or concerns. Instructed patient to call back if symptoms worse.     FOLLOW UP:    Return in about 3 months (around 03/27/2018).     Portions of this note may be dictated using voice recognition software so variances in spelling, vocabulary, commands for voice recognition, and word or phase choices may be ambiguous, unclear, or unintelligible.   Not all  errors  or unclear areas are able to be  caught or corrected.   Please notify the office if any discrepancies are noted or if the meaning of any statements are on clear.    Kayren Eaves, MD

## 2018-01-10 ENCOUNTER — Other Ambulatory Visit (INDEPENDENT_AMBULATORY_CARE_PROVIDER_SITE_OTHER): Payer: Self-pay | Admitting: NEUROSURGERY

## 2018-01-10 ENCOUNTER — Inpatient Hospital Stay (HOSPITAL_BASED_OUTPATIENT_CLINIC_OR_DEPARTMENT_OTHER): Admit: 2018-01-10 | Discharge: 2018-01-10 | Disposition: A | Discharge status: Home / Self Care | Payer: Self-pay

## 2018-01-10 ENCOUNTER — Other Ambulatory Visit (HOSPITAL_BASED_OUTPATIENT_CLINIC_OR_DEPARTMENT_OTHER): Payer: Self-pay

## 2018-01-10 DIAGNOSIS — Z9889 Other specified postprocedural states: Secondary | ICD-10-CM

## 2018-01-10 DIAGNOSIS — R260 Ataxic gait: Secondary | ICD-10-CM

## 2018-01-10 DIAGNOSIS — M48061 Spinal stenosis, lumbar region without neurogenic claudication: Secondary | ICD-10-CM

## 2018-01-10 DIAGNOSIS — E1165 Type 2 diabetes mellitus with hyperglycemia: Secondary | ICD-10-CM

## 2018-01-10 DIAGNOSIS — E1142 Type 2 diabetes mellitus with diabetic polyneuropathy: Secondary | ICD-10-CM

## 2018-01-10 DIAGNOSIS — M4810 Ankylosing hyperostosis [Forestier], site unspecified: Secondary | ICD-10-CM

## 2018-01-10 DIAGNOSIS — Z8614 Personal history of Methicillin resistant Staphylococcus aureus infection: Secondary | ICD-10-CM

## 2018-01-10 DIAGNOSIS — M6250 Muscle wasting and atrophy, not elsewhere classified, unspecified site: Secondary | ICD-10-CM

## 2018-01-10 DIAGNOSIS — M21372 Foot drop, left foot: Secondary | ICD-10-CM

## 2018-01-10 DIAGNOSIS — I519 Heart disease, unspecified: Secondary | ICD-10-CM

## 2018-01-11 NOTE — Procedures (Signed)
[facility] [place]  [address] [name]  [address]  [place] [address]  [phone]       Date: 12/13/2017  Patient Name: Edwin [name]  MRN#: [id number]  DOB: 26-Oct-1955    Remote ILR Device Interrogation    Model/SN Medtronic [id number]  Implant date Sep 14, 2017  Loop Recorder    Patient's ILR was checked by remote access.     30 day analysis was performed of patient's implantable loop recorder, including analysis of programmed parameters, battery, heart rhythm, counters, any observation summaries, and alerts.  Please see programmer printout for further details (scanned document).    Battery   OK    Events/Episodes   None    Diagnostic observations   No new observations.    Follow up in 1 month, remote

## 2018-01-15 ENCOUNTER — Ambulatory Visit (INDEPENDENT_AMBULATORY_CARE_PROVIDER_SITE_OTHER): Payer: MEDICAID

## 2018-01-15 DIAGNOSIS — Z4509 Encounter for adjustment and management of other cardiac device: Secondary | ICD-10-CM

## 2018-01-15 NOTE — Procedures (Signed)
[facility] [place]  [address] [name]  [address]  [place] [address]  [phone]       Date: 01/15/2018  Patient Name: Edwin [name]  MRN#: [id number]  DOB: [birth year]    Remote ILR Device Interrogation    Model/SN Medtronic [id number]  Implant date Sep 14, 2017  Loop Recorder    Patient's ILR was checked by remote access.     30 day analysis was performed of patient's implantable loop recorder, including analysis of programmed parameters, battery, heart rhythm, counters, any observation summaries, and alerts.  Please see programmer printout for further details (scanned document).    Battery   OK    Events/Episodes   None    Diagnostic observations   No new observations.    Follow up in 1 month, remote

## 2018-02-11 DIAGNOSIS — Z95818 Presence of other cardiac implants and grafts: Secondary | ICD-10-CM

## 2018-02-12 ENCOUNTER — Ambulatory Visit (INDEPENDENT_AMBULATORY_CARE_PROVIDER_SITE_OTHER): Payer: MEDICAID

## 2018-02-12 DIAGNOSIS — Z4509 Encounter for adjustment and management of other cardiac device: Secondary | ICD-10-CM

## 2018-02-13 ENCOUNTER — Encounter (INDEPENDENT_AMBULATORY_CARE_PROVIDER_SITE_OTHER): Payer: Self-pay

## 2018-02-16 ENCOUNTER — Inpatient Hospital Stay (EMERGENCY_DEPARTMENT_HOSPITAL)
Admission: EM | Admit: 2018-02-16 | Discharge: 2018-02-16 | Disposition: A | Discharge status: Home / Self Care | Payer: MEDICAID | Source: Other Acute Inpatient Hospital

## 2018-02-16 ENCOUNTER — Inpatient Hospital Stay
Admission: EM | Admit: 2018-02-16 | Discharge: 2018-02-17 | DRG: 313 | Disposition: A | Discharge status: Home / Self Care | Payer: 59 | Source: Other Acute Inpatient Hospital | Attending: Internal Medicine | Admitting: Internal Medicine

## 2018-02-16 DIAGNOSIS — Z7902 Long term (current) use of antithrombotics/antiplatelets: Secondary | ICD-10-CM

## 2018-02-16 DIAGNOSIS — I2510 Atherosclerotic heart disease of native coronary artery without angina pectoris: Secondary | ICD-10-CM | POA: Diagnosis present

## 2018-02-16 DIAGNOSIS — R0789 Other chest pain: Principal | ICD-10-CM | POA: Diagnosis present

## 2018-02-16 DIAGNOSIS — R079 Chest pain, unspecified: Secondary | ICD-10-CM | POA: Diagnosis present

## 2018-02-16 DIAGNOSIS — E785 Hyperlipidemia, unspecified: Secondary | ICD-10-CM | POA: Diagnosis present

## 2018-02-16 DIAGNOSIS — Z8249 Family history of ischemic heart disease and other diseases of the circulatory system: Secondary | ICD-10-CM

## 2018-02-16 DIAGNOSIS — Z8673 Personal history of transient ischemic attack (TIA), and cerebral infarction without residual deficits: Secondary | ICD-10-CM

## 2018-02-16 DIAGNOSIS — E1140 Type 2 diabetes mellitus with diabetic neuropathy, unspecified: Secondary | ICD-10-CM | POA: Diagnosis present

## 2018-02-16 DIAGNOSIS — Z833 Family history of diabetes mellitus: Secondary | ICD-10-CM

## 2018-02-16 DIAGNOSIS — I252 Old myocardial infarction: Secondary | ICD-10-CM

## 2018-02-16 DIAGNOSIS — K219 Gastro-esophageal reflux disease without esophagitis: Secondary | ICD-10-CM | POA: Diagnosis present

## 2018-02-16 DIAGNOSIS — Z885 Allergy status to narcotic agent status: Secondary | ICD-10-CM

## 2018-02-16 DIAGNOSIS — G4733 Obstructive sleep apnea (adult) (pediatric): Secondary | ICD-10-CM | POA: Diagnosis present

## 2018-02-16 DIAGNOSIS — Z794 Long term (current) use of insulin: Secondary | ICD-10-CM

## 2018-02-16 DIAGNOSIS — G459 Transient cerebral ischemic attack, unspecified: Secondary | ICD-10-CM | POA: Diagnosis present

## 2018-02-16 DIAGNOSIS — Z955 Presence of coronary angioplasty implant and graft: Secondary | ICD-10-CM

## 2018-02-16 DIAGNOSIS — E119 Type 2 diabetes mellitus without complications: Secondary | ICD-10-CM | POA: Diagnosis present

## 2018-02-16 DIAGNOSIS — Z888 Allergy status to other drugs, medicaments and biological substances status: Secondary | ICD-10-CM

## 2018-02-16 DIAGNOSIS — Z823 Family history of stroke: Secondary | ICD-10-CM

## 2018-02-16 DIAGNOSIS — M4800 Spinal stenosis, site unspecified: Secondary | ICD-10-CM

## 2018-02-16 DIAGNOSIS — I10 Essential (primary) hypertension: Secondary | ICD-10-CM | POA: Diagnosis present

## 2018-02-16 LAB — CBC AND DIFFERENTIAL
Basophils %: 1.1 % (ref 0.0–3.0)
Basophils Absolute: 0.1 10*3/uL (ref 0.0–0.3)
Eosinophils %: 3 % (ref 0.0–7.0)
Eosinophils Absolute: 0.2 10*3/uL (ref 0.0–0.8)
Hematocrit: 39.2 % (ref 39.0–52.5)
Hemoglobin: 14.1 gm/dL (ref 13.0–17.5)
Lymphocytes Absolute: 2 10*3/uL (ref 0.6–5.1)
Lymphocytes: 37.7 % (ref 15.0–46.0)
MCH: 31 pg (ref 28–35)
MCHC: 36 gm/dL (ref 32–36)
MCV: 86 fL (ref 80–100)
MPV: 7.2 fL (ref 6.0–10.0)
Monocytes Absolute: 0.4 10*3/uL (ref 0.1–1.7)
Monocytes: 8.5 % (ref 3.0–15.0)
Neutrophils %: 49.8 % (ref 42.0–78.0)
Neutrophils Absolute: 2.6 10*3/uL (ref 1.7–8.6)
PLT CT: 166 10*3/uL (ref 130–440)
RBC: 4.59 10*6/uL (ref 4.00–5.70)
RDW: 12.6 % (ref 11.0–14.0)
WBC: 5.2 10*3/uL (ref 4.0–11.0)

## 2018-02-16 LAB — BASIC METABOLIC PANEL
Anion Gap: 12 mMol/L (ref 7.0–18.0)
BUN / Creatinine Ratio: 20.3 Ratio (ref 10.0–30.0)
BUN: 14 mg/dL (ref 7–22)
CO2: 28 mMol/L (ref 20–30)
Calcium: 8.7 mg/dL (ref 8.5–10.5)
Chloride: 103 mMol/L (ref 98–110)
Creatinine: 0.69 mg/dL — ABNORMAL LOW (ref 0.80–1.30)
EGFR: 101 mL/min/{1.73_m2} (ref 60–150)
Glucose: 230 mg/dL — ABNORMAL HIGH (ref 71–99)
Osmolality Calculated: 285 mOsm/kg (ref 275–300)
Potassium: 4 mMol/L (ref 3.5–5.3)
Sodium: 139 mMol/L (ref 136–147)

## 2018-02-16 LAB — B-TYPE NATRIURETIC PEPTIDE: B-Natriuretic Peptide: 15.6 pg/mL (ref 0.0–100.0)

## 2018-02-16 LAB — TROPONIN I: Troponin I: <0.01 ng/mL (ref 0.00–0.02)

## 2018-02-16 LAB — VH DEXTROSE STICK GLUCOSE: Glucose POCT: 277 mg/dL — ABNORMAL HIGH (ref 71–99)

## 2018-02-16 MED ORDER — ACETAMINOPHEN 160 MG/5ML PO SOLN
650.00 mg | ORAL | Status: DC | PRN
Start: 2018-02-16 — End: 2018-02-17

## 2018-02-16 MED ORDER — INFLUENZA VAC SPLIT QUAD 0.5 ML IM SUSY
0.50 mL | PREFILLED_SYRINGE | INTRAMUSCULAR | Status: DC | PRN
Start: 2018-02-16 — End: 2018-02-17

## 2018-02-16 MED ORDER — METOPROLOL SUCCINATE ER 50 MG PO TB24
50.00 mg | ORAL_TABLET | Freq: Every day | ORAL | Status: DC
Start: 2018-02-17 — End: 2018-02-17
  Administered 2018-02-17: 09:00:00 50 mg via ORAL
  Filled 2018-02-16: qty 1

## 2018-02-16 MED ORDER — NALOXONE HCL 0.4 MG/ML IJ SOLN (WRAP)
0.40 mg | INTRAMUSCULAR | Status: DC | PRN
Start: 2018-02-16 — End: 2018-02-17

## 2018-02-16 MED ORDER — CLOPIDOGREL BISULFATE 75 MG PO TABS
75.00 mg | ORAL_TABLET | Freq: Every day | ORAL | Status: DC
Start: 2018-02-17 — End: 2018-02-17
  Administered 2018-02-17: 09:00:00 75 mg via ORAL
  Filled 2018-02-16: qty 1

## 2018-02-16 MED ORDER — ATORVASTATIN CALCIUM 40 MG PO TABS
40.00 mg | ORAL_TABLET | Freq: Every evening | ORAL | Status: DC
Start: 2018-02-16 — End: 2018-02-17
  Administered 2018-02-16: 23:00:00 40 mg via ORAL
  Filled 2018-02-16 (×2): qty 1

## 2018-02-16 MED ORDER — SODIUM CHLORIDE 0.9 % IJ SOLN
3.00 mL | Freq: Three times a day (TID) | INTRAMUSCULAR | Status: DC
Start: 2018-02-16 — End: 2018-02-17
  Administered 2018-02-16 – 2018-02-17 (×2): 3 mL via INTRAVENOUS

## 2018-02-16 MED ORDER — ACETAMINOPHEN 325 MG PO TABS
650.00 mg | ORAL_TABLET | ORAL | Status: DC | PRN
Start: 2018-02-16 — End: 2018-02-17
  Administered 2018-02-16: 650 mg via ORAL
  Filled 2018-02-16: qty 2

## 2018-02-16 MED ORDER — INSULIN LISPRO 100 UNIT/ML SC SOPN
15.00 [IU] | PEN_INJECTOR | Freq: Three times a day (TID) | SUBCUTANEOUS | Status: DC
Start: 2018-02-17 — End: 2018-02-17
  Administered 2018-02-17: 09:00:00 15 [IU] via SUBCUTANEOUS
  Filled 2018-02-16: qty 3

## 2018-02-16 MED ORDER — PREGABALIN 25 MG PO CAPS
50.00 mg | ORAL_CAPSULE | Freq: Three times a day (TID) | ORAL | Status: DC
Start: 2018-02-16 — End: 2018-02-17
  Administered 2018-02-16 – 2018-02-17 (×2): 50 mg via ORAL
  Filled 2018-02-16 (×2): qty 2

## 2018-02-16 MED ORDER — HYDROXYZINE PAMOATE 25 MG PO CAPS
25.00 mg | ORAL_CAPSULE | Freq: Every evening | ORAL | Status: DC
Start: 2018-02-16 — End: 2018-02-17
  Administered 2018-02-16: 23:00:00 25 mg via ORAL
  Filled 2018-02-16 (×2): qty 1

## 2018-02-16 MED ORDER — ENOXAPARIN SODIUM 40 MG/0.4ML SC SOLN
40.00 mg | SUBCUTANEOUS | Status: DC
Start: 2018-02-16 — End: 2018-02-17
  Filled 2018-02-16 (×2): qty 0.4

## 2018-02-16 MED ORDER — LANTUS SOLOSTAR 100 UNIT/ML SC SOPN
60.00 [IU] | PEN_INJECTOR | Freq: Every evening | SUBCUTANEOUS | Status: DC
Start: 2018-02-16 — End: 2018-02-17
  Administered 2018-02-16: 23:00:00 60 [IU] via SUBCUTANEOUS
  Filled 2018-02-16: qty 3

## 2018-02-16 MED ORDER — ACETAMINOPHEN 650 MG RE SUPP
650.00 mg | RECTAL | Status: DC | PRN
Start: 2018-02-16 — End: 2018-02-17

## 2018-02-16 MED ORDER — FAMOTIDINE 20 MG PO TABS
20.00 mg | ORAL_TABLET | Freq: Two times a day (BID) | ORAL | Status: DC
Start: 2018-02-16 — End: 2018-02-17
  Administered 2018-02-16 – 2018-02-17 (×2): 20 mg via ORAL
  Filled 2018-02-16 (×3): qty 1

## 2018-02-16 MED ORDER — NITROGLYCERIN 0.4 MG SL SUBL
0.40 mg | SUBLINGUAL_TABLET | SUBLINGUAL | Status: DC | PRN
Start: 2018-02-16 — End: 2018-02-17

## 2018-02-16 MED ORDER — ISOSORBIDE MONONITRATE ER 30 MG PO TB24
30.00 mg | ORAL_TABLET | Freq: Every day | ORAL | Status: DC
Start: 2018-02-17 — End: 2018-02-17
  Administered 2018-02-17: 06:00:00 30 mg via ORAL
  Filled 2018-02-16 (×2): qty 1

## 2018-02-16 NOTE — Plan of Care (Addendum)
NURSE NOTE SUMMARY  [facility] - MED TELEMETRY STEP-DOWN   Patient Name: Michael [name]   Attending Physician: Ester Rink, DO   Today's date:   02/16/2018 LOS: 0 days   Shift Summary:                                                              11/1: Received report from previous shift. Assumed care. Assessment completed. Tele in place. NPO @ midnight for Cardio consult tomorrow. Call bell in reach.    Provider Notifications:      Rapid Response Notifications:  Mobility:      PMP Activity: Step 6 - Walks in Room (02/16/2018 10:00 PM)     Weight tracking:  Family Dynamic:   Last 3 Weights for the past 72 hrs (Last 3 readings):   Weight   02/16/18 1922 99.2 kg (218 lb 11.1 oz)   02/16/18 1728 99.2 kg (218 lb 11.2 oz)             Recent Vitals Last Bowel Movement   BP: 130/69 (02/16/2018  7:22 PM)  Heart Rate: 93 (02/16/2018  7:22 PM)  Temp: 97.7 F (36.5 C) (02/16/2018  7:22 PM)  Resp Rate: 16 (02/16/2018  7:22 PM)  Height: 1.727 m (5\' 8" ) (02/16/2018  5:28 PM)  Weight: 99.2 kg (218 lb 11.1 oz) (02/16/2018  7:22 PM)  SpO2: 94 % (02/16/2018  7:22 PM)   Last BM Date: 02/15/18           Problem: Chest Pain  Goal: Vital signs and cardiac rhythm stable  Outcome: Progressing     Problem: Chest Pain  Goal: Cardiac pain management  Outcome: Progressing     Problem: Moderate/High Fall Risk Score >5  Goal: Patient will remain free of falls  Outcome: Progressing

## 2018-02-16 NOTE — H&P (Signed)
Medicine History & Physical - [facility]  [employer]   Patient Name: Michael [name], Michael [name] LOS: 0 days   Attending Physician: Ester Rink, DO PCP: Clifton James, PA      Assessment and Plan:                                                              Chest pain  H/o MI in 2000  CAD s/p 4 stents  - labs and ECG pending but normal at OSH by report  - SL nitro prn chest pain  - spoke to Dr. Orvis Brill they will see in AM  - NPO midnight for possible stress/LHC although unlikely over the weekend  - cont plavix, lipitor, imdur, metoprolol  - telemetry    IDDM  Neuropathy  - cont insulin regimen decreased to 15 units lispro with meals and 60 units lantus qhs to avoid hypoglycemia inpatient  - cont lyrica    HTN - cont home meds  HLD - cont home meds  OSA - CPAP at night  GERD - pepcid      DVT PPx: lovenox  Dispo: inpatient  Code: full code   Active Hospital Problem List  Active Problems:    Chest pain     Subjective   History of Presenting Illness                                CC: chest pain  Michael [name] is a 62 y.o. male patient h/o previous MI in 2000, CAD s/p 4 stents, IDDM, HTN, HLD, OSA, GERD followed by Dr. Howie Ill who presents as transfer from [facility] for chest pain. Pt states he has substernal sharp chest pain that radiates to his back and up his left neck. Started 1.5 hours prior to presentation at OSH. States it is somewhat relieved by rest and worse with exertion. States he had a similar pain with his last MI. Had a normal stress test earlier this year. No ECG changes and normal troponin but given his history transferred here for cardiology evaluation. Currently says his chest pain is 3/10 and is improved by nitro.    Review of Systems:    All systems were reviewed and are negative unless pertinent positive stated in HPI.  CONSTITUTIONAL: No night sweats. No fatigue. No fever or chills.   Eyes: No visual changes. No eye pain.   ENT: No runny nose. No epistaxis.    RESPIRATORY: No cough. No hemoptysis. No shortness of breath.   CARDIOVASCULAR: No chest pains. No palpitations.   GASTROINTESTINAL: No abdominal pain. No vomiting. No diarrhea or constipation.   GENITOURINARY: No urgency. No frequency. No dysuria.   MUSCULOSKELETAL: No musculoskeletal pain. No joint swelling.   NEUROLOGICAL: No headache or neck pain. No syncope or seizure.   PSYCHIATRIC: No depression, no psychosis.  SKIN: No rashes. No lesions. No petechiae.  ENDOCRINE: No unexplained weight loss. No polydipsia.   HEMATOLOGIC: No anemia. No purpura. No bleeding.   ALLERGIC AND IMMUNOLOGIC: No pruritus. No swelling      Objective   Physical Exam:     Vitals: T:97.7 F (36.5 C) (Oral),  BP:130/69, HR:93, RR:16, SaO2:94%    1) General Appearance:  Alert and oriented x 4. In no acute distress.   2) Eyes: Pink conjunctiva, anicteric sclera. Pupils are equally reactive to light.  3) ENT: Oral mucosa moist with no pharyngeal congestion, erythema or swelling.  4) Neck: Supple, with full range of motion. Trachea is central, no JVD noted  5) Chest: Clear to auscultation bilaterally, no wheezes or rhonchi.  5) CVS: normal rate and regular rhythm, with no murmurs.  6) Abdomen: Soft, non-tender, no palpable mass. Bowel sounds normal. No CVA tenderness  7) Extremities: No pitting edema, pulses palpable, no calf swelling and gross no deformity.  8) Skin: Warm, dry with normal skin turgor, no rash  9) Lymphatics: No lymphadenopathy in axillary, cervical and inguinal area.   10) Neurological: Cranial nerves II-XII intact. No gross focal motor or sensory deficits noted.  11) Psychiatric: Affect is appropriate. No hallucinations.  Patient Vitals for the past 12 hrs:   BP Temp Pulse Resp   02/16/18 1922 130/69 97.7 F (36.5 C) 93 16   02/16/18 1734 132/87 97.9 F (36.6 C) 97 16     Weight Monitoring 03/06/2017 03/07/2017 03/08/2017 06/01/2017 06/27/2017 11/16/2017 02/16/2018   Height - - - - - - 172.7 cm   Height Method - - - - - -  Stated   Weight 89.4 kg 88.3 kg 85.3 kg 94.484 kg 91.173 kg 95.301 kg 99.202 kg   Weight Method Standing Scale Standing Scale Standing Scale - - - Standing Scale   BMI (calculated) - - - - - - 33.3 kg/m2       EKG:    No results found for this or any previous visit (from the past 24 hour(s)).     Past Medical History:   Diagnosis Date   . CAD (coronary artery disease)    . Cellulitis of finger of left hand 10/22/14   . Diabetes mellitus    . Gastroesophageal reflux disease    . Hernia    . Hyperlipidemia    . Hypertension    . Myocardial infarction    . Neuropathy     diabetic   . Presence of stent in coronary artery     4 STENTS   . Sleep apnea    . TIA (transient ischemic attack)    . Type 2 diabetes mellitus, controlled       Past Surgical History:   Procedure Laterality Date   . 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 CARPAL TUNNEL RELEASE Left     05/30/17   . CERVICAL FUSION     . CIRCUMCISION, > 13 YEARS, ADULT N/A 09/08/2014    Procedure: CIRCUMCISION, > 13 YEARS, ADULT;  Surgeon: Ellwood Handler, MD;  Location: [facility];  Service: Urology;  Laterality: N/A;  CIRCUMCISION   . CORONARY ANGIOPLASTY WITH STENT PLACEMENT      2 stents   . CORONARY ANGIOPLASTY WITH STENT PLACEMENT  12/13/12    DES to OM1, 2 stents   . EGD, COLONOSCOPY N/A 12/19/2017    Procedure: EGD, COLONOSCOPY;  Surgeon: Salena Saner, MD;  Location: [facility];  Service: Gastroenterology;  Laterality: N/A;   . LUMBAR LAMINECTOMY     . RELEASE, CUBITAL TUNNEL Left 05/30/2017      Family History   Problem Relation Age of Onset   . Heart disease Mother    .  Stroke Mother    . Diabetes Mother    . Heart attack Mother    . Heart disease Father    . Stroke Father    . Heart attack Father    . Diabetes Sister       Social History     Tobacco Use   .  Smoking status: Never Smoker   . Smokeless tobacco: Never Used   Substance Use Topics   . Alcohol use: Yes     Comment: once a year maybe      Allergies   Allergen Reactions   . Gabapentin      "Makes crazy/hyper"   . Glucophage [Metformin Hydrochloride] Nausea And Vomiting   . Keflex [Cephalexin] Nausea And Vomiting   . Morphine      Hallucinations   . Victoza [Liraglutide] Nausea And Vomiting      No results found.   Home Medications     Med List Status:  Complete Set By: Eula Flax, RN at 02/16/2018  5:49 PM                atorvastatin (LIPITOR) 40 MG tablet     Take 40 mg by mouth nightly.     CALCIUM-MAGNESIUM-ZINC PO     Take 3 tablets by mouth daily        cetirizine (ZYRTEC) 10 MG tablet     Take 10 mg by mouth daily.     clopidogrel (PLAVIX) 75 mg tablet     Take 1 tablet (75 mg total) by mouth daily.     hydroCHLOROthiazide (MICROZIDE) 12.5 MG capsule     Take 12.5 mg by mouth daily     hydrOXYzine (VISTARIL) 25 MG capsule     Take 25 mg by mouth nightly     insulin aspart (NOVOLOG) 100 UNIT/ML injection     Inject 30 Units into the skin 3 (three) times daily with meals        insulin aspart (NOVOLOG) 100 UNIT/ML injection     Inject 35 Units into the skin nightly     Insulin Degludec (TRESIBA FLEXTOUCH) 100 UNIT/ML Solution Pen-injector     Inject 80 Unit into the skin nightly        isosorbide mononitrate (IMDUR) 30 MG 24 hr tablet     Take 30 mg by mouth every morning.         lidocaine (LIDODERM) 5 %     Place 1 patch onto the skin 2 (two) times daily Remove & Discard patch within 12 hours or as directed by MD        metoprolol succinate XL (TOPROL-XL) 50 MG 24 hr tablet     Take 50 mg by mouth daily     Multiple Vitamin (ONE-A-DAY MENS) Tab     Take 1 tablet by mouth every morning.     nitroglycerin (NITROSTAT) 0.4 MG SL tablet     Place 0.4 mg under the tongue every 5 (five) minutes as needed.     pantoprazole (PROTONIX) 40 MG tablet     Take 40 mg by mouth daily.     pregabalin (LYRICA) 75 MG  capsule     Take 50 mg by mouth 3 (three) times daily.         traMADol (ULTRAM) 50 MG tablet     Take 50 mg by mouth 3 (three) times daily as needed for Pain        vitamin B-12 (CYANOCOBALAMIN) 1000 MCG tablet  Take 1,000 mcg by mouth daily.           Flagged for Removal             Dapagliflozin Propanediol (FARXIGA) 10 MG Tab     Take by mouth.     glipiZIDE XL (GLUCOTROL XL) 5 MG 24 hr tablet     Take 5 mg by mouth every morning with breakfast.     Hydrocodone-Acetaminophen 7.5-300 MG Tab     Take by mouth every 4 (four) hours as needed.     hydrOXYzine (VISTARIL) 50 MG capsule     Take 1 capsule (50 mg total) by mouth nightly.     Patient taking differently:  Take 25 mg by mouth nightly        meclizine (ANTIVERT) 12.5 MG tablet     Take by mouth.     metoclopramide (REGLAN) 10 MG tablet     Take 10 mg by mouth 4 (four) times daily     metoprolol tartrate (LOPRESSOR) 50 MG tablet     Take 50 mg by mouth daily.             Meds given in the ED:  Medications   influenza quadrivalent-split vaccine (PF) (FLUARIX/FLULAVAL) IM injection 0.5 mL (has no administration in time range)   atorvastatin (LIPITOR) tablet 40 mg (has no administration in time range)   clopidogrel (PLAVIX) tablet 75 mg (has no administration in time range)   hydrOXYzine (VISTARIL) capsule 25 mg (has no administration in time range)   insulin lispro (HumaLOG) injection pen 15 Units (has no administration in time range)   insulin glargine (LANTUS SOLOSTAR) injection pen 60 Units (has no administration in time range)   isosorbide mononitrate (IMDUR) 24 hr tablet 30 mg (has no administration in time range)   metoprolol succinate XL (TOPROL-XL) 24 hr tablet 50 mg (has no administration in time range)   nitroglycerin (NITROSTAT) SL tablet 0.4 mg (has no administration in time range)   famotidine (PEPCID) tablet 20 mg (has no administration in time range)   pregabalin (LYRICA) capsule 50 mg (has no administration in time range)   sodium chloride  (PF) 0.9 % injection 3 mL (has no administration in time range)   naloxone (NARCAN) injection 0.4 mg (has no administration in time range)   enoxaparin (LOVENOX) syringe 40 mg (has no administration in time range)   acetaminophen (TYLENOL) tablet 650 mg (has no administration in time range)     Or   acetaminophen (TYLENOL) 160 MG/5ML oral solution 650 mg (has no administration in time range)     Or   acetaminophen (TYLENOL) suppository 650 mg (has no administration in time range)         Ester Rink, DO     02/16/18,7:25 PM   MRN: [id number]                                      CSN: [id number] DOB: 1955/05/25

## 2018-02-16 NOTE — Plan of Care (Signed)
Problem: Chest Pain  Goal: Vital signs and cardiac rhythm stable  Description  Interventions:  1. Monitor Michael [name] vital signs/cardiac rhythms  2. Monitor labs   3. Assess the need for oxygen therapy and administer as ordered  Flowsheets (Taken 02/16/2018 1753)  Vital signs and cardiac rhythm stable: Monitor /assess vital signs/cardiac rhythms; Monitor labs; Assess the need for oxygen therapy and administer as ordered

## 2018-02-17 DIAGNOSIS — E1140 Type 2 diabetes mellitus with diabetic neuropathy, unspecified: Secondary | ICD-10-CM

## 2018-02-17 DIAGNOSIS — M4800 Spinal stenosis, site unspecified: Secondary | ICD-10-CM

## 2018-02-17 DIAGNOSIS — I2510 Atherosclerotic heart disease of native coronary artery without angina pectoris: Secondary | ICD-10-CM

## 2018-02-17 DIAGNOSIS — R0789 Other chest pain: Secondary | ICD-10-CM

## 2018-02-17 DIAGNOSIS — Z955 Presence of coronary angioplasty implant and graft: Secondary | ICD-10-CM

## 2018-02-17 LAB — CBC AND DIFFERENTIAL
Basophils %: 0.5 % (ref 0.0–3.0)
Basophils Absolute: 0 10*3/uL (ref 0.0–0.3)
Eosinophils %: 3.5 % (ref 0.0–7.0)
Eosinophils Absolute: 0.2 10*3/uL (ref 0.0–0.8)
Hematocrit: 40.6 % (ref 39.0–52.5)
Hemoglobin: 14.1 gm/dL (ref 13.0–17.5)
Lymphocytes Absolute: 2.4 10*3/uL (ref 0.6–5.1)
Lymphocytes: 36.9 % (ref 15.0–46.0)
MCH: 30 pg (ref 28–35)
MCHC: 35 gm/dL (ref 32–36)
MCV: 86 fL (ref 80–100)
MPV: 7.5 fL (ref 6.0–10.0)
Monocytes Absolute: 0.5 10*3/uL (ref 0.1–1.7)
Monocytes: 7.1 % (ref 3.0–15.0)
Neutrophils %: 52.2 % (ref 42.0–78.0)
Neutrophils Absolute: 3.4 10*3/uL (ref 1.7–8.6)
PLT CT: 169 10*3/uL (ref 130–440)
RBC: 4.71 10*6/uL (ref 4.00–5.70)
RDW: 12.5 % (ref 11.0–14.0)
WBC: 6.4 10*3/uL (ref 4.0–11.0)

## 2018-02-17 LAB — BASIC METABOLIC PANEL
Anion Gap: 10.2 mMol/L (ref 7.0–18.0)
BUN / Creatinine Ratio: 19.2 Ratio (ref 10.0–30.0)
BUN: 14 mg/dL (ref 7–22)
CO2: 29 mMol/L (ref 20–30)
Calcium: 8.9 mg/dL (ref 8.5–10.5)
Chloride: 103 mMol/L (ref 98–110)
Creatinine: 0.73 mg/dL — ABNORMAL LOW (ref 0.80–1.30)
EGFR: 99 mL/min/{1.73_m2} (ref 60–150)
Glucose: 147 mg/dL — ABNORMAL HIGH (ref 71–99)
Osmolality Calculated: 279 mOsm/kg (ref 275–300)
Potassium: 4.2 mMol/L (ref 3.5–5.3)
Sodium: 138 mMol/L (ref 136–147)

## 2018-02-17 LAB — ECG 12-LEAD
P Wave Axis: 41 deg
P-R Interval: 196 ms
Patient Age: 62 years
Q-T Interval(Corrected): 440 ms
Q-T Interval: 359 ms
QRS Axis: 29 deg
QRS Duration: 81 ms
T Axis: 11 years
Ventricular Rate: 90 //min

## 2018-02-17 LAB — MAGNESIUM: Magnesium: 1.6 mg/dL (ref 1.6–2.6)

## 2018-02-17 LAB — VH DEXTROSE STICK GLUCOSE
Glucose POCT: 156 mg/dL — ABNORMAL HIGH (ref 71–99)
Glucose POCT: 156 mg/dL — ABNORMAL HIGH (ref 71–99)
Glucose POCT: 157 mg/dL — ABNORMAL HIGH (ref 71–99)

## 2018-02-17 LAB — TROPONIN I: Troponin I: <0.01 ng/mL (ref 0.00–0.02)

## 2018-02-17 MED ORDER — ISOSORBIDE MONONITRATE ER 120 MG PO TB24
120.00 mg | ORAL_TABLET | Freq: Every day | ORAL | 0 refills | Status: DC
Start: 2018-02-18 — End: 2018-09-07

## 2018-02-17 MED ORDER — ISOSORBIDE MONONITRATE ER 60 MG PO TB24
120.00 mg | ORAL_TABLET | Freq: Every day | ORAL | Status: DC
Start: 2018-02-18 — End: 2018-02-17
  Filled 2018-02-17: qty 2

## 2018-02-17 NOTE — Discharge Instructions (Signed)
Discharge Instructions for Angina  You have been diagnosed with a type of chest pain called angina.Angina occurs when your heart muscle doesn't get enough oxygen. It's most often felt under your breastbone, in your left shoulder, or down your left arm. The pain may even spread to your jaw or back. Exercise, increased activity, emotional upset, or stress can trigger this pain. With proper treatment and lifestyle changes to reduce risk factors, most people with angina are able to maintain a full and active life.  Managing risk factors  Your healthcare provider will work with you to make lifestyle changes as needed. This can help prevent worsening of coronary artery disease, which is likely the cause of your angina.  Coronary artery disease is a narrowing of the blood vessels that supply oxygen and nutrients to the heart muscle. The blood vessels can also spasm and reduce the oxygen reaching the heart muscle from the narrowing inside of the artery. Managing your risk factors may prevent both of these causes of narrowing of your arteries.  Diet  Your healthcare provider will give you information on dietary changes that you may need to make, based on your situation. Your provider may recommend that you see a registered dietitian for help with diet changes. Try these changes to start:   Eat less fat and cholesterol   Eat less salt (sodium), especially if you have high blood pressure   Eat more fresh vegetables and fruits   Eat lean proteins, such as fish, poultry, and legumes (beans and peas), and eat less red meat and processed meats   Use low-fat dairy products   Use vegetable and nut oils in limited amounts   Limit sweets and processed foods such as chips, cookies, and baked goods   Limit sodas and high calorie drinks   Limit greasy and fried foods, or those high in saturated fat   Limit alcohol intake  Physical activity  Your healthcare provider may recommend that you increase your physical activity  if you have not been as active as possible. This may include moderate to vigorous intensity physical activity for at least 30 to 60 minutes each day for at least 5 to 7 days per week. A few examples of moderate to vigorous intensity physical activity include:   Walking at a brisk pace, about 3 to 4 miles per hour   Jogging or running   Swimming or water aerobics   Hiking   Dancing   Martial arts   Tennis   Riding a bike  Don't start or increase your activity level without first seeing your healthcare provider.  Weight management  If you are overweight, your healthcare provider will work with you to lose weight and lower your BMI (body mass index) to a normal or near-normal level. Making diet changes and increasing physical activity can help. A healthy and reasonable goal for weight loss is to lose 10% of your current weight per year.  Smoking  If you smoke or use other tobacco products including chewing tobacco or electronic cigarettes (vaping), get help to quit. Enroll in a stop-smoking program to improve your chances of success. You can also join a support group. Talk to your healthcare provider about nicotine replacement products or medicines to help you quit.  Stress  Learn ways to manage stress to help you deal with stress in your home and work life. Your ability to prioritize your health depends on your mental health and focus. Feeling supported in the rest of your life   is key to achieving success with your health.  Managing medicines   Keep a record of your episodes of chest pain. Take these with you when you see your healthcare provider.   Take your medicines exactly as directed. Don't skip doses. If you miss a dose, call your healthcare provider right away.   If you have unwanted side effects from your medicine, tell your healthcare provider right away.    Taking nitroglycerin   Keep your nitroglycerin with you at all times.   If you're on nitroglycerin, don't take medicines used to treat  erectile dysfunction such as sildenafil or tadalafil at all. These can react with nitroglycerin and cause your blood pressure to drop to a dangerous or even life-threatening level.   If you use nitroglycerin to prevent angina attacks, follow your healthcare provider's instructions for your kind of nitroglycerin (pill, spray,or skin patch).   If you use nitroglycerin to stop an angina attack, follow these steps:  ? Sit down, because you may become dizzy.  ? Put1 tablet under your tongue, or between your lip and gum, or between your cheek and gum. Let the tablet dissolve completely. Don't chew or swallow the tablet.  ? If you use a spray, then spray once on orunder your tongue. Don't inhale. Close your mouth. Wait a few seconds before you swallow and don't rinse your mouth for5 to 10 minutes.  ? After taking1 tablet or spraying once, continue sitting for 5 minutes to make sure you feel well enough to stand up.  ? If the angina goes away completely, rest awhile and follow your provider's orders about returning to your normal routine.  ? If thechest pain or pressurecontinues, call911 right away. Don't delay. You may be having a heart attack (acute myocardial infarction, or AMI)! Don't drive yourself to the hospital if you think you are having a heart attack as this poses a risk to yourself and other drivers on the road-- take an ambulance.  ? You may be told by your provider to call911after taking 2 or 3 tables or sprays of nitroglycerin (spaced 5 minutes apart) and the chest pain or pressure is still present 5 minutes after the last dose. Don't take more than 3 tablets, or spray more than 3 times, within 15 minutes.    When to call your healthcare provider  Call your healthcare provider right away if youhave any of these:   Severe headache   Severe dizziness, or fainting   Nausea or vomiting   Fast heartbeat (higher than 100 beats per minute)   Swollen ankles   Weakness   Angina attacks that last  longer, occur more often, or are more severe than in the past   Angina that occurs at rest, wakes you up out of sleep, or does not resolve   Shortness of breath with activity or at rest  [employer] last reviewed this educational content on 09/17/2014   2000-2019 [employer] [address], [place] [address]. All rights reserved. This information is not intended as a substitute for professional medical care. Always follow your healthcare professional's instructions.

## 2018-02-17 NOTE — Consults (Signed)
[facility]  Consultation Note    Date Time: 02/17/18 8:15 AM  Patient Name: Michael [name], Michael [name]  MRN#: [id number]  DOB: January 23, 1956  Consulting Physician: Colonel Bald, MD    Reason for Consult:   Reason for consult: The patient was seen at the request of Colonel Bald, MD for the evaluation of chest pain.    History:   Michael [name] is a 62 y.o. male patient with a h/o previous MI in 2000, CAD s/p 4 stents (all remote), IDDM, HTN, HLD, OSA, spinal stenosis, neuropathy, and GERD followed by Dr. Howie Ill who presents as transfer from [name] for chest pain.  He describes sharp pain fairly localized in his lower mid chest, worse with bending over, and sharp pain in his left neck/face with associated numbness.  This occurred yesterday am while getting dressed.  It continued essentially all day and night although he was able to sleep.  This is not worse with exertion.  NTG helped some.  He has not had this before.  He does not walk that much in his daily life due to neuropathy and spinal stenosis but denies exertional CP.  No dyspnea.  Troponins negative, ECGs normal.  He has been taking all his meds.  Recent SPECT 07/2017 low risk, no ischemia.  He feels this may be due to his cervical spine disease.    Past Medical History:     Past Medical History:   Diagnosis Date   . CAD (coronary artery disease)    . Cellulitis of finger of left hand 10/22/14   . Diabetes mellitus    . Gastroesophageal reflux disease    . Hernia    . Hyperlipidemia    . Hypertension    . Myocardial infarction    . Neuropathy     diabetic   . Presence of stent in coronary artery     4 STENTS   . Sleep apnea    . TIA (transient ischemic attack)    . Type 2 diabetes mellitus, controlled        Past Surgical History:     Past Surgical History:   Procedure Laterality Date   . AMPUTATION, FINGER Left 02/26/2015    Procedure: AMPUTATION, FINGER;  Surgeon: Lujean Rave, MD;  Location: [facility];   Service: Orthopedics;  Laterality: Left;  Amputation left long finger   . CARPAL TUNNEL RELEASE Left     05/30/17   . CERVICAL FUSION     . CIRCUMCISION, > 13 YEARS, ADULT N/A 09/08/2014    Procedure: CIRCUMCISION, > 13 YEARS, ADULT;  Surgeon: Ellwood Handler, MD;  Location: [facility];  Service: Urology;  Laterality: N/A;  CIRCUMCISION   . CORONARY ANGIOPLASTY WITH STENT PLACEMENT      2 stents   . CORONARY ANGIOPLASTY WITH STENT PLACEMENT  12/13/12    DES to OM1, 2 stents   . EGD, COLONOSCOPY N/A 12/19/2017    Procedure: EGD, COLONOSCOPY;  Surgeon: Salena Saner, MD;  Location: [facility];  Service: Gastroenterology;  Laterality: N/A;   . LUMBAR LAMINECTOMY     . RELEASE, CUBITAL TUNNEL Left 05/30/2017       Problem List:   Active Problems:    Chest pain      Allergies:     Allergies   Allergen Reactions   . Gabapentin      "Makes crazy/hyper"   . Glucophage [Metformin Hydrochloride] Nausea And Vomiting   . Keflex [Cephalexin] Nausea  And Vomiting   . Morphine      Hallucinations   . Victoza [Liraglutide] Nausea And Vomiting       Medications:     Prior to Admission medications    Medication Sig Start Date End Date Taking? Authorizing Provider   atorvastatin (LIPITOR) 40 MG tablet Take 40 mg by mouth nightly.   Yes [provider]   CALCIUM-MAGNESIUM-ZINC PO Take 3 tablets by mouth daily      Yes [provider]   cetirizine (ZYRTEC) 10 MG tablet Take 10 mg by mouth daily.   Yes [provider]   clopidogrel (PLAVIX) 75 mg tablet Take 1 tablet (75 mg total) by mouth daily. 03/09/17  Yes Clabe Seal, MD   hydroCHLOROthiazide (MICROZIDE) 12.5 MG capsule Take 12.5 mg by mouth daily   Yes [provider]   hydrOXYzine (VISTARIL) 25 MG capsule Take 25 mg by mouth nightly   Yes [provider]   insulin aspart (NOVOLOG) 100 UNIT/ML injection Inject 30 Units into the skin 3 (three) times daily with meals      Yes [provider]   insulin aspart  (NOVOLOG) 100 UNIT/ML injection Inject 35 Units into the skin nightly   Yes [provider]   Insulin Degludec (TRESIBA FLEXTOUCH) 100 UNIT/ML Solution Pen-injector Inject 80 Unit into the skin nightly      Yes [provider]   isosorbide mononitrate (IMDUR) 30 MG 24 hr tablet Take 30 mg by mouth every morning.     02/28/13  Yes [provider]   lidocaine (LIDODERM) 5 % Place 1 patch onto the skin 2 (two) times daily Remove & Discard patch within 12 hours or as directed by MD      Yes [provider]   metoprolol succinate XL (TOPROL-XL) 50 MG 24 hr tablet Take 50 mg by mouth daily   Yes [provider]   Multiple Vitamin (ONE-A-DAY MENS) Tab Take 1 tablet by mouth every morning.   Yes [provider]   nitroglycerin (NITROSTAT) 0.4 MG SL tablet Place 0.4 mg under the tongue every 5 (five) minutes as needed.   Yes [provider]   pantoprazole (PROTONIX) 40 MG tablet Take 40 mg by mouth daily.   Yes [provider]   pregabalin (LYRICA) 75 MG capsule Take 50 mg by mouth 3 (three) times daily.       Yes [provider]   traMADol (ULTRAM) 50 MG tablet Take 50 mg by mouth 3 (three) times daily as needed for Pain      Yes [provider]   vitamin B-12 (CYANOCOBALAMIN) 1000 MCG tablet Take 1,000 mcg by mouth daily.   Yes [provider]   Dapagliflozin Propanediol (FARXIGA) 10 MG Tab Take by mouth.    [provider]   glipiZIDE XL (GLUCOTROL XL) 5 MG 24 hr tablet Take 5 mg by mouth every morning with breakfast.    [provider]   Hydrocodone-Acetaminophen 7.5-300 MG Tab Take by mouth every 4 (four) hours as needed.    [provider]   hydrOXYzine (VISTARIL) 50 MG capsule Take 1 capsule (50 mg total) by mouth nightly.  Patient taking differently: Take 25 mg by mouth nightly    03/19/13   Brayton Caves, MD   meclizine (ANTIVERT) 12.5 MG tablet Take by mouth. 12/13/16   [provider]   metoclopramide (REGLAN) 10 MG tablet Take 10 mg by mouth 4 (four) times daily  [provider]   metoprolol tartrate (LOPRESSOR) 50 MG tablet Take 50 mg by mouth daily.        [provider]       Family History:     Family History   Problem Relation Age of Onset   . Heart disease Mother    . Stroke Mother    . Diabetes Mother    . Heart attack Mother    . Heart disease Father    . Stroke Father    . Heart attack Father    . Diabetes Sister        Social History:     Social History     Socioeconomic History   . Marital status: Single     Spouse name: Not on file   . Number of children: Not on file   . Years of education: Not on file   . Highest education level: Not on file   Occupational History   . Not on file   Social Needs   . Financial resource strain: Not on file   . Food insecurity:     Worry: Not on file     Inability: Not on file   . Transportation needs:     Medical: Not on file     Non-medical: Not on file   Tobacco Use   . Smoking status: Never Smoker   . Smokeless tobacco: Never Used   Substance and Sexual Activity   . Alcohol use: Yes     Comment: once a year maybe   . Drug use: No   . Sexual activity: Not on file   Lifestyle   . Physical activity:     Days per week: Not on file     Minutes per session: Not on file   . Stress: Not on file   Relationships   . Social connections:     Talks on phone: Not on file     Gets together: Not on file     Attends religious service: Not on file     Active member of club or organization: Not on file     Attends meetings of clubs or organizations: Not on file     Relationship status: Not on file   . Intimate partner violence:     Fear of current or ex partner: Not on file     Emotionally abused: Not on file     Physically abused: Not on file     Forced sexual activity: Not on file   Other Topics Concern   . Not on file   Social History Narrative   . Not on file       Review of Systems:     All systems reviewed and negative other than those  noted in the HPI.    Visit Vitals  BP 120/78   Pulse 88   Temp 97.3 F (36.3 C) (Oral)   Resp 15   Ht 1.727 m (5\' 8" )   Wt 99.2 kg (218 lb 11.1 oz)   SpO2 97%   BMI 33.25 kg/m       Intake and Output Summary (Last 24 hours) at Date Time    Intake/Output Summary (Last 24 hours) at 02/17/2018 0815  Last data filed at 02/16/2018 2339  Gross per 24 hour   Intake 220 ml   Output -   Net 220 ml       General/Constitutional: no acute distress  HEENT: moist mucous membranes  Neck: supple, no JVD  Heart/Cardiovascular:   RRR  no murmurs/rubs/gallops  2+ radial pulses  Respiratory/Lungs: non-labored, clear to auscultation throughout  Abdominal/Gastrointestinal: soft, nontender  Extremities: warm and well-perfused, no edema x4  Skin: no rash  Neurological: alert and oriented x3, grossly nonfocal  Psych: calm      Labs Reviewed:   Recent CMP   Recent Labs   Lab 02/17/18  0350 02/16/18  1909   Glucose 147* 230*   BUN 14 14   Creatinine 0.73* 0.69*   Sodium 138 139   Potassium 4.2 4.0   Magnesium 1.6  --    CO2 29 28   Calcium 8.9 8.7       Recent CARDIAC ENZYMES   Recent Labs   Lab 02/17/18  0022 02/16/18  1909   Troponin I <0.01 <0.01   B-Natriuretic Peptide  --  15.6       Recent TSH       Recent PT/PTT       Recent CBC WITH DIFF   Recent Labs   Lab 02/17/18  0350 02/16/18  1909   WBC 6.4 5.2   RBC 4.71 4.59   Hemoglobin 14.1 14.1   Hematocrit 40.6 39.2   MCV 86 86   MPV 7.5 7.2   PLT CT 169 166       Recent LIPID PANEL   Cholesterol (mg/dL)   Date Value   16/01/9603 82   11/18/2016 101   04/27/2016 115    Triglycerides (mg/dL)   Date Value   54/12/8117 146   11/18/2016 253 (H)   04/27/2016 329 (H)    HDL (mg/dL)   Date Value   14/78/2956 27 (L)   11/18/2016 31 (L)   04/27/2016 27 (L)    LDL Calculated (mg/dL)   Date Value   21/30/8657 26   11/18/2016 19   04/27/2016 22        Rads:     Radiology Results (24 Hour)     ** No results found for the last 24 hours. **          Cardiovascular Workup:     LHC 10/21/11 (Dr. Duffy Rhody)  1.  Prior right coronary stent which remains patent with minimal luminal encroachment.  2. 3-vessel atherosclerosis without significant obstructive disease.  3. Normal overall systolic function with focal inferior basal hypokinesis  RECOMMENDATIONS: Medical management with vigorous secondary prevention.    Echo 10/16/12  1. The left ventricle is thickened in a fashion consistent with mild concentric hypertrophy. Global systolic function was normal. The left ventricular ejection fraction is estimated to be 55-60%.   2. Diffuse thickening (sclerosis) without reduced excursion in the aortic valve. There is evidence of trivial (trace) aortic regurgitation.   3. There is evidence of trivial (trace) mitral regurgitation. There is evidence of sclerosis of the mitral valve.   4. There is evidence of trace (trivial) tricuspid regurgitation. The estimated right ventricular systolic pressure is 18 mmHg.   5. There is no evidence of pericardial effusion.   6. There is no previous echocardiogram available for comparison.     ABI/PVR 10/16/12  1. Normal bilateral ABIs at rest and post exercise.   2. Limited PVRs at ankles and toe pressures are normal.    Nuclear stress 12/03/12  1. LV systolic function appears moderately reduced   2. Calculated LVEF 38%   3. Gated images show moderate global hypokinesis.   4. Perfusion images show a large area of ischemia  from base to distal in the infero-lateral wall with atleast moderate reversibility.  5. High risk study due to size of defect and reduced EF.    LHC 12/13/12  1. 99% proximal OM2 stenosis status post percutaneous balloon angioplasty followed by drug-eluting stent placement, Xience 3.25 x 23 mm.  2. Prior stents in RCA and proximal left circumflex noted to be patent.    Nuclear stress 03/18/13  1. Comparing the baseline and the stress images, no significant fixed or reversible perfusion defect noted.  2. Gated imaging shows normal EF.  3. Low risk Lexiscan nuclear stress test.    LHC  03/19/13  1. Patent stents in the RCA and circumflex marginal.  2. Significant 1st diagonal branch disease.  3. Normal LV systolic and diastolic function.    Nuclear stress 04/12/14  Unremarkable Lexiscan Cardiolite stress test without evidence for infarct or ischemia, and visually normal-appearing left ventricular ejection fraction.    Sleep study 08/20/15  1. Moderate sleep apnea with significant desaturations.  2. RECOMMENDATION: For the patient to undergo a full night CPAP titration for the moderate sleep apnea with desaturations.    Echo 03/07/17  1.The left ventricular cavity size is normal. The left ventricular wall thicknesses are normal. The leftventricular systolic function is normal. The left ventricular ejection fraction is normal, estimated at 55-60%.There are no regional wall motion abnormalities present. There is normal diastolic function.  2.No patent foramen ovale is demonstrated by agitated saline injection.   3.There is no significant valvular heart disease.    Event monitor 04/24/17- 05/25/17  No symptoms reports. There were strips showing sinus rhythm with PVC. There are no strips to review.     Sleep study 06/15/2017  Severe sleep apnea. Normalizing with a continuous positive airway pressure of 8.    Nuclear stress 07/25/17  Baseline EKG shows NSR with T wave abnormality. Lexiscan EKG shows no significant EKG changes from baseline.  Perfusion images show a small fixed defect of mild severity in the mid inferior wall with no clinically significant ischemia seen and normal wall motion analysis. SSS 1, SDS 0.  Ejection Fraction: 56%  Abnormal, but low risk study.  Compared to prior study report from 11/19/2016, images now do not show any ischemia.    ABI/PVR 08/09/17  Resting and post exercise ABI suggestive of no significant arterial insufficiency in b/l LE. Toe pressures adequate for healing.    30 Day Event Monitor 07/2017  I have several strips for review, which show sinus tachycardia with  PVC and sinus tachycardia.These were all auto-triggered events with no reported symptoms.There is some  baseline artifact on the strips as well.I have no other strips for review. No high-grade arrhythmias seen as detailed above.      Assessment:   CP, atypical, negative troponins/ECGs despite hours of pain, recent SPECT low risk with no ischemia  H/o CAD s/p remote PCI  Neuropathy/spinal stenosis  DM    Recommendations:     1. Continue BB, Plavix, statin  2. Will increase Imdur to 120 mg (he had been on 60 mg as outpt, denies headaches) as therapeutic trial though doubt pain is cardiac given the above evaluation  3. No plans for inpt cardiac testing at this time  4. F/u in clinic in 4 weeks    Thank you for consulting [facility] and allowing [place] to participate in this pt's care! We will follow along while the pt is in the hospital.    Seward Speck, MD,  [facility]  [address], [place]  [place], [place] [address]  Tel [phone]  Fax [phone]

## 2018-02-17 NOTE — Discharge Summary (Signed)
Medicine Discharge Summary - [facility]  [employer]   Patient Name: Michael [name]   Attending Physician: Colonel Bald, MD PCP: Clifton James, PA   Date of Admission: 02/16/2018 D/C Date: 02/17/2018   Discharge Diagnoses:     #Chest pain/ discomfort  #IDDM  #diabetic neuropathy  #HTN  #GERD  #CAD s/p PCI  #TIA  #Spinal stenosis     Hospital Course     Michael [name] is a 62 y.o. male patient h/o previous MI in 2000, CAD s/p 4 stents, IDDM, HTN, HLD, OSA, TIA, diabetic neuropathy, GERD followed by Dr. Howie Ill who presents as transfer from [facility] for chest pain. Pt states he has substernal sharp chest pain that radiates to his back and up his left neck. Started 1.5 hours prior to presentation at OSH. Also reportd sharp pain in his left neck/face with associated numbness.  This occurred yesterday am while getting dressed.  It continued essentially all day and night although he was able to sleep.  This is not worse with exertion.   States he had a similar pain with his last MI but also has some . Had a normal stress test/ SPECT 07/2017 low risk, no ischemia.  possibly caused by /neuropathy/ cervical spinal stenosis disease.  No ECG changes and normal troponin x2 but given his history transferred here for cardiology evaluation. Currently says his chest pain is 3/10 and is improved by nitro.  Has remained hemodynamically stable  Cardio assessed pt and will be increasing dose of Imdur, without any plans for inpt cardiac testing at this time. Will follow up in 4 weeks    Pt was assessed on day of [place], alert, oriented, in no acute distress, eupneic, normotenisve.  All questions and concerns were addressed    Active Problems:    CAD (coronary artery disease)    Chest pain    Type 2 diabetes mellitus    Hypertension    TIA (transient ischemic attack)    Spinal stenosis    Type 2 diabetes mellitus with diabetic neuropathy  Resolved Problems:    * No resolved hospital problems. *      Pending Results and other significant studies:     Discharge Instructions:        Disposition:  home  Diet: Cardiac Diet  Activity: As tolerated  Discharge Code Status: Full Code  Orest Dikes, MD  [address]  [place] [address]  [phone]    Schedule an appointment as soon as possible for a visit in 4 week(s)      Clifton James, [place]  [address] [address] [address]  [phone]    Schedule an appointment as soon as possible for a visit  hospital follow up with PCP     Discharge Medications:                                                                      Discharge Medication List      Taking    atorvastatin 40 MG tablet  Dose:  40 mg  Commonly known as:  LIPITOR  Take 40 mg by mouth nightly.     CALCIUM-MAGNESIUM-ZINC PO  Dose:  3 tablet  Take 3 tablets by mouth daily      cetirizine 10 MG tablet  Dose:  10 mg  Commonly known as:  ZyrTEC  Take 10 mg by mouth daily.     clopidogrel 75 mg tablet  Dose:  75 mg  Commonly known as:  PLAVIX  Take 1 tablet (75 mg total) by mouth daily.     FARXIGA 10 MG Tabs  Generic drug:  Dapagliflozin Propanediol  Take by mouth.     glipiZIDE XL 5 MG 24 hr tablet  Dose:  5 mg  Commonly known as:  GLUCOTROL XL  Take 5 mg by mouth every morning with breakfast.     hydroCHLOROthiazide 12.5 MG capsule  Dose:  12.5 mg  Commonly known as:  MICROZIDE  Take 12.5 mg by mouth daily     HYDROcodone-Acetaminophen 7.5-300 MG Tabs  Take by mouth every 4 (four) hours as needed.     hydrOXYzine 50 MG capsule  Dose:  50 mg  What changed:     how much to take   Another medication with the same name was removed. Continue taking this medication, and follow the directions you see here.  Commonly known as:  VISTARIL  Take 1 capsule (50 mg total) by mouth nightly.     isosorbide mononitrate 120 MG 24 hr tablet  Dose:  120 mg  What changed:     medication strength   how much to take   when to take this  Commonly known as:  IMDUR  Start taking on:  February 18, 2018  Take 1  tablet (120 mg total) by mouth Once a day at 6:00am     lidocaine 5 %  Dose:  1 patch  Commonly known as:  LIDODERM  For:  neuropathy to ankles and hands  Place 1 patch onto the skin 2 (two) times daily Remove & Discard patch within 12 hours or as directed by MD      meclizine 12.5 MG tablet  Commonly known as:  ANTIVERT  Take by mouth.     metoclopramide 10 MG tablet  Dose:  10 mg  Commonly known as:  REGLAN  Take 10 mg by mouth 4 (four) times daily     metoprolol succinate XL 50 MG 24 hr tablet  Dose:  50 mg  Commonly known as:  TOPROL-XL  Take 50 mg by mouth daily     metoprolol tartrate 50 MG tablet  Dose:  50 mg  Commonly known as:  LOPRESSOR  Take 50 mg by mouth daily.     nitroglycerin 0.4 MG SL tablet  Dose:  0.4 mg  Commonly known as:  NITROSTAT  Place 0.4 mg under the tongue every 5 (five) minutes as needed.     * NOVOLOG 100 UNIT/ML injection  Dose:  30 Units  Generic drug:  insulin aspart  Inject 30 Units into the skin 3 (three) times daily with meals      * insulin aspart 100 UNIT/ML injection  Dose:  35 Units  Commonly known as:  NovoLOG  Inject 35 Units into the skin nightly     ONE-A-DAY MENS Tabs  Dose:  1 tablet  Take 1 tablet by mouth every morning.     pantoprazole 40 MG tablet  Dose:  40 mg  Commonly known as:  PROTONIX  Take 40 mg by mouth daily.     pregabalin 75 MG capsule  Dose:  50 mg  Commonly known as:  LYRICA  Take 50 mg by mouth 3 (three) times daily.     traMADol 50 MG tablet  Dose:  50 mg  Commonly known as:  ULTRAM  Take 50 mg by mouth 3 (three) times daily as needed for Pain      TRESIBA FLEXTOUCH 100 UNIT/ML Sopn  Dose:  80 Unit  Generic drug:  Insulin Degludec  Inject 80 Unit into the skin nightly      vitamin B-12 1000 MCG tablet  Dose:  1,000 mcg  Commonly known as:  CYANOCOBALAMIN  Take 1,000 mcg by mouth daily.         * This list has 2 medication(s) that are the same as other medications prescribed for you. Read the directions carefully, and ask your doctor or other care  provider to review them with you.               Discharge Day Exam (02/17/2018):   Blood pressure 120/78, pulse 88, temperature 97.3 F (36.3 C), temperature source Oral, resp. rate 15, height 1.727 m (5\' 8" ), weight 99.2 kg (218 lb 11.1 oz), SpO2 97 %.    General: Patient is awake. In no acute distress.  HEENT: PERRL, EOMI, no conjunctival drainage, vision is intact.  Neck: supple, no thyromegaly.  Chest: CTA bilaterally. No rhonchi, no wheezing. No use of accessory muscles.  CVS: normal rate and regular rhythm no murmurs, without JVD.  Abdomen: soft, non-tender, no guarding or rigidity, with normal bowel sounds.  Extremities: No pitting edema, pulses palpable, no calf swelling and gross no deformity.  Skin: Warm, dry, no rash and no worrisome lesions.  NEURO: no motor or sensory deficits.  Psychiatric: alert, interactive, appropriate, normal affect.   Recent Labs    Recent Labs   Lab 02/17/18  0350 02/16/18  1909   WBC 6.4 5.2   RBC 4.71 4.59   Hemoglobin 14.1 14.1   Hematocrit 40.6 39.2   MCV 86 86   PLT CT 169 166         Recent Labs   Lab 02/17/18  0022 02/16/18  1909   Troponin I <0.01 <0.01     Lab Results   Component Value Date    HGBA1CPERCNT 9.5 03/05/2017     Recent Labs   Lab 02/17/18  0350 02/16/18  1909   Glucose 147* 230*   Sodium 138 139   Potassium 4.2 4.0   Chloride 103 103   CO2 29 28   BUN 14 14   Creatinine 0.73* 0.69*   EGFR 99 101   Calcium 8.9 8.7     Recent Labs   Lab 02/17/18  0350   Magnesium 1.6      Allergies:      Gabapentin; Glucophage [metformin hydrochloride]; Keflex [cephalexin]; Morphine; and Victoza [liraglutide]   Time spent on discharging the patient:  36 minutes   No results found.   Colonel Bald, MD         02/17/18 11:03 AM   MRN: [id number]                                      CSN: [id number] DOB: [birth year]

## 2018-02-17 NOTE — Plan of Care (Signed)
Problem: Chest Pain  Goal: Patient/Patient Care Companion demonstrates understanding of disease process, treatment plan, medications, and discharge plan  Outcome: Progressing

## 2018-02-18 MED ORDER — GENERIC EXTERNAL MEDICATION
0.40 mg | Status: DC
Start: ? — End: 2018-02-18

## 2018-02-18 MED ORDER — SODIUM CHLORIDE 0.9 % INJECTION SOLUTION
3.00 mL | Freq: Three times a day (TID) | INTRAMUSCULAR | Status: DC
Start: 2018-02-17 — End: 2018-02-18

## 2018-02-18 MED ORDER — HYDROXYZINE PAMOATE 25 MG CAPSULE
25.00 mg | ORAL_CAPSULE | ORAL | Status: DC
Start: 2018-02-17 — End: 2018-02-18

## 2018-02-18 MED ORDER — ATORVASTATIN 40 MG TABLET
40.00 mg | ORAL_TABLET | ORAL | Status: DC
Start: 2018-02-17 — End: 2018-02-18

## 2018-02-18 MED ORDER — ENOXAPARIN 40 MG/0.4 ML SUBCUTANEOUS SYRINGE
40.00 mg | INJECTION | SUBCUTANEOUS | Status: DC
Start: 2018-02-17 — End: 2018-02-18

## 2018-02-18 MED ORDER — METOPROLOL SUCCINATE ER 50 MG TABLET,EXTENDED RELEASE 24 HR
50.00 mg | ORAL_TABLET | ORAL | Status: DC
Start: 2018-02-18 — End: 2018-02-18

## 2018-02-18 MED ORDER — CLOPIDOGREL 75 MG TABLET
75.00 mg | ORAL_TABLET | ORAL | Status: DC
Start: 2018-02-18 — End: 2018-02-18

## 2018-02-18 MED ORDER — GENERIC EXTERNAL MEDICATION
650.00 mg | Status: DC | PRN
Start: ? — End: 2018-02-18

## 2018-02-18 MED ORDER — NITROGLYCERIN 0.4 MG SUBLINGUAL TABLET
0.40 mg | SUBLINGUAL_TABLET | SUBLINGUAL | Status: DC
Start: ? — End: 2018-02-18

## 2018-02-18 MED ORDER — FAMOTIDINE 20 MG TABLET
20.00 mg | ORAL_TABLET | ORAL | Status: DC
Start: 2018-02-17 — End: 2018-02-18

## 2018-02-19 NOTE — UM Notes (Signed)
Antoine Poche, RN, BSN  Phone [phone]  Fax [phone]  Utilization Review  [facility]      Lydon Vansickle  05-08-1955  Admitted obs on 11/1 and discharged on 11/2    Medicine History & Physical - [facility]  [employer]   Patient Name: ILIR, MAHRT LOS: 0 days   Attending Physician: Ester Rink, DO PCP: Clifton James, PA      Assessment and Plan:                                                              Chest pain  H/o MI in 2000  CAD s/p 4 stents  - labs and ECG pending but normal at OSH by report  - SL nitro prn chest pain  - spoke to Dr. Orvis Brill they will see in AM  - NPO midnight for possible stress/LHC although unlikely over the weekend  - cont plavix, lipitor, imdur, metoprolol  - telemetry    IDDM  Neuropathy  - cont insulin regimen decreased to 15 units lispro with meals and 60 units lantus qhs to avoid hypoglycemia inpatient  - cont lyrica    HTN - cont home meds  HLD - cont home meds  OSA - CPAP at night  GERD - pepcid      DVT PPx: lovenox  Dispo: inpatient  Code: full code   Active Hospital Problem List  Active Problems:    Chest pain       Subjective      History of Presenting Illness                                CC: chest pain  Cashis Rill is a 62 y.o. male patient h/o previous MI in 2000, CAD s/p 4 stents, IDDM, HTN, HLD, OSA, GERD followed by Dr. Howie Ill who presents as transfer from [facility] for chest pain. Pt states he has substernal sharp chest pain that radiates to his back and up his left neck. Started 1.5 hours prior to presentation at OSH. States it is somewhat relieved by rest and worse with exertion. States he had a similar pain with his last MI. Had a normal stress test earlier this year. No ECG changes and normal troponin but given his history transferred here for cardiology evaluation. Currently says his chest pain is 3/10 and is improved by nitro.    Review of Systems:    All systems were reviewed and are  negative unless pertinent positive stated in HPI.  CONSTITUTIONAL: No night sweats. No fatigue. No fever or chills.   Eyes: No visual changes. No eye pain.   ENT: No runny nose. No epistaxis.   RESPIRATORY: No cough. No hemoptysis. No shortness of breath.   CARDIOVASCULAR: No chest pains. No palpitations.   GASTROINTESTINAL: No abdominal pain. No vomiting. No diarrhea or constipation.   GENITOURINARY: No urgency. No frequency. No dysuria.   MUSCULOSKELETAL: No musculoskeletal pain. No joint swelling.   NEUROLOGICAL: No headache or neck pain. No syncope or seizure.   PSYCHIATRIC: No depression, no psychosis.  SKIN: No rashes. No lesions. No petechiae.  ENDOCRINE: No unexplained weight loss. No polydipsia.   HEMATOLOGIC: No anemia. No  purpura. No bleeding.   ALLERGIC AND IMMUNOLOGIC: No pruritus. No swelling       Objective          Physical Exam:      Vitals: T:97.7 F (36.5 C) (Oral),  BP:130/69, HR:93, RR:16, SaO2:94%    1) General Appearance: Alert and oriented x 4. In no acute distress.   2) Eyes: Pink conjunctiva, anicteric sclera. Pupils are equally reactive to light.  3) ENT: Oral mucosa moist with no pharyngeal congestion, erythema or swelling.  4) Neck: Supple, with full range of motion. Trachea is central, no JVD noted  5) Chest: Clear to auscultation bilaterally, no wheezes or rhonchi.  5) CVS: normal rate and regular rhythm, with no murmurs.  6) Abdomen: Soft, non-tender, no palpable mass. Bowel sounds normal. No CVA tenderness  7) Extremities: No pitting edema, pulses palpable, no calf swelling and gross no deformity.  8) Skin: Warm, dry with normal skin turgor, no rash  9) Lymphatics: No lymphadenopathy in axillary, cervical and inguinal area.   10) Neurological: Cranial nerves II-XII intact. No gross focal motor or sensory deficits noted.  11) Psychiatric: Affect is appropriate. No hallucinations.  Patient Vitals for the past 12 hrs:   BP Temp Pulse Resp   02/16/18 1922 130/69 97.7 F (36.5 C)  93 16   02/16/18 1734 132/87 97.9 F (36.6 C) 97 16     Weight Monitoring 03/06/2017 03/07/2017 03/08/2017 06/01/2017 06/27/2017 11/16/2017 02/16/2018   Height - - - - - - 172.7 cm   Height Method - - - - - - Stated   Weight 89.4 kg 88.3 kg 85.3 kg 94.484 kg 91.173 kg 95.301 kg 99.202 kg   Weight Method Standing Scale Standing Scale Standing Scale - - - Standing Scale   BMI (calculated) - - - - - - 33.3 kg/m2       EKG:     RecentResults   No results found for this or any previous visit (from the past 24 hour(s)).         PastMedicalHistory   Past Medical History:   Diagnosis Date   . CAD (coronary artery disease)    . Cellulitis of finger of left hand 10/22/14   . Diabetes mellitus    . Gastroesophageal reflux disease    . Hernia    . Hyperlipidemia    . Hypertension    . Myocardial infarction    . Neuropathy     diabetic   . Presence of stent in coronary artery     4 STENTS   . Sleep apnea    . TIA (transient ischemic attack)    . Type 2 diabetes mellitus, controlled          PastSurgicalHistory         Past Surgical History:   Procedure Laterality Date   . 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 CARPAL TUNNEL RELEASE Left     05/30/17   . CERVICAL FUSION     . CIRCUMCISION, > 13 YEARS, ADULT N/A 09/08/2014    Procedure: CIRCUMCISION, > 13 YEARS, ADULT;  Surgeon: Ellwood Handler, MD;  Location: [facility];  Service: Urology;  Laterality: N/A;  CIRCUMCISION   . CORONARY ANGIOPLASTY WITH STENT PLACEMENT      2 stents   . CORONARY ANGIOPLASTY WITH STENT  PLACEMENT  12/13/12    DES to OM1, 2 stents   . EGD, COLONOSCOPY N/A 12/19/2017    Procedure: EGD, COLONOSCOPY;  Surgeon: Salena Saner, MD;  Location: [facility];  Service: Gastroenterology;  Laterality: N/A;   . LUMBAR LAMINECTOMY     .  RELEASE, CUBITAL TUNNEL Left 05/30/2017         FamilyHistory           Family History   Problem Relation Age of Onset   . Heart disease Mother    . Stroke Mother    . Diabetes Mother    . Heart attack Mother    . Heart disease Father    . Stroke Father    . Heart attack Father    . Diabetes Sister          Social History           Tobacco Use   . Smoking status: Never Smoker   . Smokeless tobacco: Never Used   Substance Use Topics   . Alcohol use: Yes     Comment: once a year maybe            Allergies   Allergen Reactions   . Gabapentin      "Makes crazy/hyper"   . Glucophage [Metformin Hydrochloride] Nausea And Vomiting   . Keflex [Cephalexin] Nausea And Vomiting   . Morphine      Hallucinations   . Victoza [Liraglutide] Nausea And Vomiting      No results found.        Home Medications          Med List Status:  Complete Set By: Eula Flax, RN at 02/16/2018  5:49 PM                atorvastatin (LIPITOR) 40 MG tablet     Take 40 mg by mouth nightly.     CALCIUM-MAGNESIUM-ZINC PO     Take 3 tablets by mouth daily        cetirizine (ZYRTEC) 10 MG tablet     Take 10 mg by mouth daily.     clopidogrel (PLAVIX) 75 mg tablet     Take 1 tablet (75 mg total) by mouth daily.     hydroCHLOROthiazide (MICROZIDE) 12.5 MG capsule     Take 12.5 mg by mouth daily     hydrOXYzine (VISTARIL) 25 MG capsule     Take 25 mg by mouth nightly     insulin aspart (NOVOLOG) 100 UNIT/ML injection     Inject 30 Units into the skin 3 (three) times daily with meals        insulin aspart (NOVOLOG) 100 UNIT/ML injection     Inject 35 Units into the skin nightly     Insulin Degludec (TRESIBA FLEXTOUCH) 100 UNIT/ML Solution Pen-injector     Inject 80 Unit into the skin nightly        isosorbide mononitrate (IMDUR) 30 MG 24 hr tablet     Take 30 mg by mouth every morning.         lidocaine (LIDODERM) 5 %     Place 1 patch onto the skin 2 (two) times daily Remove & Discard patch  within 12 hours or as directed by MD        metoprolol succinate XL (TOPROL-XL) 50 MG 24 hr tablet     Take 50 mg by mouth daily     Multiple Vitamin (ONE-A-DAY MENS) Tab  Take 1 tablet by mouth every morning.     nitroglycerin (NITROSTAT) 0.4 MG SL tablet     Place 0.4 mg under the tongue every 5 (five) minutes as needed.     pantoprazole (PROTONIX) 40 MG tablet     Take 40 mg by mouth daily.     pregabalin (LYRICA) 75 MG capsule     Take 50 mg by mouth 3 (three) times daily.         traMADol (ULTRAM) 50 MG tablet     Take 50 mg by mouth 3 (three) times daily as needed for Pain        vitamin B-12 (CYANOCOBALAMIN) 1000 MCG tablet     Take 1,000 mcg by mouth daily.                     Flagged for Removal             Dapagliflozin Propanediol (FARXIGA) 10 MG Tab     Take by mouth.     glipiZIDE XL (GLUCOTROL XL) 5 MG 24 hr tablet     Take 5 mg by mouth every morning with breakfast.     Hydrocodone-Acetaminophen 7.5-300 MG Tab     Take by mouth every 4 (four) hours as needed.     hydrOXYzine (VISTARIL) 50 MG capsule     Take 1 capsule (50 mg total) by mouth nightly.     Patient taking differently:  Take 25 mg by mouth nightly        meclizine (ANTIVERT) 12.5 MG tablet     Take by mouth.     metoclopramide (REGLAN) 10 MG tablet     Take 10 mg by mouth 4 (four) times daily     metoprolol tartrate (LOPRESSOR) 50 MG tablet     Take 50 mg by mouth daily.             Meds given in the ED:  Medications   influenza quadrivalent-split vaccine (PF) (FLUARIX/FLULAVAL) IM injection 0.5 mL (has no administration in time range)   atorvastatin (LIPITOR) tablet 40 mg (has no administration in time range)   clopidogrel (PLAVIX) tablet 75 mg (has no administration in time range)   hydrOXYzine (VISTARIL) capsule 25 mg (has no administration in time range)   insulin lispro (HumaLOG) injection pen 15 Units (has no administration in time range)   insulin glargine (LANTUS  SOLOSTAR) injection pen 60 Units (has no administration in time range)   isosorbide mononitrate (IMDUR) 24 hr tablet 30 mg (has no administration in time range)   metoprolol succinate XL (TOPROL-XL) 24 hr tablet 50 mg (has no administration in time range)   nitroglycerin (NITROSTAT) SL tablet 0.4 mg (has no administration in time range)   famotidine (PEPCID) tablet 20 mg (has no administration in time range)   pregabalin (LYRICA) capsule 50 mg (has no administration in time range)   sodium chloride (PF) 0.9 % injection 3 mL (has no administration in time range)   naloxone (NARCAN) injection 0.4 mg (has no administration in time range)   enoxaparin (LOVENOX) syringe 40 mg (has no administration in time range)   acetaminophen (TYLENOL) tablet 650 mg (has no administration in time range)     Or   acetaminophen (TYLENOL) 160 MG/5ML oral solution 650 mg (has no administration in time range)     Or   acetaminophen (TYLENOL) suppository 650 mg (has no administration in time range)  Ester Rink, DO     02/16/18,7:25 PM   MRN: [id number]                                      CSN: [id number] DOB: 1955-05-05                     [facility]  Consultation Note    Date Time: 02/17/18 8:15 AM  Patient Name: ORIE, BAXENDALE  MRN#: [id number]  DOB: Feb 11, 1956  Consulting Physician: Colonel Bald, MD    Reason for Consult:   Reason for consult: The patient was seen at the request of Colonel Bald, MD for the evaluation of chest pain.    History:   Fin Hupp Zuberis a 62 y.o.malepatientwith a h/o previous MI in 2000, CAD s/p 4 stents (all remote), IDDM, HTN, HLD, OSA, spinal stenosis, neuropathy, and GERD followed by Dr. Howie Ill who presents as transfer from [facility] for chest pain.  He describes sharp pain fairly localized in his lower mid chest, worse with bending over, and sharp pain in his left neck/face with associated numbness.  This occurred yesterday am while getting dressed.   It continued essentially all day and night although he was able to sleep.  This is not worse with exertion.  NTG helped some.  He has not had this before.  He does not walk that much in his daily life due to neuropathy and spinal stenosis but denies exertional CP.  No dyspnea.  Troponins negative, ECGs normal.  He has been taking all his meds.  Recent SPECT 07/2017 low risk, no ischemia.  He feels this may be due to his cervical spine disease.    Past Medical History:     PastMedicalHistory        Past Medical History:   Diagnosis Date   . CAD (coronary artery disease)    . Cellulitis of finger of left hand 10/22/14   . Diabetes mellitus    . Gastroesophageal reflux disease    . Hernia    . Hyperlipidemia    . Hypertension    . Myocardial infarction    . Neuropathy     diabetic   . Presence of stent in coronary artery     4 STENTS   . Sleep apnea    . TIA (transient ischemic attack)    . Type 2 diabetes mellitus, controlled           Past Surgical History:     PastSurgicalHistory         Past Surgical History:   Procedure Laterality Date   . 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 CARPAL TUNNEL RELEASE Left     05/30/17   . CERVICAL FUSION     . CIRCUMCISION, > 13 YEARS, ADULT N/A 09/08/2014    Procedure: CIRCUMCISION, > 13 YEARS, ADULT;  Surgeon: Ellwood Handler, MD;  Location: [facility];  Service: Urology;  Laterality: N/A;  CIRCUMCISION   . CORONARY ANGIOPLASTY WITH STENT PLACEMENT      2 stents   . CORONARY ANGIOPLASTY WITH STENT PLACEMENT  12/13/12    DES to OM1, 2 stents   . EGD, COLONOSCOPY N/A 12/19/2017    Procedure:  EGD, COLONOSCOPY;  Surgeon: Salena Saner, MD;  Location: [facility];  Service: Gastroenterology;  Laterality: N/A;   . LUMBAR LAMINECTOMY     . RELEASE, CUBITAL  TUNNEL Left 05/30/2017          Problem List:   Active Problems:    Chest pain      Allergies:           Allergies   Allergen Reactions   . Gabapentin      "Makes crazy/hyper"   . Glucophage [Metformin Hydrochloride] Nausea And Vomiting   . Keflex [Cephalexin] Nausea And Vomiting   . Morphine      Hallucinations   . Victoza [Liraglutide] Nausea And Vomiting       Medications:             Prior to Admission medications    Medication Sig Start Date End Date Taking? Authorizing Provider   atorvastatin (LIPITOR) 40 MG tablet Take 40 mg by mouth nightly.   Yes [provider]   CALCIUM-MAGNESIUM-ZINC PO Take 3 tablets by mouth daily      Yes [provider]   cetirizine (ZYRTEC) 10 MG tablet Take 10 mg by mouth daily.   Yes [provider]   clopidogrel (PLAVIX) 75 mg tablet Take 1 tablet (75 mg total) by mouth daily. 03/09/17  Yes Clabe Seal, MD   hydroCHLOROthiazide (MICROZIDE) 12.5 MG capsule Take 12.5 mg by mouth daily   Yes [provider]   hydrOXYzine (VISTARIL) 25 MG capsule Take 25 mg by mouth nightly   Yes [provider]   insulin aspart (NOVOLOG) 100 UNIT/ML injection Inject 30 Units into the skin 3 (three) times daily with meals      Yes [provider]   insulin aspart (NOVOLOG) 100 UNIT/ML injection Inject 35 Units into the skin nightly   Yes [provider]   Insulin Degludec (TRESIBA FLEXTOUCH) 100 UNIT/ML Solution Pen-injector Inject 80 Unit into the skin nightly      Yes [provider]   isosorbide mononitrate (IMDUR) 30 MG 24 hr tablet Take 30 mg by mouth every morning.     02/28/13  Yes [provider]   lidocaine (LIDODERM) 5 % Place 1 patch onto the skin 2 (two) times daily Remove & Discard patch within 12 hours or as directed by MD      Yes [provider]   metoprolol succinate XL (TOPROL-XL) 50 MG 24 hr tablet Take 50 mg by mouth daily   Yes [provider]   Multiple Vitamin (ONE-A-DAY MENS) Tab Take 1 tablet by mouth every morning.   Yes [provider]   nitroglycerin (NITROSTAT) 0.4 MG SL tablet Place 0.4 mg under the tongue every 5 (five) minutes as needed.   Yes [provider]   pantoprazole (PROTONIX) 40 MG tablet Take 40 mg by mouth daily.   Yes [provider]   pregabalin (LYRICA) 75 MG capsule Take 50 mg by mouth 3 (three) times daily.       Yes [provider]   traMADol (ULTRAM) 50 MG tablet Take 50 mg by mouth 3 (three) times daily as needed for Pain      Yes [provider]   vitamin B-12 (CYANOCOBALAMIN) 1000 MCG tablet Take 1,000 mcg by mouth daily.   Yes [provider]   Dapagliflozin Propanediol (FARXIGA) 10 MG Tab Take by mouth.    [provider]   glipiZIDE XL (GLUCOTROL XL)  5 MG 24 hr tablet Take 5 mg by mouth every morning with breakfast.    [provider]   Hydrocodone-Acetaminophen 7.5-300 MG Tab Take by mouth every 4 (four) hours as needed.    [provider]   hydrOXYzine (VISTARIL) 50 MG capsule Take 1 capsule (50 mg total) by mouth nightly.  Patient taking differently: Take 25 mg by mouth nightly    03/19/13   Brayton Caves, MD   meclizine (ANTIVERT) 12.5 MG tablet Take by mouth. 12/13/16   [provider]   metoclopramide (REGLAN) 10 MG tablet Take 10 mg by mouth 4 (four) times daily    [provider]   metoprolol tartrate (LOPRESSOR) 50 MG tablet Take 50 mg by mouth daily.        [provider]       Family History:     FamilyHistory         Family History   Problem Relation Age of Onset   . Heart disease Mother    . Stroke Mother    . Diabetes Mother    . Heart attack Mother    . Heart disease Father    . Stroke Father    . Heart attack Father    . Diabetes Sister           Social History:     Social History              Socioeconomic History   . Marital status: Single      Spouse name: Not on file   . Number of children: Not on file   . Years of education: Not on file   . Highest education level: Not on file   Occupational History   . Not on file   Social Needs   . Financial resource strain: Not on file   . Food insecurity:     Worry: Not on file     Inability: Not on file   . Transportation needs:     Medical: Not on file     Non-medical: Not on file   Tobacco Use   . Smoking status: Never Smoker   . Smokeless tobacco: Never Used   Substance and Sexual Activity   . Alcohol use: Yes     Comment: once a year maybe   . Drug use: No   . Sexual activity: Not on file   Lifestyle   . Physical activity:     Days per week: Not on file     Minutes per session: Not on file   . Stress: Not on file   Relationships   . Social connections:     Talks on phone: Not on file     Gets together: Not on file     Attends religious service: Not on file     Active member of club or organization: Not on file     Attends meetings of clubs or organizations: Not on file     Relationship status: Not on file   . Intimate partner violence:     Fear of current or ex partner: Not on file     Emotionally abused: Not on file     Physically abused: Not on file     Forced sexual activity: Not on file   Other Topics Concern   . Not on file   Social History Narrative   . Not on file          Review of  Systems:     All systems reviewed and negative other than those noted in the HPI.    Visit Vitals  BP 120/78   Pulse 88   Temp 97.3 F (36.3 C) (Oral)   Resp 15   Ht 1.727 m (5\' 8" )   Wt 99.2 kg (218 lb 11.1 oz)   SpO2 97%   BMI 33.25 kg/m       Intake and Output Summary (Last 24 hours) at Date Time    Intake/Output Summary (Last 24 hours) at 02/17/2018 0815  Last data filed at 02/16/2018 2339      Gross per 24 hour   Intake 220 ml   Output -   Net 220 ml       General/Constitutional: no acute distress  HEENT: moist mucous membranes  Neck: supple, no JVD  Heart/Cardiovascular:    RRR  no murmurs/rubs/gallops  2+ radial pulses  Respiratory/Lungs: non-labored, clear to auscultation throughout  Abdominal/Gastrointestinal: soft, nontender  Extremities: warm and well-perfused, no edema x4  Skin: no rash  Neurological: alert and oriented x3, grossly nonfocal  Psych: calm      Labs Reviewed:   Recent CMP        Recent Labs   Lab 02/17/18  0350 02/16/18  1909   Glucose 147* 230*   BUN 14 14   Creatinine 0.73* 0.69*   Sodium 138 139   Potassium 4.2 4.0   Magnesium 1.6  --    CO2 29 28   Calcium 8.9 8.7       Recent CARDIAC ENZYMES        Recent Labs   Lab 02/17/18  0022 02/16/18  1909   Troponin I <0.01 <0.01   B-Natriuretic Peptide  --  15.6       Recent TSH       Recent PT/PTT       Recent CBC WITH DIFF        Recent Labs   Lab 02/17/18  0350 02/16/18  1909   WBC 6.4 5.2   RBC 4.71 4.59   Hemoglobin 14.1 14.1   Hematocrit 40.6 39.2   MCV 86 86   MPV 7.5 7.2   PLT CT 169 166       Recent LIPID PANEL       Cholesterol (mg/dL)   Date Value   16/01/9603 82   11/18/2016 101   04/27/2016 115        Triglycerides (mg/dL)   Date Value   54/12/8117 146   11/18/2016 253 (H)   04/27/2016 329 (H)        HDL (mg/dL)   Date Value   14/78/2956 27 (L)   11/18/2016 31 (L)   04/27/2016 27 (L)        LDL Calculated (mg/dL)   Date Value   21/30/8657 26   11/18/2016 19   04/27/2016 22        Rads:         Radiology Results (24 Hour)     ** No results found for the last 24 hours. **          Cardiovascular Workup:     LHC 10/21/11 (Dr. Duffy Rhody)  1. Prior right coronary stent which remains patent with minimal luminal encroachment.  2. 3-vessel atherosclerosis without significant obstructive disease.  3. Normal overall systolic function with focal inferior basal hypokinesis  RECOMMENDATIONS: Medical management with vigorous secondary prevention.    Echo 10/16/12  1. The left ventricle is  thickened in a fashion consistent with mild concentric hypertrophy. Global systolic function was normal. The left  ventricular ejection fraction is estimated to be 55-60%.   2. Diffuse thickening (sclerosis) without reduced excursion in the aortic valve. There is evidence of trivial (trace) aortic regurgitation.   3. There is evidence of trivial (trace) mitral regurgitation. There is evidence of sclerosis of the mitral valve.   4. There is evidence of trace (trivial) tricuspid regurgitation. The estimated right ventricular systolic pressure is 18 mmHg.   5. There is no evidence of pericardial effusion.   6. There is no previous echocardiogram available for comparison.     ABI/PVR 10/16/12  1. Normal bilateral ABIs at rest and post exercise.   2. Limited PVRs at ankles and toe pressures are normal.    Nuclear stress 12/03/12  1. LV systolic function appears moderately reduced   2. Calculated LVEF 38%   3. Gated images show moderate global hypokinesis.   4. Perfusion images show a large area of ischemia from base to distal in the infero-lateral wall with atleast moderate reversibility.  5. High risk study due to size of defect and reduced EF.    LHC 12/13/12  1. 99% proximal OM2 stenosis status post percutaneous balloon angioplasty followed by drug-eluting stent placement, Xience 3.25 x 23 mm.  2. Prior stents in RCA and proximal left circumflex noted to be patent.    Nuclear stress 03/18/13  1. Comparing the baseline and the stress images, no significant fixed or reversible perfusion defect noted.  2. Gated imaging shows normal EF.  3. Low risk Lexiscan nuclear stress test.    LHC 03/19/13  1. Patent stents in the RCA and circumflex marginal.  2. Significant 1st diagonal branch disease.  3. Normal LV systolic and diastolic function.    Nuclear stress 04/12/14  Unremarkable Lexiscan Cardiolite stress test without evidence for infarct or ischemia, and visually normal-appearing left ventricular ejection fraction.    Sleep study 08/20/15  1. Moderate sleep apnea with significant desaturations.  2. RECOMMENDATION: For the patient to  undergo a full night CPAP titration for the moderate sleep apnea with desaturations.    Echo 03/07/17  1.The left ventricular cavity size is normal. The left ventricular wall thicknesses are normal. The leftventricular systolic function is normal. The left ventricular ejection fraction is normal, estimated at 55-60%.There are no regional wall motion abnormalities present. There is normal diastolic function.  2.No patent foramen ovale is demonstrated by agitated saline injection.   3.There is no significant valvular heart disease.    Event monitor 04/24/17- 05/25/17  No symptoms reports. There were strips showing sinus rhythm with PVC. There are no strips to review.     Sleep study 06/15/2017  Severe sleep apnea. Normalizing with a continuous positive airway pressure of 8.    Nuclear stress 07/25/17  Baseline EKG shows NSR with T wave abnormality. Lexiscan EKG shows no significant EKG changes from baseline.  Perfusion images show a small fixed defect of mild severity in the mid inferior wall with no clinically significant ischemia seen and normal wall motion analysis. SSS 1, SDS 0.  Ejection Fraction: 56%  Abnormal, but low risk study.  Compared to prior study report from 11/19/2016, images now do not show any ischemia.    ABI/PVR 08/09/17  Resting and post exercise ABI suggestive of no significant arterial insufficiency in b/l LE. Toe pressures adequate for healing.    30 Day Event Monitor 07/2017  I have several strips for  review, which show sinus tachycardia with PVC and sinus tachycardia.These were all auto-triggered events with no reported symptoms.There is some  baseline artifact on the strips as well.I have no other strips for review. No high-grade arrhythmias seen as detailed above.      Assessment:   CP, atypical, negative troponins/ECGs despite hours of pain, recent SPECT low risk with no ischemia  H/o CAD s/p remote PCI  Neuropathy/spinal stenosis  DM    Recommendations:     1. Continue BB,  Plavix, statin  2. Will increase Imdur to 120 mg (he had been on 60 mg as outpt, denies headaches) as therapeutic trial though doubt pain is cardiac given the above evaluation  3. No plans for inpt cardiac testing at this time  4. F/u in clinic in 4 weeks    Thank you for consulting [facility] and allowing [place] to participate in this pt's care! We will follow along while the pt is in the hospital.    Seward Speck, MD, [facility], RPVI  Interventional Cardiology  [facility]  [address]  [place], [place] [address]

## 2018-02-19 NOTE — UM Notes (Signed)
[facility] Utilization Management Review Sheet    Facility :  [facility]    NAME: Michael [name]  MR#: [id number]    CSN#: [id number]    ROOM: 354/354-A AGE: 62 y.o.    ADMIT DATE AND TIME: 02/16/2018  5:22 PM      PATIENT CLASS:    ATTENDING PHYSICIAN: No att. providers found  PAYOR:Payor: MEDICAID HMO / Plan: THE HEALTH PLAN / Product Type: MANAGED MEDICAID /       AUTH #:     DIAGNOSIS:   HISTORY:   Past Medical History:   Diagnosis Date   . CAD (coronary artery disease)    . Cellulitis of finger of left hand 10/22/14   . Diabetes mellitus    . Gastroesophageal reflux disease    . Hernia    . Hyperlipidemia    . Hypertension    . Myocardial infarction    . Neuropathy     diabetic   . Presence of stent in coronary artery     4 STENTS   . Sleep apnea    . TIA (transient ischemic attack)    . Type 2 diabetes mellitus, controlled        DATE OF REVIEW: 02/19/2018    VITALS: BP 120/78   Pulse 88   Temp 97.3 F (36.3 C) (Oral)   Resp 15   Ht 1.727 m (5\' 8" )   Wt 99.2 kg (218 lb 11.1 oz)   SpO2 97%   BMI 33.25 kg/m     Active Hospital Problems    Diagnosis   . Spinal stenosis   . Type 2 diabetes mellitus with diabetic neuropathy   . TIA (transient ischemic attack)   . Type 2 diabetes mellitus   . Hypertension   . Chest pain   . CAD (coronary artery disease)       Admission Review  History   62 y.o. male patient h/o previous MI in 2000, CAD s/p 4 stents, IDDM, HTN, HLD, OSA, GERD followed by Dr. Howie Ill who presents as transfer from [facility] for chest pain. Pt states he has substernal sharp chest pain that radiates to his back and up his left neck. Started 1.5 hours prior to presentation at OSH. States it is somewhat relieved by rest and worse with exertion. States he had a similar pain with his last MI. Had a normal stress test earlier this year. No ECG changes and normal troponin but given his history transferred here for cardiology evaluation. Currently says his chest pain is 3/10 and  is improved by nitro.  VS  T:97.7 F (36.5 C) (Oral),  BP:130/69, HR:93, RR:16, SaO2:94%  Labs  Trop <0.01,     Assessment/Plan  Chest pain  H/o MI in 2000  CAD s/p 4 stents  - labs and ECG pending but normal at OSH by report  - SL nitro prn chest pain  - spoke to Dr. Orvis Brill they will see in AM  - NPO midnight for possible stress/LHC although unlikely over the weekend  - cont plavix, lipitor, imdur, metoprolol  - telemetry    Admission Orders  Tele, card consult

## 2018-02-21 NOTE — UM Notes (Signed)
Antoine Poche, RN, BSN  Phone [phone]  Fax [phone]  Utilization Review  [facility]      Michael [name]  02-15-1956  Admitted observation 11/1 and discharged on 02/17/2018  Faxed clinical on 11/42019  Awaiting approval

## 2018-03-09 NOTE — Procedures (Signed)
[facility] [place]  [address] [name]  [address]  [place] [address]  [phone]       Date: 02/12/2018  Patient Name: Edwin [name]  MRN#: [id number]  DOB: 07-Oct-1955    Remote ILR Device Interrogation    Model/SN Medtronic [id number]  Implant Date Sep 14, 2017  Loop Recorder    Patient's ILR was checked by remote access.     30 day analysis was performed of patient's implantable loop recorder, including analysis of programmed parameters, battery, heart rhythm, counters, any observation summaries, and alerts.  Please see programmer printout for further details (scanned document).    Battery     OK    Events/Episodes     None    Diagnostic Observations     No new observations.      Follow up in 1 month, remote

## 2018-03-19 ENCOUNTER — Encounter (INDEPENDENT_AMBULATORY_CARE_PROVIDER_SITE_OTHER): Payer: Self-pay | Admitting: Cardiovascular Disease

## 2018-03-27 ENCOUNTER — Other Ambulatory Visit (HOSPITAL_BASED_OUTPATIENT_CLINIC_OR_DEPARTMENT_OTHER): Payer: Self-pay

## 2018-03-27 ENCOUNTER — Ambulatory Visit (INDEPENDENT_AMBULATORY_CARE_PROVIDER_SITE_OTHER): Payer: MEDICAID | Admitting: NEUROSURGERY

## 2018-03-27 ENCOUNTER — Encounter (INDEPENDENT_AMBULATORY_CARE_PROVIDER_SITE_OTHER): Payer: Self-pay | Admitting: NEUROSURGERY

## 2018-03-27 VITALS — BP 121/77 | HR 77 | Ht 68.0 in | Wt 212.0 lb

## 2018-03-27 DIAGNOSIS — M21372 Foot drop, left foot: Secondary | ICD-10-CM

## 2018-03-27 DIAGNOSIS — E1142 Type 2 diabetes mellitus with diabetic polyneuropathy: Secondary | ICD-10-CM

## 2018-03-27 DIAGNOSIS — M4810 Ankylosing hyperostosis [Forestier], site unspecified: Secondary | ICD-10-CM

## 2018-03-27 DIAGNOSIS — M6250 Muscle wasting and atrophy, not elsewhere classified, unspecified site: Secondary | ICD-10-CM

## 2018-03-27 DIAGNOSIS — Z8614 Personal history of Methicillin resistant Staphylococcus aureus infection: Secondary | ICD-10-CM

## 2018-03-27 DIAGNOSIS — M47819 Spondylosis without myelopathy or radiculopathy, site unspecified: Secondary | ICD-10-CM

## 2018-03-27 DIAGNOSIS — E1165 Type 2 diabetes mellitus with hyperglycemia: Secondary | ICD-10-CM

## 2018-03-27 DIAGNOSIS — M542 Cervicalgia: Secondary | ICD-10-CM

## 2018-03-27 DIAGNOSIS — I519 Heart disease, unspecified: Secondary | ICD-10-CM

## 2018-03-27 DIAGNOSIS — M546 Pain in thoracic spine: Secondary | ICD-10-CM

## 2018-03-27 DIAGNOSIS — R260 Ataxic gait: Secondary | ICD-10-CM

## 2018-03-27 DIAGNOSIS — M48061 Spinal stenosis, lumbar region without neurogenic claudication: Secondary | ICD-10-CM

## 2018-03-27 DIAGNOSIS — M549 Dorsalgia, unspecified: Secondary | ICD-10-CM

## 2018-03-27 DIAGNOSIS — G8929 Other chronic pain: Secondary | ICD-10-CM

## 2018-03-27 DIAGNOSIS — Z9889 Other specified postprocedural states: Secondary | ICD-10-CM
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
## 2018-03-27 NOTE — Patient Instructions (Signed)
Obtain MRI scans of cervical thoracic and lumbar spine at [facility] facility prior to insulin pump placement

## 2018-03-28 ENCOUNTER — Encounter (INDEPENDENT_AMBULATORY_CARE_PROVIDER_SITE_OTHER): Payer: Self-pay | Admitting: NEUROSURGERY

## 2018-03-28 NOTE — Nursing Note (Signed)
As ordered by Dr. Everardo Beals, I phoned to [facility] in [place], [place]  Ativan 1mg  #2 sig:  Take one tablet po 30 minutes prior to MRI and one tablet po at time of MRI if needed, no refills.  Patient advised of RX and not to drive while taking medication.

## 2018-04-13 ENCOUNTER — Ambulatory Visit (INDEPENDENT_AMBULATORY_CARE_PROVIDER_SITE_OTHER): Payer: MEDICAID

## 2018-04-13 DIAGNOSIS — Z4509 Encounter for adjustment and management of other cardiac device: Secondary | ICD-10-CM

## 2018-04-13 DIAGNOSIS — I639 Cerebral infarction, unspecified: Secondary | ICD-10-CM

## 2018-04-17 NOTE — Procedures (Signed)
[facility] [place]  [name]  [address]  [place] [address]  [phone]       Date: 04/13/2018  Patient Name: Edwin [name]  MRN#: [id number]  DOB: 01/04/1956    Remote ILR Device Interrogation    Model/SN Medtronic [id number]  Implant Date Sep 14, 2017  Loop Recorder    Patient's ILR was checked by remote access.     31 day analysis was performed of patient's implantable loop recorder, including analysis of programmed parameters, battery, heart rhythm, counters, any observation summaries, and alerts.  Please see programmer printout for further details (scanned document).    Battery     OK    Events/Episodes     None    Diagnostic Observations     No new observations.      Follow up in 1 month, remote    Constance Holster, RN  04/17/2018, 11:54

## 2018-04-19 ENCOUNTER — Telehealth (INDEPENDENT_AMBULATORY_CARE_PROVIDER_SITE_OTHER): Payer: Self-pay | Admitting: NEUROSURGERY

## 2018-04-19 NOTE — Telephone Encounter (Signed)
I called patient to notify him that his insurance has approved all 3 MRI's. He expressed understanding and wrote down the telephone number to schedule at [facility].

## 2018-04-23 ENCOUNTER — Ambulatory Visit (INDEPENDENT_AMBULATORY_CARE_PROVIDER_SITE_OTHER): Payer: Self-pay | Admitting: NEUROSURGERY

## 2018-04-23 NOTE — Telephone Encounter (Signed)
Notified the patient that our office is not going to be able to fill the ativan greater than 1mg  so he will need to contact his PCP and he understood

## 2018-04-23 NOTE — Telephone Encounter (Signed)
-----   Message from Thornton Dales., PA-C sent at 04/23/2018  8:44 AM EST -----  Regarding: RE: Everardo Beals   ----- Message from Ulyess Blossom sent at 04/20/2018  4:16 PM EST -----  Frey  Pt doesn't have anyone drive him home from the MRI on 1.9.2020.  Asking if he can drive himself if he gets some food and waits an allotted amt of time to let the med ware off.  Please advise.    ----- Message from Brett Fairy sent at 04/20/2018  3:39 PM EST -----  Pt states that the last time he had an MRI, the lorazepam 1mg , last time they gave him up to 4mg  to help for all of his scheduled MRIs.     Preferred Pharmacy     [facility] [phone] - [place], [place] - [address] AT   [facility] [address]    [address] [place] [address]    Phone: [phone] Fax: [phone]    Not a 24 hour pharmacy; exact hours not known.

## 2018-04-26 ENCOUNTER — Ambulatory Visit (HOSPITAL_BASED_OUTPATIENT_CLINIC_OR_DEPARTMENT_OTHER)
Admission: RE | Admit: 2018-04-26 | Discharge: 2018-04-26 | Disposition: A | Discharge status: Home / Self Care | Payer: MEDICAID | Source: Ambulatory Visit | Attending: NEUROSURGERY | Admitting: NEUROSURGERY

## 2018-04-26 ENCOUNTER — Other Ambulatory Visit (INDEPENDENT_AMBULATORY_CARE_PROVIDER_SITE_OTHER): Payer: Self-pay | Admitting: NEUROSURGERY

## 2018-04-26 DIAGNOSIS — M546 Pain in thoracic spine: Secondary | ICD-10-CM

## 2018-04-26 DIAGNOSIS — Z9889 Other specified postprocedural states: Secondary | ICD-10-CM

## 2018-04-26 DIAGNOSIS — M21372 Foot drop, left foot: Secondary | ICD-10-CM

## 2018-04-26 DIAGNOSIS — E1165 Type 2 diabetes mellitus with hyperglycemia: Secondary | ICD-10-CM

## 2018-04-26 DIAGNOSIS — M48061 Spinal stenosis, lumbar region without neurogenic claudication: Secondary | ICD-10-CM

## 2018-04-26 DIAGNOSIS — M4810 Ankylosing hyperostosis [Forestier], site unspecified: Secondary | ICD-10-CM

## 2018-04-26 DIAGNOSIS — E1142 Type 2 diabetes mellitus with diabetic polyneuropathy: Secondary | ICD-10-CM

## 2018-04-26 DIAGNOSIS — M549 Dorsalgia, unspecified: Secondary | ICD-10-CM

## 2018-04-26 DIAGNOSIS — R260 Ataxic gait: Secondary | ICD-10-CM

## 2018-04-26 DIAGNOSIS — M542 Cervicalgia: Secondary | ICD-10-CM

## 2018-04-26 DIAGNOSIS — Z8614 Personal history of Methicillin resistant Staphylococcus aureus infection: Secondary | ICD-10-CM

## 2018-04-26 DIAGNOSIS — G8929 Other chronic pain: Secondary | ICD-10-CM

## 2018-04-26 DIAGNOSIS — M47819 Spondylosis without myelopathy or radiculopathy, site unspecified: Secondary | ICD-10-CM

## 2018-04-26 DIAGNOSIS — M6250 Muscle wasting and atrophy, not elsewhere classified, unspecified site: Secondary | ICD-10-CM

## 2018-04-26 DIAGNOSIS — I519 Heart disease, unspecified: Secondary | ICD-10-CM

## 2018-04-27 ENCOUNTER — Ambulatory Visit (INDEPENDENT_AMBULATORY_CARE_PROVIDER_SITE_OTHER): Payer: Self-pay | Admitting: NEUROSURGERY

## 2018-04-27 ENCOUNTER — Encounter (INDEPENDENT_AMBULATORY_CARE_PROVIDER_SITE_OTHER): Payer: Self-pay | Admitting: NEUROSURGERY

## 2018-04-27 NOTE — Nursing Note (Signed)
I spoke with Edwin [name] today.  He states he was unable to complete the MRI due to severe claustrophobia.  The medication (Ativan) prescribed was "not strong enough".  He is scheduled for another MRI next week but without stronger medication will be unable to complete.    He feels he should be "put to sleep" to have the MRIs done, as in the past.  I advised that Dr. Everardo Beals will unlikely order IV sedation for an MRI, or prescribe a stronger anti-anxiety medication, due to his existing pulmonary & cardiac history.  I advised that  I will review this with Dr. Everardo Beals when he returns to the office next week and advise.    I  recommended he contact his PCP for recommendations also as he is managing his chronic conditions.

## 2018-04-27 NOTE — Telephone Encounter (Signed)
Notified the patient that we are not able to prescribe anything stronger and that per our previous phone conversation he may want to reach out to his PCP for something stronger

## 2018-04-27 NOTE — Telephone Encounter (Signed)
-----   Message from Thornton Dales., PA-C sent at 04/27/2018 12:28 PM EST -----  Regarding: RE: Edwin [name]  ----- Message from Theresia Lo Obrad sent at 04/27/2018 11:10 AM EST -----  Edwin [name]    Pt called and stated that he was not able to complete the MRI because of panic. He stated that the lorazepam did not help. He stated that he has another MRI scheduled and would like to know if something stronger will be prescribed otherwise he stated that there is no point in him trying to have it done. Please call to discuss. Thanks

## 2018-05-01 ENCOUNTER — Ambulatory Visit: Payer: 59 | Attending: Primary Podiatric Medicine | Admitting: Primary Podiatric Medicine

## 2018-05-01 ENCOUNTER — Encounter (RURAL_HEALTH_CENTER): Payer: Self-pay | Admitting: Primary Podiatric Medicine

## 2018-05-01 VITALS — HR 96 | Temp 98.3°F | Resp 17 | Wt 215.2 lb

## 2018-05-01 DIAGNOSIS — M79671 Pain in right foot: Secondary | ICD-10-CM

## 2018-05-01 DIAGNOSIS — B351 Tinea unguium: Secondary | ICD-10-CM

## 2018-05-01 DIAGNOSIS — M21621 Bunionette of right foot: Secondary | ICD-10-CM

## 2018-05-01 DIAGNOSIS — M21372 Foot drop, left foot: Secondary | ICD-10-CM

## 2018-05-01 DIAGNOSIS — L851 Acquired keratosis [keratoderma] palmaris et plantaris: Secondary | ICD-10-CM

## 2018-05-01 DIAGNOSIS — M79672 Pain in left foot: Secondary | ICD-10-CM

## 2018-05-01 DIAGNOSIS — M21622 Bunionette of left foot: Secondary | ICD-10-CM

## 2018-05-01 DIAGNOSIS — E1142 Type 2 diabetes mellitus with diabetic polyneuropathy: Secondary | ICD-10-CM

## 2018-05-01 NOTE — Progress Notes (Signed)
Subjective:    Patient ID: Michael [name] is a 63 y.o. male.    Patient with a history of diabetes hypertension TIA spinal stenosis CAD and left-sided foot drop was seen for exam and treatment of his lower extremity.  Patient relates that he had a black spot on the sub-left toe but eventually did resolve.  Patient has difficulty with dystrophic thick nails which are difficult for him to manage with a previous history of incurvation over these great toenails.  Complains of hyperkeratotic lesion on these plantar feet as well.  Does have an AFO for this left side.      The following portions of the patient's history were reviewed and updated as appropriate: allergies, current medications, past family history, past medical history, past social history, past surgical history and problem list.    Review of Systems   Constitutional: Negative.    Respiratory: Negative.    Cardiovascular: Negative.    Musculoskeletal: Negative.    Skin: Negative.    Neurological: Positive for numbness.   All other systems reviewed and are negative.    Pulse 96, temperature 98.3 F (36.8 C), temperature source Tympanic, resp. rate 17, weight 97.6 kg (215 lb 3.2 oz), SpO2 97 %.          Objective:    Physical Exam  Patient with warm dry supple skin over this lower extremity.  Patient with nails that are thick dystrophic discolored and elongated x10 with some incurvation over these great toenails but stable today.  Patient with hyperkeratotic lesion sub-fifth thumb bilaterally with mild subungual hematoma on this left side.  Upon debridement this is stable and closed.  Patient with decreased palpable pulses without ischemic changes or claudication.  Patient with protective sensation decreases lower leg with a 5.0 7 monofilament with numbness throughout.  Patient with prominent fifth metatarsal over this bilateral lower lower extremity.      Assessment:       1. Diabetic peripheral neuropathy associated with type 2 diabetes mellitus    2.  Onychomycosis    3. Acquired keratoderma    4. Tailor's bunion of both feet    5. Foot drop, left foot          Plan:       After consent we did debride the nails x10 without incident today.  We also did debride these hyperkeratotic lesions x2 sub-fifth.  Today we will order patient diabetic shoes to help support this lower extremity.  Advised to call with worsening concerns or complaints.

## 2018-05-02 ENCOUNTER — Ambulatory Visit (HOSPITAL_BASED_OUTPATIENT_CLINIC_OR_DEPARTMENT_OTHER): Payer: MEDICAID

## 2018-05-09 ENCOUNTER — Other Ambulatory Visit (INDEPENDENT_AMBULATORY_CARE_PROVIDER_SITE_OTHER): Payer: Self-pay | Admitting: Cardiovascular Disease

## 2018-05-09 MED ORDER — CLOPIDOGREL 75 MG TABLET
75.0000 mg | ORAL_TABLET | Freq: Every day | ORAL | 0 refills | Status: DC
Start: 2018-05-09 — End: 2018-08-21

## 2018-05-14 ENCOUNTER — Ambulatory Visit (INDEPENDENT_AMBULATORY_CARE_PROVIDER_SITE_OTHER): Payer: MEDICAID

## 2018-05-14 DIAGNOSIS — Z95818 Presence of other cardiac implants and grafts: Secondary | ICD-10-CM

## 2018-05-14 DIAGNOSIS — Z4509 Encounter for adjustment and management of other cardiac device: Secondary | ICD-10-CM

## 2018-05-15 NOTE — Procedures (Signed)
[facility] [place]  [name]  [address]  [place] [address]  [phone]       Date: 05/14/2018  Patient Name: Edwin [name]  MRN#: [id number]  DOB: 23-May-1955    ILR Device Interrogation      Model/SN Medtronic [id number]  Implant Date Sep 14, 2017  Loop Recorder      Battery     OK    Events/Episodes     None    Diagnostic Observations     1 "symptom" episode 09/14/17- NSR  Pt instructed to come in for office check.  Reminded that Carelink monitor + ILR sync daily between midnight-5 am. Changed time to 2 am-7 am based on patient's bedtime.      Follow up daily remotely

## 2018-05-16 ENCOUNTER — Other Ambulatory Visit (HOSPITAL_BASED_OUTPATIENT_CLINIC_OR_DEPARTMENT_OTHER): Payer: Self-pay

## 2018-06-01 ENCOUNTER — Encounter (INDEPENDENT_AMBULATORY_CARE_PROVIDER_SITE_OTHER): Payer: Self-pay

## 2018-06-03 ENCOUNTER — Inpatient Hospital Stay (EMERGENCY_DEPARTMENT_HOSPITAL)
Admission: EM | Admit: 2018-06-03 | Discharge: 2018-06-03 | Disposition: A | Discharge status: Home / Self Care | Payer: MEDICAID | Source: Other Acute Inpatient Hospital

## 2018-06-03 DIAGNOSIS — L0231 Cutaneous abscess of buttock: Secondary | ICD-10-CM

## 2018-06-14 ENCOUNTER — Ambulatory Visit (INDEPENDENT_AMBULATORY_CARE_PROVIDER_SITE_OTHER): Payer: MEDICAID

## 2018-06-14 ENCOUNTER — Encounter (RURAL_HEALTH_CENTER): Payer: Self-pay

## 2018-06-14 DIAGNOSIS — Z4509 Encounter for adjustment and management of other cardiac device: Secondary | ICD-10-CM

## 2018-06-14 DIAGNOSIS — I639 Cerebral infarction, unspecified: Secondary | ICD-10-CM

## 2018-06-14 DIAGNOSIS — Z95818 Presence of other cardiac implants and grafts: Secondary | ICD-10-CM

## 2018-06-25 ENCOUNTER — Other Ambulatory Visit: Payer: Self-pay

## 2018-06-25 NOTE — Procedures (Signed)
[facility] [place]  [name]  [address]  [place] [address]  [phone]       Date: 06/14/2018  Patient Name: [name]  MRN#: [id number]  DOB: November 21, 1955    Remote ILR Device Interrogation    Model/SN Medtronic [id number]  Implant Date Sep 14, 2017  Loop Recorder    Patient's ILR was checked by remote access.     31 day analysis was performed of patient's implantable loop recorder, including analysis of programmed parameters, battery, heart rhythm, counters, any observation summaries, and alerts.  Please see programmer printout for further details (scanned document).    Battery     OK    Events/Episodes     None    Diagnostic Observations     No new observations.      Follow up in 1 month, remote

## 2018-07-15 ENCOUNTER — Ambulatory Visit (INDEPENDENT_AMBULATORY_CARE_PROVIDER_SITE_OTHER): Payer: MEDICAID

## 2018-07-15 DIAGNOSIS — I639 Cerebral infarction, unspecified: Secondary | ICD-10-CM

## 2018-07-15 DIAGNOSIS — Z95818 Presence of other cardiac implants and grafts: Secondary | ICD-10-CM

## 2018-07-15 DIAGNOSIS — Z4509 Encounter for adjustment and management of other cardiac device: Secondary | ICD-10-CM

## 2018-07-23 ENCOUNTER — Other Ambulatory Visit: Payer: Self-pay

## 2018-07-23 NOTE — Procedures (Signed)
[facility] HEART & VASCULAR INST[facility] [place]  [name]  [address]  [place] [address]  [phone]       Date: 07/15/2018  Patient Name: Edwin [name]  MRN#: [id number]  DOB: [birth year]    Remote ILR Device Interrogation    Model/SN Medtronic [id number]  Implant Date Sep 14, 2017  Loop Recorder    Patient's ILR was checked by remote access.     31 day analysis was performed of patient's implantable loop recorder, including analysis of programmed parameters, battery, heart rhythm, counters, any observation summaries, and alerts.  Please see programmer printout for further details (scanned document).    Battery     OK    Events/Episodes     None    Diagnostic Observations     No new observations.      Follow up in 1 month, remote

## 2018-08-15 ENCOUNTER — Ambulatory Visit (INDEPENDENT_AMBULATORY_CARE_PROVIDER_SITE_OTHER): Payer: MEDICAID

## 2018-08-15 DIAGNOSIS — I639 Cerebral infarction, unspecified: Secondary | ICD-10-CM

## 2018-08-15 DIAGNOSIS — Z95818 Presence of other cardiac implants and grafts: Secondary | ICD-10-CM

## 2018-08-15 DIAGNOSIS — Z4509 Encounter for adjustment and management of other cardiac device: Secondary | ICD-10-CM

## 2018-08-21 ENCOUNTER — Other Ambulatory Visit: Payer: Self-pay

## 2018-08-21 ENCOUNTER — Other Ambulatory Visit (INDEPENDENT_AMBULATORY_CARE_PROVIDER_SITE_OTHER): Payer: Self-pay | Admitting: Cardiovascular Disease

## 2018-08-21 DIAGNOSIS — I679 Cerebrovascular disease, unspecified: Secondary | ICD-10-CM

## 2018-08-21 DIAGNOSIS — I639 Cerebral infarction, unspecified: Secondary | ICD-10-CM

## 2018-08-21 MED ORDER — CLOPIDOGREL 75 MG TABLET
75.0000 mg | ORAL_TABLET | Freq: Every day | ORAL | 0 refills | Status: DC
Start: 2018-08-21 — End: 2018-09-26

## 2018-08-21 NOTE — Procedures (Signed)
[facility] HEART & VASCULAR INST., [place]  [name]  [address]  [place] [address]  [phone]       Date: 08/15/2018  Patient Name: Edwin [name]  MRN#: [id number]  DOB: July 30, 1955    Remote ILR Device Interrogation    Model/SN Medtronic [id number] S  Implant Date Sep 14, 2017  Loop Recorder    Patient's ILR was checked by remote access.     31 day analysis was performed of patient's implantable loop recorder, including analysis of programmed parameters, battery, heart rhythm, counters, any observation summaries, and alerts.  Please see programmer printout for further details (scanned document).    Battery     OK    Events/Episodes     None    Diagnostic Observations     No new observations.      Follow up in 1 month, remote

## 2018-08-28 ENCOUNTER — Ambulatory Visit (RURAL_HEALTH_CENTER): Payer: Self-pay | Admitting: Primary Podiatric Medicine

## 2018-09-06 ENCOUNTER — Observation Stay
Admission: RE | Admit: 2018-09-06 | Discharge: 2018-09-07 | Disposition: A | Discharge status: Home / Self Care | Payer: 59 | Source: Other Acute Inpatient Hospital | Attending: Internal Medicine | Admitting: Internal Medicine

## 2018-09-06 ENCOUNTER — Ambulatory Visit
Admission: RE | Admit: 2018-09-06 | Discharge: 2018-09-06 | Disposition: A | Discharge status: Home / Self Care | Payer: Self-pay | Source: Ambulatory Visit | Attending: Internal Medicine | Admitting: Internal Medicine

## 2018-09-06 ENCOUNTER — Observation Stay: Payer: 59

## 2018-09-06 ENCOUNTER — Encounter: Payer: Self-pay | Admitting: Internal Medicine

## 2018-09-06 DIAGNOSIS — E785 Hyperlipidemia, unspecified: Secondary | ICD-10-CM | POA: Insufficient documentation

## 2018-09-06 DIAGNOSIS — Z885 Allergy status to narcotic agent status: Secondary | ICD-10-CM | POA: Insufficient documentation

## 2018-09-06 DIAGNOSIS — I2510 Atherosclerotic heart disease of native coronary artery without angina pectoris: Secondary | ICD-10-CM | POA: Insufficient documentation

## 2018-09-06 DIAGNOSIS — Z881 Allergy status to other antibiotic agents status: Secondary | ICD-10-CM | POA: Insufficient documentation

## 2018-09-06 DIAGNOSIS — K219 Gastro-esophageal reflux disease without esophagitis: Secondary | ICD-10-CM | POA: Insufficient documentation

## 2018-09-06 DIAGNOSIS — R0789 Other chest pain: Principal | ICD-10-CM | POA: Insufficient documentation

## 2018-09-06 DIAGNOSIS — Z833 Family history of diabetes mellitus: Secondary | ICD-10-CM | POA: Insufficient documentation

## 2018-09-06 DIAGNOSIS — G4733 Obstructive sleep apnea (adult) (pediatric): Secondary | ICD-10-CM | POA: Insufficient documentation

## 2018-09-06 DIAGNOSIS — I69398 Other sequelae of cerebral infarction: Secondary | ICD-10-CM | POA: Insufficient documentation

## 2018-09-06 DIAGNOSIS — Z7902 Long term (current) use of antithrombotics/antiplatelets: Secondary | ICD-10-CM | POA: Insufficient documentation

## 2018-09-06 DIAGNOSIS — Z955 Presence of coronary angioplasty implant and graft: Secondary | ICD-10-CM | POA: Insufficient documentation

## 2018-09-06 DIAGNOSIS — I10 Essential (primary) hypertension: Secondary | ICD-10-CM | POA: Insufficient documentation

## 2018-09-06 DIAGNOSIS — Z981 Arthrodesis status: Secondary | ICD-10-CM | POA: Insufficient documentation

## 2018-09-06 DIAGNOSIS — I252 Old myocardial infarction: Secondary | ICD-10-CM | POA: Insufficient documentation

## 2018-09-06 DIAGNOSIS — Z794 Long term (current) use of insulin: Secondary | ICD-10-CM | POA: Insufficient documentation

## 2018-09-06 DIAGNOSIS — F40240 Claustrophobia: Secondary | ICD-10-CM | POA: Insufficient documentation

## 2018-09-06 DIAGNOSIS — I200 Unstable angina: Secondary | ICD-10-CM | POA: Diagnosis present

## 2018-09-06 DIAGNOSIS — Z823 Family history of stroke: Secondary | ICD-10-CM | POA: Insufficient documentation

## 2018-09-06 DIAGNOSIS — E1142 Type 2 diabetes mellitus with diabetic polyneuropathy: Secondary | ICD-10-CM | POA: Insufficient documentation

## 2018-09-06 DIAGNOSIS — Z89022 Acquired absence of left finger(s): Secondary | ICD-10-CM | POA: Insufficient documentation

## 2018-09-06 DIAGNOSIS — Z9641 Presence of insulin pump (external) (internal): Secondary | ICD-10-CM | POA: Insufficient documentation

## 2018-09-06 DIAGNOSIS — Z8249 Family history of ischemic heart disease and other diseases of the circulatory system: Secondary | ICD-10-CM | POA: Insufficient documentation

## 2018-09-06 DIAGNOSIS — E119 Type 2 diabetes mellitus without complications: Secondary | ICD-10-CM

## 2018-09-06 DIAGNOSIS — R079 Chest pain, unspecified: Secondary | ICD-10-CM

## 2018-09-06 DIAGNOSIS — I679 Cerebrovascular disease, unspecified: Secondary | ICD-10-CM

## 2018-09-06 LAB — ECG 12-LEAD
P Wave Axis: 49 deg
P-R Interval: 203 ms
Patient Age: 62 years
Q-T Interval(Corrected): 447 ms
Q-T Interval: 365 ms
QRS Axis: 28 deg
QRS Duration: 81 ms
T Axis: -10 years
Ventricular Rate: 90 //min

## 2018-09-06 LAB — COMPREHENSIVE METABOLIC PANEL
ALT: 23 U/L (ref 0–55)
AST (SGOT): 18 U/L (ref 10–42)
Albumin/Globulin Ratio: 1.25 Ratio (ref 0.80–2.00)
Albumin: 3.5 gm/dL (ref 3.5–5.0)
Alkaline Phosphatase: 64 U/L (ref 40–145)
Anion Gap: 12.3 mMol/L (ref 7.0–18.0)
BUN / Creatinine Ratio: 28.2 Ratio (ref 10.0–30.0)
BUN: 24 mg/dL — ABNORMAL HIGH (ref 7–22)
Bilirubin, Total: 4.3 mg/dL — ABNORMAL HIGH (ref 0.1–1.2)
CO2: 26 mMol/L (ref 20–30)
Calcium: 8.2 mg/dL — ABNORMAL LOW (ref 8.5–10.5)
Chloride: 104 mMol/L (ref 98–110)
Creatinine: 0.85 mg/dL (ref 0.80–1.30)
EGFR: 93 mL/min/{1.73_m2} (ref 60–150)
Globulin: 2.8 gm/dL (ref 2.0–4.0)
Glucose: 250 mg/dL — ABNORMAL HIGH (ref 71–99)
Osmolality Calculated: 288 mOsm/kg (ref 275–300)
Potassium: 4.3 mMol/L (ref 3.5–5.3)
Protein, Total: 6.3 gm/dL (ref 6.0–8.3)
Sodium: 138 mMol/L (ref 136–147)

## 2018-09-06 LAB — CBC
Hematocrit: 41.6 % (ref 39.0–52.5)
Hemoglobin: 14.6 gm/dL (ref 13.0–17.5)
MCH: 30 pg (ref 28–35)
MCHC: 35 gm/dL (ref 32–36)
MCV: 86 fL (ref 80–100)
MPV: 7.4 fL (ref 6.0–10.0)
PLT CT: 172 10*3/uL (ref 130–440)
RBC: 4.82 10*6/uL (ref 4.00–5.70)
RDW: 12.3 % (ref 11.0–14.0)
WBC: 6.1 10*3/uL (ref 4.0–11.0)

## 2018-09-06 LAB — TROPONIN I
Troponin I: <0.01 ng/mL (ref 0.00–0.02)
Troponin I: <0.01 ng/mL (ref 0.00–0.02)
Troponin I: 0.01 ng/mL (ref 0.00–0.02)

## 2018-09-06 LAB — VH DEXTROSE STICK GLUCOSE
Glucose POCT: 198 mg/dL — ABNORMAL HIGH (ref 71–99)
Glucose POCT: 268 mg/dL — ABNORMAL HIGH (ref 71–99)
Glucose POCT: 227 mg/dL — ABNORMAL HIGH (ref 71–99)

## 2018-09-06 LAB — HEMOGLOBIN A1C: Hgb A1C, %: 8.8 %

## 2018-09-06 MED ORDER — VH BASAL INSULIN FROM PATIENT OWN INSULIN PUMP
1.00 [IU] | Status: DC
Start: 2018-09-06 — End: 2018-09-07
  Administered 2018-09-06: 16:00:00 60 [IU] via SUBCUTANEOUS
  Filled 2018-09-06 (×2): qty 100

## 2018-09-06 MED ORDER — GLUCAGON 1 MG IJ SOLR (WRAP)
1.00 mg | INTRAMUSCULAR | Status: DC | PRN
Start: 2018-09-06 — End: 2018-09-07

## 2018-09-06 MED ORDER — NALOXONE HCL 0.4 MG/ML IJ SOLN (WRAP)
0.40 mg | INTRAMUSCULAR | Status: DC | PRN
Start: 2018-09-06 — End: 2018-09-07

## 2018-09-06 MED ORDER — FAMOTIDINE 20 MG PO TABS
20.00 mg | ORAL_TABLET | Freq: Two times a day (BID) | ORAL | Status: DC
Start: 2018-09-06 — End: 2018-09-07
  Administered 2018-09-06 – 2018-09-07 (×2): 20 mg via ORAL
  Filled 2018-09-06 (×3): qty 1

## 2018-09-06 MED ORDER — METOPROLOL TARTRATE 25 MG PO TABS
25.0000 mg | ORAL_TABLET | Freq: Two times a day (BID) | ORAL | Status: DC
Start: 2018-09-06 — End: 2018-09-07
  Administered 2018-09-06 – 2018-09-07 (×2): 25 mg via ORAL
  Filled 2018-09-06 (×4): qty 1

## 2018-09-06 MED ORDER — VH BOLUS INSULIN FROM PATIENT OWN INSULIN PUMP
1.00 [IU] | Status: DC | PRN
Start: 2018-09-06 — End: 2018-09-07
  Filled 2018-09-06: qty 100

## 2018-09-06 MED ORDER — DEXTROSE 10 % IV BOLUS
125.00 mL | INTRAVENOUS | Status: DC | PRN
Start: 2018-09-06 — End: 2018-09-07

## 2018-09-06 MED ORDER — NITROGLYCERIN 2 % TD OINT
0.50 [in_us] | TOPICAL_OINTMENT | TRANSDERMAL | Status: DC
Start: 2018-09-06 — End: 2018-09-07
  Administered 2018-09-06 – 2018-09-07 (×2): 0.5 [in_us] via TOPICAL
  Filled 2018-09-06 (×6): qty 1

## 2018-09-06 MED ORDER — CLOPIDOGREL BISULFATE 75 MG PO TABS
75.00 mg | ORAL_TABLET | Freq: Every day | ORAL | Status: DC
Start: 2018-09-06 — End: 2018-09-07
  Administered 2018-09-06 – 2018-09-07 (×2): 75 mg via ORAL
  Filled 2018-09-06 (×2): qty 1

## 2018-09-06 MED ORDER — VH PATIENT OWN INSULIN PUMP
Status: DC
Start: 2018-09-06 — End: 2018-09-07
  Filled 2018-09-06: qty 1

## 2018-09-06 MED ORDER — ENOXAPARIN SODIUM 40 MG/0.4ML SC SOLN
40.00 mg | SUBCUTANEOUS | Status: DC
Start: 2018-09-06 — End: 2018-09-07
  Administered 2018-09-06: 16:00:00 40 mg via SUBCUTANEOUS
  Filled 2018-09-06 (×2): qty 0.4

## 2018-09-06 MED ORDER — VH BOLUS INSULIN FROM PATIENT OWN INSULIN PUMP
1.00 [IU] | Freq: Before meals | Status: DC
Start: 2018-09-06 — End: 2018-09-07
  Administered 2018-09-06: 18:00:00 10 [IU] via SUBCUTANEOUS
  Administered 2018-09-06: 21:00:00 2.6 [IU] via SUBCUTANEOUS
  Administered 2018-09-07: 10:00:00 12.5 [IU] via SUBCUTANEOUS
  Administered 2018-09-07: 03:00:00 1.7 [IU] via SUBCUTANEOUS
  Filled 2018-09-06 (×8): qty 100

## 2018-09-06 MED ORDER — ACETAMINOPHEN 325 MG PO TABS
650.0000 mg | ORAL_TABLET | ORAL | Status: DC | PRN
Start: 2018-09-06 — End: 2018-09-07
  Administered 2018-09-06 – 2018-09-07 (×2): 650 mg via ORAL
  Filled 2018-09-06 (×2): qty 2

## 2018-09-06 MED ORDER — POLYETHYLENE GLYCOL 3350 17 G PO PACK
17.00 g | PACK | Freq: Every day | ORAL | Status: DC
Start: 2018-09-06 — End: 2018-09-07
  Filled 2018-09-06 (×2): qty 1

## 2018-09-06 MED ORDER — ASPIRIN 81 MG PO CHEW
81.00 mg | CHEWABLE_TABLET | Freq: Every day | ORAL | Status: DC
Start: 2018-09-06 — End: 2018-09-07
  Administered 2018-09-07: 11:00:00 81 mg via ORAL
  Filled 2018-09-06 (×2): qty 1

## 2018-09-06 MED ORDER — ATORVASTATIN CALCIUM 40 MG PO TABS
80.0000 mg | ORAL_TABLET | Freq: Every day | ORAL | Status: DC
Start: 2018-09-06 — End: 2018-09-07
  Administered 2018-09-06 – 2018-09-07 (×2): 80 mg via ORAL
  Filled 2018-09-06 (×2): qty 2

## 2018-09-06 MED ORDER — SODIUM CHLORIDE (PF) 0.9 % IJ SOLN
3.00 mL | Freq: Three times a day (TID) | INTRAMUSCULAR | Status: DC
Start: 2018-09-06 — End: 2018-09-07
  Administered 2018-09-06 – 2018-09-07 (×2): 3 mL via INTRAVENOUS

## 2018-09-06 MED ORDER — PREGABALIN 25 MG PO CAPS
50.00 mg | ORAL_CAPSULE | Freq: Three times a day (TID) | ORAL | Status: DC
Start: 2018-09-06 — End: 2018-09-07
  Administered 2018-09-06 – 2018-09-07 (×3): 50 mg via ORAL
  Filled 2018-09-06 (×3): qty 2

## 2018-09-06 MED ORDER — ONDANSETRON 4 MG PO TBDP
4.00 mg | ORAL_TABLET | Freq: Three times a day (TID) | ORAL | Status: DC | PRN
Start: 2018-09-06 — End: 2018-09-07

## 2018-09-06 MED ORDER — NITROGLYCERIN 0.4 MG SL SUBL
0.40 mg | SUBLINGUAL_TABLET | SUBLINGUAL | Status: DC | PRN
Start: 2018-09-06 — End: 2018-09-07
  Administered 2018-09-07 (×2): 0.4 mg via SUBLINGUAL
  Filled 2018-09-06: qty 25

## 2018-09-06 MED ORDER — HYDROXYZINE HCL 25 MG PO TABS
25.00 mg | ORAL_TABLET | Freq: Every evening | ORAL | Status: DC
Start: 2018-09-06 — End: 2018-09-07
  Administered 2018-09-06: 21:00:00 25 mg via ORAL
  Filled 2018-09-06 (×2): qty 1

## 2018-09-06 MED ORDER — ONDANSETRON HCL 4 MG/2ML IJ SOLN
4.00 mg | Freq: Three times a day (TID) | INTRAMUSCULAR | Status: DC | PRN
Start: 2018-09-06 — End: 2018-09-07

## 2018-09-06 MED ORDER — ATORVASTATIN 40 MG TABLET
80.00 mg | ORAL_TABLET | ORAL | Status: DC
Start: 2018-09-08 — End: 2018-09-06

## 2018-09-06 MED ORDER — GENERIC EXTERNAL MEDICATION
0.40 mg | Status: DC
Start: ? — End: 2018-09-06

## 2018-09-06 MED ORDER — PREGABALIN 25 MG CAPSULE
50.00 mg | ORAL_CAPSULE | ORAL | Status: DC
Start: 2018-09-07 — End: 2018-09-06

## 2018-09-06 MED ORDER — ENOXAPARIN 40 MG/0.4 ML SUBCUTANEOUS SYRINGE
40.00 mg | INJECTION | SUBCUTANEOUS | Status: DC
Start: 2018-09-07 — End: 2018-09-06

## 2018-09-06 MED ORDER — METOPROLOL TARTRATE 25 MG TABLET
25.00 mg | ORAL_TABLET | ORAL | Status: DC
Start: 2018-09-07 — End: 2018-09-06

## 2018-09-06 MED ORDER — ASPIRIN 81 MG CHEWABLE TABLET
81.00 mg | CHEWABLE_TABLET | ORAL | Status: DC
Start: 2018-09-08 — End: 2018-09-06

## 2018-09-06 MED ORDER — NITROGLYCERIN 2 % TRANSDERMAL OINTMENT - PACKET
0.50 [in_us] | TOPICAL_OINTMENT | TRANSDERMAL | Status: DC
Start: 2018-09-06 — End: 2018-09-06

## 2018-09-06 MED ORDER — FAMOTIDINE 20 MG TABLET
20.00 mg | ORAL_TABLET | ORAL | Status: DC
Start: 2018-09-07 — End: 2018-09-06

## 2018-09-06 MED ORDER — DEXTROSE 10 % IN WATER (D10W) INTRAVENOUS SOLUTION
125.00 mL | INTRAVENOUS | Status: DC
Start: ? — End: 2018-09-06

## 2018-09-06 MED ORDER — GLUCAGON (HUMAN RECOMBINANT) 1 MG SOLUTION FOR INJECTION
1.00 mg | INTRAMUSCULAR | Status: DC
Start: ? — End: 2018-09-06

## 2018-09-06 MED ORDER — GENERIC EXTERNAL MEDICATION
1.00 [IU] | Status: DC
Start: 2018-09-07 — End: 2018-09-06

## 2018-09-06 MED ORDER — GENERIC EXTERNAL MEDICATION
Status: DC
Start: ? — End: 2018-09-06

## 2018-09-06 MED ORDER — CLOPIDOGREL 75 MG TABLET
75.00 mg | ORAL_TABLET | ORAL | Status: DC
Start: 2018-09-08 — End: 2018-09-06

## 2018-09-06 MED ORDER — GENERIC EXTERNAL MEDICATION
1.00 [IU] | Status: DC
Start: ? — End: 2018-09-06

## 2018-09-06 MED ORDER — ACETAMINOPHEN 325 MG TABLET
650.00 mg | ORAL_TABLET | ORAL | Status: DC | PRN
Start: ? — End: 2018-09-06

## 2018-09-06 MED ORDER — POLYETHYLENE GLYCOL 3350 17 GRAM ORAL POWDER PACKET
17.00 g | ORAL | Status: DC
Start: 2018-09-08 — End: 2018-09-06

## 2018-09-06 MED ORDER — HYDROXYZINE HCL 25 MG TABLET
25.00 mg | ORAL_TABLET | ORAL | Status: DC
Start: 2018-09-07 — End: 2018-09-06

## 2018-09-06 MED ORDER — SODIUM CHLORIDE 0.9 % INJECTION SOLUTION
3.00 mL | Freq: Three times a day (TID) | INTRAMUSCULAR | Status: DC
Start: 2018-09-07 — End: 2018-09-06

## 2018-09-06 MED ORDER — NITROGLYCERIN 0.4 MG SUBLINGUAL TABLET
0.40 mg | SUBLINGUAL_TABLET | SUBLINGUAL | Status: DC
Start: ? — End: 2018-09-06

## 2018-09-06 NOTE — Progress Notes (Signed)
Pt. Admitted today. Pt. Oriented to unit. Pt. Completed testing this pm. Pt/ up in bed. No acute distress noted. Call bell in reach of pt.

## 2018-09-06 NOTE — Consults (Signed)
[facility]  Consultation Note    Date Time: 09/06/18 2:12 PM  Patient Name: Michael [name], Michael [name]  MRN#: [id number]  DOB: 05/16/1955  Consulting Physician: Ilda Mori, MD    Reason for Consult:   The patient was seen at the request of Ilda Mori, MD for the evaluation of chest pain.    History:   Curt Oatis is a 63 y.o. male with a h/o CAD s/p remote PCI x2 s/p multiple recent stress tests in 2018 and 2019 which were low risk who p/w "not feeling well" which started last night around 2 am when he awoke and felt left facial numbness and lower sternal, focal, sharp chest pain that was worse with a deep breath and radiated around to his back.  He states the pain lasted several hours at its worst and is still present to a small degree now.  He went to [place] and was transferred here.  Troponin negative x2, ECG NSR with no ST changes and overall unchanged from prior.  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  He has been taking his meds as prescribed.    Past Medical History:     Past Medical History:   Diagnosis Date    CAD (coronary artery disease)     Cellulitis of finger of left hand 10/22/14    Diabetes mellitus     Gastroesophageal reflux disease     Hernia     Hyperlipidemia     Hypertension     Myocardial infarction     Neuropathy     diabetic    Presence of stent in coronary artery     4 STENTS    Sleep apnea     TIA (transient ischemic attack)     Type 2 diabetes mellitus, controlled        Past Surgical History:     Past Surgical History:   Procedure Laterality Date    AMPUTATION, FINGER Left 02/26/2015    Procedure: AMPUTATION, FINGER;  Surgeon: Lujean Rave, MD;  Location: [facility];  Service: Orthopedics;  Laterality: Left;  Amputation left long finger    CARPAL TUNNEL RELEASE Left     05/30/17    CERVICAL FUSION      CIRCUMCISION, > 13 YEARS,  ADULT N/A 09/08/2014    Procedure: CIRCUMCISION, > 13 YEARS, ADULT;  Surgeon: Ellwood Handler, MD;  Location: [facility];  Service: Urology;  Laterality: N/A;  CIRCUMCISION    CORONARY ANGIOPLASTY WITH STENT PLACEMENT      2 stents    CORONARY ANGIOPLASTY WITH STENT PLACEMENT  12/13/12    DES to OM1, 2 stents    EGD, COLONOSCOPY N/A 12/19/2017    Procedure: EGD, COLONOSCOPY;  Surgeon: Salena Saner, MD;  Location: [facility];  Service: Gastroenterology;  Laterality: N/A;    LUMBAR LAMINECTOMY      RELEASE, CUBITAL TUNNEL Left 05/30/2017       Problem List:   Active Problems:    Unstable angina      Allergies:     Allergies   Allergen Reactions    Gabapentin      "Makes crazy/hyper"    Glucophage [Metformin Hydrochloride] Nausea And Vomiting    Keflex [Cephalexin] Nausea And Vomiting    Morphine      Hallucinations    Victoza [Liraglutide] Nausea And Vomiting  Medications:     Current Facility-Administered Medications   Medication Dose Route Frequency    aspirin  81 mg Oral Daily    atorvastatin  80 mg Oral Daily    clopidogrel  75 mg Oral Daily    enoxaparin  40 mg Subcutaneous Q24H    famotidine  20 mg Oral BID    hydrOXYzine  25 mg Oral QHS    insulin (patient own pump): NUTRITION / CORRECTION  1-100 Units Subcutaneous 5XDAY AC, HS & 0300    insulin (patient own pump): TOTAL DAILY BASAL units  1-100 Units Subcutaneous Q24H    metoprolol tartrate  25 mg Oral Q12H SCH    nitroglycerin  0.5 inch Topical Q6H HOLD MN    polyethylene glycol  17 g Oral Daily    pregabalin  50 mg Oral TID    sodium chloride (PF)  3 mL Intravenous Q8H      patient own insulin pump       Prior to Admission medications    Medication Sig Start Date End Date Taking? Authorizing Provider   hydrOXYzine (ATARAX) 25 MG tablet Take 25 mg by mouth nightly   Yes [provider]   Insulin Aspart (INSULIN, PATIENT OWN PUMP,: TOTAL DAILY BASAL UNITS) Inject 2.5 Units into the skin every 1 hour  Novolog   Yes [provider]   isosorbide mononitrate (IMDUR) 30 MG 24 hr tablet Take 30 mg by mouth daily   Yes [provider]   pregabalin (LYRICA) 50 MG capsule Take 50 mg by mouth 3 (three) times daily   Yes [provider]   atorvastatin (LIPITOR) 40 MG tablet Take 40 mg by mouth nightly.    [provider]   CALCIUM-MAGNESIUM-ZINC PO Take 3 tablets by mouth daily       [provider]   cetirizine (ZYRTEC) 10 MG tablet Take 10 mg by mouth daily.    [provider]   clopidogrel (PLAVIX) 75 mg tablet Take 1 tablet (75 mg total) by mouth daily. 03/09/17   Clabe Seal, MD   Dapagliflozin Propanediol (FARXIGA) 10 MG Tab Take by mouth.    [provider]   glipiZIDE XL (GLUCOTROL XL) 5 MG 24 hr tablet Take 5 mg by mouth every morning with breakfast.    [provider]   hydroCHLOROthiazide (MICROZIDE) 12.5 MG capsule Take 12.5 mg by mouth daily    [provider]   Hydrocodone-Acetaminophen 7.5-300 MG Tab Take by mouth every 4 (four) hours as needed.    [provider]   hydrOXYzine (VISTARIL) 50 MG capsule Take 1 capsule (50 mg total) by mouth nightly.  Patient taking differently: Take 25 mg by mouth nightly    03/19/13   Brayton Caves, MD   insulin aspart (NOVOLOG) 100 UNIT/ML injection Inject 30 Units into the skin 3 (three) times daily with meals       [provider]   insulin aspart (NOVOLOG) 100 UNIT/ML injection Inject 35 Units into the skin nightly    [provider]   Insulin Degludec (TRESIBA FLEXTOUCH) 100 UNIT/ML Solution Pen-injector Inject 80 Unit into the skin nightly       [provider]   isosorbide mononitrate (IMDUR) 120 MG 24 hr tablet Take 1 tablet (120 mg total) by mouth Once a day at 6:00am 02/18/18   Colonel Bald, MD   lidocaine (LIDODERM) 5 % Place 1 patch onto the skin 2 (two) times daily Remove & Discard patch  within 12 hours or as directed by MD       [provider]   meclizine (ANTIVERT) 12.5 MG tablet Take by mouth. 12/13/16   [provider]   metoclopramide (REGLAN) 10 MG tablet Take 10 mg by mouth 4 (four) times daily    [provider]   metoprolol succinate XL (TOPROL-XL) 50 MG 24 hr tablet Take 50 mg by mouth daily    [provider]   metoprolol tartrate (LOPRESSOR) 50 MG tablet Take 50 mg by mouth daily.        [provider]   Multiple Vitamin (ONE-A-DAY MENS) Tab Take 1 tablet by mouth every morning.    [provider]   nitroglycerin (NITROSTAT) 0.4 MG SL tablet Place 0.4 mg under the tongue every 5 (five) minutes as needed.    [provider]   pantoprazole (PROTONIX) 40 MG tablet Take 40 mg by mouth daily.    [provider]   pregabalin (LYRICA) 75 MG capsule Take 50 mg by mouth 3 (three) times daily.        [provider]   traMADol (ULTRAM) 50 MG tablet Take 50 mg by mouth 3 (three) times daily as needed for Pain       [provider]   vitamin B-12 (CYANOCOBALAMIN) 1000 MCG tablet Take 1,000 mcg by mouth daily.    [provider]       Family History:     Family History   Problem Relation Age of Onset    Heart disease Mother     Stroke Mother     Diabetes Mother     Heart attack Mother     Heart disease Father     Stroke Father     Heart attack Father     Diabetes Sister        Social History:     Social History     Socioeconomic History    Marital status: Single     Spouse name: Not on file    Number of children: Not on file    Years of education: Not on file    Highest education level: Not on file   Occupational History    Not on file   Social Needs    Financial resource strain: Not on file    Food insecurity:     Worry: Not on file     Inability: Not on file    Transportation needs:     Medical: Not on file     Non-medical: Not on file   Tobacco Use    Smoking status: Never Smoker    Smokeless tobacco: Never Used   Substance and  Sexual Activity    Alcohol use: Yes     Comment: once a year maybe    Drug use: No    Sexual activity: Not on file   Lifestyle    Physical activity:     Days per week: Not on file     Minutes per session: Not on file    Stress: Not on file   Relationships    Social connections:     Talks on phone: Not on file     Gets together: Not on file     Attends religious service: Not on file     Active member of club or organization: Not on file     Attends meetings of clubs or organizations: Not on file     Relationship  status: Not on file    Intimate partner violence:     Fear of current or ex partner: Not on file     Emotionally abused: Not on file     Physically abused: Not on file     Forced sexual activity: Not on file   Other Topics Concern    Not on file   Social History Narrative    Not on file       Review of Systems:     All systems reviewed and all others negative other than those noted in the HPI.    Physical Exam:     Visit Vitals  BP 118/75   Pulse 97   Temp 97.7 F (36.5 C) (Temporal)   Resp 17   Ht 1.727 m (5\' 8" )   Wt 99.2 kg (218 lb 11.1 oz)   SpO2 98%   BMI 33.25 kg/m     General/Constitutional: no acute distress, overweight  HEENT: mask on  Neck: supple, no JVD, no bruits  Heart/Cardiovascular:   RRR  no murmurs/rubs/gallops  2+ radial pulses (symmetric)  2+ DP pulses  Respiratory/Lungs: non-labored, clear to auscultation throughout  Abdominal/Gastrointestinal: soft, nontender  Extremities: warm and well-perfused, no edema x4  Skin: no rash  Neurological: alert and oriented x3, grossly nonfocal  Psych: calm    Labs reviewed    Prior Cardiovascular Workup:     LHC 10/21/11 (Dr. Duffy Rhody)  1. Prior right coronary stent which remains patent with minimal luminal encroachment.  2. 3-vessel atherosclerosis without significant obstructive disease.  3. Normal overall systolic function with focal inferior basal hypokinesis  RECOMMENDATIONS: Medical management with vigorous secondary prevention.    Echo  10/16/12  1. The left ventricle is thickened in a fashion consistent with mild concentric hypertrophy. Global systolic function was normal. The left ventricular ejection fraction is estimated to be 55-60%.   2. Diffuse thickening (sclerosis) without reduced excursion in the aortic valve. There is evidence of trivial (trace) aortic regurgitation.   3. There is evidence of trivial (trace) mitral regurgitation. There is evidence of sclerosis of the mitral valve.   4. There is evidence of trace (trivial) tricuspid regurgitation. The estimated right ventricular systolic pressure is 18 mmHg.   5. There is no evidence of pericardial effusion.   6. There is no previous echocardiogram available for comparison.     ABI/PVR 10/16/12  1. Normal bilateral ABIs at rest and post exercise.   2. Limited PVRs at ankles and toe pressures are normal.    Nuclear stress 12/03/12  1. LV systolic function appears moderately reduced   2. Calculated LVEF 38%   3. Gated images show moderate global hypokinesis.   4. Perfusion images show a large area of ischemia from base to distal in the infero-lateral wall with atleast moderate reversibility.  5. High risk study due to size of defect and reduced EF.    LHC 12/13/12  1. 99% proximal OM2 stenosis status post percutaneous balloon angioplasty followed by drug-eluting stent placement, Xience 3.25 x 23 mm.  2. Prior stents in RCA and proximal left circumflex noted to be patent.    Nuclear stress 03/18/13  1. Comparing the baseline and the stress images, no significant fixed or reversible perfusion defect noted.  2. Gated imaging shows normal EF.  3. Low risk Lexiscan nuclear stress test.    LHC 03/19/13  1. Patent stents in the RCA and circumflex marginal.  2. Significant 1st diagonal branch disease.  3. Normal LV  systolic and diastolic function.    Nuclear stress 04/12/14  Unremarkable Lexiscan Cardiolite stress test without evidence for infarct or ischemia, and visually normal-appearing left  ventricular ejection fraction.    Sleep study 08/20/15  1. Moderate sleep apnea with significant desaturations.  2. RECOMMENDATION: For the patient to undergo a full night CPAP titration for the moderate sleep apnea with desaturations.    Nuclear Stress 04/28/16   Risk Assessment: Low    There is a small, moderate in severity, predominantly fixed apical lateral wall perfusion defect with normal wall motion and     thickening on gated images.  This is most consistent with artifact (possibly apical thinning artifact) with small scar being less     likely.  No ischemia is seen.    Normal left ventricular systolic function.      Nuclear Stress 11/19/16  Risk Assessment: Low   Abnormal but low risk lexiscan nuclear stress test.     Echo 03/07/17  1.The left ventricular cavity size is normal. The left ventricular wall thicknesses are normal. The leftventricular systolic function is normal. The left ventricular ejection fraction is normal, estimated at 55-60%.There are no regional wall motion abnormalities present. There is normal diastolic function.  2.No patent foramen ovale is demonstrated by agitated saline injection.   3.There is no significant valvular heart disease.    Event monitor 04/24/17- 05/25/17  No symptoms reports. There were strips showing sinus rhythm with PVC. There are no strips to review.    Sleep study 06/15/2017  Severe sleep apnea. Normalizing with a continuous positive airway pressure of 8.    Nuclear stress 07/25/17  Baseline EKG shows NSR with T wave abnormality. Lexiscan EKG shows no significant EKG changes from baseline.  Perfusion images show a small fixed defect of mild severity in the mid inferior wall with no clinically significant ischemia seen and normal wall motion analysis. SSS 1, SDS 0.  Ejection Fraction: 56%  Abnormal, but low risk study.  Compared to prior study report from 11/19/2016, images now do not show any ischemia.    ABI/PVR 08/09/17  Resting and post exercise ABI  suggestive of no significant arterial insufficiency in b/l LE. Toe pressures adequate for healing.    30 Day Event Monitor 07/2017  I have several strips for review, which show sinus tachycardia with PVC and sinus tachycardia.These were all auto-triggered events with no reported symptoms.There is some  baseline artifact on the strips as well.I have no other strips for review. No high-grade arrhythmias seen as detailed above.    ILR implantation 09/14/17    Device Checks 09/14/17 - 04/14/19  No events or episodes    Device Check 05/14/18  1 "symptom" episode 09/14/17- NSR  Pt instructed to come in for office check.  Reminded that Carelink monitor + ILR sync daily between midnight-5 am. Changed time to 2 am-7 am based on patient's bedtime    Device Checks 06/14/18 - 08/15/18  No events or episodes      Assessment:   Ishmael Berkovich is 63 y.o. male with    1. Chest pain- atypical, negative troponins thus far despite ongoing pain and unchanged ECG; if troponins remain negative no further cardiac evaluation indicated at this time; multiple recent stress tests from 2018 and 2019 all low risk  2. CAD s/p remote PCI  3. Cerebrovasculardisease: Hx of TIA, per Dr. Verne Spurr report  4. HTN w/mild LVH  5. Hyperlipidemia  6. OSA  7. DM, II  Recommendations:     1. TTE pending  2. If troponins remain negative no further cardiac evaluation indicated at this time; multiple recent stress tests from 2018 and 2019 all low risk  3. Continue cardiac medications    Thank you for consulting and allowing [place] to participate in this patient's care! We will follow along while the patient is in the hospital.    Seward Speck, MD, [facility], RPVI  Interventional Cardiology  Pager: [phone]  After 5 pm on weekdays and on weekends please contact the on-call physician for [facility]  [address], [address]  [place], [place] [address]  Tel [phone]  Fax [phone]

## 2018-09-06 NOTE — H&P (Signed)
Medicine History & Physical   [facility]  [employer]   Patient Name: Michael [name], Michael [name] LOS: 1 days   Attending Physician: Ilda Mori, MD PCP: Clifton James, PA      Assessment and Plan:                                                              Unstable angina context of CAD status post prior PCI's  --Currently has minimal chest discomfort and is hemodynamically stable  --We will get serial cardiac markers   --His last MPI/ nuclear stress test was in  07/2017 ;  low risk assessment and normal of EF of 56%  --Start on beta-blocker, statin., antiplatelet therapy  and nitroglycerin  --Telemetry monitoring  --Cards consult and follow-up recommendation subsequent work-up including noninvasive versus invasive ischemic work-up inpatient versus outpatient; to be determined based on his clinical course    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  --Has recurrence of left facial sensory deficit with trigeminal nerve distribution  -- No Other cranial nerve involvement or other focal neurologic deficit  --We will get a CT scan of the head  --Due to severe claustrophobia previous attempts with sedation was unsuccessful for MRI of the brain  --Had previous work-up and cardiac loop recorder in situ,   --We will continue the above management for acute coronary syndrome      Diabetes mellitus type 2, on insulin pump, with peripheral neuropathy  --Continue to failure to control his diabetes on separate insulin is now on insulin pump which was implanted not too long ago  --Patient is able to manage the insulin pump  --We will monitor blood glucose  --Resume Lyrica for peripheral neuropathy    Essential hypertension  --Controlled   -- currently on metoprolol    Dyslipidemia  --Start on statin      DVT PPx: Lovenox   Dispo: Observation  Code: full code     History of Presenting Illness                                CC: Chest pain  Michael [name] is a 63 y.o. male patient  with past medical history of CAD status post PCI x4 ( the last in [address]), hypertension, hyperlipidemia, type 2 diabetes mellitus on insulin pump with peripheral neuropathy, TIA versus cryptogenic stroke ( s/p planned cardiac loop recorder), presented to [facility] as a transfer from [facility] for initially was seen with aforementioned complaint.  Patient stated he was not feeling well all day yesterday and this morning around 2:00 experiencing midsternal chest pain rating to his back in the left side of the shoulder and neck.  The pain continues throughout this morning occurring at rest anda at times increased severity up to 9/10 with occasional sharp pain and the most part pressure-like chest discomfort which is constant..  No aggravating relieving factors.  This chest pain is accompanied with shortness of breath nausea and dizziness.   Patient took 3 nitroglycerin sublingually with minimal improvement in symptoms and after he went to ER of OSH, he was given 4 baby aspirin and Nitropaste was placed;  subsequently had chest pain decreased and  has very minimal discomfort at this time.  No ST change for acute ischemia and troponin times was negative.  Patient also states started feeling numbness on his left face and this symptom is similar to what he had about a year ago where he had work-ups include neuroimaging studies and had cardiac loop recorder placed with follow-up with cardiology.  Per record reviewed MRA and MRI could not be completed at that time or subsequently , due to severe claustrophobia even with sedation.  Patient denies any weakness or new numbness in extremities other than his baseline peripheral neuropathy from diabetes.  He denies any headache or double vision.  Denies any speech abnormality.  There is similar numbness previously subsided after a few weeks or months.  Denies any fever or chills.  Denies any sore throat runny nose muscle aches or pain denies any  headache.    Past Medical History:   Diagnosis Date    CAD (coronary artery disease)     Cellulitis of finger of left hand 10/22/14    Diabetes mellitus     Gastroesophageal reflux disease     Hernia     Hyperlipidemia     Hypertension     Myocardial infarction     Neuropathy     diabetic    Presence of stent in coronary artery     4 STENTS    Sleep apnea     TIA (transient ischemic attack)     Type 2 diabetes mellitus, controlled      Past Surgical History:   Procedure Laterality Date    AMPUTATION, FINGER Left 02/26/2015    Procedure: AMPUTATION, FINGER;  Surgeon: Lujean Rave, MD;  Location: [facility];  Service: Orthopedics;  Laterality: Left;  Amputation left long finger    CARPAL TUNNEL RELEASE Left     05/30/17    CERVICAL FUSION      CIRCUMCISION, > 13 YEARS, ADULT N/A 09/08/2014    Procedure: CIRCUMCISION, > 13 YEARS, ADULT;  Surgeon: Ellwood Handler, MD;  Location: [facility];  Service: Urology;  Laterality: N/A;  CIRCUMCISION    CORONARY ANGIOPLASTY WITH STENT PLACEMENT      2 stents    CORONARY ANGIOPLASTY WITH STENT PLACEMENT  12/13/12    DES to OM1, 2 stents    EGD, COLONOSCOPY N/A 12/19/2017    Procedure: EGD, COLONOSCOPY;  Surgeon: Salena Saner, MD;  Location: [facility];  Service: Gastroenterology;  Laterality: N/A;    LUMBAR LAMINECTOMY      RELEASE, CUBITAL TUNNEL Left 05/30/2017     Family History   Problem Relation Age of Onset    Heart disease Mother     Stroke Mother     Diabetes Mother     Heart attack Mother     Heart disease Father     Stroke Father     Heart attack Father     Diabetes Sister      Social History     Tobacco Use    Smoking status: Never Smoker    Smokeless tobacco: Never Used   Substance Use Topics    Alcohol use: Yes     Comment: once a year maybe    Drug use: No       Subjective   Review of Systems:  Review of systems as noted in HPI. Full 12 point ROS otherwise negative.          Objective    Physical Exam:  Vitals: T:97.7 F (36.5 C) (Temporal),  BP:118/75, HR:97, RR:17, SaO2:98%    1) General Appearance: Alert and oriented x 4. In no acute distress.   2) Eyes: Pink conjunctiva, anicteric sclera. Pupils are equally reactive to light.  3) ENT: Oral mucosa moist with no pharyngeal congestion, erythema or swelling.  4) Neck: Supple, with full range of motion. Trachea is central, no JVD noted  5) Chest: Clear to auscultation bilaterally, no wheezes or rhonchi.  6) CVS: normal rate and regular rhythm, with no murmurs.  7) Abdomen: Soft, non-tender, no palpable mass. Bowel sounds normal. No CVA tenderness  8) Extremities: No pitting edema, pulses palpable, no calf swelling and no gross deformity.  Previous amputated finger on the left  9) Skin: Warm, dry with normal skin turgor, no rash  10) Lymphatics: No lymphadenopathy in axillary, cervical and inguinal area.   11) Neurological: Cranial nerves , decreased sensation to light touch and pain left face involving the 3 part of the trigeminal nerves.  No facial asymmetry normal extraocular eye movements and pupils are normal size and reactive to light, and has normal speech ; rest of cranial nerves unremarkable.  Motor strength 5/5 in both upper and lower extremities, symmetrical decreased due to reflexes, stocking glove distribution sensory deficits to light touch and pain in both upper lower extremities symmetrically  12) Psychiatric: Affect is appropriate. No hallucinations.    EKG: Per my review shows normal sinus rhythm borderline T wave abnormality  Chest Xray: Per my review shows , no acute cardiopulmonary normality or filtrates    Patient Vitals for the past 12 hrs:   BP Temp Pulse Resp   09/06/18 1204 118/75 97.7 F (36.5 C) 97 17     Weight Monitoring 06/01/2017 06/27/2017 11/16/2017 02/16/2018 02/16/2018 05/01/2018 09/06/2018   Height - - - 172.7 cm - - 172.7 cm   Height Method - - - Stated - - Stated   Weight 94.484 kg 91.173 kg 95.301 kg 99.202 kg 99.2  kg 97.614 kg 99.2 kg   Weight Method - - - Standing Scale Standing Scale - Standing Scale   BMI (calculated) - - - 33.3 kg/m2 - - 33.3 kg/m2          Recent Results (from the past 24 hour(s))   Dextrose Stick Glucose    Collection Time: 09/06/18 12:30 PM   Result Value Ref Range    Glucose, POCT 198 (H) 71 - 99 mg/dL        Allergies   Allergen Reactions    Gabapentin      "Makes crazy/hyper"    Glucophage [Metformin Hydrochloride] Nausea And Vomiting    Keflex [Cephalexin] Nausea And Vomiting    Morphine      Hallucinations    Victoza [Liraglutide] Nausea And Vomiting      No results found.   Home Medications             atorvastatin (LIPITOR) 40 MG tablet     Take 40 mg by mouth nightly.     cetirizine (ZYRTEC) 10 MG tablet     Take 10 mg by mouth daily.     clopidogrel (PLAVIX) 75 mg tablet     Take 1 tablet (75 mg total) by mouth daily.     hydroCHLOROthiazide (MICROZIDE) 12.5 MG capsule     Take 12.5 mg by mouth daily     hydrOXYzine (ATARAX) 25 MG tablet     Take 25 mg by mouth nightly  Insulin Aspart (INSULIN, PATIENT OWN PUMP,: TOTAL DAILY BASAL UNITS)     Inject 2.5 Units into the skin every 1 hour Novolog     isosorbide mononitrate (IMDUR) 30 MG 24 hr tablet     Take 30 mg by mouth daily     lidocaine (LIDODERM) 5 %     Place 1 patch onto the skin 2 (two) times daily Remove & Discard patch within 12 hours or as directed by MD        metoprolol succinate XL (TOPROL-XL) 50 MG 24 hr tablet     Take 50 mg by mouth daily     nitroglycerin (NITROSTAT) 0.4 MG SL tablet     Place 0.4 mg under the tongue every 5 (five) minutes as needed.     pantoprazole (PROTONIX) 40 MG tablet     Take 40 mg by mouth daily.     pregabalin (LYRICA) 50 MG capsule     Take 50 mg by mouth 3 (three) times daily     traMADol (ULTRAM) 50 MG tablet     Take 50 mg by mouth 3 (three) times daily as needed for Pain              Flagged for Removal             CALCIUM-MAGNESIUM-ZINC PO     Take 3 tablets by mouth daily         Dapagliflozin Propanediol (FARXIGA) 10 MG Tab     Take by mouth.     glipiZIDE XL (GLUCOTROL XL) 5 MG 24 hr tablet     Take 5 mg by mouth every morning with breakfast.     Hydrocodone-Acetaminophen 7.5-300 MG Tab     Take by mouth every 4 (four) hours as needed.     hydrOXYzine (VISTARIL) 50 MG capsule     Take 1 capsule (50 mg total) by mouth nightly.     Patient taking differently:  Take 25 mg by mouth nightly        insulin aspart (NOVOLOG) 100 UNIT/ML injection     Inject 30 Units into the skin 3 (three) times daily with meals        insulin aspart (NOVOLOG) 100 UNIT/ML injection     Inject 35 Units into the skin nightly     Insulin Degludec (TRESIBA FLEXTOUCH) 100 UNIT/ML Solution Pen-injector     Inject 80 Unit into the skin nightly        isosorbide mononitrate (IMDUR) 120 MG 24 hr tablet     Take 1 tablet (120 mg total) by mouth Once a day at 6:00am     meclizine (ANTIVERT) 12.5 MG tablet     Take by mouth.     metoclopramide (REGLAN) 10 MG tablet     Take 10 mg by mouth 4 (four) times daily     metoprolol tartrate (LOPRESSOR) 50 MG tablet     Take 50 mg by mouth daily.         Multiple Vitamin (ONE-A-DAY MENS) Tab     Take 1 tablet by mouth every morning.     pregabalin (LYRICA) 75 MG capsule     Take 50 mg by mouth 3 (three) times daily.         vitamin B-12 (CYANOCOBALAMIN) 1000 MCG tablet     Take 1,000 mcg by mouth daily.         Meds given in the ED:  Medications   hydrOXYzine (ATARAX) tablet 25  mg (has no administration in time range)   pregabalin (LYRICA) capsule 50 mg (has no administration in time range)   famotidine (PEPCID) tablet 20 mg (has no administration in time range)   aspirin chewable tablet 81 mg (has no administration in time range)   dextrose (D10W) 10% bolus 125 mL (has no administration in time range)   glucagon (rDNA) (GLUCAGEN) injection 1 mg (has no administration in time range)   sodium chloride (PF) 0.9 % injection 3 mL (has no administration in time range)   naloxone (NARCAN)  injection 0.4 mg (has no administration in time range)   clopidogrel (PLAVIX) tablet 75 mg (has no administration in time range)   atorvastatin (LIPITOR) tablet 80 mg (has no administration in time range)   metoprolol tartrate (LOPRESSOR) tablet 25 mg (has no administration in time range)   nitroglycerin (NITRO-BID) 2 % ointment 0.5 inch (has no administration in time range)   nitroglycerin (NITROSTAT) SL tablet 0.4 mg (has no administration in time range)   acetaminophen (TYLENOL) tablet 650 mg (has no administration in time range)   patient own insulin pump (has no administration in time range)   insulin (patient own pump): NUTRITION / CORRECTION 1-100 Units (has no administration in time range)   insulin (patient own pump): TOTAL DAILY BASAL units 1-100 Units (has no administration in time range)   insulin (patient own pump): NUTRITION / CORRECTION 1-100 Units (has no administration in time range)   enoxaparin (LOVENOX) syringe 40 mg (has no administration in time range)   ondansetron (ZOFRAN-ODT) disintegrating tablet 4 mg (has no administration in time range)     Or   ondansetron (ZOFRAN) injection 4 mg (has no administration in time range)   polyethylene glycol (MIRALAX) packet 17 g (has no administration in time range)      Time Spent:     Obie Dredge, MD     09/06/18,1:44 PM   MRN: [id number]                                      CSN: [id number] DOB: [birth year]

## 2018-09-07 DIAGNOSIS — F40240 Claustrophobia: Secondary | ICD-10-CM

## 2018-09-07 DIAGNOSIS — I69398 Other sequelae of cerebral infarction: Secondary | ICD-10-CM

## 2018-09-07 DIAGNOSIS — E785 Hyperlipidemia, unspecified: Secondary | ICD-10-CM

## 2018-09-07 DIAGNOSIS — G4733 Obstructive sleep apnea (adult) (pediatric): Secondary | ICD-10-CM

## 2018-09-07 DIAGNOSIS — I2510 Atherosclerotic heart disease of native coronary artery without angina pectoris: Secondary | ICD-10-CM

## 2018-09-07 DIAGNOSIS — I252 Old myocardial infarction: Secondary | ICD-10-CM

## 2018-09-07 DIAGNOSIS — E1142 Type 2 diabetes mellitus with diabetic polyneuropathy: Secondary | ICD-10-CM

## 2018-09-07 DIAGNOSIS — I10 Essential (primary) hypertension: Secondary | ICD-10-CM

## 2018-09-07 DIAGNOSIS — Z794 Long term (current) use of insulin: Secondary | ICD-10-CM

## 2018-09-07 DIAGNOSIS — R0789 Other chest pain: Principal | ICD-10-CM

## 2018-09-07 DIAGNOSIS — Z9641 Presence of insulin pump (external) (internal): Secondary | ICD-10-CM

## 2018-09-07 DIAGNOSIS — K219 Gastro-esophageal reflux disease without esophagitis: Secondary | ICD-10-CM

## 2018-09-07 LAB — COMPREHENSIVE METABOLIC PANEL
ALT: 22 U/L (ref 0–55)
AST (SGOT): 17 U/L (ref 10–42)
Albumin/Globulin Ratio: 1.26 Ratio (ref 0.80–2.00)
Albumin: 3.4 gm/dL — ABNORMAL LOW (ref 3.5–5.0)
Alkaline Phosphatase: 61 U/L (ref 40–145)
Anion Gap: 12 mMol/L (ref 7.0–18.0)
BUN / Creatinine Ratio: 28.8 Ratio (ref 10.0–30.0)
BUN: 21 mg/dL (ref 7–22)
Bilirubin, Total: 4.3 mg/dL — ABNORMAL HIGH (ref 0.1–1.2)
CO2: 27 mMol/L (ref 20–30)
Calcium: 8.2 mg/dL — ABNORMAL LOW (ref 8.5–10.5)
Chloride: 105 mMol/L (ref 98–110)
Creatinine: 0.73 mg/dL — ABNORMAL LOW (ref 0.80–1.30)
EGFR: 99 mL/min/{1.73_m2} (ref 60–150)
Globulin: 2.7 gm/dL (ref 2.0–4.0)
Glucose: 162 mg/dL — ABNORMAL HIGH (ref 71–99)
Osmolality Calculated: 286 mOsm/kg (ref 275–300)
Potassium: 4 mMol/L (ref 3.5–5.3)
Protein, Total: 6.1 gm/dL (ref 6.0–8.3)
Sodium: 140 mMol/L (ref 136–147)

## 2018-09-07 LAB — VH DEXTROSE STICK GLUCOSE
Glucose POCT: 174 mg/dL — ABNORMAL HIGH (ref 71–99)
Glucose POCT: 163 mg/dL — ABNORMAL HIGH (ref 71–99)

## 2018-09-07 LAB — CBC
Hematocrit: 39.4 % (ref 39.0–52.5)
Hemoglobin: 13.6 gm/dL (ref 13.0–17.5)
MCH: 30 pg (ref 28–35)
MCHC: 35 gm/dL (ref 32–36)
MCV: 86 fL (ref 80–100)
MPV: 7 fL (ref 6.0–10.0)
PLT CT: 171 10*3/uL (ref 130–440)
RBC: 4.57 10*6/uL (ref 4.00–5.70)
RDW: 12.4 % (ref 11.0–14.0)
WBC: 6.3 10*3/uL (ref 4.0–11.0)

## 2018-09-07 LAB — LIPID PANEL
Cholesterol: 73 mg/dL — ABNORMAL LOW (ref 75–199)
Coronary Heart Disease Risk: 3.32
HDL: 22 mg/dL — ABNORMAL LOW (ref 40–55)
LDL Calculated: 17 mg/dL
Triglycerides: 172 mg/dL — ABNORMAL HIGH (ref 10–150)
VLDL: 34 (ref 0–40)

## 2018-09-07 MED ORDER — ATORVASTATIN CALCIUM 80 MG PO TABS
80.0000 mg | ORAL_TABLET | Freq: Every day | ORAL | 0 refills | Status: DC
Start: 2018-09-07 — End: 2019-10-31

## 2018-09-07 MED ORDER — GLIPIZIDE ER 5 MG PO TB24
5.00 mg | ORAL_TABLET | Freq: Every morning | ORAL | 0 refills | Status: DC
Start: 2018-09-07 — End: 2019-10-31

## 2018-09-07 MED ORDER — ISOSORBIDE MONONITRATE ER 30 MG PO TB24
30.00 mg | ORAL_TABLET | Freq: Every day | ORAL | Status: DC
Start: 2018-09-07 — End: 2018-09-07
  Administered 2018-09-07: 11:00:00 30 mg via ORAL
  Filled 2018-09-07 (×2): qty 1

## 2018-09-07 MED ORDER — ISOSORBIDE MONONITRATE ER 30 MG PO TB24
30.00 mg | ORAL_TABLET | Freq: Every day | ORAL | Status: DC
Start: 2018-09-07 — End: 2018-09-07
  Filled 2018-09-07 (×2): qty 1

## 2018-09-07 MED ORDER — METOPROLOL SUCCINATE ER 50 MG PO TB24
75.00 mg | ORAL_TABLET | Freq: Every day | ORAL | 1 refills | Status: DC
Start: 2018-09-07 — End: 2019-10-31

## 2018-09-07 MED ORDER — ISOSORBIDE MONONITRATE ER 120 MG PO TB24
120.00 mg | ORAL_TABLET | Freq: Every day | ORAL | 0 refills | Status: DC
Start: 2018-09-07 — End: 2019-10-31

## 2018-09-07 MED ORDER — NITROGLYCERIN 0.4 MG SL SUBL
0.4000 mg | SUBLINGUAL_TABLET | SUBLINGUAL | 0 refills | Status: AC | PRN
Start: 2018-09-07 — End: ?

## 2018-09-07 NOTE — Progress Notes (Signed)
NURSE NOTE SUMMARY  [facility] - SURG TELEMETRY STEP-DOWN   Patient Name: Michael [name]   Attending Physician: Obie Dredge, *   Today's date:   09/07/2018 LOS: 1 days   Shift Summary:                                                                0540: Pt c/o CP/back pain 7/10 and numbness on L side of face. EKG obtained. Nitro given x2. Pt states pain is starting to decrease and rates it at 4/10. Denies need for third nitro.    06:25: Pt states he feels much more comfortable. Pain 2/10. Will continue to monitor.     Provider Notifications:      Rapid Response Notifications:  Mobility:      PMP Activity: Step 6 - Walks in Room (09/06/2018  8:00 PM)     Weight tracking:  Family Dynamic:   Last 3 Weights for the past 72 hrs (Last 3 readings):   Weight   09/07/18 0311 99.6 kg (219 lb 9.3 oz)   09/06/18 1204 99.2 kg (218 lb 11.1 oz)             Recent Vitals Last Bowel Movement   BP: 113/76 (09/07/2018  6:02 AM)  Heart Rate: 83 (09/07/2018  5:30 AM)  Temp: 97.5 F (36.4 C) (09/07/2018  5:30 AM)  Resp Rate: (!) 24 (09/07/2018  5:30 AM)  Height: 1.727 m (5\' 8" ) (09/06/2018 12:04 PM)  Weight: 99.6 kg (219 lb 9.3 oz) (09/07/2018  3:11 AM)  SpO2: 99 % (09/07/2018  5:30 AM)   Last BM Date: 09/05/18

## 2018-09-07 NOTE — Progress Notes (Signed)
[facility]  Progress Note    Date Time: 09/07/18 8:54 AM  Patient Name: [name],Michael [name]    Subjective:   Overnight had the same atypical pain, at times relieved by NTG but not consistently  Troponins remain negative  Feels good this am, no pain    Physical Exam:     Visit Vitals  BP 105/46   Pulse 81   Temp 97.7 F (36.5 C) (Temporal)   Resp 18   Ht 1.727 m (5\' 8" )   Wt 99.6 kg (219 lb 9.3 oz)   SpO2 94%   BMI 33.39 kg/m     General/Constitutional: no acute distress, overweight  HEENT: mask on  Neck: supple, no JVD, no bruits  Heart/Cardiovascular:   RRR  no murmurs/rubs/gallops  2+ radial pulses (symmetric)  2+ DP pulses  Respiratory/Lungs: non-labored, clear to auscultation throughout  Abdominal/Gastrointestinal: soft, nontender  Extremities: warm and well-perfused, no edema x4  Skin: no rash  Neurological: alert and oriented x3, grossly nonfocal  Psych: calm    Medications:     . patient own insulin pump       Current Facility-Administered Medications   Medication Dose Route Frequency   . aspirin  81 mg Oral Daily   . atorvastatin  80 mg Oral Daily   . clopidogrel  75 mg Oral Daily   . enoxaparin  40 mg Subcutaneous Q24H   . famotidine  20 mg Oral BID   . hydrOXYzine  25 mg Oral QHS   . insulin (patient own pump): NUTRITION / CORRECTION  1-100 Units Subcutaneous 5XDAY AC, HS & 0300   . insulin (patient own pump): TOTAL DAILY BASAL units  1-100 Units Subcutaneous Q24H   . isosorbide mononitrate  30 mg Oral Daily   . metoprolol tartrate  25 mg Oral Q12H SCH   . polyethylene glycol  17 g Oral Daily   . pregabalin  50 mg Oral TID   . sodium chloride (PF)  3 mL Intravenous Q8H     Prior to Admission medications    Medication Sig Start Date End Date Taking? Authorizing Provider   hydrOXYzine (ATARAX) 25 MG tablet Take 25 mg by mouth nightly   Yes [provider]   Insulin Aspart (INSULIN, PATIENT OWN PUMP,: TOTAL DAILY BASAL UNITS) Inject 2.5 Units into the skin every 1 hour Novolog   Yes  [provider]   isosorbide mononitrate (IMDUR) 30 MG 24 hr tablet Take 30 mg by mouth daily   Yes [provider]   pregabalin (LYRICA) 50 MG capsule Take 50 mg by mouth 3 (three) times daily   Yes [provider]   atorvastatin (LIPITOR) 40 MG tablet Take 40 mg by mouth nightly.    [provider]   CALCIUM-MAGNESIUM-ZINC PO Take 3 tablets by mouth daily       [provider]   cetirizine (ZYRTEC) 10 MG tablet Take 10 mg by mouth daily.    [provider]   clopidogrel (PLAVIX) 75 mg tablet Take 1 tablet (75 mg total) by mouth daily. 03/09/17   Clabe Seal, MD   Dapagliflozin Propanediol (FARXIGA) 10 MG Tab Take by mouth.    [provider]   glipiZIDE XL (GLUCOTROL XL) 5 MG 24 hr tablet Take 5 mg by mouth every morning with breakfast.    [provider]   hydroCHLOROthiazide (MICROZIDE) 12.5 MG capsule Take 12.5 mg by mouth daily    [provider]  Hydrocodone-Acetaminophen 7.5-300 MG Tab Take by mouth every 4 (four) hours as needed.    [provider]   hydrOXYzine (VISTARIL) 50 MG capsule Take 1 capsule (50 mg total) by mouth nightly.  Patient taking differently: Take 25 mg by mouth nightly    03/19/13   Brayton Caves, MD   insulin aspart (NOVOLOG) 100 UNIT/ML injection Inject 30 Units into the skin 3 (three) times daily with meals       [provider]   insulin aspart (NOVOLOG) 100 UNIT/ML injection Inject 35 Units into the skin nightly    [provider]   Insulin Degludec (TRESIBA FLEXTOUCH) 100 UNIT/ML Solution Pen-injector Inject 80 Unit into the skin nightly       [provider]   isosorbide mononitrate (IMDUR) 120 MG 24 hr tablet Take 1 tablet (120 mg total) by mouth Once a day at 6:00am 02/18/18   Colonel Bald, MD   lidocaine (LIDODERM) 5 % Place 1 patch onto the skin 2 (two) times daily Remove & Discard patch within 12 hours or as directed by MD       [provider]    meclizine (ANTIVERT) 12.5 MG tablet Take by mouth. 12/13/16   [provider]   metoclopramide (REGLAN) 10 MG tablet Take 10 mg by mouth 4 (four) times daily    [provider]   metoprolol succinate XL (TOPROL-XL) 50 MG 24 hr tablet Take 50 mg by mouth daily    [provider]   metoprolol tartrate (LOPRESSOR) 50 MG tablet Take 50 mg by mouth daily.        [provider]   Multiple Vitamin (ONE-A-DAY MENS) Tab Take 1 tablet by mouth every morning.    [provider]   nitroglycerin (NITROSTAT) 0.4 MG SL tablet Place 0.4 mg under the tongue every 5 (five) minutes as needed.    [provider]   pantoprazole (PROTONIX) 40 MG tablet Take 40 mg by mouth daily.    [provider]   pregabalin (LYRICA) 75 MG capsule Take 50 mg by mouth 3 (three) times daily.        [provider]   traMADol (ULTRAM) 50 MG tablet Take 50 mg by mouth 3 (three) times daily as needed for Pain       [provider]   vitamin B-12 (CYANOCOBALAMIN) 1000 MCG tablet Take 1,000 mcg by mouth daily.    [provider]         Labs and studies reviewed    Problem List:   Active Problems:    Unstable angina       Assessment:   Michael [name] is 63 y.o. male with    1. Chest pain- atypical, negative troponins despite ongoing pain and unchanged ECG; multiple recent stress tests from 2018 and 2019 all low risk  2. CAD s/p remote PCI  3. Cerebrovasculardisease: Hx of TIA, per Dr. Verne Spurr report  4. HTN w/mild LVH  5. Hyperlipidemia  6. OSA  7. DM, II    Recommendations:     1. TTE no significant findings  2. Pain is pleuritic, inconsistent response to NTG but some relief at times  3. Will add once daily Imdur to see if this helps with symptoms  4. Continue cardiac medications otherwise for aggressive secondary prevention  5. Ok to d/c and f/u as outpt with Dr. Howie Ill      Thank you for consulting [facility] and allowing  [place] to participate in  this pt's care! We will follow along while the pt is in the hospital.    Seward Speck, MD, [facility], RPVI  Interventional Cardiology  [facility]  [address]  [place], [place] [address]  Tel [phone]  Fax [phone]

## 2018-09-07 NOTE — Discharge Instructions (Signed)
Please take your Discharge Paperwork and the Medication List with you to your Follow-Up Appointments.     Your Ejection Fraction, how well your heart pumps blood to the rest of your body, is 55-60% (Normal is Greater than 50%).

## 2018-09-07 NOTE — UM Notes (Signed)
[facility] Utilization Management Review Sheet    Facility :  [facility]    NAME: Michael [name]  MR#: [id number]    CSN#: [id number]    ROOM: 326/326-A AGE: 63 y.o.    ADMIT DATE AND TIME: 09/06/2018 12:12 PM      PATIENT CLASS:    ATTENDING PHYSICIAN: Jenness Corner, MD  PAYOR:Payor: MEDICAID HMO / Plan: THE HEALTH PLAN / Product Type: MANAGED MEDICAID /       AUTH #:     DIAGNOSIS:   HISTORY:   Past Medical History:   Diagnosis Date    CAD (coronary artery disease)     Cellulitis of finger of left hand 10/22/14    Diabetes mellitus     Gastroesophageal reflux disease     Hernia     Hyperlipidemia     Hypertension     Myocardial infarction     Neuropathy     diabetic    Presence of stent in coronary artery     4 STENTS    Sleep apnea     TIA (transient ischemic attack)     Type 2 diabetes mellitus, controlled     On insulin pump now.        DATE OF REVIEW: 09/07/2018    VITALS: BP 105/46    Pulse 81    Temp 97.7 F (36.5 C) (Temporal)    Resp 18    Ht 1.727 m (5\' 8" )    Wt 99.6 kg (219 lb 9.3 oz)    SpO2 94%    BMI 33.39 kg/m     Active Hospital Problems    Diagnosis    Unstable angina       Admission Review  History   Patient stated he was not feeling well all day yesterday and this morning around 2:00 experiencing midsternal chest pain rating to his back in the left side of the shoulder and neck.  The pain continues throughout this morning occurring at rest anda at times increased severity up to 9/10 with occasional sharp pain and the most part pressure-like chest discomfort which is constant..    VS   T:97.7 F (36.5 C) (Temporal),  BP:118/75, HR:97, RR:17, SaO2:98%  Labs  BS 250, CA 8.2, t. Bili 4.3, trop <0.01, <0.01  Imagining   EKG: Per my review shows normal sinus rhythm borderline T wave abnormality  Assessment/Plan  Unstable angina context of CAD status post prior PCI's  --Currently has minimal chest discomfort and is hemodynamically stable  --We will get serial cardiac  markers   --His last MPI/ nuclear stress test was in  07/2017 ;  low risk assessment and normal of EF of 56%  --Start on beta-blocker, statin., antiplatelet therapy  and nitroglycerin  --Telemetry monitoring  --Cards consult and follow-up recommendation subsequent work-up including noninvasive versus invasive ischemic work-up inpatient versus outpatient; to be determined based on his clinical course    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  --Has recurrence of left facial sensory deficit with trigeminal nerve distribution  -- No Other cranial nerve involvement or other focal neurologic deficit  --We will get a CT scan of the head  --Due to severe claustrophobia previous attempts with sedation was unsuccessful for MRI of the brain  --Had previous work-up and cardiac loop recorder in situ,   --We will continue the above management for acute coronary syndrome      Diabetes mellitus type 2, on insulin pump, with  peripheral neuropathy  --Continue to failure to control his diabetes on separate insulin is now on insulin pump which was implanted not too long ago  --Patient is able to manage the insulin pump  --We will monitor blood glucose  --Resume Lyrica for peripheral neuropathy    Essential hypertension  --Controlled   -- currently on metoprolol    Dyslipidemia  --Start on statin    Admission Orders  Tele, card consult

## 2018-09-07 NOTE — UM Notes (Signed)
Antoine Poche, RN, BSN  Phone [phone]  Fax [phone]  Utilization Review  [facility]      Dorse Locy  04-Jun-1955  Observation on 09/06/18    Medicine History & Physical   [facility]  [employer]   Patient Name: ANCIL, DEWAN LOS: 1 days   Attending Physician: Ilda Mori, MD PCP: Clifton James, PA      Assessment and Plan:                                                              Unstable angina context of CAD status post prior PCI's  --Currently has minimal chest discomfort and is hemodynamically stable  --We will get serial cardiac markers   --His last MPI/ nuclear stress test was in  07/2017 ;  low risk assessment and normal of EF of 56%  --Start on beta-blocker, statin., antiplatelet therapy  and nitroglycerin  --Telemetry monitoring  --Cards consult and follow-up recommendation subsequent work-up including noninvasive versus invasive ischemic work-up inpatient versus outpatient; to be determined based on his clinical course    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  --Has recurrence of left facial sensory deficit with trigeminal nerve distribution  -- No Other cranial nerve involvement or other focal neurologic deficit  --We will get a CT scan of the head  --Due to severe claustrophobia previous attempts with sedation was unsuccessful for MRI of the brain  --Had previous work-up and cardiac loop recorder in situ,   --We will continue the above management for acute coronary syndrome      Diabetes mellitus type 2, on insulin pump, with peripheral neuropathy  --Continue to failure to control his diabetes on separate insulin is now on insulin pump which was implanted not too long ago  --Patient is able to manage the insulin pump  --We will monitor blood glucose  --Resume Lyrica for peripheral neuropathy    Essential hypertension  --Controlled   -- currently on metoprolol    Dyslipidemia  --Start on statin      DVT PPx:  Lovenox   Dispo: Observation  Code: full code     History of Presenting Illness                                CC: Chest pain  Sandro Burgo is a 63 y.o. male patient with past medical history of CAD status post PCI x4 ( the last in 29562), hypertension, hyperlipidemia, type 2 diabetes mellitus on insulin pump with peripheral neuropathy, TIA versus cryptogenic stroke ( s/p planned cardiac loop recorder), presented to [facility] as a transfer from [facility] for initially was seen with aforementioned complaint.  Patient stated he was not feeling well all day yesterday and this morning around 2:00 experiencing midsternal chest pain rating to his back in the left side of the shoulder and neck.  The pain continues throughout this morning occurring at rest anda at times increased severity up to 9/10 with occasional sharp pain and the most part pressure-like chest discomfort which is constant..  No aggravating relieving factors.  This chest pain is accompanied with shortness of breath nausea and dizziness.   Patient took  3 nitroglycerin sublingually with minimal improvement in symptoms and after he went to ER of OSH, he was given 4 baby aspirin and Nitropaste was placed;  subsequently had chest pain decreased and has very minimal discomfort at this time.  No ST change for acute ischemia and troponin times was negative.  Patient also states started feeling numbness on his left face and this symptom is similar to what he had about a year ago where he had work-ups include neuroimaging studies and had cardiac loop recorder placed with follow-up with cardiology.  Per record reviewed MRA and MRI could not be completed at that time or subsequently , due to severe claustrophobia even with sedation.  Patient denies any weakness or new numbness in extremities other than his baseline peripheral neuropathy from diabetes.  He denies any headache or double vision.  Denies any speech abnormality.   There is similar numbness previously subsided after a few weeks or months.  Denies any fever or chills.  Denies any sore throat runny nose muscle aches or pain denies any headache.    PastMedicalHistory        Past Medical History:   Diagnosis Date    CAD (coronary artery disease)     Cellulitis of finger of left hand 10/22/14    Diabetes mellitus     Gastroesophageal reflux disease     Hernia     Hyperlipidemia     Hypertension     Myocardial infarction     Neuropathy     diabetic    Presence of stent in coronary artery     4 STENTS    Sleep apnea     TIA (transient ischemic attack)     Type 2 diabetes mellitus, controlled         PastSurgicalHistory         Past Surgical History:   Procedure Laterality Date    AMPUTATION, FINGER Left 02/26/2015    Procedure: AMPUTATION, FINGER;  Surgeon: Lujean Rave, MD;  Location: [facility];  Service: Orthopedics;  Laterality: Left;  Amputation left long finger    CARPAL TUNNEL RELEASE Left     05/30/17    CERVICAL FUSION      CIRCUMCISION, > 13 YEARS, ADULT N/A 09/08/2014    Procedure: CIRCUMCISION, > 13 YEARS, ADULT;  Surgeon: Ellwood Handler, MD;  Location: [facility];  Service: Urology;  Laterality: N/A;  CIRCUMCISION    CORONARY ANGIOPLASTY WITH STENT PLACEMENT      2 stents    CORONARY ANGIOPLASTY WITH STENT PLACEMENT  12/13/12    DES to OM1, 2 stents    EGD, COLONOSCOPY N/A 12/19/2017    Procedure: EGD, COLONOSCOPY;  Surgeon: Salena Saner, MD;  Location: [facility];  Service: Gastroenterology;  Laterality: N/A;    LUMBAR LAMINECTOMY      RELEASE, CUBITAL TUNNEL Left 05/30/2017        FamilyHistory         Family History   Problem Relation Age of Onset    Heart disease Mother     Stroke Mother     Diabetes Mother     Heart attack Mother     Heart disease Father     Stroke Father     Heart attack Father     Diabetes Sister         Social History            Tobacco Use    Smoking status: Never Smoker  Smokeless tobacco: Never Used   Substance Use Topics    Alcohol use: Yes     Comment: once a year maybe    Drug use: No       Subjective   Review of Systems:  Review of systems as noted in HPI. Full 12 point ROS otherwise negative.       Objective       Physical Exam:      Vitals: T:97.7 F (36.5 C) (Temporal),  BP:118/75, HR:97, RR:17, SaO2:98%    1) General Appearance: Alert and oriented x 4. In no acute distress.   2) Eyes: Pink conjunctiva, anicteric sclera. Pupils are equally reactive to light.  3) ENT: Oral mucosa moist with no pharyngeal congestion, erythema or swelling.  4) Neck: Supple, with full range of motion. Trachea is central, no JVD noted  5) Chest: Clear to auscultation bilaterally, no wheezes or rhonchi.  6) CVS: normal rate and regular rhythm, with no murmurs.  7) Abdomen: Soft, non-tender, no palpable mass. Bowel sounds normal. No CVA tenderness  8) Extremities: No pitting edema, pulses palpable, no calf swelling and no gross deformity.  Previous amputated finger on the left  9) Skin: Warm, dry with normal skin turgor, no rash  10) Lymphatics: No lymphadenopathy in axillary, cervical and inguinal area.   11) Neurological: Cranial nerves , decreased sensation to light touch and pain left face involving the 3 part of the trigeminal nerves.  No facial asymmetry normal extraocular eye movements and pupils are normal size and reactive to light, and has normal speech ; rest of cranial nerves unremarkable.  Motor strength 5/5 in both upper and lower extremities, symmetrical decreased due to reflexes, stocking glove distribution sensory deficits to light touch and pain in both upper lower extremities symmetrically  12) Psychiatric: Affect is appropriate. No hallucinations.    EKG: Per my review shows normal sinus rhythm borderline T wave abnormality  Chest Xray: Per my review shows , no acute cardiopulmonary normality or filtrates    Patient  Vitals for the past 12 hrs:   BP Temp Pulse Resp   09/06/18 1204 118/75 97.7 F (36.5 C) 97 17     Weight Monitoring 06/01/2017 06/27/2017 11/16/2017 02/16/2018 02/16/2018 05/01/2018 09/06/2018   Height - - - 172.7 cm - - 172.7 cm   Height Method - - - Stated - - Stated   Weight 94.484 kg 91.173 kg 95.301 kg 99.202 kg 99.2 kg 97.614 kg 99.2 kg   Weight Method - - - Standing Scale Standing Scale - Standing Scale   BMI (calculated) - - - 33.3 kg/m2 - - 33.3 kg/m2           RecentResults         Recent Results (from the past 24 hour(s))   Dextrose Stick Glucose    Collection Time: 09/06/18 12:30 PM   Result Value Ref Range    Glucose, POCT 198 (H) 71 - 99 mg/dL                  Allergies   Allergen Reactions    Gabapentin      "Makes crazy/hyper"    Glucophage [Metformin Hydrochloride] Nausea And Vomiting    Keflex [Cephalexin] Nausea And Vomiting    Morphine      Hallucinations    Victoza [Liraglutide] Nausea And Vomiting      No results found.        Home Medications  atorvastatin (LIPITOR) 40 MG tablet     Take 40 mg by mouth nightly.     cetirizine (ZYRTEC) 10 MG tablet     Take 10 mg by mouth daily.     clopidogrel (PLAVIX) 75 mg tablet     Take 1 tablet (75 mg total) by mouth daily.     hydroCHLOROthiazide (MICROZIDE) 12.5 MG capsule     Take 12.5 mg by mouth daily     hydrOXYzine (ATARAX) 25 MG tablet     Take 25 mg by mouth nightly     Insulin Aspart (INSULIN, PATIENT OWN PUMP,: TOTAL DAILY BASAL UNITS)     Inject 2.5 Units into the skin every 1 hour Novolog     isosorbide mononitrate (IMDUR) 30 MG 24 hr tablet     Take 30 mg by mouth daily     lidocaine (LIDODERM) 5 %     Place 1 patch onto the skin 2 (two) times daily Remove & Discard patch within 12 hours or as directed by MD        metoprolol succinate XL (TOPROL-XL) 50 MG 24 hr tablet     Take 50 mg by mouth daily     nitroglycerin (NITROSTAT) 0.4 MG SL tablet     Place 0.4 mg under the tongue every  5 (five) minutes as needed.     pantoprazole (PROTONIX) 40 MG tablet     Take 40 mg by mouth daily.     pregabalin (LYRICA) 50 MG capsule     Take 50 mg by mouth 3 (three) times daily     traMADol (ULTRAM) 50 MG tablet     Take 50 mg by mouth 3 (three) times daily as needed for Pain                CT of head 09/06/18  Study Result     Clinical History:  Left side facial numbness       IMPRESSION:   CT examination of the brain. No hemorrhage or cerebral edema is identified. No skull fracture is seen. 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      Cardiology consult 09/07/19  [facility]  Progress Note    Date Time: 09/07/18 8:54 AM  Patient Name: Jaimes,Govind ALLEN    Subjective:   Overnight had the same atypical pain, at times relieved by NTG but not consistently  Troponins remain negative  Feels good this am, no pain    Physical Exam:     Visit Vitals  BP 105/46   Pulse 81   Temp 97.7 F (36.5 C) (Temporal)   Resp 18   Ht 1.727 m (5\' 8" )   Wt 99.6 kg (219 lb 9.3 oz)   SpO2 94%   BMI 33.39 kg/m     General/Constitutional: no acute distress, overweight  HEENT: mask on  Neck: supple, no JVD, no bruits  Heart/Cardiovascular:   RRR  no murmurs/rubs/gallops  2+ radial pulses(symmetric)  2+ DP pulses  Respiratory/Lungs: non-labored, clear to auscultation throughout  Abdominal/Gastrointestinal: soft, nontender  Extremities: warm and well-perfused, no edema x4  Skin: no rash  Neurological: alert and oriented x3, grossly nonfocal  Psych: calm    Medications:      patient own insulin pump              Current Facility-Administered Medications   Medication Dose Route Frequency    aspirin  81 mg  Oral Daily    atorvastatin  80 mg Oral Daily    clopidogrel  75 mg Oral Daily    enoxaparin  40 mg Subcutaneous Q24H    famotidine  20 mg Oral BID    hydrOXYzine  25 mg Oral QHS    insulin (patient own pump): NUTRITION /  CORRECTION  1-100 Units Subcutaneous 5XDAY AC, HS & 0300    insulin (patient own pump): TOTAL DAILY BASAL units  1-100 Units Subcutaneous Q24H    isosorbide mononitrate  30 mg Oral Daily    metoprolol tartrate  25 mg Oral Q12H SCH    polyethylene glycol  17 g Oral Daily    pregabalin  50 mg Oral TID    sodium chloride (PF)  3 mL Intravenous Q8H     Problem List:   Active Problems:    Unstable angina       Assessment:   Thayer Ohm Zuberis 62 y.o.malewith    1. Chest pain- atypical, negative troponins despite ongoing pain and unchanged ECG; multiple recent stress tests from 2018 and 2019 all low risk  2. CADs/p remote PCI  3. Cerebrovasculardisease:Hx of TIA, per Dr. Verne Spurr report  4. HTN w/mild LVH  5. Hyperlipidemia  6. OSA  7. DM, II    Recommendations:     1. TTE no significant findings  2. Pain is pleuritic, inconsistent response to NTG but some relief at times  3. Will add once daily Imdur to see if this helps with symptoms  4. Continue cardiac medications otherwise for aggressive secondary prevention  5. Ok to d/c and f/u as outpt with Dr. Howie Ill

## 2018-09-07 NOTE — Discharge Summary (Signed)
Medicine Discharge Summary   [facility] [place] [facility]  [employer]   Patient Name: Michael [name]   Attending Physician: No att. providers found PCP: Clifton James, PA   Date of Admission: 09/06/2018 D/C Date: 09/07/2018   Discharge Diagnoses:     Recurrent Chest pain in the setting of known CAD S/P PCI, poss from T spine Spinal Stenosis  Cryptogenic Stroke with residual wrist deficit  Dyslipidemia  Type II DM, insulin dependent  Essential Hypertension     Hospital Course       Michael [name] is a 63 y.o. male patient that was admitted on 09/06/2018  male patient with past medical history of CAD status post PCI x4 ( the last in [address]), hypertension, hyperlipidemia, type 2 diabetes mellitus on insulin pump with peripheral neuropathy, TIA versus cryptogenic stroke ( s/p planned cardiac loop recorder), presented to [facility] as a transfer from [facility] for initially was seen with aforementioned complaint.  Patient stated he was not feeling well all day yesterday and this morning around 2:00 experiencing midsternal chest pain rating to his back in the left side of the shoulder and neck.  The pain continues throughout this morning occurring at rest anda at times increased severity up to 9/10 with occasional sharp pain and the most part pressure-like chest discomfort which is constant..  No aggravating relieving factors.  This chest pain is accompanied with shortness of breath nausea and dizziness.   Patient took 3 nitroglycerin sublingually with minimal improvement in symptoms and after he went to ER of OSH, he was given 4 baby aspirin and Nitropaste was placed;  subsequently had chest pain decreased and has very minimal discomfort at this time.  No ST change for acute ischemia and troponin times was negative.  Patient also states started feeling numbness on his left face and this symptom is similar to what he had about a year ago where he had work-ups include neuroimaging  studies and had cardiac loop recorder placed with follow-up with cardiology.  Per record reviewed MRA and MRI could not be completed at that time or subsequently , due to severe claustrophobia even with sedation.  Patient denies any weakness or new numbness in extremities other than his baseline peripheral neuropathy from diabetes.  He denies any headache or double vision.  Denies any speech abnormality.  There is similar numbness previously subsided after a few weeks or months.  His trop were cycled this admisson and had been consistently normal. Echo showed normal EF. No further intervention is advised. He later reports he is currently seen by spine surgery for T spine spinal stenosis which likely may contribute to his current symptom.     Pending Results and other significant studies:       Discharge Instructions:          Disposition: Home  Diet: Cardiac Diet  Activity: As tolerated  Discharge Code Status: Prior    Clifton James, [place]  [address] [address] [address]  [phone]    Follow up on 09/18/2018  Follow up appointment 09/18/18 @ 10:15AM.    Orest Dikes, MD  [address]  [place] [address]  [phone]    Follow up on 09/24/2018  Follow up appointment 09/24/18 @ 9:40AM. Patient to call office from parking lot before entering the building. Someone on come out and check him in. Patient must wear mask.       Discharge Medications:  Discharge Medication List      Taking    atorvastatin 80 MG tablet  Dose:  80 mg  What changed:     medication strength   how much to take   when to take this  Commonly known as:  LIPITOR  Take 1 tablet (80 mg total) by mouth daily  Notes to patient:  Next dose: 5/22 PM  New/Changes to medication     CALCIUM-MAGNESIUM-ZINC PO  Dose:  3 tablet  Take 3 tablets by mouth daily   Notes to patient:  Next Dose Due: 5/22 pm     cetirizine 10 MG tablet  Dose:  10 mg  Commonly known as:  ZyrTEC  Take 10 mg  by mouth daily.  Notes to patient:  Next Dose Due: 5/23     clopidogrel 75 mg tablet  Dose:  75 mg  Commonly known as:  PLAVIX  Take 1 tablet (75 mg total) by mouth daily.  Notes to patient:  Next Dose Due: 5/23     glipiZIDE XL 5 MG 24 hr tablet  Dose:  5 mg  Commonly known as:  GLUCOTROL XL  Take 1 tablet (5 mg total) by mouth every morning with breakfast  Notes to patient:  Next Dose Due: 5/23     hydrOXYzine 50 MG capsule  Dose:  50 mg  What changed:  how much to take  Commonly known as:  VISTARIL  Take 1 capsule (50 mg total) by mouth nightly.  Notes to patient:  Next dose: 5/22 PM  New/Changes to medication     insulin (patient own pump): TOTAL DAILY BASAL units  Dose:  2.5 Units  Inject 2.5 Units into the skin every 1 hour Novolog  Notes to patient:  As prior to admission     isosorbide mononitrate 120 MG 24 hr tablet  Dose:  120 mg  What changed:  Another medication with the same name was removed. Continue taking this medication, and follow the directions you see here.  Commonly known as:  IMDUR  Take 1 tablet (120 mg total) by mouth Once a day at 6:00am  Notes to patient:  Next dose: 5/23 AM  New/Changes to medication     lidocaine 5 %  Dose:  1 patch  Commonly known as:  LIDODERM  For:  neuropathy to ankles and hands  Place 1 patch onto the skin 2 (two) times daily Remove & Discard patch within 12 hours or as directed by MD   Notes to patient:  As prior to admission     metoprolol succinate XL 50 MG 24 hr tablet  Dose:  75 mg  What changed:  how much to take  Commonly known as:  TOPROL-XL  Take 1.5 tablets (75 mg total) by mouth daily  Notes to patient:  Next dose: 5/23 AM  New/Changes to medication     nitroglycerin 0.4 MG SL tablet  Dose:  0.4 mg  What changed:  reasons to take this  Commonly known as:  NITROSTAT  Place 1 tablet (0.4 mg total) under the tongue every 5 (five) minutes as needed for Chest pain  Notes to patient:  New/Changes to medication     One-A-Day Mens Tabs  Dose:  1 tablet  Take 1  tablet by mouth every morning.  Notes to patient:  Next Dose Due: 5/23     pantoprazole 40 MG tablet  Dose:  40 mg  Commonly known as:  PROTONIX  Take 40 mg by  mouth daily.  Notes to patient:  Next Dose Due: 5/23     pregabalin 50 MG capsule  Dose:  50 mg  What changed:  Another medication with the same name was removed. Continue taking this medication, and follow the directions you see here.  Commonly known as:  LYRICA  Take 50 mg by mouth 3 (three) times daily  Notes to patient:  Next dose: 5/22 at 2 PM     vitamin B-12 1000 MCG tablet  Dose:  1,000 mcg  Commonly known as:  CYANOCOBALAMIN  Take 1,000 mcg by mouth daily.  Notes to patient:  Next Dose Due: 5/23        STOP taking these medications    Farxiga 10 MG Tabs  Generic drug:  Dapagliflozin Propanediol     hydroCHLOROthiazide 12.5 MG capsule  Commonly known as:  MICROZIDE     HYDROcodone-Acetaminophen 7.5-300 MG Tabs     hydrOXYzine 25 MG tablet  Commonly known as:  ATARAX     insulin aspart 100 UNIT/ML injection  Commonly known as:  NovoLOG     meclizine 12.5 MG tablet  Commonly known as:  ANTIVERT     metoclopramide 10 MG tablet  Commonly known as:  REGLAN     metoprolol tartrate 50 MG tablet  Commonly known as:  LOPRESSOR     NovoLOG 100 UNIT/ML injection  Generic drug:  insulin aspart     traMADol 50 MG tablet  Commonly known as:  Vedia Pereyra FlexTouch 100 UNIT/ML Sopn  Generic drug:  Insulin Degludec             Discharge Day Exam (09/07/2018):     Blood pressure 133/87, pulse 99, temperature 97.7 F (36.5 C), temperature source Temporal, resp. rate 18, height 1.727 m (5\' 8" ), weight 99.6 kg (219 lb 9.3 oz), SpO2 94 %.      General: Patient is awake. In no acute distress.  Chest: CTA bilaterally. No rhonchi, no wheezing. No use of accessory muscles.  CVS: Normal rate and regular rhythm no murmurs, without JVD.  Abdomen: Soft, non-tender, no guarding or rigidity, with normal bowel sounds.  Extremities: No pitting edema, pulses palpable, no calf  swelling and no gross deformity.  Skin: Warm, dry  NEURO: No motor or sensory deficits.     Recent Labs      Recent Labs   Lab 09/07/18  0500 09/06/18  1420   WBC 6.3 6.1   RBC 4.57 4.82   Hemoglobin 13.6 14.6   Hematocrit 39.4 41.6   MCV 86 86   PLT CT 171 172         Recent Labs   Lab 09/06/18  2049 09/06/18  1744 09/06/18  1420   Troponin I 0.01 <0.01 <0.01     Lab Results   Component Value Date    HGBA1CPERCNT 8.8 09/06/2018     Recent Labs   Lab 09/07/18  0500 09/06/18  1420   Glucose 162* 250*   Sodium 140 138   Potassium 4.0 4.3   Chloride 105 104   CO2 27 26   BUN 21 24*   Creatinine 0.73* 0.85   EGFR 99 93   Calcium 8.2* 8.2*     Recent Labs   Lab 09/07/18  0500 09/06/18  1420   Albumin 3.4* 3.5   Protein, Total 6.1 6.3   Bilirubin, Total 4.3* 4.3*   Alkaline Phosphatase 61 64   ALT 22 23  AST (SGOT) 17 18        Allergies:      Gabapentin; Glucophage [metformin hydrochloride]; Keflex [cephalexin]; Morphine; and Victoza [liraglutide]   Time spent on discharging the patient:  35 minutes   Xr Chest 2 Views    Result Date: 09/06/2018  Chronic elevation right hemidiaphragm with no new focal infiltrate, pleural effusion or pneumothorax. Normal heart size. ReadingStation:[email or web address]    Ct Head Wo Contrast    Result Date: 09/06/2018  CT examination of the brain. No hemorrhage or cerebral edema is identified. No skull fracture is seen. 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 ReadingStation:[email or web address]     Home Health Needs:  There are no questions and answers to display.      Jenness Corner, MD         09/07/18 9:48 PM   MRN: [id number]                                      CSN: [id number] DOB: 12-Dec-1955

## 2018-09-08 MED ORDER — ISOSORBIDE MONONITRATE ER 30 MG TABLET,EXTENDED RELEASE 24 HR
30.00 mg | ORAL_TABLET | ORAL | Status: DC
Start: 2018-09-08 — End: 2018-09-08

## 2018-09-08 MED ORDER — GENERIC EXTERNAL MEDICATION
Status: DC
Start: ? — End: 2018-09-08

## 2018-09-09 LAB — ECG 12-LEAD
P Wave Axis: 50 deg
P-R Interval: 212 ms
Patient Age: 62 years
Q-T Interval(Corrected): 451 ms
Q-T Interval: 401 ms
QRS Axis: 39 deg
QRS Duration: 87 ms
T Axis: 7 years
Ventricular Rate: 76 //min

## 2018-09-15 ENCOUNTER — Ambulatory Visit (INDEPENDENT_AMBULATORY_CARE_PROVIDER_SITE_OTHER): Payer: MEDICAID

## 2018-09-15 DIAGNOSIS — Z4509 Encounter for adjustment and management of other cardiac device: Secondary | ICD-10-CM

## 2018-09-15 DIAGNOSIS — I639 Cerebral infarction, unspecified: Secondary | ICD-10-CM
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
## 2018-09-24 ENCOUNTER — Ambulatory Visit (INDEPENDENT_AMBULATORY_CARE_PROVIDER_SITE_OTHER): Payer: MEDICAID | Admitting: Cardiovascular Disease

## 2018-09-24 ENCOUNTER — Other Ambulatory Visit: Payer: Self-pay

## 2018-09-24 ENCOUNTER — Encounter (INDEPENDENT_AMBULATORY_CARE_PROVIDER_SITE_OTHER): Payer: Self-pay | Admitting: Cardiovascular Disease

## 2018-09-24 VITALS — BP 145/84 | HR 97 | Resp 18 | Ht 68.0 in | Wt 224.0 lb

## 2018-09-24 DIAGNOSIS — I639 Cerebral infarction, unspecified: Secondary | ICD-10-CM

## 2018-09-24 DIAGNOSIS — I2510 Atherosclerotic heart disease of native coronary artery without angina pectoris: Secondary | ICD-10-CM

## 2018-09-24 DIAGNOSIS — I10 Essential (primary) hypertension: Secondary | ICD-10-CM

## 2018-09-24 MED ORDER — METOPROLOL TARTRATE 100 MG TABLET
100.00 mg | ORAL_TABLET | Freq: Two times a day (BID) | ORAL | 1 refills | Status: DC
Start: 2018-09-24 — End: 2019-03-25

## 2018-09-24 NOTE — Procedures (Signed)
[address]  [address] [place] [address]  [phone]       Date: 09/15/2018  Patient Name: Edwin [name]  MRN#: [id number]  DOB: Mar 26, 1956    Remote ILR Device Interrogation    Model/SN [employer]onic [id number]  Implant Date Sep 14, 2017  Loop Recorder    Patient's ILR was checked by remote access.     31 day analysis was performed of patient's implantable loop recorder, including analysis of programmed parameters, battery, heart rhythm, counters, any observation summaries, and alerts.  Please see programmer printout for further details (scanned document).    Battery     OK    Events/Episodes     None    Diagnostic Observations     No new observations.      Follow up in 1 month, remote

## 2018-09-25 ENCOUNTER — Ambulatory Visit (INDEPENDENT_AMBULATORY_CARE_PROVIDER_SITE_OTHER): Payer: MEDICAID | Admitting: NEUROSURGERY

## 2018-09-25 ENCOUNTER — Encounter (INDEPENDENT_AMBULATORY_CARE_PROVIDER_SITE_OTHER): Payer: Self-pay | Admitting: NEUROSURGERY

## 2018-09-25 VITALS — BP 114/71 | HR 89 | Ht 68.0 in | Wt 224.0 lb

## 2018-09-25 DIAGNOSIS — M4810 Ankylosing hyperostosis [Forestier], site unspecified: Secondary | ICD-10-CM

## 2018-09-25 DIAGNOSIS — M546 Pain in thoracic spine: Secondary | ICD-10-CM

## 2018-09-25 DIAGNOSIS — E1165 Type 2 diabetes mellitus with hyperglycemia: Secondary | ICD-10-CM

## 2018-09-25 DIAGNOSIS — M47819 Spondylosis without myelopathy or radiculopathy, site unspecified: Principal | ICD-10-CM

## 2018-09-25 DIAGNOSIS — E1142 Type 2 diabetes mellitus with diabetic polyneuropathy: Secondary | ICD-10-CM

## 2018-09-25 DIAGNOSIS — I519 Heart disease, unspecified: Secondary | ICD-10-CM

## 2018-09-25 DIAGNOSIS — M542 Cervicalgia: Secondary | ICD-10-CM

## 2018-09-25 DIAGNOSIS — F40240 Claustrophobia: Secondary | ICD-10-CM

## 2018-09-25 DIAGNOSIS — M6250 Muscle wasting and atrophy, not elsewhere classified, unspecified site: Secondary | ICD-10-CM

## 2018-09-25 DIAGNOSIS — M48061 Spinal stenosis, lumbar region without neurogenic claudication: Secondary | ICD-10-CM

## 2018-09-25 DIAGNOSIS — M21372 Foot drop, left foot: Secondary | ICD-10-CM

## 2018-09-25 DIAGNOSIS — G8929 Other chronic pain: Secondary | ICD-10-CM

## 2018-09-25 DIAGNOSIS — M549 Dorsalgia, unspecified: Secondary | ICD-10-CM

## 2018-09-25 DIAGNOSIS — Z9889 Other specified postprocedural states: Secondary | ICD-10-CM

## 2018-09-25 MED ORDER — MELOXICAM 7.5 MG TABLET
7.5000 mg | ORAL_TABLET | Freq: Every day | ORAL | 0 refills | Status: AC
Start: 2018-09-25 — End: 2018-10-25

## 2018-09-25 NOTE — Progress Notes (Signed)
[facility]  [facility] OFFICE BUILDING 3  [address] [place]  [place] [id number]  [phone]        Name: Edwin GraffChester A Genest  MRN: [id number]  Age: 63 y.o.  DOB: March 08, 1956  ENCOUNTER DATE: 09/25/2018    CHIEF COMPLAINT: Low Back Pain (F/U WITH IMAGING, PT COULD NO9T HAVE DONE)      HPI:      Patient is a very pleasant 63 year old gentleman who I initially saw in June of 2019 for back and leg pain evaluation. He cames from Neurology, Dr Gildardo CrankerMir,for evaluation of stenosis. He continues to state that 75% his pain is in his back. He does get bilateral leg pains and weakness. He feels a ease tripping and falling. He has had chronic left foot weakness and foot drop.  [place] and then had be quickly returned to the operating room within several days for either hematoma or spinal fluid leak.  He has also had previous neck surgery in 2010.   He has been diabetic for some 25 years and had blood sugars that ran between 300-400. He transitioned over to insulin at this stage and now his hemoglobin A1c 1 from a 9 to a 7.4.  He is using an insulin pump now for the last 2 months.  He has extensive pulmonary and cardiac issues as well as his diabetes.  [facility] has just [facility] for chest pain.Marland Kitchen. He has cardiac issues and pulmonary issues. He had an MRI in 2010. He has had progressive weakness in his upper and lower extremities. He had recent left carpal tunnel and left ulnar nerve release. He comes [facility]. He has been on chronic anticoagulation. He feels numb from his knees down. He has difficulty with tripping and falling. He is having fine motor control problems his upper extremities as well. MRI scan was performed and neurosurgery is asked to comment on his stenosis.  He was seen again just a few months ago and multiple studies were ordered which she does not bring in.  He has varying degrees of  stenosis.  He has chronic muscle wasting on exam both distal upper and lower extremity weakness left worse than right.  He has a left chronic footdrop.  He had hamstring injury a year ago as well.  Prior surgery was at the L4-5 and L5-S1 levels from a left-sided approach and he has gone on to auto fuse the L5-S1 level.  There was moderate to severe stenosis at the L3-4 level with facet arthropathy seen at this level as well as L4-5.  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  Describes his pain in all extremities as 7/10, down from previously described 8/10.  Most of his pain however is in his back chronically.  He states his hips are very painful  It is described as burning shooting stabbing and shock-like specially in his lower extremities into the shins and feet. He did have a history of MRSA infection and subsequent lost his distal digit in his upper extremity. Describes the burning pain in his shins bilaterally as a "cattle prod". 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 Activities universally worsen his pain.  Frequent  movements and adjusting himself and slowing down his activities does ease things off.  He is not working because of disability.  Both legs are equal involved.  His pain continues to be stabbing and  sharp with radiations up between shoulder blades and down into his hips knees in lower extremities.  He cannot stand for long period of time.  Walking sitting in leg or twisting rolling worsens his pain.  He complains of worsening low back pain as well as leg, hip, and knee pains.  He has "excruciating pain" between his shoulder blades.  His sugars are better controlled now and he has been improved for an insulin pump.  He continues on Plavix.  He has not been established yet with pain management closer to home.  He comes in for 3 month follow-up but with no new studies.  He had extreme claustrophobia and could not get the cervical, thoracic, and lumbar  spine scans performed.  He would not tolerate Valium sedation.    Patient Active Problem List   Diagnosis   . Spondyloarthropathy       Past Surgical History:   Procedure Laterality Date   . 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 HAND SURGERY Left    . HX BACK SURGERY      Low Back   . HX CERVICAL SPINE SURGERY     . HX LUMBAR SPINE SURGERY  2000    in [place]    . HX STENTING (ANY)  2010    cardiac stent x 4    . NECK SURGERY         acetaminophen (TYLENOL) 500 mg Oral Tablet, Take 500 mg by mouth Every 4 hours as needed for Pain  albuterol sulfate (PROVENTIL OR VENTOLIN OR PROAIR) 90 mcg/actuation Inhalation HFA Aerosol Inhaler, Take 1-2 Puffs by inhalation Every 6 hours as needed  atorvastatin (LIPITOR) 40 mg Oral Tablet, Take 80 mg by mouth Every evening   calcium carbonate (CALCIUM 300 ORAL), Take 1,000 mg by mouth Once a day  cetirizine (ZYRTEC) 10 mg Oral Tablet, Take 10 mg by mouth Once a day  cholecalciferol, vitamin D3, 1,000 unit Oral Tablet, Take 1,000 Units by mouth Once a day  clopidogreL (PLAVIX) 75 mg Oral Tablet, Take 1 Tab (75 mg total) by mouth Once a day  cyanocobalamin (VITAMIN B 12) 1,000 mcg Oral Tablet, Take 1,000 mcg by mouth Once a day  fluticasone propionate (FLONASE) 50 mcg/actuation Nasal Spray, Suspension, 1 Spray by Each Nostril route Once a day  hydrOXYzine pamoate (VISTARIL) 50 mg Oral Capsule, Take 50 mg by mouth Three times a day as needed for Itching  isosorbide mononitrate (IMDUR) 60 mg Oral Tablet Sustained Release 24 hr, Take 1 Tab (60 mg total) by mouth Every morning  lidocaine (LIDODERM) 5 % Adhesive Patch, Medicated, 1 Patch by Transdermal route Once a day  magnesium oxide,aspartate,citr 400 mg magnesium Oral Capsule, Take by mouth Once a day  metoprolol tartrate (LOPRESSOR) 100 mg Oral Tablet, Take 1 Tab (100 mg total) by mouth Twice daily  pantoprazole (PROTONIX) 40 mg Oral Tablet, Delayed Release (E.C.), Take 40 mg by mouth Once a day  pregabalin (LYRICA) 50  mg Oral Capsule, Take 50 mg by mouth Three times a day  zinc sulfate (ZINC-15 ORAL), Take by mouth Once a day  dapagliflozin (FARXIGA) 10 mg Oral Tablet, Take 10 mg by mouth Once a day  insulin aspart (NOVOLOG FLEXPEN U-100 INSULIN SUBQ), 4 Units by Continuous Sub-Q Infusn (via wearable injector) route Every one hour   insulin degludec (TRESIBA FLEXTOUCH U-100) 100 unit/mL (3 mL) Subcutaneous Insulin Pen, by Subcutaneous route  meclizine (ANTIVERT) 12.5 mg Oral Tablet,  Take 1 Tab (12.5 mg total) by mouth Three times a day as needed  traMADol (ULTRAM) 50 mg Oral Tablet, Take 1 Tab by mouth Every 6 hours as needed for Pain    No facility-administered medications prior to visit.       Allergies   Allergen Reactions   . Gabapentin  Other Adverse Reaction (Add comment)     hallucinations     . Morphine  Other Adverse Reaction (Add comment)     Hallucinations     . Tylenol Pm [Diphenhydramine-Acetaminophen]    . Glucophage [Metformin] Nausea/ Vomiting   . Keflex [Cephalexin] Nausea/ Vomiting   . Victoza [Liraglutide] Nausea/ Vomiting       Family Medical History:     Problem Relation (Age of Onset)    Coronary Artery Disease Mother, Father    Diabetes Mother, Sister    Heart Attack Mother, Father    Stroke Mother, Father              Social History     Socioeconomic History   . Marital status: Single     Spouse name: Not on file   . Number of children: Not on file   . Years of education: Not on file   . Highest education level: Not on file   Tobacco Use   . Smoking status: Never Smoker   . Smokeless tobacco: Never Used   Substance and Sexual Activity   . Alcohol use: No   . Drug use: No   Other Topics Concern       REVIEW OF SYSTEMS:     12 systems reviewed and all are negative with the exception of what is listed in the HPI    PHYSICAL EXAM:    BP 114/71   Pulse 89   Ht 1.727 m (5\' 8" )   Wt 102 kg (224 lb)   BMI 34.06 kg/m         Physical exam:  Patient is in no acute distress.  Ambulation into the room and about  the room is somewhat impaired.  There is chronic wasting of his left lower extremity.  He has a fairly unsteady gait      HEENT:  Normocephalic.  Atraumatic.  No crepitus.  No nuchal tenderness.  No facial asymmetry.  Hears well bilateral with normal speech production as well.  No malocclusion of the jaw.  No TMJ pain elicited. Dentition intact.  Mucous membranes moist.  Conjunctiva clear.  No erythema or exudates.  No battle signs or raccoon's eyes.  External ear canal without lesions.  No otorrhea or rhinorrhea.    Neck:  Good carotid upstrokes.  No adenopathy.  Trachea midline.  No crepitus.  No meningeal signs.    CV:  No JVD.  No murmurs.  Regular pulse.    Pulmonary:  No shortness of breath with muscle testing.  No dyspnea with exertion or with talking.  Equal air exchange.  No dullness to percussion.  No rib dysfunction with palpation.  No chest wall deformity.  No use of accessory muscles of respiration.    Abdomen:  Soft, nontender, no masses, no guarding or rebound.  No significant hernias.    Back:  No tenderness to percussion.  No point tenderness.  Range-of-motion appropriate.    Musculoskeletal:  Range of motion of shoulders is appropriate without crepitus or limitation.  Muscle mass is decreased throughout especially distally.  He has wasting of his intrinsics and thenar and hypothenar eminences.  He has a digit missing in the distal 4th finger on the left hand.  Hips and knees show no swelling either.  Dupuytren's contracture noted bilaterally, left greater than right.  No palpable step-offs.  No scoliotic deformities or excessive kyphosis.  No soft tissue masses.    Neurologic:  Alert oriented x4.  Speech and affect appropriate.  Speech output appropriate.  Cognition clear.  Thought processes and cadence of  speech appropriate.  Cranial nerves 2-12 appropriate.  Tongue without fasciculations.  Motor strength shows some asymmetry, distal upper lower extremities weaker than proximal, left side  weaker in left lower extremity with greater than right lower extremity  Sensation impaired bilaterally again distally greater than proximally and left side worse than right.  Loss of vibration and proprioception and pinprick and light touch discrepancies noted bilaterally.  Fine motor control is slowed.  No cerebellar dysfunction.  Significant gait ataxia exist but he ambulates without assistance.  Gets up from a seated position well.  Memory and behavior intact.  No spasticity no hypertonicity no crossed adductor     Peripheral vascular:  Warm extremities bilaterally.  Good pulses bilaterally.    Skin:  No rashes or lesions.    Lymphatics:  No adenopathy.          IMAGING:    I personally reviewed this film and radiology report, my interpretation is as follows:  Old [facility] from 5, 15, 2019 reviewed    I have reviewed the following:  Evidence of fused L5-S1 segment.  The segment was operated on from a left-sided approach.  Osteophytic disc noted.  Facet arthropathy seen.  Loss of muscle taken.  L4-5 has similar findings with facet arthropathy postsurgical changes to the left.  Appears to be evidence of nerve root clumping especially L4-5 5 1. Foraminal stenosis and central stenosis noted at L L3-4 with an asymmetric facet complex to the left.  Moderate to severe stenosis noted here.  Diffuse sarcopenia noted as well.  No instability or lytic lesions noted        Lumbar x-rays showed diffuse lumbar spondylosis.  Evidence of anterior osteophytes are seen.  Facet arthropathy noted.  No lytic or destructive lesions seen.  No spondylolisthesis    ASSESSMENT:    ENCOUNTER DIAGNOSES     ICD-10-CM   1. 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 Spinal stenosis of lumbar region without neurogenic claudication M48.061   3. History of lumbar laminectomy for spinal cord decompression Z98.890   4. Muscular atrophy, unspecified site M62.50   5. Chronic neck pain M54.2    G89.29   6. Chronic back pain  M54.9    G89.29   7. 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 Discogenic thoracic pain M54.6   9. Diabetic peripheral neuropathy (CMS HCC) E11.42   10. Cardiac disease I51.9   11. Poorly controlled diabetes mellitus (CMS HCC) E11.65   12. Left foot drop M21.372   13. Claustrophobia F40.240   14. History of back surgery Z98.890       PLAN:    Patient returns in with continued cervical thoracic and lumbar pains.  He is having worsening hip and knee pains.  He has a footdrop but is now wearing a left footdrop splint and having "diabetic shoes and "fitted which makes him walk better.  His back pain as somewhat lessened.  He was unable tolerate getting the studies ordered because he could not tolerate them due to his anxiety and claustrophobia.  Oral medications for his anxiety did not help.  He will need anesthesia assisted sedation for the studies.  Arthritic medication will be given due to his with increasing hip and knee pains.  He has not taken any arthritic medication.  He stop taking his Ultram.  He continues taking his neuropathic.  He will try the arthritic medication for 30 days and then defer to his primary care physician for further medication along this line.  His A1c has improved from 9 down the 7.5 and he is proud of this.  He does feel better after he has had his insulin pump.  We discussed the stenosis noted at the L3-4 level.  There are postsurgical changes seen a left L4-5 L5-S1 levels.  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  Given his DISH syndrome and is ankylosing picture, full spine evaluation with imaging is required.  He will undergo aquatic therapy once the pandemic has lessened and the centers open up.  Otherwise until then heel walk.  He does not use a walking stick and this was strongly encouraged given his peripheral neuropathy and footdrop.  He may be a surgical candidate in the future will see him back in 3 months with  the above-mentioned studies and all old studies for further evaluation.  He is agreeable to this course.  He understands accepts use the medication as well.    Orders:  Obtain cervical, thoracic, and lumbar MRI scans under anesthesia due to severe claustrophobia and anxiety.  Return back in some 3 months for my review.      The patient was given the opportunity to ask questions and those questions were answered to the patient's satisfaction. The patient was encouraged to call with any additional questions or concerns. Instructed patient to call back if symptoms worse.     FOLLOW UP:    Return in about 3 months (around 12/26/2018).     Portions of this note may be dictated using voice recognition software so variances in spelling, vocabulary, commands for voice recognition, and word or phase choices may be ambiguous, unclear, or unintelligible.   Not all  errors  or unclear areas are able to be  caught or corrected.  Please notify the office if any discrepancies are noted or if the meaning of any statements are on clear.    Glenetta Borg, MD

## 2018-09-26 ENCOUNTER — Other Ambulatory Visit (INDEPENDENT_AMBULATORY_CARE_PROVIDER_SITE_OTHER): Payer: Self-pay | Admitting: Cardiovascular Disease

## 2018-09-26 DIAGNOSIS — I679 Cerebrovascular disease, unspecified: Secondary | ICD-10-CM

## 2018-09-26 DIAGNOSIS — I639 Cerebral infarction, unspecified: Secondary | ICD-10-CM

## 2018-09-26 MED ORDER — CLOPIDOGREL 75 MG TABLET
75.0000 mg | ORAL_TABLET | Freq: Every day | ORAL | 1 refills | Status: DC
Start: 2018-09-26 — End: 2019-03-25

## 2018-09-27 ENCOUNTER — Encounter (INDEPENDENT_AMBULATORY_CARE_PROVIDER_SITE_OTHER): Payer: Self-pay | Admitting: Cardiovascular Disease

## 2018-10-02 ENCOUNTER — Encounter (INDEPENDENT_AMBULATORY_CARE_PROVIDER_SITE_OTHER): Payer: MEDICAID | Admitting: NEUROSURGERY

## 2018-10-16 ENCOUNTER — Ambulatory Visit (INDEPENDENT_AMBULATORY_CARE_PROVIDER_SITE_OTHER): Payer: MEDICAID

## 2018-10-16 DIAGNOSIS — Z95818 Presence of other cardiac implants and grafts: Secondary | ICD-10-CM

## 2018-10-16 DIAGNOSIS — I639 Cerebral infarction, unspecified: Secondary | ICD-10-CM

## 2018-10-16 DIAGNOSIS — Z4509 Encounter for adjustment and management of other cardiac device: Secondary | ICD-10-CM

## 2018-10-17 ENCOUNTER — Other Ambulatory Visit: Payer: Self-pay

## 2018-10-17 NOTE — Procedures (Signed)
[place]  [address] [place]  [address] [place] [address]  [phone]       Date: 10/16/2018  Patient Name: Edwin [name]  MRN#: [id number]  DOB: Oct 12, 1955    Remote ILR Device Interrogation    Model/SN [employer]onic [id number]  Implant Date Sep 14, 2017  Loop Recorder    Patient's ILR was checked by remote access.     31 day analysis was performed of patient's implantable loop recorder, including analysis of programmed parameters, battery, heart rhythm, counters, any observation summaries, and alerts.  Please see programmer printout for further details (scanned document).    Battery     OK    Events/Episodes     None    Diagnostic Observations     No new observations.      Follow up in 1 month, remote

## 2018-10-24 ENCOUNTER — Ambulatory Visit (INDEPENDENT_AMBULATORY_CARE_PROVIDER_SITE_OTHER): Payer: Self-pay | Admitting: NEUROSURGERY

## 2018-10-24 NOTE — Telephone Encounter (Signed)
10/24/18 Returned Mr. Venard call and explained to him that Dr. Dorothe Pea does not order IV sedation for the patient to get an MRI. We can order something to help him relax but due to his extensive health issues that is all we could do.  He needs to contact his PCP to discuss other options. This has already been explained to him by [facility].  Patient expressed understanding and will let us know what the PCP says.  Jaquita Folds, [place]

## 2018-10-24 NOTE — Telephone Encounter (Signed)
-----   Message from Azucena Fallen, [place] sent at 10/24/2018  3:47 PM EDT -----  Regarding: FW: Edwin [name] - MRI orders w/ sedation  ----- Message from Roland Earl sent at 10/24/2018  2:51 PM EDT -----  Edwin [name]  Pt requesting MRIs be ordered as the last ones were unsuccessful - send to [facility] [place] please.  States he is also needing anesthesia sedation.  Please advise.  Thanks,

## 2018-11-08 ENCOUNTER — Encounter (RURAL_HEALTH_CENTER): Payer: Self-pay | Admitting: Primary Podiatric Medicine

## 2018-11-08 ENCOUNTER — Ambulatory Visit: Payer: 59 | Attending: Primary Podiatric Medicine | Admitting: Primary Podiatric Medicine

## 2018-11-08 VITALS — HR 92 | Temp 97.6°F | Resp 16 | Wt 222.3 lb

## 2018-11-08 DIAGNOSIS — M79671 Pain in right foot: Secondary | ICD-10-CM

## 2018-11-08 DIAGNOSIS — M21372 Foot drop, left foot: Secondary | ICD-10-CM

## 2018-11-08 DIAGNOSIS — B351 Tinea unguium: Secondary | ICD-10-CM

## 2018-11-08 DIAGNOSIS — E1142 Type 2 diabetes mellitus with diabetic polyneuropathy: Secondary | ICD-10-CM

## 2018-11-08 DIAGNOSIS — L851 Acquired keratosis [keratoderma] palmaris et plantaris: Secondary | ICD-10-CM

## 2018-11-08 DIAGNOSIS — M21621 Bunionette of right foot: Secondary | ICD-10-CM

## 2018-11-08 DIAGNOSIS — M79672 Pain in left foot: Secondary | ICD-10-CM

## 2018-11-08 DIAGNOSIS — M21622 Bunionette of left foot: Secondary | ICD-10-CM

## 2018-11-08 NOTE — Progress Notes (Signed)
Subjective:    Patient ID: Michael [name] is a 62 y.o. male.    Patient with a history of diabetes CAD hypertension and lumbar disc disease with spinal stenosis who seen for exam and treatment.  Patient relates that this bracing on this left side continues to help with this dropfoot deformity.  Patient complains of dystrophic incurvated and nails today which are difficult for him to manage as well as painful hyperkeratotic lesions.  Is continue to follow with neurology for this for spinal stenosis.      The following portions of the patient's history were reviewed and updated as appropriate: allergies, current medications, past family history, past medical history, past social history, past surgical history and problem list.    Review of Systems   Constitutional: Negative.    Respiratory: Negative.    Cardiovascular: Negative.    Musculoskeletal: Negative.    Skin: Negative.    Allergic/Immunologic: Negative.    Neurological: Positive for numbness.   Hematological: Negative.    All other systems reviewed and are negative.     Pulse 92, temperature 97.6 F (36.4 C), temperature source Tympanic, resp. rate 16, weight 100.8 kg (222 lb 4.8 oz), SpO2 94 %.          Objective:    Physical Exam  Patient with warm dry supple skin over this lower extremity.  Patient with nails that are thick dystrophic discolored and elongated x10 with some incurvation relief with debridement today.  Patient with hyperkeratotic lesion sub-fifth thumb bilaterally which are closed with the debridement.  Patient with decreased pulses without ischemic changes or claudication noted today.  Patient with protective sensation decreases lower leg with 5.0 7 monofilament with numbness throughout.  Patient with prominent fifth metatarsal head over this bilateral lower lower extremity.      Assessment:       1. Diabetic peripheral neuropathy associated with type 2 diabetes mellitus    2. Onychomycosis    3. Acquired keratoderma    4. Foot drop,  left foot    5. Tailor's bunion of both feet    6. Pain in both feet          Plan:       Then we did debride the nails x10 without incident sharply debride these hyperkeratotic lesions with a #15 blade.  Discussed the import of regular foot exams and blood glucose control and advised to call with new concerns or complaints.

## 2018-11-16 ENCOUNTER — Encounter (INDEPENDENT_AMBULATORY_CARE_PROVIDER_SITE_OTHER): Payer: Self-pay

## 2018-12-03 ENCOUNTER — Other Ambulatory Visit (INDEPENDENT_AMBULATORY_CARE_PROVIDER_SITE_OTHER): Payer: Self-pay | Admitting: Cardiovascular Disease

## 2018-12-03 MED ORDER — ATORVASTATIN 40 MG TABLET
80.00 mg | ORAL_TABLET | Freq: Every evening | ORAL | 1 refills | Status: DC
Start: 2018-12-03 — End: 2018-12-20

## 2018-12-17 ENCOUNTER — Ambulatory Visit (INDEPENDENT_AMBULATORY_CARE_PROVIDER_SITE_OTHER): Payer: MEDICAID

## 2018-12-17 DIAGNOSIS — Z95818 Presence of other cardiac implants and grafts: Secondary | ICD-10-CM

## 2018-12-17 DIAGNOSIS — Z4509 Encounter for adjustment and management of other cardiac device: Secondary | ICD-10-CM

## 2018-12-20 ENCOUNTER — Other Ambulatory Visit (INDEPENDENT_AMBULATORY_CARE_PROVIDER_SITE_OTHER): Payer: Self-pay | Admitting: Cardiovascular Disease

## 2018-12-20 MED ORDER — ATORVASTATIN 40 MG TABLET
80.00 mg | ORAL_TABLET | Freq: Every evening | ORAL | 1 refills | Status: DC
Start: 2018-12-20 — End: 2018-12-21

## 2018-12-21 ENCOUNTER — Other Ambulatory Visit (INDEPENDENT_AMBULATORY_CARE_PROVIDER_SITE_OTHER): Payer: Self-pay | Admitting: Cardiovascular Disease

## 2018-12-21 MED ORDER — ATORVASTATIN 80 MG TABLET
80.00 mg | ORAL_TABLET | Freq: Every evening | ORAL | 1 refills | Status: DC
Start: 2018-12-21 — End: 2019-06-17

## 2019-01-01 ENCOUNTER — Encounter (INDEPENDENT_AMBULATORY_CARE_PROVIDER_SITE_OTHER): Payer: Medicare PPO | Admitting: NEUROSURGERY

## 2019-01-14 ENCOUNTER — Encounter: Payer: Self-pay | Admitting: Neurological Surgery

## 2019-01-14 DIAGNOSIS — E1342 Other specified diabetes mellitus with diabetic polyneuropathy: Secondary | ICD-10-CM

## 2019-01-14 DIAGNOSIS — M48061 Spinal stenosis, lumbar region without neurogenic claudication: Secondary | ICD-10-CM

## 2019-01-14 DIAGNOSIS — M6250 Muscle wasting and atrophy, not elsewhere classified, unspecified site: Secondary | ICD-10-CM

## 2019-01-14 DIAGNOSIS — M47819 Spondylosis without myelopathy or radiculopathy, site unspecified: Secondary | ICD-10-CM

## 2019-01-14 DIAGNOSIS — M4810 Ankylosing hyperostosis [Forestier], site unspecified: Secondary | ICD-10-CM

## 2019-01-14 DIAGNOSIS — Z9889 Other specified postprocedural states: Secondary | ICD-10-CM

## 2019-01-14 DIAGNOSIS — M542 Cervicalgia: Secondary | ICD-10-CM

## 2019-01-14 DIAGNOSIS — M546 Pain in thoracic spine: Secondary | ICD-10-CM

## 2019-01-17 ENCOUNTER — Ambulatory Visit (INDEPENDENT_AMBULATORY_CARE_PROVIDER_SITE_OTHER): Payer: Medicare PPO

## 2019-01-17 DIAGNOSIS — Z4509 Encounter for adjustment and management of other cardiac device: Secondary | ICD-10-CM

## 2019-01-17 DIAGNOSIS — Z95818 Presence of other cardiac implants and grafts: Secondary | ICD-10-CM

## 2019-01-29 ENCOUNTER — Encounter (INDEPENDENT_AMBULATORY_CARE_PROVIDER_SITE_OTHER): Payer: Medicare PPO | Admitting: NEUROSURGERY

## 2019-01-31 ENCOUNTER — Encounter (RURAL_HEALTH_CENTER): Payer: Self-pay | Admitting: Primary Podiatric Medicine

## 2019-01-31 ENCOUNTER — Ambulatory Visit: Payer: Medicare (Managed Care) | Attending: Primary Podiatric Medicine | Admitting: Primary Podiatric Medicine

## 2019-01-31 VITALS — HR 91 | Temp 97.6°F | Resp 18

## 2019-01-31 DIAGNOSIS — M79671 Pain in right foot: Secondary | ICD-10-CM

## 2019-01-31 DIAGNOSIS — E1142 Type 2 diabetes mellitus with diabetic polyneuropathy: Secondary | ICD-10-CM

## 2019-01-31 DIAGNOSIS — L600 Ingrowing nail: Secondary | ICD-10-CM

## 2019-01-31 DIAGNOSIS — B351 Tinea unguium: Secondary | ICD-10-CM

## 2019-01-31 DIAGNOSIS — M21372 Foot drop, left foot: Secondary | ICD-10-CM

## 2019-01-31 DIAGNOSIS — M79672 Pain in left foot: Secondary | ICD-10-CM

## 2019-01-31 NOTE — Progress Notes (Signed)
Subjective:    Patient ID: Michael [name] is a 63 y.o. male.    Patient with a history of diabetes on insulin pump as well as TIA and spinal stenosis who seen for exam and treatment of his lower extremity.  Patient continues to follow-up for treatment for this low back pain spinal stenosis.  Patient complains of dystrophic incurvated nails which are difficult for him to manage.  Complains of some irritation on his left medial aspect.  Patient denies any open lesions or drainage today.      The following portions of the patient's history were reviewed and updated as appropriate: allergies, current medications, past family history, past medical history, past social history, past surgical history and problem list.    Review of Systems   Constitutional: Negative.    Respiratory: Negative.    Cardiovascular: Positive for leg swelling.   Musculoskeletal: Negative.    Skin: Negative.    Allergic/Immunologic: Negative.    Neurological: Negative.    Hematological: Negative.    All other systems reviewed and are negative.     Pulse 91, temperature 97.6 F (36.4 C), temperature source Tympanic, resp. rate 18, SpO2 91 %.          Objective:    Physical Exam  Patient with warm dry thin skin over this lower extremity.  Patient with nails that are thick dystrophic discolored and elongated x10.  Patient with a spicule remaining at this left medial aspect which is removed today.  It does appear stable and there is no pain with palpation following this removal.  Patient with palpable pulses with mild edema but without ischemic changes or claudication.  Patient with decreased protective sensation distally with monofilament with numbness throughout.  Patient continues with his dropfoot deformity on this left side.      Assessment:       1. Diabetic peripheral neuropathy associated with type 2 diabetes mellitus    2. Onychomycosis    3. Foot drop, left foot    4. Ingrown nail    5. Pain in both feet          Plan:       After  consent we did debride nails x10 without incident taking care to remove these incurvated aborter's as needed.  Discussed the import of blood glucose control and regular foot exams prevent the complications.  Advised to call with new concerns or complaints.

## 2019-02-17 ENCOUNTER — Ambulatory Visit (INDEPENDENT_AMBULATORY_CARE_PROVIDER_SITE_OTHER): Payer: Medicare PPO

## 2019-02-17 DIAGNOSIS — Z95818 Presence of other cardiac implants and grafts: Secondary | ICD-10-CM

## 2019-02-17 DIAGNOSIS — Z4509 Encounter for adjustment and management of other cardiac device: Secondary | ICD-10-CM

## 2019-02-18 ENCOUNTER — Other Ambulatory Visit (HOSPITAL_COMMUNITY): Payer: Self-pay

## 2019-02-18 ENCOUNTER — Inpatient Hospital Stay (HOSPITAL_COMMUNITY): Admit: 2019-02-18 | Discharge: 2019-02-18 | Disposition: A | Discharge status: Home / Self Care | Payer: Self-pay

## 2019-02-18 ENCOUNTER — Ambulatory Visit
Admission: RE | Admit: 2019-02-18 | Discharge: 2019-02-18 | Disposition: A | Discharge status: Home / Self Care | Payer: Medicare (Managed Care) | Source: Ambulatory Visit | Attending: Neurological Surgery | Admitting: Neurological Surgery

## 2019-02-18 ENCOUNTER — Encounter: Payer: Self-pay | Admitting: Anesthesiology

## 2019-02-18 ENCOUNTER — Ambulatory Visit: Payer: Medicare (Managed Care) | Admitting: Anesthesiology

## 2019-02-18 DIAGNOSIS — G8929 Other chronic pain: Secondary | ICD-10-CM

## 2019-02-18 DIAGNOSIS — M50222 Other cervical disc displacement at C5-C6 level: Secondary | ICD-10-CM | POA: Insufficient documentation

## 2019-02-18 DIAGNOSIS — M48061 Spinal stenosis, lumbar region without neurogenic claudication: Secondary | ICD-10-CM

## 2019-02-18 DIAGNOSIS — E1342 Other specified diabetes mellitus with diabetic polyneuropathy: Secondary | ICD-10-CM | POA: Insufficient documentation

## 2019-02-18 DIAGNOSIS — M4317 Spondylolisthesis, lumbosacral region: Secondary | ICD-10-CM | POA: Insufficient documentation

## 2019-02-18 DIAGNOSIS — M546 Pain in thoracic spine: Secondary | ICD-10-CM | POA: Insufficient documentation

## 2019-02-18 DIAGNOSIS — Z9889 Other specified postprocedural states: Secondary | ICD-10-CM

## 2019-02-18 DIAGNOSIS — M4810 Ankylosing hyperostosis [Forestier], site unspecified: Secondary | ICD-10-CM | POA: Insufficient documentation

## 2019-02-18 DIAGNOSIS — M6250 Muscle wasting and atrophy, not elsewhere classified, unspecified site: Secondary | ICD-10-CM

## 2019-02-18 DIAGNOSIS — M47819 Spondylosis without myelopathy or radiculopathy, site unspecified: Secondary | ICD-10-CM

## 2019-02-18 DIAGNOSIS — M4802 Spinal stenosis, cervical region: Secondary | ICD-10-CM | POA: Insufficient documentation

## 2019-02-18 MED ORDER — FENTANYL CITRATE (PF) 50 MCG/ML IJ SOLN (WRAP)
INTRAMUSCULAR | Status: DC | PRN
Start: 2019-02-18 — End: 2019-02-18
  Administered 2019-02-18: 50 ug via INTRAVENOUS

## 2019-02-18 MED ORDER — GLYCOPYRROLATE 0.2 MG/ML IJ SOLN (WRAP)
INTRAMUSCULAR | Status: DC | PRN
Start: 2019-02-18 — End: 2019-02-18
  Administered 2019-02-18: 0.2 mg via INTRAVENOUS

## 2019-02-18 MED ORDER — VH PROPOFOL INFUSION 10 MG/ML (WRAPPED)
INTRAVENOUS | Status: AC
Start: 2019-02-18 — End: ?
  Filled 2019-02-18: qty 200

## 2019-02-18 MED ORDER — VH PROPOFOL INFUSION 10 MG/ML (WRAPPED)
INTRAVENOUS | Status: DC | PRN
Start: 2019-02-18 — End: 2019-02-18
  Administered 2019-02-18: 150 ug/kg/min via INTRAVENOUS

## 2019-02-18 MED ORDER — PROPOFOL 200 MG/20ML IV EMUL
INTRAVENOUS | Status: AC
Start: 2019-02-18 — End: ?
  Filled 2019-02-18: qty 20

## 2019-02-18 MED ORDER — SODIUM CHLORIDE 0.9 % IV SOLN
INTRAVENOUS | Status: DC | PRN
Start: 2019-02-18 — End: 2019-02-18

## 2019-02-18 MED ORDER — VH PHENYLEPHRINE 30 MG IN NS 250 ML INFUSION (SIMPLE)
INTRAVENOUS | Status: DC | PRN
Start: 2019-02-18 — End: 2019-02-18
  Administered 2019-02-18: 50 ug/min via INTRAVENOUS

## 2019-02-18 MED ORDER — FENTANYL CITRATE (PF) 50 MCG/ML IJ SOLN (WRAP)
INTRAMUSCULAR | Status: AC
Start: 2019-02-18 — End: ?
  Filled 2019-02-18: qty 2

## 2019-02-18 MED ORDER — PROPOFOL 200 MG/20ML IV EMUL
INTRAVENOUS | Status: DC | PRN
Start: 2019-02-18 — End: 2019-02-18
  Administered 2019-02-18: 50 mg via INTRAVENOUS
  Administered 2019-02-18: 150 mg via INTRAVENOUS

## 2019-02-18 NOTE — Anesthesia Postprocedure Evaluation (Signed)
Anesthesia Post Evaluation    Patient: Michael [name]    * No procedures listed *    Anesthesia type: general    Last Vitals:   Vitals Value Taken Time   BP 98/57 02/18/19 1131   Temp 36.3 C (97.3 F) 02/18/19 1119   Pulse 76 02/18/19 1131   Resp 16 02/18/19 1119   SpO2 97 % 02/18/19 1131                 Anesthesia Post Evaluation:     Patient Evaluated: bedside  Patient Participation: complete - patient participated  Level of Consciousness: awake  Pain Score: 1  Pain Management: adequate    Airway Patency: patent    Anesthetic complications: No      PONV Status: none    Cardiovascular status: acceptable  Respiratory status: acceptable  Hydration status: acceptable        Signed by: Darrell Jewel, 02/18/2019 11:43 AM

## 2019-02-18 NOTE — Transfer of Care (Signed)
Anesthesia Transfer of Care Note    Patient: Michael [name]    Last vitals:   Vitals:    02/18/19 1119   BP: (!) 82/49   Pulse: 76   Resp: 16   Temp: 36.3 C (97.3 F)   SpO2: 96%       Oxygen: Room Air     Mental Status:sedated    Airway: Natural    Cardiovascular Status:  stable

## 2019-02-18 NOTE — Progress Notes (Signed)
MRI completed at 1106 and propofol drip discontinued. LMA removed by Dr.Nguyen at 1114, maintains patent airway, respiration regular and unlabored, color pink , vital signs stable. Patient taken to [facility] at 1115 and report given to Assunta Gambles., RN.

## 2019-02-18 NOTE — Progress Notes (Addendum)
Patient brought to Radiology holding from MRI via stretcher, report received from Elease Hashimoto, [place] and Dr. Cyndie Chime.  Patient currently asleep, vss, will continue to monitor pt status throughout recovery.    1141:  Pt A&O, VSS, pt denies any pain, SOB, or current needs.  Called pt ride [facility]) to arrange pick up time of 12:15.  Will continue to monitor pt status until he is d/c.    Discharge instructions verbally reviewed with patient, he verbalized understanding and denied any further questions, printed copy given to patient.  Patients VSS,  IV dressing CDI, no SOB, patient has no complaints of chest pain.  Patient will be escorted to H&V entrance at 12:15 when his ride arrives by RN via wheelchair with all belongings in their possession, patient's friend Susy Frizzle is driving him home.

## 2019-02-18 NOTE — Discharge Instructions (Signed)
INSTRUCTIONS:   1.  The ordering physician will get the MRI results in 3 to 5 days.  Your physician may call you with the results or may review the results with you at your next scheduled appointment.   2.  The IV site may feel sore.  A moist, cool washcloth to the site for 10 to 15 minutes at a time for a few times should ease the soreness.  However, if the site becomes painful, red, swollen or has any drainage, please see a healthcare professional for advice.             3. We recommend that you have a caregiver with you overnight.    ACTIVITY:   1.  No driving for 24 hours after the procedure.   2.  You may return to work tomorrow.   3.  You may feel tired for a few hours.  This is a normal response so plan for rest and relaxation the rest of today.                MEDICATION:   Resume all medications per your routine.    DIET:   Start with a clear liquid and something easy to digest (example: cracker, toast); if this initial meal is tolerated then you may resume your normal diet.    If you need to speak with a healthcare provider regarding your care after your Anesthesia please call the Radiology Nurses at [phone], Monday-Friday 7:30am-5:00pm.      If you require Emergent Medical care report to the closest  Emergency Room.

## 2019-02-18 NOTE — Progress Notes (Signed)
Patient received to anesthesia prep room at 0930. Focused nursing assessment completed. Dr. Cyndie Chime in to assess patient and anesthesia consent obtained. Patient placed on MRI monitors at 0937, VSS. Induction started at 0946. LMA inserted orally by Dr. Cyndie Chime at [id number], placement verified, etco2 monitoring started, and tube secured with tape-see anesthesia record for all vital signs, medications and oxygen/airway management. Patient taken to MRI room at 0951 via stretcher. Transferred to table and positioned by MRI staff, all lines and tubes patent and secure. Scan started at 0955.

## 2019-02-18 NOTE — Anesthesia Preprocedure Evaluation (Signed)
Anesthesia Evaluation    AIRWAY    Mallampati: II    TM distance: >3 FB  Neck ROM: limited  Mouth Opening:full   CARDIOVASCULAR    cardiovascular exam normal, regular and normal       DENTAL           PULMONARY    pulmonary exam normal and clear to auscultation     OTHER FINDINGS                Relevant Problems   NEURO/PSYCH   (+) TIA (transient ischemic attack)      CARDIO   (+) CAD (coronary artery disease)   (+) Hypertension   (+) Unstable angina      ENDO   (+) Type 2 diabetes mellitus   (+) Type 2 diabetes mellitus with diabetic neuropathy       PSS Anesthesia Comments:  CAD, DM, HTN, HLD, MI, stent        Anesthesia Plan    ASA 3     general               (Risks discussed including but not limited to neurological,cardiovascular, pulmonary complications, intra-operative awareness, death, dental trauma, and allergic reaction.     Questions answered. Pt and/or legal guardian understand and wishes to proceed.    )      Detailed anesthesia plan: general LMA                               Signed by: Darrell Jewel 02/18/19 9:21 AM

## 2019-02-19 ENCOUNTER — Telehealth: Payer: Self-pay

## 2019-03-05 ENCOUNTER — Encounter (INDEPENDENT_AMBULATORY_CARE_PROVIDER_SITE_OTHER): Payer: Medicare PPO | Admitting: NEUROSURGERY

## 2019-03-05 ENCOUNTER — Other Ambulatory Visit: Payer: Self-pay

## 2019-03-05 NOTE — Procedures (Signed)
[address] [place] [address]  [address] [place] [address]  [phone]       Date: 01/17/2019  Patient Name: Edwin [name]  MRN#: [id number]  DOB: 12/17/1955    Remote ILR Device Interrogation    Model/SN Medtronic [id number] S  Implant Date Sep 14, 2017  Loop Recorder    Patient's ILR was checked by remote access.     31 day analysis was performed of patient's implantable loop recorder, including analysis of programmed parameters, battery, heart rhythm, counters, any observation summaries, and alerts.  Please see programmer printout for further details (scanned document).    Battery     OK    Events/Episodes     None    Diagnostic Observations     No new observations.      Follow up in 1 month, remote.

## 2019-03-05 NOTE — Procedures (Signed)
[address] [place] [address]  [address] [place] [address]  [phone]       Date: 02/17/2019  Patient Name: Edwin [name]  MRN#: [id number]  DOB: 05/18/1955    Remote ILR Device Interrogation    Model/SN Medtronic [id number] S  Implant Date Sep 14, 2017  Loop Recorder    Patient's ILR was checked by remote access.     31 day analysis was performed of patient's implantable loop recorder, including analysis of programmed parameters, battery, heart rhythm, counters, any observation summaries, and alerts.  Please see programmer printout for further details (scanned document).    Battery     OK    Events/Episodes     None    Diagnostic Observations     No new observations.      Follow up in 1 month, remote.

## 2019-03-05 NOTE — Procedures (Signed)
[place] [place] [address]  [address] [place] [address]  [phone]       Date: 12/17/2018  Patient Name: Edwin [name]  MRN#: [id number]  DOB: 07-01-1955    Remote ILR Device Interrogation    Model/SN Medtronic [id number]  Implant Date Sep 14, 2017  Loop Recorder    Patient's ILR was checked by remote access.     31 day analysis was performed of patient's implantable loop recorder, including analysis of programmed parameters, battery, heart rhythm, counters, any observation summaries, and alerts.  Please see programmer printout for further details (scanned document).    Battery     OK    Events/Episodes     None    Diagnostic Observations     No new observations.      Follow up in 1 month, remote.

## 2019-03-11 ENCOUNTER — Other Ambulatory Visit (HOSPITAL_BASED_OUTPATIENT_CLINIC_OR_DEPARTMENT_OTHER): Payer: Self-pay

## 2019-03-12 ENCOUNTER — Encounter (INDEPENDENT_AMBULATORY_CARE_PROVIDER_SITE_OTHER): Payer: Self-pay | Admitting: NEUROSURGERY

## 2019-03-12 ENCOUNTER — Ambulatory Visit (INDEPENDENT_AMBULATORY_CARE_PROVIDER_SITE_OTHER): Payer: Medicare PPO | Admitting: NEUROSURGERY

## 2019-03-12 ENCOUNTER — Other Ambulatory Visit: Payer: Self-pay

## 2019-03-12 VITALS — BP 128/73 | HR 94 | Ht 68.0 in | Wt 221.0 lb

## 2019-03-12 DIAGNOSIS — M549 Dorsalgia, unspecified: Secondary | ICD-10-CM

## 2019-03-12 DIAGNOSIS — M546 Pain in thoracic spine: Secondary | ICD-10-CM

## 2019-03-12 DIAGNOSIS — Z8614 Personal history of Methicillin resistant Staphylococcus aureus infection: Secondary | ICD-10-CM

## 2019-03-12 DIAGNOSIS — Z9889 Other specified postprocedural states: Secondary | ICD-10-CM

## 2019-03-12 DIAGNOSIS — M47819 Spondylosis without myelopathy or radiculopathy, site unspecified: Secondary | ICD-10-CM

## 2019-03-12 DIAGNOSIS — I519 Heart disease, unspecified: Secondary | ICD-10-CM

## 2019-03-12 DIAGNOSIS — E1142 Type 2 diabetes mellitus with diabetic polyneuropathy: Secondary | ICD-10-CM

## 2019-03-12 DIAGNOSIS — M4810 Ankylosing hyperostosis [Forestier], site unspecified: Secondary | ICD-10-CM

## 2019-03-12 DIAGNOSIS — E1165 Type 2 diabetes mellitus with hyperglycemia: Secondary | ICD-10-CM

## 2019-03-12 DIAGNOSIS — M48061 Spinal stenosis, lumbar region without neurogenic claudication: Secondary | ICD-10-CM

## 2019-03-12 DIAGNOSIS — M21372 Foot drop, left foot: Secondary | ICD-10-CM

## 2019-03-12 DIAGNOSIS — M6250 Muscle wasting and atrophy, not elsewhere classified, unspecified site: Secondary | ICD-10-CM

## 2019-03-12 DIAGNOSIS — M542 Cervicalgia: Secondary | ICD-10-CM

## 2019-03-12 DIAGNOSIS — R260 Ataxic gait: Secondary | ICD-10-CM

## 2019-03-12 DIAGNOSIS — G8929 Other chronic pain: Secondary | ICD-10-CM

## 2019-03-20 ENCOUNTER — Encounter (INDEPENDENT_AMBULATORY_CARE_PROVIDER_SITE_OTHER): Payer: Self-pay

## 2019-03-25 ENCOUNTER — Other Ambulatory Visit (INDEPENDENT_AMBULATORY_CARE_PROVIDER_SITE_OTHER): Payer: Self-pay | Admitting: Cardiovascular Disease

## 2019-03-25 DIAGNOSIS — I679 Cerebrovascular disease, unspecified: Secondary | ICD-10-CM

## 2019-03-25 DIAGNOSIS — I639 Cerebral infarction, unspecified: Secondary | ICD-10-CM

## 2019-03-26 MED ORDER — CLOPIDOGREL 75 MG TABLET
75.0000 mg | ORAL_TABLET | Freq: Every day | ORAL | 1 refills | Status: DC
Start: 2019-03-26 — End: 2019-09-10

## 2019-03-26 MED ORDER — METOPROLOL TARTRATE 100 MG TABLET
100.00 mg | ORAL_TABLET | Freq: Two times a day (BID) | ORAL | 1 refills | Status: DC
Start: 2019-03-26 — End: 2019-09-10

## 2019-04-03 ENCOUNTER — Encounter (INDEPENDENT_AMBULATORY_CARE_PROVIDER_SITE_OTHER): Payer: Medicare PPO | Admitting: Cardiovascular Disease

## 2019-04-03 ENCOUNTER — Encounter (INDEPENDENT_AMBULATORY_CARE_PROVIDER_SITE_OTHER): Payer: Self-pay | Admitting: Cardiovascular Disease

## 2019-04-19 HISTORY — PX: HX UPPER ENDOSCOPY: 2100001144

## 2019-04-20 ENCOUNTER — Encounter (INDEPENDENT_AMBULATORY_CARE_PROVIDER_SITE_OTHER): Payer: Self-pay

## 2019-04-23 ENCOUNTER — Encounter (RURAL_HEALTH_CENTER): Payer: Self-pay | Admitting: Primary Podiatric Medicine

## 2019-04-23 ENCOUNTER — Ambulatory Visit: Payer: Medicare (Managed Care) | Attending: Primary Podiatric Medicine | Admitting: Primary Podiatric Medicine

## 2019-04-23 VITALS — HR 95 | Temp 97.3°F | Resp 17 | Wt 221.0 lb

## 2019-04-23 DIAGNOSIS — L600 Ingrowing nail: Secondary | ICD-10-CM

## 2019-04-23 DIAGNOSIS — E1142 Type 2 diabetes mellitus with diabetic polyneuropathy: Secondary | ICD-10-CM

## 2019-04-23 DIAGNOSIS — Z9889 Other specified postprocedural states: Secondary | ICD-10-CM

## 2019-04-23 DIAGNOSIS — L03032 Cellulitis of left toe: Secondary | ICD-10-CM

## 2019-04-23 DIAGNOSIS — M21372 Foot drop, left foot: Secondary | ICD-10-CM

## 2019-04-23 NOTE — Progress Notes (Signed)
Subjective:    Patient ID: Michael [name] is a 64 y.o. male.    Patient with a history of diabetes and lumbar disc disease who returns to clinic status post matrixectomy over this left great toe.  Patient relates that this is healed up completely until recently he noticed some bleeding and drainage from the cyst site.  Patient has little sensation in the site he may have had some eschar that he removed.  Patient denies any fever chills nausea and vomiting.  Has been applying antibiotic ointment and a Band-Aid at this site.      The following portions of the patient's history were reviewed and updated as appropriate: allergies, current medications, past family history, past medical history, past social history, past surgical history and problem list.    Review of Systems   Constitutional: Negative.    Respiratory: Negative.    Cardiovascular: Negative.    Musculoskeletal: Negative.    Skin: Positive for wound.   Allergic/Immunologic: Negative.    Neurological: Positive for numbness.   Hematological: Negative.    All other systems reviewed and are negative.    Pulse 95, temperature 97.3 F (36.3 C), temperature source Tympanic, resp. rate 17, weight 100.2 kg (221 lb), SpO2 94 %.          Objective:    Physical Exam  Patient with warm dry supple skin over this lower extremity.  Patient with his left great toenail which does have a small area open at the base of this nail.  Today measures 0.3 x 0.3 x 0.1 cm with a granular wound bed.  There is no purulence or signs of infection.  Patient with decreased with palpable pulses mild edema but without ischemic changes or claudication.  Patient with altered sensation distally with monofilament with numbness in his feet.  Patient with protective sensation decreased.      Assessment:       1. Diabetic peripheral neuropathy associated with type 2 diabetes mellitus    2. Foot drop, left foot    3. Ingrown nail    4. Post-operative state    5. Paronychia of great toe of left  foot          Plan:       *Today we treated this hypergranular tissue with silver nitrate.  Today we will dressed with dilute Betadine gauze and tape.  Advised to avoid soaking avoid Band-Aids and to avoid antibiotic ointment.  We will have him use this dilute Betadine every other day with gauze and tape at this point.  We will have him return to clinic in 2 weeks for reevaluation.

## 2019-05-07 ENCOUNTER — Encounter (RURAL_HEALTH_CENTER): Payer: Self-pay | Admitting: Primary Podiatric Medicine

## 2019-05-07 ENCOUNTER — Ambulatory Visit: Payer: Medicare (Managed Care) | Attending: Primary Podiatric Medicine | Admitting: Primary Podiatric Medicine

## 2019-05-07 VITALS — HR 100 | Temp 97.3°F | Resp 17

## 2019-05-07 DIAGNOSIS — M71072 Abscess of bursa, left ankle and foot: Secondary | ICD-10-CM

## 2019-05-07 DIAGNOSIS — E11621 Type 2 diabetes mellitus with foot ulcer: Secondary | ICD-10-CM

## 2019-05-07 DIAGNOSIS — L97421 Non-pressure chronic ulcer of left heel and midfoot limited to breakdown of skin: Secondary | ICD-10-CM

## 2019-05-07 NOTE — Progress Notes (Signed)
Subjective:    Patient ID: Michael [name] is a 64 y.o. male.    Patient with a history of diabetes and CAD who returns to clinic for exam and treatment.  Patient had a small site of granulation tissue on his left great toe following matrixectomy which appears resolved today.  Patient with new complaint today however of possible abscess on his plantar left foot.  Patient does not recall stepping on anything he did check his shoes but eventually developed significant pain and redness and swelling on his plantar left foot.  Patient presented to the emergency room where x-rays were taken as well as antibiotics are started.  Did not get a culture from the cyst site we believe.  Patient relates that this did seem to improve but still has this lesion on his plantar foot as well as some tenderness.  Denies any fever chills nausea and vomiting today.      The following portions of the patient's history were reviewed and updated as appropriate: allergies, current medications, past family history, past medical history, past social history, past surgical history and problem list.    Review of Systems   Constitutional: Negative.    Respiratory: Negative.    Cardiovascular: Negative.    Musculoskeletal: Negative.    Skin: Negative.    Allergic/Immunologic: Negative.    Neurological: Positive for numbness.   Hematological: Negative.    All other systems reviewed and are negative.    Pulse 100, temperature 97.3 F (36.3 C), temperature source Tympanic, resp. rate 17, SpO2 98 %.          Objective:    Physical Exam  Patient with warm dry supple skin over this lower extremity.  Patient with this lesion on the plantar aspect of this left foot.  There was blister site noted at this level.  This was deroofed today.  After a debriefing this measured approximately 1.1 x 0.5 x 0.2 cm.  There is a granulation tissue with just a central area of fibrosis.  No foreign body was noted today.  Patient with decreased pulses with mild edema  but without ischemic changes or claudication.  Patient with altered sensation distally with monofilament.  Patient with tenderness across the site.      Assessment:       1. Abscess of bursa of left foot    2. Diabetic ulcer of left midfoot associated with type 2 diabetes mellitus, limited to breakdown of skin          Plan:       After consent we did perform debridement to this wound site with a #15 blade removing nonvital tissue.  Today we dressed with alginate gauze and tape in order patient dressing supplies so he can do this daily.  Today we will dispense a surgical shoe for this left lower extremity relieve the pressure from the cyst site.  Advised patient to complete the course of antibiotics and call with concerns or complaints.

## 2019-05-21 ENCOUNTER — Encounter (INDEPENDENT_AMBULATORY_CARE_PROVIDER_SITE_OTHER): Payer: Self-pay

## 2019-05-23 ENCOUNTER — Encounter (RURAL_HEALTH_CENTER): Payer: Self-pay | Admitting: Primary Podiatric Medicine

## 2019-05-23 ENCOUNTER — Ambulatory Visit: Payer: Medicare (Managed Care) | Attending: Primary Podiatric Medicine | Admitting: Primary Podiatric Medicine

## 2019-05-23 ENCOUNTER — Encounter (RURAL_HEALTH_CENTER): Payer: Self-pay

## 2019-05-23 VITALS — HR 104 | Temp 97.8°F | Resp 17 | Wt 225.9 lb

## 2019-05-23 DIAGNOSIS — M71072 Abscess of bursa, left ankle and foot: Secondary | ICD-10-CM

## 2019-05-23 DIAGNOSIS — L97421 Non-pressure chronic ulcer of left heel and midfoot limited to breakdown of skin: Secondary | ICD-10-CM

## 2019-05-23 DIAGNOSIS — M21372 Foot drop, left foot: Secondary | ICD-10-CM

## 2019-05-23 DIAGNOSIS — E1142 Type 2 diabetes mellitus with diabetic polyneuropathy: Secondary | ICD-10-CM

## 2019-05-23 DIAGNOSIS — E11621 Type 2 diabetes mellitus with foot ulcer: Secondary | ICD-10-CM

## 2019-05-23 NOTE — Progress Notes (Signed)
Subjective:    Patient ID: Michael [name] is a 64 y.o. male.    Patient returns to clinic for reevaluation of abscess and wound on his plantar left foot.  Patient relates he had little drainage from the cyst site denies any fever chills nausea and vomiting.  Patient does continue with his bracing for dropfoot deformity on the cyst site.      The following portions of the patient's history were reviewed and updated as appropriate: allergies, current medications, past family history, past medical history, past social history, past surgical history and problem list.    Review of Systems   Constitutional: Negative.    Respiratory: Negative.    Cardiovascular: Positive for leg swelling.   Musculoskeletal: Negative.    Skin: Negative.    Allergic/Immunologic: Negative.    Neurological: Positive for numbness.   Hematological: Negative.    All other systems reviewed and are negative.    Pulse (!) 104, temperature 97.8 F (36.6 C), temperature source Tympanic, resp. rate 17, weight 102.5 kg (225 lb 14.4 oz), SpO2 91 %.          Objective:    Physical Exam  Patient with warm dry supple skin over this lower extremity.  Patient with his previous wound site on this plantar left foot which is fragile he closed today with the debridement.  Patient with his left great toe bed which is stable as well.  Patient with decreased with palpable pulses with mild edema but without ischemic changes or claudication.  Patient with protective sensation altered distally with some burning and numbness in these feet recently.      Assessment:       1. Abscess of bursa of left foot    2. Diabetic ulcer of left midfoot associated with type 2 diabetes mellitus, limited to breakdown of skin    3. Foot drop, left foot    4. Diabetic peripheral neuropathy associated with type 2 diabetes mellitus          Plan:       Consent we did perform sharp full-thickness excisional debridement to this wound site with a #15 blade removing nonvital tissue.   Discussed that as long as there is no drainage we do not need to redress the cyst site.  Advised to continue to monitor his lower extremity closely and call with concerns or complaints.

## 2019-06-02 ENCOUNTER — Encounter (INDEPENDENT_AMBULATORY_CARE_PROVIDER_SITE_OTHER): Payer: Self-pay | Admitting: NEUROSURGERY

## 2019-06-02 NOTE — Progress Notes (Signed)
Edwin [name], MEDICAL OFFICE BUILDING 3  [address] [place] [address]  [phone]        Name: Edwin [name]  MRN: [id number]  Age: 64 y.o.  DOB: [birth year]  ENCOUNTER DATE: 03/12/2019    CHIEF COMPLAINT: Low Back Pain      HPI:      Patient is a very pleasant 64 year old gentleman who I initially saw in June of 2019 for back and leg pain evaluation. He came from Neurology, Dr Gildardo Cranker evaluation of stenosis. He continues to state that 75% his pain is in his back. He does get bilateral leg pains and weakness. He feels a ease tripping and falling. He has had chronic left foot weakness and foot drop.He had previous surgery in [place] in 2000 and then had be quickly returned to the operating room within several days for either hematoma or spinal fluid leak.  He has also had previous neck surgery in 2010.  He has been diabetic for some 25 years and had blood sugars that ran between 300-400. He transitioned over to insulin at this stage and now his hemoglobin A1c 1 from a 9 to a 7.4.  He is using an insulin pump now for the last 2 months.  He has extensive pulmonary and cardiac issues as well as his diabetes.  He has just recently been to the [facility] emergency room for chest pain.Marland Kitchen He has cardiac issues and pulmonary issues. He had an MRI in 2010. He has had progressive weakness in his upper and lower extremities. He had recent left carpal tunnel and left ulnar nerve release. He comes from [facility]. He has been on chronic anticoagulation. He feels numb from his knees down. He has difficulty with tripping and falling. He is having fine motor control problems and his upper extremities as well. MRI scan was performed and neurosurgery was asked to comment on his lumbar stenosis.  He was last seen in June of 2020I ordered multiple studies which he did not bring in at last visit.  He now has them for my review. He has varying degrees of  stenosis. He has chronic muscle wasting on exam both distal upper and lower extremity weakness left worse than right. He has a left chronic footdrop. He had hamstring injury a year ago as well. Prior surgery was at the L4-5 and L5-S1 levels from a left-sided approach and he has gone on to auto fuse the L5-S1 level. There was moderate to severe stenosis at the L3-4 level with facet arthropathy seen at this level as well as L4-5. 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  Describes his pain in all extremities as 8/10, up from previously described 7/10.Most of his pain however is in his back and it has chronically then any these locations.  He states his hips are very painful It is described as burning shooting stabbing and shock-like specially in his lower extremities into the shins and feet. He did have a history of MRSA infection and subsequent lost his distal digit in his upper extremity. Describes the burning pain in his shins bilaterally as a "cattle prod". 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 Activities universallyworsen his pain. Frequent movements and adjusting himself and slowing downhis activitiesdoes ease things off.  He is not working because of disability.  Both legs are equal involved.  His pain continues to be stabbing and sharp with radiations up between shoulder blades and down  into his hips knees in lower extremities.  He cannot stand for long period of time.  Walking sitting in leg or twisting rolling worsens his pain.  He complains of worsening low back pain as well as leg, hip, and knee pains. He has "excruciating pain"between his shoulder blades. His sugars are better controlled now and he has been improved for an insulin pump. He continues on Plavix. He has not been established yet with pain management closer to home. He comes in for 3 month follow-up but with no new studies.  He had extreme claustrophobia and could not get the  cervical, thoracic, and lumbar spine scans performed.  He would not tolerate Valium sedation.      Patient Active Problem List   Diagnosis   . Spondyloarthropathy       Past Surgical History:   Procedure Laterality Date   . 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 HAND SURGERY Left    . HX BACK SURGERY      Low Back   . HX CERVICAL SPINE SURGERY     . HX LUMBAR SPINE SURGERY  2000    in [place]    . HX STENTING (ANY)  2010    cardiac stent x 4    . NECK SURGERY         acetaminophen (TYLENOL) 500 mg Oral Tablet, Take 500 mg by mouth Every 4 hours as needed for Pain  albuterol sulfate (PROVENTIL OR VENTOLIN OR PROAIR) 90 mcg/actuation Inhalation HFA Aerosol Inhaler, Take 1-2 Puffs by inhalation Every 6 hours as needed  atorvastatin (LIPITOR) 80 mg Oral Tablet, Take 1 Tab (80 mg total) by mouth Every evening  calcium carbonate (CALCIUM 300 ORAL), Take 1,000 mg by mouth Once a day  cetirizine (ZYRTEC) 10 mg Oral Tablet, Take 10 mg by mouth Once a day  cholecalciferol, vitamin D3, 1,000 unit Oral Tablet, Take 1,000 Units by mouth Once a day  cyanocobalamin (VITAMIN B 12) 1,000 mcg Oral Tablet, Take 1,000 mcg by mouth Once a day  fluticasone propionate (FLONASE) 50 mcg/actuation Nasal Spray, Suspension, 1 Spray by Each Nostril route Once a day  hydrOXYzine pamoate (VISTARIL) 50 mg Oral Capsule, Take 50 mg by mouth Three times a day as needed for Itching  isosorbide mononitrate (IMDUR) 60 mg Oral Tablet Sustained Release 24 hr, Take 1 Tab (60 mg total) by mouth Every morning  lidocaine (LIDODERM) 5 % Adhesive Patch, Medicated, 1 Patch by Transdermal route Once a day  magnesium oxide,aspartate,citr 400 mg magnesium Oral Capsule, Take by mouth Once a day  pantoprazole (PROTONIX) 40 mg Oral Tablet, Delayed Release (E.C.), Take 40 mg by mouth Once a day  pregabalin (LYRICA) 50 mg Oral Capsule, Take 50 mg by mouth Three times a day  zinc sulfate (ZINC-15 ORAL), Take by mouth Once a day  clopidogreL (PLAVIX) 75 mg  Oral Tablet, Take 1 Tab (75 mg total) by mouth Once a day  metoprolol tartrate (LOPRESSOR) 100 mg Oral Tablet, Take 1 Tab (100 mg total) by mouth Twice daily    No facility-administered medications prior to visit.      Allergies   Allergen Reactions   . Gabapentin  Other Adverse Reaction (Add comment)     hallucinations     . Morphine  Other Adverse Reaction (Add comment)     Hallucinations     . Tylenol Pm [Diphenhydramine-Acetaminophen]    . Glucophage [Metformin] Nausea/ Vomiting   . Keflex [  Cephalexin] Nausea/ Vomiting   . Victoza [Liraglutide] Nausea/ Vomiting       Family Medical History:     Problem Relation (Age of Onset)    Coronary Artery Disease Mother, Father    Diabetes Mother, Sister    Heart Attack Mother, Father    Stroke Mother, Father              Social History     Socioeconomic History   . Marital status: Single     Spouse name: Not on file   . Number of children: Not on file   . Years of education: Not on file   . Highest education level: Not on file   Tobacco Use   . Smoking status: Never Smoker   . Smokeless tobacco: Never Used   Substance and Sexual Activity   . Alcohol use: No   . Drug use: No   Other Topics Concern     Social Determinants of Health     Financial Resource Strain:    . Difficulty of Paying Living Expenses:    Food Insecurity:    . Worried About Charity fundraiser in the Last Year:    . Arboriculturist in the Last Year:    Transportation Needs:    . Film/video editor (Medical):    Marland Kitchen Lack of Transportation (Non-Medical):    Physical Activity:    . Days of Exercise per Week:    . Minutes of Exercise per Session:    Stress:    . Feeling of Stress :    Intimate Partner Violence:    . Fear of Current or Ex-Partner:    . Emotionally Abused:    Marland Kitchen Physically Abused:    . Sexually Abused:        REVIEW OF SYSTEMS:     12 systems reviewed and all are negative with the exception of what is listed in the HPI    PHYSICAL EXAM:    BP 128/73 (Site: Right, Patient Position: Sitting,  Cuff Size: Adult)   Pulse 94   Ht 1.727 m (5\' 8" )   Wt 100 kg (221 lb)   SpO2 99%   BMI 33.60 kg/m       Physical exam:Patient is in no acute distress. Ambulation into the room and about the room is somewhat impaired. There is chronic wasting of his left lower extremity.He has a fairly unsteady gait    HEENT: Normocephalic. Atraumatic. No crepitus. No nuchal tenderness. No facial asymmetry. Hears well bilateral with normal speech production as well. No malocclusion of the jaw. No TMJ pain elicited. Dentition intact. Mucous membranes moist. Conjunctiva clear. No erythema or exudates. No battle signs or raccoon's eyes. External ear canal without lesions. No otorrhea or rhinorrhea.    Neck: Good carotid upstrokes. No adenopathy. Trachea midline. No crepitus. No meningeal signs.    CV: No JVD. No murmurs. Regular pulse.    Pulmonary: No shortness of breath with muscle testing. No dyspnea with exertion or with talking. Equal air exchange. No dullness to percussion. No rib dysfunction with palpation. No chest wall deformity. No use of accessory muscles of respiration.    Abdomen: Soft, nontender, no masses, no guarding or rebound. No significant hernias.    Back: No tenderness to percussion. No point tenderness. Range-of-motion appropriate.    Musculoskeletal: Range of motion of shoulders is appropriate without crepitus or limitation. Muscle mass is decreased throughout especially distally. He has wasting of his intrinsics and  thenar and hypothenar eminences. He has a digit missing in the distal 4th finger on the left hand.Hips and knees show no swelling either. Dupuytren's contracture noted bilaterally, left greater than right.No palpable step-offs. No scoliotic deformities or excessive kyphosis. No soft tissue masses.    Neurologic: Alert oriented x4. Speech and affect appropriate. Speech output appropriate. Cognition clear. Thought processes and  cadence of speech appropriate. Cranial nerves 2-12 appropriate. Tongue without fasciculations. Motor strengthshows some asymmetry, distal upper lower extremities weaker than proximal, left side weaker in left lower extremity with greater than right lower extremitySensation impaired bilaterally again distally greater than proximally and left side worse than right. Loss of vibration and proprioception and pinprick and light touch discrepancies noted bilaterally. Fine motor control is slowed. No cerebellar dysfunction. Significantgait ataxiaexist but heambulates without assistance. Gets up from a seated position well. Memory and behavior intact.No spasticity no hypertonicity no crossed adductor     Peripheral vascular: Warm extremities bilaterally. Good pulses bilaterally.    Skin: No rashes or lesions.    Lymphatics: No adenopathy.          IMAGING:    I personally reviewed this film and radiology report, my interpretation is as follows:  Old studies from [facility] from 5, 15, 2019 reviewed    I have reviewed the following:Evidence of fused L5-S1 segment. The segment was operated on from a left-sided approach. Osteophytic disc noted. Facet arthropathy seen. Loss of muscle taken. L4-5 has similar findings with facet arthropathy postsurgical changes to the left. Appears to be evidence of nerve root clumping especially L4-5 5 1. Foraminal stenosis and central stenosis noted at L L3-4 with an asymmetric facet complex to the left. Moderate to severe stenosis noted here. Diffuse sarcopenia noted as well. No instability or lytic lesions noted       Lumbar x-rays showed diffuse lumbar spondylosis. Evidence of anterior osteophytes are seen. Facet arthropathy noted. No lytic or destructive lesions seen.  No spondylolisthesis  Studies from [facility] in [place] were reviewed.  They are poorly formatted in the computer over.  He has accentuated thoracic  kyphosis.  He has sagittal imbalance with forward head over his hips.  No severe thoracic cord compromise is seen.  Diffuse auto fusion occurring.  Lumbar spine shows fusion at L5-S1.  Disc desiccation seen at L3-4 and L4-5.  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  Facet arthropathy seen.  Kissing facets at L L3-4.  Postsurgical changes seen especially at L5-S1.  She cervical MRI scan shows circumferential anterior-posterior operations.  No evidence of cord compromise seen or myelomalacia in the cord.  Anterior posterior instrumentation is in place    ASSESSMENT:    ENCOUNTER DIAGNOSES     ICD-10-CM   1. Spondyloarthropathy  M47.819   2. Spinal stenosis of lumbar region without neurogenic claudication  M48.061   3. History of lumbar laminectomy for spinal cord decompression  Z98.890   4. Muscular atrophy, unspecified site  M62.50   5. Chronic neck pain  M54.2    G89.29   6. Chronic back pain  M54.9    G89.29   7. DISH (diffuse idiopathic skeletal hyperostosis)  M48.10   8. Discogenic thoracic pain  M54.6   9. Diabetic peripheral neuropathy (CMS HCC)  E11.42   10. Cardiac disease  I51.9   11. Poorly controlled diabetes mellitus (CMS HCC)  E11.65  12. Left foot drop  M21.372   13. Sensory ataxic gait  R26.0   14. History of MRSA infection  Z86.14   15. Lumbar stenosis  M48.061       PLAN:    Anothy has extensive complaints.  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  I would recommend no surgery form.  He does have sagittal imbalance.  Osteotomies and correction with extensive thoracolumbar fusion is not going to improve his overall pain.  He has diffuse symptomatology in his arms and his legs which cannot be explained by studies.  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  He does have stenosis with  facet arthropathy at the lower lumbar levels.  However I would again, not recommend surgery as it will not give him the relief he wants.  I will keep an eye on him and see him back in 1 year's time.  He understands implications of the stiffening of his spine. All films were extensively reviewed with the patient.  Pathology relating to the patient's condition were outlined on the films and implications to the physical exam and present presenting illness were discussed at length.  Ramifications of the studies were further elucidated by use of models and other devices.  Good understanding of the meaning of the rendered diagnosis or diagnoses was obtained.  All questions were answered that were posed.    Orders:  Pain management referral      The patient was given the opportunity to ask questions and those questions were answered to the patient's satisfaction. The patient was encouraged to call with any additional questions or concerns. Instructed patient to call back if symptoms worse.     FOLLOW UP:    Return in about 1 year (around 03/11/2020).     Portions of this note may be dictated using voice recognition software so variances in spelling, vocabulary, commands for voice recognition, and word or phase choices may be ambiguous, unclear, or unintelligible.   Not all  errors  or unclear areas are able to be  caught or corrected.  Please notify the office if any discrepancies are noted or if the meaning of any statements are on clear.          Kayren Eaves, MD

## 2019-06-05 ENCOUNTER — Ambulatory Visit (INDEPENDENT_AMBULATORY_CARE_PROVIDER_SITE_OTHER): Payer: Medicare PPO | Admitting: Cardiovascular Disease

## 2019-06-05 ENCOUNTER — Encounter (INDEPENDENT_AMBULATORY_CARE_PROVIDER_SITE_OTHER): Payer: Self-pay | Admitting: Cardiovascular Disease

## 2019-06-05 ENCOUNTER — Other Ambulatory Visit: Payer: Self-pay

## 2019-06-05 VITALS — BP 121/76 | HR 79 | Resp 17 | Ht 68.0 in | Wt 224.0 lb

## 2019-06-05 DIAGNOSIS — I679 Cerebrovascular disease, unspecified: Secondary | ICD-10-CM

## 2019-06-05 DIAGNOSIS — I2510 Atherosclerotic heart disease of native coronary artery without angina pectoris: Secondary | ICD-10-CM

## 2019-06-05 MED ORDER — ASPIRIN 81 MG TABLET,DELAYED RELEASE
81.0000 mg | DELAYED_RELEASE_TABLET | Freq: Every day | ORAL | Status: AC
Start: 2019-06-05 — End: ?

## 2019-06-05 MED ORDER — DULOXETINE 60 MG CAPSULE,DELAYED RELEASE
60.0000 mg | DELAYED_RELEASE_CAPSULE | Freq: Every day | ORAL | Status: DC
Start: 2019-06-05 — End: 2023-06-21

## 2019-06-05 MED ORDER — METFORMIN ER 500 MG TABLET,EXTENDED RELEASE 24 HR
500.0000 mg | ORAL_TABLET | Freq: Every day | ORAL | 0 refills | Status: DC
Start: 2019-06-05 — End: 2020-07-16
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
## 2019-06-06 ENCOUNTER — Encounter (INDEPENDENT_AMBULATORY_CARE_PROVIDER_SITE_OTHER): Payer: Medicare PPO | Admitting: Cardiovascular Disease

## 2019-06-06 ENCOUNTER — Encounter (INDEPENDENT_AMBULATORY_CARE_PROVIDER_SITE_OTHER): Payer: Self-pay | Admitting: Cardiovascular Disease

## 2019-06-17 ENCOUNTER — Other Ambulatory Visit (INDEPENDENT_AMBULATORY_CARE_PROVIDER_SITE_OTHER): Payer: Self-pay | Admitting: Cardiovascular Disease

## 2019-06-17 MED ORDER — ATORVASTATIN 80 MG TABLET
80.00 mg | ORAL_TABLET | Freq: Every evening | ORAL | 3 refills | Status: DC
Start: 2019-06-17 — End: 2020-08-13

## 2019-06-21 ENCOUNTER — Encounter (INDEPENDENT_AMBULATORY_CARE_PROVIDER_SITE_OTHER): Payer: Self-pay
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
## 2019-07-02 ENCOUNTER — Other Ambulatory Visit: Payer: Self-pay

## 2019-07-02 ENCOUNTER — Encounter (INDEPENDENT_AMBULATORY_CARE_PROVIDER_SITE_OTHER): Payer: Self-pay

## 2019-07-02 ENCOUNTER — Ambulatory Visit (INDEPENDENT_AMBULATORY_CARE_PROVIDER_SITE_OTHER): Payer: Medicare PPO

## 2019-07-02 VITALS — BP 122/80 | HR 94 | Resp 18 | Ht 68.0 in | Wt 224.0 lb

## 2019-07-02 DIAGNOSIS — M549 Dorsalgia, unspecified: Secondary | ICD-10-CM

## 2019-07-02 DIAGNOSIS — M47817 Spondylosis without myelopathy or radiculopathy, lumbosacral region: Secondary | ICD-10-CM

## 2019-07-02 DIAGNOSIS — Z9889 Other specified postprocedural states: Secondary | ICD-10-CM

## 2019-07-02 DIAGNOSIS — G8929 Other chronic pain: Secondary | ICD-10-CM

## 2019-07-02 DIAGNOSIS — M533 Sacrococcygeal disorders, not elsewhere classified: Secondary | ICD-10-CM

## 2019-07-02 DIAGNOSIS — M47819 Spondylosis without myelopathy or radiculopathy, site unspecified: Secondary | ICD-10-CM

## 2019-07-02 DIAGNOSIS — I519 Heart disease, unspecified: Secondary | ICD-10-CM

## 2019-07-02 DIAGNOSIS — R260 Ataxic gait: Secondary | ICD-10-CM

## 2019-07-02 DIAGNOSIS — M7918 Myalgia, other site: Secondary | ICD-10-CM

## 2019-07-02 DIAGNOSIS — E1142 Type 2 diabetes mellitus with diabetic polyneuropathy: Secondary | ICD-10-CM

## 2019-07-02 DIAGNOSIS — M21372 Foot drop, left foot: Secondary | ICD-10-CM

## 2019-07-02 DIAGNOSIS — M546 Pain in thoracic spine: Secondary | ICD-10-CM

## 2019-07-02 DIAGNOSIS — E1165 Type 2 diabetes mellitus with hyperglycemia: Secondary | ICD-10-CM

## 2019-07-02 DIAGNOSIS — M5442 Lumbago with sciatica, left side: Secondary | ICD-10-CM

## 2019-07-02 DIAGNOSIS — M7062 Trochanteric bursitis, left hip: Secondary | ICD-10-CM

## 2019-07-02 DIAGNOSIS — M48061 Spinal stenosis, lumbar region without neurogenic claudication: Secondary | ICD-10-CM

## 2019-07-02 DIAGNOSIS — M6250 Muscle wasting and atrophy, not elsewhere classified, unspecified site: Secondary | ICD-10-CM

## 2019-07-02 DIAGNOSIS — Z8614 Personal history of Methicillin resistant Staphylococcus aureus infection: Secondary | ICD-10-CM

## 2019-07-02 DIAGNOSIS — M47816 Spondylosis without myelopathy or radiculopathy, lumbar region: Secondary | ICD-10-CM

## 2019-07-02 DIAGNOSIS — M542 Cervicalgia: Secondary | ICD-10-CM

## 2019-07-02 DIAGNOSIS — M4810 Ankylosing hyperostosis [Forestier], site unspecified: Secondary | ICD-10-CM

## 2019-07-02 MED ORDER — PREDNISONE 10 MG TABLET
ORAL_TABLET | ORAL | 0 refills | Status: DC
Start: 2019-07-02 — End: 2020-07-16

## 2019-07-16 ENCOUNTER — Ambulatory Visit: Payer: Medicare PPO | Attending: Internal Medicine | Admitting: Internal Medicine

## 2019-07-16 DIAGNOSIS — E232 Diabetes insipidus: Secondary | ICD-10-CM | POA: Insufficient documentation

## 2019-07-16 DIAGNOSIS — R112 Nausea with vomiting, unspecified: Secondary | ICD-10-CM

## 2019-07-16 LAB — COMPREHENSIVE METABOLIC PANEL, NON-FASTING
ALBUMIN: 3.9 g/dL (ref 3.5–5.0)
ALKALINE PHOSPHATASE: 65 U/L (ref 38–126)
ALT (SGPT): 49 U/L (ref <=50)
ANION GAP: 5 mmol/L — ABNORMAL LOW (ref 10–20)
AST (SGOT): 74 U/L — ABNORMAL HIGH (ref 17–59)
BILIRUBIN TOTAL: 4.4 mg/dL — ABNORMAL HIGH (ref 0.3–1.3)
BUN/CREA RATIO: 16
BUN: 16 mg/dL (ref 8–25)
CALCIUM: 9.2 mg/dL (ref 8.5–10.4)
CHLORIDE: 102 mmol/L (ref 98–107)
CO2 TOTAL: 30 mmol/L (ref 22–32)
CREATININE: 1 mg/dL (ref 0.62–1.27)
ESTIMATED GFR: 79 mL/min/{1.73_m2}
GLUCOSE: 137 mg/dL — ABNORMAL HIGH (ref 70–105)
POTASSIUM: 4.3 mmol/L (ref 3.5–5.1)
PROTEIN TOTAL: 6.9 g/dL (ref 6.0–8.0)
SODIUM: 137 mmol/L (ref 137–145)

## 2019-07-16 LAB — AMYLASE: AMYLASE: 81 U/L (ref 25–125)

## 2019-07-16 LAB — LIPASE: LIPASE: 102 U/L (ref 23–300)

## 2019-07-16 LAB — BILIRUBIN, CONJUGATED (DIRECT): BILIRUBIN DIRECT: 0.8 mg/dL — ABNORMAL HIGH (ref 0.0–0.2)

## 2019-07-22 ENCOUNTER — Encounter (INDEPENDENT_AMBULATORY_CARE_PROVIDER_SITE_OTHER): Payer: Self-pay

## 2019-07-29 ENCOUNTER — Encounter (HOSPITAL_BASED_OUTPATIENT_CLINIC_OR_DEPARTMENT_OTHER): Admission: RE | Payer: Self-pay | Source: Ambulatory Visit

## 2019-07-29 ENCOUNTER — Ambulatory Visit (HOSPITAL_BASED_OUTPATIENT_CLINIC_OR_DEPARTMENT_OTHER)
Admission: RE | Admit: 2019-07-29 | Payer: Medicare PPO | Source: Ambulatory Visit | Admitting: PHYSICAL MEDICINE AND REHABILITATION

## 2019-07-29 ENCOUNTER — Other Ambulatory Visit (INDEPENDENT_AMBULATORY_CARE_PROVIDER_SITE_OTHER): Payer: Self-pay

## 2019-07-29 DIAGNOSIS — G9589 Other specified diseases of spinal cord: Secondary | ICD-10-CM

## 2019-07-29 DIAGNOSIS — Z981 Arthrodesis status: Secondary | ICD-10-CM

## 2019-07-29 SURGERY — LUMBOSACRAL SELECTIVE NERVE ROOT BLOCK
Laterality: Left

## 2019-08-06 ENCOUNTER — Ambulatory Visit (RURAL_HEALTH_CENTER): Payer: Self-pay | Admitting: Primary Podiatric Medicine

## 2019-08-07 ENCOUNTER — Telehealth (INDEPENDENT_AMBULATORY_CARE_PROVIDER_SITE_OTHER): Payer: Self-pay

## 2019-08-07 NOTE — Telephone Encounter (Signed)
Contacted patient to inform them their imaging orders have been approved and they may call scheduling to make appointment. I let patient know when their follow up appointment was with our provider so they could have imaging completed prior to appointment.  Orders, demographics and authorization approval has been faxed to facility of patients choice.

## 2019-08-21 ENCOUNTER — Encounter (FREE_STANDING_LABORATORY_FACILITY)
Admit: 2019-08-21 | Discharge: 2019-08-21 | Disposition: A | Discharge status: Home / Self Care | Payer: Self-pay | Attending: Surgery | Admitting: Surgery

## 2019-08-21 ENCOUNTER — Other Ambulatory Visit (HOSPITAL_BASED_OUTPATIENT_CLINIC_OR_DEPARTMENT_OTHER): Payer: Medicare PPO | Admitting: Surgery

## 2019-08-21 DIAGNOSIS — K8010 Calculus of gallbladder with chronic cholecystitis without obstruction: Secondary | ICD-10-CM

## 2019-08-21 HISTORY — PX: HX GALL BLADDER SURGERY/CHOLE: SHX55

## 2019-08-22 ENCOUNTER — Encounter (INDEPENDENT_AMBULATORY_CARE_PROVIDER_SITE_OTHER): Payer: Self-pay

## 2019-08-22 ENCOUNTER — Encounter (INDEPENDENT_AMBULATORY_CARE_PROVIDER_SITE_OTHER): Payer: Medicare PPO

## 2019-08-22 DIAGNOSIS — Z4509 Encounter for adjustment and management of other cardiac device: Secondary | ICD-10-CM

## 2019-08-23 LAB — SURGICAL PATHOLOGY SPECIMEN

## 2019-09-04 ENCOUNTER — Other Ambulatory Visit (INDEPENDENT_AMBULATORY_CARE_PROVIDER_SITE_OTHER): Payer: Self-pay

## 2019-09-04 ENCOUNTER — Inpatient Hospital Stay (HOSPITAL_COMMUNITY): Admit: 2019-09-04 | Discharge: 2019-09-04 | Disposition: A | Discharge status: Home / Self Care | Payer: Self-pay

## 2019-09-04 DIAGNOSIS — G9589 Other specified diseases of spinal cord: Secondary | ICD-10-CM

## 2019-09-10 ENCOUNTER — Other Ambulatory Visit (INDEPENDENT_AMBULATORY_CARE_PROVIDER_SITE_OTHER): Payer: Self-pay | Admitting: NURSE PRACTITIONER, FAMILY

## 2019-09-10 DIAGNOSIS — I679 Cerebrovascular disease, unspecified: Secondary | ICD-10-CM

## 2019-09-10 DIAGNOSIS — I639 Cerebral infarction, unspecified: Secondary | ICD-10-CM

## 2019-09-10 MED ORDER — METOPROLOL TARTRATE 100 MG TABLET
100.00 mg | ORAL_TABLET | Freq: Two times a day (BID) | ORAL | 1 refills | Status: DC
Start: 2019-09-10 — End: 2020-06-18

## 2019-09-10 MED ORDER — CLOPIDOGREL 75 MG TABLET
75.00 mg | ORAL_TABLET | Freq: Every day | ORAL | 1 refills | Status: DC
Start: 2019-09-10 — End: 2020-08-07

## 2019-09-11 ENCOUNTER — Ambulatory Visit (INDEPENDENT_AMBULATORY_CARE_PROVIDER_SITE_OTHER): Payer: Self-pay | Admitting: Physician Assistant

## 2019-09-15 ENCOUNTER — Ambulatory Visit
Admission: RE | Admit: 2019-09-15 | Discharge: 2019-09-15 | Disposition: A | Discharge status: Home / Self Care | Payer: Medicare (Managed Care) | Source: Ambulatory Visit | Attending: Gastroenterology | Admitting: Gastroenterology

## 2019-09-15 DIAGNOSIS — Z20822 Contact with and (suspected) exposure to covid-19: Secondary | ICD-10-CM | POA: Insufficient documentation

## 2019-09-15 LAB — VH SOLANA (R) SARS-COV-2 MOLECULAR ASSAY
Does patient have symptoms related to condition of interest?: NEGATIVE
Does patient reside in a congregate care setting?: NEGATIVE
Is patient admitted to the intensive care unit?: NEGATIVE
Is patient employed in a healthcare setting?: NEGATIVE
Is the patient hospitalized because of this condition?: NEGATIVE
Solana (R) SARS-Cov-2: NEGATIVE

## 2019-09-17 ENCOUNTER — Encounter (INDEPENDENT_AMBULATORY_CARE_PROVIDER_SITE_OTHER): Payer: Self-pay | Admitting: NEUROSURGERY

## 2019-09-17 ENCOUNTER — Other Ambulatory Visit: Payer: Self-pay

## 2019-09-17 ENCOUNTER — Other Ambulatory Visit (HOSPITAL_BASED_OUTPATIENT_CLINIC_OR_DEPARTMENT_OTHER): Payer: Self-pay

## 2019-09-17 ENCOUNTER — Ambulatory Visit (INDEPENDENT_AMBULATORY_CARE_PROVIDER_SITE_OTHER): Payer: Medicare PPO | Admitting: NEUROSURGERY

## 2019-09-17 VITALS — BP 118/83 | HR 83 | Ht 68.0 in | Wt 224.0 lb

## 2019-09-17 DIAGNOSIS — M21372 Foot drop, left foot: Secondary | ICD-10-CM

## 2019-09-17 DIAGNOSIS — M549 Dorsalgia, unspecified: Secondary | ICD-10-CM

## 2019-09-17 DIAGNOSIS — R260 Ataxic gait: Secondary | ICD-10-CM

## 2019-09-17 DIAGNOSIS — M48061 Spinal stenosis, lumbar region without neurogenic claudication: Secondary | ICD-10-CM

## 2019-09-17 DIAGNOSIS — Z8614 Personal history of Methicillin resistant Staphylococcus aureus infection: Secondary | ICD-10-CM

## 2019-09-17 DIAGNOSIS — F40240 Claustrophobia: Secondary | ICD-10-CM

## 2019-09-17 DIAGNOSIS — E1142 Type 2 diabetes mellitus with diabetic polyneuropathy: Secondary | ICD-10-CM

## 2019-09-17 DIAGNOSIS — Z9889 Other specified postprocedural states: Secondary | ICD-10-CM

## 2019-09-17 DIAGNOSIS — M546 Pain in thoracic spine: Secondary | ICD-10-CM

## 2019-09-17 DIAGNOSIS — M542 Cervicalgia: Secondary | ICD-10-CM

## 2019-09-17 DIAGNOSIS — M4810 Ankylosing hyperostosis [Forestier], site unspecified: Secondary | ICD-10-CM

## 2019-09-17 DIAGNOSIS — G8929 Other chronic pain: Secondary | ICD-10-CM

## 2019-09-17 DIAGNOSIS — M6250 Muscle wasting and atrophy, not elsewhere classified, unspecified site: Secondary | ICD-10-CM

## 2019-09-17 DIAGNOSIS — G9589 Other specified diseases of spinal cord: Secondary | ICD-10-CM

## 2019-09-17 DIAGNOSIS — M47819 Spondylosis without myelopathy or radiculopathy, site unspecified: Secondary | ICD-10-CM

## 2019-09-17 DIAGNOSIS — I519 Heart disease, unspecified: Secondary | ICD-10-CM

## 2019-09-17 DIAGNOSIS — E1165 Type 2 diabetes mellitus with hyperglycemia: Secondary | ICD-10-CM

## 2019-09-18 ENCOUNTER — Encounter (HOSPITAL_BASED_OUTPATIENT_CLINIC_OR_DEPARTMENT_OTHER): Payer: Self-pay | Admitting: Gastroenterology

## 2019-09-18 ENCOUNTER — Ambulatory Visit (HOSPITAL_BASED_OUTPATIENT_CLINIC_OR_DEPARTMENT_OTHER): Payer: Medicare (Managed Care) | Admitting: Anesthesiology

## 2019-09-18 ENCOUNTER — Ambulatory Visit
Admission: RE | Admit: 2019-09-18 | Discharge: 2019-09-18 | Disposition: A | Discharge status: Home / Self Care | Payer: Medicare (Managed Care) | Attending: Gastroenterology | Admitting: Gastroenterology

## 2019-09-18 ENCOUNTER — Encounter (HOSPITAL_BASED_OUTPATIENT_CLINIC_OR_DEPARTMENT_OTHER)
Admission: RE | Disposition: A | Discharge status: Home / Self Care | Payer: Self-pay | Source: Home / Self Care | Attending: Gastroenterology

## 2019-09-18 ENCOUNTER — Encounter: Payer: Self-pay | Admitting: Medical

## 2019-09-18 DIAGNOSIS — R1319 Other dysphagia: Secondary | ICD-10-CM | POA: Insufficient documentation

## 2019-09-18 DIAGNOSIS — E1310 Other specified diabetes mellitus with ketoacidosis without coma: Secondary | ICD-10-CM

## 2019-09-18 DIAGNOSIS — E1165 Type 2 diabetes mellitus with hyperglycemia: Secondary | ICD-10-CM

## 2019-09-18 DIAGNOSIS — R0789 Other chest pain: Secondary | ICD-10-CM

## 2019-09-18 DIAGNOSIS — E785 Hyperlipidemia, unspecified: Secondary | ICD-10-CM

## 2019-09-18 DIAGNOSIS — Z4681 Encounter for fitting and adjustment of insulin pump: Secondary | ICD-10-CM

## 2019-09-18 DIAGNOSIS — I10 Essential (primary) hypertension: Secondary | ICD-10-CM

## 2019-09-18 DIAGNOSIS — Z8673 Personal history of transient ischemic attack (TIA), and cerebral infarction without residual deficits: Secondary | ICD-10-CM | POA: Insufficient documentation

## 2019-09-18 DIAGNOSIS — I2510 Atherosclerotic heart disease of native coronary artery without angina pectoris: Secondary | ICD-10-CM | POA: Insufficient documentation

## 2019-09-18 DIAGNOSIS — E1140 Type 2 diabetes mellitus with diabetic neuropathy, unspecified: Secondary | ICD-10-CM | POA: Insufficient documentation

## 2019-09-18 HISTORY — PX: EGD: SHX3789

## 2019-09-18 SURGERY — DONT USE, USE 1095-ESOPHAGOGASTRODUODENOSCOPY (EGD), DIAGNOSTIC
Anesthesia: Anesthesia MAC / Sedation | Site: Abdomen | Wound class: Clean Contaminated

## 2019-09-18 MED ORDER — SODIUM CHLORIDE (PF) 0.9 % IJ SOLN
3.0000 mL | Freq: Three times a day (TID) | INTRAMUSCULAR | Status: DC
Start: 2019-09-18 — End: 2019-09-18

## 2019-09-18 MED ORDER — PROPOFOL 200 MG/20ML IV EMUL
INTRAVENOUS | Status: DC | PRN
Start: 2019-09-18 — End: 2019-09-18
  Administered 2019-09-18 (×2): 100 mg via INTRAVENOUS

## 2019-09-18 MED ORDER — SODIUM CHLORIDE 0.9 % IV SOLN
INTRAVENOUS | Status: DC
Start: 2019-09-18 — End: 2019-09-18

## 2019-09-18 MED ORDER — ONDANSETRON HCL 4 MG/2ML IJ SOLN
4.0000 mg | Freq: Once | INTRAMUSCULAR | Status: DC | PRN
Start: 2019-09-18 — End: 2019-09-18

## 2019-09-18 MED ORDER — ONDANSETRON 4 MG PO TBDP
4.0000 mg | ORAL_TABLET | Freq: Once | ORAL | Status: DC | PRN
Start: 2019-09-18 — End: 2019-09-18

## 2019-09-18 MED ORDER — FENTANYL CITRATE (PF) 50 MCG/ML IJ SOLN (WRAP)
INTRAMUSCULAR | Status: AC
Start: 2019-09-18 — End: ?
  Filled 2019-09-18: qty 2

## 2019-09-18 MED ORDER — PROPOFOL 200 MG/20ML IV EMUL
INTRAVENOUS | Status: AC
Start: 2019-09-18 — End: ?
  Filled 2019-09-18: qty 20

## 2019-09-18 MED ORDER — FENTANYL CITRATE (PF) 50 MCG/ML IJ SOLN (WRAP)
INTRAMUSCULAR | Status: DC | PRN
Start: 2019-09-18 — End: 2019-09-18
  Administered 2019-09-18: 100 ug via INTRAVENOUS

## 2019-09-18 MED ORDER — SODIUM CHLORIDE 0.9 % IV SOLN
INTRAVENOUS | Status: DC | PRN
Start: 2019-09-18 — End: 2019-09-18

## 2019-09-18 MED ORDER — ALBUTEROL SULFATE (2.5 MG/3ML) 0.083% IN NEBU
2.5000 mg | INHALATION_SOLUTION | RESPIRATORY_TRACT | Status: DC | PRN
Start: 2019-09-18 — End: 2019-09-18

## 2019-09-18 SURGICAL SUPPLY — 65 items
APOLLO TISSUE HELIX (Supply) IMPLANT
BASKET MED RETRIEV HEX 45X15 (Supply) IMPLANT
BRUSH CLEANING COMBINATION (Supply) ×1
BRUSH HEDGEHOG DUAL END (Supply) ×1 IMPLANT
CAPSULE BRAVO (Supply) IMPLANT
CATH BALLOON DILATATION 5837 (Supply) IMPLANT
CATH BARRX 360 RFA EXPRESS (Supply) IMPLANT
CATH BARRX 60 RFA FOCA (Supply) IMPLANT
CATH BARRX 90 RFA FOCAL (Supply) IMPLANT
CATH BARRX ULTRA-LONG RFA FOCA (Supply) IMPLANT
CATH CROSPON ENDO FLIP (Supply) IMPLANT
CATH GLD PROBE BICAP 7FX210CM (Supply) IMPLANT
CATH GLD PROBE BICAP 7FX300CM (Supply) IMPLANT
CATH GOLD PROBE 10FR (Supply) IMPLANT
CATH GOLD PROBE INJECTION 10F (Supply) IMPLANT
CLEANER ENZYMATIC BEDSIDE (Supply) ×2 IMPLANT
CLIP RESOLUTION 360 (Supply) IMPLANT
CONNECTOR QUICK PORT (Supply) ×2 IMPLANT
CRE BALLOON 10-12 DILATOR 5835 (Supply) IMPLANT
CRE BALLOON 12-15 DILATOR 5836 (Supply) IMPLANT
CRE BALLOON 18-20 DILATOR 5838 (Supply) IMPLANT
CRE BALLOON 6-8 DILAT 5833 (Supply) IMPLANT
CRE BALLOON 8-10 DILAT 5834 (Supply) IMPLANT
CRE ESOPH PYLORIC COL 10-12 (Supply) IMPLANT
CRE ESOPH PYLORIC COL 12-15 (Supply) IMPLANT
CRE ESOPH PYLORIC COL 15-18 (Supply) IMPLANT
CRE ESOPH PYLORIC COL 18-20 (Supply) IMPLANT
CRE ESOPH PYLORIC COL 6-8 (Supply) IMPLANT
CRE ESOPH PYLORIC COL 8-10 (Supply) IMPLANT
CRE PYLORIC COL 10-12 DIL 5847 (Supply) IMPLANT
CRE PYLORIC COL 12-15 DIL 5848 (Supply) IMPLANT
CRE PYLORIC COL 15-18 DIL 5849 (Supply) IMPLANT
CRE PYLORIC COL 8-10 DIL 5846 (Supply) IMPLANT
DEVICE GRASP RAPTOR 2.4X160 (Supply) IMPLANT
DEVICE TALON GRASP 2.4X160 (Supply) IMPLANT
DIL CRE 18-20 PYL CLN 5850 (Supply) IMPLANT
DREAMWIRE STR .035 450CM (Supply) IMPLANT
FORCEP BIOPSY HOT RADIAL JAW 4 (Supply) IMPLANT
FORCEP BIOPSY RAD JAW 1333-40 (Supply) IMPLANT
FORCEP RAT TOOTH GRASP 2.4 (Supply) IMPLANT
FORCEP RESCUE RAT/ALL 8X230 (Supply) IMPLANT
HEMOSTAT ENDO HEMOSPRAY 5G (Supply) IMPLANT
HEMOSTAT ENDO HEMOSPRAY 7FR (Supply)
KIT PEG 18 FR (Supply) IMPLANT
KIT STD PEG 20FR PULL (Supply) IMPLANT
MARKER ENDOSCOPIC SPOT (Supply) IMPLANT
NDL INTERJECT SCLERO 25G (Supply) IMPLANT
OVERTUBE GUARD ESOPH 10.0-17.9 (Supply) IMPLANT
OVERTUBE GUARD ESOPH 8.6-10.00 (Supply) IMPLANT
PAD CLINCH ENDO TRASPORT (Supply) ×2 IMPLANT
PEG TUBE 18F GASTRO ENDOV (Supply) IMPLANT
PROBE CIRCUMFRENTIAL (Supply) IMPLANT
PROBE SIDE FIRE (Supply) IMPLANT
PROBE STRAIGHT FIRE APC (Supply) IMPLANT
RESCUENET RETRIEVAL  2.5X230 (Supply) IMPLANT
SCISSOR ENZIZOR 2.6MMX236CM (Supply) IMPLANT
SNARE CAPTIVATOR ERM (Supply) IMPLANT
SPEEDBAND (Supply) IMPLANT
SUT APOLLO 2.0 (Supply) IMPLANT
SUT APOLLO OVERSTITCH CINCH (Supply) IMPLANT
SYS APOLLO OVERSTITCH SUTURE (Supply) IMPLANT
SYS APOLLO OVERTUBE ACCESS (Supply) IMPLANT
SYSTEM INFLATION ALLIANCE II (Supply) IMPLANT
VALVE DISP CLEANING BIOGUARD (Supply) ×2 IMPLANT
VALVE OLYMPUS DISP A/W/S/ BIO (Supply) ×2 IMPLANT

## 2019-09-18 NOTE — Discharge Instr - AVS First Page (Signed)
Endoscopy Discharge Instructions      COVID  Thank you for allowing us to provide you care during this evolving time. For questions regarding your test results we encourage you to call the office of the physician who performed your exam.     After you leave the hospital:  1. Due to the effects of the sedatives, you may feel tired for the remainder of today.   2. We recommend you go home after discharge  3. It is advisable to have supervision or access to seek help for a few hours after discharge  4. Do not drive, operate machinery, or sign important documents until tomorrow.  5. Please rest and drink extra fluids today.   6.  Avoid alcohol today.  7. Start with light easily tolerated foods advance to a regular diet as tolerated.  8. Resume your normal activities / work tomorrow    When to seek help -     Nausea / Vomiting: - try small amounts of bland foods like crackers or toast, & liquids such as soda, electrolyte replacement drinks or ice chips. Call for:  • New onset or ongoing nausea and vomiting   • The symptoms worsen - cold skin, confusion, blood or unexplainable black appearing vomit  Bleeding:  • Passing of blood or clots  or a change in stool consistency and or black in color  • Vomiting or spitting up blood  Infection:  • Redness or swelling at the IV site that continues to worsen. Initial warm compress may help.  • Fever  Pain / other:  • New onset of pain that does not subside over time or prevents you from normal activity or eating  • Shortness of breath or breathing trouble  • Weakness, feeling faint, passing out  • Swollen or distended abdomen    During business hours call your physician’s office.   After-hours call [facility] [phone] and a physician will be contacted

## 2019-09-18 NOTE — Anesthesia Preprocedure Evaluation (Signed)
Anesthesia Evaluation    AIRWAY    Mallampati: I    TM distance: >3 FB  Neck ROM: full  Mouth Opening:full   CARDIOVASCULAR    cardiovascular exam normal       DENTAL        (+) upper dentures     PULMONARY    pulmonary exam normal     OTHER FINDINGS                  Relevant Problems   NEURO/PSYCH   (+) TIA (transient ischemic attack)      CARDIO   (+) CAD (coronary artery disease)   (+) Hypertension   (+) Unstable angina      ENDO   (+) Type 2 diabetes mellitus   (+) Type 2 diabetes mellitus with diabetic neuropathy               Anesthesia Plan    ASA 3     MAC                                 informed consent obtained      pertinent labs reviewed             Signed by: Erven Colla 09/18/19 11:28 AM

## 2019-09-18 NOTE — Anesthesia Postprocedure Evaluation (Signed)
Anesthesia Post Evaluation    Patient: Michael [name]    Procedure(s):  EGD    Anesthesia type: MAC  Anesthesia Post Evaluation:     Patient Evaluated: PACU  Patient Participation: complete - patient participated  Level of Consciousness: awake    Pain Management: adequate    Airway Patency: patent    Anesthetic complications: No      PONV Status: none    Cardiovascular status: acceptable  Respiratory status: acceptable  Hydration status: acceptable        Signed by: Erven Colla, 09/18/2019 11:37 AM

## 2019-09-18 NOTE — Transfer of Care (Signed)
Anesthesia Transfer of Care Note    Patient: Michael [name]    Last vitals:   Vitals:    09/18/19 1136   BP: 138/84   Pulse: 75   Resp: 11   Temp: 36   SpO2: 100       Oxygen: Nasal Cannula     Mental Status:sedated    Airway: Natural    Cardiovascular Status:  stable

## 2019-09-19 ENCOUNTER — Encounter (HOSPITAL_BASED_OUTPATIENT_CLINIC_OR_DEPARTMENT_OTHER): Payer: Self-pay | Admitting: Gastroenterology

## 2019-09-22 ENCOUNTER — Encounter (INDEPENDENT_AMBULATORY_CARE_PROVIDER_SITE_OTHER): Payer: Self-pay

## 2019-09-22 DIAGNOSIS — Z4509 Encounter for adjustment and management of other cardiac device: Secondary | ICD-10-CM

## 2019-09-22 DIAGNOSIS — Z95818 Presence of other cardiac implants and grafts: Secondary | ICD-10-CM
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
## 2019-10-17 ENCOUNTER — Other Ambulatory Visit: Payer: Self-pay

## 2019-10-17 ENCOUNTER — Ambulatory Visit (INDEPENDENT_AMBULATORY_CARE_PROVIDER_SITE_OTHER): Payer: Medicare PPO

## 2019-10-17 ENCOUNTER — Encounter (INDEPENDENT_AMBULATORY_CARE_PROVIDER_SITE_OTHER): Payer: Self-pay

## 2019-10-17 VITALS — BP 123/83 | HR 82 | Resp 18 | Ht 68.0 in | Wt 224.0 lb

## 2019-10-17 DIAGNOSIS — M47816 Spondylosis without myelopathy or radiculopathy, lumbar region: Secondary | ICD-10-CM

## 2019-10-17 DIAGNOSIS — Z9889 Other specified postprocedural states: Secondary | ICD-10-CM

## 2019-10-17 DIAGNOSIS — G8929 Other chronic pain: Secondary | ICD-10-CM

## 2019-10-17 DIAGNOSIS — M7062 Trochanteric bursitis, left hip: Secondary | ICD-10-CM

## 2019-10-17 DIAGNOSIS — M7918 Myalgia, other site: Secondary | ICD-10-CM

## 2019-10-17 DIAGNOSIS — M7061 Trochanteric bursitis, right hip: Secondary | ICD-10-CM

## 2019-10-17 DIAGNOSIS — Z7901 Long term (current) use of anticoagulants: Secondary | ICD-10-CM

## 2019-10-17 DIAGNOSIS — M47817 Spondylosis without myelopathy or radiculopathy, lumbosacral region: Secondary | ICD-10-CM

## 2019-10-17 DIAGNOSIS — M533 Sacrococcygeal disorders, not elsewhere classified: Secondary | ICD-10-CM

## 2019-10-17 DIAGNOSIS — M5442 Lumbago with sciatica, left side: Secondary | ICD-10-CM

## 2019-10-17 NOTE — Nursing Note (Signed)
10/17/19 1400   Oswestry LBP    Pain Intensity  3   Personal Care 2   Lifting 1   Walking 3   Sitting 3   Standing 3   Sleeping 3   Sex Life 4   Social Life 4   Traveling 1   ODI Total Score Oswestry LBP 27   Disability Percentage 54   Maple Mirza, RN  10/17/2019, 14:57

## 2019-10-17 NOTE — Nursing Note (Signed)
10/17/19 1400   NECK DISABILITY INDEX   Pain Intensity 3   Personal Care (Washing, Dressing, etc.) 2   Lifting 1   Reading 3   Headaches 3   Concentration 3   Work 2   Driving 2   Sleeping 4   Recreation 4   Score 27   Neck Disability Percentage 54   Maple Mirza, RN  10/17/2019, 14:57

## 2019-10-31 ENCOUNTER — Observation Stay
Admission: EM | Admit: 2019-10-31 | Discharge: 2019-11-01 | Disposition: A | Discharge status: Home / Self Care | Payer: No Typology Code available for payment source | Attending: Emergency Medicine | Admitting: Emergency Medicine

## 2019-10-31 ENCOUNTER — Ambulatory Visit
Payer: No Typology Code available for payment source | Attending: Primary Podiatric Medicine | Admitting: Primary Podiatric Medicine

## 2019-10-31 ENCOUNTER — Emergency Department: Payer: No Typology Code available for payment source

## 2019-10-31 ENCOUNTER — Encounter (RURAL_HEALTH_CENTER): Payer: Self-pay | Admitting: Primary Podiatric Medicine

## 2019-10-31 VITALS — HR 100 | Temp 97.1°F | Resp 17 | Wt 228.6 lb

## 2019-10-31 DIAGNOSIS — Z7902 Long term (current) use of antithrombotics/antiplatelets: Secondary | ICD-10-CM | POA: Insufficient documentation

## 2019-10-31 DIAGNOSIS — Z9641 Presence of insulin pump (external) (internal): Secondary | ICD-10-CM | POA: Insufficient documentation

## 2019-10-31 DIAGNOSIS — R0602 Shortness of breath: Secondary | ICD-10-CM | POA: Insufficient documentation

## 2019-10-31 DIAGNOSIS — L600 Ingrowing nail: Secondary | ICD-10-CM

## 2019-10-31 DIAGNOSIS — I2510 Atherosclerotic heart disease of native coronary artery without angina pectoris: Secondary | ICD-10-CM | POA: Insufficient documentation

## 2019-10-31 DIAGNOSIS — Z9049 Acquired absence of other specified parts of digestive tract: Secondary | ICD-10-CM | POA: Insufficient documentation

## 2019-10-31 DIAGNOSIS — Z794 Long term (current) use of insulin: Secondary | ICD-10-CM | POA: Insufficient documentation

## 2019-10-31 DIAGNOSIS — R42 Dizziness and giddiness: Secondary | ICD-10-CM | POA: Insufficient documentation

## 2019-10-31 DIAGNOSIS — L851 Acquired keratosis [keratoderma] palmaris et plantaris: Secondary | ICD-10-CM

## 2019-10-31 DIAGNOSIS — M79671 Pain in right foot: Secondary | ICD-10-CM

## 2019-10-31 DIAGNOSIS — N400 Enlarged prostate without lower urinary tract symptoms: Secondary | ICD-10-CM | POA: Insufficient documentation

## 2019-10-31 DIAGNOSIS — M79672 Pain in left foot: Secondary | ICD-10-CM

## 2019-10-31 DIAGNOSIS — I252 Old myocardial infarction: Secondary | ICD-10-CM | POA: Insufficient documentation

## 2019-10-31 DIAGNOSIS — Z888 Allergy status to other drugs, medicaments and biological substances status: Secondary | ICD-10-CM | POA: Insufficient documentation

## 2019-10-31 DIAGNOSIS — I10 Essential (primary) hypertension: Secondary | ICD-10-CM | POA: Insufficient documentation

## 2019-10-31 DIAGNOSIS — R202 Paresthesia of skin: Secondary | ICD-10-CM | POA: Insufficient documentation

## 2019-10-31 DIAGNOSIS — R079 Chest pain, unspecified: Principal | ICD-10-CM | POA: Insufficient documentation

## 2019-10-31 DIAGNOSIS — Z823 Family history of stroke: Secondary | ICD-10-CM | POA: Insufficient documentation

## 2019-10-31 DIAGNOSIS — Z833 Family history of diabetes mellitus: Secondary | ICD-10-CM | POA: Insufficient documentation

## 2019-10-31 DIAGNOSIS — K219 Gastro-esophageal reflux disease without esophagitis: Secondary | ICD-10-CM | POA: Insufficient documentation

## 2019-10-31 DIAGNOSIS — Z955 Presence of coronary angioplasty implant and graft: Secondary | ICD-10-CM | POA: Insufficient documentation

## 2019-10-31 DIAGNOSIS — E785 Hyperlipidemia, unspecified: Secondary | ICD-10-CM | POA: Insufficient documentation

## 2019-10-31 DIAGNOSIS — E1140 Type 2 diabetes mellitus with diabetic neuropathy, unspecified: Secondary | ICD-10-CM | POA: Insufficient documentation

## 2019-10-31 DIAGNOSIS — R110 Nausea: Secondary | ICD-10-CM | POA: Insufficient documentation

## 2019-10-31 DIAGNOSIS — R519 Headache, unspecified: Secondary | ICD-10-CM | POA: Insufficient documentation

## 2019-10-31 DIAGNOSIS — Z885 Allergy status to narcotic agent status: Secondary | ICD-10-CM | POA: Insufficient documentation

## 2019-10-31 DIAGNOSIS — Z881 Allergy status to other antibiotic agents status: Secondary | ICD-10-CM | POA: Insufficient documentation

## 2019-10-31 DIAGNOSIS — B351 Tinea unguium: Secondary | ICD-10-CM

## 2019-10-31 DIAGNOSIS — E1142 Type 2 diabetes mellitus with diabetic polyneuropathy: Secondary | ICD-10-CM

## 2019-10-31 DIAGNOSIS — Z79899 Other long term (current) drug therapy: Secondary | ICD-10-CM | POA: Insufficient documentation

## 2019-10-31 DIAGNOSIS — G4730 Sleep apnea, unspecified: Secondary | ICD-10-CM | POA: Insufficient documentation

## 2019-10-31 DIAGNOSIS — Z7982 Long term (current) use of aspirin: Secondary | ICD-10-CM | POA: Insufficient documentation

## 2019-10-31 DIAGNOSIS — R0789 Other chest pain: Secondary | ICD-10-CM

## 2019-10-31 DIAGNOSIS — Z8249 Family history of ischemic heart disease and other diseases of the circulatory system: Secondary | ICD-10-CM | POA: Insufficient documentation

## 2019-10-31 DIAGNOSIS — M21372 Foot drop, left foot: Secondary | ICD-10-CM

## 2019-10-31 LAB — COMPREHENSIVE METABOLIC PANEL
ALT: 34 U/L (ref 0–55)
AST (SGOT): 32 U/L (ref 10–42)
Albumin/Globulin Ratio: 1.21 Ratio (ref 0.80–2.00)
Albumin: 3.5 gm/dL (ref 3.5–5.0)
Alkaline Phosphatase: 63 U/L (ref 40–145)
Anion Gap: 12.9 mMol/L (ref 7.0–18.0)
BUN / Creatinine Ratio: 12.3 Ratio (ref 10.0–30.0)
BUN: 14 mg/dL (ref 7–22)
Bilirubin, Total: 3.4 mg/dL — ABNORMAL HIGH (ref 0.1–1.2)
CO2: 30 mMol/L (ref 20–30)
Calcium: 9.2 mg/dL (ref 8.5–10.5)
Chloride: 104 mMol/L (ref 98–110)
Creatinine: 1.14 mg/dL (ref 0.80–1.30)
EGFR: 68 mL/min/{1.73_m2} (ref 60–150)
Globulin: 2.9 gm/dL (ref 2.0–4.0)
Glucose: 178 mg/dL — ABNORMAL HIGH (ref 71–99)
Osmolality Calculated: 290 mOsm/kg (ref 275–300)
Potassium: 3.9 mMol/L (ref 3.5–5.3)
Protein, Total: 6.4 gm/dL (ref 6.0–8.3)
Sodium: 143 mMol/L (ref 136–147)

## 2019-10-31 LAB — CBC AND DIFFERENTIAL
Basophils %: 1.3 % (ref 0.0–3.0)
Basophils Absolute: 0.1 10*3/uL (ref 0.0–0.3)
Eosinophils %: 3.5 % (ref 0.0–7.0)
Eosinophils Absolute: 0.2 10*3/uL (ref 0.0–0.8)
Hematocrit: 41.5 % (ref 39.0–52.5)
Hemoglobin: 14.5 gm/dL (ref 13.0–17.5)
Lymphocytes Absolute: 2.5 10*3/uL (ref 0.6–5.1)
Lymphocytes: 36.6 % (ref 15.0–46.0)
MCH: 30 pg (ref 28–35)
MCHC: 35 gm/dL (ref 32–36)
MCV: 86 fL (ref 80–100)
MPV: 7.5 fL (ref 6.0–10.0)
Monocytes Absolute: 0.5 10*3/uL (ref 0.1–1.7)
Monocytes: 7.5 % (ref 3.0–15.0)
Neutrophils %: 51.1 % (ref 42.0–78.0)
Neutrophils Absolute: 3.4 10*3/uL (ref 1.7–8.6)
PLT CT: 158 10*3/uL (ref 130–440)
RBC: 4.83 10*6/uL (ref 4.00–5.70)
RDW: 13.1 % (ref 11.0–14.0)
WBC: 6.7 10*3/uL (ref 4.0–11.0)

## 2019-10-31 LAB — TROPONIN I: Troponin I: 0.01 ng/mL (ref 0.00–0.02)

## 2019-10-31 LAB — B-TYPE NATRIURETIC PEPTIDE: B-Natriuretic Peptide: 12.4 pg/mL (ref 0.0–100.0)

## 2019-10-31 LAB — D-DIMER, QUANTITATIVE: D-Dimer: <0.19 mg/L FEU — ABNORMAL LOW (ref 0.19–0.52)

## 2019-10-31 MED ORDER — METOPROLOL TARTRATE 50 MG PO TABS
100.0000 mg | ORAL_TABLET | Freq: Two times a day (BID) | ORAL | Status: DC
Start: 2019-11-01 — End: 2019-11-01
  Administered 2019-11-01: 14:00:00 100 mg via ORAL
  Filled 2019-10-31: qty 2

## 2019-10-31 MED ORDER — INSULIN LISPRO (1 UNIT DIAL) 100 UNIT/ML SC SOPN
1.00 [IU] | PEN_INJECTOR | Freq: Every day | SUBCUTANEOUS | Status: DC | PRN
Start: 2019-10-31 — End: 2019-10-31

## 2019-10-31 MED ORDER — ASPIRIN 81 MG PO CHEW
CHEWABLE_TABLET | ORAL | Status: AC
Start: 2019-10-31 — End: ?
  Filled 2019-10-31: qty 4

## 2019-10-31 MED ORDER — VH BASAL INSULIN FROM PATIENT OWN INSULIN PUMP
1.00 [IU] | Status: DC
Start: 2019-10-31 — End: 2019-11-01
  Filled 2019-10-31: qty 100

## 2019-10-31 MED ORDER — INSULIN LISPRO (1 UNIT DIAL) 100 UNIT/ML SC SOPN
1.00 [IU] | PEN_INJECTOR | Freq: Every evening | SUBCUTANEOUS | Status: DC
Start: 2019-10-31 — End: 2019-10-31

## 2019-10-31 MED ORDER — ONDANSETRON HCL 4 MG/2ML IJ SOLN
4.00 mg | Freq: Four times a day (QID) | INTRAMUSCULAR | Status: DC | PRN
Start: 2019-10-31 — End: 2019-11-01

## 2019-10-31 MED ORDER — HYDROCHLOROTHIAZIDE 12.5 MG PO TABS
12.5000 mg | ORAL_TABLET | Freq: Every day | ORAL | Status: DC
Start: 2019-11-01 — End: 2019-11-01
  Administered 2019-11-01: 14:00:00 12.5 mg via ORAL
  Filled 2019-10-31: qty 1

## 2019-10-31 MED ORDER — DEXTROSE 10 % IV BOLUS
125.00 mL | INTRAVENOUS | Status: DC | PRN
Start: 2019-10-31 — End: 2019-10-31

## 2019-10-31 MED ORDER — VH BOLUS INSULIN FROM PATIENT OWN INSULIN PUMP
1.00 [IU] | Status: DC | PRN
Start: 2019-10-31 — End: 2019-11-01
  Filled 2019-10-31: qty 100

## 2019-10-31 MED ORDER — ACETAMINOPHEN 650 MG RE SUPP
650.00 mg | RECTAL | Status: DC | PRN
Start: 2019-10-31 — End: 2019-11-01

## 2019-10-31 MED ORDER — ONDANSETRON 4 MG PO TBDP
4.00 mg | ORAL_TABLET | Freq: Four times a day (QID) | ORAL | Status: DC | PRN
Start: 2019-10-31 — End: 2019-11-01

## 2019-10-31 MED ORDER — DEXTROSE 10 % IV BOLUS
125.00 mL | INTRAVENOUS | Status: DC | PRN
Start: 2019-10-31 — End: 2019-11-01

## 2019-10-31 MED ORDER — ACETAMINOPHEN 160 MG/5ML PO SOLN
650.00 mg | ORAL | Status: DC | PRN
Start: 2019-10-31 — End: 2019-11-01

## 2019-10-31 MED ORDER — GLUCAGON 1 MG IJ SOLR (WRAP)
1.00 mg | INTRAMUSCULAR | Status: DC | PRN
Start: 2019-10-31 — End: 2019-10-31

## 2019-10-31 MED ORDER — METFORMIN HCL 500 MG PO TABS
500.0000 mg | ORAL_TABLET | Freq: Every morning | ORAL | Status: DC
Start: 2019-11-01 — End: 2019-11-01
  Administered 2019-11-01: 17:00:00 500 mg via ORAL
  Filled 2019-10-31: qty 1

## 2019-10-31 MED ORDER — ASPIRIN 81 MG PO CHEW
324.00 mg | CHEWABLE_TABLET | Freq: Once | ORAL | Status: AC
Start: 2019-10-31 — End: 2019-10-31
  Administered 2019-10-31: 18:00:00 324 mg via ORAL

## 2019-10-31 MED ORDER — PREGABALIN 25 MG PO CAPS
50.00 mg | ORAL_CAPSULE | Freq: Two times a day (BID) | ORAL | Status: DC
Start: 2019-10-31 — End: 2019-11-01
  Administered 2019-10-31 – 2019-11-01 (×2): 50 mg via ORAL
  Filled 2019-10-31 (×2): qty 2

## 2019-10-31 MED ORDER — INSULIN LISPRO (1 UNIT DIAL) 100 UNIT/ML SC SOPN
2.00 [IU] | PEN_INJECTOR | Freq: Three times a day (TID) | SUBCUTANEOUS | Status: DC
Start: 2019-11-01 — End: 2019-10-31

## 2019-10-31 MED ORDER — VH BOLUS INSULIN FROM PATIENT OWN INSULIN PUMP
1.00 [IU] | Freq: Before meals | Status: DC
Start: 2019-10-31 — End: 2019-11-01
  Filled 2019-10-31 (×5): qty 100

## 2019-10-31 MED ORDER — NITROGLYCERIN 0.4 MG SL SUBL
0.40 mg | SUBLINGUAL_TABLET | SUBLINGUAL | Status: DC | PRN
Start: 2019-10-31 — End: 2019-11-01

## 2019-10-31 MED ORDER — HYDROXYZINE PAMOATE 50 MG PO CAPS
50.00 mg | ORAL_CAPSULE | Freq: Every evening | ORAL | Status: DC
Start: 2019-10-31 — End: 2019-11-01
  Administered 2019-10-31: 22:00:00 50 mg via ORAL
  Filled 2019-10-31 (×2): qty 1

## 2019-10-31 MED ORDER — ASPIRIN 81 MG PO CHEW
81.00 mg | CHEWABLE_TABLET | Freq: Every day | ORAL | Status: DC
Start: 2019-11-01 — End: 2019-11-01
  Administered 2019-11-01: 17:00:00 81 mg via ORAL
  Filled 2019-10-31: qty 1

## 2019-10-31 MED ORDER — FUROSEMIDE 40 MG PO TABS
20.0000 mg | ORAL_TABLET | Freq: Every morning | ORAL | Status: DC
Start: 2019-11-01 — End: 2019-11-01
  Administered 2019-11-01: 14:00:00 20 mg via ORAL
  Filled 2019-10-31: qty 1

## 2019-10-31 MED ORDER — DULOXETINE HCL 30 MG PO CPEP
60.00 mg | ORAL_CAPSULE | Freq: Every morning | ORAL | Status: DC
Start: 2019-11-01 — End: 2019-11-01
  Administered 2019-11-01: 14:00:00 60 mg via ORAL
  Filled 2019-10-31: qty 2

## 2019-10-31 MED ORDER — SODIUM CHLORIDE (PF) 0.9 % IJ SOLN
3.00 mL | Freq: Three times a day (TID) | INTRAMUSCULAR | Status: DC
Start: 2019-10-31 — End: 2019-11-01
  Administered 2019-10-31 – 2019-11-01 (×2): 3 mL via INTRAVENOUS

## 2019-10-31 MED ORDER — ATORVASTATIN CALCIUM 40 MG PO TABS
80.0000 mg | ORAL_TABLET | Freq: Every evening | ORAL | Status: DC
Start: 2019-10-31 — End: 2019-11-01
  Administered 2019-10-31: 21:00:00 80 mg via ORAL
  Filled 2019-10-31: qty 2

## 2019-10-31 MED ORDER — CLOPIDOGREL BISULFATE 75 MG PO TABS
75.0000 mg | ORAL_TABLET | Freq: Every day | ORAL | Status: DC
Start: 2019-11-01 — End: 2019-11-01
  Administered 2019-11-01: 17:00:00 75 mg via ORAL
  Filled 2019-10-31: qty 1

## 2019-10-31 MED ORDER — NALOXONE HCL 0.4 MG/ML IJ SOLN (WRAP)
0.40 mg | INTRAMUSCULAR | Status: DC | PRN
Start: 2019-10-31 — End: 2019-11-01

## 2019-10-31 MED ORDER — GLUCAGON 1 MG IJ SOLR (WRAP)
1.00 mg | INTRAMUSCULAR | Status: DC | PRN
Start: 2019-10-31 — End: 2019-11-01

## 2019-10-31 MED ORDER — VH PATIENT OWN INSULIN PUMP
Status: DC
Start: 2019-10-31 — End: 2019-11-01

## 2019-10-31 MED ORDER — ACETAMINOPHEN 325 MG PO TABS
650.0000 mg | ORAL_TABLET | ORAL | Status: DC | PRN
Start: 2019-10-31 — End: 2019-11-01
  Administered 2019-10-31: 650 mg via ORAL
  Filled 2019-10-31: qty 2

## 2019-10-31 NOTE — ED Provider Notes (Signed)
[facility]  EMERGENCY DEPARTMENT  History and Physical Exam     Patient Name: Michael [name], Michael [name]  Encounter Date:  10/31/2019  Supervising Physician: Magdalene Molly, MD   Treating Provider: Cathi Roan NP  Room:  N1/N1-A  Patient DOB:  [birth year]  Age: 64 y.o. male  MRN:  [id number]  PCP: Clifton James, PA      Diagnosis/Disposition:  MDM:     Final Impression  1. Chest pain        Disposition  ED Disposition     ED Disposition Condition Date/Time Comment    Admit  Thu Oct 31, 2019  6:02 PM Service: Observation Unit [[id number]]            Follow up  No follow-up provider specified.    Prescriptions  New Prescriptions    No medications on file     ED Summary:    The patient presents with chest pain of unclear etiology.  Initial cardiac markers and EKG are nondiagnostic. Based upon the presentation the patient appears to be at low risk for PE, aortic dissection, pneumothorax, pneumonia or other serious conditions. The plan is to admit this patient for cardiac evaluation, observation and further testing.  The admission plan was discussed with the patient and/or family and they will comply.  Results of lab/radiology/EKG tests were discussed with the patient and/or family. All questions were answered and concerns addressed and appropriate consultation was obtained.    Spoke with Shanda Bumps in observation and their team is planning to assess patient for admission and further work-up.      Heart Score:  Used for ACS Risk Stratification    Low Score (0-3 points) = Risk of MACE of 0.9-1.7%  Moderate Score (4-6 point) = Risk of MACE of 12-16.66%  High Score (7-10 points) = Risk of MACE of 50-65%    History: Highly suspicious (+2)  ECG: Non-specific repolarization disturbance (+1)  Age: 42-65 (+1)  Risk: >= 3 risk factors or history of atherosclerotic disease (+2)  Troponin: <= normal limit (0)    Score: 6    At the time of disposition, after discussion with the pt of all findings, considered differential and  their implications, as well as signs of worsening, and their potential implications, patient/family verbalizes understanding and is amenable to treatment plan, and has no concerns at this time, all questions answered.    Discussed case presentation, workup, and plan with my attending, and they are in agreement with the aforementioned.     The patient's past medical records, including those in Care Everywhere when necessary, were reviewed by me    The results of diagnostic studies have been reviewed by myself. Available past medical, family, social, and surgical histories have been reviewed by myself. The clinical impression and plan have been discussed with the patient and/or the patient's family. All questions have been answered.      History of Presenting Illness:     Nurse Triage: onset chest pain while in [employer] shopping, heavy radiaitng into back and into neck, pain did not relieve    Chief complaint: Chest Pain        HPI/ROS given by: patient, unless otherwise stated    Michael [name] is a 64 y.o. male with a PMHx of CAD, T2DM and HTN presenting with chest pain that began about an hour ago while shopping. He states he was not doing anything strenuous, his chest pain began, radiating to his back and neck and asked  someone to call 911. He describes his chest pain as an "electric shock." At the time he was having trouble catching his breath. He further reports he had chest pain briefly yesterday than subsided quickly with resting. He reports feeling sweaty and hot at the time, but was outside and is unsure if it was attributed to the weather. He also noticed bilateral leg swelling within the last several days and it was thought to be cellulitis, but then his PCP gave him Lasix which he picked up today and started this morning. He also feels "dizzy" and nauseas since this episode started. Reports previous cholecystectomy in May and endoscopy for "esophageal stretching" in June.       Denies calf pain or  swelling, recent surgery, immobilization, or hospitalization in past 30 days, recent Tanaisha Pittman distance travel, hormonal therapy, prior DVT or PE, malignancy treatment or diagnosis in past 6 months, hemoptysis.             Review of Systems:  Physical Exam:     Review of Systems   Constitutional: Positive for diaphoresis. Negative for chills and fever.   HENT: Positive for congestion and postnasal drip. Negative for sore throat.    Eyes: Negative.    Respiratory: Positive for cough and shortness of breath.    Cardiovascular: Positive for chest pain and leg swelling.   Gastrointestinal: Positive for abdominal distention and nausea. Negative for diarrhea and vomiting.        (-) Bowel incontinence   Genitourinary: Negative for dysuria.   Musculoskeletal: Positive for back pain and neck pain. Negative for neck stiffness.        (-) trauma   Skin: Negative for rash.   Neurological: Positive for dizziness. Negative for speech difficulty, weakness, numbness and headaches.   Psychiatric/Behavioral: Negative.      Blood pressure 115/75, pulse 84, temperature 98.3 F (36.8 C), temperature source Temporal, resp. rate 20, height 1.727 m, weight 103.9 kg, SpO2 92 %.     Physical Exam  Constitutional:       General: He is not in acute distress.     Appearance: He is well-developed.   HENT:      Head: Normocephalic and atraumatic.   Eyes:      General:         Right eye: No discharge.      Pupils: Pupils are equal, round, and reactive to light.   Cardiovascular:      Rate and Rhythm: Normal rate and regular rhythm.      Heart sounds: Normal heart sounds. No murmur heard.   No friction rub. No gallop.    Pulmonary:      Effort: Pulmonary effort is normal. No accessory muscle usage.      Breath sounds: Normal breath sounds.   Abdominal:      Palpations: Abdomen is soft. There is no mass.      Tenderness: There is no abdominal tenderness.   Musculoskeletal:         General: No deformity. Normal range of motion.      Cervical back:  Normal range of motion.      Right lower leg: Edema present.      Left lower leg: Edema present.   Skin:     General: Skin is warm and dry.      Findings: No rash.   Neurological:      Mental Status: He is alert and oriented to person, place, and time.  Past History:     Medical: Pt has a past medical history of CAD (coronary artery disease), Cellulitis of finger of left hand (10/22/14), Diabetes mellitus, Gastroesophageal reflux disease, Hernia, Hyperlipidemia, Hypertension, Myocardial infarction, Neuropathy, Presence of stent in coronary artery, Sleep apnea, TIA (transient ischemic attack), and Type 2 diabetes mellitus, controlled.    Surgical: Pt  has a past surgical history that includes Coronary angioplasty with stent; Cervical fusion; Lumbar laminectomy; Coronary angioplasty with stent (12/13/12); CIRCUMCISION, > 13 YEARS, ADULT (N/A, 09/08/2014); AMPUTATION, FINGER (Left, 02/26/2015); Carpal tunnel release (Left); RELEASE, CUBITAL TUNNEL (Left, 05/30/2017); EGD, COLONOSCOPY (N/A, 12/19/2017); and EGD (N/A, 09/18/2019).    Family: The family history includes Diabetes in his mother and sister; Heart attack in his father and mother; Heart disease in his father and mother; Stroke in his father and mother.    Social: Pt reports that he has never smoked. He has never used smokeless tobacco. He reports current alcohol use. He reports that he does not use drugs.       Diagnostic Results:     RADIOLOGIC STUDIES    All images have been personally viewed by me    XR Chest AP Portable    Result Date: 10/31/2019  Chronic elevation of the right diaphragm. No acute cardiopulmonary abnormalities. ReadingStation:[id number]      LAB STUDIES    All lab value have been personally reviewed by me    Results     Procedure Component Value Units Date/Time    B-type Natriuretic Peptide [[id number]] Collected: 10/31/19 1500    Specimen: Plasma Updated: 10/31/19 1757     B-Natriuretic Peptide 12.4 pg/mL     D-dimer, quantitative  [[id number]]  (Abnormal) Collected: 10/31/19 1500    Specimen: Blood Updated: 10/31/19 1655     D-Dimer <0.19 mg/L FEU     Troponin I [[id number]] Collected: 10/31/19 1500    Specimen: Plasma Updated: 10/31/19 1624     Troponin I 0.01 ng/mL     Comprehensive metabolic panel [[id number]]  (Abnormal) Collected: 10/31/19 1500    Specimen: Plasma Updated: 10/31/19 1618     Sodium 143 mMol/L      Potassium 3.9 mMol/L      Chloride 104 mMol/L      CO2 30 mMol/L      Calcium 9.2 mg/dL      Glucose 629 mg/dL      Creatinine 5.28 mg/dL      BUN 14 mg/dL      Protein, Total 6.4 gm/dL      Albumin 3.5 gm/dL      Alkaline Phosphatase 63 U/L      ALT 34 U/L      AST (SGOT) 32 U/L      Bilirubin, Total 3.4 mg/dL      Albumin/Globulin Ratio 1.21 Ratio      Anion Gap 12.9 mMol/L      BUN / Creatinine Ratio 12.3 Ratio      EGFR 68 mL/min/1.56m2      Osmolality Calculated 290 mOsm/kg      Globulin 2.9 gm/dL     CBC and differential [[id number]] Collected: 10/31/19 1500    Specimen: Blood Updated: 10/31/19 1615     WBC 6.7 K/cmm      RBC 4.83 M/cmm      Hemoglobin 14.5 gm/dL      Hematocrit 27.2 %      MCV 86 fL      MCH 30 pg      MCHC 35 gm/dL  RDW 13.1 %      PLT CT 158 K/cmm      MPV 7.5 fL      Neutrophils % 51.1 %      Lymphocytes 36.6 %      Monocytes 7.5 %      Eosinophils % 3.5 %      Basophils % 1.3 %      Neutrophils Absolute 3.4 K/cmm      Lymphocytes Absolute 2.5 K/cmm      Monocytes Absolute 0.5 K/cmm      Eosinophils Absolute 0.2 K/cmm      Basophils Absolute 0.1 K/cmm               EKG:    Reviewed at the time of assessment, or that it was made available to me.   EKG:   I have interpreted the EKG at the time it was made available to me and my reading is consistent with as follows:    Last EKG Result     None        EKG read not crossing in Epic.  Some flattening in some of the precordial leads although this is consistent with prior no acute ST elevation sinus rate and rhythm.    All EKG reviewed by an attending  immediately after completion per ED policy.     PROCEDURES     1.      ORDERS PLACED THIS VISIT     Orders  Orders Placed This Encounter   Procedures   . XR Chest AP Portable   . CBC and differential   . Comprehensive metabolic panel   . Troponin I   . D-dimer, quantitative   . B-type Natriuretic Peptide       Medications  Medications   aspirin chewable tablet 324 mg (324 mg Oral Given 10/31/19 1814)              Allergies & Medications:     Pt is allergic to gabapentin, glucophage [metformin hydrochloride], keflex [cephalexin], morphine, and victoza [liraglutide].    Current/Home Medications    ACETAMINOPHEN (TYLENOL) 500 MG TABLET    Take 1,000 mg by mouth every 4 (four) hours as needed for Pain    ATORVASTATIN (LIPITOR) 80 MG TABLET    Take 80 mg by mouth nightly    CALCIUM-MAGNESIUM-ZINC PO    Take 3 tablets by mouth every morning       CETIRIZINE (ZYRTEC) 10 MG TABLET    Take 10 mg by mouth every morning       CHOLESTYRAMINE (QUESTRAN) 4 G PACKET    Take 1 packet by mouth daily as needed (diarrhea)       CLOPIDOGREL (PLAVIX) 75 MG TABLET    Take 1 tablet (75 mg total) by mouth daily.    DAPAGLIFLOZIN PROPANEDIOL (FARXIGA) 10 MG TAB    Take 10 mg by mouth every morning    DULOXETINE (CYMBALTA) 60 MG CAPSULE    Take 60 mg by mouth every morning    FLUTICASONE (FLONASE) 50 MCG/ACT NASAL SPRAY    2 sprays by Nasal route daily as needed    FUROSEMIDE (LASIX) 20 MG TABLET    Take 20 mg by mouth every morning       HYDROCHLOROTHIAZIDE (MICROZIDE) 12.5 MG CAPSULE    Take 12.5 mg by mouth every morning    HYDROXYZINE (VISTARIL) 50 MG CAPSULE    Take 1 capsule (50 mg total) by mouth nightly.  ICOSAPENT ETHYL (VASCEPA) 1 G CAPSULE    Take 2 g by mouth 2 (two) times daily with meals    INSULIN ASPART (INSULIN, PATIENT OWN PUMP,: TOTAL DAILY BASAL UNITS)    Inject 2.5 Units into the skin every 1 hour Novolog    LIDOCAINE (LIDODERM) 5 %    Place 2 patches onto the skin daily as needed (apply tp bottoms of feet) Remove &  Discard patch within 12 hours or as directed by MD    METFORMIN (GLUCOPHAGE-XR) 500 MG 24 HR TABLET    Take 500 mg by mouth every morning    METOPROLOL TARTRATE (LOPRESSOR) 100 MG TABLET    Take 100 mg by mouth 2 (two) times daily    MULTIPLE VITAMIN (ONE-A-DAY MENS) TAB    Take 1 tablet by mouth every morning.    NITROGLYCERIN (NITROSTAT) 0.4 MG SL TABLET    Place 1 tablet (0.4 mg total) under the tongue every 5 (five) minutes as needed for Chest pain    PREGABALIN (LYRICA) 50 MG CAPSULE    Take 50 mg by mouth 2 (two) times daily       VITAMIN B-12 (CYANOCOBALAMIN) 1000 MCG TABLET    Take 1,000 mcg by mouth every morning       VITAMIN D, ERGOCALCIFEROL, (DRISDOL) 50000 UNIT CAP    Take 50,000 Units by mouth Once each week on Tuesday morning           ATTESTATIONS     Cathi Roan, NP    The results of diagnostic studies have been reviewed by myself. The above past medical, family, social, and surgical histories have been reviewed by myself. The clinical impression and plan have been discussed with the patient and/or the patient's family. All questions have been answered.    Note:  This chart was generated by an EMR and may contain errors, including typographical, or omissions not intended by the user. This chart was generated by the Epic EMR system/speech recognition and may contain inherent errors or omissions not intended by the user. Grammatical errors, random word insertions, deletions, pronoun errors and incomplete sentences are occasional consequences of this technology due to software limitations. Not all errors are caught or corrected. If there are questions or concerns about the content of this note or information contained within the body of this dictation they should be addressed directly with the author for clarification          Cleotis Nipper, NP  10/31/19 Webb Silversmith, NP  10/31/19 1925       Magdalene Molly, MD  11/02/19 1815

## 2019-10-31 NOTE — Plan of Care (Signed)
Problem: Moderate/High Fall Risk Score >5  Goal: Patient will remain free of falls  Outcome: Progressing  Flowsheets (Taken 10/31/2019 2012)  VH High Risk (Greater than 13):   ALL REQUIRED LOW INTERVENTIONS   ALL REQUIRED MODERATE INTERVENTIONS   Keep door open for better visibility   Use assistive devices     Problem: Chest Pain  Goal: Vital signs and cardiac rhythm stable  Outcome: Progressing  Flowsheets (Taken 10/31/2019 2205)  Vital signs and cardiac rhythm stable:   Monitor /assess vital signs/cardiac rhythms   Monitor labs   Assess the need for oxygen therapy and administer as ordered  Goal: Cardiac pain management  Outcome: Progressing  Flowsheets (Taken 10/31/2019 2205)  Cardiac pain management:   Assess/report chest pain/or related discomfort to LIP immediately   Instruct patient to report any change in pain status   Assess pain/or related discomfort on admission, during daily assessment, before and after any intervention   Include patient and patient care companion in decisions related to pain management  Goal: Patient/Patient Care Companion demonstrates understanding of disease process, treatment plan, medications, and discharge plan  Outcome: Progressing  Flowsheets (Taken 10/31/2019 2205)  Patient/Patient Care Companion demonstrates understanding of disease process, treatment plan, medications and discharge plan:   Educate patient to immediately report any chest pain/equivalent to RN   Reinforce regarding cardiac diet and any fluid parameters   Nutrition consult as needed   Assist patient/patient care companion to identify measures for cardiac risk factor management   Consult/collaborate with Cardiac Rehabilitation   Assess need for smoking cessation and substance abuse counseling and refer as needed   Educate patient/patient care companion regarding identified learning needs   Plan for discharge   Provide appropriate resources and referrals

## 2019-10-31 NOTE — EDIE (Signed)
COLLECTIVE?NOTIFICATION?10/31/2019 14:54?Darlyn Read?MRN: [id number]    Criteria Met      High Utilization (6+ED/6 Months)    Security and Safety  No recent Security Events currently on file    ED Care Guidelines  There are currently no ED Care Guidelines for this patient. Please check your facility's medical records system.        Prescription Monitoring Program  000??- Narcotic Use Score  000??- Sedative Use Score  000??- Stimulant Use Score  000??- Overdose Risk Score  - All Scores range from 000-999 with 75% of the population scoring < 200 and on 1% scoring above 650  - The last digit of the narcotic, sedative, and stimulant score indicates the number of active prescriptions of that type  - Higher Use scores correlate with increased prescribers, pharmacies, mg equiv, and overlapping prescriptions  - Higher Overdose Risk Scores correlate with increased risk of unintentional overdose death   Concerning or unexpectedly high scores should prompt a review of the PMP record; this does not constitute checking PMP for prescribing purposes.      E.D. Visit Count (12 mo.)  Facility Visits   [facility] 1   [facility] 1   [facility] 6   Total 8   Note: Visits indicate total known visits.     Recent Emergency Department Visit Summary  Date Facility [facility] Type Diagnoses or Chief Complaint   Oct 31, 2019 [facility] [place] [place] Emergency      Chest Pain      Oct 29, 2019 [facility] Emergency      0. LEG SWELLING      Sep 06, 2019 [facility] Emergency      0. CHEST PAIN      Jul 15, 2019 [facility] [place] Emergency      0. Shortness of breath      0. SOB NAUSEA EMS      1. Unspecified abdominal pain      2. Atherosclerotic heart disease of native coronary artery without angina pectoris      2. Presence of coronary angioplasty implant and graft      2. Other long term (current) drug therapy      2. Type 2 diabetes  mellitus with hyperglycemia      2. Long term (current) use of oral hypoglycemic drugs      2. Old myocardial infarction      2. Personal history of nicotine dependence      Jul 13, 2019 [facility] [place]. [facility] Emergency      0. HIGH SUGAR      0. Hyperglycemia, unspecified      1. Type 2 diabetes mellitus with hyperglycemia      2. Old myocardial infarction      2. Coronary angioplasty status      2. Personal history of Methicillin resistant Staphylococcus aureus infection      2. Polyneuropathy, unspecified      2. Atherosclerotic heart disease of native coronary artery without angina pectoris      2. Other chronic pain      2. Osteomyelitis, unspecified      Jun 12, 2019 [facility] Emergency      0. Unspecified abdominal pain      1. Diarrhea, unspecified      2. Pure hypercholesterolemia, unspecified      2.  Long term (current) use of oral hypoglycemic drugs      2. Presence of coronary angioplasty implant and graft      2. Type 2 diabetes mellitus without complications      2. Nausea      2. CONTACT WITH AND (SUSPECTED) EXPOSURE TO COVID-19      2. Hypertensive heart disease without heart failure      2. Old myocardial infarction      May 02, 2019 [facility] Emergency      0. Pain in unspecified foot      1. Cellulitis of left lower limb      2. Personal history of Methicillin resistant Staphylococcus aureus infection      2. Puncture wound with foreign body, left foot, initial encounter      2. Hyperlipidemia, unspecified      2. Atherosclerotic heart disease of native coronary artery without angina pectoris      2. Polyneuropathy, unspecified      2. Osteomyelitis, unspecified      2. Chronic pain syndrome      2. Gastro-esophageal reflux disease without esophagitis      Dec 26, 2018 [facility] [place] [place] Emergency      CHEST PAIN/EMS      CHEST PAIN ADULT      Non-ST elevation (NSTEMI) myocardial infarction      Chest pain, unspecified          Recent Inpatient  Visit Summary  Date Facility [facility] Type Diagnoses or Chief Complaint   Jul 16, 2019 [facility] [place]. [place] Inpatient      0. NAUSEA VOMITING UNCONTROLLED DIABETES      0. Nausea with vomiting, unspecified      1. Other cholelithiasis without obstruction      2. Other chronic pain      2. Gastro-esophageal reflux disease without esophagitis      2. Long term (current) use of inhaled steroids      2. Type 2 diabetes mellitus without complications      2. Presence of aortocoronary bypass graft      2. Presence of insulin pump (external) (internal)      2. Hyperlipidemia, unspecified      Dec 26, 2018 [facility] [place] [place] [facility]      NSTEMI      Non-ST elevation (NSTEMI) myocardial infarction      Chest pain, unspecified      CHEST PAIN ADULT      1. Myocardial infarction type 2      3. Dehydration      4. Noninfective gastroenteritis and colitis, unspecified      5. Essential (primary) hypertension      6. Old myocardial infarction      7. Atherosclerotic heart disease of native coronary artery without angina pectoris          Care Team  Provider Specialty Phone Fax Service Dates   [place], [place] , D.O. Internal Medicine [phone] Current    [facility] [place] Case Manager/Care Coordinator   Current    [facility] Case Manager/Care Coordinator   Current    Clifton James [facility]   Current    [facility] Other   Current    [facility], [facility] Case or Care Manager [phone]  Current  Collective Portal  This patient has registered at the [facility] Emergency Department   For more information visit: [email or web address]     PLEASE NOTE:     1.   Any care recommendations and other clinical information are provided as guidelines or for historical purposes only, and providers should exercise their own clinical judgment when  providing care.    2.   You may only use this information for purposes of treatment, payment or health care operations activities, and subject to the limitations of applicable Collective Policies.    3.   You should consult directly with the organization that provided a care guideline or other clinical history with any questions about additional information or accuracy or completeness of information provided.    ? 2021 [place], [employer] - [email or web address]

## 2019-10-31 NOTE — H&P (Signed)
[facility]  OBSERVATION UNIT  HISTORY AND PHYSICAL       Patient: Michael [name]  Admission Date: 10/31/2019    DOB: Sep 11, 1955  Age: 64 y.o.    MRN: [id number]  Sex: male    PCP: Clifton James, PA  Attending: Magdalene Molly, MD         HISTORY OF PRESENT ILLNESS     Michael [name] is a 64 y.o. male who presented with chest pain.  Patient states that earlier today he felt exhausted and short of breath.  He had midsternal chest pain which he describes as a pressure and a sharp pain.  The pain radiated to his back and his neck.  He had associated shortness of breath, diaphoresis nausea and dizziness.  He also felt bloated.  2 days ago he was seen at [facility] for complaints of lower extremity pain and swelling.  He was told that he might have cellulitis however no antibiotic was prescribed.  He saw his PCP the following day and was started on Lasix.  He took his first dose today.  He denies recent fever chills cough sore throat abdominal pain vomiting or diarrhea.  He states he had his gallbladder out in May and he had an endoscopy in June to have his esophagus stretched.  He has not received the Covid vaccine.    PAST MEDICAL HISTORY     Code Status: Prior    Past Medical History:   Diagnosis Date    CAD (coronary artery disease)     Cellulitis of finger of left hand 10/22/14    Diabetes mellitus     Gastroesophageal reflux disease     Hernia     Hyperlipidemia     Hypertension     Myocardial infarction     Neuropathy     diabetic    Presence of stent in coronary artery     4 STENTS    Sleep apnea     TIA (transient ischemic attack)     Type 2 diabetes mellitus, controlled     On insulin pump now.        Past Surgical History:   Procedure Laterality Date    AMPUTATION, FINGER Left 02/26/2015    Procedure: AMPUTATION, FINGER;  Surgeon: Lujean Rave, MD;  Location: [facility];  Service: Orthopedics;  Laterality: Left;  Amputation left long finger    CARPAL  TUNNEL RELEASE Left     05/30/17    CERVICAL FUSION      CIRCUMCISION, > 13 YEARS, ADULT N/A 09/08/2014    Procedure: CIRCUMCISION, > 13 YEARS, ADULT;  Surgeon: Ellwood Handler, MD;  Location: [facility];  Service: Urology;  Laterality: N/A;  CIRCUMCISION    CORONARY ANGIOPLASTY WITH STENT PLACEMENT      2 stents    CORONARY ANGIOPLASTY WITH STENT PLACEMENT  12/13/12    DES to OM1, 2 stents    EGD N/A 09/18/2019    Procedure: EGD;  Surgeon: Salena Saner, MD;  Location: [facility];  Service: Gastroenterology;  Laterality: N/A;    EGD, COLONOSCOPY N/A 12/19/2017    Procedure: EGD, COLONOSCOPY;  Surgeon: Salena Saner, MD;  Location: [facility];  Service: Gastroenterology;  Laterality: N/A;    LUMBAR LAMINECTOMY      RELEASE, CUBITAL TUNNEL Left 05/30/2017       Current/Home Medications    ACETAMINOPHEN (TYLENOL) 500 MG TABLET    Take 1,000 mg by mouth every  4 (four) hours as needed for Pain    ATORVASTATIN (LIPITOR) 80 MG TABLET    Take 80 mg by mouth nightly    CALCIUM-MAGNESIUM-ZINC PO    Take 3 tablets by mouth every morning       CETIRIZINE (ZYRTEC) 10 MG TABLET    Take 10 mg by mouth every morning       CHOLESTYRAMINE (QUESTRAN) 4 G PACKET    Take 1 packet by mouth daily as needed (diarrhea)       CLOPIDOGREL (PLAVIX) 75 MG TABLET    Take 1 tablet (75 mg total) by mouth daily.    DAPAGLIFLOZIN PROPANEDIOL (FARXIGA) 10 MG TAB    Take 10 mg by mouth every morning    DULOXETINE (CYMBALTA) 60 MG CAPSULE    Take 60 mg by mouth every morning    FLUTICASONE (FLONASE) 50 MCG/ACT NASAL SPRAY    2 sprays by Nasal route daily as needed    FUROSEMIDE (LASIX) 20 MG TABLET    Take 20 mg by mouth every morning       HYDROCHLOROTHIAZIDE (MICROZIDE) 12.5 MG CAPSULE    Take 12.5 mg by mouth every morning    HYDROXYZINE (VISTARIL) 50 MG CAPSULE    Take 1 capsule (50 mg total) by mouth nightly.    ICOSAPENT ETHYL (VASCEPA) 1 G CAPSULE    Take 2 g by mouth 2 (two) times daily with meals    INSULIN  ASPART (INSULIN, PATIENT OWN PUMP,: TOTAL DAILY BASAL UNITS)    Inject 2.5 Units into the skin every 1 hour Novolog    LIDOCAINE (LIDODERM) 5 %    Place 2 patches onto the skin daily as needed (apply tp bottoms of feet) Remove & Discard patch within 12 hours or as directed by MD    METFORMIN (GLUCOPHAGE-XR) 500 MG 24 HR TABLET    Take 500 mg by mouth every morning    METOPROLOL TARTRATE (LOPRESSOR) 100 MG TABLET    Take 100 mg by mouth 2 (two) times daily    MULTIPLE VITAMIN (ONE-A-DAY MENS) TAB    Take 1 tablet by mouth every morning.    NITROGLYCERIN (NITROSTAT) 0.4 MG SL TABLET    Place 1 tablet (0.4 mg total) under the tongue every 5 (five) minutes as needed for Chest pain    PREGABALIN (LYRICA) 50 MG CAPSULE    Take 50 mg by mouth 2 (two) times daily       VITAMIN B-12 (CYANOCOBALAMIN) 1000 MCG TABLET    Take 1,000 mcg by mouth every morning       VITAMIN D, ERGOCALCIFEROL, (DRISDOL) 50000 UNIT CAP    Take 50,000 Units by mouth Once each week on Tuesday morning       Allergies:   Allergies   Allergen Reactions    Gabapentin      "Makes crazy/hyper"    Glucophage [Metformin Hydrochloride] Nausea And Vomiting    Keflex [Cephalexin] Nausea And Vomiting    Morphine      Hallucinations    Victoza [Liraglutide] Nausea And Vomiting       Family History   Problem Relation Age of Onset    Heart disease Mother     Stroke Mother     Diabetes Mother     Heart attack Mother     Heart disease Father     Stroke Father     Heart attack Father     Diabetes Sister        SHx:  reports that he has never smoked. He has never used smokeless tobacco. He reports current alcohol use. He reports that he does not use drugs.    REVIEW OF SYSTEMS     Review of Systems   Constitutional: Positive for diaphoresis and malaise/fatigue. Negative for chills and fever.   HENT: Negative for congestion, sinus pain and sore throat.    Eyes: Negative for blurred vision and double vision.   Respiratory: Negative for cough, sputum  production, wheezing and stridor.    Cardiovascular: Positive for chest pain and leg swelling. Negative for palpitations, orthopnea and PND.   Gastrointestinal: Positive for nausea. Negative for abdominal pain, blood in stool, diarrhea, melena and vomiting.   Genitourinary: Negative for dysuria, flank pain, frequency and urgency.   Musculoskeletal: Negative for joint pain and myalgias.   Skin: Negative.    Neurological: Positive for dizziness. Negative for tingling, focal weakness, loss of consciousness, weakness and headaches.   Endo/Heme/Allergies: Negative.    Psychiatric/Behavioral: Negative.        PHYSICAL EXAM     Vital Signs: Blood pressure 113/64, pulse 84, temperature 98.3 F (36.8 C), temperature source Temporal, resp. rate 20, height 1.727 m (5\' 8" ), weight 103.9 kg (229 lb), SpO2 96 %.    Physical Exam  Vitals reviewed.   Constitutional:       Appearance: He is well-developed.   HENT:      Head: Normocephalic and atraumatic.   Eyes:      Pupils: Pupils are equal, round, and reactive to light.   Neck:      Vascular: No JVD.   Cardiovascular:      Rate and Rhythm: Normal rate and regular rhythm.      Heart sounds: Normal heart sounds. No murmur heard.   No friction rub. No gallop.    Pulmonary:      Effort: Pulmonary effort is normal. No respiratory distress.      Breath sounds: Normal breath sounds. No wheezing or rales.   Chest:      Chest wall: No tenderness.   Abdominal:      General: Bowel sounds are normal. There is no distension.      Palpations: Abdomen is soft.      Tenderness: There is no abdominal tenderness.      Comments: Insulin pump on the right and lower abdomen   Musculoskeletal:         General: No tenderness. Normal range of motion.      Cervical back: Normal range of motion and neck supple.   Skin:     General: Skin is warm and dry.      Coloration: Skin is not pale.      Findings: No erythema or rash.   Neurological:      Mental Status: He is alert and oriented to person, place, and  time.   Psychiatric:         Behavior: Behavior normal.         LABS & IMAGING     Labs:  Results for orders placed or performed during the hospital encounter of 10/31/19   CBC and differential   Result Value Ref Range    WBC 6.7 4.0 - 11.0 K/cmm    RBC 4.83 4.00 - 5.70 M/cmm    Hemoglobin 14.5 13.0 - 17.5 gm/dL    Hematocrit 16.1 09.6 - 52.5 %    MCV 86 80.0 - 100.0 fL    MCH 30 28.0 - 35.0 pg  MCHC 35 32 - 36 gm/dL    RDW 16.1 09.6 - 04.5 %    PLT CT 158 130 - 440 K/cmm    MPV 7.5 6.0 - 10.0 fL    Neutrophils % 51.1 42.0 - 78.0 %    Lymphocytes 36.6 15.0 - 46.0 %    Monocytes 7.5 3.0 - 15.0 %    Eosinophils % 3.5 0.0 - 7.0 %    Basophils % 1.3 0.0 - 3.0 %    Neutrophils Absolute 3.4 1 - 8 K/cmm    Lymphocytes Absolute 2.5 0.6 - 5.1 K/cmm    Monocytes Absolute 0.5 0 - 1 K/cmm    Eosinophils Absolute 0.2 0.0 - 0.8 K/cmm    Basophils Absolute 0.1 0.0 - 0.3 K/cmm   Comprehensive metabolic panel   Result Value Ref Range    Sodium 143 136 - 147 mMol/L    Potassium 3.9 3.5 - 5.3 mMol/L    Chloride 104 98 - 110 mMol/L    CO2 30 20 - 30 mMol/L    Calcium 9.2 8.5 - 10.5 mg/dL    Glucose 409 (H) 71 - 99 mg/dL    Creatinine 8.11 9.14 - 1.30 mg/dL    BUN 14 7 - 22 mg/dL    Protein, Total 6.4 6.0 - 8.3 gm/dL    Albumin 3.5 3.5 - 5.0 gm/dL    Alkaline Phosphatase 63 40 - 145 U/L    ALT 34 0 - 55 U/L    AST (SGOT) 32 10 - 42 U/L    Bilirubin, Total 3.4 (H) 0.1 - 1.2 mg/dL    Albumin/Globulin Ratio 1.21 0.80 - 2.00 Ratio    Anion Gap 12.9 7 - 18 mMol/L    BUN / Creatinine Ratio 12.3 10.0 - 30.0 Ratio    EGFR 68 60 - 150 mL/min/1.38m2    Osmolality Calculated 290 275 - 300 mOsm/kg    Globulin 2.9 2.0 - 4.0 gm/dL   Troponin I   Result Value Ref Range    Troponin I 0.01 0.00 - 0.02 ng/mL   D-dimer, quantitative   Result Value Ref Range    D-Dimer <0.19 (L) 0.19 - 0.52 mg/L FEU   B-type Natriuretic Peptide   Result Value Ref Range    B-Natriuretic Peptide 12.4 0.0 - 100.0 pg/mL       Imaging:  XR Chest AP Portable   Final Result    Chronic elevation of the right diaphragm.      No acute cardiopulmonary abnormalities.      ReadingStation:[id number]          EKG: none Sinus rhythm; negative for ischemic changes.    EMERGENCY DEPARTMENT COURSE     ED Medication Orders (From admission, onward)    Start Ordered     Status Ordering Provider    10/31/19 1803 10/31/19 1802  aspirin chewable tablet 324 mg  Once in ED     Route: Oral  Ordered Dose: 324 mg     Last MAR action: Given LONG, JOSHUA D          ED documentation including nurses notes were reviewed by myself.  The case was discussed with the ED Attending.    ASSESSMENT & PLAN     Michael [name] is a 64 y.o. male admitted under OBSERVATION with   1.  Chest pain  2.  CAD/MI: S/P stents x4  3.  Diabetes: Has Insulin pump  4.  Hypertension  5.  Hyperlipidemia  6.  Family history CAD  7.  GERD  8.  Loop recorder  9.  Recent cholecystectomy  10. Esophageal stenosis: S/P recent dilation    Assessment & Plan:  1. Transfer to observation unit  2. Serial cardiac enzymes  3. Telemetry monitoring  4. Nitrates PRN  5. Analgesics/Antiemetics PRN  6. Aspirin daily  7. Fasting lipids   8. Repeat ECG PRN  9. Nuclear stress test and echocardiogram in AM  10. Cardiology consultation PRN patient followed by Dr. Howie Ill      I certify the need for admission to the Observation Unit based on the patient's history and the information above.    Rolin Barry, NP   10/31/2019 7:41 PM

## 2019-10-31 NOTE — ED Triage Notes (Signed)
Pt reports he was shopping and he began to feel a terrible pressure in his chest.  Pt states the pain radiated into his back.  Pt states he got in his car and drove  A little further and the pain got worse.  EMS gave patint a SL NTG and reports it lowered his BP.   1 Liter NS bolus

## 2019-10-31 NOTE — Progress Notes (Signed)
Subjective:    Patient ID: Michael [name] is a 64 y.o. male.    Patient is with a history of diabetes and TIA with left-sided weakness as well as spinal stenosis who is seen for exam and treatment of his lower extremity.  Since I last A1c was 8.1.  She was treated for ulcer and abscess at his last visit which healed uneventfully.  Denies any open lesions or drainage today but does complain of dystrophic tender thick nails which are difficult for him to manage.      The following portions of the patient's history were reviewed and updated as appropriate: allergies, current medications, past family history, past medical history, past social history, past surgical history and problem list.    Review of Systems   Constitutional: Negative.    Respiratory: Negative.    Cardiovascular: Positive for leg swelling.   Musculoskeletal: Negative.    Skin: Negative.    Neurological: Positive for numbness.   Hematological: Negative.    All other systems reviewed and are negative.    Pulse 100, temperature 97.1 F (36.2 C), temperature source Tympanic, resp. rate 17, weight 103.7 kg (228 lb 9.6 oz), SpO2 95 %.          Objective:    Physical Exam  Patient with warm dry supple skin over this lower extremity.  Patient with this previous wound site on his plantar left foot which is closed at today does have some hyperkeratotic buildup at this level.  Patient with nails that are thick dystrophic discolored and elongated x10.  Patient with decreased pulses but they are palpable with mild venous insufficiency and edema.  Patient protective sensation altered to distally with some numbness and burning in these feet.  Patient with decreased dorsiflexion eversion strength on this left side.      Assessment:       1. Diabetic peripheral neuropathy associated with type 2 diabetes mellitus    2. Onychomycosis    3. Ingrown nail    4. Foot drop, left foot    5. Pain in both feet          Plan:       Patient after consent did have these  nails debrided x10 without incident taking care remove this thickness and incurvated borders.  Patient also had debridement to this hyperkeratotic lesion on his plantar left foot with a 15 blade removing nonvital tissue.  Discussed the import of blood glucose control and regular foot exams and advised to call with new concerns or complaints.

## 2019-11-01 ENCOUNTER — Observation Stay: Payer: No Typology Code available for payment source

## 2019-11-01 ENCOUNTER — Encounter: Payer: Self-pay | Admitting: Emergency Medicine

## 2019-11-01 ENCOUNTER — Observation Stay (HOSPITAL_BASED_OUTPATIENT_CLINIC_OR_DEPARTMENT_OTHER): Payer: No Typology Code available for payment source

## 2019-11-01 DIAGNOSIS — R0789 Other chest pain: Secondary | ICD-10-CM

## 2019-11-01 DIAGNOSIS — R079 Chest pain, unspecified: Secondary | ICD-10-CM

## 2019-11-01 DIAGNOSIS — R0602 Shortness of breath: Secondary | ICD-10-CM

## 2019-11-01 LAB — LIPID PANEL
Cholesterol: 102 mg/dL (ref 75–199)
Cholesterol: 103 mg/dL (ref 75–199)
Coronary Heart Disease Risk: 4.64
Coronary Heart Disease Risk: 3.96
HDL: 22 mg/dL — ABNORMAL LOW (ref 40–55)
HDL: 26 mg/dL — ABNORMAL LOW (ref 40–55)
LDL Calculated: 13 mg/dL
LDL Calculated: 37 mg/dL
Triglycerides: 334 mg/dL — ABNORMAL HIGH (ref 10–150)
Triglycerides: 202 mg/dL — ABNORMAL HIGH (ref 10–150)
VLDL: 67 — ABNORMAL HIGH (ref 0–40)
VLDL: 40 (ref 0–40)

## 2019-11-01 LAB — HEMOGLOBIN A1C: Hgb A1C, %: 8.1 %

## 2019-11-01 LAB — B-TYPE NATRIURETIC PEPTIDE: B-Natriuretic Peptide: 10.3 pg/mL (ref 0.0–100.0)

## 2019-11-01 LAB — VH DEXTROSE STICK GLUCOSE
Glucose POCT: 94 mg/dL (ref 71–99)
Glucose POCT: 147 mg/dL — ABNORMAL HIGH (ref 71–99)
Glucose POCT: 166 mg/dL — ABNORMAL HIGH (ref 71–99)

## 2019-11-01 LAB — TROPONIN I
Troponin I: 0.01 ng/mL (ref 0.00–0.02)
Troponin I: <0.01 ng/mL (ref 0.00–0.02)

## 2019-11-01 MED ORDER — AMINOPHYLLINE 25 MG/ML IV SOLN
INTRAVENOUS | Status: AC
Start: 2019-11-01 — End: ?
  Filled 2019-11-01: qty 20

## 2019-11-01 MED ORDER — METOPROLOL TARTRATE 5 MG/5ML IV SOLN
INTRAVENOUS | Status: DC
Start: 2019-11-01 — End: 2019-11-01
  Filled 2019-11-01: qty 5

## 2019-11-01 MED ORDER — TECHNETIUM TC 99M SESTAMIBI - CARDIOLITE
10.30 | Freq: Once | Status: AC | PRN
Start: 2019-11-01 — End: 2019-11-01
  Administered 2019-11-01: 09:00:00 10.3 via INTRAVENOUS

## 2019-11-01 MED ORDER — BUTALBITAL-APAP-CAFFEINE 50-325-40 MG PO TABS
2.00 | ORAL_TABLET | Freq: Once | ORAL | Status: AC
Start: 2019-11-01 — End: 2019-11-01
  Administered 2019-11-01: 2 via ORAL
  Filled 2019-11-01: qty 2

## 2019-11-01 MED ORDER — ONDANSETRON 4 MG PO TBDP
4.00 mg | ORAL_TABLET | Freq: Once | ORAL | Status: AC
Start: 2019-11-01 — End: 2019-11-01
  Administered 2019-11-01: 14:00:00 4 mg via ORAL
  Filled 2019-11-01: qty 1

## 2019-11-01 MED ORDER — REGADENOSON 0.4 MG/5ML IV SOLN
0.40 mg | Freq: Once | INTRAVENOUS | Status: AC
Start: 2019-11-01 — End: 2019-11-01
  Administered 2019-11-01: 11:00:00 0.4 mg via INTRAVENOUS

## 2019-11-01 MED ORDER — TECHNETIUM TC 99M SESTAMIBI - CARDIOLITE
32.40 | Freq: Once | Status: AC | PRN
Start: 2019-11-01 — End: 2019-11-01
  Administered 2019-11-01: 11:00:00 32.4 via INTRAVENOUS

## 2019-11-01 MED ORDER — REGADENOSON 0.4 MG/5ML IV SOLN
INTRAVENOUS | Status: AC
Start: 2019-11-01 — End: ?
  Filled 2019-11-01: qty 5

## 2019-11-01 MED ORDER — AMINOPHYLLINE 25 MG/ML IV SOLN
100.00 mg | Freq: Once | INTRAVENOUS | Status: AC
Start: 2019-11-01 — End: 2019-11-01
  Administered 2019-11-01: 12:00:00 100 mg via INTRAVENOUS

## 2019-11-01 NOTE — Plan of Care (Signed)
Problem: Moderate/High Fall Risk Score >5  Goal: Patient will remain free of falls  11/01/2019 1839 by Bubba Camp, RN  Outcome: Completed  11/01/2019 1048 by Bubba Camp, RN  Outcome: Progressing  Flowsheets (Taken 11/01/2019 0850)  VH High Risk (Greater than 13):   ALL REQUIRED LOW INTERVENTIONS   RED "HIGH FALL RISK" SIGNAGE     Problem: Chest Pain  Goal: Vital signs and cardiac rhythm stable  Description: Interventions:  1. Monitor Michael [name] vital signs/cardiac rhythms  2. Monitor labs   3. Assess the need for oxygen therapy and administer as ordered  11/01/2019 1839 by Bubba Camp, RN  Outcome: Completed  11/01/2019 1048 by Bubba Camp, RN  Outcome: Progressing  Flowsheets (Taken 10/31/2019 2205 by Delfina Redwood, RN)  Vital signs and cardiac rhythm stable:   Monitor /assess vital signs/cardiac rhythms   Monitor labs   Assess the need for oxygen therapy and administer as ordered  Goal: Cardiac pain management  Description: Interventions:  1. Assess/report chest pain/or related discomfort to LIP immediately  2. Instruct patient to report any change in pain status  3. Assess pain/or related discomfort on admission, during daily assessment, before and after any intervention  4. Include patient and patient care companion in decisions related to pain management  11/01/2019 1839 by Bubba Camp, RN  Outcome: Completed  11/01/2019 1048 by Bubba Camp, RN  Outcome: Progressing  Flowsheets (Taken 10/31/2019 2205 by Delfina Redwood, RN)  Cardiac pain management:   Assess/report chest pain/or related discomfort to LIP immediately   Instruct patient to report any change in pain status   Assess pain/or related discomfort on admission, during daily assessment, before and after any intervention   Include patient and patient care companion in decisions related to pain management  Goal: Patient/Patient Care Companion demonstrates understanding of disease process, treatment plan, medications, and discharge  plan  Description: Interventions:  1. Educate patient to immediately report any chest pain/equivalent to RN  2. Reinforce with patient their activity level and to avoid Valsalva  3. Reinforce regarding cardiac diet and any fluid parameters  4. Nutrition consult as needed  5. Assist patient/patient care companion to identify measures for cardiac risk factor management  6. Consult/collaborate with Cardiac Rehabilitation  7. Assess need for smoking cessation and substance abuse counseling and refer as needed  8. Educate patient/patient care companion regarding identified learning needs  9. Provide appropriate resources and referrals  10. Plan for discharge  11/01/2019 1839 by Bubba Camp, RN  Outcome: Completed  11/01/2019 1048 by Bubba Camp, RN  Outcome: Progressing  Flowsheets (Taken 10/31/2019 2205 by Delfina Redwood, RN)  Patient/Patient Care Companion demonstrates understanding of disease process, treatment plan, medications and discharge plan:   Educate patient to immediately report any chest pain/equivalent to RN   Reinforce regarding cardiac diet and any fluid parameters   Nutrition consult as needed   Assist patient/patient care companion to identify measures for cardiac risk factor management   Consult/collaborate with Cardiac Rehabilitation   Assess need for smoking cessation and substance abuse counseling and refer as needed   Educate patient/patient care companion regarding identified learning needs   Plan for discharge   Provide appropriate resources and referrals

## 2019-11-01 NOTE — Progress Notes (Signed)
NURSE NOTE SUMMARY  [facility] - CLINICAL OBSERVATION UNIT   Patient Name: Michael [name]   Attending Physician: Dedra Skeens,*   Today's date:   11/01/2019 LOS: 0 days   Shift Summary:                                                              0700 Shift report completed, Assumed care of pt. Will continue to monitor.  1610 Shift assessment completed, POC reviewed, pt left unit via stretcher w/ transport for stress testing.  1320 Pt back from stress lab VSS, pt reports 3/10 substernal chest pressure and L side of face and mouth numb and tingling. Pt states,"This happened down in stress lab." Pt was noted to have in creased HR into 140's. Neuro WNL with exception of LLE weakness d/t HX of drop foot and ULE weaker d/t HX of neuropathy. I spoke w/ Tobby from stress lab. She reported pt was given reversal medication d/t  increased HR and was thought to be hyperventilating. Tobby reported pt had episode and it resolved before leaving stress lab.  1330 Provider made aware and at bedside, see new orders,   1352 Provider advised to give Zofran and Fioricet and CT scan ordered.  1526 Pt back from CT scan reports 1/10 pain Pain medication effective.  1700  Provider advised CT neg. Pt was given discharge paperwork, education given, all questions answered, Pt voiced understanding and agreement. No additional questions Tech removed IV, Pt awaiting ride at this time.  1745 Pt left unit via WC w/ transport.      Provider Notifications:        Rapid Response Notifications:  Mobility:      PMP Activity: Step 7 - Walks out of Room (11/01/2019  8:50 AM)     Weight tracking:  Family Dynamic:   Last 3 Weights for the past 72 hrs (Last 3 readings):   Weight   10/31/19 1454 103.9 kg (229 lb)             Last Bowel Movement   No data recorded

## 2019-11-01 NOTE — Progress Notes (Addendum)
NM cardiac stress test completed. Lexiscan 0.4mg  IV given over 20 seconds. + sob after Lexiscan injection. Po caffeine given. Improved in recovery. Tolerated well.      1125  While in cv holding, per tele, HR 140s.  Brought to stress room, placed on monitor. 6/10 cp.  02 at 2 lpm nc placed on pt. 1st dose of Aminophylline given by P. Shutt.  Slight improvement.  2nd dose given.  Improved after 2nd dose of Aminophylline 100 mg given.  FS = 147.  Pt agrees to proceed with test. Cp improved 1/10.       Lebron Conners, RN, RRT  Nuclear Card Stress Lab RN  [phone]

## 2019-11-01 NOTE — Plan of Care (Incomplete)
Problem: Moderate/High Fall Risk Score >5  Goal: Patient will remain free of falls  Outcome: Progressing  Flowsheets (Taken 11/01/2019 0850)  VH High Risk (Greater than 13):   ALL REQUIRED LOW INTERVENTIONS   RED "HIGH FALL RISK" SIGNAGE     Problem: Chest Pain  Goal: Vital signs and cardiac rhythm stable  Description: Interventions:  1. Monitor Michael [name] vital signs/cardiac rhythms  2. Monitor labs   3. Assess the need for oxygen therapy and administer as ordered  Outcome: Progressing  Flowsheets (Taken 10/31/2019 2205 by Delfina Redwood, RN)  Vital signs and cardiac rhythm stable:   Monitor /assess vital signs/cardiac rhythms   Monitor labs   Assess the need for oxygen therapy and administer as ordered  Goal: Cardiac pain management  Description: Interventions:  1. Assess/report chest pain/or related discomfort to LIP immediately  2. Instruct patient to report any change in pain status  3. Assess pain/or related discomfort on admission, during daily assessment, before and after any intervention  4. Include patient and patient care companion in decisions related to pain management  Outcome: Progressing  Flowsheets (Taken 10/31/2019 2205 by Delfina Redwood, RN)  Cardiac pain management:   Assess/report chest pain/or related discomfort to LIP immediately   Instruct patient to report any change in pain status   Assess pain/or related discomfort on admission, during daily assessment, before and after any intervention   Include patient and patient care companion in decisions related to pain management  Goal: Patient/Patient Care Companion demonstrates understanding of disease process, treatment plan, medications, and discharge plan  Description: Interventions:  1. Educate patient to immediately report any chest pain/equivalent to RN  2. Reinforce with patient their activity level and to avoid Valsalva  3. Reinforce regarding cardiac diet and any fluid parameters  4. Nutrition consult as needed  5. Assist patient/patient  care companion to identify measures for cardiac risk factor management  6. Consult/collaborate with Cardiac Rehabilitation  7. Assess need for smoking cessation and substance abuse counseling and refer as needed  8. Educate patient/patient care companion regarding identified learning needs  9. Provide appropriate resources and referrals  10. Plan for discharge  Outcome: Progressing  Flowsheets (Taken 10/31/2019 2205 by Delfina Redwood, RN)  Patient/Patient Care Companion demonstrates understanding of disease process, treatment plan, medications and discharge plan:   Educate patient to immediately report any chest pain/equivalent to RN   Reinforce regarding cardiac diet and any fluid parameters   Nutrition consult as needed   Assist patient/patient care companion to identify measures for cardiac risk factor management   Consult/collaborate with Cardiac Rehabilitation   Assess need for smoking cessation and substance abuse counseling and refer as needed   Educate patient/patient care companion regarding identified learning needs   Plan for discharge   Provide appropriate resources and referrals

## 2019-11-01 NOTE — UM Notes (Signed)
DOS 10/31/19  obs     presented with chest pain.  Patient states that earlier today he felt exhausted and short of breath.  He had midsternal chest pain which he describes as a pressure and a sharp pain.  The pain radiated to his back and his neck.  He had associated shortness of breath, diaphoresis nausea and dizziness.  He also felt bloated.  2 days ago he was seen at [facility] for complaints of lower extremity pain and swelling.  He was told that he might have cellulitis however no antibiotic was prescribed.  He saw his PCP the following day and was started on Lasix.  He took his first dose today.  He denies recent fever chills cough sore throat abdominal pain vomiting or diarrhea.  He states he had his gallbladder out in May and he had an endoscopy in June to have his esophagus stretched.     Assessment & Plan:    OBSERVATION with   1.  Chest pain  2.  CAD/MI: S/P stents x4  3.  Diabetes: Has Insulin pump  4.  Hypertension  5.  Hyperlipidemia  6.  Family history CAD  7.  GERD  8.  Loop recorder  9.  Recent cholecystectomy  10. Esophageal stenosis: S/P recent dilation    Assessment & Plan:  1. Transfer to observation unit  2. Serial cardiac enzymes  3. Telemetry monitoring  4. Nitrates PRN  5. Analgesics/Antiemetics PRN  6. Aspirin daily  7. Fasting lipids   8. Repeat ECG PRN  9. Nuclear stress test and echocardiogram in AM  10. Cardiology consultation PRN patient followed by Dr. Howie Ill    Trop neg x 1     DOS 11/01/19  obs     Echo    1: The left ventricular cavity size, wall thickness, and systolic function are normal with no regional wall                        motion abnormalities present. The left ventricular ejection fraction is normal, estimated at 55-60%. There is                        normal diastolic function.     NM stress pending     Andreas Blower, RN, BSN  [facility]   Utilization Management  Phone:  [phone]  Fax:  [phone]

## 2019-11-01 NOTE — Discharge Summary (Addendum)
[facility]BSERVATION [facility]      Patient: Michael [name]  Admission Date: 10/31/2019   DOB: 13-Mar-1956  Discharge Date: 11/01/2019    MRN: [id number]  Discharge Attending: Dedra Skeens   Referring Physician: Clifton James, PA  PCP: Clifton James, PA       DISCHARGE SUMMARY     Discharge Information   Admission Diagnosis:   Chest pain.    Discharge Diagnosis:   Patient Active Problem List    Diagnosis Date Noted   . Unstable angina 09/06/2018   . Spinal stenosis 02/17/2018   . Type 2 diabetes mellitus with diabetic neuropathy 02/17/2018   . Left-sided weakness 03/08/2017   . TIA (transient ischemic attack) 03/05/2017   . Cellulitis of finger 10/22/2014   . Type 2 diabetes mellitus 10/22/2014   . Hypertension 10/22/2014   . Cellulitis of middle finger, left 10/22/2014   . Glucosuria 09/08/2014   . BPH (benign prostatic hyperplasia) 09/08/2014   . Balanitis 09/08/2014   . Chest pain 03/18/2013   . CAD (coronary artery disease) 12/13/2012        Discharge Medications:     Medication List      ASK your doctor about these medications    acetaminophen 500 MG tablet  Commonly known as: TYLENOL     atorvastatin 80 MG tablet  Commonly known as: LIPITOR  Ask about: Which instructions should I use?     CALCIUM-MAGNESIUM-ZINC PO     cetirizine 10 MG tablet  Commonly known as: ZyrTEC     cholestyramine 4 g packet  Commonly known as: QUESTRAN     clopidogrel 75 mg tablet  Commonly known as: PLAVIX  Take 1 tablet (75 mg total) by mouth daily.     DULoxetine 60 MG capsule  Commonly known as: CYMBALTA     Farxiga 10 MG Tabs  Generic drug: Dapagliflozin Propanediol     fluticasone 50 MCG/ACT nasal spray  Commonly known as: FLONASE     furosemide 20 MG tablet  Commonly known as: LASIX     hydroCHLOROthiazide 12.5 MG capsule  Commonly known as: MICROZIDE     hydrOXYzine 50 MG capsule  Commonly known as: VISTARIL  Take 1 capsule (50 mg total) by mouth nightly.     insulin (patient own pump): TOTAL DAILY BASAL  units     lidocaine 5 %  Commonly known as: LIDODERM  Ask about: Which instructions should I use?     metFORMIN 500 MG 24 hr tablet  Commonly known as: GLUCOPHAGE-XR     metoprolol tartrate 100 MG tablet  Commonly known as: LOPRESSOR     nitroglycerin 0.4 MG SL tablet  Commonly known as: NITROSTAT  Place 1 tablet (0.4 mg total) under the tongue every 5 (five) minutes as needed for Chest pain     One-A-Day Mens Tabs     pregabalin 50 MG capsule  Commonly known as: LYRICA     Vascepa 1 g capsule  Generic drug: icosapent ethyl     vitamin B-12 1000 MCG tablet  Commonly known as: CYANOCOBALAMIN     vitamin D (ergocalciferol) 50000 UNIT Caps  Commonly known as: [facility]                Hospital Course   Presentation History   Patient was admitted for evaluation of chest pain.  He had shortness of breath with the pain.  He described the pain as pressure.  Pain radiates to his back.  Hospital Course (0 Days)   CBC was normal.  Glucose was 178.  Chem-8 was otherwise normal.  Serial troponins were normal.  D-dimer was normal.  Chest x-ray had no acute changes.  Echocardiogram was normal.  Nuclear medicine stress test was low risk.  Today he complained of a headache and some left facial paresthesias.  Head CT was negative.    Procedures/Imaging:   CT Head WO Contrast   Final Result   No acute intracranial abnormalities.      ReadingStation:[email or web address]      NM Myocardial Perfusion Spect (Stress And Rest)   Final Result      Echocardiogram Adult Complete W Clr/ Dopp Waveform   Final Result      XR Chest AP Portable   Final Result   Chronic elevation of the right diaphragm.      No acute cardiopulmonary abnormalities.      ReadingStation:[id number]      CV Cardiac Stress Test Tracing Only    (Results Pending)       Treatment Team:   Attending Provider: Dedra Skeens, MD       Progress Note/Physical Exam at Discharge   Vitals:    11/01/19 1206 11/01/19 1307 11/01/19 1540 11/01/19 1548   BP: 131/88 139/84 113/73 120/72    Pulse: (!) 102 (!) 104 87 88   Resp: 16 15 15 16    Temp:  97.2 F (36.2 C) 97.5 F (36.4 C) 97.9 F (36.6 C)   TempSrc:  Temporal Temporal Temporal   SpO2:  99% 98% 94%   Weight:       Height:           Patient is alert in no distress.  Sensation of his face is intact but subjectively slightly different on the left than the right.  Face is symmetric.Marland Kitchen  Extraocular movements are intact.  Arm strength is symmetric.  Left foot dorsiflexion is weak.  Patient states that this is chronic.     Diagnostics     Stress Test Results: Low risk.    Labs/Studies Pending at Discharge: No    Last Labs   Recent Labs   Lab 10/31/19  1500   WBC 6.7   RBC 4.83   Hemoglobin 14.5   Hematocrit 41.5   MCV 86   PLT CT 158       Recent Labs   Lab 10/31/19  1500   Sodium 143   Potassium 3.9   Chloride 104   CO2 30   BUN 14   Creatinine 1.14   Glucose 178*   Calcium 9.2        Patient Instructions   Discharge Diet: regular diet  Discharge Activity:  activity as tolerated    Follow Up Appointment:       Time spent examining patient, discussing with patient/family regarding hospital course, chart review, reconciling medications and discharge planning: 20 minutes.      Donalynn Furlong, MD    4:31 PM 11/01/2019

## 2019-11-01 NOTE — Progress Notes (Addendum)
Nuclear stress test - 0.4 mg lexiscan injected over 20 seconds; no st changes or chest pain; sestamibi images pending    Addendum:  Approximately 30 minutes into recovery, telemetry called dept to state pt's hr=140; when checked on pt, pt c/o 6/10 chest pain, 100 mg aminophylline given; pt still c/o 6/10 chest pain with numbness/tingling in facial area; placed on 2L O2; bp elevated up to 159/94, metoprolol given, bs=147; 2nd dose of 100 mg aminophylline given - cp 1/10. bp 131/88    Charolette Forward, [place] [address]

## 2019-11-03 ENCOUNTER — Encounter (INDEPENDENT_AMBULATORY_CARE_PROVIDER_SITE_OTHER): Payer: Self-pay | Admitting: NEUROSURGERY

## 2019-11-03 LAB — ECG 12-LEAD
Patient Age: 63 years
Q-T Interval(Corrected): 436 ms
Q-T Interval: 362 ms
QRS Axis: 19 deg
QRS Duration: 93 ms
Ventricular Rate: 87 //min

## 2019-11-03 NOTE — Progress Notes (Signed)
Edwin [name], MEDICAL OFFICE BUILDING [address]  [address] [place] [address]  [phone]        Name: Edwin [name]  MRN: [id number]  Age: 64 y.o.  DOB: 1956-02-06  ENCOUNTER DATE: 09/17/2019    CHIEF COMPLAINT: Follow-up After Testing (with new CT cervical 09/04/19 @ [facility] w/pt today)      HPI:    Patient is a very pleasant 64 year old gentleman whoI initially saw in June of 2029forback and leg painevaluation. He came from Neurology, Dr Edwin [name] evaluation of stenosis. Hecontinues to statethat 75% his pain is in his back.  He is on medical disability.  He describes his pain as shooting stabbing in his neck and in his back.  He rates as an 8/10. He does get bilateral leg pains and weakness. He feels a ease tripping and falling. He has had chronic left foot weakness and foot drop.[place] had be quickly returned to the operating room within several days for either hematoma or spinal fluid leak.He has also had previous neck surgery in 2010. He has been diabetic for some 25 years andhadblood sugarsthat ran between300-400. Hetransitionedover to insulin at this stageand now his hemoglobin A1c 1 from a 9 to a 7.4. He is using an insulin pump now for the last 2 months. He has extensive pulmonary and cardiac issues as well as his diabetes. He has cardiac issues and pulmonary issues. He had an MRI in 2010. He has had progressive weakness in his upper and lower extremities. He had recent left carpal tunnel and left ulnar nerve release. He comes [facility].  He recently had gallbladder surgery outside area. He has been on chronic anticoagulation. He feels numb from his knees down. He has difficulty with tripping and falling. He is having fine motor control problems and his upper extremities as well. MRI scan was performed and neurosurgery was asked to comment on his lumbar stenosis.   He was last seen in November 2020previous imaging was reviewed.   He has varying degrees of stenosis. He has chronic muscle wasting on exam both distal upper and lower extremity weakness left worse than right. He has a left chronic footdrop. He had hamstring injury a year ago as well. Prior surgery was at the L4-5 and L5-S1 levels from a left-sided approach and he has gone on to auto fuse the L5-S1 level. There was moderate to severe stenosis at the L3-4 level with facet arthropathy seen at this level as well as L4-5. 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  Most of his pain remains in his back and he denies any radicular pain.  His right and left legs do ache.  He states his hips are very painful It is described as burning shooting stabbing and shock-like specially in his lower extremities into the shins and feet. He did have a history of MRSA infection and subsequent lost his distal digit in his upper extremity. Describes the burning pain in his shins bilaterally as a "cattle prod". 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 Activities universallyworsen his pain. Frequent movements and adjusting himself and slowing downhis activitiesdoes ease things off.He is not working because of disability. Both legs are equal involved. His pain continues to be stabbing and sharp with radiations up between shoulder blades and down into his hips knees in lower extremities. He cannot stand for long period of time. Walking sitting in  leg or twisting rolling worsens his pain.  He complains of worsening low back pain as well as leg, hip,and knee pains. He has "excruciating pain"between his shoulder blades. His sugars are better controlled now and he has been improved for an insulin pump. He continues on Plavix. He has not been established yet with pain management. He comes in for 3 month follow-upbut with no new studies. He had extreme claustrophobia and  could not get the cervical, thoracic, and lumbar spine scans performed.  Pain management saw him in wanted him to return.  He does have a neurologist, Dr. Bryn [name], and [place].  He has not been Rheumatology.  He did have a history of left carpal and left ulnar nerve release in [place].  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  He has had this on 3 or 4 occasions.  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  He has have more problems with Dupuytren's in his left hand in his right.  Despite me reviewing previous studies Dr. Shaune [name] wanted me to look at the studies once again.  He has chronic issues as noted.    Patient Active Problem List   Diagnosis   . Spondyloarthropathy       Past Surgical History:   Procedure Laterality Date   . 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 HAND SURGERY Left    . HX BACK SURGERY      Low Back   . HX CERVICAL SPINE SURGERY     . HX CHOLECYSTECTOMY  08/21/2019   . HX LUMBAR SPINE SURGERY  2000    in [place]    . HX STENTING (ANY)  2010    cardiac stent x 4    . NECK SURGERY         acetaminophen (TYLENOL) 500 mg Oral Tablet, Take 500 mg by mouth Every 4 hours as needed for Pain  albuterol sulfate (PROVENTIL OR VENTOLIN OR PROAIR) 90 mcg/actuation Inhalation HFA Aerosol Inhaler, Take 1-2 Puffs by inhalation Every 6 hours as needed  aspirin (ECOTRIN) 81 mg Oral Tablet, Delayed Release (E.C.), Take 1 Tablet (81 mg total) by mouth Once a day  atorvastatin (LIPITOR) 80 mg Oral Tablet, Take 1 Tablet (80 mg total) by mouth Every evening  calcium carbonate (CALCIUM 300 ORAL), Take 1,000 mg by mouth Once a day  cetirizine (ZYRTEC) 10 mg Oral Tablet, Take 10 mg by mouth Once a day  cholecalciferol, vitamin D3, 1,000 unit Oral Tablet, Take 1,000 Units by mouth Every 7 days   clopidogreL (PLAVIX) 75 mg Oral Tablet, Take 1 Tablet (75 mg total) by mouth Once a day  cyanocobalamin (VITAMIN B 12) 1,000 mcg Oral Tablet,  Take 1,000 mcg by mouth Once a day  DULoxetine (CYMBALTA DR) 60 mg Oral Capsule, Delayed Release(E.C.), Take 1 Capsule (60 mg total) by mouth Once a day  fluticasone propionate (FLONASE) 50 mcg/actuation Nasal Spray, Suspension, 1 Spray by Each Nostril route Once a day  hydrOXYzine pamoate (VISTARIL) 50 mg Oral Capsule, Take 50 mg by mouth Three times a day as needed for Itching  isosorbide mononitrate (IMDUR) 60 mg Oral Tablet Sustained Release 24 hr, Take 1 Tab (60 mg total) by mouth Every morning  lidocaine (LIDODERM) 5 % Adhesive Patch, Medicated, 1 Patch by Transdermal route Once a day  magnesium oxide,aspartate,citr 400 mg magnesium Oral Capsule, Take by mouth Once a  day  metFORMIN (GLUCOPHAGE XR) 500 mg Oral Tablet Sustained Release 24 hr, Take 1 Tablet (500 mg total) by mouth Once a day  metoprolol tartrate (LOPRESSOR) 100 mg Oral Tablet, Take 1 Tablet (100 mg total) by mouth Twice daily  pantoprazole (PROTONIX) 40 mg Oral Tablet, Delayed Release (E.C.), Take 40 mg by mouth Once a day  predniSONE (DELTASONE) 10 mg Oral Tablet, Take by mouth 6 tabs in the morning on day 1 followed by 5 tabs on day 2, 4 tabs on day 3, 3 tabs on day 4, 2 tabs on day 5, and 1 tab on day 6 then stop.  Do not take with NSAIDs. (Patient not taking: Reported on 09/17/2019)  pregabalin (LYRICA) 50 mg Oral Capsule, Take 50 mg by mouth Three times a day  VASCEPA 1 gram Oral Capsule, Take 2 g by mouth Twice daily  zinc sulfate (ZINC-15 ORAL), Take by mouth Once a day    No facility-administered medications prior to visit.      Allergies   Allergen Reactions   . Gabapentin  Other Adverse Reaction (Add comment)     hallucinations     . Morphine  Other Adverse Reaction (Add comment)     Hallucinations     . Tylenol Pm [Diphenhydramine-Acetaminophen]      Patient denied     . Glucophage [Metformin] Nausea/ Vomiting     Resolved    . Keflex [Cephalexin] Nausea/ Vomiting   . Victoza [Liraglutide] Nausea/ Vomiting       Family Medical History:      Problem Relation (Age of Onset)    Coronary Artery Disease Mother, Father    Diabetes Mother, Sister    Heart Attack Mother, Father    Stroke Mother, Father              Social History     Socioeconomic History   . Marital status: Single     Spouse name: Not on file   . Number of children: Not on file   . Years of education: Not on file   . Highest education level: Not on file   Tobacco Use   . Smoking status: Never Smoker   . Smokeless tobacco: Never Used   Substance and Sexual Activity   . Alcohol use: No   . Drug use: No   Other Topics Concern     Social Determinants of Health     Financial Resource Strain:    . Difficulty of Paying Living Expenses:    Food Insecurity:    . Worried About Programme researcher, broadcasting/film/video in the Last Year:    . Barista in the Last Year:    Transportation Needs:    . Freight forwarder (Medical):    Marland Kitchen Lack of Transportation (Non-Medical):    Physical Activity:    . Days of Exercise per Week:    . Minutes of Exercise per Session:    Stress:    . Feeling of Stress :    Intimate Partner Violence:    . Fear of Current or Ex-Partner:    . Emotionally Abused:    Marland Kitchen Physically Abused:    . Sexually Abused:        REVIEW OF SYSTEMS:     12 systems reviewed and all are negative with the exception of what is listed in the HPI    PHYSICAL EXAM:    BP 118/83   Pulse 83   Ht 1.727 m (5'  8")   Wt 102 kg (224 lb)   BMI 34.06 kg/m         Physical exam:Patient is in no acute distress. Ambulation into the room and about the room is somewhat impaired. There is chronic wasting of his left lower extremity.He has a fairly unsteady gait    HEENT: Normocephalic. Atraumatic. No crepitus. No nuchal tenderness. No facial asymmetry. Hears well bilateral with normal speech production as well. No malocclusion of the jaw. No TMJ pain elicited. Dentition intact. Mucous membranes moist. Conjunctiva clear. No erythema or exudates. No battle signs or raccoon's eyes. External ear canal  without lesions. No otorrhea or rhinorrhea.    Neck: Good carotid upstrokes. No adenopathy. Trachea midline. No crepitus. No meningeal signs.    CV: No JVD. No murmurs. Regular pulse.    Pulmonary: No shortness of breath with muscle testing. No dyspnea with exertion or with talking. Equal air exchange. No dullness to percussion. No rib dysfunction with palpation. No chest wall deformity. No use of accessory muscles of respiration.    Abdomen: Soft, nontender, no masses, no guarding or rebound. No significant hernias.    Back: No tenderness to percussion. No point tenderness. Range-of-motion appropriate.    Musculoskeletal: Range of motion of shoulders is appropriate without crepitus or limitation. Muscle mass is decreased throughout especially distally. He has wasting of his intrinsics and thenar and hypothenar eminences. He has a digit missing in the distal 4th finger on the left hand.Hips and knees show no swelling either. Dupuytren's contracture noted bilaterally, left greater than right.No palpable step-offs. No scoliotic deformities or excessive kyphosis. No soft tissue masses.    Neurologic: Alert oriented x4. Speech and affect appropriate. Speech output appropriate. Cognition clear. Thought processes and cadence of speech appropriate. Cranial nerves 2-12 appropriate. Tongue without fasciculations. Motor strengthshows some asymmetry, distal upper lower extremities weaker than proximal, left side weaker in left lower extremity with greater than right lower extremitySensation impaired bilaterally again distally greater than proximally and left side worse than right. Loss of vibration and proprioception and pinprick and light touch discrepancies noted bilaterally. Fine motor control is slowed. No cerebellar dysfunction. Significantgait ataxiaexist but heambulates without assistance. Gets up from a seated position well. Memory and behavior intact.No  spasticity no hypertonicity no crossed adductor     Peripheral vascular: Warm extremities bilaterally. Good pulses bilaterally.    Skin: No rashes or lesions.    Lymphatics: No adenopathy.          IMAGING:    I personally reviewed this film and radiology report, my interpretation is as follows:  Old studies from [facility] from 5, 15, 2019 reviewed    I have reviewed the following:Evidence of fused L5-S1 segment. The segment was operated on from a left-sided approach. Osteophytic disc noted. Facet arthropathy seen. Loss of muscle taken. L4-5 has similar findings with facet arthropathy postsurgical changes to the left. Appears to be evidence of nerve root clumping especially L4-5 5 1. Foraminal stenosis and central stenosis noted at L L3-4 with an asymmetric facet complex to the left. Moderate to severe stenosis noted here. Diffuse sarcopenia noted as well. No instability or lytic lesions noted       Lumbar x-rays showed diffuse lumbar spondylosis. Evidence of anterior osteophytes are seen. Facet arthropathy noted. No lytic or destructive lesions seen.  No spondylolisthesis  Studies from [facility] in [place] were reviewed.  They are poorly formatted in the computer over.  He has accentuated thoracic kyphosis.  He has sagittal imbalance with forward head over his hips.  No severe thoracic cord compromise is seen.  Diffuse auto fusion occurring.  Lumbar spine shows fusion at L5-S1.  Disc desiccation seen at L3-4 and L4-5.  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  Facet arthropathy seen.  Kissing facets at L L3-4.  Postsurgical changes seen especially at L5-S1.  Is cervical MRI scan shows circumferential anterior-posterior operations.  No evidence of cord compromise seen or myelomalacia in the cord.  Anterior posterior instrumentation is in place no significant adjacent  segment stenosis or lesions exist      ASSESSMENT:    ENCOUNTER DIAGNOSES     ICD-10-CM   1. Spondyloarthropathy  M47.819   2. 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 Spinal stenosis of lumbar region without neurogenic claudication  M48.061   4. History of lumbar laminectomy for spinal cord decompression  Z98.890   5. Muscular atrophy, unspecified site  M62.50   6. Chronic neck pain  M54.2    G89.29   7. Chronic back pain  M54.9    G89.29   8. DISH (diffuse idiopathic skeletal hyperostosis)  M48.10   9. Discogenic thoracic pain  M54.6   10. Diabetic peripheral neuropathy (CMS HCC)  E11.42   11. Cardiac disease  I51.9   12. Poorly controlled diabetes mellitus (CMS HCC)  E11.65   13. Left foot drop  M21.372   14. Sensory ataxic gait  R26.0   15. History of MRSA infection  Z86.14   16. Lumbar stenosis  M48.061   17. Claustrophobia  F40.240   18. History of back surgery  Z98.890       PLAN:    Ayhem remains with extensive complaints.  He looks better now he is reasonably controlled with his underlying problems.  Correcting his sagittal balance would involve extensive thoracolumbar fusion and he is not ready to have this.  He did have circumferential spinal cord decompression of his cervical spine when he had myelopathy.  This then led to surgery at L5-S1 also.  From my standpoint no further surgery is warranted.  Expectations given his multiple problems are low that he will have significant long standing pain relief.  He is more engaged in his care.  From my standpoint no surgery follow-up is warranted unless something changes.  Will continue with pain management with lower expectations given the diffuse and chronic condition he faces.   All films were extensively reviewed with the patient.  Pathology relating to the patient's condition were outlined on the films and implications to the physical exam and present presenting illness were discussed at length.  Ramifications of the studies were further  elucidated by use of models and other devices.  Good understanding of the meaning of the rendered diagnosis or diagnoses was obtained.  All questions were answered that were posed.      No orders of the defined types were placed in this encounter.        The patient was given the opportunity to ask questions and those questions were answered to the patient's satisfaction. The patient was encouraged to call with any additional questions or concerns. Instructed patient to call back if symptoms worse.     FOLLOW UP:    No follow-ups on file.  He can continue follow-up with pain management.  No surgical intervention recommended or needed  at this stage    Kayren Eaves, MD

## 2019-11-04 LAB — ECG 12-LEAD
P Wave Axis: 51 deg
P-R Interval: 204 ms
Patient Age: 63 years
Q-T Interval(Corrected): 442 ms
Q-T Interval: 348 ms
QRS Axis: 31 deg
QRS Duration: 83 ms
T Axis: 5 years
Ventricular Rate: 97 //min

## 2019-11-07 ENCOUNTER — Ambulatory Visit (INDEPENDENT_AMBULATORY_CARE_PROVIDER_SITE_OTHER): Payer: Self-pay | Admitting: Cardiovascular Disease

## 2019-11-07 NOTE — Nursing Note (Signed)
Received vm from patient advising he is having continued chest pain bloating swelling coughing and pain in legs. Attempted to return call no answer lvm.Lauree Chandler, LPN

## 2019-11-12 ENCOUNTER — Encounter (INDEPENDENT_AMBULATORY_CARE_PROVIDER_SITE_OTHER): Payer: Self-pay | Admitting: Cardiovascular Disease

## 2019-11-12 NOTE — Progress Notes (Signed)
Cardiac risk assessment faxed to Dr. Shaune Pollack @ [phone]

## 2019-11-13 ENCOUNTER — Encounter (INDEPENDENT_AMBULATORY_CARE_PROVIDER_SITE_OTHER): Payer: Self-pay | Admitting: Cardiovascular Disease
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
## 2019-11-18 ENCOUNTER — Emergency Department
Admission: EM | Admit: 2019-11-18 | Discharge: 2019-11-18 | Disposition: A | Discharge status: Home / Self Care | Payer: No Typology Code available for payment source | Attending: Emergency Medicine | Admitting: Emergency Medicine

## 2019-11-18 ENCOUNTER — Emergency Department: Payer: No Typology Code available for payment source

## 2019-11-18 DIAGNOSIS — R079 Chest pain, unspecified: Secondary | ICD-10-CM | POA: Insufficient documentation

## 2019-11-18 DIAGNOSIS — R05 Cough: Secondary | ICD-10-CM | POA: Insufficient documentation

## 2019-11-18 DIAGNOSIS — R059 Cough, unspecified: Secondary | ICD-10-CM

## 2019-11-18 LAB — ECG 12-LEAD
Patient Age: 63 years
Q-T Interval(Corrected): 429 ms
Q-T Interval: 350 ms
QRS Axis: 35 deg
QRS Duration: 85 ms
T Axis: 4 years
Ventricular Rate: 90 //min

## 2019-11-18 LAB — CBC AND DIFFERENTIAL
Basophils %: 0.6 % (ref 0.0–3.0)
Basophils Absolute: 0 10*3/uL (ref 0.0–0.3)
Eosinophils %: 3.7 % (ref 0.0–7.0)
Eosinophils Absolute: 0.3 10*3/uL (ref 0.0–0.8)
Hematocrit: 45.7 % (ref 39.0–52.5)
Hemoglobin: 15.6 gm/dL (ref 13.0–17.5)
Lymphocytes Absolute: 2.2 10*3/uL (ref 0.6–5.1)
Lymphocytes: 29.7 % (ref 15.0–46.0)
MCH: 29 pg (ref 28–35)
MCHC: 34 gm/dL (ref 32–36)
MCV: 86 fL (ref 80–100)
MPV: 7.4 fL (ref 6.0–10.0)
Monocytes Absolute: 0.6 10*3/uL (ref 0.1–1.7)
Monocytes: 8.3 % (ref 3.0–15.0)
Neutrophils %: 57.7 % (ref 42.0–78.0)
Neutrophils Absolute: 4.3 10*3/uL (ref 1.7–8.6)
PLT CT: 209 10*3/uL (ref 130–440)
RBC: 5.32 10*6/uL (ref 4.00–5.70)
RDW: 13.2 % (ref 11.0–14.0)
WBC: 7.5 10*3/uL (ref 4.0–11.0)

## 2019-11-18 LAB — COMPREHENSIVE METABOLIC PANEL
ALT: 29 U/L (ref 0–55)
AST (SGOT): 27 U/L (ref 10–42)
Albumin/Globulin Ratio: 1.21 Ratio (ref 0.80–2.00)
Albumin: 4 gm/dL (ref 3.5–5.0)
Alkaline Phosphatase: 64 U/L (ref 40–145)
Anion Gap: 11.6 mMol/L (ref 7.0–18.0)
BUN / Creatinine Ratio: 14.8 Ratio (ref 10.0–30.0)
BUN: 16 mg/dL (ref 7–22)
Bilirubin, Total: 3.6 mg/dL — ABNORMAL HIGH (ref 0.1–1.2)
CO2: 30 mMol/L (ref 20–30)
Calcium: 9.9 mg/dL (ref 8.5–10.5)
Chloride: 102 mMol/L (ref 98–110)
Creatinine: 1.08 mg/dL (ref 0.80–1.30)
EGFR: 72 mL/min/{1.73_m2} (ref 60–150)
Globulin: 3.3 gm/dL (ref 2.0–4.0)
Glucose: 263 mg/dL — ABNORMAL HIGH (ref 71–99)
Osmolality Calculated: 288 mOsm/kg (ref 275–300)
Potassium: 4.6 mMol/L (ref 3.5–5.3)
Protein, Total: 7.3 gm/dL (ref 6.0–8.3)
Sodium: 139 mMol/L (ref 136–147)

## 2019-11-18 LAB — TROPONIN I
Troponin I: 0.02 ng/mL (ref 0.00–0.02)
Troponin I: <0.01 ng/mL (ref 0.00–0.02)

## 2019-11-18 MED ORDER — ISOSORBIDE MONONITRATE ER 30 MG PO TB24
30.00 mg | ORAL_TABLET | Freq: Every day | ORAL | 0 refills | Status: AC
Start: 2019-11-18 — End: ?

## 2019-11-18 MED ORDER — ISOSORBIDE MONONITRATE ER 30 MG PO TB24
30.00 mg | ORAL_TABLET | Freq: Every day | ORAL | Status: DC
Start: 2019-11-18 — End: 2019-11-18
  Administered 2019-11-18: 21:00:00 30 mg via ORAL
  Filled 2019-11-18 (×2): qty 1

## 2019-11-18 MED ORDER — NITROGLYCERIN 0.4 MG SL SUBL
0.40 mg | SUBLINGUAL_TABLET | SUBLINGUAL | Status: DC | PRN
Start: 2019-11-18 — End: 2019-11-18
  Administered 2019-11-18: 20:00:00 0.4 mg via SUBLINGUAL

## 2019-11-18 MED ORDER — NITROGLYCERIN 0.4 MG SL SUBL
SUBLINGUAL_TABLET | SUBLINGUAL | Status: AC
Start: 2019-11-18 — End: ?
  Filled 2019-11-18: qty 25

## 2019-11-18 MED ORDER — BENZONATATE 100 MG PO CAPS
100.00 mg | ORAL_CAPSULE | Freq: Three times a day (TID) | ORAL | 0 refills | Status: DC | PRN
Start: 2019-11-18 — End: 2020-04-02

## 2019-11-18 NOTE — ED Provider Notes (Signed)
History     Chief Complaint   Patient presents with    Shortness of Breath    Chest Pain     He presents for evaluation of past chest pain sudden onset a few minutes prior to his arrival today. He states that he was driving to the area from his home in [place] to go to a cardiology appointment tomorrow when he started to experience these pains. He describes his pain as a sharp sensation.  He states that his pain has been severe.  He states that his pain radiates to his back and neck.  He is still experiencing milder chest pressure. Nothing in particular makes his pain better or worse. He took aspirin today.  He did not take nitroglycerin. He states that his blood sugar was 300 at the onset of his symptoms. He reports associated blurry vision,  shortness of breath, nausea, diaphoresis, and palpitations. He denies vomiting. He has had a cough.  His cough has been occasionally productive. He denies headache.  He denies dizziness or vertigo. He denies fever or chills. He denies trouble swallowing.  He denies abdominal pain.  He denies black stools or blood in the stool. He does have lower extremity edema. He denies that he has any sores on his legs. He has a history of coronary artery disease.  He states that he has 4 stents. He reportedly had his first stent placed in 2000. His primary cardiologist is Dr. Howie Ill. He has been taking lasix recently. He otherwise denies recent medication changes. He was diagnosed with diabetes in 1997. He denies that he has any known kidney issues. 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           Past Medical History:   Diagnosis Date    CAD (coronary artery disease)     Cellulitis of finger of left hand 10/22/14    Diabetes mellitus     Gastroesophageal reflux disease     Hernia     Hyperlipidemia     Hypertension     Myocardial infarction     Neuropathy     diabetic    Presence of stent in coronary artery     4 STENTS    Sleep apnea     TIA (transient  ischemic attack)     Type 2 diabetes mellitus, controlled     On insulin pump now.        Past Surgical History:   Procedure Laterality Date    AMPUTATION, FINGER Left 02/26/2015    Procedure: AMPUTATION, FINGER;  Surgeon: Lujean Rave, MD;  Location: [facility];  Service: Orthopedics;  Laterality: Left;  Amputation left long finger    CARPAL TUNNEL RELEASE Left     05/30/17    CERVICAL FUSION      CIRCUMCISION, > 13 YEARS, ADULT N/A 09/08/2014    Procedure: CIRCUMCISION, > 13 YEARS, ADULT;  Surgeon: Ellwood Handler, MD;  Location: [facility];  Service: Urology;  Laterality: N/A;  CIRCUMCISION    CORONARY ANGIOPLASTY WITH STENT PLACEMENT      2 stents    CORONARY ANGIOPLASTY WITH STENT PLACEMENT  12/13/12    DES to OM1, 2 stents    EGD N/A 09/18/2019    Procedure: EGD;  Surgeon: Salena Saner, MD;  Location: [facility];  Service: Gastroenterology;  Laterality: N/A;    EGD, COLONOSCOPY N/A 12/19/2017    Procedure: EGD, COLONOSCOPY;  Surgeon: Salena Saner, MD;  Location: [facility];  Service: Gastroenterology;  Laterality: N/A;    LUMBAR LAMINECTOMY      RELEASE, CUBITAL TUNNEL Left 05/30/2017       Family History   Problem Relation Age of Onset    Heart disease Mother     Stroke Mother     Diabetes Mother     Heart attack Mother     Heart disease Father     Stroke Father     Heart attack Father     Diabetes Sister        Social  Social History     Tobacco Use    Smoking status: Never Smoker    Smokeless tobacco: Never Used   Substance Use Topics    Alcohol use: Yes     Comment: once a year maybe    Drug use: No       .     Allergies   Allergen Reactions    Gabapentin      "Makes crazy/hyper"    Glucophage [Metformin Hydrochloride] Nausea And Vomiting    Keflex [Cephalexin] Nausea And Vomiting    Morphine      Hallucinations    Victoza [Liraglutide] Nausea And Vomiting       Home Medications             acetaminophen (TYLENOL) 500 MG tablet      Take 1,000 mg by mouth every 4 (four) hours as needed for Pain     atorvastatin (LIPITOR) 80 MG tablet     Take 80 mg by mouth nightly     CALCIUM-MAGNESIUM-ZINC PO     Take 3 tablets by mouth every morning        cetirizine (ZYRTEC) 10 MG tablet     Take 10 mg by mouth every morning        cholestyramine (QUESTRAN) 4 g packet     Take 1 packet by mouth daily as needed (diarrhea)        clopidogrel (PLAVIX) 75 mg tablet     Take 1 tablet (75 mg total) by mouth daily.     Dapagliflozin Propanediol (Farxiga) 10 MG Tab     Take 10 mg by mouth every morning     DULoxetine (CYMBALTA) 60 MG capsule     Take 60 mg by mouth every morning     fluticasone (FLONASE) 50 MCG/ACT nasal spray     2 sprays by Nasal route daily as needed     furosemide (LASIX) 20 MG tablet     Take 20 mg by mouth every morning        hydroCHLOROthiazide (MICROZIDE) 12.5 MG capsule     Take 12.5 mg by mouth every morning     hydrOXYzine (VISTARIL) 50 MG capsule     Take 1 capsule (50 mg total) by mouth nightly.     icosapent ethyl (Vascepa) 1 g capsule     Take 2 g by mouth 2 (two) times daily with meals     Insulin Aspart (INSULIN, PATIENT OWN PUMP,: TOTAL DAILY BASAL UNITS)     Inject 3.5 Units into the skin every 1 hour Novolog        lidocaine (LIDODERM) 5 %     Place 2 patches onto the skin daily as needed (apply tp bottoms of feet) Remove & Discard patch within 12 hours or as directed by MD     metFORMIN (GLUCOPHAGE-XR) 500 MG 24 hr tablet     Take 500 mg by  mouth every morning     metoprolol tartrate (LOPRESSOR) 100 MG tablet     Take 100 mg by mouth 2 (two) times daily     Multiple Vitamin (ONE-A-DAY MENS) Tab     Take 1 tablet by mouth every morning.     nitroglycerin (NITROSTAT) 0.4 MG SL tablet     Place 1 tablet (0.4 mg total) under the tongue every 5 (five) minutes as needed for Chest pain     pregabalin (LYRICA) 50 MG capsule     Take 50 mg by mouth 2 (two) times daily        vitamin B-12 (CYANOCOBALAMIN) 1000 MCG tablet     Take  1,000 mcg by mouth every morning        vitamin D, ergocalciferol, (DRISDOL) 50000 UNIT Cap     Take 50,000 Units by mouth Once each week on Tuesday morning           Review of Systems   Constitutional: Positive for diaphoresis. Negative for chills and fever.   HENT: Negative for trouble swallowing.    Eyes: Positive for visual disturbance.   Respiratory: Positive for cough and shortness of breath.    Cardiovascular: Positive for chest pain, palpitations and leg swelling.   Gastrointestinal: Positive for nausea. Negative for abdominal pain, blood in stool and vomiting.   Musculoskeletal: Positive for back pain and neck pain.   Neurological: Negative for dizziness and headaches.        States that he has neuropathic pain in his arms.   All other systems reviewed and are negative.        Physical Exam    BP: 125/73, Heart Rate: 98, Temp: 98.6 F (37 C), Resp Rate: 22, SpO2: 97 %, Weight: 103.4 kg    Physical Exam  Vitals and nursing note reviewed.   Constitutional:       General: He is not in acute distress.     Appearance: He is well-developed. He is not diaphoretic.   HENT:      Head: Normocephalic and atraumatic.      Right Ear: External ear normal.      Left Ear: External ear normal.   Eyes:      General: No scleral icterus.        Right eye: No discharge.         Left eye: No discharge.      Conjunctiva/sclera: Conjunctivae normal.   Cardiovascular:      Rate and Rhythm: Normal rate and regular rhythm.      Heart sounds: No murmur heard.   No friction rub. No gallop.    Pulmonary:      Effort: Pulmonary effort is normal. No respiratory distress.      Breath sounds: Normal breath sounds. No stridor. No wheezing or rales.      Comments: Frequent coughing in the ED.   Chest:      Chest wall: No tenderness.   Abdominal:      General: Bowel sounds are normal. There is no distension.      Palpations: There is no mass.      Tenderness: There is no abdominal tenderness. There is no guarding or rebound.    Musculoskeletal:         General: No tenderness.      Cervical back: Neck supple.      Comments: Chest pain is not reproduced with palpation of his chest.   Skin:     Coloration: Skin  is not pale.      Findings: No erythema or rash.   Neurological:      Mental Status: He is alert.      Motor: No abnormal muscle tone.      Coordination: Coordination normal.   Psychiatric:         Behavior: Behavior normal.         Thought Content: Thought content normal.           MDM and ED Course     ED Medication Orders (From admission, onward)    Start Ordered     Status Ordering Provider    11/18/19 2028 11/18/19 2027  isosorbide mononitrate (IMDUR) 24 hr tablet 30 mg  Daily     Route: Oral  Ordered Dose: 30 mg     Last MAR action: Given Phineas Mcenroe C    11/18/19 2003 11/18/19 2003  nitroglycerin (NITROSTAT) SL tablet 0.4 mg  Every 5 min PRN     Route: Sublingual  Ordered Dose: 0.4 mg     Last MAR action: Given Tatsuya Okray C             MDM  Number of Diagnoses or Management Options  Chest pain, unspecified type  Cough  Diagnosis management comments: I spoke with Dr. Pearson Grippe, who felt the patient could be discharged on an antianginal such as Imdur.  A repeat troponin was done.  The patient has an appointment tomorrow with Dr. Howie Ill  Patient was comfortable with that.  The patient can return if worsening symptoms.  I reviewed the recent stress test with Dr. Orvis Brill.    The patient presented with CP and is clinically well appearing. Symptoms are not suggestive of pulmonary embolus, cardiac ischemia, aortic dissection, or other serious etiology. These diagnoses have been considered and excluded clinically.  Given the low risk of these diagnoses further testing and evaluation does not appear to be indicated at this time. Diagnostic impression and plan were discussed and agreed upon with the patient and/or family.  Results of lab/radiology tests were reviewed and discussed with the patient and/or family. All questions  were answered and concerns addressed. Chest pain precautions were given. I emphasized the need for close follow-up with the patient's primary care physician, and that should the symptoms worsen or change in anyway that they are to return to the ER immediately for re-evaluation.                     Procedures    Clinical Impression & Disposition     Clinical Impression  Final diagnoses:   Chest pain, unspecified type   Cough        ED Disposition     ED Disposition Condition Date/Time Comment    Discharge  Mon Nov 18, 2019  9:16 PM [name] discharge to home/self care.    Condition at disposition: Stable           Discharge Medication List as of 11/18/2019  9:16 PM      START taking these medications    Details   benzonatate (Tessalon Perles) 100 MG capsule Take 1 capsule (100 mg total) by mouth 3 (three) times daily as needed for Cough Swallow whole; do not chew, Starting Mon 11/18/2019, Normal      isosorbide mononitrate (IMDUR) 30 MG 24 hr tablet Take 1 tablet (30 mg total) by mouth daily, Starting Mon 11/18/2019, Normal  Treatment Team: Scribe: Jacklynn Bue, MD  11/18/19 2226

## 2019-11-18 NOTE — EDIE (Signed)
COLLECTIVE?NOTIFICATION?11/18/2019 16:01?Michael [name]?MRN: [id number]    Criteria Met      High Utilization (6+ED/6 Months)    Security and Safety  No recent Security Events currently on file    ED Care Guidelines  There are currently no ED Care Guidelines for this patient. Please check your facility's medical records system.        Prescription Monitoring Program  000??- Narcotic Use Score  000??- Sedative Use Score  000??- Stimulant Use Score  000??- Overdose Risk Score  - All Scores range from 000-999 with 75% of the population scoring < 200 and on 1% scoring above 650  - The last digit of the narcotic, sedative, and stimulant score indicates the number of active prescriptions of that type  - Higher Use scores correlate with increased prescribers, pharmacies, mg equiv, and overlapping prescriptions  - Higher Overdose Risk Scores correlate with increased risk of unintentional overdose death   Concerning or unexpectedly high scores should prompt a review of the PMP record; this does not constitute checking PMP for prescribing purposes.      E.D. Visit Count (12 mo.)  Facility Visits   [facility] 1   [facility] 2   [facility] 6   Total 9   Note: Visits indicate total known visits.     Recent Emergency Department Visit Summary  Date Facility [facility] Type Diagnoses or Chief Complaint   Nov 18, 2019 [facility] [place] [place] [facility]      Chest Pain      Oct 31, 2019 [facility] [place]      Chest Pain      Chest pain, unspecified      Oct 29, 2019 [facility] [place] Emergency      0. LEG SWELLING      0. Other specified soft tissue disorders      1. Cellulitis of right lower limb      2. Chronic pain syndrome      2. Unspecified osteoarthritis, unspecified site      2. Shortness of breath      2. Edema, unspecified      2. Long term (current) use of insulin      2. Chest pain, unspecified      2. Sleep apnea,  unspecified      Sep 06, 2019 [facility] [place] Emergency      0. CHEST PAIN      Jul 15, 2019 [facility] [place] Emergency      0. Shortness of breath      0. SOB NAUSEA EMS      1. Unspecified abdominal pain      2. Atherosclerotic heart disease of native coronary artery without angina pectoris      2. Presence of coronary angioplasty implant and graft      2. Other long term (current) drug therapy      2. Type 2 diabetes mellitus with hyperglycemia      2. Long term (current) use of oral hypoglycemic drugs      2. Old myocardial infarction      2. Personal history of nicotine dependence      Jul 13, 2019 [facility] [place]      0. HIGH SUGAR      0. Hyperglycemia, unspecified      1. Type 2 diabetes mellitus with hyperglycemia  2. Old myocardial infarction      2. Coronary angioplasty status      2. Personal history of Methicillin resistant Staphylococcus aureus infection      2. Polyneuropathy, unspecified      2. Atherosclerotic heart disease of native coronary artery without angina pectoris      2. Other chronic pain      2. Osteomyelitis, unspecified      Jun 12, 2019 [facility] [place] Emergency      0. Unspecified abdominal pain      1. Diarrhea, unspecified      2. Pure hypercholesterolemia, unspecified      2. Long term (current) use of oral hypoglycemic drugs      2. Presence of coronary angioplasty implant and graft      2. Type 2 diabetes mellitus without complications      2. Nausea      2. CONTACT WITH AND (SUSPECTED) EXPOSURE TO COVID-19      2. Hypertensive heart disease without heart failure      2. Old myocardial infarction      May 02, 2019 [facility] [place] Emergency      0. Pain in unspecified foot      1. Cellulitis of left lower limb      2. Personal history of Methicillin resistant Staphylococcus aureus infection      2. Puncture wound with foreign body, left foot, initial encounter      2. Hyperlipidemia, unspecified      2.  Atherosclerotic heart disease of native coronary artery without angina pectoris      2. Polyneuropathy, unspecified      2. Osteomyelitis, unspecified      2. Chronic pain syndrome      2. Gastro-esophageal reflux disease without esophagitis      Dec 26, 2018 [facility] [place] [place] [facility]      CHEST PAIN/EMS      CHEST PAIN ADULT      Non-ST elevation (NSTEMI) myocardial infarction      Chest pain, unspecified          Recent Inpatient Visit Summary  Date Facility [facility] Type Diagnoses or Chief Complaint   Jul 16, 2019 [facility] [place] [place] Inpatient      0. NAUSEA VOMITING UNCONTROLLED DIABETES      0. Nausea with vomiting, unspecified      1. Other cholelithiasis without obstruction      2. Other chronic pain      2. Gastro-esophageal reflux disease without esophagitis      2. Long term (current) use of inhaled steroids      2. Type 2 diabetes mellitus without complications      2. Presence of aortocoronary bypass graft      2. Presence of insulin pump (external) (internal)      2. Hyperlipidemia, unspecified      Dec 26, 2018 [facility] [place]      NSTEMI      Non-ST elevation (NSTEMI) myocardial infarction      Chest pain, unspecified      CHEST PAIN ADULT      1. Myocardial infarction type 2      3. Dehydration      4. Noninfective gastroenteritis and colitis, unspecified      5. Essential (primary) hypertension      6. Old myocardial infarction  7. Atherosclerotic heart disease of native coronary artery without angina pectoris          Care Team  Provider Specialty Phone Fax Service Dates   [place], [place] , D.O. Internal Medicine [phone] Current    [facility] [place] Case Manager/Care Coordinator   Current    [facility] Case Manager/Care Coordinator   Current    [facility]   Current    [facility] Other   Current    [place], [facility] Case or Care Manager [phone]   Current      Collective Portal  This patient has registered at the [facility] Emergency Department   For more information visit: [email or web address]     PLEASE NOTE:     1.   Any care recommendations and other clinical information are provided as guidelines or for historical purposes only, and providers should exercise their own clinical judgment when providing care.    2.   You may only use this information for purposes of treatment, payment or health care operations activities, and subject to the limitations of applicable [employer] Policies.    3.   You should consult directly with the organization that provided a care guideline or other clinical history with any questions about additional information or accuracy or completeness of information provided.    ? 2021 [place], [employer] - [email or web address]

## 2019-11-18 NOTE — Discharge Instructions (Signed)
Uncertain Causes of Chest Pain    Chest pain can happen for a number of reasons. Sometimes the cause can't be determined. If your condition does not seem serious, and your pain does not appear to be coming from your heart, your healthcare provider may recommend watching it closely. Sometimes the signs of a serious problem take more time to appear. Many problems not related to your heart can cause chest pain. These include:  · Musculoskeletal. Costochondritis is an inflammation of the tissues around the ribs that can occur from trauma or overuse injuries, or a strain of the muscles of the chest wall  · Respiratory. Pneumonia, collapsed lung (pneumothorax), or inflammation of the lining of the chest and lungs (pleurisy)  · Gastrointestinal. Esophageal reflux, heartburn, ulcers, or gallbladder disease  · Anxiety and panic disorders  · Nerve compression and inflammation  · Rare miscellaneous problems such as aortic aneurysm (a swelling of the large artery coming out of the heart) or pulmonary embolism (a blood clot in the lungs)  Home care  After your visit, follow these recommendations:  · Rest today and avoid strenuous activity.  · Take any prescribed medicine as directed.  · Be aware of any recurrent chest pain and notice any changes  Follow-up care  Follow up with your healthcare provider if you do not start to feel better within 24 hours, or as advised.  Call 911  Call 911 if any of these occur:  · A change in the type of pain: if it feels different, becomes more severe, lasts longer, or begins to spread into your shoulder, arm, neck, jaw or back  · Shortness of breath or increased pain with breathing  · Weakness, dizziness, or fainting  · Rapid heart beat  · Crushing sensation in your chest  When to seek medical advice  Call your healthcare provider right away if any of the following occur:  · Cough with dark colored sputum (phlegm) or blood  · Fever of 100.4ºF (38ºC) or higher, or as directed by your healthcare  provider  · Swelling, pain or redness in one leg  [employer] last reviewed this educational content on 08/16/2016  © 2000-2021 [employer]. All rights reserved. This information is not intended as a substitute for professional medical care. Always follow your healthcare professional's instructions.

## 2019-11-19 ENCOUNTER — Other Ambulatory Visit: Payer: Self-pay

## 2019-11-19 ENCOUNTER — Encounter (INDEPENDENT_AMBULATORY_CARE_PROVIDER_SITE_OTHER): Payer: Self-pay | Admitting: Cardiovascular Disease

## 2019-11-19 ENCOUNTER — Ambulatory Visit (INDEPENDENT_AMBULATORY_CARE_PROVIDER_SITE_OTHER): Payer: Medicare PPO | Admitting: Cardiovascular Disease

## 2019-11-19 ENCOUNTER — Other Ambulatory Visit: Payer: Self-pay | Admitting: Emergency Medicine

## 2019-11-19 VITALS — BP 102/63 | HR 78 | Ht 68.0 in | Wt 229.0 lb

## 2019-11-19 DIAGNOSIS — I10 Essential (primary) hypertension: Secondary | ICD-10-CM

## 2019-11-19 DIAGNOSIS — I517 Cardiomegaly: Secondary | ICD-10-CM

## 2019-11-19 DIAGNOSIS — I2510 Atherosclerotic heart disease of native coronary artery without angina pectoris: Secondary | ICD-10-CM

## 2019-11-19 DIAGNOSIS — E785 Hyperlipidemia, unspecified: Secondary | ICD-10-CM

## 2019-11-19 DIAGNOSIS — I679 Cerebrovascular disease, unspecified: Secondary | ICD-10-CM

## 2019-11-19 MED ORDER — ISOSORBIDE MONONITRATE ER 30 MG TABLET,EXTENDED RELEASE 24 HR
30.00 mg | ORAL_TABLET | ORAL | Status: DC
Start: 2019-11-19 — End: 2019-11-19

## 2019-11-19 MED ORDER — NITROGLYCERIN 0.4 MG SUBLINGUAL TABLET
0.40 mg | SUBLINGUAL_TABLET | SUBLINGUAL | Status: DC
Start: ? — End: 2019-11-19

## 2019-11-20 ENCOUNTER — Telehealth (INDEPENDENT_AMBULATORY_CARE_PROVIDER_SITE_OTHER): Payer: Self-pay | Admitting: PHYSICAL MEDICINE AND REHABILITATION

## 2019-11-20 NOTE — Telephone Encounter (Signed)
[name] left a message about canceling his procedure due to not being cleared by his cardiologist. I called Dr.Jhawar's office and asked them if something changed at [name]'s visit yesterday because we already had clearance. They stated that he is still okay to have procedure on recommended terms. After speaking with [name] and relaying the information, he decided to hold off on injection until he follows up with cardiology due to possible heart cath at that time. He will call back if he wants to reschedule.

## 2019-12-02 ENCOUNTER — Inpatient Hospital Stay (HOSPITAL_BASED_OUTPATIENT_CLINIC_OR_DEPARTMENT_OTHER)
Admission: RE | Admit: 2019-12-02 | Payer: Medicare PPO | Source: Ambulatory Visit | Admitting: PHYSICAL MEDICINE AND REHABILITATION

## 2019-12-02 ENCOUNTER — Encounter (HOSPITAL_BASED_OUTPATIENT_CLINIC_OR_DEPARTMENT_OTHER): Admission: RE | Payer: Self-pay | Source: Ambulatory Visit

## 2019-12-02 SURGERY — LUMBOSACRAL SELECTIVE NERVE ROOT BLOCK
Laterality: Left

## 2019-12-05 ENCOUNTER — Encounter (INDEPENDENT_AMBULATORY_CARE_PROVIDER_SITE_OTHER): Payer: Self-pay
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
## 2019-12-17 ENCOUNTER — Other Ambulatory Visit: Payer: Self-pay

## 2019-12-17 ENCOUNTER — Ambulatory Visit (INDEPENDENT_AMBULATORY_CARE_PROVIDER_SITE_OTHER): Payer: Medicare PPO | Admitting: Cardiovascular Disease

## 2019-12-17 ENCOUNTER — Encounter (INDEPENDENT_AMBULATORY_CARE_PROVIDER_SITE_OTHER): Payer: Self-pay | Admitting: Cardiovascular Disease

## 2019-12-17 VITALS — BP 106/63 | HR 81 | Resp 18 | Ht 68.0 in | Wt 226.0 lb

## 2019-12-17 DIAGNOSIS — I679 Cerebrovascular disease, unspecified: Secondary | ICD-10-CM

## 2019-12-17 DIAGNOSIS — E785 Hyperlipidemia, unspecified: Secondary | ICD-10-CM

## 2019-12-17 DIAGNOSIS — I517 Cardiomegaly: Secondary | ICD-10-CM

## 2019-12-17 DIAGNOSIS — I10 Essential (primary) hypertension: Secondary | ICD-10-CM

## 2019-12-17 DIAGNOSIS — I2510 Atherosclerotic heart disease of native coronary artery without angina pectoris: Secondary | ICD-10-CM

## 2019-12-17 MED ORDER — ISOSORBIDE MONONITRATE ER 30 MG TABLET,EXTENDED RELEASE 24 HR
30.0000 mg | ORAL_TABLET | Freq: Every morning | ORAL | 1 refills | Status: DC
Start: 2019-12-17 — End: 2020-06-26

## 2019-12-24 ENCOUNTER — Encounter (INDEPENDENT_AMBULATORY_CARE_PROVIDER_SITE_OTHER): Payer: Medicare PPO

## 2019-12-24 DIAGNOSIS — Z4509 Encounter for adjustment and management of other cardiac device: Secondary | ICD-10-CM

## 2019-12-24 DIAGNOSIS — Z95818 Presence of other cardiac implants and grafts: Secondary | ICD-10-CM

## 2019-12-30 ENCOUNTER — Encounter (INDEPENDENT_AMBULATORY_CARE_PROVIDER_SITE_OTHER): Payer: Self-pay

## 2019-12-30 ENCOUNTER — Other Ambulatory Visit: Payer: Self-pay

## 2019-12-30 NOTE — Procedures (Signed)
CARDIOLOGY [facility] HEART & VASCULAR [place], [facility]  [name]  [place] [address]  [phone]       Date: 12/24/2019  Patient Name: Edwin [name]  MRN#: [id number]  DOB: [birth year]    Remote ILR Device Interrogation    Model/SN Medtronic [id number] S  Implant Date Sep 14, 2017  Loop Recorder    Patient's ILR was checked by remote access.     31 day analysis was performed of patient's implantable loop recorder, including analysis of programmed parameters, battery, heart rhythm, counters, any observation summaries, and alerts.  Please see programmer printout for further details (scanned document).    Battery     OK    Events/Episodes     None    Diagnostic Observations     No new observations.      Follow up in 1 month, remote.

## 2020-01-15 NOTE — Procedures (Signed)
CARDIOLOGY [facility] HEART & VASCULAR [place], [facility]  [name]  [place] [address]  [phone]       Date: 08/22/2019  Patient Name: Edwin [name]  MRN#: [id number]  DOB: 05-07-1955    Remote ILR Device Interrogation    Model/SN Medtronic [id number] / [id number] S  Implant Date Sep 14, 2017  Loop Recorder    Patient's ILR was checked by remote access.     31 day analysis was performed of patient's implantable loop recorder, including analysis of programmed parameters, battery, heart rhythm, counters, any observation summaries, and alerts.  Please see programmer printout for further details (scanned document).    Battery                                                                                         OK    Events/Episodes     1 tachy episode. See attached EGM.    Diagnostic Observations     No new observations.      Follow up in 1 month, remote.

## 2020-01-15 NOTE — Procedures (Signed)
CARDIOLOGY [facility] HEART & VASCULAR [place], [facility]  [name]  [place] [address]  [phone]       Date: 09/22/2019  Patient Name: Edwin [name]  MRN#: [id number]  DOB: 06/11/1955    Remote ILR Device Interrogation    Model/SN Medtronic [id number] / [id number] S  Implant Date Sep 14, 2017  Loop Recorder    Patient's ILR was checked by remote access.     31 day analysis was performed of patient's implantable loop recorder, including analysis of programmed parameters, battery, heart rhythm, counters, any observation summaries, and alerts.  Please see programmer printout for further details (scanned document).    Battery                                                                                         OK    Events/Episodes     1 tachy episode. See attached EGM.    Diagnostic Observations     No new observations.      Follow up in 1 month, remote.

## 2020-01-24 ENCOUNTER — Encounter (INDEPENDENT_AMBULATORY_CARE_PROVIDER_SITE_OTHER): Payer: Medicare PPO

## 2020-01-24 DIAGNOSIS — Z4509 Encounter for adjustment and management of other cardiac device: Secondary | ICD-10-CM

## 2020-01-24 DIAGNOSIS — Z95818 Presence of other cardiac implants and grafts: Secondary | ICD-10-CM

## 2020-01-30 ENCOUNTER — Encounter (INDEPENDENT_AMBULATORY_CARE_PROVIDER_SITE_OTHER): Payer: Self-pay

## 2020-02-04 ENCOUNTER — Ambulatory Visit (RURAL_HEALTH_CENTER): Payer: Self-pay | Admitting: Primary Podiatric Medicine

## 2020-02-04 ENCOUNTER — Telehealth (RURAL_HEALTH_CENTER): Payer: Self-pay

## 2020-02-04 ENCOUNTER — Encounter (RURAL_HEALTH_CENTER): Payer: Self-pay

## 2020-02-04 NOTE — Telephone Encounter (Signed)
Patient has jury duty the week that he is scheduled for an appointment with you, they have stated that he is having leg swelling and purple in coloration. Would you be willing to write an excuse for this patient or could we get him in sooner to see you?

## 2020-02-06 ENCOUNTER — Encounter (RURAL_HEALTH_CENTER): Payer: Self-pay | Admitting: Primary Podiatric Medicine

## 2020-02-06 ENCOUNTER — Ambulatory Visit
Payer: No Typology Code available for payment source | Attending: Primary Podiatric Medicine | Admitting: Primary Podiatric Medicine

## 2020-02-06 VITALS — HR 90 | Resp 18 | Wt 236.6 lb

## 2020-02-06 DIAGNOSIS — M79671 Pain in right foot: Secondary | ICD-10-CM

## 2020-02-06 DIAGNOSIS — M79672 Pain in left foot: Secondary | ICD-10-CM

## 2020-02-06 DIAGNOSIS — M21621 Bunionette of right foot: Secondary | ICD-10-CM

## 2020-02-06 DIAGNOSIS — L851 Acquired keratosis [keratoderma] palmaris et plantaris: Secondary | ICD-10-CM

## 2020-02-06 DIAGNOSIS — L600 Ingrowing nail: Secondary | ICD-10-CM

## 2020-02-06 DIAGNOSIS — M21622 Bunionette of left foot: Secondary | ICD-10-CM

## 2020-02-06 DIAGNOSIS — E1142 Type 2 diabetes mellitus with diabetic polyneuropathy: Secondary | ICD-10-CM

## 2020-02-06 DIAGNOSIS — M21372 Foot drop, left foot: Secondary | ICD-10-CM

## 2020-02-06 DIAGNOSIS — B351 Tinea unguium: Secondary | ICD-10-CM

## 2020-02-06 NOTE — Progress Notes (Signed)
Subjective:    Patient ID: Michael [name] is a 63 y.o. male.    With a history of TIA diabetes and spinal stenosis who is seen for exam and treatment with lower extremity.  Last A1c was 8.1 and his blood glucose has been trending downward recently.  Denies any open lesions or drainage continues with left-sided a dropfoot with continued bracing.  Complains of dystrophic at tender nails which are difficult for him to manage.  Denies any open lesions or drainage today and is resting comfortably in no apparent distress.      The following portions of the patient's history were reviewed and updated as appropriate: allergies, current medications, past family history, past medical history, past social history, past surgical history and problem list.    Review of Systems   Constitutional: Negative.    Respiratory: Negative.    Cardiovascular: Positive for leg swelling.   Musculoskeletal: Negative.    Skin: Negative.    Allergic/Immunologic: Negative.    Neurological: Positive for numbness.   Hematological: Negative.    All other systems reviewed and are negative.    Pulse 90, resp. rate 18, weight 107.3 kg (236 lb 9.6 oz), SpO2 96 %.          Objective:    Physical Exam  Patient with warm dry supple skin over this lower extremity.  Patient with nails that are thick dystrophic discolored and elongated x9.  This left great toenail was previously removed.  Patient does have relief with the debridement.  Patient with diffuse hyperkeratotic lesion at this right fifth metatarsal head.  Patient with decreased pulses with mild edema but without ischemic changes or claudication.  Patient with decreased protective sensation left greater than right with monofilament.  Patient with decreased dorsiflexion strength on the left side.      Assessment:       1. Diabetic peripheral neuropathy associated with type 2 diabetes mellitus    2. Onychomycosis    3. Ingrown nail    4. Foot drop, left foot    5. Pain in both feet    6. Tailor's  bunion of both feet          Plan:       After consent we did debride the nails x9 without incident taking care remove this thickness and incurvation as needed.  We also did debride this hyperkeratotic lesion on this right fifth metatarsal head.  Discussed the import of continued diabetic shoes and regular foot exams as well as blood glucose control.

## 2020-02-10 ENCOUNTER — Other Ambulatory Visit (HOSPITAL_COMMUNITY): Payer: Self-pay

## 2020-02-10 LAB — EXTERNAL COVID-19 MOLECULAR RESULT: External 2019-n-CoV/SARS-CoV-2: POSITIVE — AB

## 2020-02-24 ENCOUNTER — Encounter (INDEPENDENT_AMBULATORY_CARE_PROVIDER_SITE_OTHER): Payer: Medicare PPO

## 2020-02-24 DIAGNOSIS — Z95818 Presence of other cardiac implants and grafts: Secondary | ICD-10-CM

## 2020-02-24 DIAGNOSIS — Z4509 Encounter for adjustment and management of other cardiac device: Secondary | ICD-10-CM

## 2020-03-01 ENCOUNTER — Encounter (INDEPENDENT_AMBULATORY_CARE_PROVIDER_SITE_OTHER): Payer: Self-pay

## 2020-03-10 ENCOUNTER — Other Ambulatory Visit: Payer: Self-pay

## 2020-03-10 NOTE — Procedures (Signed)
CARDIOLOGY [facility] HEART & VASCULAR [place], [facility]  [name]  [place] [address]  [phone]       Date: 01/24/2020  Patient Name: Edwin [name]  MRN#: [id number]  DOB: [birth year]    Remote ILR Device Interrogation    Model/SN Medtronic [id number] S  Implant Date Sep 14, 2017  Loop Recorder    Patient's ILR was checked by remote access.     31 day analysis was performed of patient's implantable loop recorder, including analysis of programmed parameters, battery, heart rhythm, counters, any observation summaries, and alerts.  Please see programmer printout for further details (scanned document).    Battery     OK    Events/Episodes     None    Diagnostic Observations     No new observations.      Follow up in 1 month, remote.

## 2020-03-26 ENCOUNTER — Encounter (INDEPENDENT_AMBULATORY_CARE_PROVIDER_SITE_OTHER): Payer: Medicare PPO

## 2020-03-26 DIAGNOSIS — Z4509 Encounter for adjustment and management of other cardiac device: Secondary | ICD-10-CM

## 2020-04-01 ENCOUNTER — Encounter (INDEPENDENT_AMBULATORY_CARE_PROVIDER_SITE_OTHER): Payer: Self-pay

## 2020-04-02 ENCOUNTER — Ambulatory Visit
Payer: No Typology Code available for payment source | Attending: Primary Podiatric Medicine | Admitting: Primary Podiatric Medicine

## 2020-04-02 ENCOUNTER — Encounter (RURAL_HEALTH_CENTER): Payer: Self-pay | Admitting: Primary Podiatric Medicine

## 2020-04-02 VITALS — HR 80 | Temp 97.7°F | Resp 20 | Wt 240.0 lb

## 2020-04-02 DIAGNOSIS — M79671 Pain in right foot: Secondary | ICD-10-CM

## 2020-04-02 DIAGNOSIS — M21621 Bunionette of right foot: Secondary | ICD-10-CM

## 2020-04-02 DIAGNOSIS — L851 Acquired keratosis [keratoderma] palmaris et plantaris: Secondary | ICD-10-CM

## 2020-04-02 DIAGNOSIS — M21372 Foot drop, left foot: Secondary | ICD-10-CM

## 2020-04-02 DIAGNOSIS — L600 Ingrowing nail: Secondary | ICD-10-CM

## 2020-04-02 DIAGNOSIS — M79672 Pain in left foot: Secondary | ICD-10-CM

## 2020-04-02 DIAGNOSIS — E1142 Type 2 diabetes mellitus with diabetic polyneuropathy: Secondary | ICD-10-CM

## 2020-04-02 DIAGNOSIS — B351 Tinea unguium: Secondary | ICD-10-CM

## 2020-04-02 NOTE — Progress Notes (Signed)
Subjective:    Patient ID: Michael [name] is a 64 y.o. male.    With a history of diabetes CAD TIA and spinal stenosis who is seen for exam and treatment of his lower extremity.  Just underwent release of Dupuytren's contracture in his left hand.  His last A1c was 8.1 in July of this year.  Patient denies any open lesions or drainage.  Is not wearing his AFO on today.  Patient with complaint of dystrophic tender nails over this lower extremity and hyperkeratotic buildup on the right fifth metatarsal head.      The following portions of the patient's history were reviewed and updated as appropriate: allergies, current medications, past family history, past medical history, past social history, past surgical history and problem list.    Review of Systems   Constitutional: Negative.    Respiratory: Negative.    Cardiovascular: Negative.    Musculoskeletal: Negative.    Skin: Negative.    Allergic/Immunologic: Negative.    Neurological: Positive for numbness.   Hematological: Negative.    All other systems reviewed and are negative.    Pulse 80, temperature 97.7 F (36.5 C), resp. rate 20, weight 108.9 kg (240 lb), SpO2 96 %.          Objective:    Physical Exam  Patient with warm dry supple skin over this lower extremity.  Patient with nails that are thick dystrophic discolored and elongated x10.  Patient with hyperkeratotic lesion diffusely subfifth which is closed with debridement.  Patient with decreased palpable pulses without ischemic changes or claudication does have venous insufficiency with lower extremity lymphedema today.  Patient with altered sensation distally with the numbness and burning in the 5feet with monofilament.  Patient with prominence of fifth metatarsal head bilaterally.      Assessment:       1. Diabetic peripheral neuropathy associated with type 2 diabetes mellitus    2. Onychomycosis    3. Ingrown nail    4. Foot drop, left foot    5. Pain in both feet    6. Tailor's bunion of both feet     7. Acquired keratoderma          Plan:       After consent we did debride the nails x10 without incident sharply debride this hyperkeratotic lesion on his plantar right foot with a #15 blade.  Discussed the import of blood glucose control and regular foot exams and advised to call with new concerns or complaints over this lower extremity.

## 2020-04-12 ENCOUNTER — Other Ambulatory Visit (HOSPITAL_COMMUNITY): Payer: Self-pay

## 2020-04-12 LAB — EXTERNAL COVID-19 MOLECULAR RESULT: External 2019-n-CoV/SARS-CoV-2: POSITIVE — AB

## 2020-04-21 NOTE — Procedures (Signed)
CARDIOLOGY [facility] HEART & VASCULAR [place], [facility]  [name]  [place] [address]  [phone]       Date: 02/24/2020  Patient Name: ANIKIN PROSSER  MRN#: [id number]  DOB: 15-Aug-1955    Remote ILR Device Interrogation    Model/SN Medtronic [id number] S  Implant Date Sep 14, 2017  Loop Recorder    Patient's ILR was checked by remote access.     31 day analysis was performed of patient's implantable loop recorder, including analysis of programmed parameters, battery, heart rhythm, counters, any observation summaries, and alerts.  Please see programmer printout for further details (scanned document).    Battery     OK    Events/Episodes     None    Diagnostic Observations     No new observations.      Follow up in 1 month, remote.

## 2020-04-26 ENCOUNTER — Encounter (INDEPENDENT_AMBULATORY_CARE_PROVIDER_SITE_OTHER): Payer: Medicare PPO

## 2020-04-26 DIAGNOSIS — Z4509 Encounter for adjustment and management of other cardiac device: Secondary | ICD-10-CM

## 2020-04-26 DIAGNOSIS — Z95818 Presence of other cardiac implants and grafts: Secondary | ICD-10-CM

## 2020-04-29 ENCOUNTER — Other Ambulatory Visit: Payer: Self-pay

## 2020-04-29 NOTE — Procedures (Signed)
CARDIOLOGY [facility] HEART & VASCULAR [place], [facility]  [name]  [place] [address]  [phone]       Date: 04/26/2020  Patient Name: Edwin [name]  MRN#: [id number]  DOB: 06-Sep-1955    Remote ILR Device Interrogation    Model/SN Medtronic [id number] S  Implant Date Sep 14, 2017  Loop Recorder    Patient's ILR was checked by remote access.     31 day analysis was performed of patient's implantable loop recorder, including analysis of programmed parameters, battery, heart rhythm, counters, any observation summaries, and alerts.  Please see programmer printout for further details (scanned document).    Battery     OK    Events/Episodes     None    Diagnostic Observations     No new observations.      Follow up in 1 month, remote.

## 2020-05-02 ENCOUNTER — Encounter (INDEPENDENT_AMBULATORY_CARE_PROVIDER_SITE_OTHER): Payer: Self-pay

## 2020-05-04 ENCOUNTER — Ambulatory Visit (HOSPITAL_BASED_OUTPATIENT_CLINIC_OR_DEPARTMENT_OTHER): Payer: No Typology Code available for payment source | Admitting: Anesthesiology

## 2020-05-04 ENCOUNTER — Encounter (HOSPITAL_BASED_OUTPATIENT_CLINIC_OR_DEPARTMENT_OTHER)
Admission: RE | Disposition: A | Discharge status: Home / Self Care | Payer: Self-pay | Source: Ambulatory Visit | Attending: Gastroenterology

## 2020-05-04 ENCOUNTER — Encounter (HOSPITAL_BASED_OUTPATIENT_CLINIC_OR_DEPARTMENT_OTHER): Payer: Self-pay | Admitting: Gastroenterology

## 2020-05-04 ENCOUNTER — Ambulatory Visit
Admission: RE | Admit: 2020-05-04 | Discharge: 2020-05-04 | Disposition: A | Discharge status: Home / Self Care | Payer: No Typology Code available for payment source | Source: Ambulatory Visit | Attending: Gastroenterology | Admitting: Gastroenterology

## 2020-05-04 DIAGNOSIS — I25110 Atherosclerotic heart disease of native coronary artery with unstable angina pectoris: Secondary | ICD-10-CM | POA: Insufficient documentation

## 2020-05-04 DIAGNOSIS — E1140 Type 2 diabetes mellitus with diabetic neuropathy, unspecified: Secondary | ICD-10-CM | POA: Insufficient documentation

## 2020-05-04 DIAGNOSIS — Z8673 Personal history of transient ischemic attack (TIA), and cerebral infarction without residual deficits: Secondary | ICD-10-CM | POA: Insufficient documentation

## 2020-05-04 DIAGNOSIS — R1310 Dysphagia, unspecified: Secondary | ICD-10-CM | POA: Insufficient documentation

## 2020-05-04 DIAGNOSIS — K317 Polyp of stomach and duodenum: Secondary | ICD-10-CM | POA: Insufficient documentation

## 2020-05-04 DIAGNOSIS — I10 Essential (primary) hypertension: Secondary | ICD-10-CM | POA: Insufficient documentation

## 2020-05-04 HISTORY — DX: Diarrhea, unspecified: R19.7

## 2020-05-04 HISTORY — PX: EGD: SHX3789

## 2020-05-04 HISTORY — DX: Cardiac arrhythmia, unspecified: I49.9

## 2020-05-04 SURGERY — DONT USE, USE 1095-ESOPHAGOGASTRODUODENOSCOPY (EGD), DIAGNOSTIC
Anesthesia: Anesthesia MAC / Sedation | Site: Abdomen | Wound class: Clean Contaminated

## 2020-05-04 MED ORDER — PROPOFOL 200 MG/20ML IV EMUL
INTRAVENOUS | Status: DC | PRN
Start: 2020-05-04 — End: 2020-05-04
  Administered 2020-05-04: 60 mg via INTRAVENOUS
  Administered 2020-05-04: 140 mg via INTRAVENOUS

## 2020-05-04 MED ORDER — SODIUM CHLORIDE (PF) 0.9 % IJ SOLN
3.0000 mL | Freq: Two times a day (BID) | INTRAMUSCULAR | Status: DC
Start: 2020-05-04 — End: 2020-05-04

## 2020-05-04 MED ORDER — SODIUM CHLORIDE 0.9 % IV SOLN
INTRAVENOUS | Status: DC
Start: 2020-05-04 — End: 2020-05-04

## 2020-05-04 MED ORDER — ONDANSETRON HCL 4 MG/2ML IJ SOLN
4.0000 mg | Freq: Once | INTRAMUSCULAR | Status: DC | PRN
Start: 2020-05-04 — End: 2020-05-04

## 2020-05-04 MED ORDER — PROPOFOL 200 MG/20ML IV EMUL
INTRAVENOUS | Status: AC
Start: 2020-05-04 — End: ?
  Filled 2020-05-04: qty 20

## 2020-05-04 MED ORDER — ALBUTEROL SULFATE (2.5 MG/3ML) 0.083% IN NEBU
2.5000 mg | INHALATION_SOLUTION | RESPIRATORY_TRACT | Status: DC | PRN
Start: 2020-05-04 — End: 2020-05-04

## 2020-05-04 MED ORDER — ONDANSETRON 4 MG PO TBDP
4.0000 mg | ORAL_TABLET | Freq: Once | ORAL | Status: DC | PRN
Start: 2020-05-04 — End: 2020-05-04

## 2020-05-04 SURGICAL SUPPLY — 69 items
APOLLO TISSUE HELIX (Supply) IMPLANT
BASKET MED RETRIEV HEX 45X15 (Supply) IMPLANT
BRUSH CLEANING COMBINATION (Supply) ×1
BRUSH HEDGEHOG DUAL END (Supply) ×1 IMPLANT
CAP SCRAPING MED (Supply) IMPLANT
CAPSULE BRAVO (Supply) IMPLANT
CATH BALLOON DILATATION 5837 (Supply) IMPLANT
CATH BARRX 360 RFA EXPRESS (Supply) IMPLANT
CATH BARRX 60 RFA FOCA (Supply) IMPLANT
CATH BARRX 90 RFA FOCAL (Supply) IMPLANT
CATH BARRX ULTRA-LONG RFA FOCA (Supply) IMPLANT
CATH CROSPON ENDO FLIP (Supply) IMPLANT
CATH GLD PROBE BICAP 7FX210CM (Supply) IMPLANT
CATH GLD PROBE BICAP 7FX300CM (Supply) IMPLANT
CATH GOLD PROBE 10FR (Supply) IMPLANT
CATH GOLD PROBE INJECTION 10F (Supply) IMPLANT
CLEANER ENZYMATIC BEDSIDE (Supply) ×2 IMPLANT
CLIP LIGATING RES 360 ULT 17MM (Supply) IMPLANT
CLIP RESOLUTION 360 (Supply) IMPLANT
CONNECTOR QUICK PORT (Supply) ×2 IMPLANT
CRE BALLOON 10-12 DILATOR 5835 (Supply) IMPLANT
CRE BALLOON 12-15 DILATOR 5836 (Supply) IMPLANT
CRE BALLOON 18-20 DILATOR 5838 (Supply) IMPLANT
CRE BALLOON 6-8 DILAT 5833 (Supply) IMPLANT
CRE BALLOON 8-10 DILAT 5834 (Supply) IMPLANT
CRE ESOPH PYLORIC COL 10-12 (Supply) IMPLANT
CRE ESOPH PYLORIC COL 12-15 (Supply) IMPLANT
CRE ESOPH PYLORIC COL 15-18 (Supply) IMPLANT
CRE ESOPH PYLORIC COL 6-8 (Supply) IMPLANT
CRE ESOPH PYLORIC COL 8-10 (Supply) IMPLANT
CRE PYLORIC COL 10-12 DIL 5847 (Supply) IMPLANT
CRE PYLORIC COL 12-15 DIL 5848 (Supply) IMPLANT
CRE PYLORIC COL 15-18 DIL 5849 (Supply) IMPLANT
CRE PYLORIC COL 8-10 DIL 5846 (Supply) IMPLANT
DEVICE GRASP RAPTOR 2.4X160 (Supply) IMPLANT
DEVICE TALON GRASP 2.4X160 (Supply) IMPLANT
DIL CRE 18-20 PYL CLN 5850 (Supply) IMPLANT
DIL CRE 18-20 PYL CLN BIL 5871 (Supply) IMPLANT
DREAMWIRE STR .035 450CM (Supply) IMPLANT
FORCEP BIOPSY HOT RADIAL JAW 4 (Supply) IMPLANT
FORCEP BIOPSY RAD JAW 1333-40 (Supply) IMPLANT
FORCEP RAT TOOTH GRASP 2.4 (Supply) IMPLANT
FORCEP RESCUE RAT/ALL 8X230 (Supply) IMPLANT
GUIDEWIRE .038 (Supply) IMPLANT
HEMOSTAT ENDO HEMOSPRAY 5G (Supply) IMPLANT
HEMOSTAT ENDO HEMOSPRAY 7FR (Supply)
KIT PEG 18 FR (Supply) IMPLANT
KIT STD PEG 20FR PULL (Supply) IMPLANT
MARKER ENDOSCOPIC SPOT (Supply) IMPLANT
NDL INTERJECT SCLERO 25G (Supply) IMPLANT
OVERTUBE GUARD ESOPH 10.0-17.9 (Supply) IMPLANT
OVERTUBE GUARD ESOPH 8.6-10.00 (Supply) IMPLANT
PAD CLINCH ENDO TRASPORT (Supply) ×2 IMPLANT
PEG TUBE 18F GASTRO ENDOV (Supply) IMPLANT
PROBE CIRCUMFRENTIAL (Supply) IMPLANT
PROBE SIDE FIRE (Supply) IMPLANT
PROBE STRAIGHT FIRE APC (Supply) IMPLANT
RESCUENET RETRIEVAL  2.5X230 (Supply) ×2 IMPLANT
SCISSOR ENZIZOR 2.6MMX236CM (Supply) IMPLANT
SNARE CAPTIVATOR ERM (Supply) IMPLANT
SNARE ENDO CAP2 RND 10MM LOOP (Supply) ×2 IMPLANT
SPEEDBAND (Supply) IMPLANT
SUT APOLLO 2.0 (Supply) IMPLANT
SUT APOLLO OVERSTITCH CINCH (Supply) IMPLANT
SYS APOLLO OVERSTITCH SUTURE (Supply) IMPLANT
SYS APOLLO OVERTUBE ACCESS (Supply) IMPLANT
SYSTEM INFLATION ALLIANCE II (Supply) IMPLANT
VALVE DISP CLEANING BIOGUARD (Supply) ×2 IMPLANT
VALVE OLYMPUS DISP A/W/S/ BIO (Supply) ×2 IMPLANT

## 2020-05-04 NOTE — Transfer of Care (Signed)
Anesthesia Transfer of Care Note    Patient: Michael [name]    Last vitals:   Vitals:    05/04/20 0855   BP: 128/79   Pulse: 85   Resp: 16   Temp: 37 C (98.6 F)   SpO2: 94%       Oxygen: Nasal Cannula     Mental Status:awake and alert     Airway: Natural    Cardiovascular Status:  stable

## 2020-05-04 NOTE — Anesthesia Postprocedure Evaluation (Signed)
Anesthesia Post Evaluation    Patient: Michael [name]    Procedure(s):  EGD    Anesthesia type: MAC    Last Vitals:   Vitals Value Taken Time   BP 128/79 05/04/20 0855   Temp 37 C (98.6 F) 05/04/20 0855   Pulse 85 05/04/20 0855   Resp 16 05/04/20 0855   SpO2 94 % 05/04/20 0855                 Anesthesia Post Evaluation:     Patient Evaluated: bedside  Patient Participation: complete - patient participated  Level of Consciousness: awake and alert  Pain Score: 0  Pain Management: adequate  Multimodal analgesia pain management approach    Airway Patency: patent    Anesthetic complications: No      PONV Status: none    Cardiovascular status: acceptable  Respiratory status: acceptable  Hydration status: acceptable        Signed by: Virgilio Belling., MD, 05/04/2020 8:56 AM

## 2020-05-04 NOTE — Discharge Instr - AVS First Page (Signed)
Endoscopy Discharge Instructions      COVID  Thank you for allowing us to provide you care during this evolving time. For questions regarding your test results we encourage you to call the office of the physician who performed your exam.     After you leave the hospital:  Due to the effects of the sedatives, you may feel tired for the remainder of today.   We recommend you go home after discharge  It is advisable to have supervision or access to seek help for a few hours after discharge  Do not drive, operate machinery, or sign important documents until tomorrow.  Please rest and drink extra fluids today.    Avoid alcohol today.  Start with light easily tolerated foods advance to a regular diet as tolerated.  Resume your normal activities / work tomorrow    When to seek help -     Nausea / Vomiting: - try small amounts of bland foods like crackers or toast, & liquids such as soda, electrolyte replacement drinks or ice chips. Call for:  New onset or ongoing nausea and vomiting   The symptoms worsen - cold skin, confusion, blood or unexplainable black appearing vomit  Bleeding:  Passing of blood or clots  or a change in stool consistency and or black in color  Vomiting or spitting up blood  Infection:  Redness or swelling at the IV site that continues to worsen. Initial warm compress may help.  Fever  Pain / other:  New onset of pain that does not subside over time or prevents you from normal activity or eating  Shortness of breath or breathing trouble  Weakness, feeling faint, passing out  Swollen or distended abdomen    During business hours call your physician’s office.   After-hours call [facility] [phone] and a physician will be contacted Endoscopy Discharge Instructions      COVID  Thank you for allowing us to provide you care during this evolving time. For questions regarding your test results we encourage you to call the office of the physician who performed your exam.     After you leave  the hospital:  Due to the effects of the sedatives, you may feel tired for the remainder of today.   We recommend you go home after discharge  It is advisable to have supervision or access to seek help for a few hours after discharge  Do not drive, operate machinery, or sign important documents until tomorrow.  Please rest and drink extra fluids today.    Avoid alcohol today.  Start with light easily tolerated foods advance to a regular diet as tolerated.  Resume your normal activities / work tomorrow    When to seek help -     Nausea / Vomiting: - try small amounts of bland foods like crackers or toast, & liquids such as soda, electrolyte replacement drinks or ice chips. Call for:  New onset or ongoing nausea and vomiting   The symptoms worsen - cold skin, confusion, blood or unexplainable black appearing vomit  Bleeding:  Passing of blood or clots  or a change in stool consistency and or black in color  Vomiting or spitting up blood  Infection:  Redness or swelling at the IV site that continues to worsen. Initial warm compress may help.  Fever  Pain / other:  New onset of pain that does not subside over time or prevents you from normal activity or eating  Shortness of breath or   breathing trouble  Weakness, feeling faint, passing out  Swollen or distended abdomen    During business hours call your physician’s office.   After-hours call [facility] [phone] and a physician will be contacted  OBSTRUCTIVE SLEEP APNEA BREATHING RISK INFORMATION  Please read!    BREATHING PRECAUTIONS AFTER YOUR SURGERY TODAY:    Sleep with your head of bed elevated at least 30 degrees extra pillows may be used  Use pain medications sparingly   Please use any breathing machines you may have at home while sleeping  A care partner is suggested for the first 24 hours   Call 911 or seek assistance for any respiratory distress  (i.e. fast or slow breathing that is different from your normal breathing, prolonged pauses in  breathing, blue tinged lips or nailbeds, shortness of breath, or altered mental status such as confusion, sleepiness, or irritability).        Dear Patient,      Thank you for choosing [facility] for your surgery.  We care about your safety during your surgery and when you return home. Today you were screened to help us decide whether you were at risk for problems that may arise with your breathing during surgery.  We used a questionnaire to look for signs of problems you may have with breathing.  The questionnaire is only a screening tool.     Obstructive Sleep Apnea (OSA) is a common breathing disorder that may occur during sleep, during surgery, or after surgery.  Patients who have OSA can sometimes be at a higher risk of breathing complications during and after surgery because of the use of medications to relieve pain.  We use these medications to help manage your discomfort during and after surgery.  These medications may sometimes cause your breathing to slow down or cause the tissues in your throat to relax.  Because these medications can cause these things to happen, we will monitor your breathing during and after surgery while you are in the hospital.        You have a score on your screening questionnaire that indicated you may wish to have further testing in order to look for problems that may arise with your breathing at home after surgery or when you are asleep.  Please share this letter with your primary doctor and discuss whether you may need further testing, such as a sleep study.  A sleep study is the diagnostic test for OSA.  Becoming aware that you have OSA and working with your doctor to treat OSA can help keep you safe.    Sincerely,    Nicolas C. Restrepo, MD  Vice President Medical Affairs  [facility]

## 2020-05-04 NOTE — Anesthesia Preprocedure Evaluation (Signed)
Anesthesia Evaluation    AIRWAY    Mallampati: I    TM distance: >3 FB  Neck ROM: full  Mouth Opening:full   CARDIOVASCULAR    cardiovascular exam normal       DENTAL    no notable dental hx     PULMONARY    pulmonary exam normal     OTHER FINDINGS                  Relevant Problems   NEURO/PSYCH   (+) TIA (transient ischemic attack)      CARDIO   (+) CAD (coronary artery disease)   (+) Hypertension   (+) Unstable angina      ENDO   (+) Type 2 diabetes mellitus   (+) Type 2 diabetes mellitus with diabetic neuropathy               Anesthesia Plan    ASA 3     MAC                             Post op pain management: per surgeon    informed consent obtained                   Signed by: Virgilio Belling., MD 05/04/20 7:15 AM

## 2020-05-05 ENCOUNTER — Encounter (HOSPITAL_BASED_OUTPATIENT_CLINIC_OR_DEPARTMENT_OTHER): Payer: Self-pay | Admitting: Gastroenterology

## 2020-05-06 ENCOUNTER — Other Ambulatory Visit: Payer: Self-pay | Admitting: General Practice

## 2020-05-06 ENCOUNTER — Encounter: Payer: Self-pay | Admitting: General Practice

## 2020-05-06 DIAGNOSIS — M75111 Incomplete rotator cuff tear or rupture of right shoulder, not specified as traumatic: Secondary | ICD-10-CM

## 2020-05-06 DIAGNOSIS — M25511 Pain in right shoulder: Secondary | ICD-10-CM

## 2020-05-12 NOTE — Procedures (Signed)
CARDIOLOGY [facility] HEART & VASCULAR [place], [facility]  [name]  [place] [address]  [phone]       Date: 03/26/2020  Patient Name: Edwin [name]  MRN#: [id number]  DOB: May 04, 1955    Remote ILR Device Interrogation    Model/SN Medtronic [id number] S  Implant Date Sep 14, 2017  Loop Recorder    Patient's ILR was checked by remote access.     31 day analysis was performed of patient's implantable loop recorder, including analysis of programmed parameters, battery, heart rhythm, counters, any observation summaries, and alerts.  Please see programmer printout for further details (scanned document).    Battery     OK    Events/Episodes     None    Diagnostic Observations     No new observations.      Follow up in 1 month, remote.

## 2020-05-13 ENCOUNTER — Encounter: Payer: Self-pay | Admitting: General Practice

## 2020-05-14 ENCOUNTER — Encounter: Payer: Self-pay | Admitting: General Practice

## 2020-05-14 DIAGNOSIS — M75111 Incomplete rotator cuff tear or rupture of right shoulder, not specified as traumatic: Secondary | ICD-10-CM

## 2020-05-14 DIAGNOSIS — M25511 Pain in right shoulder: Secondary | ICD-10-CM

## 2020-05-27 ENCOUNTER — Encounter (INDEPENDENT_AMBULATORY_CARE_PROVIDER_SITE_OTHER): Payer: Medicare PPO

## 2020-05-27 DIAGNOSIS — Z4509 Encounter for adjustment and management of other cardiac device: Secondary | ICD-10-CM
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
## 2020-05-29 ENCOUNTER — Encounter (INDEPENDENT_AMBULATORY_CARE_PROVIDER_SITE_OTHER): Payer: Self-pay | Admitting: Cardiovascular Disease

## 2020-05-29 ENCOUNTER — Other Ambulatory Visit: Payer: Self-pay

## 2020-05-29 ENCOUNTER — Ambulatory Visit (INDEPENDENT_AMBULATORY_CARE_PROVIDER_SITE_OTHER): Payer: Medicare PPO | Admitting: Cardiovascular Disease

## 2020-05-29 VITALS — BP 121/70 | HR 88 | Resp 18 | Ht 68.0 in | Wt 228.0 lb

## 2020-05-29 DIAGNOSIS — I10 Essential (primary) hypertension: Secondary | ICD-10-CM

## 2020-05-29 DIAGNOSIS — I2510 Atherosclerotic heart disease of native coronary artery without angina pectoris: Secondary | ICD-10-CM

## 2020-05-29 DIAGNOSIS — E785 Hyperlipidemia, unspecified: Secondary | ICD-10-CM

## 2020-05-29 DIAGNOSIS — R079 Chest pain, unspecified: Secondary | ICD-10-CM

## 2020-05-29 MED ORDER — RANOLAZINE ER 500 MG TABLET,EXTENDED RELEASE,12 HR
500.0000 mg | ORAL_TABLET | Freq: Two times a day (BID) | ORAL | 1 refills | Status: DC
Start: 2020-05-29 — End: 2020-07-29

## 2020-06-02 ENCOUNTER — Encounter (INDEPENDENT_AMBULATORY_CARE_PROVIDER_SITE_OTHER): Payer: Self-pay

## 2020-06-10 NOTE — Procedures (Signed)
CARDIOLOGY [facility] HEART & VASCULAR [place], [facility]  [name]  [place] [address]  [phone]       Date: 05/27/2020  Patient Name: Edwin [name]  MRN#: [id number]  DOB: 1956-04-10    Remote ILR Device Interrogation    Model/SN Medtronic [id number] S  Implant Date Sep 14, 2017  Loop Recorder    Patient's ILR was checked by remote access.     31 day analysis was performed of patient's implantable loop recorder, including analysis of programmed parameters, battery, heart rhythm, counters, any observation summaries, and alerts.  Please see programmer printout for further details (scanned document).    Battery     OK    Events/Episodes     None    Diagnostic Observations     No new observations.      Follow up in 1 month, remote.

## 2020-06-18 ENCOUNTER — Other Ambulatory Visit (INDEPENDENT_AMBULATORY_CARE_PROVIDER_SITE_OTHER): Payer: Self-pay | Admitting: Cardiovascular Disease

## 2020-06-18 MED ORDER — METOPROLOL TARTRATE 100 MG TABLET
100.0000 mg | ORAL_TABLET | Freq: Two times a day (BID) | ORAL | 1 refills | Status: DC
Start: 2020-06-18 — End: 2021-02-01

## 2020-06-25 ENCOUNTER — Encounter (RURAL_HEALTH_CENTER): Payer: Self-pay | Admitting: Primary Podiatric Medicine

## 2020-06-25 ENCOUNTER — Ambulatory Visit
Payer: No Typology Code available for payment source | Attending: Primary Podiatric Medicine | Admitting: Primary Podiatric Medicine

## 2020-06-25 VITALS — HR 80 | Temp 97.9°F | Wt 231.5 lb

## 2020-06-25 DIAGNOSIS — M21622 Bunionette of left foot: Secondary | ICD-10-CM

## 2020-06-25 DIAGNOSIS — M21372 Foot drop, left foot: Secondary | ICD-10-CM

## 2020-06-25 DIAGNOSIS — L851 Acquired keratosis [keratoderma] palmaris et plantaris: Secondary | ICD-10-CM

## 2020-06-25 DIAGNOSIS — L600 Ingrowing nail: Secondary | ICD-10-CM

## 2020-06-25 DIAGNOSIS — M79672 Pain in left foot: Secondary | ICD-10-CM

## 2020-06-25 DIAGNOSIS — E1142 Type 2 diabetes mellitus with diabetic polyneuropathy: Secondary | ICD-10-CM

## 2020-06-25 DIAGNOSIS — M79671 Pain in right foot: Secondary | ICD-10-CM

## 2020-06-25 DIAGNOSIS — B351 Tinea unguium: Secondary | ICD-10-CM

## 2020-06-25 DIAGNOSIS — M21621 Bunionette of right foot: Secondary | ICD-10-CM

## 2020-06-25 NOTE — Progress Notes (Addendum)
Subjective:    Patient ID: Michael [name] is a 65 y.o. male.    Patient with a history of diabetes and hypertension as well as TIA and spinal stenosis with left-sided a dropfoot deformity.  Patient has not had diabetic shoes for some time and would like this AFO for this left side which seems to be functioning well refurbished to help with the straps.  Denies any open lesions or drainage does complain of dystrophic tender nails which are difficult for him to manage most painful hyperkeratotic lesions.      The following portions of the patient's history were reviewed and updated as appropriate: allergies, current medications, past family history, past medical history, past social history, past surgical history and problem list.    Review of Systems   Constitutional: Negative.    Respiratory: Negative.    Cardiovascular: Positive for leg swelling.   Musculoskeletal: Negative.    Skin: Negative.    Allergic/Immunologic: Negative.    Neurological: Positive for numbness.   Hematological: Negative.    All other systems reviewed and are negative.    Pulse 80, temperature 97.9 F (36.6 C), temperature source Temporal, weight 105 kg (231 lb 8 oz), SpO2 98 %.          Objective:    Physical Exam  Patient with warm dry supple skin over this lower extremity.  With nails that are thick dystrophic discolored and elongated x10 with some incurvation and thickness relieved with debridement.  Patient with hyperkeratotic lesion subfifth bilateral lower extremity.  Is relieved with debridement.  Patient with palpable pulses with mild edema but without ischemic changes or claudication.  With decreased protective sensation to his lower extremity with monofilament with numbness and burning at times with these feet.  Patient with decreased dorsiflexion and eversion strength on this left side with increased pressure at this fifth on bilaterally.      Assessment:       1. Diabetic peripheral neuropathy associated with type 2 diabetes  mellitus    2. Onychomycosis    3. Ingrown nail    4. Foot drop, left foot    5. Pain in both feet    6. Tailor's bunion of both feet    7. Acquired keratoderma          Plan:       Consent we did debride the nails x10 without incident taking care to this thickness and incurvation as needed.  He also did debride these hyperkeratotic lesions times 2 without incident with a 15 blade.  Discussed the import of a regular foot exams and blood glucose control and advised to call with new concerns or complaints over this lower extremity.  Order patient new diabetic shoes as well as refurbishment of the side AFO.    Patient is ambulatory and continues to use and benefit functionally from the left ankle-foot orthosis.  He remains reasonable and necessary.  Patient requires stabilization due to a foot drop on this left foot.  Existing appliance is extremely worn he requires repairs to maintain proper functionality.

## 2020-06-26 ENCOUNTER — Other Ambulatory Visit (INDEPENDENT_AMBULATORY_CARE_PROVIDER_SITE_OTHER): Payer: Self-pay | Admitting: Cardiovascular Disease

## 2020-06-26 MED ORDER — ISOSORBIDE MONONITRATE ER 30 MG TABLET,EXTENDED RELEASE 24 HR
30.0000 mg | ORAL_TABLET | Freq: Every morning | ORAL | 1 refills | Status: DC
Start: 2020-06-26 — End: 2021-02-01

## 2020-06-27 ENCOUNTER — Encounter (INDEPENDENT_AMBULATORY_CARE_PROVIDER_SITE_OTHER): Payer: Medicare PPO

## 2020-06-27 DIAGNOSIS — Z4509 Encounter for adjustment and management of other cardiac device: Secondary | ICD-10-CM

## 2020-06-30 ENCOUNTER — Encounter (RURAL_HEALTH_CENTER): Payer: Self-pay

## 2020-07-03 ENCOUNTER — Encounter (INDEPENDENT_AMBULATORY_CARE_PROVIDER_SITE_OTHER): Payer: Self-pay

## 2020-07-08 ENCOUNTER — Telehealth (HOSPITAL_COMMUNITY): Payer: Self-pay | Admitting: Cardiovascular Disease

## 2020-07-08 NOTE — Telephone Encounter (Signed)
Pre-procedure call completed. NPO status and medications discussed. Pt is informed about visitations policy. Pt will have arranged transportation.

## 2020-07-13 ENCOUNTER — Ambulatory Visit: Payer: No Typology Code available for payment source | Admitting: Anesthesiology

## 2020-07-13 ENCOUNTER — Ambulatory Visit
Admission: RE | Admit: 2020-07-13 | Discharge: 2020-07-13 | Disposition: A | Discharge status: Home / Self Care | Payer: No Typology Code available for payment source | Source: Ambulatory Visit | Attending: General Practice | Admitting: General Practice

## 2020-07-13 DIAGNOSIS — M75111 Incomplete rotator cuff tear or rupture of right shoulder, not specified as traumatic: Secondary | ICD-10-CM

## 2020-07-13 DIAGNOSIS — G8929 Other chronic pain: Secondary | ICD-10-CM | POA: Insufficient documentation

## 2020-07-13 DIAGNOSIS — M25511 Pain in right shoulder: Secondary | ICD-10-CM | POA: Insufficient documentation

## 2020-07-13 DIAGNOSIS — M75101 Unspecified rotator cuff tear or rupture of right shoulder, not specified as traumatic: Secondary | ICD-10-CM | POA: Insufficient documentation

## 2020-07-13 DIAGNOSIS — M12811 Other specific arthropathies, not elsewhere classified, right shoulder: Secondary | ICD-10-CM | POA: Insufficient documentation

## 2020-07-13 MED ORDER — PROPOFOL 200 MG/20ML IV EMUL
INTRAVENOUS | Status: DC | PRN
Start: 2020-07-13 — End: 2020-07-13
  Administered 2020-07-13: 150 mg via INTRAVENOUS

## 2020-07-13 MED ORDER — FENTANYL CITRATE (PF) 50 MCG/ML IJ SOLN (WRAP)
INTRAMUSCULAR | Status: DC | PRN
Start: 2020-07-13 — End: 2020-07-13
  Administered 2020-07-13: 50 ug via INTRAVENOUS

## 2020-07-13 MED ORDER — VH PROPOFOL INFUSION 10 MG/ML (WRAPPED)
INTRAVENOUS | Status: DC | PRN
Start: 2020-07-13 — End: 2020-07-13
  Administered 2020-07-13: 150 ug/kg/min via INTRAVENOUS

## 2020-07-13 MED ORDER — FENTANYL CITRATE (PF) 50 MCG/ML IJ SOLN (WRAP)
INTRAMUSCULAR | Status: AC
Start: 2020-07-13 — End: ?
  Filled 2020-07-13: qty 5

## 2020-07-13 MED ORDER — SODIUM CHLORIDE 0.9 % IV SOLN
INTRAVENOUS | Status: DC | PRN
Start: 2020-07-13 — End: 2020-07-13

## 2020-07-13 MED ORDER — PROPOFOL 200 MG/20ML IV EMUL
INTRAVENOUS | Status: AC
Start: 2020-07-13 — End: ?
  Filled 2020-07-13: qty 20

## 2020-07-13 MED ORDER — VH PHENYLEPHRINE 30 MG IN NS 250 ML INFUSION (SIMPLE)
INTRAVENOUS | Status: DC | PRN
Start: 2020-07-13 — End: 2020-07-13
  Administered 2020-07-13: 50 ug/min via INTRAVENOUS

## 2020-07-13 MED ORDER — LIDOCAINE HCL (PF) 1.5 % IJ SOLN
INTRAMUSCULAR | Status: AC
Start: 2020-07-13 — End: ?
  Filled 2020-07-13: qty 10

## 2020-07-13 MED ORDER — GLYCOPYRROLATE 0.2 MG/ML IJ SOLN (WRAP)
INTRAMUSCULAR | Status: DC | PRN
Start: 2020-07-13 — End: 2020-07-13
  Administered 2020-07-13: .2 mg via INTRAVENOUS

## 2020-07-13 MED ORDER — LIDOCAINE HCL (PF) 2 % IJ SOLN
INTRAMUSCULAR | Status: DC | PRN
Start: 2020-07-13 — End: 2020-07-13
  Administered 2020-07-13: 5 mL via INTRAVENOUS

## 2020-07-13 MED ORDER — VH PROPOFOL INFUSION 10 MG/ML (WRAPPED)
INTRAVENOUS | Status: AC
Start: 2020-07-13 — End: ?
  Filled 2020-07-13: qty 100

## 2020-07-13 NOTE — Nursing Progress Note (Signed)
Patient received to anesthesia prep room at 0945 . Focused nursing assessment completed. Dr. Venetia Constable in to assess patient and anesthesia consent obtained. IV  anesthesia started at 1019. Patient placed on MRI monitors and VSS. Patient taken to MRI room at 1015 via stretcher. Transferred to table and positioned by MRI staff, all lines and tubes patent and secure. LMA inserted orally by Dr. Venetia Constable at 1022, placement verified, etco2 monitoring started, and tube secured with tape-see anesthesia record for all vital signs, medications and oxygen/airway management.Scan started at 1027.

## 2020-07-13 NOTE — Nursing Progress Note (Signed)
MRI completed at 1054 and propofol drip discontinued. Patient was taken back to prep room at 1100. LMA removed by Dr.Nguyen at 1110 to RA, maintains patent airway, respiration regular and unlabored, color pink , vital signs stable.  Patient responding appropriately and IV discontinued at 1120 along with monitors. Patient deemed fit for discharge by Dr. Cyndie Chime. Discharge instructions provided verbal and written copy. No questions or complaints voiced prior to discharge. Patient assisted to wheelchair and taken to car with all belongings in hand.

## 2020-07-13 NOTE — Anesthesia Preprocedure Evaluation (Addendum)
Anesthesia Evaluation    AIRWAY    Mallampati: II    TM distance: >3 FB  Neck ROM: full  Mouth Opening:full   CARDIOVASCULAR    cardiovascular exam normal, regular and normal       DENTAL        (+) upper dentures     PULMONARY    pulmonary exam normal and clear to auscultation     OTHER FINDINGS                Relevant Problems   NEURO/PSYCH   (+) TIA (transient ischemic attack)      CARDIO   (+) CAD (coronary artery disease)   (+) Hypertension   (+) Unstable angina      ENDO   (+) Type 2 diabetes mellitus   (+) Type 2 diabetes mellitus with diabetic neuropathy       PSS Anesthesia Comments: CAD, DM, HTN, HLD, MI, stent    Pt well known to me. No prob w last mri under anes lma        Anesthesia Plan    ASA 3     general               (Risks discussed including but not limited to neurological,cardiovascular, pulmonary complications, intra-operative awareness, death, dental trauma, and allergic reaction.     Questions answered. Pt and/or legal guardian understand and wishes to proceed.    )      Detailed anesthesia plan: general LMA                               Signed by: Darrell Jewel, MD 07/13/20 9:36 AM

## 2020-07-13 NOTE — Discharge Instructions (Signed)
INSTRUCTIONS:   1.  The ordering physician will get the MRI results in 3 to 5 days.  Your physician may call you with the results or may review the results with you at your next scheduled appointment.   2.  The IV site may feel sore.  A moist, cool washcloth to the site for 10 to 15 minutes at a time for a few times should ease the soreness.  However, if the site becomes painful, red, swollen or has any drainage, please see a healthcare professional for advice.             3. We recommend that you have a caregiver with you overnight.    ACTIVITY:   1.  No driving for 24 hours after the procedure.   2.  You may return to work tomorrow.   3.  You may feel tired for a few hours.  This is a normal response so plan for rest and relaxation the rest of today.                MEDICATION:   Resume all medications per your routine.    DIET:   Start with a clear liquid and something easy to digest (example: cracker, toast); if this initial meal is tolerated then you may resume your normal diet.    If you need to speak with a healthcare provider regarding your care after your Anesthesia please call the Radiology Nurses at [phone], Monday-Friday 7:30am-5:00pm.      If you require Emergent Medical care report to the closest  Emergency Room.

## 2020-07-13 NOTE — Transfer of Care (Signed)
Anesthesia Transfer of Care Note    Patient: Michael [name]    Last vitals:   Vitals:    07/13/20 1105   BP: 100/60   Pulse: 74   Resp: 16   Temp: 36.1 C (97 F)   SpO2: 100%       Oxygen: Room Air     Mental Status:awake    Airway: Natural    Cardiovascular Status:  stable

## 2020-07-13 NOTE — Anesthesia Postprocedure Evaluation (Signed)
Anesthesia Post Evaluation    Patient: Michael [name]    * No procedures listed *    Anesthesia type: general    Last Vitals:   Vitals Value Taken Time   BP 100/60 07/13/20 1105   Temp 36.1 C (97 F) 07/13/20 1105   Pulse 74 07/13/20 1105   Resp 16 07/13/20 1105   SpO2 100 % 07/13/20 1105                 Anesthesia Post Evaluation:     Patient Evaluated: bedside  Patient Participation: complete - patient participated  Level of Consciousness: awake  Pain Score: 1  Pain Management: adequate    Airway Patency: patent    Anesthetic complications: No      PONV Status: none    Cardiovascular status: acceptable  Respiratory status: acceptable  Hydration status: acceptable        Signed by: Darrell Jewel, MD, 07/13/2020 11:43 AM

## 2020-07-15 ENCOUNTER — Observation Stay (HOSPITAL_COMMUNITY): Payer: Medicare PPO

## 2020-07-15 ENCOUNTER — Inpatient Hospital Stay
Admission: RE | Admit: 2020-07-15 | Discharge: 2020-07-16 | DRG: 069 | Disposition: A | Discharge status: Home / Self Care | Payer: Medicare PPO | Source: Ambulatory Visit | Attending: Internal Medicine | Admitting: Internal Medicine

## 2020-07-15 ENCOUNTER — Inpatient Hospital Stay (HOSPITAL_COMMUNITY): Payer: Medicare PPO | Admitting: Internal Medicine

## 2020-07-15 ENCOUNTER — Other Ambulatory Visit: Payer: Self-pay

## 2020-07-15 ENCOUNTER — Encounter (HOSPITAL_COMMUNITY)
Admission: RE | Disposition: A | Discharge status: Home / Self Care | Payer: Self-pay | Source: Ambulatory Visit | Attending: Internal Medicine

## 2020-07-15 ENCOUNTER — Observation Stay (HOSPITAL_BASED_OUTPATIENT_CLINIC_OR_DEPARTMENT_OTHER): Payer: Medicare PPO

## 2020-07-15 ENCOUNTER — Encounter (HOSPITAL_COMMUNITY): Payer: Self-pay | Admitting: Cardiovascular Disease

## 2020-07-15 DIAGNOSIS — H538 Other visual disturbances: Secondary | ICD-10-CM | POA: Diagnosis not present

## 2020-07-15 DIAGNOSIS — R079 Chest pain, unspecified: Secondary | ICD-10-CM

## 2020-07-15 DIAGNOSIS — Z79899 Other long term (current) drug therapy: Secondary | ICD-10-CM

## 2020-07-15 DIAGNOSIS — R197 Diarrhea, unspecified: Secondary | ICD-10-CM | POA: Diagnosis not present

## 2020-07-15 DIAGNOSIS — I440 Atrioventricular block, first degree: Secondary | ICD-10-CM

## 2020-07-15 DIAGNOSIS — R200 Anesthesia of skin: Secondary | ICD-10-CM

## 2020-07-15 DIAGNOSIS — Z955 Presence of coronary angioplasty implant and graft: Secondary | ICD-10-CM

## 2020-07-15 DIAGNOSIS — E785 Hyperlipidemia, unspecified: Secondary | ICD-10-CM

## 2020-07-15 DIAGNOSIS — I10 Essential (primary) hypertension: Secondary | ICD-10-CM | POA: Diagnosis present

## 2020-07-15 DIAGNOSIS — I252 Old myocardial infarction: Secondary | ICD-10-CM

## 2020-07-15 DIAGNOSIS — I2510 Atherosclerotic heart disease of native coronary artery without angina pectoris: Secondary | ICD-10-CM | POA: Insufficient documentation

## 2020-07-15 DIAGNOSIS — E119 Type 2 diabetes mellitus without complications: Secondary | ICD-10-CM

## 2020-07-15 DIAGNOSIS — K219 Gastro-esophageal reflux disease without esophagitis: Secondary | ICD-10-CM | POA: Diagnosis present

## 2020-07-15 DIAGNOSIS — Z8673 Personal history of transient ischemic attack (TIA), and cerebral infarction without residual deficits: Secondary | ICD-10-CM

## 2020-07-15 DIAGNOSIS — F40240 Claustrophobia: Secondary | ICD-10-CM | POA: Diagnosis present

## 2020-07-15 DIAGNOSIS — Z20822 Contact with and (suspected) exposure to covid-19: Secondary | ICD-10-CM | POA: Diagnosis present

## 2020-07-15 DIAGNOSIS — E7849 Other hyperlipidemia: Secondary | ICD-10-CM | POA: Diagnosis present

## 2020-07-15 DIAGNOSIS — Z7902 Long term (current) use of antithrombotics/antiplatelets: Secondary | ICD-10-CM

## 2020-07-15 DIAGNOSIS — I639 Cerebral infarction, unspecified: Secondary | ICD-10-CM

## 2020-07-15 DIAGNOSIS — Y848 Other medical procedures as the cause of abnormal reaction of the patient, or of later complication, without mention of misadventure at the time of the procedure: Secondary | ICD-10-CM | POA: Diagnosis present

## 2020-07-15 DIAGNOSIS — T82855A Stenosis of coronary artery stent, initial encounter: Secondary | ICD-10-CM | POA: Diagnosis present

## 2020-07-15 DIAGNOSIS — Z794 Long term (current) use of insulin: Secondary | ICD-10-CM

## 2020-07-15 DIAGNOSIS — G4730 Sleep apnea, unspecified: Secondary | ICD-10-CM | POA: Diagnosis present

## 2020-07-15 DIAGNOSIS — G459 Transient cerebral ischemic attack, unspecified: Principal | ICD-10-CM

## 2020-07-15 DIAGNOSIS — Z7982 Long term (current) use of aspirin: Secondary | ICD-10-CM

## 2020-07-15 DIAGNOSIS — I25119 Atherosclerotic heart disease of native coronary artery with unspecified angina pectoris: Secondary | ICD-10-CM | POA: Diagnosis present

## 2020-07-15 DIAGNOSIS — E1140 Type 2 diabetes mellitus with diabetic neuropathy, unspecified: Secondary | ICD-10-CM | POA: Diagnosis present

## 2020-07-15 LAB — BASIC METABOLIC PANEL
ANION GAP: 11 mmol/L (ref 4–13)
BUN/CREA RATIO: 21 (ref 6–22)
BUN: 18 mg/dL (ref 8–25)
CALCIUM: 9 mg/dL (ref 8.8–10.2)
CHLORIDE: 106 mmol/L (ref 96–111)
CO2 TOTAL: 27 mmol/L (ref 23–31)
CREATININE: 0.84 mg/dL (ref 0.75–1.35)
ESTIMATED GFR: >90 mL/min/BSA (ref >=60)
GLUCOSE: 106 mg/dL (ref 65–125)
POTASSIUM: 3.8 mmol/L (ref 3.5–5.1)
SODIUM: 144 mmol/L (ref 136–145)

## 2020-07-15 LAB — COMPREHENSIVE METABOLIC PANEL, NON-FASTING
ALBUMIN: 3.7 g/dL (ref 3.4–4.8)
ALKALINE PHOSPHATASE: 71 U/L (ref 45–115)
ALT (SGPT): 27 U/L (ref 10–55)
ANION GAP: 10 mmol/L (ref 4–13)
AST (SGOT): 23 U/L (ref 8–45)
BILIRUBIN TOTAL: 4.2 mg/dL — ABNORMAL HIGH (ref 0.3–1.3)
BUN/CREA RATIO: 18 (ref 6–22)
BUN: 16 mg/dL (ref 8–25)
CALCIUM: 9 mg/dL (ref 8.8–10.2)
CHLORIDE: 105 mmol/L (ref 96–111)
CO2 TOTAL: 26 mmol/L (ref 23–31)
CREATININE: 0.91 mg/dL (ref 0.75–1.35)
ESTIMATED GFR: 88 mL/min/BSA (ref >=60)
GLUCOSE: 199 mg/dL — ABNORMAL HIGH (ref 65–125)
POTASSIUM: 4.1 mmol/L (ref 3.5–5.1)
PROTEIN TOTAL: 6.8 g/dL (ref 6.0–8.0)
SODIUM: 141 mmol/L (ref 136–145)

## 2020-07-15 LAB — COVID-19, FLU A/B, RSV RAPID BY PCR
INFLUENZA VIRUS TYPE A: NOT DETECTED
INFLUENZA VIRUS TYPE A: NOT DETECTED
INFLUENZA VIRUS TYPE B: NOT DETECTED
INFLUENZA VIRUS TYPE B: NOT DETECTED
RESPIRATORY SYNCTIAL VIRUS (RSV): NOT DETECTED
RESPIRATORY SYNCTIAL VIRUS (RSV): NOT DETECTED
SARS-CoV-2: NOT DETECTED
SARS-CoV-2: NOT DETECTED

## 2020-07-15 LAB — LIPID PANEL
CHOL/HDL RATIO: 4
CHOLESTEROL: 108 mg/dL (ref 100–200)
HDL CHOL: 27 mg/dL — ABNORMAL LOW (ref >=50)
LDL CALC: 20 mg/dL (ref <100)
NON-HDL: 81 mg/dL (ref <=190)
TRIGLYCERIDES: 303 mg/dL — ABNORMAL HIGH (ref <150)
VLDL CALC: 61 mg/dL — ABNORMAL HIGH (ref <30)

## 2020-07-15 LAB — ECG 12-LEAD
Atrial Rate: 91 {beats}/min
Calculated P Axis: 62 degrees
Calculated R Axis: 32 degrees
Calculated T Axis: 31 degrees
PR Interval: 212 ms
QRS Duration: 82 ms
QT Interval: 366 ms
QTC Calculation: 450 ms
Ventricular rate: 91 {beats}/min

## 2020-07-15 LAB — PT/INR
INR: 1.33
PROTHROMBIN TIME: 15.5 seconds — ABNORMAL HIGH (ref 9.4–12.5)

## 2020-07-15 LAB — CBC WITH DIFF
BASOPHIL #: <0.1 10*3/uL (ref <=0.20)
BASOPHIL %: 0 %
EOSINOPHIL #: 0.22 10*3/uL (ref <=0.50)
EOSINOPHIL %: 3 %
HCT: 43.9 % (ref 38.9–52.0)
HGB: 15 g/dL (ref 13.4–17.5)
IMMATURE GRANULOCYTE #: <0.1 10*3/uL (ref <0.10)
IMMATURE GRANULOCYTE %: 0 % (ref 0–1)
LYMPHOCYTE #: 2.13 10*3/uL (ref 1.00–4.80)
LYMPHOCYTE %: 31 %
MCH: 28.8 pg (ref 26.0–32.0)
MCHC: 34.2 g/dL (ref 31.0–35.5)
MCV: 84.3 fL (ref 78.0–100.0)
MONOCYTE #: 0.47 10*3/uL (ref 0.20–1.10)
MONOCYTE %: 7 %
MPV: 9.7 fL (ref 8.7–12.5)
NEUTROPHIL #: 4.12 10*3/uL (ref 1.50–7.70)
NEUTROPHIL %: 59 %
PLATELETS: 169 10*3/uL (ref 150–400)
RBC: 5.21 10*6/uL (ref 4.50–6.10)
RDW-CV: 13.8 % (ref 11.5–15.5)
WBC: 7 10*3/uL (ref 3.7–11.0)

## 2020-07-15 LAB — CBC
HCT: 44.5 % (ref 38.9–52.0)
HGB: 14.8 g/dL (ref 13.4–17.5)
MCH: 28.8 pg (ref 26.0–32.0)
MCHC: 33.3 g/dL (ref 31.0–35.5)
MCV: 86.6 fL (ref 78.0–100.0)
MPV: 9.5 fL (ref 8.7–12.5)
PLATELETS: 172 10*3/uL (ref 150–400)
RBC: 5.14 10*6/uL (ref 4.50–6.10)
RDW-CV: 13.5 % (ref 11.5–15.5)
WBC: 8.6 10*3/uL (ref 3.7–11.0)

## 2020-07-15 LAB — POC FINGERSTICK GLUCOSE - BMC/JMC (RESULTS)
GLUCOSE, POC: 184 mg/dl — ABNORMAL HIGH (ref 60–100)
GLUCOSE, POC: 169 mg/dl — ABNORMAL HIGH (ref 60–100)

## 2020-07-15 LAB — TROPONIN-I: TROPONIN I: 32 ng/L (ref 7–30)

## 2020-07-15 SURGERY — CORONARY ANGIOGRAPHY W/LEFT HEART CATH W/WO LVG
Anesthesia: Moderate Sedation

## 2020-07-15 MED ORDER — VERAPAMIL 2.5 MG/ML INTRAVENOUS SOLUTION
Freq: Once | INTRAVENOUS | Status: DC | PRN
Start: 2020-07-15 — End: 2020-07-15
  Administered 2020-07-15: 2.5 mg via INTRA_ARTERIAL

## 2020-07-15 MED ORDER — RANOLAZINE ER 500 MG TABLET,EXTENDED RELEASE,12 HR
500.0000 mg | ORAL_TABLET | Freq: Two times a day (BID) | ORAL | Status: DC
Start: 2020-07-15 — End: 2020-07-16
  Administered 2020-07-15 – 2020-07-16 (×2): 500 mg via ORAL
  Filled 2020-07-15 (×4): qty 1

## 2020-07-15 MED ORDER — FENTANYL (PF) 50 MCG/ML INJECTION SOLUTION
Freq: Once | INTRAMUSCULAR | Status: DC | PRN
Start: 2020-07-15 — End: 2020-07-15
  Administered 2020-07-15: 50 ug via INTRAVENOUS
  Administered 2020-07-15: 25 ug via INTRAVENOUS
  Administered 2020-07-15: 50 ug via INTRAVENOUS

## 2020-07-15 MED ORDER — IODIXANOL 320 MG IODINE/ML INTRAVENOUS SOLUTION
INTRAVENOUS | Status: AC
Start: 2020-07-15 — End: 2020-07-15
  Filled 2020-07-15: qty 100

## 2020-07-15 MED ORDER — BIVALIRUDIN 250 MG/50 ML (5 MG/ML) INTRAVENOUS SOLUTION
INTRAVENOUS | Status: DC | PRN
Start: 2020-07-15 — End: 2020-07-15
  Administered 2020-07-15: 0 via INTRAVENOUS
  Administered 2020-07-15 (×2): 1.75 mg/kg/h via INTRAVENOUS

## 2020-07-15 MED ORDER — DAPAGLIFLOZIN PROPANEDIOL 5 MG TABLET
10.0000 mg | ORAL_TABLET | Freq: Every day | ORAL | Status: DC
Start: 2020-07-15 — End: 2020-07-16
  Administered 2020-07-15 – 2020-07-16 (×2): 10 mg via ORAL
  Filled 2020-07-15 (×2): qty 2

## 2020-07-15 MED ORDER — PREGABALIN 50 MG CAPSULE
50.0000 mg | ORAL_CAPSULE | Freq: Three times a day (TID) | ORAL | Status: DC
Start: 2020-07-15 — End: 2020-07-16
  Administered 2020-07-15 – 2020-07-16 (×4): 50 mg via ORAL
  Filled 2020-07-15 (×4): qty 1

## 2020-07-15 MED ORDER — FUROSEMIDE 20 MG TABLET
20.0000 mg | ORAL_TABLET | Freq: Every day | ORAL | Status: DC
Start: 2020-07-16 — End: 2020-07-16
  Administered 2020-07-16: 08:00:00 20 mg via ORAL
  Filled 2020-07-15: qty 1

## 2020-07-15 MED ORDER — LIDOCAINE HCL 10 MG/ML (1 %) INJECTION SOLUTION
INTRAMUSCULAR | Status: AC
Start: 2020-07-15 — End: 2020-07-15
  Filled 2020-07-15: qty 20

## 2020-07-15 MED ORDER — DULOXETINE 30 MG CAPSULE,DELAYED RELEASE
60.0000 mg | DELAYED_RELEASE_CAPSULE | Freq: Every day | ORAL | Status: DC
Start: 2020-07-16 — End: 2020-07-16
  Administered 2020-07-16 (×2): 60 mg via ORAL
  Filled 2020-07-15: qty 2

## 2020-07-15 MED ORDER — MIDAZOLAM 1 MG/ML INJECTION SOLUTION
Freq: Once | INTRAMUSCULAR | Status: DC | PRN
Start: 2020-07-15 — End: 2020-07-15
  Administered 2020-07-15 (×2): 1 mg via INTRAVENOUS

## 2020-07-15 MED ORDER — MIDAZOLAM 1 MG/ML INJECTION SOLUTION
INTRAMUSCULAR | Status: AC
Start: 2020-07-15 — End: 2020-07-15
  Filled 2020-07-15: qty 2

## 2020-07-15 MED ORDER — SODIUM CHLORIDE 0.9 % (FLUSH) INJECTION SYRINGE
10.0000 mL | INJECTION | INTRAMUSCULAR | Status: DC | PRN
Start: 2020-07-15 — End: 2020-07-16

## 2020-07-15 MED ORDER — HYDROXYZINE PAMOATE 50 MG CAPSULE
50.0000 mg | ORAL_CAPSULE | Freq: Every evening | ORAL | Status: DC
Start: 2020-07-15 — End: 2020-07-16
  Administered 2020-07-15: 22:00:00 50 mg via ORAL
  Filled 2020-07-15 (×2): qty 1

## 2020-07-15 MED ORDER — DEXTROSE 50 % IN WATER (D50W) INTRAVENOUS SYRINGE
12.5000 g | INJECTION | INTRAVENOUS | Status: DC | PRN
Start: 2020-07-15 — End: 2020-07-16

## 2020-07-15 MED ORDER — FLUTICASONE PROPIONATE 50 MCG/ACTUATION NASAL SPRAY,SUSPENSION
1.0000 | Freq: Every day | NASAL | Status: DC
Start: 2020-07-16 — End: 2020-07-16
  Administered 2020-07-16: 08:00:00 1 via NASAL
  Filled 2020-07-15: qty 16

## 2020-07-15 MED ORDER — METOPROLOL TARTRATE 50 MG TABLET
100.0000 mg | ORAL_TABLET | Freq: Two times a day (BID) | ORAL | Status: DC
Start: 2020-07-15 — End: 2020-07-16
  Administered 2020-07-15 – 2020-07-16 (×2): 100 mg via ORAL
  Filled 2020-07-15 (×2): qty 2

## 2020-07-15 MED ORDER — HEPARIN (PORCINE) (PF) 2,000 UNIT/1,000 ML IN 0.9 % SODIUM CHLORIDE IV
Freq: Once | INTRAVENOUS | Status: DC | PRN
Start: 2020-07-15 — End: 2020-07-15
  Administered 2020-07-15: 2000 [IU]

## 2020-07-15 MED ORDER — DIAZEPAM 5 MG/ML INJECTION SYRINGE
5.0000 mg | INJECTION | INTRAMUSCULAR | Status: AC
Start: 2020-07-15 — End: 2020-07-15
  Administered 2020-07-15: 16:00:00 5 mg via INTRAVENOUS
  Filled 2020-07-15: qty 2

## 2020-07-15 MED ORDER — ASPIRIN 81 MG CHEWABLE TABLET
CHEWABLE_TABLET | ORAL | Status: AC
Start: 2020-07-15 — End: 2020-07-15
  Filled 2020-07-15: qty 1

## 2020-07-15 MED ORDER — SODIUM CHLORIDE 0.9 % INTRAVENOUS SOLUTION
INTRAVENOUS | Status: AC | PRN
Start: 2020-07-15 — End: 2020-07-15
  Administered 2020-07-15: 100 mL/h via INTRAVENOUS

## 2020-07-15 MED ORDER — NITROGLYCERIN 50 MCG/ML INJECTION
INJECTION | Freq: Once | INTRAVENOUS | Status: DC | PRN
Start: 2020-07-15 — End: 2020-07-15
  Administered 2020-07-15: 200 ug via INTRA_ARTERIAL

## 2020-07-15 MED ORDER — DILTIAZEM 5 MG/ML INTRAVENOUS SOLUTION
INTRAVENOUS | Status: AC
Start: 2020-07-15 — End: 2020-07-15
  Filled 2020-07-15: qty 5

## 2020-07-15 MED ORDER — ATORVASTATIN 40 MG TABLET
40.0000 mg | ORAL_TABLET | Freq: Every evening | ORAL | Status: DC
Start: 2020-07-15 — End: 2020-07-15

## 2020-07-15 MED ORDER — BIVALIRUDIN 5 MG/ML BOLUS FROM INFUSION
Freq: Once | INTRAVENOUS | Status: DC | PRN
Start: 2020-07-15 — End: 2020-07-15
  Administered 2020-07-15: 77.25 mg via INTRAVENOUS

## 2020-07-15 MED ORDER — INSULIN LISPRO 100 UNIT/ML INJECTION SSIP - CITY
1.0000 [IU] | Freq: Four times a day (QID) | SUBCUTANEOUS | Status: DC
Start: 2020-07-15 — End: 2020-07-16
  Administered 2020-07-15 – 2020-07-16 (×2): 2 [IU] via SUBCUTANEOUS
  Administered 2020-07-16: 4 [IU] via SUBCUTANEOUS
  Filled 2020-07-15: qty 300

## 2020-07-15 MED ORDER — HEPARIN (PORCINE) (PF) 1,000 UNIT/500 ML IN 0.9 % SODIUM CHLORIDE IV
INTRAVENOUS | Status: AC
Start: 2020-07-15 — End: 2020-07-15
  Filled 2020-07-15: qty 500

## 2020-07-15 MED ORDER — ATORVASTATIN 40 MG TABLET
80.0000 mg | ORAL_TABLET | Freq: Every evening | ORAL | Status: DC
Start: 2020-07-15 — End: 2020-07-16
  Administered 2020-07-15: 80 mg via ORAL
  Filled 2020-07-15: qty 2

## 2020-07-15 MED ORDER — ACETAMINOPHEN 325 MG TABLET
650.0000 mg | ORAL_TABLET | ORAL | Status: DC | PRN
Start: 2020-07-15 — End: 2020-07-16
  Administered 2020-07-15: 22:00:00 650 mg via ORAL
  Filled 2020-07-15: qty 2

## 2020-07-15 MED ORDER — HEPARIN (PORCINE) (PF) 1,000 UNIT/500 ML IN 0.9 % SODIUM CHLORIDE IV
INTRAVENOUS | Status: AC | PRN
Start: 2020-07-15 — End: 2020-07-15
  Administered 2020-07-15 (×2): 3 mL/h

## 2020-07-15 MED ORDER — HEPARIN (PORCINE) 1,000 UNIT/ML INJECTION SOLUTION
Freq: Once | INTRAMUSCULAR | Status: DC | PRN
Start: 2020-07-15 — End: 2020-07-15
  Administered 2020-07-15: 2000 [IU] via INTRA_ARTERIAL

## 2020-07-15 MED ORDER — ALBUTEROL SULFATE HFA 90 MCG/ACTUATION AEROSOL INHALER
1.0000 | INHALATION_SPRAY | Freq: Four times a day (QID) | RESPIRATORY_TRACT | Status: DC | PRN
Start: 2020-07-15 — End: 2020-07-16

## 2020-07-15 MED ORDER — SODIUM CHLORIDE 0.9 % INTRAVENOUS SOLUTION
INTRAVENOUS | Status: AC
Start: 2020-07-15 — End: 2020-07-15
  Administered 2020-07-15: 15:00:00 0 mL via INTRAVENOUS

## 2020-07-15 MED ORDER — VERAPAMIL 2.5 MG/ML INTRAVENOUS SOLUTION
INTRAVENOUS | Status: AC
Start: 2020-07-15 — End: 2020-07-15
  Filled 2020-07-15: qty 2

## 2020-07-15 MED ORDER — ENOXAPARIN 40 MG/0.4 ML SUB-Q SYRINGE - EAST
40.0000 mg | INJECTION | Freq: Every day | SUBCUTANEOUS | Status: DC
Start: 2020-07-16 — End: 2020-07-16
  Administered 2020-07-16: 08:00:00 40 mg via SUBCUTANEOUS
  Filled 2020-07-15: qty 0.4

## 2020-07-15 MED ORDER — LIDOCAINE HCL 10 MG/ML (1 %) INJECTION SOLUTION
Freq: Once | INTRAMUSCULAR | Status: DC | PRN
Start: 2020-07-15 — End: 2020-07-15
  Administered 2020-07-15: 1 mL via INTRADERMAL

## 2020-07-15 MED ORDER — SODIUM CHLORIDE 0.9 % (FLUSH) INJECTION SYRINGE
10.0000 mL | INJECTION | Freq: Three times a day (TID) | INTRAMUSCULAR | Status: DC
Start: 2020-07-15 — End: 2020-07-16
  Administered 2020-07-15 – 2020-07-16 (×3): 10 mL via INTRAVENOUS
  Administered 2020-07-16: 0 mL via INTRAVENOUS

## 2020-07-15 MED ORDER — PANTOPRAZOLE 40 MG TABLET,DELAYED RELEASE
40.0000 mg | DELAYED_RELEASE_TABLET | Freq: Every day | ORAL | Status: DC
Start: 2020-07-16 — End: 2020-07-16
  Administered 2020-07-16: 08:00:00 40 mg via ORAL
  Filled 2020-07-15: qty 1

## 2020-07-15 MED ORDER — HEPARIN (PORCINE) 5,000 UNIT/ML INJECTION SOLUTION
INTRAMUSCULAR | Status: AC
Start: 2020-07-15 — End: 2020-07-15
  Filled 2020-07-15: qty 1

## 2020-07-15 MED ORDER — HYDROCHLOROTHIAZIDE 12.5 MG TABLET
12.5000 mg | ORAL_TABLET | Freq: Every day | ORAL | Status: DC
Start: 2020-07-16 — End: 2020-07-16
  Administered 2020-07-16 (×2): 12.5 mg via ORAL
  Filled 2020-07-15: qty 1

## 2020-07-15 MED ORDER — NITROGLYCERIN 50 MCG/ML INJECTION
INJECTION | INTRAVENOUS | Status: AC
Start: 2020-07-15 — End: 2020-07-15
  Filled 2020-07-15: qty 30

## 2020-07-15 MED ORDER — CLOPIDOGREL 75 MG TABLET
ORAL_TABLET | ORAL | Status: AC
Start: 2020-07-15 — End: 2020-07-15
  Filled 2020-07-15: qty 1

## 2020-07-15 MED ORDER — FENTANYL (PF) 50 MCG/ML INJECTION SOLUTION
INTRAMUSCULAR | Status: AC
Start: 2020-07-15 — End: 2020-07-15
  Filled 2020-07-15: qty 2

## 2020-07-15 MED ORDER — HEPARIN (PORCINE) (PF) 2,000 UNIT/1,000 ML IN 0.9 % SODIUM CHLORIDE IV
INTRAVENOUS | Status: AC
Start: 2020-07-15 — End: 2020-07-15
  Filled 2020-07-15: qty 1000

## 2020-07-15 MED ORDER — BIVALIRUDIN 250 MG/50 ML (5 MG/ML) INTRAVENOUS SOLUTION
INTRAVENOUS | Status: AC
Start: 2020-07-15 — End: 2020-07-15
  Filled 2020-07-15: qty 50

## 2020-07-15 MED ORDER — SODIUM CHLORIDE 0.9% FLUSH BAG - 250 ML
INTRAVENOUS | Status: DC | PRN
Start: 2020-07-15 — End: 2020-07-16

## 2020-07-15 MED ORDER — IODIXANOL 320 MG IODINE/ML INTRAVENOUS SOLUTION
Freq: Once | INTRAVENOUS | Status: DC | PRN
Start: 2020-07-15 — End: 2020-07-15
  Administered 2020-07-15: 300 mL via INTRAVENOUS

## 2020-07-15 MED ORDER — ASPIRIN 81 MG CHEWABLE TABLET
81.0000 mg | CHEWABLE_TABLET | Freq: Every day | ORAL | Status: DC
Start: 2020-07-15 — End: 2020-07-16
  Administered 2020-07-15 – 2020-07-16 (×2): 81 mg via ORAL
  Filled 2020-07-15: qty 1

## 2020-07-15 MED ORDER — ISOSORBIDE MONONITRATE ER 30 MG TABLET,EXTENDED RELEASE 24 HR
30.0000 mg | ORAL_TABLET | Freq: Every morning | ORAL | Status: DC
Start: 2020-07-15 — End: 2020-07-16
  Administered 2020-07-15 – 2020-07-16 (×2): 30 mg via ORAL
  Filled 2020-07-15 (×2): qty 1

## 2020-07-15 MED ORDER — INSULIN GLARGINE (U-100) 100 UNIT/ML (3 ML) SUBCUTANEOUS PEN
25.0000 [IU] | PEN_INJECTOR | Freq: Every evening | SUBCUTANEOUS | Status: DC
Start: 2020-07-15 — End: 2020-07-16
  Administered 2020-07-15: 22:00:00 25 [IU] via SUBCUTANEOUS
  Filled 2020-07-15: qty 3

## 2020-07-15 MED ORDER — CLOPIDOGREL 75 MG TABLET
75.0000 mg | ORAL_TABLET | Freq: Every day | ORAL | Status: DC
Start: 2020-07-15 — End: 2020-07-16
  Administered 2020-07-15 – 2020-07-16 (×2): 75 mg via ORAL
  Filled 2020-07-15: qty 1

## 2020-07-15 MED ORDER — IODIXANOL 320 MG IODINE/ML INTRAVENOUS SOLUTION
INTRAVENOUS | Status: AC
Start: 2020-07-15 — End: 2020-07-15
  Filled 2020-07-15: qty 200

## 2020-07-15 SURGICAL SUPPLY — 43 items
ANGIO NAMIC PROTECT 72IN WASTE COLLECT FEMALE FIT STRAP CLIP CLS FLUID SYSTEM STRL LF (Cautery Accessories) ×1 IMPLANT
ANGIO NAMIC PROTECT STTN PRESS_URIZE M RA ADPR SYSTEM STRL (Cautery Accessories) ×1
CATH ANGIO 5FR 145D PGTL FL4 FR4 CURVE 100/110CM EXPO FULL LGTH WRE BRD RBST SHAFT VENTRIC TRILON (DIAGNOSTIC) ×1 IMPLANT
CATH ANGIO 5FR 145D PGTL FL4 F_R4 CURVE 100/110CM EXPO FULL (DIAGNOSTIC) ×1
CATH ANGIO 5FR AL1 CURVE 100CM EXPO FULL LGTH WRE BRD RBST SHAFT PERI TRILON (DIAGNOSTIC) ×1 IMPLANT
CATH ANGIO 5FR AL1 CURVE 100CM_EXPO FULL LGTH WRE BRD RBST (DIAGNOSTIC) ×1
CATH ANGIO 5FR FL3.5 CURVE 100CM EXPO FEM TRILON (DIAGNOSTIC) ×1
CATH ANGIO 5FR FL3.5 CURVE 100_CM EXPO FULL LGTH WRE BRD RBST (DIAGNOSTIC) ×1
CATH ANGIO 5FR JACKY RADIAL 3.5 CURVE 100CM OPTITORQUE LRG LUM 2 SH 2 BRD SFT TIP COR SS NYL POLYUR (CARDIAC) ×1
CATH ANGIO 5FR JACKY RADL 3.5_CURVE 100CM OPTITORQUE LRG LUM (CARDIAC) ×1
CATH GD 5FR 100CM EBU3 CURVE LNCHR LRG LUM RADOPQ FLXB DIST SEG COR NYL STRL LF (GUIDING) ×1 IMPLANT
CATH GD 5FR 100CM EBU3 CURVE L_NCHR LRG LUM RADOPQ FLXB DIST (GUIDING) ×1
CATH GD 5FR 100CM EBU3.5 CURVE LNCHR LRG LUM RADOPQ COR NYL STRL LF (GUIDING) ×1 IMPLANT
CATH GD 5FR 100CM EBU3.5 CURVE_LNCHR LRG LUM RADOPQ FLXB (GUIDING) ×1
CATH GD 5FR 100CM STD JL3 CURVE LNCHR LRG LUM RADOPQ FLXB DIST SEG COR PERI NYL STRL LF (GUIDING) ×1 IMPLANT
CATH GD 5FR 100CM STD JL3 CURV_E LNCHR LRG LUM RADOPQ FLXB (GUIDING) ×1
CATH GD 6FR 125CM Q-CURVE 4 RWY LRG LUM RADOPQ 4X2 RND BRD FEM PTFE STRL LF (GUIDING) ×1
CATH GD 6FR 125CM Q-CURVE 4 RW_Y LRG LUM RADOPQ 4X2 RND BRD (GUIDING) ×1
CATH US EE PLAT 3.3-2.9FR 20MM 150CM 24CM 20MHZ RX LUM RADOPQ TAPER TIP GLYDX ACPT .014IN GW TRKBK (ULTRASOUND) ×1 IMPLANT
CATH US EE PLAT 3.3-2.9FR 20MM_150CM 24CM 20MHZ RX LUM (ULTRASOUND) ×1
CONV USE 338564 - PACK SURG ANGIO PRX STRL DISP LF (PVSU) ×1
DEVICE COMPRESS TR BAND 29CM 2 BAL TRNSPR ADJ STRAP AIR TRTN LRG RADIAL ART (BALLOON) ×1 IMPLANT
DEVICE COMPRESS TR BAND 29CM 2_BAL TRNSPR ADJ STRAP AIR TRTN (BALLOON) ×1
DEVICE INFLAT INDFLTR 35.5CM DGT 25 ATM ANGPLSTY (MISCELLANEOUS PT CARE ITEMS) ×1
DEVICE INFLAT INDFLTR 35.5CM D_GT 25 ATM ANGPLSTY (MISCELLANEOUS PT CARE ITEMS) ×1
DISCONTINUED USE 327881 - CATH ANGIO 5FR FL3.5 CURVE 100CM EXPO FEM TRILON (DIAGNOSTIC) ×1 IMPLANT
ELECTRODE DEFIBR 6.25X4.5IN MDTRC CADENCE OVAL SILVER SILVER CL MLFNC ADH RDTRNS PRECNCT ADULT ZOLL (Electrical Supplies) ×1
ELECTRODE DEFIBR 6.25X4.5IN MD_TRC CADENCE OVAL SILVER SILVER (Electrical Supplies) ×1
GW .035IN 260CM ANGIO PTFE FIX COR STD BODY STN FNSH 2 END 3MM J CURVE STRL (WIRE) ×1 IMPLANT
GW INQWR .035IN 260CM FIX COR_FINGER STRG STD SHAFT PTFE VAS (WIRE) ×1
GW RUNTHROUGH .36MM 3CM 180CM RADOPQ X FLPY VAS STRL LF  DISP PTCA (GUIDING) ×2 IMPLANT
GW RUNTHROUGH NS .014IN 3CM 18_CM ATRM X FLPY TIP RADOPQ (GUIDING) ×2
KIT ANGIO ANGIOTOUCH HAND CONTROLLR STOPCOCK TUBE STRL DISP (Other Miscellaneous) ×1 IMPLANT
KIT ANGIO ANGIOTOUCH HAND CONT_ROLLR STOPCOCK TUBING STRL (Other Miscellaneous) ×1
KIT INTROD 10CM 6FR GLIDESHEATH .021IN HDRPH SPRG STR GW BASIC 45CM RADIAL ACCESS STRL DISP (GUIDING) ×1 IMPLANT
KIT INTROD 10CM 6FR GLIDESHEAT_H .021IN HDRPH SPRG STR GW BSC (GUIDING) ×1
KIT MANIFLD SAL CNRST PORT SPIKE TUBE HAND SYRG PRESS TRANSDUC CART ACIST CVI DISP (Connecting Tubes/Misc) ×1
KIT MNFLD SAL CNRST PORT SPIKE_TUBE HAND SYRG PRESS TRANSDUC (Connecting Tubes/Misc) ×1
KIT SYRG ACIST CVI AUTOMATE MU_A2000 REUSE (Other Miscellaneous) ×1
KIT SYRG AUTOMATE MU A2000 REUSE (Other Miscellaneous) ×1 IMPLANT
PACK ANGIOGRAPHY_CS/5 (PVSU) ×2
SHIELD RAD 16X14IN DRP ARMOUR PAD (PROTECTIVE PRODUCTS/GARMENTS) ×1 IMPLANT
SHIELD RAD 16X14IN DRP ARMOUR_PAD (PROTECTIVE PRODUCTS/GARMENTS) ×1
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
## 2020-07-15 NOTE — Nurses Notes (Signed)
Received pt. To Room from ER via stretcher
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
## 2020-07-15 NOTE — ED Nurses Note (Signed)
Report given to [facility] for bed 401B.

## 2020-07-15 NOTE — Nurses Notes (Signed)
Pt. Continues to complain of blurred vision in left eye and numbness to left side of face.

## 2020-07-15 NOTE — Care Plan (Signed)
Problem: Fall Injury Risk  Goal: Absence of Fall and Fall-Related Injury  Outcome: Ongoing (see interventions/notes)  Intervention: Identify and Manage Contributors  Flowsheets (Taken 07/15/2020 1827)  Self-Care Promotion:   independence encouraged   BADL personal objects within reach  Medication Review/Management: medications reviewed  Intervention: Promote Injury-Free Environment  Flowsheets (Taken 07/15/2020 1827)  Safety Promotion/Fall Prevention: activity supervised

## 2020-07-15 NOTE — Nurses Notes (Signed)
Pt c/o complained of feeling "giddy' and numbness on the left side of the face. States "feels odd on the left side of the face" Dr. Howie Ill is in the room neuro assessment is in progress by MD. No motor deficits are noted. Answers all questions appropriately.  Code stroke is called. Continuing to monitor the pt. Orders are in progress.

## 2020-07-15 NOTE — Nurses Notes (Signed)
Tr band was removed. Right radial site remains without complications. Dressing is CDI. Arm board is in place. 0.9% NS continues per orders, no complications are noted related to IV therapy. Tolerated PO intake without difficulty, up in the arm chair. Call light is within reach.

## 2020-07-15 NOTE — ED Triage Notes (Signed)
Patient from outpatient cath lab for stroke like symptoms, last known well 14:24. Per RN, patient w/ complaint of numbness/tinging to left side of face congruent with symptoms from previous TIAs. Patient arrives alert/oriented x4, GCS 15, unlabored respirations. No facial droop, no slurred speech, no focal weakness to any extremities. Continues to complain of numbness/tingling to left face. Patient received aspirin and Plavix today prior to PCI procedure.

## 2020-07-15 NOTE — Care Plan (Addendum)
[facility]  Care Plan Note    Alert and oriented.   Complained of  L foot pain thus PRN tylenol was provided with good relief.  Able to walk to the bathroom with standby assist.  Maintained on fall precaution.  Accucheck was done with insulin coverage provided.  On tele monitor #32.  VS and neurocheck Q2 hrs.  Hourly rounding for safety and comfort provided.  Call light within reach.       Jesus Genera, RN    Problem: Adult Inpatient Plan of Care  Goal: Plan of Care Review  Outcome: Ongoing (see interventions/notes)  Goal: Patient-Specific Goal (Individualized)  Outcome: Ongoing (see interventions/notes)  Goal: Absence of Hospital-Acquired Illness or Injury  Outcome: Ongoing (see interventions/notes)  Goal: Optimal Comfort and Wellbeing  Outcome: Ongoing (see interventions/notes)  Goal: Rounds/Family Conference  Outcome: Ongoing (see interventions/notes)     Problem: Adjustment to Illness (Stroke, Ischemic/Transient Ischemic Attack)  Goal: Optimal Coping  Outcome: Ongoing (see interventions/notes)     Problem: Bowel Elimination Impaired (Stroke, Ischemic/Transient Ischemic Attack)  Goal: Effective Bowel Elimination  Outcome: Ongoing (see interventions/notes)     Problem: Cerebral Tissue Perfusion (Stroke, Ischemic/Transient Ischemic Attack)  Goal: Optimal Cerebral Tissue Perfusion  Outcome: Ongoing (see interventions/notes)     Problem: Cognitive Impairment (Stroke, Ischemic/Transient Ischemic Attack)  Goal: Optimal Cognitive Function  Outcome: Ongoing (see interventions/notes)     Problem: Communication Impairment (Stroke, Ischemic/Transient Ischemic Attack)  Goal: Improved Communication Skills  Outcome: Ongoing (see interventions/notes)     Problem: Functional Ability Impaired (Stroke, Ischemic/Transient Ischemic Attack)  Goal: Optimal Functional Ability  Outcome: Ongoing (see interventions/notes)     Problem: Respiratory Compromise (Stroke, Ischemic/Transient Ischemic Attack)  Goal: Effective  Oxygenation and Ventilation  Outcome: Ongoing (see interventions/notes)     Problem: Sensorimotor Impairment (Stroke, Ischemic/Transient Ischemic Attack)  Goal: Improved Sensorimotor Function  Outcome: Ongoing (see interventions/notes)     Problem: Eating/Swallowing Impairment (Stroke, Ischemic/Transient Ischemic Attack)  Goal: Oral Intake without Aspiration  Outcome: Ongoing (see interventions/notes)     Problem: Urinary Elimination Impaired (Stroke, Ischemic/Transient Ischemic Attack)  Goal: Effective Urinary Elimination  Outcome: Ongoing (see interventions/notes)     Problem: Fall Injury Risk  Goal: Absence of Fall and Fall-Related Injury  Outcome: Ongoing (see interventions/notes)
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
## 2020-07-15 NOTE — Nurses Notes (Signed)
Report given to [facility]. Right radial site reviewed with Tonya - no bleeding, oozing or hematoma noted

## 2020-07-15 NOTE — Care Management Notes (Signed)
No prior auth required for CT Brain [address]  Ref#[id number]  [phone]

## 2020-07-15 NOTE — Nurses Notes (Signed)
Stroke team and Dr. Darnelle Bos is at bedside. Pt is being taken to CT Scan.

## 2020-07-15 NOTE — Progress Notes (Signed)
Called into the cath holding by patient's nurse as pt reported left upper facial numbness:    Left upper facial numbness noted on my exam. No motor deficit noted in the face or the extremities. Pt denied any vision changes, speech or swallowing difficulty. Pt denies any CP, SOB.    Plan:  Will order head CT wo contrast  Will activate code stroke      Renata Caprice, MD

## 2020-07-15 NOTE — Nurses Notes (Signed)
Pt arrived to cath lab holding post cardiac catheterization with intervention aborted. Pt is A&ox4. No complaints of pain or discomfort at present. Tr band is in place and functional to right wist. No bleeding No hematoma. Bed is in a low, locked position, side rails are up x2. Call light is within reach.

## 2020-07-15 NOTE — Nurses Notes (Signed)
Patient presents to cath lab holding room 5. Patient attached to monitor- pre procedure assessment completed. Blood sent to lab and EKG performed

## 2020-07-15 NOTE — Discharge Summary (Signed)
[facility]    DISCHARGE SUMMARY      PATIENT NAME:  Edwin [name], Edwin [name]  MRN:  [id number]  DOB:  [birth year]    ENCOUNTER START DATE:  07/15/2020  DISCHARGE DATE: 07/15/2020    ATTENDING PHYSICIAN: Renata Caprice, MD    PRIMARY CARE PHYSICIAN: Camelia Eng, MD     ADMISSION DIAGNOSIS: CAD (coronary artery disease)      DISCHARGE DIAGNOSIS:   Principal Problem:  CAD (coronary artery disease)    Active Hospital Problems    Diagnosis Date Noted    Principle Problem: CAD (coronary artery disease) [I25.10] 05/29/2020      Resolved Hospital Problems   No resolved problems to display.     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      Details   acetaminophen 500 mg Tablet  Commonly known as: TYLENOL   500 mg, Oral, EVERY 4 HOURS PRN  Refills: 0     aspirin 81 mg Tablet, Delayed Release (E.C.)  Commonly known as: ECOTRIN   81 mg, Oral, DAILY  Refills: 0     atorvastatin 80 mg Tablet  Commonly known as: LIPITOR   80 mg, Oral, EVERY EVENING  Qty: 90 Tablet  Refills: 3     CALCIUM 300 ORAL   1,000 mg, Oral, DAILY  Refills: 0     cetirizine 10 mg Tablet  Commonly known as: ZYRTEC   10 mg, Oral, DAILY  Refills: 0     cholecalciferol (vitamin D3) 25 mcg (1,000 unit) Tablet   1,000 Units, Oral, EVERY 7 DAYS  Refills: 0     clopidogreL 75 mg Tablet  Commonly known as: PLAVIX   75 mg,  Oral, DAILY  Qty: 90 Tablet  Refills: 1     cyanocobalamin 1,000 mcg Tablet  Commonly known as: VITAMIN B 12   1,000 mcg, Oral, DAILY  Refills: 0     dapagliflozin 10 mg Tablet  Commonly known as: FARXIGA   10 mg, Oral, DAILY  Refills: 0     DULoxetine 60 mg Capsule, Delayed Release(E.C.)  Commonly known as: CYMBALTA DR   60 mg, Oral, DAILY  Refills: 0     ergocalciferol (vitamin D2) 1,250 mcg (50,000 unit) Capsule  Commonly known as: DRISDOL   50,000 Units, Oral, EVERY 7 DAYS  Refills: 0     fluticasone propionate 50 mcg/actuation Spray, Suspension  Commonly known as: FLONASE   1 Spray, Each Nostril, DAILY  Refills: 0     furosemide 20 mg Tablet  Commonly known as: LASIX   20 mg, Oral, DAILY  Refills: 0     HumaLOG U-100 Insulin 100 unit/mL  Solution  Generic drug: insulin lispro   100 Units, Subcutaneous, DAILY  Refills: 0     hydrOXYzine pamoate 50 mg Capsule  Commonly known as: VISTARIL   50 mg, Oral, NIGHTLY  Refills: 0     isosorbide mononitrate 30 mg Tablet Sustained Release 24 hr  Commonly known as: IMDUR   30 mg, Oral, EVERY MORNING  Qty: 90 Tablet  Refills: 1     lidocaine 5 % Adhesive Patch, Medicated  Commonly known as: LIDODERM   1 Patch, Transdermal, DAILY  Refills: 0     magnesium oxide,aspartate,citr 400 mg magnesium Capsule   Oral, DAILY  Refills: 0     metoprolol tartrate 100 mg Tablet  Commonly known as: LOPRESSOR   100 mg, Oral, 2 TIMES DAILY  Qty: 180 Tablet  Refills: 1     pantoprazole 40 mg Tablet, Delayed Release (E.C.)  Commonly known as: PROTONIX   40 mg, Oral, DAILY  Refills: 0     pregabalin 50 mg Capsule  Commonly known as: LYRICA   50 mg, Oral, 3 TIMES DAILY  Refills: 0     ranolazine 500 mg Tablet Sustained Release 12 hr  Commonly known as: Ranexa   500 mg, Oral, 2 TIMES DAILY  Qty: 60 Tablet  Refills: 1     Vascepa 1 gram Capsule  Generic drug: icosapent ethyL   2 g, Oral, 2 TIMES DAILY  Refills: 0     ZINC-15 ORAL   Oral, DAILY  Refills: 0        STOP taking these medications.     albuterol sulfate 90 mcg/actuation HFA Aerosol Inhaler  Commonly known as: PROVENTIL or VENTOLIN or PROAIR     hydroCHLOROthiazide 12.5 mg Capsule  Commonly known as: MICROZIDE     metFORMIN 500 mg Tablet Sustained Release 24 hr  Commonly known as: GLUCOPHAGE XR     predniSONE 10 mg Tablet  Commonly known as: DELTASONE              REASON FOR HOSPITALIZATION AND HOSPITAL COURSE:      COURSE IN HOSPITAL:  This is a 65 y.o., male who was brought to the cath lab for [facility], coronary angio +/- angioplasty. Cath was significant for high grade ostial LCx stenosis. Pt tolerate the procedure well and there were no complications. Given the amount of dye and radiation needed for diagnostic pictures it was decided stop and bring patient back on a different day for planned LCX ostial PCI. Plan discussed with the pt and he seemed agreeable. Discharge to home on current cardiac meds.     DOES PATIENT HAVE ADVANCED DIRECTIVES:  Yes, Patient Does Have Advance Directive for Healthcare Treatment    CONDITION ON DISCHARGE: Alert, Oriented and VS Stable    DISCHARGE DISPOSITION:  Home discharge     Copies sent to Care Team       Relationship Specialty Notifications Start End    Camelia Eng, MD PCP - General FAMILY PRACTICE  05/29/20     Phone: [phone] Fax: [phone]         [address] [place] [address]             Renata Caprice, MD

## 2020-07-15 NOTE — Nurses Notes (Signed)
Pt brought to ED room 32 post CT scan. Report to [place], [place]. Dr Ellery Plunk is at bedside.

## 2020-07-15 NOTE — Care Management Notes (Signed)
No Prior Auth required for MRI Brain [id number]  Ref#[id number]  [phone]

## 2020-07-15 NOTE — Nurses Notes (Signed)
Notified Dr. Prasad with consult.

## 2020-07-16 ENCOUNTER — Inpatient Hospital Stay (HOSPITAL_COMMUNITY): Payer: Medicare PPO

## 2020-07-16 DIAGNOSIS — E785 Hyperlipidemia, unspecified: Secondary | ICD-10-CM

## 2020-07-16 DIAGNOSIS — R519 Headache, unspecified: Secondary | ICD-10-CM

## 2020-07-16 DIAGNOSIS — I700 Atherosclerosis of aorta: Secondary | ICD-10-CM

## 2020-07-16 DIAGNOSIS — M2578 Osteophyte, vertebrae: Secondary | ICD-10-CM

## 2020-07-16 DIAGNOSIS — I771 Stricture of artery: Secondary | ICD-10-CM

## 2020-07-16 DIAGNOSIS — I708 Atherosclerosis of other arteries: Secondary | ICD-10-CM

## 2020-07-16 DIAGNOSIS — R200 Anesthesia of skin: Secondary | ICD-10-CM

## 2020-07-16 DIAGNOSIS — I10 Essential (primary) hypertension: Secondary | ICD-10-CM

## 2020-07-16 DIAGNOSIS — I639 Cerebral infarction, unspecified: Secondary | ICD-10-CM

## 2020-07-16 LAB — ECG 12-LEAD
Atrial Rate: 97 {beats}/min
Calculated P Axis: 58 degrees
Calculated R Axis: 33 degrees
Calculated T Axis: 35 degrees
PR Interval: 206 ms
QRS Duration: 86 ms
QT Interval: 338 ms
QTC Calculation: 429 ms
Ventricular rate: 97 {beats}/min

## 2020-07-16 LAB — POC FINGERSTICK GLUCOSE - BMC/JMC (RESULTS)
GLUCOSE, POC: 125 mg/dl — ABNORMAL HIGH (ref 60–100)
GLUCOSE, POC: 215 mg/dl — ABNORMAL HIGH (ref 60–100)
GLUCOSE, POC: 201 mg/dl — ABNORMAL HIGH (ref 60–100)

## 2020-07-16 LAB — HGA1C (HEMOGLOBIN A1C WITH EST AVG GLUCOSE)
ESTIMATED AVERAGE GLUCOSE: 186 mg/dL
HEMOGLOBIN A1C: 8.1 % — ABNORMAL HIGH (ref 4.0–5.6)

## 2020-07-16 MED ORDER — IOPAMIDOL 370 MG IODINE/ML (76 %) INTRAVENOUS SOLUTION
100.0000 mL | INTRAVENOUS | Status: AC
Start: 2020-07-16 — End: 2020-07-16
  Administered 2020-07-16: 12:00:00 90 mL via INTRAVENOUS
  Filled 2020-07-16: qty 100

## 2020-07-16 MED ORDER — SODIUM CHLORIDE 0.9 % INJECTION SOLUTION
2.0000 mL | INTRAVENOUS | Status: AC
Start: 2020-07-16 — End: 2020-07-16
  Administered 2020-07-16: 15:00:00 2 mL via INTRAVENOUS

## 2020-07-16 MED ORDER — FREESTYLE LIBRE 14 DAY SENSOR KIT
PACK | 0 refills | Status: DC
Start: 2020-07-16 — End: 2020-08-19

## 2020-07-16 NOTE — Care Management Notes (Signed)
07/16/20 1205   Assessment Detail   Assessment Type Admission   Date of Care Management Update 07/16/20   Date of Next DCP Update 07/19/20   Readmission   Is this a readmission? No   Social Work Animal nutritionist Status initial meeting   Anticipated Discharge Disposition Home   Discharge Needs Assessment   Equipment Currently Used at Home other (see comments)  (uses walking stick)   Transportation Available car;family or friend will provide   Referral Information   Admission Type inpatient   Arrived From home or self-care   Address Verified verified-no changes   I met with patient, his friend Konrad Dolores at beside, to initiate DC planning. Address, phone number, and insurance verified. Patient lives alone, but says he is planning to go stay with [facility] for 1 night before going home. I asked if he would like me to add his friend as a contact and he said, "Demetrios Isaacs go ahead." Friend introduced himself as Morley Kos and his number [phone]. Patient drives and can rely on friends and family for transportation as needed. He uses a walking stick only, but no DME. Prescriptions are filled through [facility] in [place], [place], but he plans to fill through [facility] at [place]. PCP is Dr. Emmit Alexanders in [place], and he is active with him, next appointment Tuesday. I discussed with him about need for Outpatient Skilled Therapy vs Home Health. He let me know that therapy had him walking without need for DME. I asked if they had to hold onto him to minimally assist with balance and he told me, "No." I explained to him that likely they may recommend Home vs Outpatient Therapy. He voiced understanding. No new DC needs identified at this time. CM will continue to follow for DC planning.

## 2020-07-16 NOTE — Ancillary Notes (Signed)
Echo with bubble study completed at bedside. Definity given and administered by myself.      [place], RDCS  07/16/2020, 15:11

## 2020-07-16 NOTE — Care Plan (Signed)
Problem: Fall Injury Risk  Goal: Absence of Fall and Fall-Related Injury  Outcome: Ongoing (see interventions/notes)  Intervention: Identify and Manage Contributors  Flowsheets (Taken 07/16/2020 0943)  Self-Care Promotion:   independence encouraged   BADL personal objects within reach  Medication Review/Management: medications reviewed  Intervention: Promote Injury-Free Environment  Flowsheets (Taken 07/16/2020 [phone])  Safety Promotion/Fall Prevention:   activity supervised   nonskid shoes/slippers when out of bed

## 2020-07-16 NOTE — Pharmacy (Signed)
Upon discharge, patient would like to use the discharge pharmacy service. I have updated the patient's preferred pharmacy to [facility] while he is here. Please send any discharge scripts to us, either electronically or if printed call the pharmacy. Thanks.      Pharmacy Hours:  Monday: 7:30 am - 8 pm  Tuesday-Friday: 7:30 am - 6 pm  Saturday: 9 am - 3 pm  Sunday: closed

## 2020-07-16 NOTE — Discharge Summary (Signed)
[facility]  [place], [place] [address]    DISCHARGE SUMMARY      PATIENT NAME:  Edwin [name], Edwin [name]  MRN:  [id number]  DOB:  18-Dec-1955    ADMISSION DATE:  07/15/2020  DISCHARGE DATE:  07/16/2020    ATTENDING PHYSICIAN: Eloisa Northern, MD  PRIMARY CARE PHYSICIAN: Camelia Eng, MD     ADMISSION DIAGNOSIS: CAD (coronary artery disease)  DISCHARGE DIAGNOSIS:   Active Hospital Problems    Diagnosis Date Noted   . Principle Problem: CAD (coronary artery disease) [I25.10] 05/29/2020   . TIA (transient ischemic attack) [G45.9] 07/15/2020      Resolved Hospital Problems   No resolved problems to display.     Active Non-Hospital Problems    Diagnosis Date Noted   . 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      Details   acetaminophen 500 mg Tablet  Commonly known as: TYLENOL   500 mg, Oral, EVERY 4 HOURS PRN  Refills: 0     aspirin 81 mg Tablet, Delayed Release (E.C.)  Commonly known as: ECOTRIN   81 mg, Oral, DAILY  Refills: 0     atorvastatin 80 mg Tablet  Commonly known as: LIPITOR   80 mg, Oral, EVERY EVENING  Qty: 90 Tablet  Refills: 3     CALCIUM 300 ORAL   1,000 mg, Oral, DAILY  Refills: 0     cetirizine 10 mg Tablet  Commonly known as: ZYRTEC   10 mg, Oral, DAILY  Refills: 0     cholecalciferol (vitamin D3) 25 mcg (1,000 unit) Tablet   1,000 Units, Oral, EVERY 7 DAYS  Refills: 0     clopidogreL 75 mg Tablet  Commonly known as: PLAVIX   75 mg, Oral, DAILY  Qty: 90 Tablet  Refills: 1     cyanocobalamin 1,000 mcg Tablet  Commonly known as: VITAMIN B 12   1,000 mcg, Oral, DAILY  Refills: 0     dapagliflozin 10 mg Tablet  Commonly known as: FARXIGA   10 mg, Oral, DAILY  Refills: 0     DULoxetine 60 mg Capsule, Delayed Release(E.C.)  Commonly known as: CYMBALTA DR   60 mg, Oral, DAILY  Refills: 0     ergocalciferol (vitamin D2) 1,250 mcg (50,000 unit) Capsule  Commonly  known as: DRISDOL   50,000 Units, Oral, EVERY 7 DAYS  Refills: 0     fluticasone propionate 50 mcg/actuation Spray, Suspension  Commonly known as: FLONASE   1 Spray, Each Nostril, DAILY  Refills: 0     furosemide 20 mg Tablet  Commonly known as: LASIX   20 mg, Oral, DAILY  Refills: 0     HumaLOG U-100 Insulin 100 unit/mL Solution  Generic drug: insulin lispro   100 Units, Subcutaneous, DAILY  Refills: 0     hydrOXYzine pamoate 50 mg Capsule  Commonly known as: VISTARIL   50 mg, Oral, NIGHTLY  Refills: 0     isosorbide mononitrate 30 mg Tablet Sustained Release 24 hr  Commonly known as: IMDUR   30 mg, Oral, EVERY MORNING  Qty: 90 Tablet  Refills: 1     lidocaine 5 % Adhesive Patch, Medicated  Commonly known as: LIDODERM   1 Patch, Transdermal, DAILY  Refills: 0     magnesium oxide,aspartate,citr 400 mg magnesium Capsule   Oral,  DAILY  Refills: 0     metoprolol tartrate 100 mg Tablet  Commonly known as: LOPRESSOR   100 mg, Oral, 2 TIMES DAILY  Qty: 180 Tablet  Refills: 1     pantoprazole 40 mg Tablet, Delayed Release (E.C.)  Commonly known as: PROTONIX   40 mg, Oral, DAILY  Refills: 0     pregabalin 50 mg Capsule  Commonly known as: LYRICA   50 mg, Oral, 3 TIMES DAILY  Refills: 0     ranolazine 500 mg Tablet Sustained Release 12 hr  Commonly known as: Ranexa   500 mg, Oral, 2 TIMES DAILY  Qty: 60 Tablet  Refills: 1     Vascepa 1 gram Capsule  Generic drug: icosapent ethyL   2 g, Oral, 2 TIMES DAILY  Refills: 0     ZINC-15 ORAL   Oral, DAILY  Refills: 0        STOP taking these medications.    albuterol sulfate 90 mcg/actuation HFA Aerosol Inhaler  Commonly known as: PROVENTIL or VENTOLIN or PROAIR     hydroCHLOROthiazide 12.5 mg Capsule  Commonly known as: MICROZIDE     metFORMIN 500 mg Tablet Sustained Release 24 hr  Commonly known as: GLUCOPHAGE XR     predniSONE 10 mg Tablet  Commonly known as: DELTASONE          DISCHARGE INSTRUCTIONS:   No discharge procedures on file.  REASON FOR HOSPITALIZATION AND HOSPITAL  COURSE:  This is a 65 y.o., male with a past medical history of type 2 diabetes, coronary artery disease, hypertension hyperlipidemia was brought into the hospital electively for cardiac catheterization, patient was noted to have high-grade ostial LCX stenosis and was plan for angioplasty a later time.  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  Code stroke was activated patient underwent CT head that was negative for any acute pathology, care was discussed with stroke neurology and it was determined the patient is not a candidate for tPA.  CTA of the head and neck was done as well that was negative for any acute pathology.  Unfortunately were unable to obtain an MRI due to patient's severe claustrophobia.    Patient already has the with recorder, patient will continue with aspirin plus Plavix.  Patient will be discharged home today.    32 minute spent on discharge planning including coordination of care and counseling.    Objective      Physical exam :  GENERAL: The patient is alert and oriented to time, place and person.   HEENT: Normocephalic, anicteric. No pallor. PERRL.   NECK: Supple. No jugular venous distention.   LUNGS: clear breath sounds are audible bilaterally. Good bilateral air entry.  HEART: Both first and second sounds are audible, regular sinus rhythm. No murmur.  ABDOMEN: Soft, bowel sounds are present, nontender, no hepatosplenomegaly.   EXTREMITIES: No edema. Peripheral pulses are present.   CENTRAL NERVOUS SYSTEM: No focal deficit, no cranial nerve palsy.   SKIN: No rashes or bruises.   MUSCULOSKELETAL SYSTEM: No deformity or swelling.   PSYCHIATRIC: No anxiety or depression.   HEMATOLOGIC: No ecchymosis, petechia or hematoma.      COURSE IN HOSPITAL: As above.  Please see admitting physician/Midlevels H&P    CT BRAIN WO IV CONTRAST    Result Date: 07/15/2020  Edwin [name] Male, 65 years old. CT BRAIN WO IV CONTRAST  performed on 07/15/2020 2:50 PM. REASON FOR EXAM:  facial numbness RADIATION DOSE: 1133.30 mGycm TECHNIQUE: Unenhanced CT COMPARISON: 12/05/2016 FINDINGS:  Residual contrast from recent coronary intervention. Within these limitations, there is no acute intracranial hemorrhage. The gray-white matter differentiation is preserved. There is no hydrocephalus. No abnormal enhancement is appreciated.     No acute intracranial process. Residual contrast from recent coronary angiography Radiologist location ID: [id number]     ECG 12-LEAD    Result Date: 07/16/2020  Sinus rhythm with occasional Premature ventricular complexes and Premature atrial complexes Otherwise normal ECG When compared with ECG of 15-Jul-2020 07:21, Premature ventricular complexes are now Present Premature atrial complexes are now Present SEE ED PROVIDER NOTE IN EPIC Confirmed by BEST  DO, RONALD ([id number]), editor Dusing, Lexus ([id number]) on 07/16/2020 11:41:36 AM    ECG 12-LEAD    Result Date: 07/15/2020  Sinus rhythm with 1st degree A-V block Otherwise normal ECG No previous ECGs available Confirmed by AHMADO, MD, IMAD ([id number]) on 07/15/2020 2:54:34 PM    CT ANGIO INTRA-EXTRA CRANIAL W IV CONTRAST (Head-Neck)    Result Date: 07/16/2020  Edwin [name] Male, 65 years old. CT ANGIO INTRA-EXTRA CRANIAL W IV CONTRAST performed on 07/16/2020 12:00 PM. REASON FOR EXAM:  eval for stenosis RADIATION DOSE: 1716.60 mGycm CONTRAST: 90 ml's of Isovue 370 TECHNIQUE: CTA of the head and neck was performed. Just prior to this, noncontrast head CT was performed. The planar reformatted imaging is available. 1219 images are submitted. COMPARISON: CT brain performed 07/15/2020 FINDINGS: Noncontrast head CT: Brain volume appears appropriate for the patient's age. There is normal gray-white delineation. The ventricles and overlying cortical sulci are congruent for size and are age appropriate. The pituitary, corpus callosum, and cerebellar tonsils are unremarkable in appearance. No  unexpected extra-axial fluid collections are appreciated. CTA NECK: Aortic arch great vessel origins: There are atherosclerotic calcifications of the aortic arch. No deep ulcerations are appreciated. The aortic arch great vessel origins are patent demonstrating mild atherosclerotic changes. Vertebrobasilar system: There is a left dominant configuration of the vertebral arteries within the neck. Right vertebral artery: There are calcifications at the origin of the right vertebral artery. This somewhat obscures accurate evaluation of the degree of stenosis. There is at least mild and possibly moderate stenosis at the origin. The cervical extradural portion of the right vertebral artery demonstrates no high-grade stenoses. No vessel injury is appreciated. Left vertebral artery: The left vertebral artery origin is patent. The cervical extent of the left vertebral artery demonstrates no high-grade stenoses. There is extrinsic mass effect upon the left vertebral artery at the C7 level. This causes approximately 65% diameter stenosis. This is caused from adjacent osteophytes. The remainder of the left vertebral artery demonstrates wide patency. Right carotid system: The right common carotid artery is patent. The right distal common carotid artery demonstrates atherosclerotic calcifications. The carotid bifurcation demonstrates atherosclerotic calcification, but no high-grade stenoses. The cervical right internal carotid artery is widely patent. The external carotid artery origin is patent. Left carotid system: Left common carotid artery is patent. There are some atherosclerotic calcifications along its course. Left carotid bifurcation demonstrates very mild atherosclerotic changes. No stenoses are appreciated involving the left cervical internal carotid artery. Other: Significant degenerative changes throughout the cervical spine with prior both anterior and posterior fixation and findings suggestive of ossification and  calcifications of the posterior longitudinal ligaments.     1. Unremarkable noncontrast head CT. 2. Atherosclerotic changes at the origin of the right vertebral artery does not allow accurate evaluation. There is at least mild and  possibly moderate stenosis. 3. There is approximately 65% diameter stenosis of the left vertebral artery at the level of C7 secondary to adjacent osteophytes arising from the thoracic spine. 4. Atherosclerotic changes involving the carotid bifurcations, but no evident stenoses or ulcerations are appreciated. Radiologist location ID: [id number]                CONDITION ON DISCHARGE: Alert    DISCHARGE DISPOSITION:  Home discharge     - PCP appointment was scheduled/requested within 7 days post discharge ? Yes     - Communication to PCP was done at the time of discharge? Yes     - Discharge summary completed at the time of discharge/prior to Follow-up appointment With PCP? Yes     cc: Primary Care Physician:  Camelia Eng, MD  [address]  [place] [address]     [id number] Physician:  No referring provider defined for this encounter.   Eloisa Northern, MD

## 2020-07-16 NOTE — Nurses Notes (Signed)
Pt. States that the numbness in the left side of his face is less severe today than yesterday. He describes the feeling today as, "it is just odd feeling on the left side of my face." Pt. Denies any new complaints.

## 2020-07-16 NOTE — Care Management Notes (Signed)
07/16/20 1351   Medicare Intent to Discharge Documentation   Discharge IMM give to:   (No DC IM needed, last given within 2 days)

## 2020-07-16 NOTE — Discharge Instructions (Signed)
Activity as tolerated    Diabetic diet

## 2020-07-16 NOTE — Care Plan (Signed)
Assessment:   Pt is a 65 yo male admitted s/p cardiac cath with aborted intervention. Pt demo'd L facial numbness and blurred vision- code stroke called. CT negative, awaiting one with contrast. Pt was pleasant and cooperative. Pt was alert and oriented. Pt was able to follow all commands. Pt IND to stand. Pt ambulated ~ 100 ft with IND, no LOB. Pt c/o L face tingling that persists, as well as L eye blurry vision, however does not impede his physical functioning. Therefore no acute OT warranted. Recommend discharge to home with follow up to neurological opthalmologist.    Goals:   No goals set at this time.    Patient and/or family Goals/Expectations:  "To go home." : Realistic    Plan:     TREATMENT PLAN/FREQUENCY:  No OT services required at this time.    D/C PLANS:   Recommend discharge to home with follow up to neurological opthalmologist.    OT Recommendations for Nursing:  Ambulate in hallway with nursing  OT Recommendations for Patient/Family:  Ambulate in hallway with nursing    Follow patient as appropriate according to established plan of care, progressing as tolerated.     The risks/benefits of therapy have been discussed with the patient and he/she is in agreement with the established plan of care.     Therapist :     Delford Field, OTR/L  07/16/2020, 11:07    Time may include review of chart notes, obtaining patient's functional history from patient/family/medical staff/case management/ancillary personnel, collaboration on findings and treatment options (with the above mentioned individuals), re-assessment, and acute care rehabilitation.
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
## 2020-07-16 NOTE — Care Plan (Signed)
Discharge instructions given to pt. He verbalized understanding and voiced no questions  Problem: Adult Inpatient Plan of Care  Goal: Plan of Care Review  Outcome: Adequate for Discharge  Goal: Patient-Specific Goal (Individualized)  Outcome: Adequate for Discharge  Goal: Absence of Hospital-Acquired Illness or Injury  Outcome: Adequate for Discharge  Goal: Optimal Comfort and Wellbeing  Outcome: Adequate for Discharge  Goal: Rounds/Family Conference  Outcome: Adequate for Discharge     Problem: Adjustment to Illness (Stroke, Ischemic/Transient Ischemic Attack)  Goal: Optimal Coping  Outcome: Adequate for Discharge     Problem: Bowel Elimination Impaired (Stroke, Ischemic/Transient Ischemic Attack)  Goal: Effective Bowel Elimination  Outcome: Adequate for Discharge     Problem: Cerebral Tissue Perfusion (Stroke, Ischemic/Transient Ischemic Attack)  Goal: Optimal Cerebral Tissue Perfusion  Outcome: Adequate for Discharge     Problem: Cognitive Impairment (Stroke, Ischemic/Transient Ischemic Attack)  Goal: Optimal Cognitive Function  Outcome: Adequate for Discharge     Problem: Communication Impairment (Stroke, Ischemic/Transient Ischemic Attack)  Goal: Improved Communication Skills  Outcome: Adequate for Discharge     Problem: Functional Ability Impaired (Stroke, Ischemic/Transient Ischemic Attack)  Goal: Optimal Functional Ability  Outcome: Adequate for Discharge     Problem: Respiratory Compromise (Stroke, Ischemic/Transient Ischemic Attack)  Goal: Effective Oxygenation and Ventilation  Outcome: Adequate for Discharge     Problem: Sensorimotor Impairment (Stroke, Ischemic/Transient Ischemic Attack)  Goal: Improved Sensorimotor Function  Outcome: Adequate for Discharge     Problem: Eating/Swallowing Impairment (Stroke, Ischemic/Transient Ischemic Attack)  Goal: Oral Intake without Aspiration  Outcome: Adequate for Discharge     Problem: Urinary Elimination Impaired (Stroke, Ischemic/Transient Ischemic Attack)  Goal:  Effective Urinary Elimination  Outcome: Adequate for Discharge     Problem: Fall Injury Risk  Goal: Absence of Fall and Fall-Related Injury  07/16/2020 1332 by Barbaraann Faster, RN  Outcome: Adequate for Discharge  07/16/2020 0943 by Barbaraann Faster, RN  Outcome: Ongoing (see interventions/notes)  Intervention: Identify and Manage Contributors  Flowsheets (Taken 07/16/2020 0943)  Self-Care Promotion:   independence encouraged   BADL personal objects within reach  Medication Review/Management: medications reviewed  Intervention: Promote Injury-Free Environment  Flowsheets (Taken 07/16/2020 [phone])  Safety Promotion/Fall Prevention:   activity supervised   nonskid shoes/slippers when out of bed

## 2020-07-16 NOTE — Care Plan (Signed)
Assessment     Assessment:  Patient is moving well.  He appears to be back to baseline with the exception of "tingling" in the left side of his face.  Patient performed transfers with SBA.  Gait was with SBA of 1. No assistive device was used--no LOB noted. Patient was able to clear the left foot without his brace.  Patient encouraged to wear his brace as he has been.  At this time there are no acute PT needs.  anticipate that the patient will go home without any further PT needs    [id number] [phone]   History No personal factors or co-morbidities 1-2 personal factors or co-morbidities 3 or more personal factors or co-morbidities   Examination of Body Systems Addressing 1-2 elements Addressing a total of 3 or more elements Addressing a total of 4 or more elements   Clinical Presentation Stable Evolving Unstable   Typical Face-to-Face Time (minutes) 20 30 45   Clinical Decision Making (Complexity) Low Moderate High            Goals:        Physical therapy SHORT TERM goals:  N/A as there are no acute PT needs     Physical therapy LONG TERM goals:  N/A As there are no acute PT needs  Patient and/or family Goals/Expectations:  Wants to go home: Realistic     Plan:       Patient to be seen: DC as there are no acute PT needs     PT Recommendations for Nursing:  Ambulate with assistance   PT Recommendations for Patient/Family::  Call for assistance when getting OOB  Hedy Jacob, PT
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
## 2020-07-16 NOTE — Nurses Notes (Signed)
Discharge instructions given to pt. He verbalized understanding and voiced no questions. Pt was discharged to home via wheelchair to a private vehicle driven by a friend. All personal belongings found in pt. Room was sent home with pt.

## 2020-07-23 NOTE — Procedures (Signed)
CARDIOLOGY [facility] [place], [facility]  [address] [name]  [place] [address]  [phone]       Date: 06/27/2020  Patient Name: [name]  MRN#: [id number]  DOB: 1955-07-18    Remote ILR Device Interrogation    Model/SN Medtronic [id number]  Implant Date Sep 14, 2017  Loop Recorder    Patient's ILR was checked by remote access.     31 day analysis was performed of patient's implantable loop recorder, including analysis of programmed parameters, battery, heart rhythm, counters, any observation summaries, and alerts.  Please see programmer printout for further details (scanned document).    Battery     OK    Events/Episodes     None    Diagnostic Observations     No new observations.      Follow up in 1 month, remote.

## 2020-07-27 ENCOUNTER — Telehealth (HOSPITAL_COMMUNITY): Payer: Self-pay | Admitting: Cardiovascular Disease

## 2020-07-27 NOTE — Telephone Encounter (Signed)
Post call made to [facility]. No answer, message was left with call back details.

## 2020-07-28 ENCOUNTER — Encounter (INDEPENDENT_AMBULATORY_CARE_PROVIDER_SITE_OTHER): Payer: Medicare PPO

## 2020-07-28 DIAGNOSIS — Z4509 Encounter for adjustment and management of other cardiac device: Secondary | ICD-10-CM

## 2020-07-29 ENCOUNTER — Telehealth (HOSPITAL_COMMUNITY): Payer: Self-pay | Admitting: Cardiovascular Disease

## 2020-07-29 ENCOUNTER — Other Ambulatory Visit (INDEPENDENT_AMBULATORY_CARE_PROVIDER_SITE_OTHER): Payer: Self-pay | Admitting: Cardiovascular Disease

## 2020-07-29 MED ORDER — RANOLAZINE ER 500 MG TABLET,EXTENDED RELEASE,12 HR
500.0000 mg | ORAL_TABLET | Freq: Two times a day (BID) | ORAL | 1 refills | Status: DC
Start: 2020-07-29 — End: 2021-01-05

## 2020-07-29 NOTE — Telephone Encounter (Signed)
Voicemail left.

## 2020-08-03 ENCOUNTER — Encounter (INDEPENDENT_AMBULATORY_CARE_PROVIDER_SITE_OTHER): Payer: Self-pay

## 2020-08-04 ENCOUNTER — Telehealth (INDEPENDENT_AMBULATORY_CARE_PROVIDER_SITE_OTHER): Payer: Self-pay | Admitting: Cardiovascular Disease

## 2020-08-04 DIAGNOSIS — I2510 Atherosclerotic heart disease of native coronary artery without angina pectoris: Secondary | ICD-10-CM

## 2020-08-04 NOTE — Telephone Encounter (Signed)
-----   Message from Renata Caprice, MD sent at 08/04/2020 10:21 AM EDT -----  We can go ahead and place order for the second cath. Thank you!    ----- Message -----  From: Angelene Giovanni, RN  Sent: 08/04/2020  10:13 AM EDT  To: Renata Caprice, MD    Mr. Edwin [name] LVM to see when his next cath would be. He is concerned as he is still having symptoms and states his PCP believes he is going into heart failure (I do not have access to those notes, I can fax them for a copy). Patient has follow up scheduled with you 04/29 (next Friday). Was he to follow up first so the second cath could be ordered at that visit or would you like me to go ahead and place order for second cath?    Thank you!  Lauren    COURSE IN HOSPITAL:  This is a 65 y.o., male who was brought to the cath lab for [facility], coronary angio +/- angioplasty. Cath was significant for high grade ostial LCx stenosis. Pt tolerate the procedure well and there were no complications. Given the amount of dye and radiation needed for diagnostic pictures it was decided stop and bring patient back on a different day for planned LCX ostial PCI. Plan discussed with the pt and he seemed agreeable. Discharge to home on current cardiac meds.

## 2020-08-05 NOTE — Telephone Encounter (Signed)
Authorization information submitted. Awaiting determination. Once received will go to [facility] for schedule. LVM for patient making him aware.    Angelene Giovanni, RN  08/05/2020, 10:17

## 2020-08-07 ENCOUNTER — Other Ambulatory Visit (INDEPENDENT_AMBULATORY_CARE_PROVIDER_SITE_OTHER): Payer: Self-pay | Admitting: Cardiovascular Disease

## 2020-08-07 DIAGNOSIS — I639 Cerebral infarction, unspecified: Secondary | ICD-10-CM

## 2020-08-07 DIAGNOSIS — I679 Cerebrovascular disease, unspecified: Secondary | ICD-10-CM

## 2020-08-07 MED ORDER — CLOPIDOGREL 75 MG TABLET
75.0000 mg | ORAL_TABLET | Freq: Every day | ORAL | 1 refills | Status: DC
Start: 2020-08-07 — End: 2021-01-05

## 2020-08-13 ENCOUNTER — Other Ambulatory Visit (INDEPENDENT_AMBULATORY_CARE_PROVIDER_SITE_OTHER): Payer: Self-pay | Admitting: Cardiovascular Disease

## 2020-08-13 ENCOUNTER — Encounter (INDEPENDENT_AMBULATORY_CARE_PROVIDER_SITE_OTHER): Payer: Self-pay | Admitting: Cardiovascular Disease

## 2020-08-13 ENCOUNTER — Telehealth (HOSPITAL_COMMUNITY): Payer: Self-pay | Admitting: Cardiovascular Disease

## 2020-08-13 NOTE — Telephone Encounter (Signed)
Patient contacted, pre-op instructions given. Patient instructed to arrive at 0930, Take his Plavix and Aspirin. Continue with insulin pump. NPO status reviewed. Questions/concerns addressed

## 2020-08-14 ENCOUNTER — Encounter (INDEPENDENT_AMBULATORY_CARE_PROVIDER_SITE_OTHER): Payer: Medicare PPO | Admitting: Cardiovascular Disease

## 2020-08-18 ENCOUNTER — Observation Stay
Admission: RE | Admit: 2020-08-18 | Discharge: 2020-08-19 | Disposition: A | Discharge status: Home / Self Care | Payer: Medicare PPO | Source: Ambulatory Visit | Attending: INTERNAL MEDICINE | Admitting: INTERNAL MEDICINE

## 2020-08-18 ENCOUNTER — Encounter (HOSPITAL_COMMUNITY)
Admission: RE | Disposition: A | Discharge status: Home / Self Care | Payer: Self-pay | Source: Ambulatory Visit | Attending: Internal Medicine

## 2020-08-18 ENCOUNTER — Other Ambulatory Visit: Payer: Self-pay

## 2020-08-18 DIAGNOSIS — Z8673 Personal history of transient ischemic attack (TIA), and cerebral infarction without residual deficits: Secondary | ICD-10-CM | POA: Insufficient documentation

## 2020-08-18 DIAGNOSIS — Z7902 Long term (current) use of antithrombotics/antiplatelets: Secondary | ICD-10-CM | POA: Insufficient documentation

## 2020-08-18 DIAGNOSIS — E119 Type 2 diabetes mellitus without complications: Secondary | ICD-10-CM | POA: Insufficient documentation

## 2020-08-18 DIAGNOSIS — Z881 Allergy status to other antibiotic agents status: Secondary | ICD-10-CM

## 2020-08-18 DIAGNOSIS — T82855A Stenosis of coronary artery stent, initial encounter: Secondary | ICD-10-CM

## 2020-08-18 DIAGNOSIS — Z79899 Other long term (current) drug therapy: Secondary | ICD-10-CM | POA: Insufficient documentation

## 2020-08-18 DIAGNOSIS — Z885 Allergy status to narcotic agent status: Secondary | ICD-10-CM

## 2020-08-18 DIAGNOSIS — Z794 Long term (current) use of insulin: Secondary | ICD-10-CM | POA: Insufficient documentation

## 2020-08-18 DIAGNOSIS — Z8249 Family history of ischemic heart disease and other diseases of the circulatory system: Secondary | ICD-10-CM

## 2020-08-18 DIAGNOSIS — M47819 Spondylosis without myelopathy or radiculopathy, site unspecified: Secondary | ICD-10-CM | POA: Insufficient documentation

## 2020-08-18 DIAGNOSIS — I25119 Atherosclerotic heart disease of native coronary artery with unspecified angina pectoris: Secondary | ICD-10-CM

## 2020-08-18 DIAGNOSIS — E785 Hyperlipidemia, unspecified: Secondary | ICD-10-CM | POA: Insufficient documentation

## 2020-08-18 DIAGNOSIS — I2510 Atherosclerotic heart disease of native coronary artery without angina pectoris: Principal | ICD-10-CM | POA: Insufficient documentation

## 2020-08-18 DIAGNOSIS — Z7982 Long term (current) use of aspirin: Secondary | ICD-10-CM | POA: Insufficient documentation

## 2020-08-18 DIAGNOSIS — Z888 Allergy status to other drugs, medicaments and biological substances status: Secondary | ICD-10-CM

## 2020-08-18 DIAGNOSIS — Z9641 Presence of insulin pump (external) (internal): Secondary | ICD-10-CM | POA: Insufficient documentation

## 2020-08-18 DIAGNOSIS — R9431 Abnormal electrocardiogram [ECG] [EKG]: Secondary | ICD-10-CM

## 2020-08-18 DIAGNOSIS — I2584 Coronary atherosclerosis due to calcified coronary lesion: Secondary | ICD-10-CM

## 2020-08-18 DIAGNOSIS — I10 Essential (primary) hypertension: Secondary | ICD-10-CM | POA: Insufficient documentation

## 2020-08-18 LAB — BASIC METABOLIC PANEL
ANION GAP: 10 mmol/L (ref 4–13)
BUN/CREA RATIO: 27 — ABNORMAL HIGH (ref 6–22)
BUN: 25 mg/dL (ref 8–25)
CALCIUM: 9.2 mg/dL (ref 8.8–10.2)
CHLORIDE: 107 mmol/L (ref 96–111)
CO2 TOTAL: 27 mmol/L (ref 23–31)
CREATININE: 0.91 mg/dL (ref 0.75–1.35)
ESTIMATED GFR: 88 mL/min/BSA (ref >=60)
GLUCOSE: 191 mg/dL — ABNORMAL HIGH (ref 65–125)
POTASSIUM: 3.9 mmol/L (ref 3.5–5.1)
SODIUM: 144 mmol/L (ref 136–145)

## 2020-08-18 LAB — CBC
HCT: 42.5 % (ref 38.9–52.0)
HGB: 14.4 g/dL (ref 13.4–17.5)
MCH: 29.1 pg (ref 26.0–32.0)
MCHC: 33.9 g/dL (ref 31.0–35.5)
MCV: 86 fL (ref 78.0–100.0)
MPV: 9.7 fL (ref 8.7–12.5)
PLATELETS: 161 10*3/uL (ref 150–400)
RBC: 4.94 10*6/uL (ref 4.50–6.10)
RDW-CV: 13.4 % (ref 11.5–15.5)
WBC: 6 10*3/uL (ref 3.7–11.0)

## 2020-08-18 LAB — ECG 12-LEAD
Atrial Rate: 73 {beats}/min
Calculated P Axis: 33 degrees
Calculated R Axis: 21 degrees
PR Interval: 216 ms
QRS Duration: 86 ms
QT Interval: 418 ms
QTC Calculation: 460 ms
Ventricular rate: 73 {beats}/min

## 2020-08-18 LAB — POC FINGERSTICK GLUCOSE - BMC/JMC (RESULTS)
GLUCOSE, POC: 136 mg/dl — ABNORMAL HIGH (ref 60–100)
GLUCOSE, POC: 160 mg/dl — ABNORMAL HIGH (ref 60–100)
GLUCOSE, POC: 160 mg/dl — ABNORMAL HIGH (ref 60–100)

## 2020-08-18 SURGERY — CORONARY ARTERY DRUG ELUTING STENT - INITIAL VESSEL

## 2020-08-18 MED ORDER — ASPIRIN 81 MG TABLET,DELAYED RELEASE
81.0000 mg | DELAYED_RELEASE_TABLET | Freq: Every day | ORAL | Status: DC
Start: 2020-08-19 — End: 2020-08-19
  Administered 2020-08-19: 81 mg via ORAL
  Filled 2020-08-18: qty 1

## 2020-08-18 MED ORDER — ISOSORBIDE MONONITRATE ER 30 MG TABLET,EXTENDED RELEASE 24 HR
30.0000 mg | ORAL_TABLET | Freq: Every morning | ORAL | Status: DC
Start: 2020-08-19 — End: 2020-08-19
  Administered 2020-08-19: 30 mg via ORAL
  Filled 2020-08-18: qty 1

## 2020-08-18 MED ORDER — RANOLAZINE ER 500 MG TABLET,EXTENDED RELEASE,12 HR
500.0000 mg | ORAL_TABLET | Freq: Two times a day (BID) | ORAL | Status: DC
Start: 2020-08-18 — End: 2020-08-19
  Administered 2020-08-18 – 2020-08-19 (×2): 500 mg via ORAL
  Filled 2020-08-18 (×6): qty 1

## 2020-08-18 MED ORDER — MIDAZOLAM 1 MG/ML INJECTION SOLUTION
Freq: Once | INTRAMUSCULAR | Status: DC | PRN
Start: 2020-08-18 — End: 2020-08-18
  Administered 2020-08-18: 1 mg via INTRAVENOUS

## 2020-08-18 MED ORDER — DAPAGLIFLOZIN PROPANEDIOL 5 MG TABLET
10.0000 mg | ORAL_TABLET | Freq: Every day | ORAL | Status: DC
Start: 2020-08-19 — End: 2020-08-19
  Administered 2020-08-19: 10 mg via ORAL
  Filled 2020-08-18: qty 2

## 2020-08-18 MED ORDER — CLOPIDOGREL 75 MG TABLET
75.0000 mg | ORAL_TABLET | Freq: Every day | ORAL | Status: DC
Start: 2020-08-19 — End: 2020-08-19
  Administered 2020-08-19: 75 mg via ORAL
  Filled 2020-08-18: qty 1

## 2020-08-18 MED ORDER — HYDROXYZINE PAMOATE 50 MG CAPSULE
50.0000 mg | ORAL_CAPSULE | Freq: Every evening | ORAL | Status: DC | PRN
Start: 2020-08-18 — End: 2020-08-19
  Filled 2020-08-18 (×3): qty 1

## 2020-08-18 MED ORDER — ATORVASTATIN 40 MG TABLET
80.0000 mg | ORAL_TABLET | Freq: Every evening | ORAL | Status: DC
Start: 2020-08-18 — End: 2020-08-19
  Administered 2020-08-18: 80 mg via ORAL
  Filled 2020-08-18: qty 2

## 2020-08-18 MED ORDER — ASPIRIN 81 MG CHEWABLE TABLET
81.0000 mg | CHEWABLE_TABLET | Freq: Every day | ORAL | Status: DC
Start: 2020-08-19 — End: 2020-08-18

## 2020-08-18 MED ORDER — BIVALIRUDIN 5 MG/ML BOLUS FROM INFUSION
Freq: Once | INTRAVENOUS | Status: DC | PRN
Start: 2020-08-18 — End: 2020-08-18
  Administered 2020-08-18: 80.25 mg via INTRAVENOUS

## 2020-08-18 MED ORDER — LIDOCAINE HCL 10 MG/ML (1 %) INJECTION SOLUTION
Freq: Once | INTRAMUSCULAR | Status: DC | PRN
Start: 2020-08-18 — End: 2020-08-18
  Administered 2020-08-18: 4 mL via INTRADERMAL

## 2020-08-18 MED ORDER — SODIUM CHLORIDE 0.9 % INTRAVENOUS SOLUTION
INTRAVENOUS | Status: AC | PRN
Start: 2020-08-18 — End: 2020-08-18
  Administered 2020-08-18 (×2): 100 mL/h via INTRAVENOUS

## 2020-08-18 MED ORDER — INSULIN LISPRO 100 UNIT/ML INJECTION SSIP - CITY
1.0000 [IU] | Freq: Four times a day (QID) | SUBCUTANEOUS | Status: DC
Start: 2020-08-18 — End: 2020-08-19
  Administered 2020-08-18 – 2020-08-19 (×2): 0 [IU] via SUBCUTANEOUS
  Filled 2020-08-18: qty 300

## 2020-08-18 MED ORDER — HEPARIN (PORCINE) (PF) 2,000 UNIT/1,000 ML IN 0.9 % SODIUM CHLORIDE IV
Freq: Once | INTRAVENOUS | Status: DC | PRN
Start: 2020-08-18 — End: 2020-08-18
  Administered 2020-08-18: 300 mL

## 2020-08-18 MED ORDER — CLOPIDOGREL 75 MG TABLET
75.0000 mg | ORAL_TABLET | Freq: Every day | ORAL | Status: DC
Start: 2020-08-18 — End: 2020-08-18

## 2020-08-18 MED ORDER — NITROGLYCERIN 0.4 MG SUBLINGUAL TABLET
0.4000 mg | SUBLINGUAL_TABLET | SUBLINGUAL | Status: DC | PRN
Start: 2020-08-18 — End: 2020-08-19

## 2020-08-18 MED ORDER — BIVALIRUDIN 250 MG/50 ML (5 MG/ML) INTRAVENOUS SOLUTION
INTRAVENOUS | Status: DC | PRN
Start: 2020-08-18 — End: 2020-08-18
  Administered 2020-08-18: 0 via INTRAVENOUS
  Administered 2020-08-18: 1.75 mg/kg/h via INTRAVENOUS

## 2020-08-18 MED ORDER — IODIXANOL 320 MG IODINE/ML INTRAVENOUS SOLUTION
INTRAVENOUS | Status: AC
Start: 2020-08-18 — End: 2020-08-18
  Filled 2020-08-18: qty 200

## 2020-08-18 MED ORDER — HEPARIN (PORCINE) (PF) 1,000 UNIT/500 ML IN 0.9 % SODIUM CHLORIDE IV
INTRAVENOUS | Status: AC
Start: 2020-08-18 — End: 2020-08-18
  Filled 2020-08-18: qty 500

## 2020-08-18 MED ORDER — IODIXANOL 320 MG IODINE/ML INTRAVENOUS SOLUTION
Freq: Once | INTRAVENOUS | Status: DC | PRN
Start: 2020-08-18 — End: 2020-08-18
  Administered 2020-08-18 (×2): 170 mL via INTRAVENOUS

## 2020-08-18 MED ORDER — SODIUM CHLORIDE 0.9% FLUSH BAG - 250 ML
INTRAVENOUS | Status: DC | PRN
Start: 2020-08-18 — End: 2020-08-19

## 2020-08-18 MED ORDER — IODIXANOL 320 MG IODINE/ML INTRAVENOUS SOLUTION
INTRAVENOUS | Status: AC
Start: 2020-08-18 — End: 2020-08-18
  Filled 2020-08-18: qty 100

## 2020-08-18 MED ORDER — METOPROLOL TARTRATE 100 MG TABLET
100.0000 mg | ORAL_TABLET | Freq: Two times a day (BID) | ORAL | Status: DC
Start: 2020-08-18 — End: 2020-08-19
  Administered 2020-08-18 – 2020-08-19 (×2): 100 mg via ORAL
  Filled 2020-08-18 (×3): qty 1

## 2020-08-18 MED ORDER — FUROSEMIDE 20 MG TABLET
20.0000 mg | ORAL_TABLET | Freq: Every day | ORAL | Status: DC
Start: 2020-08-19 — End: 2020-08-19
  Administered 2020-08-19: 20 mg via ORAL
  Filled 2020-08-18: qty 1

## 2020-08-18 MED ORDER — CLOPIDOGREL 75 MG TABLET
ORAL_TABLET | ORAL | Status: AC
Start: 2020-08-18 — End: 2020-08-18
  Filled 2020-08-18: qty 1

## 2020-08-18 MED ORDER — PANTOPRAZOLE 40 MG TABLET,DELAYED RELEASE
40.0000 mg | DELAYED_RELEASE_TABLET | Freq: Every day | ORAL | Status: DC
Start: 2020-08-18 — End: 2020-08-19
  Administered 2020-08-18 – 2020-08-19 (×2): 40 mg via ORAL
  Filled 2020-08-18 (×2): qty 1

## 2020-08-18 MED ORDER — ONDANSETRON HCL (PF) 4 MG/2 ML INJECTION SOLUTION
4.0000 mg | Freq: Three times a day (TID) | INTRAMUSCULAR | Status: DC | PRN
Start: 2020-08-18 — End: 2020-08-19

## 2020-08-18 MED ORDER — CYANOCOBALAMIN (VIT B-12) 1,000 MCG TABLET
1000.0000 ug | ORAL_TABLET | Freq: Every day | ORAL | Status: DC
Start: 2020-08-19 — End: 2020-08-19
  Administered 2020-08-19: 1000 ug via ORAL
  Filled 2020-08-18: qty 1

## 2020-08-18 MED ORDER — CLOPIDOGREL 75 MG TABLET
75.0000 mg | ORAL_TABLET | ORAL | Status: AC
Start: 2020-08-18 — End: 2020-08-18
  Administered 2020-08-18: 75 mg via ORAL

## 2020-08-18 MED ORDER — DEXTROSE 50 % IN WATER (D50W) INTRAVENOUS SYRINGE
12.5000 g | INJECTION | INTRAVENOUS | Status: DC | PRN
Start: 2020-08-18 — End: 2020-08-19

## 2020-08-18 MED ORDER — CLOPIDOGREL 75 MG TABLET
ORAL_TABLET | Freq: Once | ORAL | Status: DC | PRN
Start: 2020-08-18 — End: 2020-08-18
  Administered 2020-08-18: 225 mg via ORAL

## 2020-08-18 MED ORDER — LIDOCAINE HCL 10 MG/ML (1 %) INJECTION SOLUTION
INTRAMUSCULAR | Status: AC
Start: 2020-08-18 — End: 2020-08-18
  Filled 2020-08-18: qty 50

## 2020-08-18 MED ORDER — DULOXETINE 30 MG CAPSULE,DELAYED RELEASE
60.0000 mg | DELAYED_RELEASE_CAPSULE | Freq: Every day | ORAL | Status: DC
Start: 2020-08-18 — End: 2020-08-19
  Administered 2020-08-18 – 2020-08-19 (×2): 60 mg via ORAL
  Filled 2020-08-18 (×2): qty 2

## 2020-08-18 MED ORDER — SODIUM CHLORIDE 0.9 % (FLUSH) INJECTION SYRINGE
10.0000 mL | INJECTION | INTRAMUSCULAR | Status: DC | PRN
Start: 2020-08-18 — End: 2020-08-19

## 2020-08-18 MED ORDER — HEPARIN (PORCINE) 5,000 UNIT/ML INJECTION SOLUTION
5000.0000 [IU] | Freq: Three times a day (TID) | INTRAMUSCULAR | Status: DC
Start: 2020-08-19 — End: 2020-08-19
  Administered 2020-08-19: 5000 [IU] via SUBCUTANEOUS
  Filled 2020-08-18: qty 1

## 2020-08-18 MED ORDER — MIDAZOLAM 1 MG/ML INJECTION WRAPPER
INTRAMUSCULAR | Status: AC
Start: 2020-08-18 — End: 2020-08-18
  Filled 2020-08-18: qty 2

## 2020-08-18 MED ORDER — ASPIRIN 81 MG CHEWABLE TABLET
81.0000 mg | CHEWABLE_TABLET | Freq: Every day | ORAL | Status: DC
Start: 2020-08-18 — End: 2020-08-18

## 2020-08-18 MED ORDER — ASPIRIN 81 MG CHEWABLE TABLET
81.0000 mg | CHEWABLE_TABLET | ORAL | Status: AC
Start: 2020-08-18 — End: 2020-08-18
  Administered 2020-08-18: 81 mg via ORAL

## 2020-08-18 MED ORDER — NITROGLYCERIN 50 MCG/ML INJECTION
INJECTION | INTRAVENOUS | Status: AC
Start: 2020-08-18 — End: 2020-08-18
  Filled 2020-08-18: qty 30

## 2020-08-18 MED ORDER — FENTANYL (PF) 50 MCG/ML INJECTION SOLUTION
Freq: Once | INTRAMUSCULAR | Status: DC | PRN
Start: 2020-08-18 — End: 2020-08-18
  Administered 2020-08-18: 50 ug via INTRAVENOUS
  Administered 2020-08-18: 25 ug via INTRAVENOUS

## 2020-08-18 MED ORDER — CETIRIZINE 1 MG/ML ORAL SOLUTION
10.0000 mg | Freq: Every day | ORAL | Status: DC
Start: 2020-08-18 — End: 2020-08-19
  Administered 2020-08-18 – 2020-08-19 (×2): 10 mg via ORAL
  Filled 2020-08-18 (×4): qty 10

## 2020-08-18 MED ORDER — FENTANYL (PF) 50 MCG/ML INJECTION SOLUTION
INTRAMUSCULAR | Status: AC
Start: 2020-08-18 — End: 2020-08-18
  Filled 2020-08-18: qty 2

## 2020-08-18 MED ORDER — HEPARIN (PORCINE) (PF) 2,000 UNIT/1,000 ML IN 0.9 % SODIUM CHLORIDE IV
INTRAVENOUS | Status: AC
Start: 2020-08-18 — End: 2020-08-18
  Filled 2020-08-18: qty 1000

## 2020-08-18 MED ORDER — BIVALIRUDIN 250 MG/50 ML (5 MG/ML) INTRAVENOUS SOLUTION
INTRAVENOUS | Status: AC
Start: 2020-08-18 — End: 2020-08-18
  Filled 2020-08-18: qty 100

## 2020-08-18 MED ORDER — HEPARIN (PORCINE) (PF) 1,000 UNIT/500 ML IN 0.9 % SODIUM CHLORIDE IV
INTRAVENOUS | Status: AC | PRN
Start: 2020-08-18 — End: 2020-08-18
  Administered 2020-08-18: 3 mL/h

## 2020-08-18 MED ORDER — PREGABALIN 50 MG CAPSULE
50.0000 mg | ORAL_CAPSULE | Freq: Three times a day (TID) | ORAL | Status: DC
Start: 2020-08-18 — End: 2020-08-19
  Administered 2020-08-18 – 2020-08-19 (×2): 50 mg via ORAL
  Filled 2020-08-18 (×2): qty 1

## 2020-08-18 MED ORDER — ASPIRIN 81 MG CHEWABLE TABLET
CHEWABLE_TABLET | ORAL | Status: AC
Start: 2020-08-18 — End: 2020-08-18
  Filled 2020-08-18: qty 1

## 2020-08-18 MED ORDER — SODIUM CHLORIDE 0.9 % (FLUSH) INJECTION SYRINGE
10.0000 mL | INJECTION | Freq: Three times a day (TID) | INTRAMUSCULAR | Status: DC
Start: 2020-08-18 — End: 2020-08-19
  Administered 2020-08-18 – 2020-08-19 (×2): 10 mL via INTRAVENOUS

## 2020-08-18 MED ORDER — CLOPIDOGREL 75 MG TABLET
75.0000 mg | ORAL_TABLET | Freq: Every day | ORAL | Status: DC
Start: 2020-08-19 — End: 2020-08-18

## 2020-08-18 MED ORDER — CLOPIDOGREL 75 MG TABLET
ORAL_TABLET | ORAL | Status: AC
Start: 2020-08-18 — End: 2020-08-18
  Filled 2020-08-18: qty 3

## 2020-08-18 SURGICAL SUPPLY — 32 items
ANGIO NAMIC PROTECT 72IN WASTE COLLECT FEMALE FIT STRAP CLIP CLS FLUID SYSTEM STRL LF (Cautery Accessories) ×1
ANGIO NAMIC PROTECT STTN PRESS_URIZE M RA ADPR SYSTEM STRL (Cautery Accessories) ×1
BALLOON 2.5MM 100MM 10MM WOLVERINE MNRL CUT BUMPER TIP MIDSHAFT RADOPQ ANGPLSTY ATH ZGLIDE (BALLOON) ×1 IMPLANT
CATH ANGIO 5FR 145D PGTL FL4 FR4 CURVE 100/110CM EXPO FULL LGTH WRE BRD RBST SHAFT VENTRIC TRILON (DIAGNOSTIC) ×1 IMPLANT
CATH ANGIO 5FR 145D PGTL FL4 F_R4 CURVE 100/110CM EXPO FULL (DIAGNOSTIC) ×2
CATH GD 6FR 100CM EBU3.5 CURVE LNCHR LRG LUM RADOPQ FLXB DIST SEG INNER JKT COR NYL PLMR STRL LF (GUIDING) ×1 IMPLANT
CATH GD 6FR 100CM EBU3.5 CURVE_LNCHR LRG LUM RADOPQ FLXB (GUIDING) ×2
CATH NC TREK 4MM 8MM RX RADOPQ SMOOTH RND TIP BAL DIL COR CROSSFLEX2 TUNG PTCA (BALLOON) ×1 IMPLANT
CATH NC TREK 4MM 8MM RX RADOPQ_SMTH RND TIP BAL DIL COR (BALLOON) ×1
CATH US EE PLAT 3.3-2.9FR 20MM 150CM 24CM 20MHZ RX LUM RADOPQ TAPER TIP GLYDX ACPT .014IN GW TRKBK (ULTRASOUND) ×1 IMPLANT
CATH US EE PLAT 3.3-2.9FR 20MM_150CM 24CM 20MHZ RX LUM (ULTRASOUND) ×1
CATH WOLVERINE CUT BAL MNRL 2._5MM 90MM 100CM 10MM BMPR TIP (BALLOON) ×1
CONV USE 338564 - PACK SURG ANGIO PRX STRL DISP LF (PVSU) ×1 IMPLANT
COR STENT XIENCE SRR 4MM 8MM RX EVEROLIMUS SYS ×1 IMPLANT
DEVICE CLSR ANGIOSEAL VIP 6FR 70CM HMST BIOABS INSERTION SHEATH GW CO-PLMR CLGN VAS STRL LF  DISP ×1 IMPLANT
DEVICE INFLAT INDFLTR 35.5CM DGT 25 ATM ANGPLSTY (MISCELLANEOUS PT CARE ITEMS) ×1
DEVICE INFLAT INDFLTR 35.5CM D_GT 25 ATM ANGPLSTY (MISCELLANEOUS PT CARE ITEMS) ×1
GW RUNTHROUGH .36MM 3CM 180CM RADOPQ X FLPY VAS STRL LF  DISP PTCA (GUIDING) ×2 IMPLANT
GW RUNTHROUGH NS .014IN 3CM 18_CM ATRM X FLPY TIP RADOPQ (GUIDING) ×4
KIT ANGIO ANGIOTOUCH HAND CONTROLLR STOPCOCK TUBE STRL DISP (Other Miscellaneous) ×1 IMPLANT
KIT ANGIO ANGIOTOUCH HAND CONT_ROLLR STOPCOCK TUBING STRL (Other Miscellaneous) ×1
KIT MANIFLD SAL CNRST PORT SPIKE TUBE HAND SYRG PRESS TRANSDUC CART ACIST CVI DISP (Connecting Tubes/Misc) ×1
KIT MICROINTRO MNSTCK 7CM MAX 4FR 21GA TUNG NITINOL COAX STIFFEN GW ECHGN NEEDLE STRL DISP (NEEDLES & SYRINGE SUPPLIES) ×1
KIT MICROINTRO MNSTCK 7CM MAX_4FR 21GA TUNG NITINOL COAX (NEEDLES & SYRINGE SUPPLIES) ×1
KIT MNFLD SAL CNRST PORT SPIKE_TUBE HAND SYRG PRESS TRANSDUC (Connecting Tubes/Misc) ×1
KIT SYRG ACIST CVI AUTOMATE MU_A2000 REUSE (Other Miscellaneous) ×1
KIT SYRG AUTOMATE MU A2000 REUSE (Other Miscellaneous) ×1 IMPLANT
PACK ANGIOGRAPHY_CS/5 (PVSU) ×1
SHEATH INTROD 10CM 6FR PINN .035IN PERI SS SNAPON DIL LOCK KINK RST SMOOTH TRNS SPRG COIL GW 2.5CM (INTRODUCER) ×1 IMPLANT
SHEATH INTROD PINNACLE 6FRX10_10/BX RSS601 (INTRODUCER) ×1
VALVE BLEED BACK CO PILOT_1003331 (CARDIAC) ×1
VALVE HMSTS .096IN COPLT 1 UNIT BLDBCK CONTROL STRL DISP (CARDIAC) ×1 IMPLANT

## 2020-08-18 NOTE — Nurses Notes (Signed)
Pt glucose was tested. Glucose reading of 136. Pt denied needs at this time. VSS.

## 2020-08-18 NOTE — Care Plan (Signed)
Problem: Adult Inpatient Plan of Care  Goal: Plan of Care Review  Outcome: Ongoing (see interventions/notes)  Goal: Patient-Specific Goal (Individualized)  Outcome: Ongoing (see interventions/notes)  Goal: Absence of Hospital-Acquired Illness or Injury  Outcome: Ongoing (see interventions/notes)  Intervention: Identify and Manage Fall Risk  Flowsheets (Taken 08/18/2020 2235)  Safety Promotion/Fall Prevention:   safety round/check completed   nonskid shoes/slippers when out of bed   fall prevention program maintained  Intervention: Prevent Skin Injury  Flowsheets (Taken 08/18/2020 2235)  Body Position: positioned/repositioned independently  Intervention: Prevent and Manage VTE (Venous Thromboembolism) Risk  Flowsheets (Taken 08/18/2020 2235)  VTE Prevention/Management: anticoagulant therapy maintained  Intervention: Prevent Infection  Flowsheets (Taken 08/18/2020 2235)  Infection Prevention: rest/sleep promoted  Goal: Optimal Comfort and Wellbeing  Outcome: Ongoing (see interventions/notes)  Intervention: Monitor Pain and Promote Comfort  Note: Monitor and treat pain as needed  Intervention: Provide Person-Centered Care  Flowsheets (Taken 08/18/2020 2235)  Trust Relationship/Rapport:   thoughts/feelings acknowledged   questions encouraged  Goal: Rounds/Family Conference  Outcome: Ongoing (see interventions/notes)     Problem: Arrhythmia/Dysrhythmia (Cardiac Catheterization)  Goal: Stable Heart Rate and Rhythm  Outcome: Ongoing (see interventions/notes)     Problem: Bleeding (Cardiac Catheterization)  Goal: Absence of Bleeding  Outcome: Ongoing (see interventions/notes)     Problem: Contrast-Induced Injury Risk (Cardiac Catheterization)  Goal: Absence of Contrast-Induced Injury  Outcome: Ongoing (see interventions/notes)     Problem: Embolism (Cardiac Catheterization)  Goal: Absence of Embolism Signs and Symptoms  Outcome: Ongoing (see interventions/notes)     Problem: Ongoing Anesthesia/Sedation Effects (Cardiac  Catheterization)  Goal: Anesthesia/Sedation Recovery  Outcome: Ongoing (see interventions/notes)     Problem: Pain (Cardiac Catheterization)  Goal: Acceptable Pain Control  Outcome: Ongoing (see interventions/notes)     Problem: Vascular Access Protection (Cardiac Catheterization)  Goal: Absence of Vascular Access Complication  Outcome: Ongoing (see interventions/notes)

## 2020-08-18 NOTE — ACP (Advance Care Planning) (Signed)
Advance Care Planning     An advance care planning face-to-face discussion was held with patient at bedside .     The discussion included:   Current situation, further goals and plans in regards to CAD.   Code Status: FULL CODE   As per patient's wishes, person to make medical decision for patient if patient were not able to for any reason ? Brother.   All questions were answered.        The time of the advance care planning discussion with completion of any forms as a result of this discussion does not include or overlap with time spent on any other component of this encounter.    Total time of face-to-face counseling for advance care planning: 16 minutes.    Flint Melter, MD , 08/18/2020

## 2020-08-18 NOTE — Nurses Notes (Signed)
Report received from [place], [place]. Pt is resting and denies needs. Glucose will be checked around 1200. Pt has call bell in reach and VSS.
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
## 2020-08-18 NOTE — Nurses Notes (Signed)
Report called to [place], [employer] fund[facility]. Pt is to be taken to 6th floor at 420.

## 2020-08-18 NOTE — Nurses Notes (Signed)
Pt is in cath holding room 3. Pt is in gown and admission is complete. Labs have been drawn and EKG is at the bed side. Pt asked for a urinal but denies any other needs at this time. Pt has call bell and is awaiting consent. VSS. Will continue to monitor.

## 2020-08-18 NOTE — Nurses Notes (Signed)
Per Dr. Howie Ill insulin pump is to remain off for now. Pts glucose will be checked as needed.

## 2020-08-18 NOTE — Nurses Notes (Signed)
Insulin pump disconnected by patient. Placed at the bedside. Patient stable. Will review labs and address any issues with glucose etc. Denies any c/o at this time
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
## 2020-08-18 NOTE — Nurses Notes (Signed)
Report received from [place], [place]. Pt is resting and is back on monitor. Glucose was tested and is currently 147. Pts femoral site has a small amount of blood under the dressing but is not actively bleeding. Site has been check several times since returning to floor. Pt is lying flat. Pt was told when he is able to sit up 30 degrees he may eat and he can reattach his insulin pump at that time.

## 2020-08-18 NOTE — Nurses Notes (Signed)
Admitted to 613. Came from cath lab. Right groin site is dressed and shows old blood denies any pain. Tele SR.

## 2020-08-18 NOTE — Nurses Notes (Signed)
Pt taken to 6th floor via bed. Virl Diamond, RN at bedside. Femoral site check by both nurses. Small amount of old blood noted, no hematoma noted at this time. Pt being placed back on monitors. Virl Diamond, RN still at bedside.

## 2020-08-19 DIAGNOSIS — Z7902 Long term (current) use of antithrombotics/antiplatelets: Secondary | ICD-10-CM

## 2020-08-19 DIAGNOSIS — E119 Type 2 diabetes mellitus without complications: Secondary | ICD-10-CM

## 2020-08-19 DIAGNOSIS — Z8673 Personal history of transient ischemic attack (TIA), and cerebral infarction without residual deficits: Secondary | ICD-10-CM

## 2020-08-19 DIAGNOSIS — Z79899 Other long term (current) drug therapy: Secondary | ICD-10-CM

## 2020-08-19 DIAGNOSIS — I10 Essential (primary) hypertension: Secondary | ICD-10-CM

## 2020-08-19 DIAGNOSIS — Z7982 Long term (current) use of aspirin: Secondary | ICD-10-CM

## 2020-08-19 DIAGNOSIS — Z794 Long term (current) use of insulin: Secondary | ICD-10-CM

## 2020-08-19 DIAGNOSIS — E785 Hyperlipidemia, unspecified: Secondary | ICD-10-CM

## 2020-08-19 DIAGNOSIS — Z955 Presence of coronary angioplasty implant and graft: Secondary | ICD-10-CM

## 2020-08-19 DIAGNOSIS — I2510 Atherosclerotic heart disease of native coronary artery without angina pectoris: Secondary | ICD-10-CM

## 2020-08-19 LAB — CBC WITH DIFF
BASOPHIL #: <0.1 10*3/uL (ref <=0.20)
BASOPHIL %: 1 %
EOSINOPHIL #: 0.29 10*3/uL (ref <=0.50)
EOSINOPHIL %: 5 %
HCT: 43.6 % (ref 38.9–52.0)
HGB: 14.2 g/dL (ref 13.4–17.5)
IMMATURE GRANULOCYTE #: <0.1 10*3/uL (ref <0.10)
IMMATURE GRANULOCYTE %: 0 % (ref 0–1)
LYMPHOCYTE #: 2.04 10*3/uL (ref 1.00–4.80)
LYMPHOCYTE %: 33 %
MCH: 28.5 pg (ref 26.0–32.0)
MCHC: 32.6 g/dL (ref 31.0–35.5)
MCV: 87.4 fL (ref 78.0–100.0)
MONOCYTE #: 0.48 10*3/uL (ref 0.20–1.10)
MONOCYTE %: 8 %
MPV: 9.9 fL (ref 8.7–12.5)
NEUTROPHIL #: 3.38 10*3/uL (ref 1.50–7.70)
NEUTROPHIL %: 53 %
PLATELETS: 156 10*3/uL (ref 150–400)
RBC: 4.99 10*6/uL (ref 4.50–6.10)
RDW-CV: 13.3 % (ref 11.5–15.5)
WBC: 6.3 10*3/uL (ref 3.7–11.0)

## 2020-08-19 LAB — COMPREHENSIVE METABOLIC PANEL, NON-FASTING
ALBUMIN: 3.5 g/dL (ref 3.4–4.8)
ALKALINE PHOSPHATASE: 72 U/L (ref 45–115)
ALT (SGPT): 37 U/L (ref 10–55)
ANION GAP: 6 mmol/L (ref 4–13)
AST (SGOT): 29 U/L (ref 8–45)
BILIRUBIN TOTAL: 5.3 mg/dL — ABNORMAL HIGH (ref 0.3–1.3)
BUN/CREA RATIO: 27 — ABNORMAL HIGH (ref 6–22)
BUN: 22 mg/dL (ref 8–25)
CALCIUM: 8.8 mg/dL (ref 8.8–10.2)
CHLORIDE: 107 mmol/L (ref 96–111)
CO2 TOTAL: 27 mmol/L (ref 23–31)
CREATININE: 0.83 mg/dL (ref 0.75–1.35)
ESTIMATED GFR: >90 mL/min/BSA (ref >=60)
GLUCOSE: 179 mg/dL — ABNORMAL HIGH (ref 65–125)
POTASSIUM: 5.2 mmol/L — ABNORMAL HIGH (ref 3.5–5.1)
PROTEIN TOTAL: 6.2 g/dL (ref 6.0–8.0)
SODIUM: 140 mmol/L (ref 136–145)

## 2020-08-19 LAB — POC FINGERSTICK GLUCOSE - BMC/JMC (RESULTS): GLUCOSE, POC: 107 mg/dl — ABNORMAL HIGH (ref 60–100)

## 2020-08-19 LAB — PHOSPHORUS: PHOSPHORUS: 3.5 mg/dL (ref 2.3–4.0)

## 2020-08-19 LAB — MAGNESIUM: MAGNESIUM: 2.1 mg/dL (ref 1.8–2.6)
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
## 2020-08-19 NOTE — Nurses Notes (Signed)
Patient discharged home with family.  AVS reviewed with patient/care giver.  A written copy of the AVS and discharge instructions was given to the patient/care giver.  Questions sufficiently answered as needed.  Patient/care giver encouraged to follow up with PCP as indicated.  In the event of an emergency, patient/care giver instructed to call 911 or go to the nearest emergency room. Follow up appointments and discharge medications reviewed with pt and pt verbalized understanding. Post cath/groin care instructions reviewed with pt and he verbalized understanding. Right groin is soft and stable; no hematoma or bruit noted. IV removed, catheter intact. Tele removed. Pt taken off unit via wheel chair by nursing staff.

## 2020-08-19 NOTE — Nurses Notes (Signed)
Patient alert and oriented x4 this shift. C/O generalized discomfort to right groin but not enough for analgesic. Right groin site is unchanged. Old blood noted on the dressing. Home Insulin pump is in place to abdomen. No safety concerns noted. Bed in lowest position and call light within reach. Able to make needs known to staff. Ambulates to bathroom independently.

## 2020-08-19 NOTE — Progress Notes (Signed)
[facility]  Progress Note  Date/Time: 08/19/2020 08:54  Patient Name: Edwin [name]  MRN#: [id number]  DOB: Oct 30, 1955  Admission Date: 08/18/2020   Consulting Physician: Creta Levin, MD  Consulting Cardiologist: Dr. Julian Hy    Subjective:   Has ambulated to restroom without chest tightness/pain.   Ready to go home.     Physical Exam:     Vitals:    08/18/20 2100 08/19/20 0004 08/19/20 0411 08/19/20 0700   BP: (!) 142/72 137/69 125/66 (!) 114/52   Pulse: 73 74 80 75   Resp:  _0 Temp:  36.3 C (97.3 F) 36.6 C (97.9 F) 36.3 C (97.4 F)   SpO2:  98% 95% 96%   Weight:       Height:       BMI:             Body mass index is 35.88 kg/m.    Intake and Output Summary (Last 24 hours) at Date Time    Intake/Output Summary (Last 24 hours) at 08/19/2020 0854  Last data filed at 08/19/2020 0410  Gross per 24 hour   Intake --   Output 900 ml   Net -900 ml       Physical Exam  Vitals and nursing note reviewed.   Constitutional:       General: He is not in acute distress.     Appearance: Normal appearance.   HENT:      Head: Normocephalic and atraumatic.      Nose: Nose normal.   Cardiovascular:      Rate and Rhythm: Normal rate and regular rhythm.      Pulses: Intact distal pulses.      Heart sounds: Normal heart sounds, S1 normal and S2 normal.      Comments: No LE edema.  Pulmonary:      Effort: Pulmonary effort is normal.      Breath sounds: Normal breath sounds.   Skin:     General: Skin is warm and dry.      Capillary Refill: Capillary refill takes less than 2 seconds.      Comments: Right groin intact, no hematoma, no bruit. 2+ right DP and PT pulses   Neurological:      Mental Status: He is alert and oriented to person, place, and time.   Psychiatric:         Mood and Affect: Mood normal.         Behavior: Behavior normal.         Thought Content: Thought content normal.         Judgment: Judgment normal.              Medications:     Current Facility-Administered Medications   Medication Dose  Route Frequency   . aspirin (ECOTRIN) enteric coated tablet 81 mg  81 mg Oral Daily   . atorvastatin (LIPITOR) tablet  80 mg Oral QPM   . cetirizine (ZYRTEC) 78m per mL oral liquid  10 mg Oral Daily   . clopidogrel (PLAVIX) 75 mg tablet  75 mg Oral Daily   . cyanocobalamin (VITAMIN B12) tablet  1,000 mcg Oral Daily   . dapagliflozin (FARXIGA) tablet  10 mg Oral Daily   . SSIP insulin lispro (HUMALOG) 100 units/mL SubQ pen  1-12 Units Subcutaneous 4x/day AC    And   . dextrose 50% (0.5 g/mL) injection - syringe  12.5 g Intravenous Q15 Min PRN   .  DULoxetine (CYMBALTA) delayed release capsule  60 mg Oral Daily   . furosemide (LASIX) tablet  20 mg Oral Daily   . heparin 5,000 unit/mL injection  5,000 Units Subcutaneous Q8HRS   . hydrOXYzine pamoate (VISTARIL) capsule  50 mg Oral HS PRN   . isosorbide mononitrate (IMDUR) 24 hr extended release tablet  30 mg Oral QAM   . metoprolol tartrate (LOPRESSOR) tablet  100 mg Oral 2x/day   . nitroGLYCERIN (NITROSTAT) sublingual tablet  0.4 mg Sublingual Q5 Min PRN   . NS 250 mL flush bag   Intravenous Q1H PRN   . NS flush syringe  10 mL Intravenous Q8HRS   . NS flush syringe  10 mL Intravenous Q1H PRN   . ondansetron (ZOFRAN) 2 mg/mL injection  4 mg Intravenous Q8H PRN   . pantoprazole (PROTONIX) delayed release tablet  40 mg Oral Daily   . pregabalin (LYRICA) capsule  50 mg Oral 3x/day   . ranolazine (RANEXA) extended release tablet  500 mg Oral 2x/day     Prior to Admission medications    Medication Sig Start Date End Date Taking? Authorizing Provider   acetaminophen (TYLENOL) 500 mg Oral Tablet Take 500 mg by mouth Every 4 hours as needed for Pain   Yes Provider, Historical   aspirin (ECOTRIN) 81 mg Oral Tablet, Delayed Release (E.C.) Take 1 Tablet (81 mg total) by mouth Once a day 06/05/19  Yes Jhawar, Manish B, MD   atorvastatin (LIPITOR) 80 mg Oral Tablet TAKE 1 TABLET(80 MG) BY MOUTH EVERY EVENING 08/13/20  Yes Shelah Lewandowsky, APRN   calcium carbonate (CALCIUM 300 ORAL) Take  1,000 mg by mouth Once a day   Yes Provider, Historical   cetirizine (ZYRTEC) 10 mg Oral Tablet Take 10 mg by mouth Once a day   Yes Provider, Historical   cholecalciferol, vitamin D3, 1,000 unit Oral Tablet Take 1,000 Units by mouth Every 7 days   Yes Provider, Historical   clopidogreL (PLAVIX) 75 mg Oral Tablet Take 1 Tablet (75 mg total) by mouth Once a day 08/07/20  Yes Shelah Lewandowsky, APRN   cyanocobalamin (VITAMIN B 12) 1,000 mcg Oral Tablet Take 1,000 mcg by mouth Once a day   Yes Provider, Historical   dapagliflozin (FARXIGA) 10 mg Oral Tablet Take 10 mg by mouth Once a day   Yes Provider, Historical   DULoxetine (CYMBALTA DR) 60 mg Oral Capsule, Delayed Release(E.C.) Take 1 Capsule (60 mg total) by mouth Once a day 06/05/19  Yes Jhawar, Manish B, MD   ergocalciferol, vitamin D2, (DRISDOL) 1,250 mcg (50,000 unit) Oral Capsule Take 50,000 Units by mouth Every 7 days   Yes Provider, Historical   flash glucose sensor (FREESTYLE LIBRE 14 DAY SENSOR) Does not apply Kit Apply as indicated on Packaging. 07/16/20  Yes Garwin Brothers, MD   fluticasone propionate (FLONASE) 50 mcg/actuation Nasal Spray, Suspension Administer 1 Spray into each nostril Once a day   Yes Provider, Historical   furosemide (LASIX) 20 mg Oral Tablet Take 20 mg by mouth Once a day   Yes Provider, Historical   HUMALOG U-100 INSULIN 100 unit/mL Subcutaneous Solution 100 Units by Subcutaneous route Once a day 11/14/19  Yes Provider, Historical   hydrOXYzine pamoate (VISTARIL) 50 mg Oral Capsule Take 50 mg by mouth Every night   Yes Provider, Historical   isosorbide mononitrate (IMDUR) 30 mg Oral Tablet Sustained Release 24 hr Take 1 Tablet (30 mg total) by mouth Every morning 06/26/20  Yes Jhawar, Manish B,  MD   lidocaine (LIDODERM) 5 % Adhesive Patch, Medicated Place 1 Patch on the skin Once a day   Yes Provider, Historical   magnesium oxide,aspartate,citr 400 mg magnesium Oral Capsule Take by mouth Once a day   Yes Provider, Historical   metoprolol  tartrate (LOPRESSOR) 100 mg Oral Tablet Take 1 Tablet (100 mg total) by mouth Twice daily 06/18/20  Yes Jhawar, Manish B, MD   pantoprazole (PROTONIX) 40 mg Oral Tablet, Delayed Release (E.C.) Take 40 mg by mouth Once a day   Yes Provider, Historical   pregabalin (LYRICA) 50 mg Oral Capsule Take 50 mg by mouth Three times a day    Yes Provider, Historical   ranolazine (RANEXA) 500 mg Oral Tablet Sustained Release 12 hr Take 1 Tablet (500 mg total) by mouth Twice daily 07/29/20  Yes Shelah Lewandowsky, APRN   VASCEPA 1 gram Oral Capsule Take 2 g by mouth Twice daily 06/13/19  Yes Provider, Historical   zinc sulfate (ZINC-15 ORAL) Take by mouth Once a day   Yes Provider, Historical   atorvastatin (LIPITOR) 80 mg Oral Tablet Take 1 Tablet (80 mg total) by mouth Every evening 06/17/19 08/13/20  Eben Burow B, MD   clopidogreL (PLAVIX) 75 mg Oral Tablet Take 1 Tablet (75 mg total) by mouth Once a day 09/10/19 08/07/20  Margarita Grizzle, APRN-FNP-BC   ranolazine (RANEXA) 500 mg Oral Tablet Sustained Release 12 hr Take 1 Tablet (500 mg total) by mouth Twice daily 05/29/20 07/29/20  Fortino Sic, MD       Labs Reviewed:     Recent Labs     08/18/20  0948 08/19/20  0541   SODIUM 144 140   POTASSIUM 3.9 5.2*   CO2 27 27   BUN 25 22   CREATININE 0.91 0.83     Recent Labs     08/19/20  0541   MAGNESIUM 2.1     Recent Labs     08/18/20  0948 08/19/20  0541   WBC 6.0 6.3   HGB 14.4 14.2   HCT 42.5 43.6   PLTCNT 161 156     Recent Labs     08/19/20  0541   AST 29   ALT 37     No results found for this encounter  No results found for this encounter  No results found for this encounter  Recent Results (from the past 14000 hour(s))   LIPID PANEL    Collection Time: 07/15/20  3:02 PM   Result Value    CHOLESTEROL  108    HDL CHOL 27 (L)    TRIGLYCERIDES 303 (H)    LDL CALC 20    VLDL CALC 61 (H)    NON-HDL 81    CHOL/HDL RATIO 4.0    Narrative    Icterus can alter results at this level (mild).     Personally reviewed by Shelah Lewandowsky,  APRN    Rads:          Personally reviewed by Shelah Lewandowsky, APRN    Cardiovascular Workup:     LHC 10/21/11 (Dr. Gery Pray)  1. Prior right coronary stent which remains patent with minimal luminal encroachment.  2. 3-vessel atherosclerosis without significant obstructive disease.  3. Normal overall systolic function with focal inferior basal hypokinesis  RECOMMENDATIONS: Medical management with vigorous secondary prevention.    Echo 10/16/12  1. The left ventricle is thickened in a fashion consistent with mild concentric hypertrophy. Global systolic function was normal. The  left ventricular ejection fraction is estimated to be 55-60%.   2. Diffuse thickening (sclerosis) without reduced excursion in the aortic valve. There is evidence of trivial (trace) aortic regurgitation.   3. There is evidence of trivial (trace) mitral regurgitation. There is evidence of sclerosis of the mitral valve.   4. There is evidence of trace (trivial) tricuspid regurgitation. The estimated right ventricular systolic pressure is 18 mmHg.   5. There is no evidence of pericardial effusion.   6. There is no previous echocardiogram available for comparison.     ABI/PVR 10/16/12  1. Normal bilateral ABIs at rest and post exercise.   2. Limited PVRs at ankles and toe pressures are normal.    Nuclear stress 12/03/12  1. LV systolic function appears moderately reduced   2. Calculated LVEF 38%   3. Gated images show moderate global hypokinesis.   4. Perfusion images show a large area of ischemia from base to distal in the infero-lateral wall with atleast moderate reversibility.  5. High risk study due to size of defect and reduced EF.    LHC 12/13/12  1. 99% proximal OM2 stenosis status post percutaneous balloon angioplasty followed by drug-eluting stent placement, Xience 3.25 x 23 mm.  2. Prior stents in RCA and proximal left circumflex noted to be patent.    Nuclear stress 03/18/13  1. Comparing the baseline and the stress images, no significant fixed  or reversible perfusion defect noted.  2. Gated imaging shows normal EF.  3. Low risk Lexiscan nuclear stress test.    LHC 03/19/13  1. Patent stents in the RCA and circumflex marginal.  2. Significant 1st diagonal branch disease.  3. Normal LV systolic and diastolic function.    Nuclear stress 04/12/14  Unremarkable Lexiscan Cardiolite stress test without evidence for infarct or ischemia, and visually normal-appearing left ventricular ejection fraction.    Sleep study 08/20/15  1. Moderate sleep apnea with significant desaturations.  2. RECOMMENDATION: For the patient to undergo a full night CPAP titration for the moderate sleep apnea with desaturations.    Echo 03/07/17  1.The left ventricular cavity size is normal. The left ventricular wall thicknesses are normal. The leftventricular systolic function is normal. The left ventricular ejection fraction is normal, estimated at 55-60%.There are no regional wall motion abnormalities present. There is normal diastolic function.  2.No patent foramen ovale is demonstrated by agitated saline injection.   3.There is no significant valvular heart disease.    Event monitor 04/24/17- 05/25/17  No symptoms reports. There were strips showing sinus rhythm with PVC. There are no strips to review.    Sleep study 06/15/2017  Severe sleep apnea. Normalizing with a continuous positive airway pressure of 8.    Nuclear stress 07/25/17  Baseline EKG shows NSR with T wave abnormality. Lexiscan EKG shows no significant EKG changes from baseline.  Perfusion images show a small fixed defect of mild severity in the mid inferior wall with no clinically significant ischemia seen and normal wall motion analysis. SSS 1, SDS 0.  Ejection Fraction: 56%  Abnormal, but low risk study.  Compared to prior study report from 11/19/2016, images now do not show any ischemia.    ABI/PVR 08/09/17  Resting and post exercise ABI suggestive of no significant arterial insufficiency in b/l LE. Toe  pressures adequate for healing.    30 Day Event Monitor 07/2017  I have several strips for review, which show sinus tachycardia with PVC and sinus tachycardia.These were all auto-triggered events with no  reported symptoms.There is some  baseline artifact on the strips as well.I have no other strips for review. No high-grade arrhythmias seen as detailed above.    Echo 09/06/18  1: The left ventricular cavity size, wall thickness, and systolic function are normal with no regional wall motion abnormalities present. The left ventricular ejection fraction is normal, estimated at 55-60%. There is normal diastolic function.   2: There is no significant valvular heart disease.     Echo 11/01/19  The left ventricular cavity size, wall thickness, and systolic function are normal with no regional wall motion abnormalities present. The left ventricular ejection fraction is normal, estimated at 55-60%. There is normal diastolic function.     Nuclear stress test 11/01/19   Risk Assessment: Low   Small, focal, mild apical lateral perfusion defect; it is reversible, although the presence of gut uptake on resting images makes    interpratation difficult. At most this represents a subtle degree of low risk ischemia.   Normal left ventricular systolic function.      Device check 10/16/18-04/26/20  - No events/ episodes    LHC 07/15/2020  Postprocedure diagnosis:  More than 70% ostial left circumflex stenosis  Patent prior stents in RCA, left circumflex and LAD with mild-to-moderate InStent restenoses  Plan:  Given the amount of dye and radiation needed for diagnostic pictures it was decided stop and bring patient back on a different day for planned LCX ostial PCI. Plan discussed with the pt and he seemed agreeable.    Echo 07/16/2020  Normal left ventricular size.  The left ventricular ejection fraction by visual assessment is estimated to be 50-55%, probably closer to 50%.  Bubble study of limited quality but overall negative for  intracardiac shunt.  No significant valvular heart disease.    LHC 08/11/2020  Preprocedure diagnosis:  Symptoms of worsening angina with known high-grade ostial left circumflex stenosis  Postprocedure diagnosis:  High-grade ostial left circumflex stenosis status post angioplasty with a cutting balloon followed by drug-eluting stent placement, Xience 4.0 x 8 mm which was post dilated with a 4.0 noncompliant balloon    Telemetry strips: NSR    Personally reviewed by Shelah Lewandowsky, APRN      Problem List:   Principal Problem:    CAD (coronary artery disease)       Assessment:     Edwin [name] is 65 y.o. male with     1. CAD. Sx ofCP concerning for worsening angina. S/p PCI with DES to high grade ostial LCx.  2. Hx of TIA  3. HTN  4. HLD  5. DM-II  6. H/O Rosanna Randy syndrome      Plan:     1. Continue DAPT, Aspirin 81 mg daily and Plavix 75 mg daily  2. Continue Atorvastatin 80 mg daily  3. Continue Metoprolol 25 mg BID  4. Continue Imdur and Ranexa.   5. Counseled on importance of medication compliance.   6. Recommend cardiac rehab  7. Ambulate patient in hall  8. Mild elevation in K this am (5.2), not on ACE/ARB. Recommend repeat BMP prior to follow up appointment with Dr. Ellery Plunk outpatient.   9. Okay for discharge today  10. Follow up with Dr. Ellery Plunk outpatient in 1-2 weeks.       Thank you for allowing [place] to participate in the care of your patient. Please feel free to contact us if further questions arise.    Shelah Lewandowsky, APRN  [facility]  I independently of the faculty provider spent a total of (30) minutes in direct care of this patient including initial evaluation, review of laboratory, radiology, diagnostic studies, review of medical record, order entry and coordination of care.    Portions of this note may be dictated using voice recognition software or a dictation service. Variances in spelling and vocabulary are possible and unintentional. Not all errors are caught/corrected. Please notify  the [facility] if any discrepancies are noted, or if the meaning of any statement is not clear.       Attending physician Attestation:  Patient seen and examined.   Agree with plan as aforementioned.     Thank you for consulting [facility] for the care of this patient.   Will follow while inpatient and call us as further questions arise.    Dr. Julian Hy MD  ABIM Board Certified In Cardiovascular Disease  ABIM Board Certified In Internal Medicine  [facility], [address]  Ph: [phone]  Ph: [phone]  (cell): [phone]

## 2020-08-19 NOTE — Discharge Summary (Signed)
[facility]    DISCHARGE SUMMARY      PATIENT NAME:  Edwin [name], Edwin [name]  MRN:  [id number]  DOB:  1956-01-04    ADMISSION DATE:  08/18/2020  DISCHARGE DATE:  08/19/2020    ATTENDING PHYSICIAN: Creta Levin, MD  PRIMARY CARE PHYSICIAN: Jacqulynn Cadet, MD     ADMISSION DIAGNOSIS: CAD (coronary artery disease)  DISCHARGE DIAGNOSIS:   Active Hospital Problems    Diagnosis Date Noted   . Principle Problem: CAD (coronary artery disease) [I25.10] 05/29/2020   . 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     Active Non-Hospital Problems    Diagnosis Date Noted   . TIA (transient ischemic attack) 07/15/2020   . 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      Details   acetaminophen 500 mg Tablet  Commonly known as: TYLENOL   500 mg, Oral, EVERY 4 HOURS PRN  Refills: 0     aspirin 81 mg Tablet, Delayed Release (E.C.)  Commonly known as: ECOTRIN   81 mg, Oral, DAILY  Refills: 0     atorvastatin 80 mg Tablet  Commonly known as: LIPITOR   TAKE 1 TABLET(80 MG) BY MOUTH EVERY EVENING  Qty: 90 Tablet  Refills: 1     CALCIUM 300 ORAL   1,000 mg, Oral, DAILY  Refills: 0     cetirizine 10 mg Tablet  Commonly known as: ZYRTEC   10 mg, Oral, DAILY  Refills: 0     cholecalciferol (vitamin D3) 25 mcg (1,000 unit) Tablet   1,000 Units, Oral, EVERY 7 DAYS  Refills: 0     clopidogreL 75 mg Tablet  Commonly known as: PLAVIX   75 mg, Oral, DAILY  Qty: 90 Tablet  Refills: 1     cyanocobalamin 1,000 mcg Tablet  Commonly known as: VITAMIN B 12   1,000 mcg, Oral, DAILY  Refills: 0     dapagliflozin 10 mg Tablet  Commonly known as: FARXIGA   10 mg, Oral, DAILY  Refills: 0     DULoxetine 60 mg Capsule, Delayed Release(E.C.)  Commonly known as: CYMBALTA DR   60 mg, Oral, DAILY  Refills: 0     ergocalciferol (vitamin D2) 1,250 mcg (50,000 unit)  Capsule  Commonly known as: DRISDOL   50,000 Units, Oral, EVERY 7 DAYS  Refills: 0     fluticasone propionate 50 mcg/actuation Spray, Suspension  Commonly known as: FLONASE   1 Spray, Each Nostril, DAILY  Refills: 0     furosemide 20 mg Tablet  Commonly known as: LASIX   20 mg, Oral, DAILY  Refills: 0     HumaLOG U-100 Insulin 100 unit/mL Solution  Generic drug: insulin lispro   100 Units, Subcutaneous, DAILY  Refills: 0     hydrOXYzine pamoate 50 mg Capsule  Commonly known as: VISTARIL   50 mg, Oral, NIGHTLY  Refills: 0     isosorbide mononitrate 30 mg Tablet Sustained Release 24 hr  Commonly known as: IMDUR   30 mg, Oral, EVERY MORNING  Qty: 90 Tablet  Refills: 1     lidocaine 5 % Adhesive Patch, Medicated  Commonly known as: LIDODERM   1 Patch, Transdermal, DAILY  Refills: 0     magnesium oxide,aspartate,citr 400 mg magnesium Capsule  Oral, DAILY  Refills: 0     metoprolol tartrate 100 mg Tablet  Commonly known as: LOPRESSOR   100 mg, Oral, 2 TIMES DAILY  Qty: 180 Tablet  Refills: 1     pantoprazole 40 mg Tablet, Delayed Release (E.C.)  Commonly known as: PROTONIX   40 mg, Oral, DAILY  Refills: 0     pregabalin 50 mg Capsule  Commonly known as: LYRICA   50 mg, Oral, 3 TIMES DAILY  Refills: 0     ranolazine 500 mg Tablet Sustained Release 12 hr  Commonly known as: Ranexa   500 mg, Oral, 2 TIMES DAILY  Qty: 180 Tablet  Refills: 1     Vascepa 1 gram Capsule  Generic drug: icosapent ethyL   2 g, Oral, 2 TIMES DAILY  Refills: 0     ZINC-15 ORAL   Oral, DAILY  Refills: 0        STOP taking these medications.    FreeStyle Libre 14 Day Sensor Kit  Generic drug: flash glucose sensor          DISCHARGE INSTRUCTIONS:   No discharge procedures on file.   Follow-up Information     Jacqulynn Cadet, MD In 2 weeks.    Specialty: FAMILY PRACTICE  Why: Discharge follow up  Contact information:  [address]  [place] [address]  [phone]             Julian Hy, MD.    Specialties: CARDIOVASCULAR DISEASE,  CARDIOLOGY  Contact information:  [address]  [place] [address]  [phone]                            [place] COURSE:  This is a 65 y.o., male with past medical history significant for history of stroke, type 2 diabetes, hyperlipidemia, hypertension who presents with coronary artery disease, underwent PCI and stent placement, continue with dual anti-platelet treatment, beta-blocker, statin and follow up with Cardiology as an outpatient.    Objective     Physical exam :     BP (!) 114/52   Pulse 75   Temp 36.3 C (97.4 F)   Resp 19   Ht 1.727 m (_0 )   Wt 107 kg (236 lb)   SpO2 96%   BMI 35.88 kg/m         General Appearance: No acute distress  HEENT: Normocephalic. PERRL. No nasal discharge. Neck Supple.   Cardiac: Normal rate and rhythm, No murmurs appreciated, There is no peripheral edema or cyanosis.   Lung: Clear to auscultation without rales, rhonchi, or wheezing.   Abdomen: Positive bowel sounds. Soft, Non-distended, Non-tender, No guarding or rebound. No masses.   Musculoskeletal: No joint deformity, erythema or tenderness. Full ROM  Neurological: Normal motor, sensory, and mental status on exam. Reflexes 2+ throughout.   Skin: Normal color, intact, no rashes.  Psychiatric: Awake, alert and cooperative. Normal affect.     Results for orders placed or performed during the hospital encounter of 08/18/20 (from the past 24 hour(s))   CBC/DIFF    Narrative    The following orders were created for panel order CBC/DIFF.  Procedure                               Abnormality         Status                     ---------                               -----------         ------  CBC WITH KAJG[[id number]]                                    Final result                 Please view results for these tests on the individual orders.   COMPREHENSIVE METABOLIC PANEL, NON-FASTING   Result Value Ref Range    SODIUM 140 136 - 145 mmol/L     POTASSIUM 5.2 (H) 3.5 - 5.1 mmol/L    CHLORIDE 107 96 - 111 mmol/L    CO2 TOTAL 27 23 - 31 mmol/L    ANION GAP 6 4 - 13 mmol/L    BUN 22 8 - 25 mg/dL    CREATININE 0.83 0.75 - 1.35 mg/dL    BUN/CREA RATIO 27 (H) 6 - 22    ESTIMATED GFR >90 >=60 mL/min/BSA    ALBUMIN 3.5 3.4 - 4.8 g/dL     CALCIUM 8.8 8.8 - 10.2 mg/dL    GLUCOSE 179 (H) 65 - 125 mg/dL    ALKALINE PHOSPHATASE 72 45 - 115 U/L    ALT (SGPT) 37 10 - 55 U/L    AST (SGOT)  29 8 - 45 U/L    BILIRUBIN TOTAL 5.3 (H) 0.3 - 1.3 mg/dL    PROTEIN TOTAL 6.2 6.0 - 8.0 g/dL   MAGNESIUM   Result Value Ref Range    MAGNESIUM 2.1 1.8 - 2.6 mg/dL   PHOSPHORUS   Result Value Ref Range    PHOSPHORUS 3.5 2.3 - 4.0 mg/dL   CBC WITH DIFF   Result Value Ref Range    WBC 6.3 3.7 - 11.0 x10^3/uL    RBC 4.99 4.50 - 6.10 x10^6/uL    HGB 14.2 13.4 - 17.5 g/dL    HCT 43.6 38.9 - 52.0 %    MCV 87.4 78.0 - 100.0 fL    MCH 28.5 26.0 - 32.0 pg    MCHC 32.6 31.0 - 35.5 g/dL    RDW-CV 13.3 11.5 - 15.5 %    PLATELETS 156 150 - 400 x10^3/uL    MPV 9.9 8.7 - 12.5 fL    NEUTROPHIL % 53 %    LYMPHOCYTE % 33 %    MONOCYTE % 8 %    EOSINOPHIL % 5 %    BASOPHIL % 1 %    NEUTROPHIL # 3.38 1.50 - 7.70 x10^3/uL    LYMPHOCYTE # 2.04 1.00 - 4.80 x10^3/uL    MONOCYTE # 0.48 0.20 - 1.10 x10^3/uL    EOSINOPHIL # 0.29 <=0.50 x10^3/uL    BASOPHIL # <0.10 <=0.20 x10^3/uL    IMMATURE GRANULOCYTE % 0 0 - 1 %    IMMATURE GRANULOCYTE # <0.10 <0.10 x10^3/uL   POC FINGERSTICK GLUCOSE - [facility] (RESULTS)   Result Value Ref Range    GLUCOSE, POC 160 (H) 60 - 100 mg/dl   POC FINGERSTICK GLUCOSE - [facility] (RESULTS)   Result Value Ref Range    GLUCOSE, POC 160 (H) 60 - 100 mg/dl   POC FINGERSTICK GLUCOSE - [facility] (RESULTS)   Result Value Ref Range    GLUCOSE, POC 107 (H) 60 - 100 mg/dl       ECG 12-LEAD    Result Date: 08/18/2020  Sinus rhythm with 1st degree A-V block Prolonged QT Abnormal ECG When compared with ECG of 15-Jul-2020 14:51, Premature ventricular complexes are no longer Present Premature atrial complexes  are no longer Present Confirmed by Arlina Robes [phone]) on 08/18/2020 1:28:24 PM    INVASIVE CARDIOLOGY PROCEDURE    Result Date: 08/18/2020  Preprocedure diagnosis: Symptoms of worsening angina with known high-grade ostial left circumflex stenosis Postprocedure diagnosis: High-grade ostial left circumflex stenosis status post angioplasty with a cutting balloon followed by drug-eluting stent placement, Xience 4.0 x 8 mm which was post dilated with a 4.0 noncompliant balloon           CONDITION ON DISCHARGE: Alert, Oriented and VS Stable    DISCHARGE DISPOSITION:  Home discharge     AVS reviewed with patient/care giver.  A written copy of the AVS and discharge instructions given to the patient/care giver. Patient/Family was in agreement, endorsed understanding, and all questions were answered. Patient/care giver encouraged to follow up with PCP as indicated.  In the event of an emergency, patient/care giver instructed to call 911 or go to the nearest emergency room.     cc: Primary Care Physician:  Jacqulynn Cadet, MD  [address]  [place] [address]     [id number] Physician:  No referring provider defined for this encounter.     Time spent on disposition: Approximately 45-55 minutes.     Portions of this note may be dictated using voice recognition software or a dictation service. Variances in spelling and vocabulary are possible and unintentional. Not all errors are caught/corrected. Please notify the [facility] if any discrepancies are noted or if the meaning of any statement is not clear.     Creta Levin, MD

## 2020-08-20 NOTE — Procedures (Signed)
CARDIOLOGY [facility] HEART & VASCULAR [place], [facility]  [address] [name]  [place] [address]  [phone]       Date: 07/28/2020  Patient Name: Edwin [name]  MRN#: [id number]  DOB: Mar 27, 1956    Remote ILR Device Interrogation    Model/SN Medtronic [id number]  Implant Date Sep 14, 2017  Loop Recorder    Patient's ILR was checked by remote access.     31 day analysis was performed of patient's implantable loop recorder, including analysis of programmed parameters, battery, heart rhythm, counters, any observation summaries, and alerts.  Please see programmer printout for further details (scanned document).    Battery     OK    Events/Episodes     1 tachy episode, EGM attached.    Diagnostic Observations     No new observations.      Follow up in 1 month, remote.

## 2020-08-26 ENCOUNTER — Encounter (INDEPENDENT_AMBULATORY_CARE_PROVIDER_SITE_OTHER): Payer: Medicare PPO | Admitting: Cardiovascular Disease

## 2020-08-28 ENCOUNTER — Encounter (INDEPENDENT_AMBULATORY_CARE_PROVIDER_SITE_OTHER): Payer: Self-pay

## 2020-09-17 ENCOUNTER — Encounter (INDEPENDENT_AMBULATORY_CARE_PROVIDER_SITE_OTHER): Payer: Self-pay | Admitting: NEUROSURGERY

## 2020-09-17 ENCOUNTER — Ambulatory Visit (RURAL_HEALTH_CENTER): Payer: Self-pay | Admitting: Primary Podiatric Medicine
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
## 2020-09-24 ENCOUNTER — Encounter (INDEPENDENT_AMBULATORY_CARE_PROVIDER_SITE_OTHER): Payer: Medicare PPO | Admitting: Cardiovascular Disease

## 2020-09-28 ENCOUNTER — Encounter (INDEPENDENT_AMBULATORY_CARE_PROVIDER_SITE_OTHER): Payer: Medicare PPO

## 2020-09-28 DIAGNOSIS — Z4509 Encounter for adjustment and management of other cardiac device: Secondary | ICD-10-CM

## 2020-09-28 DIAGNOSIS — Z95818 Presence of other cardiac implants and grafts: Secondary | ICD-10-CM

## 2020-10-29 ENCOUNTER — Encounter (INDEPENDENT_AMBULATORY_CARE_PROVIDER_SITE_OTHER): Payer: Medicare PPO

## 2020-10-29 DIAGNOSIS — Z95818 Presence of other cardiac implants and grafts: Secondary | ICD-10-CM

## 2020-10-29 DIAGNOSIS — Z4509 Encounter for adjustment and management of other cardiac device: Secondary | ICD-10-CM

## 2020-12-04 NOTE — Procedures (Signed)
CARDIOLOGY [facility] [place], [facility]  [address]  [place] [address]  [phone]       Date: 09/28/2020  Patient Name: Edwin [name]  MRN#: [id number]  DOB: [birth year]  Device Interrogation        Device Check 12/04/2020    General  Battery   Location Remote  Estimated Remaining Longevity (mm/yy)     Model Medtronic  Impedance   Serial Number [id number]  Atrial     Device Type ILR  RV     Implant Date 09/14/17  LV     Rhythm  RV / HVB     Presenting    SVC / HVX     Underlying    Programmed Outputs   Pacing  Atrial     Atrial (%)    RV     RV (%)    LV     LV (%)    Episodes / Events   Sensing Threshold  PVC Burden (%)     Atrial (mV)    Atrial none   RV (mV)    Ventricular none   LV (mV)    Comments / Details   Pacing Threshold  Diagnostic Observations     Atrial (mV)    Comments / Changes     RV (mV)    Actionable Items     LV (mV)    Remote Capability yes   Device Settings  Most Recent Echo     Mode    Most Recent EF     Rate Response    Most Recent BiV Optimization     Upper Limit        Lower Limit        PAV / SAV (ms)        VT Zone (bpm)        VF Zone (bpm)

## 2020-12-11 NOTE — Procedures (Signed)
CARDIOLOGY [facility] [place], [facility]  [address]  [place] [address]  [phone]       Date: 10/29/2020  Patient Name: Edwin [name]  MRN#: [id number]  DOB: 02-Dec-1955  Device Interrogation        Device Check 12/11/2020    General  Battery   Location Remote  Estimated Remaining Longevity (mm/yy)     Model Medtronic  Impedance   Serial Number [id number]  Atrial     Device Type ILR  RV     Implant Date 09/14/17  LV     Rhythm  RV / HVB     Presenting    SVC / HVX     Underlying    Programmed Outputs   Pacing  Atrial     Atrial (%)    RV     RV (%)    LV     LV (%)    Episodes / Events   Sensing Threshold  PVC Burden (%)     Atrial (mV)    Atrial none   RV (mV)    Ventricular none   LV (mV)    Comments / Details   Pacing Threshold  Diagnostic Observations none   Atrial (mV)    Comments / Changes     RV (mV)    Actionable Items     LV (mV)    Remote Capability yes   Device Settings  Most Recent Echo     Mode    Most Recent EF     Rate Response    Most Recent BiV Optimization     Upper Limit        Lower Limit        PAV / SAV (ms)        VT Zone (bpm)        VF Zone (bpm)

## 2020-12-23 ENCOUNTER — Other Ambulatory Visit (HOSPITAL_COMMUNITY): Payer: Self-pay

## 2020-12-23 LAB — EXTERNAL COVID-19 MOLECULAR RESULT: External 2019-n-CoV/SARS-CoV-2: POSITIVE — AB

## 2020-12-30 ENCOUNTER — Encounter (INDEPENDENT_AMBULATORY_CARE_PROVIDER_SITE_OTHER): Payer: Medicare PPO

## 2020-12-30 DIAGNOSIS — Z4509 Encounter for adjustment and management of other cardiac device: Secondary | ICD-10-CM

## 2020-12-30 DIAGNOSIS — Z95818 Presence of other cardiac implants and grafts: Secondary | ICD-10-CM

## 2021-01-05 ENCOUNTER — Other Ambulatory Visit (INDEPENDENT_AMBULATORY_CARE_PROVIDER_SITE_OTHER): Payer: Self-pay | Admitting: Family

## 2021-01-05 ENCOUNTER — Encounter (INDEPENDENT_AMBULATORY_CARE_PROVIDER_SITE_OTHER): Payer: Self-pay

## 2021-01-05 DIAGNOSIS — I679 Cerebrovascular disease, unspecified: Secondary | ICD-10-CM

## 2021-01-05 DIAGNOSIS — I639 Cerebral infarction, unspecified: Secondary | ICD-10-CM

## 2021-01-12 NOTE — Procedures (Signed)
CARDIOLOGY [facility] [place], [facility]  [address]  [place] [address]  [phone]       Date: 12/30/2020  Patient Name: Edwin [name]  MRN#: [id number]  DOB: 01-10-1956  Device Interrogation        Device Check 01/12/2021    General  Battery   Location Remote  Estimated Remaining Longevity (mm/yy)     Model Medtronic  Impedance   Serial Number [id number]  Atrial     Device Type ILR  RV     Implant Date 09/14/17  LV     Rhythm  RV / HVB     Presenting    SVC / HVX     Underlying    Programmed Outputs   Pacing  Atrial     Atrial (%)    RV     RV (%)    LV     LV (%)    Episodes / Events   Sensing Threshold  PVC Burden (%)     Atrial (mV)    Atrial none   RV (mV)    Ventricular none   LV (mV)    Comments / Details   Pacing Threshold  Diagnostic Observations none   Atrial (mV)    Comments / Changes     RV (mV)    Actionable Items     LV (mV)    Remote Capability yes   Device Settings  Most Recent Echo     Mode    Most Recent EF     Rate Response    Most Recent BiV Optimization     Upper Limit        Lower Limit        PAV / SAV (ms)        VT Zone (bpm)        VF Zone (bpm)

## 2021-01-15 ENCOUNTER — Other Ambulatory Visit (INDEPENDENT_AMBULATORY_CARE_PROVIDER_SITE_OTHER): Payer: Self-pay | Admitting: Family

## 2021-01-15 NOTE — Telephone Encounter (Signed)
This patient has not been seen for an office visit since 05/29/2020 with instructions to "follow up after LHC".     Results for Edwin [name], Edwin [name] (MRN [id number]) as of 01/15/2021 14:59   Ref. Range 08/19/2020 05:41   SODIUM Latest Ref Range: 136 - 145 mmol/L 140   POTASSIUM Latest Ref Range: 3.5 - 5.1 mmol/L 5.2 (H)   CHLORIDE Latest Ref Range: 96 - 111 mmol/L 107   CARBON DIOXIDE Latest Ref Range: 23 - 31 mmol/L 27   BUN Latest Ref Range: 8 - 25 mg/dL 22   CREATININE Latest Ref Range: 0.75 - 1.35 mg/dL 9.93   GLUCOSE Latest Ref Range: 65 - 125 mg/dL 716 (H)   ANION GAP Latest Ref Range: 4 - 13 mmol/L 6   BUN/CREAT RATIO Latest Ref Range: 6 - 22  27 (H)   ESTIMATED GLOMERULAR FILTRATION RATE Latest Ref Range: >=60 mL/min/BSA >90   CALCIUM Latest Ref Range: 8.8 - 10.2 mg/dL 8.8   MAGNESIUM Latest Ref Range: 1.8 - 2.6 mg/dL 2.1   PHOSPHORUS Latest Ref Range: 2.3 - 4.0 mg/dL 3.5   Results for Edwin [name], Edwin [name] (MRN [id number]) as of 01/15/2021 14:59   Ref. Range 08/19/2020 05:41   TOTAL PROTEIN Latest Ref Range: 6.0 - 8.0 g/dL 6.2   ALBUMIN Latest Ref Range: 3.4 - 4.8 g/dL  3.5   BILIRUBIN, TOTAL Latest Ref Range: 0.3 - 1.3 mg/dL 5.3 (H)   AST (SGOT) Latest Ref Range: 8 - 45 U/L 29   ALT (SGPT) Latest Ref Range: 10 - 55 U/L 37   ALKALINE PHOSPHATASE Latest Ref Range: 45 - 115 U/L 72     Results for Edwin [name], Edwin [name] (MRN [id number]) as of 01/15/2021 14:59   Ref. Range 07/15/2020 15:02   CHOLESTEROL Latest Ref Range: 100 - 200 mg/dL 852   HDL-CHOLESTEROL Latest Ref Range: >=50 mg/dL 27 (L)   LDL (CALCULATED) Latest Ref Range: <100 mg/dL 20   TRIGLYCERIDES Latest Ref Range: <150 mg/dL 778 (H)   VLDL (CALCULATED) Latest Ref Range: <30 mg/dL 61 (H)   CHOL/HDL RATIO Unknown 4.0   NON - HDL (CALCULATED) Latest Ref Range: <=190 mg/dL 81     Zella Richer, RN  01/15/2021, 15:05

## 2021-01-18 ENCOUNTER — Other Ambulatory Visit
Admission: RE | Admit: 2021-01-18 | Discharge: 2021-01-18 | Disposition: A | Discharge status: Home / Self Care | Payer: No Typology Code available for payment source | Source: Ambulatory Visit | Attending: Gastroenterology | Admitting: Gastroenterology

## 2021-01-18 LAB — VH APTIMA SARS-COV-2 ASSAY (PANTHER SYSTEM)(TM)
Aptima SARS-CoV-2: NEGATIVE
Does patient have symptoms related to condition of interest?: NEGATIVE
Does patient reside in a congregate care setting?: NEGATIVE
Is patient admitted to the intensive care unit?: NEGATIVE
Is patient employed in a healthcare setting?: NEGATIVE
Is the patient hospitalized because of this condition?: NEGATIVE
Is the patient pregnant?: NEGATIVE

## 2021-01-20 ENCOUNTER — Ambulatory Visit (HOSPITAL_BASED_OUTPATIENT_CLINIC_OR_DEPARTMENT_OTHER): Payer: No Typology Code available for payment source | Admitting: Anesthesiology

## 2021-01-20 ENCOUNTER — Encounter (HOSPITAL_BASED_OUTPATIENT_CLINIC_OR_DEPARTMENT_OTHER): Payer: Self-pay | Admitting: Gastroenterology

## 2021-01-20 ENCOUNTER — Encounter (HOSPITAL_BASED_OUTPATIENT_CLINIC_OR_DEPARTMENT_OTHER)
Admission: RE | Disposition: A | Discharge status: Home / Self Care | Payer: Self-pay | Source: Ambulatory Visit | Attending: Gastroenterology

## 2021-01-20 ENCOUNTER — Ambulatory Visit
Admission: RE | Admit: 2021-01-20 | Discharge: 2021-01-20 | Disposition: A | Discharge status: Home / Self Care | Payer: No Typology Code available for payment source | Source: Ambulatory Visit | Attending: Gastroenterology | Admitting: Gastroenterology

## 2021-01-20 DIAGNOSIS — R1310 Dysphagia, unspecified: Secondary | ICD-10-CM | POA: Insufficient documentation

## 2021-01-20 DIAGNOSIS — I10 Essential (primary) hypertension: Secondary | ICD-10-CM | POA: Insufficient documentation

## 2021-01-20 DIAGNOSIS — E1140 Type 2 diabetes mellitus with diabetic neuropathy, unspecified: Secondary | ICD-10-CM | POA: Insufficient documentation

## 2021-01-20 DIAGNOSIS — Z8673 Personal history of transient ischemic attack (TIA), and cerebral infarction without residual deficits: Secondary | ICD-10-CM | POA: Insufficient documentation

## 2021-01-20 DIAGNOSIS — I25110 Atherosclerotic heart disease of native coronary artery with unstable angina pectoris: Secondary | ICD-10-CM | POA: Insufficient documentation

## 2021-01-20 HISTORY — PX: EGD: SHX3789

## 2021-01-20 SURGERY — DONT USE, USE 1095-ESOPHAGOGASTRODUODENOSCOPY (EGD), DIAGNOSTIC
Anesthesia: Anesthesia MAC / Sedation | Site: Abdomen | Wound class: Clean Contaminated

## 2021-01-20 MED ORDER — SODIUM CHLORIDE (PF) 0.9 % IJ SOLN
3.0000 mL | Freq: Two times a day (BID) | INTRAMUSCULAR | Status: DC
Start: 2021-01-20 — End: 2021-01-20

## 2021-01-20 MED ORDER — ONDANSETRON HCL 4 MG/2ML IJ SOLN
4.0000 mg | Freq: Once | INTRAMUSCULAR | Status: DC | PRN
Start: 2021-01-20 — End: 2021-01-20

## 2021-01-20 MED ORDER — PROPOFOL 200 MG/20ML IV EMUL
INTRAVENOUS | Status: DC | PRN
Start: 2021-01-20 — End: 2021-01-20
  Administered 2021-01-20: 40 mg via INTRAVENOUS
  Administered 2021-01-20: 100 mg via INTRAVENOUS

## 2021-01-20 MED ORDER — PROPOFOL 200 MG/20ML IV EMUL
INTRAVENOUS | Status: AC
Start: 2021-01-20 — End: ?
  Filled 2021-01-20: qty 40

## 2021-01-20 MED ORDER — ALBUTEROL SULFATE (2.5 MG/3ML) 0.083% IN NEBU
2.5000 mg | INHALATION_SOLUTION | RESPIRATORY_TRACT | Status: DC | PRN
Start: 2021-01-20 — End: 2021-01-20

## 2021-01-20 MED ORDER — FENTANYL CITRATE (PF) 50 MCG/ML IJ SOLN (WRAP)
INTRAMUSCULAR | Status: AC
Start: 2021-01-20 — End: ?
  Filled 2021-01-20: qty 2

## 2021-01-20 MED ORDER — ONDANSETRON 4 MG PO TBDP
4.0000 mg | ORAL_TABLET | Freq: Once | ORAL | Status: DC | PRN
Start: 2021-01-20 — End: 2021-01-20

## 2021-01-20 MED ORDER — SODIUM CHLORIDE 0.9 % IV SOLN
INTRAVENOUS | Status: DC
Start: 2021-01-20 — End: 2021-01-20

## 2021-01-20 MED ORDER — FENTANYL CITRATE (PF) 50 MCG/ML IJ SOLN (WRAP)
INTRAMUSCULAR | Status: DC | PRN
Start: 2021-01-20 — End: 2021-01-20
  Administered 2021-01-20: 50 ug via INTRAVENOUS

## 2021-01-20 SURGICAL SUPPLY — 68 items
APOLLO TISSUE HELIX (Supply) IMPLANT
BASKET MED RETRIEV HEX 45X15 (Supply) IMPLANT
BRUSH CLEANING COMBINATION (Supply) ×1
BRUSH HEDGEHOG DUAL END (Supply) ×1 IMPLANT
CAP SCRAPING MED (Supply) IMPLANT
CAPSULE BRAVO (Supply) IMPLANT
CATH BALLOON DILATATION 5837 (Supply) IMPLANT
CATH BARRX 360 RFA EXPRESS (Supply) IMPLANT
CATH BARRX 60 RFA FOCA (Supply) IMPLANT
CATH BARRX 90 RFA FOCAL (Supply) IMPLANT
CATH BARRX ULTRA-LONG RFA FOCA (Supply) IMPLANT
CATH CROSPON ENDO FLIP (Supply) IMPLANT
CATH GLD PROBE BICAP 7FX210CM (Supply) IMPLANT
CATH GLD PROBE BICAP 7FX300CM (Supply) IMPLANT
CATH GOLD PROBE 10FR (Supply) IMPLANT
CATH GOLD PROBE INJECTION 10F (Supply) IMPLANT
CLEANER ENZYMATIC BEDSIDE (Supply) ×2 IMPLANT
CLIP LIGATING RES 360 ULT 17MM (Supply) IMPLANT
CLIP RESOLUTION 360 (Supply) IMPLANT
CONNECTOR QUICK PORT (Supply) ×2 IMPLANT
CRE BALLOON 10-12 DILATOR 5835 (Supply) IMPLANT
CRE BALLOON 12-15 DILATOR 5836 (Supply) IMPLANT
CRE BALLOON 18-20 DILATOR 5838 (Supply) IMPLANT
CRE BALLOON 6-8 DILAT 5833 (Supply) IMPLANT
CRE BALLOON 8-10 DILAT 5834 (Supply) IMPLANT
CRE ESOPH PYLORIC COL 10-12 (Supply) IMPLANT
CRE ESOPH PYLORIC COL 12-15 (Supply) IMPLANT
CRE ESOPH PYLORIC COL 15-18 (Supply) IMPLANT
CRE ESOPH PYLORIC COL 6-8 (Supply) IMPLANT
CRE ESOPH PYLORIC COL 8-10 (Supply) IMPLANT
CRE PYLORIC COL 10-12 DIL 5847 (Supply) IMPLANT
CRE PYLORIC COL 12-15 DIL 5848 (Supply) IMPLANT
CRE PYLORIC COL 15-18 DIL 5849 (Supply) IMPLANT
CRE PYLORIC COL 8-10 DIL 5846 (Supply) IMPLANT
DEVICE GRASP RAPTOR 2.4X160 (Supply) IMPLANT
DEVICE TALON GRASP 2.4X160 (Supply) IMPLANT
DIL CRE 18-20 PYL CLN 5850 (Supply) IMPLANT
DIL CRE 18-20 PYL CLN BIL 5871 (Supply) IMPLANT
DREAMWIRE STR .035 450CM (Supply) IMPLANT
FORCEP BIOPSY HOT RADIAL JAW 4 (Supply) IMPLANT
FORCEP BIOPSY RAD JAW 1333-40 (Supply) IMPLANT
FORCEP RAT TOOTH GRASP 2.4 (Supply) IMPLANT
FORCEP RESCUE RAT/ALL 8X230 (Supply) IMPLANT
GUIDEWIRE .038 (Supply) IMPLANT
HEMOSTAT ENDO HEMOSPRAY 5G (Supply) IMPLANT
HEMOSTAT ENDO HEMOSPRAY 7FR (Supply)
KIT PEG 18 FR (Supply) IMPLANT
KIT STD PEG 20FR PULL (Supply) IMPLANT
MARKER ENDOSCOPIC SPOT (Supply) IMPLANT
NDL INTERJECT SCLERO 25G (Supply) IMPLANT
OVERTUBE GUARD ESOPH 10.0-17.9 (Supply) IMPLANT
OVERTUBE GUARD ESOPH 8.6-10.00 (Supply) IMPLANT
PAD CLINCH ENDO TRASPORT (Supply) ×2 IMPLANT
PEG TUBE 18F GASTRO ENDOV (Supply) IMPLANT
PROBE CIRCUMFRENTIAL (Supply) IMPLANT
PROBE SIDE FIRE (Supply) IMPLANT
PROBE STRAIGHT FIRE APC (Supply) IMPLANT
RESCUENET RETRIEVAL  2.5X230 (Supply) IMPLANT
SCISSOR ENZIZOR 2.6MMX236CM (Supply) IMPLANT
SNARE CAPTIVATOR ERM (Supply) IMPLANT
SPEEDBAND (Supply) IMPLANT
SUT APOLLO 2.0 (Supply) IMPLANT
SUT APOLLO OVERSTITCH CINCH (Supply) IMPLANT
SYS APOLLO OVERSTITCH SUTURE (Supply) IMPLANT
SYS APOLLO OVERTUBE ACCESS (Supply) IMPLANT
SYSTEM INFLATION ALLIANCE II (Supply) IMPLANT
VALVE DISP CLEANING BIOGUARD (Supply) ×2 IMPLANT
VALVE OLYMPUS DISP A/W/S/ BIO (Supply) ×2 IMPLANT

## 2021-01-20 NOTE — Addendum Note (Signed)
Addendum  created 01/20/21 1059 by Ardelle Lesches, MD    Clinical Note Signed

## 2021-01-20 NOTE — Anesthesia Preprocedure Evaluation (Signed)
Anesthesia Evaluation    AIRWAY    Mallampati: III    TM distance: >3 FB  Neck ROM: limited  Mouth Opening:full   CARDIOVASCULAR    cardiovascular exam normal       DENTAL           PULMONARY    pulmonary exam normal     OTHER FINDINGS    Pt reports followed closely by cardiology for Hx tachycardia, palpitations and chest pain.  Has had "5" coronary stents and has a loop recorder in place.  He reports has has not had Sx since his most recent coronary stent placement.              Relevant Problems   NEURO/PSYCH   (+) TIA (transient ischemic attack)      CARDIO   (+) CAD (coronary artery disease)   (+) Hypertension   (+) Unstable angina      ENDO   (+) Type 2 diabetes mellitus   (+) Type 2 diabetes mellitus with diabetic neuropathy       PSS Anesthesia Comments: Pt seen and examined on date of surgery.  Prior to the procedure, the patient's history was reviewed, a focused physical examination was performed and patient's medications and allergies were reviewed.  The patient and/or guardian was deemed competent. A full discussion was held with the patient or guardian regarding risks and benefits of the anesthetic including and not limited to dental injury, sore throat, nausea, vomiting, eye injury, mouth or lip trauma.  All questions were answered. Patient or guardian voiced understanding and signed consent.     It should also be noted that due to the nature of the electronic medical record, some data may not be completely accurate due to artifact, failure of electronic medical record devices, inaccurate data entry by other staff, etc.        Anesthesia Plan    ASA 3     MAC                                 informed consent obtained                   Signed by: Ardelle Lesches, MD 01/20/21 9:18 AM

## 2021-01-20 NOTE — Anesthesia Postprocedure Evaluation (Addendum)
Anesthesia Post Evaluation    Patient: Michael [name]    Procedure(s):  EGDw/DIL    Anesthesia type: MAC    Last Vitals:   Vitals Value Taken Time   BP 105/77 01/20/21 0935             Resp 16 01/20/21 0935   SpO2 94 % 01/20/21 0935                 Anesthesia Post Evaluation:     Patient Evaluated: PACU  Patient Participation: complete - patient participated  Level of Consciousness: awake and alert    Pain Management: adequate    Airway Patency: patent    Anesthetic complications: No      PONV Status: none    Cardiovascular status: acceptable  Respiratory status: acceptable  Hydration status: acceptable          Signed by: Ardelle Lesches, MD, 01/20/2021 9:45 AM

## 2021-01-20 NOTE — Discharge Instr - AVS First Page (Signed)
Endoscopy Discharge Instructions      COVID  Thank you for allowing us to provide you care during this evolving time. For questions regarding your test results we encourage you to call the office of the physician who performed your exam.     After you leave the hospital:  Due to the effects of the sedatives, you may feel tired for the remainder of today.   We recommend you go home after discharge  It is advisable to have supervision or access to seek help for a few hours after discharge  Do not drive, operate machinery, or sign important documents until tomorrow.  Resume your normal activities / work tomorrow  Please rest and drink extra fluids today.    Avoid alcohol today.  Unless otherwise guided under the recommendation section of your procedure report, start with light foods and advance to your regular diet as tolerated.  Refer to the recommendation section of the procedure report for medication information.     When to seek help -     Nausea / Vomiting: - try small amounts of bland foods like crackers or toast, & liquids such as soda, electrolyte replacement drinks or ice chips. Call for:  New onset or ongoing nausea and vomiting   The symptoms worsen - cold skin, confusion, blood or unexplainable black appearing vomit  Bleeding:  Passing of blood or clots  or a change in stool consistency and or black in color  Vomiting or spitting up blood  Infection :   Fever - temperature 100.4 or greater  Pain / other:  New onset of pain that does not subside over time or prevents you from normal activity or eating  Shortness of breath or breathing trouble  Weakness, feeling faint, passing out  Swollen or distended abdomen    IV Site  Initial IV site bruising and tenderness is expected and should ease over time  Warm compress may help.   Avoid watches / bracelets to area   If site bleeds after removal   apply consistent pressure for several minutes and replace dressing  Notify provider if:   Insertion site is hot to the  touch or red streaks in your skin near the IV site  Drainage from the IV site   Swelling, pain, or redness at the IV site that worsens over time.   Fever of 100.4F (38C) or higher    During business hours call your physician's office.   After-hours call [facility] [phone] and a physician will be contacted OBSTRUCTIVE SLEEP APNEA BREATHING RISK INFORMATION  Please read!    BREATHING PRECAUTIONS AFTER YOUR SURGERY TODAY:    Sleep with your head of bed elevated at least 30 degrees extra pillows may be used  Use pain medications sparingly   Please use any breathing machines you may have at home while sleeping  A care partner is suggested for the first 24 hours   Call 911 or seek assistance for any respiratory distress  (i.e. fast or slow breathing that is different from your normal breathing, prolonged pauses in breathing, blue tinged lips or nailbeds, shortness of breath, or altered mental status such as confusion, sleepiness, or irritability).        Dear Patient,      Thank you for choosing [facility] for your surgery.  We care about your safety during your surgery and when you return home. Today you were screened to help us decide whether you were at risk for problems   that may arise with your breathing during surgery.  We used a questionnaire to look for signs of problems you may have with breathing.  The questionnaire is only a screening tool.     Obstructive Sleep Apnea (OSA) is a common breathing disorder that may occur during sleep, during surgery, or after surgery.  Patients who have OSA can sometimes be at a higher risk of breathing complications during and after surgery because of the use of medications to relieve pain.  We use these medications to help manage your discomfort during and after surgery.  These medications may sometimes cause your breathing to slow down or cause the tissues in your throat to relax.  Because these medications can cause these things to happen,  we will monitor your breathing during and after surgery while you are in the hospital.        You have a score on your screening questionnaire that indicated you may wish to have further testing in order to look for problems that may arise with your breathing at home after surgery or when you are asleep.  Please share this letter with your primary doctor and discuss whether you may need further testing, such as a sleep study.  A sleep study is the diagnostic test for OSA.  Becoming aware that you have OSA and working with your doctor to treat OSA can help keep you safe.    Sincerely,    Nicolas C. Restrepo, MD  Vice President Medical Affairs  [facility]

## 2021-01-20 NOTE — Transfer of Care (Signed)
Anesthesia Transfer of Care Note    Patient: Michael [name]    Last vitals:   Vitals:    01/20/21 0917   BP: 96/70   Pulse: 89   Resp: 18   Temp:    SpO2: 95%       Oxygen: Mask     Mental Status:sedated    Airway: Natural    Cardiovascular Status:  stable

## 2021-01-21 ENCOUNTER — Encounter (HOSPITAL_BASED_OUTPATIENT_CLINIC_OR_DEPARTMENT_OTHER): Payer: Self-pay | Admitting: Gastroenterology

## 2021-02-01 ENCOUNTER — Other Ambulatory Visit (INDEPENDENT_AMBULATORY_CARE_PROVIDER_SITE_OTHER): Payer: Self-pay | Admitting: Cardiovascular Disease

## 2021-02-01 MED ORDER — METOPROLOL TARTRATE 100 MG TABLET
100.0000 mg | ORAL_TABLET | Freq: Two times a day (BID) | ORAL | 0 refills | Status: DC
Start: 2021-02-01 — End: 2021-04-14

## 2021-02-01 MED ORDER — ISOSORBIDE MONONITRATE ER 30 MG TABLET,EXTENDED RELEASE 24 HR
30.0000 mg | ORAL_TABLET | Freq: Every morning | ORAL | 0 refills | Status: DC
Start: 2021-02-01 — End: 2021-05-03

## 2021-02-01 NOTE — Telephone Encounter (Signed)
LOINC TEST FLAG RESULT REFERENCE RANGE UPDATED BY   2951-2 Sodium [Moles/volume] in Serum or Plasma N 140 mmol/L 135 mmol/L - 145 mmol/L January 30, 2021 4:17:00 PM UTC [facility]: DV)    2823-3 Potassium [Moles/volume] in Serum or Plasma N 4.7 mmol/L 3.5 mmol/L - 5.3 mmol/L January 30, 2021 4:17:00 PM UTC [facility]: DV)    2075-0 Chloride [Moles/volume] in Serum or Plasma N 105 mmol/L 96 mmol/L - 111 mmol/L January 30, 2021 4:17:00 PM UTC [facility]: DV)    2028-9 Carbon dioxide, total [Moles/volume] in Serum or Plasma N 28 mmol/L 22 mmol/L - 33.5 mmol/L January 30, 2021 4:17:00 PM UTC [facility]: DV)    1863-0 Anion gap 4 in Serum or Plasma N 7 mmol/L 7 mmol/L - 16 mmol/L January 30, 2021 4:17:00 PM UTC [facility]: DV)    2345-7 Glucose [Mass/volume] in Serum or Plasma H 128 mg/dL 70 mg/dL - 99 mg/dL January 30, 2021 8:38:18 PM UTC [facility]: DV)    3094-0 Urea nitrogen [Mass/volume] in Serum or Plasma N 15 mg/dL 6 mg/dL - 20 mg/dL January 30, 2021 4:03:75 PM UTC [facility]: DV)    2160-0 Creatinine [Mass/volume] in Serum or Plasma N 0.9 mg/dL 0.5 mg/dL - 1.2 mg/dL January 30, 2021 4:36:06 PM UTC [facility]: DV)    3097-3 Urea nitrogen/Creatinine [Mass Ratio] in Serum or Plasma N 16.7 ratio 10 ratio - 30 ratio January 30, 2021 4:17:00 PM UTC [facility]: DV)    77147-7 Glomerular filtration rate/1.73 sq M.predicted [Volume Rate/Area] in Serum, Plasma or Blood by Creatinine-based formula (MDRD) N 90 mL/min 59 mL/min  January 30, 2021 4:17:00 PM UTC [facility]: DV)    2885-2 Protein [Mass/volume] in Serum or Plasma N 6.3 g/dL 6.3 g/dL - 8.2 g/dL January 30, 2021 7:70:34 PM UTC [facility]: DV)    1751-7 Albumin [Mass/volume] in Serum or Plasma N 3.7 g/dL 3.2 g/dL - 4.6 g/dL January 30, 2021 0:35:24 PM UTC [facility]: DV)    1759-0 Albumin/Globulin [Mass Ratio] in Serum or Plasma N 2.6 g/dL 2.0 g/dL - 3.5 g/dL January 30, 2021 8:18:59 PM UTC [facility]: DV)    1038-9 G Ab [Presence] in Serum or Plasma N 1.4 ratio 0.7 ratio - 1.5 ratio January 30, 2021 4:17:00 PM UTC [facility]: DV)     09311-2 Calcium [Mass/volume] in Serum or Plasma N 9.0 mg/dL 8.8 mg/dL - 16.2 mg/dL January 30, 2021 4:46:95 PM UTC [facility]: DV)    1975-2 Bilirubin.total [Mass/volume] in Serum or Plasma H 3.7 mg/dL 0.1 mg/dL - 1.2 mg/dL January 30, 2021 0:72:25 PM UTC [facility]: DV)    1920-8 Aspartate aminotransferase [Enzymatic activity/volume] in Serum or Plasma N 18 U/L 5 U/L - 42 U/L January 30, 2021 4:17:00 PM UTC [facility]: DV)    1744-2 Alanine aminotransferase [Enzymatic activity/volume] in Serum or Plasma by Without P-5'-P N 21 U/L 5 U/L - 55 U/L January 30, 2021 4:17:00 PM UTC [facility]: DV)    [id number] Alkaline phosphatase [Enzymatic activity/volume] in Serum or Plasma N 66 U/L 40 U/L - 150 U/L January 30, 2021 4:17:00 PM UTC [facility]: DV)

## 2021-03-01 ENCOUNTER — Encounter (HOSPITAL_BASED_OUTPATIENT_CLINIC_OR_DEPARTMENT_OTHER): Payer: Self-pay

## 2021-03-01 SURGERY — DONT USE, USE 1095-ESOPHAGOGASTRODUODENOSCOPY (EGD), DIAGNOSTIC
Anesthesia: Conscious Sedation | Site: Abdomen

## 2021-03-23 ENCOUNTER — Other Ambulatory Visit (INDEPENDENT_AMBULATORY_CARE_PROVIDER_SITE_OTHER): Payer: Self-pay | Admitting: Cardiovascular Disease

## 2021-03-26 ENCOUNTER — Other Ambulatory Visit: Payer: Self-pay

## 2021-03-26 ENCOUNTER — Ambulatory Visit (INDEPENDENT_AMBULATORY_CARE_PROVIDER_SITE_OTHER): Payer: Medicare PPO | Admitting: NURSE PRACTITIONER

## 2021-03-26 VITALS — BP 118/64 | HR 85 | Temp 97.1°F | Resp 18 | Ht 68.0 in | Wt 212.0 lb

## 2021-03-26 DIAGNOSIS — H6121 Impacted cerumen, right ear: Secondary | ICD-10-CM

## 2021-03-26 DIAGNOSIS — J3489 Other specified disorders of nose and nasal sinuses: Secondary | ICD-10-CM

## 2021-03-26 MED ORDER — FEXOFENADINE 180 MG TABLET
180.0000 mg | ORAL_TABLET | Freq: Every day | ORAL | 11 refills | Status: DC
Start: 2021-03-26 — End: 2023-06-21
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
## 2021-03-29 ENCOUNTER — Encounter (HOSPITAL_COMMUNITY): Payer: Self-pay | Admitting: NURSE PRACTITIONER

## 2021-03-29 DIAGNOSIS — H6121 Impacted cerumen, right ear: Secondary | ICD-10-CM
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
## 2021-03-29 NOTE — Procedures (Signed)
ENT, [facility]  [address]  [place] [address]    Procedure Note    Name: Edwin [name] MRN:  [id number]   Date: 03/26/2021 Age: 65 y.o.       Ear Cerumen Removal    Date/Time: 03/29/2021 8:42 AM  Performed by: Orinda Kenner, APRN  Authorized by: Orinda Kenner, APRN     Consent:     Consent obtained:  Verbal    Consent given by:  Patient    Risks discussed:  Incomplete removal, TM perforation, pain and dizziness  Post-procedure details:     Inspection:  Ear canal clear and TM intact    Hearing quality:  Improved    Procedure completion:  Tolerated well, no immediate complications  Comments:      Right EAC examined under binocular microscopy.  Cerumen and/or debris was cleaned from the canal using suction.           Orinda Kenner, APRN

## 2021-03-30 ENCOUNTER — Encounter (INDEPENDENT_AMBULATORY_CARE_PROVIDER_SITE_OTHER): Payer: Medicare PPO | Admitting: Cardiovascular Disease

## 2021-04-02 ENCOUNTER — Encounter (INDEPENDENT_AMBULATORY_CARE_PROVIDER_SITE_OTHER): Payer: Medicare PPO | Admitting: Cardiovascular Disease

## 2021-04-02 DIAGNOSIS — Z95818 Presence of other cardiac implants and grafts: Secondary | ICD-10-CM

## 2021-04-14 ENCOUNTER — Other Ambulatory Visit (INDEPENDENT_AMBULATORY_CARE_PROVIDER_SITE_OTHER): Payer: Self-pay | Admitting: Family

## 2021-04-14 DIAGNOSIS — I10 Essential (primary) hypertension: Secondary | ICD-10-CM

## 2021-04-14 NOTE — Telephone Encounter (Signed)
Recent Results (from the past 24097 hour(s))   LIPID PANEL    Collection Time: 07/15/20  3:02 PM   Result Value    CHOLESTEROL  108    HDL CHOL 27 (L)    TRIGLYCERIDES 303 (H)    LDL CALC 20    VLDL CALC 61 (H)    NON-HDL 81    CHOL/HDL RATIO 4.0    Narrative    Icterus can alter results at this level (mild).        Lab Results   Component Value Date/Time    BUN 22 08/19/2020 05:41 AM    CREATININE 0.83 08/19/2020 05:41 AM    GFR >90 08/19/2020 05:41 AM    SODIUM 140 08/19/2020 05:41 AM    POTASSIUM 5.2 (H) 08/19/2020 05:41 AM    CHLORIDE 107 08/19/2020 05:41 AM    CALCIUM 8.8 08/19/2020 05:41 AM    PHOSPHORUS 3.5 08/19/2020 05:41 AM    MAGNESIUM 2.1 08/19/2020 05:41 AM    AST 29 08/19/2020 05:41 AM    ALT 37 08/19/2020 05:41 AM    LIPASE 102 07/16/2019 03:00 PM    HGB 14.2 08/19/2020 05:41 AM    HCT 43.6 08/19/2020 05:41 AM       Dario Ave, RN  04/14/2021, 08:42

## 2021-04-16 ENCOUNTER — Ambulatory Visit (INDEPENDENT_AMBULATORY_CARE_PROVIDER_SITE_OTHER): Payer: Self-pay

## 2021-04-16 NOTE — Telephone Encounter (Signed)
Vendor / CIED type:[facility] [employer] ILR    Following physician:Manish    Alert / Problem:Battery has reached RRT    Associated Symptoms:    Pertinent History:CAD, TIA. Reason for ILR - cryptogenic stroke.     Pertinent Medications:    Any re-programming recommendations:

## 2021-04-21 ENCOUNTER — Ambulatory Visit (INDEPENDENT_AMBULATORY_CARE_PROVIDER_SITE_OTHER): Payer: Self-pay | Admitting: Cardiovascular Disease

## 2021-04-21 NOTE — Telephone Encounter (Signed)
Patient left VM wondering if the he was in CHF. Patient listed symptoms of an unresolved cough, duration unknown, pressure in his head. PND and chest pressure.   Attempted to call patient back and left VM that he would need to be seen in office for evaluation. Some of they symptoms could be CHF, but typically does not include head pressure and PND. Advised to call for earlier appt than the 05/19/2021 appointment that is scheduled. Also advised to reach out to PCP to see if he can be seen for possible of URI.    [place], [place]  06/23/8586, 50:27

## 2021-04-23 ENCOUNTER — Other Ambulatory Visit (INDEPENDENT_AMBULATORY_CARE_PROVIDER_SITE_OTHER): Payer: Self-pay | Admitting: Cardiovascular Disease

## 2021-04-23 NOTE — Telephone Encounter (Signed)
Patient has appt 05/19/21.  [place], LPN  09/20/7844, 96:29

## 2021-04-24 ENCOUNTER — Other Ambulatory Visit (INDEPENDENT_AMBULATORY_CARE_PROVIDER_SITE_OTHER): Payer: Self-pay | Admitting: Family

## 2021-04-27 ENCOUNTER — Other Ambulatory Visit (INDEPENDENT_AMBULATORY_CARE_PROVIDER_SITE_OTHER): Payer: Self-pay | Admitting: Cardiovascular Disease

## 2021-04-28 NOTE — Telephone Encounter (Signed)
Pt has appt 05/19/21.

## 2021-05-01 ENCOUNTER — Other Ambulatory Visit (INDEPENDENT_AMBULATORY_CARE_PROVIDER_SITE_OTHER): Payer: Self-pay | Admitting: Family

## 2021-05-01 DIAGNOSIS — I2510 Atherosclerotic heart disease of native coronary artery without angina pectoris: Secondary | ICD-10-CM

## 2021-05-03 ENCOUNTER — Encounter (INDEPENDENT_AMBULATORY_CARE_PROVIDER_SITE_OTHER): Payer: Medicare PPO | Admitting: Cardiovascular Disease

## 2021-05-03 DIAGNOSIS — Z95818 Presence of other cardiac implants and grafts: Secondary | ICD-10-CM
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
## 2021-05-19 ENCOUNTER — Ambulatory Visit (INDEPENDENT_AMBULATORY_CARE_PROVIDER_SITE_OTHER): Payer: Medicare PPO | Admitting: NURSE PRACTITIONER, FAMILY

## 2021-05-19 ENCOUNTER — Other Ambulatory Visit: Payer: Self-pay

## 2021-05-19 ENCOUNTER — Encounter (INDEPENDENT_AMBULATORY_CARE_PROVIDER_SITE_OTHER): Payer: Self-pay | Admitting: NURSE PRACTITIONER, FAMILY

## 2021-05-19 VITALS — BP 123/73 | HR 77 | Ht 68.0 in | Wt 234.6 lb

## 2021-05-19 DIAGNOSIS — I2510 Atherosclerotic heart disease of native coronary artery without angina pectoris: Secondary | ICD-10-CM

## 2021-05-19 DIAGNOSIS — G4733 Obstructive sleep apnea (adult) (pediatric): Secondary | ICD-10-CM

## 2021-05-19 DIAGNOSIS — E119 Type 2 diabetes mellitus without complications: Secondary | ICD-10-CM

## 2021-05-19 DIAGNOSIS — I10 Essential (primary) hypertension: Secondary | ICD-10-CM

## 2021-05-19 DIAGNOSIS — E785 Hyperlipidemia, unspecified: Secondary | ICD-10-CM

## 2021-05-19 DIAGNOSIS — I517 Cardiomegaly: Secondary | ICD-10-CM

## 2021-05-19 DIAGNOSIS — I679 Cerebrovascular disease, unspecified: Secondary | ICD-10-CM

## 2021-05-19 DIAGNOSIS — I639 Cerebral infarction, unspecified: Secondary | ICD-10-CM

## 2021-05-19 MED ORDER — ATORVASTATIN 80 MG TABLET
80.0000 mg | ORAL_TABLET | Freq: Every evening | ORAL | 3 refills | Status: DC
Start: 2021-05-19 — End: 2021-11-12

## 2021-05-19 MED ORDER — FUROSEMIDE 20 MG TABLET
20.0000 mg | ORAL_TABLET | Freq: Every day | ORAL | 3 refills | Status: AC
Start: 2021-05-19 — End: ?

## 2021-05-19 MED ORDER — CLOPIDOGREL 75 MG TABLET
75.0000 mg | ORAL_TABLET | Freq: Every day | ORAL | 3 refills | Status: DC
Start: 2021-05-19 — End: 2021-07-19

## 2021-05-19 MED ORDER — DAPAGLIFLOZIN PROPANEDIOL 10 MG TABLET
10.0000 mg | ORAL_TABLET | Freq: Every day | ORAL | 3 refills | Status: DC
Start: 2021-05-19 — End: 2022-08-30

## 2021-05-19 MED ORDER — METOPROLOL TARTRATE 100 MG TABLET
100.0000 mg | ORAL_TABLET | Freq: Two times a day (BID) | ORAL | 3 refills | Status: DC
Start: 2021-05-19 — End: 2021-07-19
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
## 2021-06-01 LAB — ECG W INTERP (CLINIC ONLY)
Atrial Rate: 74 {beats}/min
Calculated P Axis: 34 degrees
Calculated R Axis: 46 degrees
Calculated T Axis: 15 degrees
PR Interval: 200 ms
QRS Duration: 80 ms
QT Interval: 368 ms
QTC Calculation: 408 ms
Ventricular rate: 74 {beats}/min

## 2021-06-04 ENCOUNTER — Other Ambulatory Visit (INDEPENDENT_AMBULATORY_CARE_PROVIDER_SITE_OTHER): Payer: Self-pay | Admitting: Cardiovascular Disease

## 2021-06-28 ENCOUNTER — Ambulatory Visit
Admission: RE | Admit: 2021-06-28 | Discharge: 2021-06-28 | Disposition: A | Discharge status: Home / Self Care | Payer: No Typology Code available for payment source | Source: Ambulatory Visit | Attending: Gastroenterology | Admitting: Gastroenterology

## 2021-06-28 ENCOUNTER — Ambulatory Visit (HOSPITAL_BASED_OUTPATIENT_CLINIC_OR_DEPARTMENT_OTHER): Payer: No Typology Code available for payment source | Admitting: Anesthesiology

## 2021-06-28 ENCOUNTER — Encounter (HOSPITAL_BASED_OUTPATIENT_CLINIC_OR_DEPARTMENT_OTHER): Payer: Self-pay | Admitting: Gastroenterology

## 2021-06-28 ENCOUNTER — Encounter (HOSPITAL_BASED_OUTPATIENT_CLINIC_OR_DEPARTMENT_OTHER)
Admission: RE | Disposition: A | Discharge status: Home / Self Care | Payer: Self-pay | Source: Ambulatory Visit | Attending: Gastroenterology

## 2021-06-28 DIAGNOSIS — R1319 Other dysphagia: Secondary | ICD-10-CM | POA: Insufficient documentation

## 2021-06-28 DIAGNOSIS — I10 Essential (primary) hypertension: Secondary | ICD-10-CM | POA: Insufficient documentation

## 2021-06-28 DIAGNOSIS — E1140 Type 2 diabetes mellitus with diabetic neuropathy, unspecified: Secondary | ICD-10-CM | POA: Insufficient documentation

## 2021-06-28 DIAGNOSIS — Z8673 Personal history of transient ischemic attack (TIA), and cerebral infarction without residual deficits: Secondary | ICD-10-CM | POA: Insufficient documentation

## 2021-06-28 DIAGNOSIS — I25110 Atherosclerotic heart disease of native coronary artery with unstable angina pectoris: Secondary | ICD-10-CM | POA: Insufficient documentation

## 2021-06-28 HISTORY — PX: EGD: SHX3789

## 2021-06-28 SURGERY — DONT USE, USE 1095-ESOPHAGOGASTRODUODENOSCOPY (EGD), DIAGNOSTIC
Anesthesia: Anesthesia MAC / Sedation | Site: Abdomen | Wound class: Clean Contaminated

## 2021-06-28 MED ORDER — SODIUM CHLORIDE 0.9 % IV SOLN
INTRAVENOUS | Status: DC
Start: 2021-06-28 — End: 2021-06-28

## 2021-06-28 MED ORDER — ALBUTEROL SULFATE (2.5 MG/3ML) 0.083% IN NEBU
2.5000 mg | INHALATION_SOLUTION | RESPIRATORY_TRACT | Status: DC | PRN
Start: 2021-06-28 — End: 2021-06-28

## 2021-06-28 MED ORDER — PROPOFOL 200 MG/20ML IV EMUL
INTRAVENOUS | Status: DC | PRN
Start: 2021-06-28 — End: 2021-06-28
  Administered 2021-06-28 (×2): 20 mg via INTRAVENOUS
  Administered 2021-06-28: 100 mg via INTRAVENOUS
  Administered 2021-06-28: 20 mg via INTRAVENOUS

## 2021-06-28 MED ORDER — ONDANSETRON HCL 4 MG/2ML IJ SOLN
4.0000 mg | Freq: Once | INTRAMUSCULAR | Status: DC | PRN
Start: 2021-06-28 — End: 2021-06-28

## 2021-06-28 MED ORDER — PROPOFOL 200 MG/20ML IV EMUL
INTRAVENOUS | Status: AC
Start: 2021-06-28 — End: ?
  Filled 2021-06-28: qty 40

## 2021-06-28 MED ORDER — SODIUM CHLORIDE (PF) 0.9 % IJ SOLN
3.0000 mL | Freq: Two times a day (BID) | INTRAMUSCULAR | Status: DC
Start: 2021-06-28 — End: 2021-06-28

## 2021-06-28 MED ORDER — ONDANSETRON 4 MG PO TBDP
4.0000 mg | ORAL_TABLET | Freq: Once | ORAL | Status: DC | PRN
Start: 2021-06-28 — End: 2021-06-28

## 2021-06-28 SURGICAL SUPPLY — 67 items
APOLLO TISSUE HELIX (Supply) IMPLANT
BASKET MED RETRIEV HEX 45X15 (Supply) IMPLANT
BRUSH HEDGEHOG DUAL END (Supply) ×2 IMPLANT
CAP SCRAPING MED (Supply) IMPLANT
CAPSULE BRAVO (Supply) IMPLANT
CATH BALLOON DILATATION 5837 (Supply) IMPLANT
CATH BARRX 360 RFA EXPRESS (Supply) IMPLANT
CATH BARRX 60 RFA FOCA (Supply) IMPLANT
CATH BARRX 90 RFA FOCAL (Supply) IMPLANT
CATH BARRX ULTRA-LONG RFA FOCA (Supply) IMPLANT
CATH CROSPON ENDO FLIP (Supply) IMPLANT
CATH GLD PROBE BICAP 7FX210CM (Supply) IMPLANT
CATH GLD PROBE BICAP 7FX300CM (Supply) IMPLANT
CATH GOLD PROBE 10FR (Supply) IMPLANT
CATH GOLD PROBE INJECTION 10F (Supply) IMPLANT
CLEANER ENZYMATIC BEDSIDE (Supply) ×2 IMPLANT
CLIP LIGATING RES 360 ULT 17MM (Supply) IMPLANT
CLIP RESOLUTION 360 (Supply) IMPLANT
CONNECTOR QUICK PORT (Supply) ×2 IMPLANT
CRE BALLOON 10-12 DILATOR 5835 (Supply) IMPLANT
CRE BALLOON 12-15 DILATOR 5836 (Supply) IMPLANT
CRE BALLOON 18-20 DILATOR 5838 (Supply) IMPLANT
CRE BALLOON 6-8 DILAT 5833 (Supply) IMPLANT
CRE BALLOON 8-10 DILAT 5834 (Supply) IMPLANT
CRE ESOPH PYLORIC COL 10-12 (Supply) IMPLANT
CRE ESOPH PYLORIC COL 12-15 (Supply) IMPLANT
CRE ESOPH PYLORIC COL 15-18 (Supply) IMPLANT
CRE ESOPH PYLORIC COL 6-8 (Supply) IMPLANT
CRE ESOPH PYLORIC COL 8-10 (Supply) IMPLANT
CRE PYLORIC COL 10-12 DIL 5847 (Supply) IMPLANT
CRE PYLORIC COL 12-15 DIL 5848 (Supply) IMPLANT
CRE PYLORIC COL 15-18 DIL 5849 (Supply) IMPLANT
CRE PYLORIC COL 8-10 DIL 5846 (Supply) IMPLANT
DEVICE GRASP RAPTOR 2.4X160 (Supply) IMPLANT
DEVICE TALON GRASP 2.4X160 (Supply) IMPLANT
DIL CRE 18-20 PYL CLN 5850 (Supply) IMPLANT
DIL CRE 18-20 PYL CLN BIL 5871 (Supply) IMPLANT
DREAMWIRE STR .035 450CM (Supply) IMPLANT
FORCEP BIOPSY HOT RADIAL JAW 4 (Supply) IMPLANT
FORCEP BIOPSY RAD JAW 1333-40 (Supply) IMPLANT
FORCEP RAT TOOTH GRASP 2.4 (Supply) IMPLANT
FORCEP RESCUE RAT/ALL 8X230 (Supply) IMPLANT
GUIDEWIRE .038 (Supply) IMPLANT
HEMOSTAT ENDO HEMOSPRAY 5G (Supply) IMPLANT
HEMOSTAT ENDO HEMOSPRAY 7FR (Supply)
KIT PEG 18 FR (Supply) IMPLANT
KIT STD PEG 20FR PULL (Supply) IMPLANT
MARKER ENDOSCOPIC SPOT (Supply) IMPLANT
NDL INTERJECT SCLERO 25G (Supply) IMPLANT
OVERTUBE GUARD ESOPH 10.0-17.9 (Supply) IMPLANT
OVERTUBE GUARD ESOPH 8.6-10.00 (Supply) IMPLANT
PAD CLINCH ENDO TRASPORT (Supply) ×2 IMPLANT
PEG TUBE 18F GASTRO ENDOV (Supply) IMPLANT
PROBE CIRCUMFRENTIAL (Supply) IMPLANT
PROBE SIDE FIRE (Supply) IMPLANT
PROBE STRAIGHT FIRE APC (Supply) IMPLANT
RESCUENET RETRIEVAL  2.5X230 (Supply) IMPLANT
SCISSOR ENZIZOR 2.6MMX236CM (Supply) IMPLANT
SNARE CAPTIVATOR ERM (Supply) IMPLANT
SPEEDBAND (Supply) IMPLANT
SUT APOLLO 2.0 (Supply) IMPLANT
SUT APOLLO OVERSTITCH CINCH (Supply) IMPLANT
SYS APOLLO OVERSTITCH SUTURE (Supply) IMPLANT
SYS APOLLO OVERTUBE ACCESS (Supply) IMPLANT
SYSTEM INFLATION ALLIANCE II (Supply) IMPLANT
VALVE DISP CLEANING BIOGUARD (Supply) ×2 IMPLANT
VALVE OLYMPUS DISP A/W/S/ BIO (Supply) ×2 IMPLANT

## 2021-06-28 NOTE — Discharge Instr - AVS First Page (Signed)
Endoscopy Discharge Instructions    After you leave the hospital:  Due to the effects of the sedatives, you may feel tired for the remainder of today.   We recommend you go home after discharge  It is advisable to have supervision or access to seek help for a few hours after discharge  Do not drive, operate machinery, or sign important documents until tomorrow.  Resume your normal activities / work tomorrow  Please rest and drink extra fluids today.    Avoid alcohol today.  Unless otherwise guided under the recommendation section of your procedure report, start with light foods and advance to your regular diet as tolerated.  Refer to the recommendation section of the procedure report for medication information.  IV Site  Initial IV site bruising and tenderness is expected and should ease over time  Warm compress may help.   Avoid watches / bracelets to area   If site bleeds after removal apply consistent pressure for several minutes and replace dressing  Notify provider if:   Insertion site is hot to the touch or red streaks in your skin near the IV site  Drainage from the IV site   Swelling, pain, or redness at the IV site that worsens over time.   Fever of 100.4F (38C) or higher    Obstructive sleep apnea -Medicines you were given may cause you to have more apnea spells that may last longer than normal.    Unless otherwise instructed, keep using the continuous positive airway pressure (CPAP) device when you sleep day or night.   Talk with your provider before taking any pain medicine, muscle relaxants, or sedatives.     Seeking Medical Attention: -   you should be back to your usual activity within 24 hours.     Nausea / Vomiting: - try small amounts of bland foods like crackers or toast, & liquids such as soda, electrolyte replacement drinks or ice chips. Call for:  New onset or ongoing nausea and vomiting   The symptoms worsen - cold skin, confusion, blood or unexplainable black appearing  vomit  Bleeding:  Passing of blood or clots  or a change in stool consistency and or black in color  Vomiting or spitting up blood  Infection:  Fever- temperature of 100.4 or greater  Pain / other:  New onset of pain that does not subside over time or prevents you from normal activity or eating  Shortness of breath or breathing trouble  Weakness, feeling faint, passing out  Swollen or distended abdomen    For assistance with concerns:  During business hours, call your physician's office.   After-hours call [facility] [phone] and a representative will be contacted  For immediate or urgent care call 911

## 2021-06-28 NOTE — Anesthesia Postprocedure Evaluation (Signed)
Anesthesia Post Evaluation    Patient: Michael [name]    Procedure(s):  EGD/DIL    Anesthesia type: MAC    Last Vitals:   Vitals Value Taken Time   BP 143/78 06/28/21 1027   Temp 36.4 C (97.5 F) 06/28/21 1027   Pulse 88 06/28/21 1027   Resp 18 06/28/21 1027   SpO2 96 % 06/28/21 1027                 Anesthesia Post Evaluation:     Patient Participation: complete - patient participated  Level of Consciousness: awake and alert  Pain Score: 0  Pain Management: adequate    Airway Patency: patent    Anesthetic complications: No      PONV Status: none    Cardiovascular status: acceptable and stable  Respiratory status: acceptable and room air  Hydration status: acceptable  Comments: The patient tolerated the endoscopy procedure well.  There are no anesthetic complications, no reportable events,  and all questions have been answered.        Signed by: Latina Craver, DO, 06/28/2021 10:50 AM

## 2021-06-28 NOTE — Anesthesia Preprocedure Evaluation (Addendum)
Anesthesia Evaluation    AIRWAY    Mallampati: II    TM distance: >3 FB  Neck ROM: full  Mouth Opening:full   CARDIOVASCULAR    regular and normal       DENTAL        (+) upper dentures and lower dentures             PULMONARY    pulmonary exam normal     OTHER FINDINGS                                      Relevant Problems   NEURO/PSYCH   (+) TIA (transient ischemic attack)      CARDIO   (+) CAD (coronary artery disease)   (+) Hypertension   (+) Unstable angina      ENDO   (+) Type 2 diabetes mellitus   (+) Type 2 diabetes mellitus with diabetic neuropathy       PSS Anesthesia Comments: Due to the nature of the electronic record, every data point may not be accurate.  Every attempt is made to ensure the record's accuracy.    PMH has been thoroughly reviewed.    The patient complete the prep for the procedure in accordance with established NPO guideline.            Anesthesia Plan    ASA 3     MAC                     intravenous induction   Detailed anesthesia plan: MAC            informed consent obtained                   Signed by: Latina Craver, DO 06/28/21 10:24 AM

## 2021-06-28 NOTE — Transfer of Care (Signed)
Anesthesia Transfer of Care Note    Patient: Michael [name]    Procedures performed: Procedure(s):  EGD/DIL    Anesthesia type: MAC    Patient location:  Endo PACU    Last vitals:   Vitals:    06/28/21 1027   BP: 143/78   Pulse: 88   Resp: 18   Temp: 36.4 C (97.5 F)   SpO2: 96%       Post pain: Patient not complaining of pain. Continue current therapy.     Mental Status:  awake    Respiratory Function: room air    Cardiovascular: stable    Nausea/Vomiting: patient not complaining of nausea or vomiting    Hydration Status: adequate    Post assessment: No apparent anesthetic complications and no reportable events.  No evidence of recall.  Appears comfortable.

## 2021-06-29 ENCOUNTER — Encounter (HOSPITAL_BASED_OUTPATIENT_CLINIC_OR_DEPARTMENT_OTHER): Payer: Self-pay | Admitting: Gastroenterology

## 2021-07-01 ENCOUNTER — Encounter (INDEPENDENT_AMBULATORY_CARE_PROVIDER_SITE_OTHER): Payer: Self-pay | Admitting: Internal Medicine

## 2021-07-01 ENCOUNTER — Ambulatory Visit (INDEPENDENT_AMBULATORY_CARE_PROVIDER_SITE_OTHER): Payer: No Typology Code available for payment source | Admitting: Internal Medicine

## 2021-07-01 VITALS — BP 120/70 | HR 85 | Resp 19 | Ht 68.0 in | Wt 224.0 lb

## 2021-07-01 DIAGNOSIS — G4733 Obstructive sleep apnea (adult) (pediatric): Secondary | ICD-10-CM

## 2021-07-01 NOTE — Progress Notes (Signed)
Subjective:    Patient ID: Michael [name] is a 66 y.o. male.    Chief complaint here for evaluation of obstructive sleep apnea.  Patient notes he was diagnosed with sleep apnea several years ago and had a hard time tolerating the CPAP so he turned it in.  Recently his sleep is gotten worse.  He feels like he wakes up all the time.  He feels like he is 90 days or messed up.  Will follow sleep during the day but has a hard time sleeping at night.  He snores.  People tell that he stops breathing.  He falls asleep at inopportune times during the day.  He tosses and turns all night.  He does have an insulin pump.        Review of Systems   Neurological:         Snoring, fatigue, nonrefreshing sleep all noted   All other systems reviewed and are negative.            Objective:    Physical Exam  Vitals reviewed.   Constitutional:       General: He is not in acute distress.     Appearance: He is obese.   HENT:      Head: Normocephalic and atraumatic.      Nose: Nose normal.      Mouth/Throat:      Mouth: Mucous membranes are moist.   Eyes:      Extraocular Movements: Extraocular movements intact.      Conjunctiva/sclera: Conjunctivae normal.      Pupils: Pupils are equal, round, and reactive to light.   Cardiovascular:      Rate and Rhythm: Normal rate and regular rhythm.      Pulses: Normal pulses.      Heart sounds: No murmur heard.  Pulmonary:      Effort: Pulmonary effort is normal. No respiratory distress.      Breath sounds: No wheezing.   Abdominal:      General: Bowel sounds are normal. There is no distension.      Palpations: Abdomen is soft.   Musculoskeletal:         General: No swelling or deformity. Normal range of motion.      Cervical back: Normal range of motion. No rigidity.   Skin:     General: Skin is warm.      Findings: No bruising.   Neurological:      General: No focal deficit present.      Mental Status: He is alert and oriented to person, place, and time. Mental status is at baseline.    Psychiatric:         Mood and Affect: Mood normal.         Behavior: Behavior normal.       Sleep study from 2019 has an AHI of 62      Assessment:     1. Obstructive sleep apnea syndrome         Plan:       1.  OSA-historical diagnosis.  Not on any treatment.  Update the sleep study and proceed from there

## 2021-07-02 ENCOUNTER — Encounter (INDEPENDENT_AMBULATORY_CARE_PROVIDER_SITE_OTHER): Payer: Self-pay | Admitting: Internal Medicine

## 2021-07-02 NOTE — Progress Notes (Signed)
Split Night order was sent to [facility] on 3/17

## 2021-07-08 ENCOUNTER — Encounter (RURAL_HEALTH_CENTER): Payer: Self-pay | Admitting: Primary Podiatric Medicine

## 2021-07-08 ENCOUNTER — Ambulatory Visit
Payer: No Typology Code available for payment source | Attending: Primary Podiatric Medicine | Admitting: Primary Podiatric Medicine

## 2021-07-08 VITALS — Temp 98.7°F | Wt 236.4 lb

## 2021-07-08 DIAGNOSIS — M21622 Bunionette of left foot: Secondary | ICD-10-CM

## 2021-07-08 DIAGNOSIS — B351 Tinea unguium: Secondary | ICD-10-CM

## 2021-07-08 DIAGNOSIS — E1142 Type 2 diabetes mellitus with diabetic polyneuropathy: Secondary | ICD-10-CM

## 2021-07-08 DIAGNOSIS — L851 Acquired keratosis [keratoderma] palmaris et plantaris: Secondary | ICD-10-CM

## 2021-07-08 DIAGNOSIS — M21621 Bunionette of right foot: Secondary | ICD-10-CM

## 2021-07-08 DIAGNOSIS — M79671 Pain in right foot: Secondary | ICD-10-CM

## 2021-07-08 DIAGNOSIS — M21372 Foot drop, left foot: Secondary | ICD-10-CM

## 2021-07-08 DIAGNOSIS — L600 Ingrowing nail: Secondary | ICD-10-CM

## 2021-07-08 DIAGNOSIS — M79672 Pain in left foot: Secondary | ICD-10-CM

## 2021-07-08 NOTE — Progress Notes (Signed)
Subjective:    Patient ID: Michael [name] is a 66 y.o. male.    Patient with a history of diabetes with peripheral polyneuropathy as well as previous CVA and spinal stenosis who is seen today for exam and treatment of his lower extremity.  We previously gotten patient bracing for this dropfoot deformity on this left side which continues to work well.  Patient complains of dystrophic tender incurvated nails which are difficult for him to manage as well as painful hyperkeratotic lesions over this lower extremity.  Patient resting comfortably today in no apparent distress and denies any open lesions or drainage.        The following portions of the patient's history were reviewed and updated as appropriate: allergies, current medications, past family history, past medical history, past social history, past surgical history, and problem list.    Review of Systems   Constitutional: Negative.    Respiratory: Negative.     Cardiovascular: Negative.    Musculoskeletal: Negative.    Skin: Negative.    Allergic/Immunologic: Negative.    Neurological:  Positive for numbness.   Hematological: Negative.    All other systems reviewed and are negative.    Temperature 98.7 F (37.1 C), temperature source Temporal, weight 107.2 kg (236 lb 6.4 oz), SpO2 98 %.        Objective:    Physical Exam  Patient with warm dry supple skin over this lower extremity.  Patient with nails that are thick dystrophic discolored and elongated x10 with incurvation relieved with debridement.  Patient with hyperkeratotic lesion noted subfifth thumb bilaterally.  This is relieved with debridement as well.  Patient with palpable pulses mild edema but without ischemic changes or claudication.  Patient with a decreased protective sensations of his lower extremities monofilament with numbness in these feet.  Patient with decreased dorsiflexion eversion strength on this left side with increased pressure Sub fifth bilaterally.      Assessment:       1.  Diabetic peripheral neuropathy associated with type 2 diabetes mellitus    2. Onychomycosis    3. Ingrown nail    4. Foot drop, left foot    5. Pain in both feet    6. Tailor's bunion of both feet    7. Acquired keratoderma          Plan:       After consent we did debride nails x10 without incident taking care to reduce thickness and incurvation to provide relief.  After appropriate prep and anesthesia we did evaluate these hyperkeratotic lesions up fifth and bilaterally before performing sharp debridement to these sites with a #15 blade removing his nonvital tissue.  Discussed the need for custom diabetic shoe gear and advised to call with new concerns or complaints over this lower extremity.

## 2021-07-19 ENCOUNTER — Other Ambulatory Visit (INDEPENDENT_AMBULATORY_CARE_PROVIDER_SITE_OTHER): Payer: Self-pay | Admitting: Cardiovascular Disease

## 2021-07-19 DIAGNOSIS — I639 Cerebral infarction, unspecified: Secondary | ICD-10-CM

## 2021-07-19 DIAGNOSIS — I2510 Atherosclerotic heart disease of native coronary artery without angina pectoris: Secondary | ICD-10-CM

## 2021-07-19 DIAGNOSIS — I679 Cerebrovascular disease, unspecified: Secondary | ICD-10-CM

## 2021-07-19 DIAGNOSIS — I10 Essential (primary) hypertension: Secondary | ICD-10-CM

## 2021-07-19 MED ORDER — CLOPIDOGREL 75 MG TABLET
75.0000 mg | ORAL_TABLET | Freq: Every day | ORAL | 3 refills | Status: DC
Start: 2021-07-19 — End: 2022-08-30

## 2021-07-19 MED ORDER — RANOLAZINE ER 500 MG TABLET,EXTENDED RELEASE,12 HR
500.0000 mg | ORAL_TABLET | Freq: Two times a day (BID) | ORAL | 2 refills | Status: DC
Start: 2021-07-19 — End: 2022-11-09

## 2021-07-19 MED ORDER — ISOSORBIDE MONONITRATE ER 30 MG TABLET,EXTENDED RELEASE 24 HR
ORAL_TABLET | ORAL | 2 refills | Status: DC
Start: 2021-07-19 — End: 2022-04-01

## 2021-07-19 MED ORDER — METOPROLOL TARTRATE 100 MG TABLET
100.0000 mg | ORAL_TABLET | Freq: Two times a day (BID) | ORAL | 2 refills | Status: DC
Start: 2021-07-19 — End: 2021-10-28

## 2021-07-19 NOTE — Telephone Encounter (Signed)
Patient is changing pharmacies and requesting refills be sent to keep on hold for future refills.    [place], [place]  08/21/2561, 89:37

## 2021-07-28 ENCOUNTER — Ambulatory Visit
Admission: RE | Admit: 2021-07-28 | Discharge: 2021-07-28 | Disposition: A | Discharge status: Home / Self Care | Payer: No Typology Code available for payment source | Source: Ambulatory Visit | Attending: Internal Medicine | Admitting: Internal Medicine

## 2021-07-28 DIAGNOSIS — E119 Type 2 diabetes mellitus without complications: Secondary | ICD-10-CM | POA: Insufficient documentation

## 2021-07-28 DIAGNOSIS — G4733 Obstructive sleep apnea (adult) (pediatric): Secondary | ICD-10-CM | POA: Insufficient documentation

## 2021-07-28 DIAGNOSIS — I10 Essential (primary) hypertension: Secondary | ICD-10-CM | POA: Insufficient documentation

## 2021-07-28 DIAGNOSIS — E669 Obesity, unspecified: Secondary | ICD-10-CM | POA: Insufficient documentation

## 2021-07-28 DIAGNOSIS — R0683 Snoring: Secondary | ICD-10-CM | POA: Insufficient documentation

## 2021-07-29 NOTE — Procedures (Cosign Needed)
PT HAD A SPLIT (CPAP WAS NOT ADDED)      PATIENT NAME: Michael [name]    MRN NUMBER:  [id number]     Date: July 29, 2021      PROCEDURES:  Study Type:  Adult Polysomnogram       Sleep Technologist:Pattricia Weiher Borostovic R. PSG T    Study Location [facility]    Primary Diagnosis G47.33 Obstructive Sleep Apnea    Reason for visit:  OSA     Study Notes:   CPAP WAS NOT ADDED    Requesting Clinician: Dr. Jonni Sanger    Medications:  SEE REPORT                  CLINICAL INTERPRETATION:

## 2021-07-29 NOTE — Progress Notes (Signed)
Pt had a PSG.  E. Cherrill Scrima, RPSGT

## 2021-08-04 ENCOUNTER — Telehealth (INDEPENDENT_AMBULATORY_CARE_PROVIDER_SITE_OTHER): Payer: Self-pay

## 2021-08-04 NOTE — Telephone Encounter (Signed)
Pt contacted to advise PSG came back (POSITIVE) for MILD OSA w/SEVERE O2 desats noted during the study. (AHI = 7/RDI=50/ Lowest Recorded O2 Sat = 79%RA)  Oxygen saturation was <88% for 9.4 minutes of the study.     Per DR.LESSAR's last office note, pt had a PSG performed to RE-EVALUATE FOR OSA     PROCEED TO TREATEMENT IF PT IS AGREEABLE.     Prescribe setup w/an Auto Titrating CPAP through a DME company. If pt chooses this option, they will need to call our office and schedule a PAP compliance appt w/DR.LESSAR within 31-90 of being setup on the PAP.     PT AGREES TO TREATEMENT.     RX:  APAP 4-20CM WITH HEATED WIRE TUBING AND A RESMED F30i FULL FACE MASK.  DME provider:  [facility]

## 2021-08-05 ENCOUNTER — Encounter (INDEPENDENT_AMBULATORY_CARE_PROVIDER_SITE_OTHER): Payer: Self-pay | Admitting: Internal Medicine

## 2021-08-05 NOTE — Telephone Encounter (Signed)
ORDER FOR CPAP SENT TO [facility] ON 08/05/2021

## 2021-08-05 NOTE — Progress Notes (Signed)
ORDER FOR CPAP SENT TO [facility] ON 08/05/2021

## 2021-10-14 ENCOUNTER — Encounter (RURAL_HEALTH_CENTER): Payer: Self-pay | Admitting: Primary Podiatric Medicine

## 2021-10-14 ENCOUNTER — Ambulatory Visit
Payer: No Typology Code available for payment source | Attending: Primary Podiatric Medicine | Admitting: Primary Podiatric Medicine

## 2021-10-14 VITALS — HR 72 | Temp 98.6°F | Wt 218.8 lb

## 2021-10-14 DIAGNOSIS — L851 Acquired keratosis [keratoderma] palmaris et plantaris: Secondary | ICD-10-CM

## 2021-10-14 DIAGNOSIS — B351 Tinea unguium: Secondary | ICD-10-CM

## 2021-10-14 DIAGNOSIS — M79671 Pain in right foot: Secondary | ICD-10-CM

## 2021-10-14 DIAGNOSIS — M21621 Bunionette of right foot: Secondary | ICD-10-CM

## 2021-10-14 DIAGNOSIS — M79672 Pain in left foot: Secondary | ICD-10-CM

## 2021-10-14 DIAGNOSIS — E1142 Type 2 diabetes mellitus with diabetic polyneuropathy: Secondary | ICD-10-CM

## 2021-10-14 DIAGNOSIS — M21622 Bunionette of left foot: Secondary | ICD-10-CM

## 2021-10-14 DIAGNOSIS — M21372 Foot drop, left foot: Secondary | ICD-10-CM

## 2021-10-14 DIAGNOSIS — L600 Ingrowing nail: Secondary | ICD-10-CM

## 2021-10-14 NOTE — Progress Notes (Signed)
Subjective:    Patient ID: Michael [name] is a 66 y.o. male.    With a history of diabetes hypertension TIA and spinal stenosis with left-sided weakness who is seen today for exam and treatment of his lower extremity.  Relates his blood glucose has improved recently and has been well maintained.  Patient with complaint of dystrophic tender nails which are difficult for him to well manage those hyperkeratotic lesion Sub fifth right greater than left.        The following portions of the patient's history were reviewed and updated as appropriate: allergies, current medications, past family history, past medical history, past social history, past surgical history, and problem list.    Review of Systems   Constitutional: Negative.    Respiratory: Negative.     Cardiovascular: Negative.    Musculoskeletal: Negative.    Skin: Negative.    Allergic/Immunologic: Negative.    Neurological:  Positive for numbness.   Hematological: Negative.    All other systems reviewed and are negative.    Pulse 72, temperature 98.6 F (37 C), temperature source Temporal, weight 99.2 kg (218 lb 12.8 oz), SpO2 98 %.        Objective:    Physical Exam  With warm dry supple skin over this lower extremity.  Patient with nails that are thick dystrophic discolored and elongated x10 with incurvation and thickness relieved with debridement.  Patient with hyperkeratotic lesion's above fifth thumb bilaterally with right greater than left both closed with debridement.  Patient with palpable pulses with mild edema but without ischemic changes or claudication.  Patient with protective sensation altered to distally with monofilament with numbness in these feet today.  Patient with prominent fifth metatarsal head today leading to this area pressure.      Assessment:       1. Diabetic peripheral neuropathy associated with type 2 diabetes mellitus    2. Onychomycosis    3. Ingrown nail    4. Foot drop, left foot    5. Tailor's bunion of both feet    6.  Acquired keratoderma    7. Pain in both feet          Plan:       Consent we did debride nails x10 without incident taking care to reduce thickness and incurvation to provide relief.  Today after appropriate prep and anesthesia we did evaluate this hyperkeratotic lesions up fifth thumb bilaterally before performing sharp debridement to the cyst site with a #15 blade removing his nonvital hyperkeratotic tissue.  Discussed the import of regular foot exams to prevent complications and advised to call with new concerns or complaints.

## 2021-10-28 ENCOUNTER — Other Ambulatory Visit (INDEPENDENT_AMBULATORY_CARE_PROVIDER_SITE_OTHER): Payer: Self-pay | Admitting: Family

## 2021-10-28 ENCOUNTER — Other Ambulatory Visit (HOSPITAL_COMMUNITY): Payer: Self-pay | Admitting: Cardiovascular Disease

## 2021-10-28 DIAGNOSIS — I10 Essential (primary) hypertension: Secondary | ICD-10-CM

## 2021-10-28 NOTE — Telephone Encounter (Signed)
[facility]  [facility]    Recent labs requested from PCP.     Dario Ave, RN, 10/28/2021, 08:49  Office [phone]   Fax [phone]

## 2021-10-28 NOTE — Telephone Encounter (Signed)
Last office visit on 05/19/21

## 2021-10-28 NOTE — Telephone Encounter (Signed)
Labs received via fax from PCP.  Olive Bass, RN
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
## 2021-11-12 ENCOUNTER — Other Ambulatory Visit: Payer: Self-pay

## 2021-11-12 ENCOUNTER — Ambulatory Visit (INDEPENDENT_AMBULATORY_CARE_PROVIDER_SITE_OTHER): Payer: Medicare PPO | Admitting: Cardiovascular Disease

## 2021-11-12 ENCOUNTER — Encounter (INDEPENDENT_AMBULATORY_CARE_PROVIDER_SITE_OTHER): Payer: Self-pay | Admitting: Cardiovascular Disease

## 2021-11-12 DIAGNOSIS — E119 Type 2 diabetes mellitus without complications: Secondary | ICD-10-CM

## 2021-11-12 DIAGNOSIS — G4733 Obstructive sleep apnea (adult) (pediatric): Secondary | ICD-10-CM

## 2021-11-12 DIAGNOSIS — E785 Hyperlipidemia, unspecified: Secondary | ICD-10-CM

## 2021-11-12 MED ORDER — ATORVASTATIN 40 MG TABLET: 80.0000 mg | ORAL_TABLET | Freq: Every evening | ORAL | 1 refills | Status: DC

## 2021-11-16 ENCOUNTER — Ambulatory Visit (INDEPENDENT_AMBULATORY_CARE_PROVIDER_SITE_OTHER): Payer: Self-pay | Admitting: Cardiovascular Disease

## 2021-11-16 NOTE — Telephone Encounter (Signed)
Patient was put on Eliquis 2.5 mg BID for possible DVT last year. No doppler completed, symptoms resolved. Per Dr. Howie Ill, continue Plavix and Aspirin and stop Eliquis. Will also make patient's PCP aware.    Angelene Giovanni, RN

## 2022-01-27 ENCOUNTER — Ambulatory Visit: Payer: No Typology Code available for payment source | Admitting: Primary Podiatric Medicine

## 2022-02-07 ENCOUNTER — Encounter (HOSPITAL_BASED_OUTPATIENT_CLINIC_OR_DEPARTMENT_OTHER): Payer: Self-pay | Admitting: Gastroenterology

## 2022-02-07 ENCOUNTER — Ambulatory Visit
Admission: RE | Admit: 2022-02-07 | Discharge: 2022-02-07 | Disposition: A | Discharge status: Home / Self Care | Payer: No Typology Code available for payment source | Source: Ambulatory Visit | Attending: Gastroenterology | Admitting: Gastroenterology

## 2022-02-07 ENCOUNTER — Ambulatory Visit (HOSPITAL_BASED_OUTPATIENT_CLINIC_OR_DEPARTMENT_OTHER): Payer: No Typology Code available for payment source | Admitting: Anesthesiology

## 2022-02-07 ENCOUNTER — Encounter (HOSPITAL_BASED_OUTPATIENT_CLINIC_OR_DEPARTMENT_OTHER)
Admission: RE | Disposition: A | Discharge status: Home / Self Care | Payer: Self-pay | Source: Ambulatory Visit | Attending: Gastroenterology

## 2022-02-07 DIAGNOSIS — R1310 Dysphagia, unspecified: Secondary | ICD-10-CM | POA: Insufficient documentation

## 2022-02-07 HISTORY — PX: EGD: SHX3789

## 2022-02-07 SURGERY — DONT USE, USE 1095-ESOPHAGOGASTRODUODENOSCOPY (EGD), DIAGNOSTIC
Anesthesia: Anesthesia MAC / Sedation | Site: Abdomen | Wound class: Clean Contaminated

## 2022-02-07 MED ORDER — ALBUTEROL SULFATE (2.5 MG/3ML) 0.083% IN NEBU
2.5000 mg | INHALATION_SOLUTION | RESPIRATORY_TRACT | Status: DC | PRN
Start: 2022-02-07 — End: 2022-02-07

## 2022-02-07 MED ORDER — PROPOFOL 200 MG/20ML IV EMUL
INTRAVENOUS | Status: AC
Start: 2022-02-07 — End: ?
  Filled 2022-02-07: qty 20

## 2022-02-07 MED ORDER — SODIUM CHLORIDE (PF) 0.9 % IJ SOLN
3.0000 mL | Freq: Two times a day (BID) | INTRAMUSCULAR | Status: DC
Start: 2022-02-07 — End: 2022-02-07

## 2022-02-07 MED ORDER — SODIUM CHLORIDE 0.9 % IV SOLN
INTRAVENOUS | Status: DC
Start: 2022-02-07 — End: 2022-02-07

## 2022-02-07 MED ORDER — ONDANSETRON HCL 4 MG/2ML IJ SOLN
4.0000 mg | Freq: Once | INTRAMUSCULAR | Status: DC | PRN
Start: 2022-02-07 — End: 2022-02-07

## 2022-02-07 MED ORDER — ONDANSETRON 4 MG PO TBDP
4.0000 mg | ORAL_TABLET | Freq: Once | ORAL | Status: DC | PRN
Start: 2022-02-07 — End: 2022-02-07

## 2022-02-07 MED ORDER — PROPOFOL 200 MG/20ML IV EMUL
INTRAVENOUS | Status: DC | PRN
Start: 2022-02-07 — End: 2022-02-07
  Administered 2022-02-07: 40 mg via INTRAVENOUS
  Administered 2022-02-07: 120 mg via INTRAVENOUS

## 2022-02-07 SURGICAL SUPPLY — 58 items
APOLLO TISSUE HELIX (Supply) IMPLANT
BASKET MED RETRIEV HEX 45X15 (Supply) IMPLANT
BRUSH HEDGEHOG DUAL END (Supply) ×1 IMPLANT
CAP SCRAPING MED (Supply) IMPLANT
CAPSULE BRAVO (Supply) IMPLANT
CATH BALLOON DILATATION 5837 (Supply) IMPLANT
CATH BARRX 360 RFA EXPRESS (Supply) IMPLANT
CATH BARRX 60 RFA FOCA (Supply) IMPLANT
CATH BARRX 90 RFA FOCAL (Supply) IMPLANT
CATH BARRX ULTRA-LONG RFA FOCA (Supply) IMPLANT
CATH CROSPON ENDO FLIP (Supply) IMPLANT
CATH GOLD PROBE INJECTION 10F (Supply) IMPLANT
CLEANER ENZYMATIC BEDSIDE (Supply) ×1 IMPLANT
CLIP LIGATING RES 360 ULT 17MM (Supply) IMPLANT
CONN IRRG TORRENT 1WAYVAL ENDO (Supply) ×1 IMPLANT
CONNECTOR QUICK PORT (Supply) ×1 IMPLANT
CRE BALLOON 10-12 DILATOR 5835 (Supply) IMPLANT
CRE BALLOON 12-15 DILATOR 5836 (Supply) IMPLANT
CRE BALLOON 18-20 DILATOR 5838 (Supply) IMPLANT
CRE BALLOON 6-8 DILAT 5833 (Supply) IMPLANT
CRE BALLOON 8-10 DILAT 5834 (Supply) IMPLANT
CRE ESOPH PYLORIC COL 10-12 (Supply) IMPLANT
CRE ESOPH PYLORIC COL 12-15 (Supply) IMPLANT
CRE ESOPH PYLORIC COL 15-18 (Supply) IMPLANT
CRE ESOPH PYLORIC COL 6-8 (Supply) IMPLANT
CRE ESOPH PYLORIC COL 8-10 (Supply) IMPLANT
DEVICE GRASP RAPTOR 2.4X160 (Supply) IMPLANT
DEVICE TALON GRASP 2.4X160 (Supply) IMPLANT
DIL CRE 18-20 PYL CLN BIL 5871 (Supply) IMPLANT
DREAMWIRE STR .035 450CM (Supply) IMPLANT
FORCEP BIOPSY HOT RADIAL JAW 4 (Supply) IMPLANT
FORCEP BIOPSY RAD JAW 1333-40 (Supply) IMPLANT
FORCEP RAT TOOTH GRASP 2.4 (Supply) IMPLANT
FORCEP RESCUE RAT/ALL 8X230 (Supply) IMPLANT
GUIDEWIRE .038 (Supply) IMPLANT
KIT PEG 18 FR (Supply) IMPLANT
KIT STD PEG 20FR PULL (Supply) IMPLANT
MARKER ENDOSCOPIC SPOT (Supply) IMPLANT
NDL INTERJECT SCLERO 25G (Supply) IMPLANT
OVERTUBE GUARD ESOPH 10.0-17.9 (Supply) IMPLANT
OVERTUBE GUARD ESOPH 8.6-10.00 (Supply) IMPLANT
PAD CLINCH ENDO TRASPORT (Supply) ×1 IMPLANT
PEG TUBE 18F GASTRO ENDOV (Supply) IMPLANT
PROBE CIRCUMFRENTIAL (Supply) IMPLANT
PROBE SIDE FIRE (Supply) IMPLANT
PROBE STRAIGHT FIRE APC (Supply) IMPLANT
RESCUENET RETRIEVAL  2.5X230 (Supply) IMPLANT
SCISSOR ENZIZOR 2.6MMX236CM (Supply) IMPLANT
SNARE CAPTIVATOR ERM (Supply) IMPLANT
SPEEDBAND (Supply) IMPLANT
SUT APOLLO 2.0 (Supply) IMPLANT
SUT APOLLO OVERSTITCH CINCH (Supply) IMPLANT
SYS APOLLO OVERSTITCH SUTURE (Supply) IMPLANT
SYS APOLLO OVERTUBE ACCESS (Supply) IMPLANT
SYSTEM INFLATION ALLIANCE II (Supply) IMPLANT
TUBING IRRIGATION TORRENT (Supply) ×1 IMPLANT
VALVE DISP CLEANING BIOGUARD (Supply) ×1 IMPLANT
VALVE OLYMPUS DISP A/W/S/ BIO (Supply) ×1 IMPLANT

## 2022-02-07 NOTE — Transfer of Care (Signed)
Anesthesia Transfer of Care Note    Patient: Michael [name]    Last vitals:   Vitals:    02/07/22 1234   BP: (!) 158/91   Pulse: 94   Resp: 16   Temp: 37 C (98.6 F)   SpO2: 95%       Oxygen: Nasal Cannula     Mental Status:awake and alert     Airway: Natural    Cardiovascular Status:  stable

## 2022-02-07 NOTE — Anesthesia Postprocedure Evaluation (Signed)
Anesthesia Post Evaluation    Patient: Michael [name]    Procedure(s):  EGD/ WITH DIL    Anesthesia type: MAC    Last Vitals:   Vitals Value Taken Time   BP 158/91 02/07/22 1234   Temp 37 C (98.6 F) 02/07/22 1234   Pulse 94 02/07/22 1234   Resp 16 02/07/22 1234   SpO2 95 % 02/07/22 1234                 Anesthesia Post Evaluation:     Patient Evaluated: bedside  Patient Participation: complete - patient participated  Level of Consciousness: awake and alert  Pain Score: 0  Pain Management: adequate    Airway Patency: patent        Anesthetic complications: No      PONV Status: none    Cardiovascular status: acceptable  Respiratory status: acceptable  Hydration status: acceptable          Signed by: Virgilio Belling., MD, 02/07/2022 12:35 PM

## 2022-02-07 NOTE — Anesthesia Preprocedure Evaluation (Signed)
Anesthesia Evaluation    AIRWAY    Mallampati: I    TM distance: >3 FB  Neck ROM: full  Mouth Opening:full   CARDIOVASCULAR    cardiovascular exam normal       DENTAL    no notable dental hx               PULMONARY    pulmonary exam normal     OTHER FINDINGS                                      Relevant Problems   NEURO/PSYCH   (+) TIA (transient ischemic attack)      CARDIO   (+) CAD (coronary artery disease)   (+) Hypertension   (+) Unstable angina      ENDO   (+) Type 2 diabetes mellitus   (+) Type 2 diabetes mellitus with diabetic neuropathy               Anesthesia Plan    ASA 2     MAC                             Post op pain management: per surgeon    informed consent obtained                   Signed by: Virgilio Belling., MD 02/07/22 10:19 AM

## 2022-02-07 NOTE — Discharge Instr - AVS First Page (Signed)
Endoscopy Discharge Instructions    After you leave the hospital:  Due to the effects of the sedatives, you may feel tired for the remainder of today.   We recommend you go home after discharge  It is advisable to have supervision or access to seek help for a few hours after discharge  Do not drive, operate machinery, or sign important documents until tomorrow.  Resume your normal activities / work tomorrow  Please rest and drink extra fluids today.    Avoid alcohol today.  Unless otherwise guided under the recommendation section of your procedure report, start with light foods and advance to your regular diet as tolerated.  Refer to the recommendation section of the procedure report for medication information.  IV Site  Initial IV site bruising and tenderness is expected and should ease over time  Warm compress may help.   Avoid watches / bracelets to area   If site bleeds after removal apply consistent pressure for several minutes and replace dressing  Notify provider if:   Insertion site is hot to the touch or red streaks in your skin near the IV site  Drainage from the IV site   Swelling, pain, or redness at the IV site that worsens over time.   Fever of 100.4F (38C) or higher    Obstructive sleep apnea -Medicines you were given may cause you to have more apnea spells that may last longer than normal.    Unless otherwise instructed, keep using the continuous positive airway pressure (CPAP) device when you sleep day or night.   Talk with your provider before taking any pain medicine, muscle relaxants, or sedatives.     Seeking Medical Attention: -   you should be back to your usual activity within 24 hours.     Nausea / Vomiting: - try small amounts of bland foods like crackers or toast, & liquids such as soda, electrolyte replacement drinks or ice chips. Call for:  New onset or ongoing nausea and vomiting   The symptoms worsen - cold skin, confusion, blood or unexplainable black appearing  vomit  Bleeding:  Passing of blood or clots  or a change in stool consistency and or black in color  Vomiting or spitting up blood  Infection:  Fever- temperature of 100.4 or greater  Pain / other:  New onset of pain that does not subside over time or prevents you from normal activity or eating  Shortness of breath or breathing trouble  Weakness, feeling faint, passing out  Swollen or distended abdomen    For assistance with concerns:  During business hours, call your physician's office.   After-hours call [facility] [phone] and a representative will be contacted  For immediate or urgent care call 911

## 2022-02-08 ENCOUNTER — Encounter (HOSPITAL_BASED_OUTPATIENT_CLINIC_OR_DEPARTMENT_OTHER): Payer: Self-pay | Admitting: Gastroenterology

## 2022-03-03 ENCOUNTER — Ambulatory Visit (RURAL_HEALTH_CENTER): Payer: No Typology Code available for payment source | Admitting: Primary Podiatric Medicine

## 2022-03-31 ENCOUNTER — Other Ambulatory Visit (INDEPENDENT_AMBULATORY_CARE_PROVIDER_SITE_OTHER): Payer: Self-pay | Admitting: Cardiovascular Disease

## 2022-03-31 DIAGNOSIS — I2510 Atherosclerotic heart disease of native coronary artery without angina pectoris: Secondary | ICD-10-CM

## 2022-03-31 NOTE — Telephone Encounter (Unsigned)
Last office visit- 11/12/2021

## 2022-04-20 ENCOUNTER — Encounter (HOSPITAL_COMMUNITY): Payer: Self-pay

## 2022-05-02 ENCOUNTER — Other Ambulatory Visit (INDEPENDENT_AMBULATORY_CARE_PROVIDER_SITE_OTHER): Payer: Self-pay | Admitting: Cardiovascular Disease

## 2022-05-03 NOTE — Telephone Encounter (Signed)
Last office visit- 11/12/2021- Messaged front desk to schedule

## 2022-05-05 ENCOUNTER — Encounter (HOSPITAL_COMMUNITY): Payer: Self-pay

## 2022-05-18 ENCOUNTER — Emergency Department (HOSPITAL_COMMUNITY): Payer: Medicare PPO

## 2022-05-18 ENCOUNTER — Encounter (HOSPITAL_COMMUNITY): Payer: Self-pay

## 2022-05-18 ENCOUNTER — Emergency Department
Admission: EM | Admit: 2022-05-18 | Discharge: 2022-05-18 | Disposition: A | Discharge status: Home / Self Care | Payer: Medicare PPO | Attending: Medical | Admitting: Medical

## 2022-05-18 ENCOUNTER — Other Ambulatory Visit: Payer: Self-pay

## 2022-05-18 DIAGNOSIS — N400 Enlarged prostate without lower urinary tract symptoms: Secondary | ICD-10-CM | POA: Insufficient documentation

## 2022-05-18 DIAGNOSIS — K5730 Diverticulosis of large intestine without perforation or abscess without bleeding: Secondary | ICD-10-CM | POA: Insufficient documentation

## 2022-05-18 DIAGNOSIS — E119 Type 2 diabetes mellitus without complications: Secondary | ICD-10-CM | POA: Insufficient documentation

## 2022-05-18 DIAGNOSIS — J9811 Atelectasis: Secondary | ICD-10-CM | POA: Insufficient documentation

## 2022-05-18 DIAGNOSIS — N3289 Other specified disorders of bladder: Secondary | ICD-10-CM | POA: Insufficient documentation

## 2022-05-18 DIAGNOSIS — M48061 Spinal stenosis, lumbar region without neurogenic claudication: Secondary | ICD-10-CM | POA: Insufficient documentation

## 2022-05-18 DIAGNOSIS — Z955 Presence of coronary angioplasty implant and graft: Secondary | ICD-10-CM | POA: Insufficient documentation

## 2022-05-18 DIAGNOSIS — Z794 Long term (current) use of insulin: Secondary | ICD-10-CM | POA: Insufficient documentation

## 2022-05-18 DIAGNOSIS — K219 Gastro-esophageal reflux disease without esophagitis: Secondary | ICD-10-CM | POA: Insufficient documentation

## 2022-05-18 DIAGNOSIS — R109 Unspecified abdominal pain: Secondary | ICD-10-CM | POA: Insufficient documentation

## 2022-05-18 DIAGNOSIS — I959 Hypotension, unspecified: Secondary | ICD-10-CM | POA: Insufficient documentation

## 2022-05-18 DIAGNOSIS — K5790 Diverticulosis of intestine, part unspecified, without perforation or abscess without bleeding: Secondary | ICD-10-CM

## 2022-05-18 DIAGNOSIS — I2510 Atherosclerotic heart disease of native coronary artery without angina pectoris: Secondary | ICD-10-CM | POA: Insufficient documentation

## 2022-05-18 DIAGNOSIS — E785 Hyperlipidemia, unspecified: Secondary | ICD-10-CM | POA: Insufficient documentation

## 2022-05-18 DIAGNOSIS — I10 Essential (primary) hypertension: Secondary | ICD-10-CM | POA: Insufficient documentation

## 2022-05-18 DIAGNOSIS — G4733 Obstructive sleep apnea (adult) (pediatric): Secondary | ICD-10-CM | POA: Insufficient documentation

## 2022-05-18 DIAGNOSIS — Z8673 Personal history of transient ischemic attack (TIA), and cerebral infarction without residual deficits: Secondary | ICD-10-CM | POA: Insufficient documentation

## 2022-05-18 DIAGNOSIS — Z9641 Presence of insulin pump (external) (internal): Secondary | ICD-10-CM | POA: Insufficient documentation

## 2022-05-18 LAB — CBC WITH DIFF
BASOPHIL #: 0.08 10*3/uL (ref 0.00–0.30)
BASOPHIL %: 1.2 % (ref 0.0–3.0)
EOSINOPHIL #: 0.28 10*3/uL (ref 0.00–0.80)
EOSINOPHIL %: 4.1 % (ref 0.0–7.0)
HCT: 44.9 % (ref 39.0–59.5)
HGB: 16 g/dL (ref 13.0–17.0)
LYMPHOCYTE #: 2.66 10*3/uL (ref 0.60–5.10)
LYMPHOCYTE %: 38.3 % (ref 15.0–46.0)
MCH: 29.3 pg (ref 28.0–35.0)
MCHC: 35.7 g/dL (ref 33.0–37.0)
MCV: 82.1 fL (ref 80.0–100.0)
MONOCYTE #: 0.43 10*3/uL (ref 0.10–1.70)
MONOCYTE %: 6.2 % (ref 3.0–15.0)
MPV: 6.3 fL — ABNORMAL LOW (ref 8.0–12.0)
NEUTROPHIL #: 3.49 10*3/uL (ref 1.70–8.60)
NEUTROPHIL %: 50.3 % (ref 42.0–78.0)
PLATELETS: 200 10*3/uL (ref 130–440)
RBC: 5.46 10*6/uL — ABNORMAL HIGH (ref 4.00–5.30)
RDW: 10.6 % — ABNORMAL LOW (ref 12.0–15.0)
WBC: 6.9 10*3/uL (ref 4.0–11.0)

## 2022-05-18 LAB — URINALYSIS, MACROSCOPIC
BILIRUBIN: NEGATIVE mg/dL
BLOOD: NEGATIVE mg/dL
GLUCOSE: >=1000 mg/dL — AB
KETONES: NEGATIVE mg/dL
LEUKOCYTES: NEGATIVE WBCs/uL
NITRITE: NEGATIVE
PH: 6
PROTEIN: NEGATIVE mg/dL
SPECIFIC GRAVITY: 1.01 (ref 1.005–1.030)

## 2022-05-18 LAB — LIPASE: LIPASE: 24 U/L (ref 10–60)

## 2022-05-18 LAB — COMPREHENSIVE METABOLIC PANEL, NON-FASTING
ALBUMIN: 4 g/dL (ref 3.4–4.8)
ALKALINE PHOSPHATASE: 84 U/L (ref 45–115)
ALT (SGPT): 28 U/L (ref 10–55)
ANION GAP: 8 mmol/L (ref 4–13)
AST (SGOT): 26 U/L (ref 8–45)
BILIRUBIN TOTAL: 3.2 mg/dL — ABNORMAL HIGH (ref 0.3–1.3)
BUN/CREA RATIO: 7 (ref 6–22)
BUN: 7 mg/dL — ABNORMAL LOW (ref 8–25)
CALCIUM: 8.8 mg/dL (ref 8.6–10.3)
CHLORIDE: 108 mmol/L (ref 96–111)
CO2 TOTAL: 27 mmol/L (ref 23–31)
CREATININE: 1 mg/dL (ref 0.75–1.35)
ESTIMATED GFR - MALE: 82 mL/min/BSA (ref >=60)
GLUCOSE: 85 mg/dL (ref 65–125)
POTASSIUM: 3.8 mmol/L (ref 3.5–5.1)
PROTEIN TOTAL: 7.8 g/dL (ref 6.0–8.0)
SODIUM: 143 mmol/L (ref 136–145)

## 2022-05-18 LAB — LIGHT GREEN TOP TUBE

## 2022-05-18 LAB — URINALYSIS, MICROSCOPIC
BACTERIA: NEGATIVE /hpf
RBCS: NONE SEEN /hpf

## 2022-05-18 LAB — GOLD TOP TUBE

## 2022-05-18 LAB — BLUE TOP TUBE

## 2022-05-18 MED ORDER — IOHEXOL 350 MG IODINE/ML INTRAVENOUS SOLUTION
50.0000 mL | INTRAVENOUS | Status: AC
Start: 2022-05-18 — End: 2022-05-18
  Administered 2022-05-18: 50 mL via INTRAVENOUS

## 2022-05-18 MED ORDER — SODIUM CHLORIDE 0.9 % IV BOLUS
500.0000 mL | INJECTION | Status: AC
Start: 2022-05-18 — End: 2022-05-18
  Administered 2022-05-18: 0 mL via INTRAVENOUS
  Administered 2022-05-18: 500 mL via INTRAVENOUS

## 2022-05-18 MED ORDER — SODIUM CHLORIDE 0.9 % IV BOLUS
500.0000 mL | INJECTION | Status: AC
Start: 2022-05-18 — End: 2022-05-18
  Administered 2022-05-18: 500 mL via INTRAVENOUS
  Administered 2022-05-18: 0 mL via INTRAVENOUS

## 2022-05-18 MED ORDER — KETOROLAC 30 MG/ML (1 ML) INJECTION SOLUTION
15.0000 mg | INTRAMUSCULAR | Status: AC
Start: 2022-05-18 — End: 2022-05-18
  Administered 2022-05-18: 15 mg via INTRAVENOUS
  Filled 2022-05-18: qty 1

## 2022-05-18 MED ORDER — SODIUM CHLORIDE 0.9 % (FLUSH) INJECTION SYRINGE
1.0000 mL | INJECTION | INTRAMUSCULAR | Status: DC | PRN
Start: 2022-05-18 — End: 2022-05-18

## 2022-05-18 MED ORDER — ONDANSETRON HCL (PF) 4 MG/2 ML INJECTION SOLUTION
4.0000 mg | INTRAMUSCULAR | Status: AC
Start: 2022-05-18 — End: 2022-05-18
  Administered 2022-05-18: 4 mg via INTRAVENOUS
  Filled 2022-05-18: qty 2

## 2022-05-18 NOTE — ED Triage Notes (Signed)
Patient states : " I am having sharp pains on the right side of my stomach. It radiates into my back. It woke me up from sleep last night and I have a bit of nausea. "

## 2022-05-18 NOTE — ED Provider Notes (Signed)
[facility]  ED Primary Provider Note  History of Present Illness   Chief Complaint   Patient presents with    Abdominal Pain     Arrival: The patient arrived by Car  Edwin [name] is a 67 y.o. male who had concerns including Abdominal Pain.  Patient with a history of diabetes on an insulin pump, hypertension, hyperlipidemia, coronary artery disease status post stent x4, CVA/TIA, GERD, and OSA who presents with a 3-4 day history of right-sided abdominal pain with radiating right back pain associated with nausea.  Patient is status post cholecystectomy.    Review of Systems   Constitutional: No fever, chills or weakness   Skin: No rash or diaphoresis  HENT: No headaches, or congestion  Eyes: No vision changes or photophobia   Cardio: No chest pain, palpitations or leg swelling   Respiratory: No cough, wheezing or SOB  GI:  Positive for right-sided abdominal pain with radiating right sided back pain and associated with nausea.  No vomiting or stool changes  GU:  No dysuria, hematuria, or increased frequency  MSK: No muscle aches, joint or back pain  Neuro: No seizures, LOC, numbness, tingling, or focal weakness  Psychiatric: No depression, SI or substance abuse  All other systems reviewed and are negative.    Historical Data   History Reviewed This Encounter: Medical History  Surgical History  Family History  Social History    Physical Exam   ED Triage Vitals [05/18/22 1306]   BP (Non-Invasive) 101/68   Heart Rate 97   Respiratory Rate 19   Temperature (!) 35.7 C (96.2 F)   SpO2 98 %   Weight 92.5 kg (204 lb)   Height 1.727 m (5\' 8" )       Constitutional:  67 y.o. male who appears in no distress. Normal color, no cyanosis.   HENT:   Head: Normocephalic and atraumatic.   Mouth/Throat: Oropharynx is clear and moist.   Eyes: EOMI, PERRL   Neck: Trachea midline. Neck supple.  Cardiovascular: RRR, No murmurs, rubs or gallops. Intact distal pulses.  Pulmonary/Chest: BS equal bilaterally.  No respiratory distress. No wheezes, rales.  Abdominal: Bowel sounds present and normal. Abdomen soft, mild right-sided abdominal tenderness, no rebound and no guarding.  No peritoneal signs.  Back: No midline spinal tenderness, no paraspinal tenderness.  Genitourinary:  Mild right flank tenderness.          Musculoskeletal: No edema, tenderness or deformity.  Skin: warm and dry. No rash, erythema, pallor or cyanosis  Psychiatric: normal mood and affect. Behavior is normal.   Neurological: Patient alert and responsive, moving all extremities equally and fully, normal gait  Patient Data     Labs Ordered/Reviewed   COMPREHENSIVE METABOLIC PANEL, NON-FASTING - Abnormal; Notable for the following components:       Result Value    BUN 7 (*)     BILIRUBIN TOTAL 3.2 (*)     All other components within normal limits   CBC WITH DIFF - Abnormal; Notable for the following components:    RBC 5.46 (*)     RDW 10.6 (*)     MPV 6.3 (*)     All other components within normal limits   URINALYSIS, MACROSCOPIC - Abnormal; Notable for the following components:    GLUCOSE >=1000 (*)     All other components within normal limits   URINALYSIS, MICROSCOPIC - Abnormal; Notable for the following components:    SQUAMOUS EPITHELIAL CELLS Few (*)  All other components within normal limits   LIPASE - Normal   CBC/DIFF    Narrative:     The following orders were created for panel order CBC/DIFF.  Procedure                               Abnormality         Status                     ---------                               -----------         ------                     CBC WITH KGYJ[[id number]]                Abnormal            Final result                 Please view results for these tests on the individual orders.   URINALYSIS, MACROSCOPIC AND MICROSCOPIC W/CULTURE REFLEX    Narrative:     The following orders were created for panel order URINALYSIS, MACROSCOPIC AND MICROSCOPIC W/CULTURE REFLEX.  Procedure                                Abnormality         Status                     ---------                               -----------         ------                     URINALYSIS, MACROSCOPIC[[id number]]      Abnormal            Final result               URINALYSIS, MICROSCOPIC[[id number]]      Abnormal            Final result                 Please view results for these tests on the individual orders.   EXTRA TUBES - CCM ONLY    Narrative:     The following orders were created for panel order Boyd.  Procedure                               Abnormality         Status                     ---------                               -----------         ------                     Oren Binet  TOP BJSE[[id number]]                                    In process                 GOLD TOP WVPX[[id number]]                                    In process                 LIGHT GREEN TOP IOEV[[id number]]                             In process                   Please view results for these tests on the individual orders.   BLUE TOP TUBE   GOLD TOP TUBE   LIGHT GREEN TOP TUBE     XR CHEST AP   Final Result by Edi, Radresults In (01/31 1420)   No acute parenchymal process.                  Radiologist location ID: [id number]         CT ABDOMEN PELVIS W IV CONTRAST   Final Result by Edi, Radresults In (01/31 1420)   No acute intra-abdominal process.                           The CT exam was performed using one or more the following a dose reduction techniques: Automated exposure control, adjustment of the mA and/or kV according to the patient's size, or use of iterative reconstruction technique.             Radiologist location ID: [facility]           Medical Decision Making        Medical Decision Making  Amount and/or Complexity of Data Reviewed  Labs: ordered. Decision-making details documented in ED Course.     Details: CBC, CMP, lipase, and urinalysis.  Radiology: ordered. Decision-making details documented in ED Course.     Details: Chest x-ray.  CT of  the abdomen/pelvis with IV contrast.  ECG/medicine tests: ordered. Decision-making details documented in ED Course.     Details: Given 1/2 L of normal saline, IV Toradol, and IV Zofran.    Risk  Prescription drug management.    Patient was seen for a 3-4 day history of right-sided abdominal pain with radiating right flank pain associated with nausea.  Patient is afebrile and does not appear toxic.    Patient was given 1/2 L of IV normal saline IV Toradol, and IV Zofran with good relief.    I discussed results of labs and imaging with the patient.    Labs with WBC 6.9.  BMP normal.  LFTs normal with T bili 3.2 (chronic 3.1-5.3).  Lipase 24.  Urinalysis unremarkable.    CT of the abdomen/pelvis with IV contrast shows no acute findings with diverticulosis and lumbar spinal stenosis.    Patient was discharged home in stable condition.  Patient will follow up with his PCP in 1 week, sooner if needed.  Patient was advised to return to the emergency department for worsening or new symptoms.  Differential diagnoses include but not limited to appendicitis, UTI, pyelonephritis, kidney stone, diverticulitis, colitis, enterocolitis, ischemic bowel, small-bowel obstruction, bowel perforation GI bleed, biliary colic, early shingles, basilar pneumonia, muscular pain, spinal stenosis       Patient's blood pressure was 88/50 upon discharge.  Patient is not complaining of lightheaded or dizziness.  Patient was given another 1/2 L of IV normal saline.    Repeat blood pressure improved.    Patient was advised to hold his Lasix and metoprolol this evening.  Patient will monitor his blood pressure and follow up with his PCP.      Medications Administered in the ED   NS flush syringe (has no administration in time range)   ondansetron (ZOFRAN) 2 mg/mL injection (4 mg Intravenous Given 05/18/22 1322)   ketorolac (TORADOL) 30 mg/mL injection (15 mg Intravenous Given 05/18/22 1322)   NS bolus infusion 500 mL (0 mL Intravenous Stopped 05/18/22  1445)   iohexol (OMNIPAQUE 350) infusion (50 mL Intravenous Given 05/18/22 1410)   NS bolus infusion 500 mL (500 mL Intravenous New Bag/New Syringe 05/18/22 1450)     Clinical Impression   Right sided abdominal pain (Primary)   Diverticulosis   Right flank pain   Spinal stenosis of lumbar region, unspecified whether neurogenic claudication present   Hypotension, unspecified hypotension type       Disposition: Discharged

## 2022-05-18 NOTE — ED Nurses Note (Signed)
Patient discharged home with family.  AVS reviewed with patient/care giver.  A written copy of the AVS and discharge instructions was given to the patient/care giver.  Questions sufficiently answered as needed.  Patient/care giver encouraged to follow up with PCP as indicated.  In the event of an emergency, patient/care giver instructed to call 911 or go to the nearest emergency room.

## 2022-05-27 ENCOUNTER — Other Ambulatory Visit: Payer: Self-pay

## 2022-05-27 ENCOUNTER — Encounter (HOSPITAL_COMMUNITY): Payer: Self-pay

## 2022-05-27 ENCOUNTER — Emergency Department
Admission: EM | Admit: 2022-05-27 | Discharge: 2022-05-27 | Disposition: A | Discharge status: Home / Self Care | Payer: Medicare PPO | Attending: Emergency Medicine | Admitting: Emergency Medicine

## 2022-05-27 DIAGNOSIS — L0100 Impetigo, unspecified: Secondary | ICD-10-CM | POA: Insufficient documentation

## 2022-05-27 MED ORDER — MUPIROCIN 2 % TOPICAL OINTMENT
TOPICAL_OINTMENT | Freq: Two times a day (BID) | CUTANEOUS | 0 refills | Status: AC
Start: 2022-05-27 — End: 2022-06-01
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
## 2022-05-27 NOTE — ED Triage Notes (Signed)
Patient presents to the ER ambulatory with a runny nose and soreness x2 days. Patient states his nose is swollen as well. Patient denies fever, nausea, vomiting.

## 2022-05-27 NOTE — ED Nurses Note (Signed)
Patient discharged home with family.  AVS reviewed with patient/care giver.  A written copy of the AVS and discharge instructions was given to the patient/care giver.  Questions sufficiently answered as needed.  Patient/care giver encouraged to follow up with PCP as indicated.  In the event of an emergency, patient/care giver instructed to call 911 or go to the nearest emergency room.

## 2022-05-31 ENCOUNTER — Other Ambulatory Visit: Payer: Self-pay

## 2022-05-31 ENCOUNTER — Encounter (HOSPITAL_COMMUNITY): Payer: Self-pay

## 2022-05-31 ENCOUNTER — Emergency Department
Admission: EM | Admit: 2022-05-31 | Discharge: 2022-05-31 | Disposition: A | Discharge status: Home / Self Care | Payer: Medicare PPO | Attending: FAMILY MEDICINE | Admitting: FAMILY MEDICINE

## 2022-05-31 DIAGNOSIS — L739 Follicular disorder, unspecified: Secondary | ICD-10-CM | POA: Insufficient documentation

## 2022-05-31 MED ORDER — SULFAMETHOXAZOLE 800 MG-TRIMETHOPRIM 160 MG TABLET
1.0000 | ORAL_TABLET | Freq: Two times a day (BID) | ORAL | 0 refills | Status: AC
Start: 2022-05-31 — End: 2022-06-10

## 2022-05-31 NOTE — ED Triage Notes (Addendum)
Patient states "Thursday night my nose started swelling up inside, I went to [facility] and they diagnosed me with impetigo, the prescribed me medicine to put in my nose, but the pain is excruciating. My Upper gums are just really sore and the insides of my nose are so sore."      1055: Patient discharged home with patient. AVS reviewed with patient. A written copy of AVS and discharge instructions were given to patient. Prescriptions given x 1. Questions sufficiently answered as needed. Patient encouraged to follow-up with PCP as indicated. In the event of an emergency, Patient instructed to call 911 or go to the nearest ED.

## 2022-05-31 NOTE — ED Provider Notes (Signed)
[facility]  ED Primary Provider Note  History of Present Illness   Chief Complaint   Patient presents with    Nasal Pain     Edwin [name] is a 67 y.o. male who had concerns including Nasal Pain.  Arrival: The patient arrived by Car    The patient states he has had some pain and swelling of the nasal septum for a few days he went to the emergency room 2 days ago and they placed him on Bactroban ointment he states it is tender to the touch there has been some swelling it is worse with palpation and relieved with rest there has been no fever no chills no drainage from the site he does not recall a prior history of staph infections.  He denies fever or chills        History Reviewed This Encounter: Medical History  Surgical History  Family History  Social History    Physical Exam   ED Triage Vitals [05/31/22 1032]   BP (Non-Invasive) (!) 144/82   Heart Rate (!) 110   Respiratory Rate 18   Temperature 37.6 C (99.6 F)   SpO2 99 %   Weight 94.8 kg (209 lb)   Height 1.727 m (5' 8"$ )     Physical Exam  Vitals and nursing note reviewed.   Constitutional:       General: He is not in acute distress.     Appearance: Normal appearance.   HENT:      Head: Normocephalic and atraumatic.      Right Ear: Tympanic membrane and ear canal normal.      Left Ear: Tympanic membrane and ear canal normal.      Nose: No congestion or rhinorrhea.      Comments: There is folliculitis of the nasal septum bilaterally but no evidence of a drainable abscess     Mouth/Throat:      Mouth: Mucous membranes are moist.      Pharynx: Oropharynx is clear.   Eyes:      Extraocular Movements: Extraocular movements intact.      Conjunctiva/sclera: Conjunctivae normal.      Pupils: Pupils are equal, round, and reactive to light.   Cardiovascular:      Rate and Rhythm: Normal rate and regular rhythm.      Pulses: Normal pulses.      Heart sounds: Normal heart sounds. No murmur heard.  Pulmonary:      Effort: Pulmonary effort  is normal. No respiratory distress.      Breath sounds: Normal breath sounds. No wheezing, rhonchi or rales.   Abdominal:      General: Abdomen is flat. Bowel sounds are normal. There is no distension.      Palpations: Abdomen is soft.      Tenderness: There is no abdominal tenderness. There is no guarding or rebound.      Hernia: No hernia is present.   Musculoskeletal:         General: No swelling, tenderness, deformity or signs of injury. Normal range of motion.      Cervical back: Normal range of motion and neck supple. No rigidity.   Skin:     General: Skin is warm and dry.      Capillary Refill: Capillary refill takes less than 2 seconds.      Findings: No lesion or rash.   Neurological:      General: No focal deficit present.  Mental Status: He is alert and oriented to person, place, and time. Mental status is at baseline.      Cranial Nerves: No cranial nerve deficit.      Motor: No weakness.   Psychiatric:         Mood and Affect: Mood normal.         Behavior: Behavior normal.         Thought Content: Thought content normal.         Judgment: Judgment normal.       Patient Data   Labs Ordered/Reviewed - No data to display  No orders to display     Medical Decision Making        Medical Decision Making  Risk  Prescription drug management.      ED Course as of 05/31/22 1057   Tue May 31, 2022   1056 The patient was advised to continue Bactroban 3 times a day for another 10 days he was placed on Bactrim DS with outpatient follow-up              Clinical Impression   Folliculitis (Primary)       Disposition: Discharged

## 2022-06-01 ENCOUNTER — Emergency Department
Admission: EM | Admit: 2022-06-01 | Discharge: 2022-06-01 | Disposition: A | Discharge status: Home / Self Care | Payer: Medicare PPO | Attending: Emergency Medicine | Admitting: Emergency Medicine

## 2022-06-01 ENCOUNTER — Encounter (HOSPITAL_COMMUNITY): Payer: Self-pay

## 2022-06-01 ENCOUNTER — Other Ambulatory Visit: Payer: Self-pay

## 2022-06-01 DIAGNOSIS — E119 Type 2 diabetes mellitus without complications: Secondary | ICD-10-CM | POA: Insufficient documentation

## 2022-06-01 DIAGNOSIS — J340 Abscess, furuncle and carbuncle of nose: Secondary | ICD-10-CM | POA: Insufficient documentation

## 2022-06-01 MED ORDER — KETOROLAC 30 MG/ML (1 ML) INJECTION SOLUTION
30.0000 mg | INTRAMUSCULAR | Status: AC
Start: 2022-06-01 — End: 2022-06-01
  Administered 2022-06-01: 30 mg via INTRAMUSCULAR
  Filled 2022-06-01: qty 1

## 2022-06-01 MED ORDER — DOXYCYCLINE HYCLATE 100 MG TABLET
100.0000 mg | ORAL_TABLET | ORAL | Status: AC
Start: 2022-06-01 — End: 2022-06-01
  Administered 2022-06-01: 100 mg via ORAL
  Filled 2022-06-01: qty 1

## 2022-06-01 MED ORDER — DOXYCYCLINE HYCLATE 100 MG CAPSULE
100.0000 mg | ORAL_CAPSULE | Freq: Two times a day (BID) | ORAL | 0 refills | Status: AC
Start: 2022-06-01 — End: 2022-06-11

## 2022-06-01 MED ORDER — HYDROCODONE 5 MG-ACETAMINOPHEN 325 MG TABLET
1.0000 | ORAL_TABLET | Freq: Four times a day (QID) | ORAL | 0 refills | Status: DC | PRN
Start: 2022-06-01 — End: 2022-07-20

## 2022-06-01 NOTE — ED Provider Notes (Addendum)
[facility]  ED Primary Provider Note  History of Present Illness   Chief Complaint   Patient presents with    Facial Pain     Edwin [name] is a 67 y.o. male who had concerns including Facial Pain.  Arrival: The patient arrived by Car    This is a 67 year old man with a history of diabetes.  He is complaining of pain of his nose that has been present for about 5 days.  He was initially treated with mupirocin without much improvement.  Yesterday, he was prescribed Bactrim.  Today, he asks for pain medicine.  He states the discomfort is worse in his right nostril in his left.  It is sharp, moderate severity, worse with palpation, nonradiating, with no other modifying factors.  He denies other symptoms, including fever, trauma, sore throat, difficulty breathing, or other symptoms      Facial Pain      History Reviewed This Encounter: Medical History  Surgical History  Family History  Social History    Physical Exam   ED Triage Vitals [06/01/22 1905]   BP (Non-Invasive) 118/80   Heart Rate 96   Respiratory Rate 14   Temperature 37.1 C (98.7 F)   SpO2 99 %   Weight 92.5 kg (204 lb)   Height 1.727 m (5' 8"$ )     Physical Exam  Vitals and nursing note reviewed.   Constitutional:       General: He is not in acute distress.     Appearance: He is well-developed.   HENT:      Head: Normocephalic and atraumatic.      Nose:      Comments: He is erythema of his anterior nose, and a small abscess appears to be coming to a head inside of his right nostril.     Mouth/Throat:      Mouth: Mucous membranes are moist.   Eyes:      Conjunctiva/sclera: Conjunctivae normal.   Cardiovascular:      Rate and Rhythm: Normal rate and regular rhythm.      Heart sounds: No murmur heard.  Pulmonary:      Effort: Pulmonary effort is normal. No respiratory distress.      Breath sounds: Normal breath sounds.   Abdominal:      Palpations: Abdomen is soft.      Tenderness: There is no abdominal tenderness.    Musculoskeletal:         General: No swelling.      Cervical back: Neck supple.   Skin:     General: Skin is warm and dry.      Capillary Refill: Capillary refill takes less than 2 seconds.   Neurological:      Mental Status: He is alert.   Psychiatric:         Mood and Affect: Mood normal.       Patient Data   Labs Ordered/Reviewed - No data to display  No orders to display     Medical Decision Making        Medical Decision Making  Procedure note:  Incision and drainage.  Using a sterile needle, I unroofed the abscess, allowing a small amount of purulent drainage.  Well-tolerated.    I counseled him that if he gets any worse that he will likely need to be admitted and have an [facility] Th[facility] surgeon address this.  I am adding on doxycycline, and he states he is tolerated  hydrocodone with Tylenol well in the past, so I will prescribe that for pain relief.    Risk  Prescription drug management.               Medications Administered in the ED   ketorolac (TORADOL) 30 mg/mL injection (30 mg IntraMUSCULAR Given 06/01/22 1932)   doxycycline tablet (100 mg Oral Given 06/01/22 1931)     Clinical Impression   Nasal abscess (Primary)       Disposition: Discharged    Post ED follow up call on February 19th.  The patient states he is feeling much better.  He states he had purulent discharge from the nose after the drainage was done here in the ED, and has improved since that time.  I encouraged close follow up with his primary doctor, and return to the ED for any worsening symptoms

## 2022-06-01 NOTE — Discharge Instructions (Signed)
Follow up very closely with your doctor.  If you do not improve, we may need to admit her to the hospital for intravenous antibiotics and possible surgery.  Return to the ER for any worsening symptoms

## 2022-06-01 NOTE — ED Triage Notes (Signed)
Patient states : " Last Thursday, I started having facial pain. Today, I have pain in my gums and my entire face and nose is swollen. "

## 2022-06-01 NOTE — ED Nurses Note (Signed)
Patient discharged home with family.  AVS reviewed with patient/care giver.  A written copy of the AVS and discharge instructions was given to the patient/care giver.  Questions sufficiently answered as needed.  Patient/care giver encouraged to follow up with PCP as indicated.  In the event of an emergency, patient/care giver instructed to call 911 or go to the nearest emergency room.

## 2022-06-14 ENCOUNTER — Ambulatory Visit: Payer: Medicare PPO | Attending: Surgery | Admitting: Surgery

## 2022-06-14 ENCOUNTER — Other Ambulatory Visit: Payer: Self-pay

## 2022-06-14 ENCOUNTER — Encounter (INDEPENDENT_AMBULATORY_CARE_PROVIDER_SITE_OTHER): Payer: Self-pay | Admitting: Surgery

## 2022-06-14 DIAGNOSIS — Z86010 Personal history of colonic polyps: Secondary | ICD-10-CM

## 2022-06-14 DIAGNOSIS — K5790 Diverticulosis of intestine, part unspecified, without perforation or abscess without bleeding: Secondary | ICD-10-CM | POA: Insufficient documentation

## 2022-06-14 DIAGNOSIS — L0591 Pilonidal cyst without abscess: Secondary | ICD-10-CM | POA: Insufficient documentation
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
## 2022-06-27 ENCOUNTER — Encounter (INDEPENDENT_AMBULATORY_CARE_PROVIDER_SITE_OTHER): Payer: Self-pay | Admitting: Cardiovascular Disease

## 2022-06-27 ENCOUNTER — Other Ambulatory Visit: Payer: Self-pay

## 2022-06-27 ENCOUNTER — Ambulatory Visit (INDEPENDENT_AMBULATORY_CARE_PROVIDER_SITE_OTHER): Payer: Medicare PPO | Admitting: Cardiovascular Disease

## 2022-06-27 VITALS — BP 142/81 | HR 78 | Resp 16 | Wt 210.8 lb

## 2022-06-27 DIAGNOSIS — I2510 Atherosclerotic heart disease of native coronary artery without angina pectoris: Secondary | ICD-10-CM

## 2022-06-27 DIAGNOSIS — Z7982 Long term (current) use of aspirin: Secondary | ICD-10-CM

## 2022-06-27 DIAGNOSIS — Z7952 Long term (current) use of systemic steroids: Secondary | ICD-10-CM

## 2022-06-27 DIAGNOSIS — I517 Cardiomegaly: Secondary | ICD-10-CM

## 2022-06-27 DIAGNOSIS — I10 Essential (primary) hypertension: Secondary | ICD-10-CM

## 2022-06-27 DIAGNOSIS — E119 Type 2 diabetes mellitus without complications: Secondary | ICD-10-CM

## 2022-06-27 DIAGNOSIS — E785 Hyperlipidemia, unspecified: Secondary | ICD-10-CM

## 2022-06-27 DIAGNOSIS — I679 Cerebrovascular disease, unspecified: Secondary | ICD-10-CM

## 2022-06-27 DIAGNOSIS — Z79899 Other long term (current) drug therapy: Secondary | ICD-10-CM

## 2022-06-27 DIAGNOSIS — G4733 Obstructive sleep apnea (adult) (pediatric): Secondary | ICD-10-CM

## 2022-07-07 ENCOUNTER — Other Ambulatory Visit (INDEPENDENT_AMBULATORY_CARE_PROVIDER_SITE_OTHER): Payer: Self-pay | Admitting: Cardiovascular Disease

## 2022-07-07 DIAGNOSIS — I10 Essential (primary) hypertension: Secondary | ICD-10-CM

## 2022-07-07 NOTE — Telephone Encounter (Signed)
Last office visit- 06/27/2022

## 2022-07-11 ENCOUNTER — Ambulatory Visit (INDEPENDENT_AMBULATORY_CARE_PROVIDER_SITE_OTHER): Payer: Self-pay | Admitting: Cardiovascular Disease

## 2022-07-11 DIAGNOSIS — Z4509 Encounter for adjustment and management of other cardiac device: Secondary | ICD-10-CM | POA: Insufficient documentation

## 2022-07-11 NOTE — Telephone Encounter (Signed)
Pt wants his ILR explanted. He said he meant to ask you about it at his last appointment but forgot. The battery reached RRT last year. He feels like it is protruding and he said it's driving him crazy. Is it OK to put the order in? Thank you!    Assessment & Plan:      Edwin [name] is a 67 y.o. male with     CAD: work up as above  - denies CP/SOB  - continue Asa, Plavix, Metoprolol, Imdur, Ranexa, statin and Vascepa     Cerebrovascular disease: possible TIA 03/05/17; possible stroke 07/16/20 post cath; reported Lt facial numbness but MRI was not done due to claustrophobia  - Echo with bubble 3/31/2 was negative.   - no Afib on event monitors  - s/p ILR placement, no significant events, now battery is at RRT   - continue Asa, Plavix, statin, Vascepa     HTN w/ mild LVH: BP elevated today; reports controlled at home  - continue current meds  - instructed to monitor BP at home, keep a log, and bring to each visit     Dyslipidemia: [place] 08/10/21 total- 90, trig 218, HDL 26, LDL 30 (media)  - on atorvastatin 40 mg daily; lowered by PCP  - encouraged a whole food, plant-based diet and regular exercise  - reports recent lab work; advised to have copy faxed to our office      Leg pain/edema: likely arthritic / neuropathy pain.  - no further complaints  - ABI/PVR suggestive of no significant arterial insufficiency    - on Lasix 20 mg daily   - keep legs elevated while at rest and do toe lifts daily   - limit sodium intake      OSA:   - not using CPAP   - encouraged to follow up with pulmonology for new supplies     DM-II:   - encouraged a whole food, plant-based diet and regular exercise  - continue to follow PCP   - he reports last A1C of 6.2     H/o Gilbert syndrome:

## 2022-07-11 NOTE — Telephone Encounter (Signed)
Fortino Sic, MD  Fredderick Phenix, RN  Caller: Unspecified (Today,  2:46 PM)  Yes please. Thank you!

## 2022-07-20 ENCOUNTER — Other Ambulatory Visit: Payer: Self-pay

## 2022-07-20 ENCOUNTER — Inpatient Hospital Stay
Admission: RE | Admit: 2022-07-20 | Discharge: 2022-07-20 | Disposition: A | Discharge status: Home / Self Care | Payer: Medicare PPO | Source: Ambulatory Visit | Attending: Cardiovascular Disease | Admitting: Cardiovascular Disease

## 2022-07-20 ENCOUNTER — Encounter (HOSPITAL_COMMUNITY)
Admission: RE | Disposition: A | Discharge status: Home / Self Care | Payer: Self-pay | Source: Ambulatory Visit | Attending: Cardiovascular Disease

## 2022-07-20 DIAGNOSIS — G4733 Obstructive sleep apnea (adult) (pediatric): Secondary | ICD-10-CM | POA: Insufficient documentation

## 2022-07-20 DIAGNOSIS — E119 Type 2 diabetes mellitus without complications: Secondary | ICD-10-CM | POA: Insufficient documentation

## 2022-07-20 DIAGNOSIS — Z7984 Long term (current) use of oral hypoglycemic drugs: Secondary | ICD-10-CM | POA: Insufficient documentation

## 2022-07-20 DIAGNOSIS — Z7985 Long-term (current) use of injectable non-insulin antidiabetic drugs: Secondary | ICD-10-CM | POA: Insufficient documentation

## 2022-07-20 DIAGNOSIS — I10 Essential (primary) hypertension: Secondary | ICD-10-CM | POA: Insufficient documentation

## 2022-07-20 DIAGNOSIS — E785 Hyperlipidemia, unspecified: Secondary | ICD-10-CM | POA: Insufficient documentation

## 2022-07-20 DIAGNOSIS — Z4509 Encounter for adjustment and management of other cardiac device: Secondary | ICD-10-CM | POA: Insufficient documentation

## 2022-07-20 DIAGNOSIS — Z8673 Personal history of transient ischemic attack (TIA), and cerebral infarction without residual deficits: Secondary | ICD-10-CM | POA: Insufficient documentation

## 2022-07-20 DIAGNOSIS — I2510 Atherosclerotic heart disease of native coronary artery without angina pectoris: Secondary | ICD-10-CM | POA: Insufficient documentation

## 2022-07-20 SURGERY — REMOVAL OF INTRA-CARDIAC EVENT MONITOR

## 2022-07-20 MED ORDER — LIDOCAINE HCL 10 MG/ML (1 %) INJECTION SOLUTION
Freq: Once | INTRAMUSCULAR | Status: DC | PRN
Start: 2022-07-20 — End: 2022-07-20
  Administered 2022-07-20: 7 mL via INTRADERMAL

## 2022-07-20 MED ORDER — LIDOCAINE HCL 10 MG/ML (1 %) INJECTION SOLUTION
INTRAMUSCULAR | Status: AC
Start: 2022-07-20 — End: 2022-07-20
  Filled 2022-07-20: qty 10

## 2022-07-20 NOTE — Nurses Notes (Signed)
Pt received to holding, changed into gown. See flowsheet for VS and assessment. Report to Missy RN.

## 2022-07-20 NOTE — Discharge Summary (Signed)
[facility]  DISCHARGE SUMMARY    PATIENT NAME:  Edwin, [name]  MRN:  [id number]  DOB:  08-12-1955    ENCOUNTER DATE:  07/20/2022  INPATIENT ADMISSION DATE:   DISCHARGE DATE:  07/20/2022    ATTENDING PHYSICIAN: Fortino Sic, MD  SERVICE: [facility] CARDIOLOGY  PRIMARY CARE PHYSICIAN: Jacqulynn Cadet, MD       No lay caregiver identified.    PRIMARY DISCHARGE DIAGNOSIS: Encounter for loop recorder at end of battery life  Active Hospital Problems    Diagnosis Date Noted    Principal Problem: Encounter for loop recorder at end of battery life [Z45.09] 07/11/2022      Resolved Hospital Problems   No resolved problems to display.     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        Details   acetaminophen 500 mg Tablet  Commonly known as: TYLENOL   500 mg, Oral, EVERY 4 HOURS PRN  Refills: 0     albuterol sulfate 90 mcg/actuation oral inhaler  Commonly known as: PROVENTIL or VENTOLIN or PROAIR   1-2 Puffs, Inhalation, EVERY 6 HOURS PRN  Refills: 0     aspirin 81 mg Tablet, Delayed Release (E.C.)  Commonly known as: ECOTRIN   81 mg, Oral, DAILY  Refills: 0     atorvastatin 40 mg Tablet  Commonly known as: LIPITOR   80 mg, Oral, EVERY EVENING  Refills: 1     CALCIUM 300 ORAL   1,000 mg, Oral, DAILY  Refills: 0     cholecalciferol (vitamin D3) 25 mcg (1,000 unit) Tablet   1,000 Units, Oral, EVERY 7 DAYS  Refills: 0     clopidogreL 75 mg Tablet  Commonly known as: PLAVIX   75 mg, Oral, DAILY  Qty: 90 Tablet  Refills: 3     cyanocobalamin 1,000 mcg Tablet  Commonly known as: VITAMIN B 12   1,000 mcg, Oral, DAILY  Refills: 0     dapagliflozin propanediol 10 mg Tablet  Commonly known as: FARXIGA   10 mg, Oral, DAILY  Qty: 90 Tablet  Refills: 3     DULoxetine 60 mg Capsule,  Delayed Release(E.C.)  Commonly known as: CYMBALTA DR   60 mg, Oral, DAILY  Refills: 0     ergocalciferol (vitamin D2) 1,250 mcg (50,000 unit) Capsule  Commonly known as: DRISDOL   50,000 Units, Oral, EVERY 7 DAYS  Refills: 0     fexofenadine 180 mg Tablet  Commonly known as: ALLEGRA   180 mg, Oral, DAILY  Qty: 30 Tablet  Refills: 11     fluticasone propionate 50 mcg/actuation Spray, Suspension  Commonly known as: FLONASE   1 Spray, Each Nostril, DAILY  Refills: 0     furosemide 20 mg Tablet  Commonly known as: LASIX   20 mg, Oral, DAILY  Qty: 90 Tablet  Refills: 3     HumaLOG U-100 Insulin 100 unit/mL Solution  Generic drug: insulin lispro   100 Units, Subcutaneous, DAILY  Refills: 0     ipratropium bromide 21 mcg (0.03 %) nasal spray  Commonly known as: ATROVENT   2 Sprays, Nasal, 3 TIMES DAILY  Refills:  0     isosorbide mononitrate 30 mg Tablet Sustained Release 24 hr  Commonly known as: IMDUR   TAKE ONE TABLET ( 30 MG ) BY MOUTH EVERY MORNING  Qty: 90 Tablet  Refills: 1     lidocaine 5 % Adhesive Patch, Medicated  Commonly known as: LIDODERM   1 Patch, Transdermal, DAILY  Refills: 0     magnesium oxide,aspartate,citr 400 mg magnesium Capsule   Oral, DAILY  Refills: 0     meclizine 25 mg Tablet  Commonly known as: ANTIVERT   25 mg, Oral, EVERY 12 HOURS PRN  Refills: 0     metFORMIN 500 mg Tablet  Commonly known as: GLUCOPHAGE   500 mg, Oral, 2 TIMES DAILY WITH FOOD  Refills: 0     metoprolol tartrate 100 mg Tablet  Commonly known as: LOPRESSOR   100 mg, Oral, 2 TIMES DAILY  Qty: 180 Tablet  Refills: 3     nitroGLYCERIN 0.4 mg Tablet, Sublingual  Commonly known as: NITROSTAT   DISSOLVE 1 TABLET UNDER TONGUE AT FIRST SIGN OF CHEST PAIN AS NEEDED, MAY REPEAT EVERY 5 MINS. UP TO 3 DOSES, NO MORE THAN 3 TABS IN A 15 MIN. PERIOD  Qty: 25 Tablet  Refills: 1     ondansetron 4 mg Tablet, Rapid Dissolve  Commonly known as: ZOFRAN ODT   No dose, route, or frequency recorded.  Refills: 0     Ozempic 0.25 mg or 0.5 mg (2 mg/3  mL) Pen Injector  Generic drug: semaglutide   No dose, route, or frequency recorded.  Refills: 0     pantoprazole 40 mg Tablet, Delayed Release (E.C.)  Commonly known as: PROTONIX   40 mg, Oral, DAILY  Refills: 0     polyethylene glycol 17 gram Powder in Packet  Commonly known as: MIRALAX   17 g, Oral, DAILY  Refills: 0     pregabalin 50 mg Capsule  Commonly known as: LYRICA   50 mg, Oral, 3 TIMES DAILY  Refills: 0     ranolazine 500 mg Tablet Sustained Release 12 hr  Commonly known as: RANEXA   500 mg, Oral, 2 TIMES DAILY, Appointment needed for additional refills.  Qty: 180 Tablet  Refills: 2     rOPINIRole 0.5 mg Tablet  Commonly known as: REQUIP   0.5 mg, Oral, 3 TIMES DAILY  Refills: 0     Vascepa 1 gram Capsule  Generic drug: icosapent ethyL   2 g, Oral, 2 TIMES DAILY  Refills: 0     ZINC-15 ORAL   Oral, DAILY  Refills: 0            STOP taking these medications.      cefuroxime 500 mg Tablet  Commonly known as: CEFTIN     fluconazole 100 mg Tablet  Commonly known as: DIFLUCAN     HYDROcodone-acetaminophen 5-325 mg Tablet  Commonly known as: NORCO     hydrOXYzine pamoate 50 mg Capsule  Commonly known as: VISTARIL     Trulicity [id number] mL Pen Injector  Generic drug: dulaglutide            Discharge med list refreshed?  YES     Allergies   Allergen Reactions    Gabapentin  Other Adverse Reaction (Add comment)     hallucinations      Morphine  Other Adverse Reaction (Add comment)     Hallucinations      Tylenol Pm [Diphenhydramine-Acetaminophen]      Patient  denied      Keflex [Cephalexin] Nausea/ Vomiting    Victoza [Liraglutide] Nausea/ Vomiting     HOSPITAL PROCEDURE(S):   No orders of the defined types were placed in this encounter.    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 Pt was in the cath lab holding area today to have his ILR removed. Pt tolerated the procedure well and there were no immediate complications. Discharge to home on current meds. F/U with me at [facility] as scheduled.      CONDITION ON DISCHARGE:  A. Ambulation: Full ambulation  B. Self-care Ability: Complete  C. Cognitive Status Alert and Oriented x 3  D. Code status at discharge:       LINES/DRAINS/WOUNDS AT DISCHARGE:   Patient Lines/Drains/Airways Status       Active Line / Dialysis Catheter / Dialysis Graft / Drain / Airway / Wound       None                    DISCHARGE DISPOSITION:  Home discharge         Fortino Sic, MD    Copies sent to Care Team         Relationship Specialty Notifications Start End    Jacqulynn Cadet, MD PCP - General FAMILY PRACTICE  05/29/20     Phone: [phone] Fax: [phone]         [facility] [address] [place] [address]            Referring providers can utilize [email or web address] to access their referred [place] patient's information.

## 2022-07-20 NOTE — H&P (Signed)
[facility]    Date Time: 07/20/2022 10:11  Patient Name: Edwin [name]    I have reviewed the current H&P dated 06/27/22 and have examined the patient on the day of the procedure. When I saw pt in office he reported to be doing well and had no complaints. He called office later stating that he would like to have his ILR removed.     The patient is here for ILR removal. R/B/A discussed, questions answered, informed consent obtained, signed and in the chart.    Vitals: HR:  [89] 89 (04/03 0946)  RR:  [18] 18 (04/03 0946)  BP: (136)/(75) 136/75 (04/03 0946)  SpO2:  [93 %] 93 % (04/03 0946)      Fortino Sic, MD

## 2022-07-20 NOTE — Nurses Notes (Signed)
Pt discharged. Discharge teaching discussed with Pt and Pt verbalized understanding. Pt told to follow up with cardiology as normal but to reach out to  cardiology office if there were signs of infection around the incision site. Pt did not receive sedation so he walked to front of hospital on his own.

## 2022-07-30 ENCOUNTER — Encounter (HOSPITAL_COMMUNITY): Payer: Self-pay | Admitting: Family

## 2022-07-30 ENCOUNTER — Emergency Department
Admission: EM | Admit: 2022-07-30 | Discharge: 2022-07-30 | Disposition: A | Discharge status: Home / Self Care | Payer: Medicare PPO | Attending: Emergency Medicine | Admitting: Emergency Medicine

## 2022-07-30 ENCOUNTER — Emergency Department (HOSPITAL_COMMUNITY): Payer: Medicare PPO

## 2022-07-30 ENCOUNTER — Other Ambulatory Visit: Payer: Self-pay

## 2022-07-30 DIAGNOSIS — I252 Old myocardial infarction: Secondary | ICD-10-CM | POA: Insufficient documentation

## 2022-07-30 DIAGNOSIS — Z955 Presence of coronary angioplasty implant and graft: Secondary | ICD-10-CM | POA: Insufficient documentation

## 2022-07-30 DIAGNOSIS — I440 Atrioventricular block, first degree: Secondary | ICD-10-CM | POA: Insufficient documentation

## 2022-07-30 DIAGNOSIS — E1140 Type 2 diabetes mellitus with diabetic neuropathy, unspecified: Secondary | ICD-10-CM | POA: Insufficient documentation

## 2022-07-30 DIAGNOSIS — I10 Essential (primary) hypertension: Secondary | ICD-10-CM | POA: Insufficient documentation

## 2022-07-30 DIAGNOSIS — I2510 Atherosclerotic heart disease of native coronary artery without angina pectoris: Secondary | ICD-10-CM | POA: Insufficient documentation

## 2022-07-30 DIAGNOSIS — Z8673 Personal history of transient ischemic attack (TIA), and cerebral infarction without residual deficits: Secondary | ICD-10-CM | POA: Insufficient documentation

## 2022-07-30 DIAGNOSIS — R079 Chest pain, unspecified: Secondary | ICD-10-CM

## 2022-07-30 LAB — CBC WITH DIFF
BASOPHIL #: 0.06 10*3/uL (ref 0.00–0.30)
BASOPHIL %: <1 % (ref 0.0–3.0)
EOSINOPHIL #: 0.24 10*3/uL (ref 0.00–0.80)
EOSINOPHIL %: 3.1 % (ref 0.0–7.0)
HCT: 42.7 % (ref 39.0–59.5)
HGB: 14.9 g/dL (ref 13.0–17.0)
LYMPHOCYTE #: 2.11 10*3/uL (ref 0.60–5.10)
LYMPHOCYTE %: 28 % (ref 15.0–46.0)
MCH: 30.1 pg (ref 28.0–35.0)
MCHC: 35 g/dL (ref 33.0–37.0)
MCV: 86 fL (ref 80.0–100.0)
MONOCYTE #: 0.46 10*3/uL (ref 0.10–1.70)
MONOCYTE %: 6.1 % (ref 3.0–15.0)
MPV: 8 fL (ref 8.0–12.0)
NEUTROPHIL #: 4.65 10*3/uL (ref 1.70–8.60)
NEUTROPHIL %: 61.9 % (ref 42.0–78.0)
PLATELETS: 214 10*3/uL (ref 130–440)
RBC: 4.96 10*6/uL (ref 4.00–5.30)
RDW: 12.2 % (ref 12.0–15.0)
WBC: 7.5 10*3/uL (ref 4.0–11.0)

## 2022-07-30 LAB — COMPREHENSIVE METABOLIC PANEL, NON-FASTING
ALBUMIN: 3.6 g/dL (ref 3.4–4.8)
ALKALINE PHOSPHATASE: 80 U/L (ref 45–115)
ALT (SGPT): 40 U/L (ref 10–55)
ANION GAP: 8 mmol/L (ref 4–13)
AST (SGOT): 27 U/L (ref 8–45)
BILIRUBIN TOTAL: 3.8 mg/dL — ABNORMAL HIGH (ref 0.3–1.3)
BUN/CREA RATIO: 10 (ref 6–22)
BUN: 10 mg/dL (ref 8–25)
CALCIUM: 9.8 mg/dL (ref 8.6–10.3)
CHLORIDE: 105 mmol/L (ref 96–111)
CO2 TOTAL: 28 mmol/L (ref 23–31)
CREATININE: 1 mg/dL (ref 0.75–1.35)
ESTIMATED GFR - MALE: 82 mL/min/BSA (ref >=60)
GLUCOSE: 100 mg/dL (ref 65–125)
POTASSIUM: 4.2 mmol/L (ref 3.5–5.1)
PROTEIN TOTAL: 7.2 g/dL (ref 6.0–8.0)
SODIUM: 141 mmol/L (ref 136–145)

## 2022-07-30 LAB — TROPONIN-I
TROPONIN I: 0 ng/L (ref <=30.00)
TROPONIN I: 0 ng/L (ref <=30.00)

## 2022-07-30 LAB — BLUE TOP TUBE

## 2022-07-30 MED ORDER — ASPIRIN 81 MG CHEWABLE TABLET
324.0000 mg | CHEWABLE_TABLET | ORAL | Status: AC
Start: 2022-07-30 — End: 2022-07-30
  Administered 2022-07-30: 324 mg via ORAL

## 2022-07-30 MED ORDER — ACETAMINOPHEN 1,000 MG/100 ML (10 MG/ML) INTRAVENOUS SOLUTION
1000.0000 mg | Freq: Four times a day (QID) | INTRAVENOUS | Status: DC | PRN
Start: 2022-07-30 — End: 2022-07-31

## 2022-07-30 MED ORDER — SODIUM CHLORIDE 0.9 % IV BOLUS
500.0000 mL | INJECTION | Status: AC
Start: 2022-07-30 — End: 2022-07-30
  Administered 2022-07-30: 0 mL via INTRAVENOUS
  Administered 2022-07-30: 500 mL via INTRAVENOUS

## 2022-07-30 NOTE — ED Provider Notes (Signed)
[place] [facility]  ED Primary Provider Note  History of Present Illness   Chief Complaint   Patient presents with    Chest Pain      Edwin [name] is a 67 y.o. male who had concerns including Chest Pain .  Arrival: The patient arrived by Car    Patient presents to the emergency department with complaints of chest pain.  Patient states pain started this morning.  He reports that it radiates into the left side of his neck into his left arm.  Patient also reports a headache.  He denies nausea, vomiting or diarrhea.  Patient has a significant medical history including coronary artery disease, TIA, hypertension, diabetes, and MI, diabetic neuropathy.  Patient reports he has 5 cardiac stents.  Patient states he had a recent appointment with his cardiologist and everything looked good.  Patient reports that his sugars have been running good.  He states he did not take nitroglycerin today because he did not have it with him.  Patient is alert and oriented x4      Chest Pain     History Reviewed This Encounter: Medical History  Surgical History  Family History  Social History    Physical Exam   ED Triage Vitals [07/30/22 1938]   BP (Non-Invasive) 109/71   Heart Rate 96   Respiratory Rate 12   Temperature 36.7 C (98 F)   SpO2 97 %   Weight 90.7 kg (200 lb)   Height 1.727 m (5\' 8" )     Physical Exam  Vitals and nursing note reviewed.   Constitutional:       General: He is not in acute distress.     Appearance: Normal appearance. He is not ill-appearing, toxic-appearing or diaphoretic.   HENT:      Head: Normocephalic and atraumatic.   Eyes:      Pupils: Pupils are equal, round, and reactive to light.   Cardiovascular:      Rate and Rhythm: Normal rate and regular rhythm.      Heart sounds: Normal heart sounds. No murmur heard.  Pulmonary:      Effort: Pulmonary effort is normal. No respiratory distress.      Breath sounds: Normal breath sounds. No wheezing or rhonchi.   Abdominal:      General:  Abdomen is flat. Bowel sounds are normal.      Palpations: Abdomen is soft.      Tenderness: There is no abdominal tenderness.   Skin:     General: Skin is warm and dry.   Neurological:      General: No focal deficit present.      Mental Status: He is alert and oriented to person, place, and time.   Psychiatric:         Mood and Affect: Mood normal.         Behavior: Behavior normal.       Patient Data     Labs Ordered/Reviewed   COMPREHENSIVE METABOLIC PANEL, NON-FASTING - Abnormal; Notable for the following components:       Result Value    BILIRUBIN TOTAL 3.8 (*)     All other components within normal limits   TROPONIN-I - Normal    Narrative:     TROPONIN INTERPRETATIVE COMMENT:    *ERRONEOUS RESULTS MAY BE OBTAINED ON SAMPLES FROM PATIENTS WHO HAVE BEEN TREATED WITH MOUSE MONOCLONAL ANTIBODIES OR WHO HAVE RECIEVED THEM FOR DIAGNOSTIC PURPOSES.      TROPONIN I  VALUES ARE OF THE GREATEST VALUE WHEN SERIALLY PERFORMED.    PRESENCE OF HETEROPHILE ANTIBODIES HAVE BEEN KNOWN TO CAUSE ERRONEOUS RESULTS.    CBC/DIFF    Narrative:     The following orders were created for panel order CBC/DIFF.  Procedure                               Abnormality         Status                     ---------                               -----------         ------                     CBC WITH PPGF[[id number]]                                    Final result                 Please view results for these tests on the individual orders.   CBC WITH DIFF   EXTRA TUBES - CCM ONLY    Narrative:     The following orders were created for panel order EXTRA TUBES - CCM ONLY.  Procedure                               Abnormality         Status                     ---------                               -----------         ------                     BLUE TOP FVWA[[id number]]                                    In process                 GOLD TOP DPTE[[id number]]                                    In process                   Please view results for these  tests on the individual orders.   BLUE TOP TUBE   GOLD TOP TUBE   TROPONIN-I     No orders to display     Medical Decision Making        Medical Decision Making  Patient will remain in the emergency department for a repeat troponin that is supposed to be drawn at 10:30 p.m..    Amount and/or Complexity of Data Reviewed  Labs: ordered. Decision-making details documented in ED Course.  Radiology: ordered. Decision-making details documented in ED Course.  ECG/medicine tests: ordered. Decision-making details documented in ED Course.    Risk  OTC drugs.      ED Course as of 07/30/22 2059   Sat Jul 30, 2022   2027 EKG shows a sinus rhythm with a ventricular rate of 98.  First-degree block with occasional PVCs.  ST elevation or depression noted     2051 X-ray interpreted by me as no acute findings   2051 Case discussed with Dr. Verlon Au.  He feels that patient will be fine to remain in the emergency department for repeat troponin.  Hand off care to Dr. Marguerita Beards       HEART Score: 5          Medications Administered in the ED   aspirin chewable tablet 324 mg (has no administration in time range)   NS bolus infusion 500 mL ( Intravenous Rate Verify 07/30/22 2032)     Clinical Impression   Chest pain, unspecified type (Primary)       Disposition: Discharged

## 2022-07-30 NOTE — Discharge Instructions (Addendum)
Call your cardiologist Monday morning to be seen and evaluated in the office most importantly return to the ER if you develop anymore chest pain.

## 2022-07-30 NOTE — ED Nurses Note (Signed)
Patient discharged home with family.  AVS reviewed with patient/care giver.  A written copy of the AVS and discharge instructions was given to the patient/care giver.  Questions sufficiently answered as needed.  Patient/care giver encouraged to follow up with PCP as indicated.  In the event of an emergency, patient/care giver instructed to call 911 or go to the nearest emergency room.

## 2022-07-30 NOTE — ED APP Handoff Note (Signed)
This case was signed over to me by Zella Ball the mid-level.  For plan was to discharge him home if the 2nd troponin is negative.  The 2nd troponin was negative not only was not negative it was in the lowest of the low range normal.  There was no ischemic changes on his EKG and while the patient is in the ED at this time he is completely asymptomatic he has no pain no shortness of breath he feels fine and he wants to be discharged home.  His vitals are stable he is afebrile he had no concerning lab values and at this time he is medically cleared and ready for discharge.  He says he has 5 cardiac stents and he will probably be optimized medically since that is unlikely they will place a 6 stent in him therefore I told him to return to the ER if he develops any more chest pain.

## 2022-07-30 NOTE — ED Triage Notes (Signed)
Patient with cardiac history. Started with chest pain this morning and has continually worsened. Reports headache, chest pain that radiates into left arm and neck.

## 2022-07-31 LAB — GOLD TOP TUBE

## 2022-08-01 DIAGNOSIS — I4589 Other specified conduction disorders: Secondary | ICD-10-CM

## 2022-08-01 DIAGNOSIS — I491 Atrial premature depolarization: Secondary | ICD-10-CM

## 2022-08-01 DIAGNOSIS — R9431 Abnormal electrocardiogram [ECG] [EKG]: Secondary | ICD-10-CM

## 2022-08-01 DIAGNOSIS — I440 Atrioventricular block, first degree: Secondary | ICD-10-CM

## 2022-08-01 LAB — ECG 12 LEAD
Atrial Rate: 98 {beats}/min
Calculated P Axis: 34 degrees
Calculated R Axis: 52 degrees
Calculated T Axis: 21 degrees
PR Interval: 226 ms
QRS Duration: 72 ms
QT Interval: 336 ms
QTC Calculation: 428 ms
Ventricular rate: 98 {beats}/min

## 2022-08-03 ENCOUNTER — Telehealth (HOSPITAL_COMMUNITY): Payer: Self-pay | Admitting: Cardiovascular Disease

## 2022-08-15 ENCOUNTER — Telehealth (HOSPITAL_COMMUNITY): Payer: Self-pay | Admitting: Cardiovascular Disease

## 2022-08-15 NOTE — Telephone Encounter (Signed)
Patient called for [facility] Cardiac Cath Lab post procedure follow-up:    Where you satisfied with your experience and the care you received in [facility] Cardiac Cath Lab? Yes    Did you receive post procedure instructions? Yes    Do you have any questions about the procedure or the instructions? No    Any pain, numbness, weakness for cyanosis of extremity used for the procedure? No    Do you have a follow up scheduled with your cardiologist? No. Advised patient to call office for follow-up.    Patient denies further questions or needs.

## 2022-08-30 ENCOUNTER — Other Ambulatory Visit (INDEPENDENT_AMBULATORY_CARE_PROVIDER_SITE_OTHER): Payer: Self-pay | Admitting: Cardiovascular Disease

## 2022-08-30 ENCOUNTER — Other Ambulatory Visit (INDEPENDENT_AMBULATORY_CARE_PROVIDER_SITE_OTHER): Payer: Self-pay | Admitting: NURSE PRACTITIONER, FAMILY

## 2022-08-30 DIAGNOSIS — I2510 Atherosclerotic heart disease of native coronary artery without angina pectoris: Secondary | ICD-10-CM

## 2022-08-30 DIAGNOSIS — I679 Cerebrovascular disease, unspecified: Secondary | ICD-10-CM

## 2022-08-30 DIAGNOSIS — I639 Cerebral infarction, unspecified: Secondary | ICD-10-CM

## 2022-08-30 DIAGNOSIS — E119 Type 2 diabetes mellitus without complications: Secondary | ICD-10-CM

## 2022-08-30 DIAGNOSIS — I517 Cardiomegaly: Secondary | ICD-10-CM

## 2022-08-30 DIAGNOSIS — E785 Hyperlipidemia, unspecified: Secondary | ICD-10-CM

## 2022-08-30 NOTE — Telephone Encounter (Signed)
Last office visit- 06/27/2022

## 2022-08-30 NOTE — Addendum Note (Signed)
Addended by: Laverta Baltimore on: 08/30/2022 11:03 AM     Modules accepted: Orders

## 2022-09-15 ENCOUNTER — Emergency Department (HOSPITAL_COMMUNITY): Payer: Medicare PPO

## 2022-09-15 ENCOUNTER — Emergency Department
Admission: EM | Admit: 2022-09-15 | Discharge: 2022-09-15 | Disposition: A | Discharge status: Home / Self Care | Payer: Medicare PPO | Attending: Emergency Medicine | Admitting: Emergency Medicine

## 2022-09-15 ENCOUNTER — Encounter (HOSPITAL_COMMUNITY): Payer: Self-pay

## 2022-09-15 ENCOUNTER — Other Ambulatory Visit: Payer: Self-pay

## 2022-09-15 DIAGNOSIS — M7732 Calcaneal spur, left foot: Secondary | ICD-10-CM | POA: Insufficient documentation

## 2022-09-15 DIAGNOSIS — M25572 Pain in left ankle and joints of left foot: Secondary | ICD-10-CM | POA: Insufficient documentation

## 2022-09-15 MED ORDER — PREDNISONE 5 MG TABLET
60.0000 mg | ORAL_TABLET | ORAL | Status: DC
Start: 2022-09-15 — End: 2022-09-15

## 2022-09-15 MED ORDER — INDOMETHACIN 25 MG CAPSULE
50.0000 mg | ORAL_CAPSULE | Freq: Three times a day (TID) | ORAL | 0 refills | Status: AC
Start: 2022-09-15 — End: 2022-09-17

## 2022-09-15 MED ORDER — INDOMETHACIN 25 MG CAPSULE
50.0000 mg | ORAL_CAPSULE | ORAL | Status: AC
Start: 2022-09-15 — End: 2022-09-15
  Administered 2022-09-15: 50 mg via ORAL
  Filled 2022-09-15: qty 2

## 2022-09-15 NOTE — ED Nurses Note (Signed)
Patient discharged home with family.  AVS reviewed with patient/care giver.  A written copy of the AVS and discharge instructions was given to the patient/care giver.  Questions sufficiently answered as needed.  Patient/care giver encouraged to follow up with PCP as indicated.  In the event of an emergency, patient/care giver instructed to call 911 or go to the nearest emergency room.

## 2022-09-15 NOTE — Discharge Instructions (Signed)
This may be gout, so I recommend taking the indomethacin 2 tablets, 3 times daily for the next 2 days, then as needed 1 tablet 3 times daily.  Follow up closely with Dr. Luciano Cutter, who will likely do more testing.  Follow up closely with your primary doctor as well.  Return to the ER for any worsening symptoms.

## 2022-09-15 NOTE — ED Provider Notes (Signed)
[place] [facility]  ED Primary Provider Note  History of Present Illness   Chief Complaint   Patient presents with    Foot Pain     Edwin [name] is a 67 y.o. male who had concerns including Foot Pain.  Arrival: The patient arrived by Car    This is a 67 year old man with a history of diabetes, diabetic neuropathy, coronary artery disease, hypertension.  He is complaining of left ankle pain for the last 4 hours, sharp moderate, right under the left lateral malleolus, nonradiating, worse with movement, with no other modifying factors.  He denies other symptoms, including trauma, fever, change of the neuropathy, other pain, or other symptoms.        History Reviewed This Encounter: Medical History  Surgical History  Family History  Social History    Physical Exam   ED Triage Vitals [09/15/22 0425]   BP (Non-Invasive) 127/80   Heart Rate 99   Respiratory Rate 18   Temperature 36.6 C (97.8 F)   SpO2 96 %   Weight    Height      Physical Exam  Vitals and nursing note reviewed.   Constitutional:       General: He is not in acute distress.     Appearance: He is well-developed.   HENT:      Head: Normocephalic and atraumatic.      Nose: Nose normal.      Mouth/Throat:      Mouth: Mucous membranes are moist.   Eyes:      Conjunctiva/sclera: Conjunctivae normal.   Cardiovascular:      Rate and Rhythm: Normal rate and regular rhythm.      Heart sounds: No murmur heard.  Pulmonary:      Effort: Pulmonary effort is normal. No respiratory distress.      Breath sounds: Normal breath sounds.   Abdominal:      Palpations: Abdomen is soft.      Tenderness: There is no abdominal tenderness.   Musculoskeletal:         General: No swelling.      Cervical back: Neck supple.      Comments: Left foot and ankle are nontender.  Pulses 2+, sensation at baseline, Achilles tendon intact some pain with range of motion of the left ankle   Skin:     General: Skin is warm and dry.      Capillary Refill: Capillary refill  takes less than 2 seconds.   Neurological:      Mental Status: He is alert.      Comments: Sensation is at baseline per the patient   Psychiatric:         Mood and Affect: Mood normal.       Patient Data   Labs Ordered/Reviewed - No data to display  No orders to display     Medical Decision Making        Medical Decision Making  Differential diagnosis includes fracture, strain, contusion, arthritis, gouty arthritis    Amount and/or Complexity of Data Reviewed  Radiology: ordered.    Risk  Prescription drug management.      ED Course as of 09/15/22 0456   Thu Sep 15, 2022   1610 No visible fracture or dislocation, but the distal fibula does appear more complex than the previous x-ray in 2023                   Medications Administered in the  ED   indomethacin (INDOCIN) capsule (50 mg Oral Given 09/15/22 0435)     Clinical Impression   Acute left ankle pain (Primary)       Disposition: Discharged

## 2022-09-15 NOTE — ED Triage Notes (Signed)
Stabbing L foot pain started about 2 hrs ago

## 2022-10-19 ENCOUNTER — Encounter (HOSPITAL_COMMUNITY): Payer: Self-pay

## 2022-10-19 ENCOUNTER — Emergency Department (HOSPITAL_COMMUNITY): Payer: Medicare PPO

## 2022-10-19 ENCOUNTER — Other Ambulatory Visit: Payer: Self-pay

## 2022-10-19 ENCOUNTER — Emergency Department
Admission: EM | Admit: 2022-10-19 | Discharge: 2022-10-19 | Disposition: A | Discharge status: Home / Self Care | Payer: Medicare PPO | Attending: Emergency Medicine | Admitting: Emergency Medicine

## 2022-10-19 DIAGNOSIS — S20219A Contusion of unspecified front wall of thorax, initial encounter: Secondary | ICD-10-CM | POA: Insufficient documentation

## 2022-10-19 DIAGNOSIS — K222 Esophageal obstruction: Secondary | ICD-10-CM | POA: Insufficient documentation

## 2022-10-19 DIAGNOSIS — W19XXXA Unspecified fall, initial encounter: Secondary | ICD-10-CM | POA: Insufficient documentation

## 2022-10-19 DIAGNOSIS — I440 Atrioventricular block, first degree: Secondary | ICD-10-CM | POA: Insufficient documentation

## 2022-10-19 LAB — CBC WITH DIFF
BASOPHIL #: 0.06 10*3/uL (ref 0.00–0.30)
BASOPHIL %: <1 % (ref 0.0–3.0)
EOSINOPHIL #: 0.22 10*3/uL (ref 0.00–0.80)
EOSINOPHIL %: 3 % (ref 0.0–7.0)
HCT: 48.6 % (ref 39.0–59.5)
HGB: 16.4 g/dL (ref 13.0–17.0)
LYMPHOCYTE #: 2.72 10*3/uL (ref 0.60–5.10)
LYMPHOCYTE %: 36.7 % (ref 15.0–46.0)
MCH: 30 pg (ref 28.0–35.0)
MCHC: 33.8 g/dL (ref 33.0–37.0)
MCV: 88.7 fL (ref 80.0–100.0)
MONOCYTE #: 0.49 10*3/uL (ref 0.10–1.70)
MONOCYTE %: 6.6 % (ref 3.0–15.0)
MPV: 7.1 fL — ABNORMAL LOW (ref 8.0–12.0)
NEUTROPHIL #: 3.93 10*3/uL (ref 1.70–8.60)
NEUTROPHIL %: 53 % (ref 42.0–78.0)
PLATELETS: 196 10*3/uL (ref 130–440)
RBC: 5.48 10*6/uL — ABNORMAL HIGH (ref 4.00–5.30)
RDW: 11 % — ABNORMAL LOW (ref 12.0–15.0)
WBC: 7.4 10*3/uL (ref 4.0–11.0)

## 2022-10-19 LAB — COMPREHENSIVE METABOLIC PANEL, NON-FASTING
ALBUMIN: 3.8 g/dL (ref 3.4–4.8)
ALKALINE PHOSPHATASE: 66 U/L (ref 45–115)
ALT (SGPT): 20 U/L (ref 10–55)
ANION GAP: 11 mmol/L (ref 4–13)
AST (SGOT): 18 U/L (ref 8–45)
BILIRUBIN TOTAL: 3.6 mg/dL — ABNORMAL HIGH (ref 0.3–1.3)
BUN/CREA RATIO: 13 (ref 6–22)
BUN: 14 mg/dL (ref 8–25)
CALCIUM: 8.8 mg/dL (ref 8.6–10.3)
CHLORIDE: 101 mmol/L (ref 96–111)
CO2 TOTAL: 29 mmol/L (ref 23–31)
CREATININE: 1.1 mg/dL (ref 0.75–1.35)
ESTIMATED GFR - MALE: 74 mL/min/BSA (ref >=60)
GLUCOSE: 134 mg/dL — ABNORMAL HIGH (ref 65–125)
POTASSIUM: 3.7 mmol/L (ref 3.5–5.1)
PROTEIN TOTAL: 7.1 g/dL (ref 6.0–8.0)
SODIUM: 141 mmol/L (ref 136–145)

## 2022-10-19 LAB — GOLD TOP TUBE

## 2022-10-19 LAB — PT/INR
INR: 1.07 (ref 0.70–1.40)
PROTHROMBIN TIME: 14.1 seconds (ref 12.1–14.2)

## 2022-10-19 LAB — MAGNESIUM: MAGNESIUM: 2.1 mg/dL (ref 1.8–2.6)

## 2022-10-19 LAB — LAVENDER TOP TUBE

## 2022-10-19 LAB — PTT (PARTIAL THROMBOPLASTIN TIME): APTT: <24 seconds — ABNORMAL LOW (ref 24.0–36.0)

## 2022-10-19 LAB — TROPONIN-I
TROPONIN-I HS: 3.4 ng/L (ref <19.8)
TROPONIN-I HS: 2.8 ng/L (ref <19.8)

## 2022-10-19 MED ORDER — SODIUM CHLORIDE 0.9 % (FLUSH) INJECTION SYRINGE
1.0000 mL | INJECTION | INTRAMUSCULAR | Status: DC | PRN
Start: 2022-10-19 — End: 2022-10-19

## 2022-10-19 MED ORDER — IOPAMIDOL 370 MG IODINE/ML (76 %) INTRAVENOUS SOLUTION
50.0000 mL | INTRAVENOUS | Status: AC
Start: 2022-10-19 — End: 2022-10-19
  Administered 2022-10-19: 50 mL via INTRAVENOUS
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
## 2022-10-19 NOTE — Discharge Instructions (Addendum)
Take aspirin as needed for pain every 4-6 hours  Follow-up with your gastroenterology appointment as scheduled

## 2022-10-19 NOTE — ED Nurses Note (Signed)
Patient discharged home with family.  AVS reviewed with patient/care giver.  A written copy of the AVS and discharge instructions was given to the patient/care giver.  Questions sufficiently answered as needed.  Patient/care giver encouraged to follow up with PCP as indicated.  In the event of an emergency, patient/care giver instructed to call 911 or go to the nearest emergency room.

## 2022-10-19 NOTE — ED Triage Notes (Signed)
EMS states : Chest pain that started 5 days ago. Patient states pain is under left breast and worse on palpitation. Patient is sinus on the monitor. Patient has a 20 gauge in the Left FA. Patient was given 4 baby Aspirin in route along with approx. 300 ml NS bolus. Vitals stable. Patient has prior history of MI. "

## 2022-10-21 DIAGNOSIS — I440 Atrioventricular block, first degree: Secondary | ICD-10-CM

## 2022-10-21 LAB — ECG 12 LEAD
Atrial Rate: 85 {beats}/min
Calculated P Axis: 15 degrees
Calculated R Axis: 29 degrees
Calculated T Axis: 35 degrees
PR Interval: 230 ms
QRS Duration: 82 ms
QT Interval: 386 ms
QTC Calculation: 459 ms
Ventricular rate: 85 {beats}/min

## 2022-11-08 ENCOUNTER — Other Ambulatory Visit (INDEPENDENT_AMBULATORY_CARE_PROVIDER_SITE_OTHER): Payer: Self-pay | Admitting: Cardiovascular Disease

## 2022-11-09 NOTE — Telephone Encounter (Signed)
Last office visit- 06/27/2022

## 2022-12-03 ENCOUNTER — Emergency Department (HOSPITAL_COMMUNITY): Payer: Medicare PPO

## 2022-12-03 ENCOUNTER — Emergency Department
Admission: EM | Admit: 2022-12-03 | Discharge: 2022-12-03 | Disposition: A | Discharge status: Home / Self Care | Payer: Medicare PPO | Attending: EMERGENCY MEDICINE | Admitting: EMERGENCY MEDICINE

## 2022-12-03 ENCOUNTER — Encounter (HOSPITAL_COMMUNITY): Payer: Self-pay

## 2022-12-03 ENCOUNTER — Other Ambulatory Visit: Payer: Self-pay

## 2022-12-03 DIAGNOSIS — R42 Dizziness and giddiness: Secondary | ICD-10-CM

## 2022-12-03 DIAGNOSIS — H8110 Benign paroxysmal vertigo, unspecified ear: Secondary | ICD-10-CM | POA: Insufficient documentation

## 2022-12-03 MED ORDER — MECLIZINE 25 MG TABLET
12.5000 mg | ORAL_TABLET | Freq: Three times a day (TID) | ORAL | Status: DC | PRN
Start: 2022-12-03 — End: 2022-12-03

## 2022-12-03 MED ORDER — MECLIZINE 25 MG TABLET
12.5000 mg | ORAL_TABLET | ORAL | Status: AC
Start: 2022-12-03 — End: 2022-12-03
  Administered 2022-12-03: 12.5 mg via ORAL
  Filled 2022-12-03: qty 1

## 2022-12-03 NOTE — ED Triage Notes (Signed)
EMS called to [facility] for patient complaining that he got light headed and started having a pain on left side of head with complaints of some spotty vision. Administered 4 mg Zofran IM for c/o nausea. VSS stable enroute, A & O x3.

## 2022-12-03 NOTE — ED Provider Notes (Signed)
[facility]  ED Primary Provider Note  History of Present Illness   Chief Complaint   Patient presents with    Dizziness     Arrival: The patient arrived by Ambulance  Edwin [name] is a 67 y.o. male who had concerns including Dizziness.  67 year old male with new onset dizziness as he had that he describes as room spinning.  [place] Spaniel he has happened a few hours ago.  He has not been sick in any way.  No viral prodrome.  No fever cough vomiting sore throat diarrhea.  He has been nauseous since this happened.  He has feels like he is unsteady on his feet.    Review of Systems   Constitutional: No fever, chills or weakness   Skin: No rash or diaphoresis  HENT: No headaches, or congestion  Eyes: No vision changes or photophobia   Cardio: No chest pain, palpitations or leg swelling   Respiratory: No cough, wheezing or SOB  GI:  No nausea, vomiting or stool changes  GU:  No dysuria, hematuria, or increased frequency  MSK: No muscle aches, joint or back pain  Neuro: No seizures, LOC, numbness, tingling, or focal weakness  Psychiatric: No depression, SI or substance abuse  All other systems reviewed and are negative.    Historical Data   History Reviewed This Encounter: Medical History  Surgical History  Family History  Social History    Physical Exam   ED Triage Vitals [12/03/22 2116]   BP (Non-Invasive) 118/68   Heart Rate 90   Respiratory Rate 16   Temperature 37.2 C (99 F)   SpO2 94 %   Weight 91.2 kg (201 lb)   Height 1.727 m (5\' 8" )       Constitutional:  67 y.o. male who appears in no distress. Normal color, no cyanosis.   HENT:   Head: Normocephalic and atraumatic.   Mouth/Throat: Oropharynx is clear and moist.   Eyes: EOMI, PERRL   Neck: Trachea midline. Neck supple.  Cardiovascular: RRR, No murmurs, rubs or gallops. Intact distal pulses.  Pulmonary/Chest: BS equal bilaterally. No respiratory distress. No wheezes, rales or chest tenderness.   Abdominal: Bowel sounds present and  normal. Abdomen soft, no tenderness, no rebound and no guarding.  Back: No midline spinal tenderness, no paraspinal tenderness, no CVA tenderness.           Musculoskeletal: No edema, tenderness or deformity.  Skin: warm and dry. No rash, erythema, pallor or cyanosis  Psychiatric: normal mood and affect. Behavior is normal.   Neurological: Patient keenly alert and responsive, easily able to raise eyebrows, facial muscles/expressions symmetric, speaking in fluent sentences, moving all extremities equally and fully, normal gait  Patient Data   Labs Ordered/Reviewed - No data to display  No orders to display     Medical Decision Making     Head CT is within normal limits read by the radiologist.  Patient feels better with the meclizine given here.  Patient is okay to go home.  No vomiting.  If     This does not appear to be a stroke or any other serious lesion.  This is consistent with benign positional vertigo.    Medical Decision Making  Amount and/or Complexity of Data Reviewed  Radiology: ordered.                       Medications Administered in the ED   meclizine (ANTIVERT) tablet (has no administration  in time range)   meclizine (ANTIVERT) tablet (has no administration in time range)     Clinical Impression   Vertigo (Primary)       Disposition: Discharged

## 2022-12-03 NOTE — ED Nurses Note (Signed)

## 2022-12-20 ENCOUNTER — Other Ambulatory Visit (HOSPITAL_COMMUNITY): Payer: Self-pay | Admitting: Family Medicine

## 2022-12-20 DIAGNOSIS — R42 Dizziness and giddiness: Secondary | ICD-10-CM

## 2022-12-30 ENCOUNTER — Ambulatory Visit (HOSPITAL_COMMUNITY)
Admission: RE | Admit: 2022-12-30 | Discharge: 2022-12-30 | Disposition: A | Discharge status: Still In Hospital | Payer: Medicare PPO | Source: Ambulatory Visit | Attending: Family Medicine | Admitting: Family Medicine

## 2022-12-30 ENCOUNTER — Encounter (HOSPITAL_COMMUNITY): Payer: Self-pay

## 2022-12-30 ENCOUNTER — Other Ambulatory Visit: Payer: Self-pay

## 2022-12-30 DIAGNOSIS — R42 Dizziness and giddiness: Secondary | ICD-10-CM | POA: Insufficient documentation

## 2023-01-04 ENCOUNTER — Encounter (HOSPITAL_COMMUNITY): Payer: Self-pay

## 2023-01-06 ENCOUNTER — Encounter (HOSPITAL_COMMUNITY): Payer: Self-pay

## 2023-01-11 ENCOUNTER — Other Ambulatory Visit: Payer: Self-pay

## 2023-01-11 ENCOUNTER — Ambulatory Visit
Admission: RE | Admit: 2023-01-11 | Discharge: 2023-01-11 | Disposition: A | Discharge status: Still In Hospital | Payer: Medicare PPO | Source: Ambulatory Visit | Attending: Family Medicine | Admitting: Family Medicine

## 2023-01-11 DIAGNOSIS — R42 Dizziness and giddiness: Secondary | ICD-10-CM

## 2023-01-18 ENCOUNTER — Encounter (HOSPITAL_COMMUNITY): Payer: Self-pay

## 2023-02-27 ENCOUNTER — Other Ambulatory Visit (INDEPENDENT_AMBULATORY_CARE_PROVIDER_SITE_OTHER): Payer: Self-pay | Admitting: Cardiovascular Disease

## 2023-02-27 DIAGNOSIS — I2510 Atherosclerotic heart disease of native coronary artery without angina pectoris: Secondary | ICD-10-CM

## 2023-02-27 NOTE — Telephone Encounter (Signed)
Last office visit- 06/27/2022- Messaged front desk to schedule

## 2023-03-23 ENCOUNTER — Emergency Department (HOSPITAL_COMMUNITY): Payer: Medicare PPO

## 2023-03-23 ENCOUNTER — Emergency Department
Admission: EM | Admit: 2023-03-23 | Discharge: 2023-03-24 | Disposition: A | Discharge status: Home / Self Care | Payer: Medicare PPO | Attending: EMERGENCY MEDICINE | Admitting: EMERGENCY MEDICINE

## 2023-03-23 ENCOUNTER — Other Ambulatory Visit: Payer: Self-pay

## 2023-03-23 ENCOUNTER — Encounter (HOSPITAL_COMMUNITY): Payer: Self-pay

## 2023-03-23 DIAGNOSIS — R079 Chest pain, unspecified: Secondary | ICD-10-CM

## 2023-03-23 DIAGNOSIS — R0789 Other chest pain: Secondary | ICD-10-CM | POA: Insufficient documentation

## 2023-03-23 LAB — CBC WITH DIFF
BASOPHIL #: 0.04 10*3/uL (ref <=0.20)
BASOPHIL %: 0.6 %
EOSINOPHIL #: 0.22 10*3/uL (ref <=0.50)
EOSINOPHIL %: 3.2 %
HCT: 44.4 % (ref 38.9–52.0)
HGB: 15.6 g/dL (ref 13.4–17.5)
IMMATURE GRANULOCYTE #: <0.04 10*3/uL (ref <0.10)
IMMATURE GRANULOCYTE %: 0.1 % (ref 0.0–1.0)
LYMPHOCYTE #: 2.33 10*3/uL (ref 1.00–4.80)
LYMPHOCYTE %: 33.6 %
MCH: 28.8 pg (ref 26.0–32.0)
MCHC: 35.1 g/dL (ref 31.0–35.5)
MCV: 82.1 fL (ref 78.0–100.0)
MONOCYTE #: 0.53 10*3/uL (ref 0.20–1.10)
MONOCYTE %: 7.6 %
MPV: 9.7 fL (ref 8.7–12.5)
NEUTROPHIL #: 3.81 10*3/uL (ref 1.50–7.70)
NEUTROPHIL %: 54.9 %
PLATELETS: 223 10*3/uL (ref 150–400)
RBC: 5.41 10*6/uL (ref 4.50–6.10)
RDW-CV: 12.6 % (ref 11.5–15.5)
WBC: 6.9 10*3/uL (ref 3.7–11.0)

## 2023-03-23 LAB — COMPREHENSIVE METABOLIC PANEL, NON-FASTING
ALBUMIN: 3.9 g/dL (ref 3.4–4.8)
ALKALINE PHOSPHATASE: 81 U/L (ref 45–115)
ALT (SGPT): 24 U/L (ref <43)
ANION GAP: 11 mmol/L (ref 4–13)
AST (SGOT): 26 U/L (ref 11–34)
BILIRUBIN TOTAL: 3.6 mg/dL — ABNORMAL HIGH (ref 0.3–1.3)
BUN/CREA RATIO: 10 (ref 6–22)
BUN: 14 mg/dL (ref 8–25)
CALCIUM: 9.4 mg/dL (ref 8.6–10.3)
CHLORIDE: 103 mmol/L (ref 96–111)
CO2 TOTAL: 28 mmol/L (ref 23–31)
CREATININE: 1.4 mg/dL — ABNORMAL HIGH (ref 0.75–1.35)
ESTIMATED GFR - MALE: 55 mL/min/BSA — ABNORMAL LOW (ref >=60)
GLUCOSE: 161 mg/dL — ABNORMAL HIGH (ref 65–125)
POTASSIUM: 4.1 mmol/L (ref 3.5–5.1)
PROTEIN TOTAL: 7.3 g/dL (ref 6.0–8.0)
SODIUM: 142 mmol/L (ref 136–145)

## 2023-03-23 LAB — PT/INR
INR: 1.04 (ref 0.70–1.40)
PROTHROMBIN TIME: 13.8 s (ref 12.1–14.2)

## 2023-03-23 LAB — PTT (PARTIAL THROMBOPLASTIN TIME): APTT: 24.7 s (ref 24.0–36.0)

## 2023-03-23 LAB — TROPONIN-I: TROPONIN-I HS: 5.2 ng/L (ref <19.8)

## 2023-03-23 MED ORDER — LACTATED RINGERS IV BOLUS
500.0000 mL | INJECTION | Status: AC
Start: 2023-03-24 — End: 2023-03-24
  Administered 2023-03-24: 500 mL via INTRAVENOUS
  Administered 2023-03-24: 0 mL via INTRAVENOUS

## 2023-03-23 MED ORDER — FENTANYL (PF) 50 MCG/ML INJECTION WRAPPER
50.0000 ug | INJECTION | INTRAMUSCULAR | Status: AC
Start: 2023-03-23 — End: 2023-03-23
  Administered 2023-03-23: 50 ug via INTRAVENOUS
  Filled 2023-03-23: qty 2

## 2023-03-23 MED ORDER — ASPIRIN 81 MG CHEWABLE TABLET
324.0000 mg | CHEWABLE_TABLET | ORAL | Status: AC
Start: 2023-03-23 — End: 2023-03-23
  Administered 2023-03-23: 324 mg via ORAL
  Filled 2023-03-23: qty 4

## 2023-03-23 NOTE — ED Triage Notes (Addendum)
Pt arrived ambulatory with c/o chest pain that began about 1 hour PTA. Pt did take 1 nitro at home with relief from 8/10 to 6/10. Pt does have history of MI and stent. Pt c/o SOB and dizziness as well.  Pt states he also fell on Monday after getting dizzy getting up out of his chair. States he fell forward, reaching for a chair and twisted then fell to the floor on his right side.

## 2023-03-23 NOTE — ED Provider Notes (Signed)
[facility] - Emergency Department  ED Primary Provider Note  History of Present Illness   Chief Complaint   Patient presents with    Chest Pain      Edwin [name] is a 67 y.o. male who had concerns including Chest Pain .  Arrival: The patient arrived by Car    HPI  67 year old male who presents with a an episode of achy chest pain.  Patient denies any associated nausea or vomiting or diaphoresis patient states he has had a heart attack in the past and was concerned about his episode.  States his pain was different than his cardiac chest pain.  But states he took a nitroglycerin in his not sure if it helps his pain or not.        History Reviewed This Encounter: Medical History  Surgical History  Family History  Social History    Physical Exam   ED Triage Vitals [03/23/23 2153]   BP (Non-Invasive) (!) 134/56   Heart Rate 91   Respiratory Rate 18   Temperature 36.2 C (97.2 F)   SpO2 100 %   Weight 93.4 kg (206 lb)   Height 1.727 m (5\' 8" )     Physical Exam  Vitals and nursing note reviewed.   Constitutional:       Appearance: Normal appearance.   HENT:      Head: Normocephalic.      Mouth/Throat:      Mouth: Mucous membranes are moist.   Cardiovascular:      Rate and Rhythm: Normal rate.   Pulmonary:      Effort: Pulmonary effort is normal.      Breath sounds: Normal breath sounds.   Skin:     General: Skin is warm.   Neurological:      General: No focal deficit present.      Mental Status: He is alert and oriented to person, place, and time.       Patient Data     Labs Ordered/Reviewed   COMPREHENSIVE METABOLIC PANEL, NON-FASTING - Abnormal; Notable for the following components:       Result Value    CREATININE 1.40 (*)     GLUCOSE 161 (*)     BILIRUBIN TOTAL 3.6 (*)     ESTIMATED GFR - MALE 55 (*)     All other components within normal limits   TROPONIN-I - Normal    Narrative:     TROPONIN INTERPRETATIVE COMMENT:    *ERRONEOUS RESULTS MAY BE OBTAINED ON SAMPLES FROM PATIENTS WHO HAVE BEEN  TREATED WITH MOUSE MONOCLONAL ANTIBODIES OR WHO HAVE RECIEVED THEM FOR DIAGNOSTIC PURPOSES.      TROPONIN I VALUES ARE OF THE GREATEST VALUE WHEN SERIALLY PERFORMED.    PRESENCE OF HETEROPHILE ANTIBODIES HAVE BEEN KNOWN TO CAUSE ERRONEOUS RESULTS.    PT/INR - Normal    Narrative:     In the setting of warfarin therapy, a moderate-intensity INR goal range is 2.0 to 3.0 and a high-intensity INR goal range is 2.5 to 3.5.    INR is ONLY validated to determine the level of anticoagulation with vitamin K antagonists (warfarin). Other factors may elevate the INR including but not limited to direct oral anticoagulants (DOACs), liver dysfunction, vitamin K deficiency, DIC, factor deficiencies, and factor inhibitors.   PTT (PARTIAL THROMBOPLASTIN TIME) - Normal   TROPONIN-I - Normal    Narrative:     TROPONIN INTERPRETATIVE COMMENT:    *ERRONEOUS RESULTS MAY BE OBTAINED ON  SAMPLES FROM PATIENTS WHO HAVE BEEN TREATED WITH MOUSE MONOCLONAL ANTIBODIES OR WHO HAVE RECIEVED THEM FOR DIAGNOSTIC PURPOSES.      TROPONIN I VALUES ARE OF THE GREATEST VALUE WHEN SERIALLY PERFORMED.    PRESENCE OF HETEROPHILE ANTIBODIES HAVE BEEN KNOWN TO CAUSE ERRONEOUS RESULTS.    CBC/DIFF    Narrative:     The following orders were created for panel order CBC/DIFF.  Procedure                               Abnormality         Status                     ---------                               -----------         ------                     CBC WITH EXBM[[id number]]                                    Final result                 Please view results for these tests on the individual orders.   CBC WITH DIFF   EXTRA TUBES - CCM ONLY    Narrative:     The following orders were created for panel order EXTRA TUBES - CCM ONLY.  Procedure                               Abnormality         Status                     ---------                               -----------         ------                     GOLD TOP UUVO[[id number]]                                    In  process                 LAVENDER TOP TUBE[[id number]]                                In process                   Please view results for these tests on the individual orders.   GOLD TOP TUBE   LAVENDER TOP TUBE     No orders to display     Medical Decision Making        Medical Decision Making  Amount and/or Complexity of Data Reviewed  Labs: ordered.  Radiology: ordered.  ECG/medicine tests: ordered.    Risk  OTC drugs.  Prescription drug management.  Parenteral controlled substances.      ED Course as of 03/24/23 0116   Thu Mar 23, 2023   2303 Checks x-ray no acute disease process   Fri Mar 24, 2023   0114 Labs: Are overall unremarkable and reassuring with normal cardiac markers and normal EKGs.  Patient is reassured patient was discharged home at this time strict return precautions were given       HEART Score: 3             Medications Administered in the ED   aspirin chewable tablet 324 mg (324 mg Oral Given 03/23/23 2302)   fentaNYL (SUBLIMAZE) 50 mcg/mL injection (50 mcg Intravenous Given 03/23/23 2301)   LR bolus infusion 500 mL (0 mL Intravenous Stopped 03/24/23 0054)   rOPINIRole (REQUIP) tablet (0.5 mg Oral Given 03/24/23 0024)     Clinical Impression   Chest pain, unspecified type (Primary)       Disposition: Discharged

## 2023-03-24 LAB — TROPONIN-I: TROPONIN-I HS: 5.1 ng/L (ref <19.8)

## 2023-03-24 LAB — GOLD TOP TUBE

## 2023-03-24 LAB — LAVENDER TOP TUBE

## 2023-03-24 MED ORDER — ROPINIROLE 0.5 MG TABLET
0.5000 mg | ORAL_TABLET | Freq: Every day | ORAL | Status: DC | PRN
Start: 2023-03-24 — End: 2023-03-24

## 2023-03-24 MED ORDER — ROPINIROLE 0.25 MG TABLET
0.5000 mg | ORAL_TABLET | ORAL | Status: AC
Start: 2023-03-24 — End: 2023-03-24
  Administered 2023-03-24: 0.5 mg via ORAL
  Filled 2023-03-24: qty 2

## 2023-03-24 NOTE — ED Nurses Note (Signed)
Patient discharged home.  AVS reviewed with patient.  A written copy of the AVS and discharge instructions was given to the patient.  Questions sufficiently answered as needed.  Patient encouraged to follow up with PCP as indicated.  In the event of an emergency, patient instructed to call 911 or go to the nearest emergency room.

## 2023-03-29 DIAGNOSIS — I440 Atrioventricular block, first degree: Secondary | ICD-10-CM

## 2023-03-29 DIAGNOSIS — R079 Chest pain, unspecified: Secondary | ICD-10-CM

## 2023-03-29 DIAGNOSIS — R9431 Abnormal electrocardiogram [ECG] [EKG]: Secondary | ICD-10-CM

## 2023-03-29 LAB — ECG 12 LEAD
Atrial Rate: 89 {beats}/min
Calculated P Axis: 48 degrees
Calculated R Axis: 32 degrees
Calculated R Axis: 32 degrees
Calculated T Axis: 55 degrees
Calculated T Axis: 60 degrees
PR Interval: 222 ms
QRS Duration: 76 ms
QRS Duration: 74 ms
QT Interval: 348 ms
QT Interval: 348 ms
QTC Calculation: 423 ms
QTC Calculation: 428 ms
Ventricular rate: 89 {beats}/min
Ventricular rate: 91 {beats}/min

## 2023-05-19 ENCOUNTER — Emergency Department (HOSPITAL_COMMUNITY): Payer: Medicare PPO

## 2023-05-19 ENCOUNTER — Other Ambulatory Visit: Payer: Self-pay

## 2023-05-19 ENCOUNTER — Emergency Department
Admission: EM | Admit: 2023-05-19 | Discharge: 2023-05-19 | Disposition: A | Discharge status: Home / Self Care | Payer: Medicare PPO | Attending: Family | Admitting: Family

## 2023-05-19 ENCOUNTER — Encounter (HOSPITAL_COMMUNITY): Payer: Self-pay

## 2023-05-19 DIAGNOSIS — M542 Cervicalgia: Secondary | ICD-10-CM | POA: Insufficient documentation

## 2023-05-19 NOTE — ED Triage Notes (Signed)
EMS was called to [facility] for neck pain. Patient states he was picking up a car jack, felt a pop in his neck and now has pain going in between his shoulder."

## 2023-05-19 NOTE — ED Nurses Note (Signed)

## 2023-05-19 NOTE — ED Provider Notes (Signed)
[facility] - Emergency Department  ED Primary Provider Note  History of Present Illness   Chief Complaint   Patient presents with    Neck Pain     Edwin [name] is a 68 y.o. male who had concerns including Neck Pain.  Arrival: The patient arrived by Ambulance    Patient presents to the emergency department via EMS for neck pain.  Patient reports he was shopping at [employer] and bent down to pick up a car [place].  Patient reports it was not very heavy.  He states he put it in the cart and then as he went to walk away it is sudden sharp stabbing pain in the back of his neck and felt a pop.  Patient denies numbness or tingling to the extremities that is any worse since he has not neuropathy.  Patient denies changes in bowel or bladder habits.  The patient states the pain radiates to the bottom of his neck and in between his shoulder blades.  Patient has a history of CAD, DM, TIA, neuropathy.  Patient is alert and oriented x4.  He denies other symptoms at this time      Neck Pain    History Reviewed This Encounter: Medical History  Surgical History  Family History  Social History    Physical Exam   ED Triage Vitals   BP (Non-Invasive) 05/19/23 1522 97/66   Heart Rate 05/19/23 1519 96   Respiratory Rate 05/19/23 1519 18   Temperature 05/19/23 1519 36.9 C (98.4 F)   SpO2 05/19/23 1519 98 %   Weight 05/19/23 1519 93.4 kg (206 lb)   Height 05/19/23 1519 1.727 m (5\' 8" )     Physical Exam  Vitals and nursing note reviewed.   Constitutional:       General: He is not in acute distress.     Appearance: Normal appearance. He is not ill-appearing, toxic-appearing or diaphoretic.   HENT:      Head: Normocephalic and atraumatic.   Eyes:      Pupils: Pupils are equal, round, and reactive to light.   Neck:      Comments: Mild tenderness over C5-C6 to palpation.  Cardiovascular:      Rate and Rhythm: Normal rate and regular rhythm.      Heart sounds: Normal heart sounds.   Pulmonary:      Effort: Pulmonary  effort is normal. No respiratory distress.      Breath sounds: Normal breath sounds.   Abdominal:      General: Bowel sounds are normal.      Palpations: Abdomen is soft.      Tenderness: There is no abdominal tenderness.   Musculoskeletal:         General: Normal range of motion.   Skin:     General: Skin is warm and dry.   Neurological:      General: No focal deficit present.      Mental Status: He is alert and oriented to person, place, and time.   Psychiatric:         Mood and Affect: Mood normal.         Behavior: Behavior normal.       Patient Data   Labs Ordered/Reviewed - No data to display  CT CERVICAL SPINE WO IV CONTRAST   Final Result by Edi, Radresults In (01/31 1629)   No acute fracture or acute process.       Extensive underlying degenerative changes with stable postsurgical  changes at C3-4 and C4-5 level with intact hardware.      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       All CT scans are performed using dose optimization techniques as appropriate to a performed exam, including bilateral limited to one or more of the following on a automated exposure control adjustments of the mA and/or kv according to the patient size, use of iterative reconstruction technique.      The examination is limited due to its noncontrast technique.             Radiologist location ID: [facility]           Medical Decision Making        Medical Decision Making  Patient presents to the emergency department via EMS for neck pain.  CT scan obtained of the neck that shows no acute fracture acute process.  CT scan does show some chronic changes.  I had a detailed discussion with the patient regarding all findings.  Patient will be discharged home.  He was advised to follow up with his PCP within a week return to the ED with any new or worsening symptoms.  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  Spinal cord impingement is suspected, however, this finding is stable.  Patient states he should discuss possibilities of an MRI with his PCP.  No questions or concerns voiced by the patient    Amount and/or Complexity of Data Reviewed  Radiology: ordered. Decision-making details documented in ED Course.      ED Course as of 05/19/23 1647   Fri May 19, 2023   1631 FINDINGS:     The bony alignment is unremarkable.   Extensive underlying degenerative changes. Stable post surgical changes with grossly intact hardware.  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 Spinal cord impingement is suspected. However this finding is stable.  No evidence of fracture.         IMPRESSION:  No acute fracture or acute process.      Extensive underlying degenerative changes with stable postsurgical changes at C3-4 and C4-5 level with intact hardware.     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      All CT scans are performed using dose optimization techniques as appropriate to a performed exam, including bilateral limited to one or more of the following on a automated exposure control adjustments of the mA and/or kv according to the patient size, use of iterative reconstruction technique.     The examination is limited due to its noncontrast technique.                            Clinical Impression   Neck pain (Primary)       Disposition: Discharged

## 2023-05-19 NOTE — ED Notes (Signed)
Patient went to radiology with staff.

## 2023-06-07 ENCOUNTER — Other Ambulatory Visit (INDEPENDENT_AMBULATORY_CARE_PROVIDER_SITE_OTHER): Payer: Self-pay

## 2023-06-07 DIAGNOSIS — I517 Cardiomegaly: Secondary | ICD-10-CM

## 2023-06-07 NOTE — Telephone Encounter (Signed)
 Last office visit- 06/27/2022  Follow up scheduled- 06/21/2023    30 day supply to get to appointment
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
## 2023-06-21 ENCOUNTER — Ambulatory Visit (INDEPENDENT_AMBULATORY_CARE_PROVIDER_SITE_OTHER): Payer: Medicare PPO | Admitting: Cardiovascular Disease

## 2023-06-21 ENCOUNTER — Other Ambulatory Visit: Payer: Self-pay

## 2023-06-21 ENCOUNTER — Encounter (INDEPENDENT_AMBULATORY_CARE_PROVIDER_SITE_OTHER): Payer: Self-pay | Admitting: Cardiovascular Disease

## 2023-06-21 VITALS — BP 106/66 | HR 89 | Ht 68.0 in | Wt 216.6 lb

## 2023-06-21 DIAGNOSIS — I2510 Atherosclerotic heart disease of native coronary artery without angina pectoris: Secondary | ICD-10-CM

## 2023-06-21 DIAGNOSIS — I10 Essential (primary) hypertension: Secondary | ICD-10-CM

## 2023-06-21 DIAGNOSIS — E785 Hyperlipidemia, unspecified: Secondary | ICD-10-CM

## 2023-06-21 DIAGNOSIS — I679 Cerebrovascular disease, unspecified: Secondary | ICD-10-CM
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
## 2023-07-24 ENCOUNTER — Other Ambulatory Visit (INDEPENDENT_AMBULATORY_CARE_PROVIDER_SITE_OTHER): Payer: Self-pay | Admitting: Cardiovascular Disease

## 2023-07-24 DIAGNOSIS — I517 Cardiomegaly: Secondary | ICD-10-CM

## 2023-07-24 NOTE — Telephone Encounter (Signed)
 Last office visit- 06/21/2023  Follow up scheduled- 01/12/2024

## 2023-09-05 ENCOUNTER — Encounter (INDEPENDENT_AMBULATORY_CARE_PROVIDER_SITE_OTHER): Payer: Self-pay

## 2023-09-11 ENCOUNTER — Other Ambulatory Visit: Payer: Self-pay

## 2023-09-11 ENCOUNTER — Encounter (HOSPITAL_COMMUNITY): Payer: Self-pay

## 2023-09-11 ENCOUNTER — Emergency Department (HOSPITAL_COMMUNITY)

## 2023-09-11 ENCOUNTER — Emergency Department: Admission: EM | Admit: 2023-09-11 | Discharge: 2023-09-11 | Disposition: A | Discharge status: Home / Self Care

## 2023-09-11 DIAGNOSIS — R000 Tachycardia, unspecified: Secondary | ICD-10-CM | POA: Insufficient documentation

## 2023-09-11 DIAGNOSIS — T40725A Adverse effect of synthetic cannabinoids, initial encounter: Secondary | ICD-10-CM

## 2023-09-11 DIAGNOSIS — T40715A Adverse effect of cannabis, initial encounter: Secondary | ICD-10-CM | POA: Insufficient documentation

## 2023-09-11 DIAGNOSIS — R079 Chest pain, unspecified: Secondary | ICD-10-CM | POA: Insufficient documentation

## 2023-09-11 DIAGNOSIS — F419 Anxiety disorder, unspecified: Secondary | ICD-10-CM | POA: Insufficient documentation

## 2023-09-11 DIAGNOSIS — R112 Nausea with vomiting, unspecified: Secondary | ICD-10-CM | POA: Insufficient documentation

## 2023-09-11 DIAGNOSIS — R0602 Shortness of breath: Secondary | ICD-10-CM | POA: Insufficient documentation

## 2023-09-11 LAB — DRUG SCREEN, WITH CONFIRMATION, URINE
AMPHETAMINES, URINE: NEGATIVE
BARBITURATES URINE: NEGATIVE
BENZODIAZEPINES URINE: NEGATIVE
BUPRENORPHINE URINE: NEGATIVE
CANNABINOIDS URINE: POSITIVE — AB
COCAINE METABOLITES URINE: NEGATIVE
CREATININE RANDOM URINE: 235 mg/dL (ref >=20)
ECSTASY/MDMA URINE: NEGATIVE
FENTANYL, RANDOM URINE: NEGATIVE
METHADONE URINE: NEGATIVE
OPIATES URINE (LOW CUTOFF): NEGATIVE
OXYCODONE URINE: NEGATIVE

## 2023-09-11 MED ORDER — LORAZEPAM 0.5 MG TABLET
0.5000 mg | ORAL_TABLET | ORAL | Status: AC
Start: 2023-09-11 — End: 2023-09-11
  Administered 2023-09-11: 0.5 mg via ORAL
  Filled 2023-09-11: qty 1

## 2023-09-11 MED ORDER — ONDANSETRON 4 MG DISINTEGRATING TABLET
4.0000 mg | ORAL_TABLET | ORAL | Status: DC
Start: 2023-09-11 — End: 2023-09-11

## 2023-09-11 NOTE — ED Nurses Note (Signed)

## 2023-09-11 NOTE — ED Provider Notes (Signed)
 [facility] - Emergency Department  ED Primary Provider Note  History of Present Illness   Chief Complaint   Patient presents with    Anxiety     Kamir Selover is a 68 y.o. male who had concerns including Anxiety.  Arrival: The patient arrived by Ambulance    This is a 68 year old male patient with a history of CAD, esophageal reflux, dm T2, hyperlipidemia, and hypertension who presents to the emergency department via EMS with acute onset shortness of breath, chest pain and anxiety after smoking a vape that he thought was nicotine.  Patient states that once he took a puff off of the vape, he  became extremely short of breath and anxious.  Patient also endorses complaints of nausea and vomiting.  Patient denies lightheadedness/dizziness, palpitations, abdominal pain, fever/chills, hemoptysis, sore throat.       History provided by:  EMS personnel and patient  Language interpreter used: No      History Reviewed This Encounter:   Patient's allergies, medical and surgical history, home medications and current problem list have been reviewed.    Physical Exam   ED Triage Vitals   BP (Non-Invasive) 09/11/23 1909 (!) 167/89   Heart Rate 09/11/23 1909 (!) 113   Respiratory Rate 09/11/23 1909 (!) 21   Temperature 09/11/23 1909 36.5 C (97.7 F)   SpO2 09/11/23 1909 95 %   Weight 09/11/23 1907 98 kg (216 lb)   Height 09/11/23 1907 1.727 m (5\' 8" )     Physical Exam  Vitals and nursing note reviewed.   Constitutional:       General: He is in acute distress (vomiting, anxious, and very verbal).      Appearance: He is well-groomed. He is ill-appearing (chronically and acutely). He is not toxic-appearing or diaphoretic.   HENT:      Head: Normocephalic and atraumatic.      Nose: No congestion.      Mouth/Throat:      Mouth: Mucous membranes are moist.   Eyes:      General: No scleral icterus.     Extraocular Movements: Extraocular movements intact.      Pupils: Pupils are equal, round, and reactive to light.    Neck:      Trachea: Phonation normal. No tracheal tenderness.   Cardiovascular:      Rate and Rhythm: Normal rate and regular rhythm.      Pulses: Normal pulses.           Radial pulses are 2+ on the right side and 2+ on the left side.      Heart sounds: Normal heart sounds, S1 normal and S2 normal. No murmur heard.  Pulmonary:      Effort: Pulmonary effort is normal. No accessory muscle usage or respiratory distress.      Breath sounds: Normal breath sounds and air entry. No decreased air movement. No wheezing or rales.   Chest:      Chest wall: No tenderness.   Abdominal:      General: Bowel sounds are normal.      Palpations: Abdomen is soft.      Tenderness: There is no right CVA tenderness or left CVA tenderness.      Comments: +nausea and vomiting     Musculoskeletal:         General: Normal range of motion.      Cervical back: Full passive range of motion without pain.      Right lower leg:  No edema.      Left lower leg: No edema.   Skin:     General: Skin is warm and dry.      Capillary Refill: Capillary refill takes less than 2 seconds.   Neurological:      General: No focal deficit present.      Mental Status: He is oriented to person, place, and time.      GCS: GCS eye subscore is 4. GCS verbal subscore is 5. GCS motor subscore is 6.      Cranial Nerves: Cranial nerves 2-12 are intact. No cranial nerve deficit.      Sensory: Sensation is intact.      Motor: Motor function is intact.      Coordination: Coordination is intact.   Psychiatric:         Mood and Affect: Mood is anxious.         Behavior: Behavior is agitated. Behavior is cooperative.       Patient Data     Labs Ordered/Reviewed   DRUG SCREEN, WITH CONFIRMATION, URINE - Abnormal; Notable for the following components:       Result Value    CANNABINOIDS URINE Positive (*)     All other components within normal limits    Narrative:     Drug screening results are intended for medical use only and are presumptive/unconfirmed. When unexpected  results are obtained, consider definitive testing if it has not been ordered.     CARBOXY - TETRAHYDROCANNABINOL (THC) CONFIRMATION, URINE     No orders to display     Medical Decision Making        Medical Decision Making  Presents to the emergency department with the acute onset chest pain, shortness of breath and anxiety after smoking what he thought was a nicotine vape.  Patient presented extremely anxious, verbal and agitated.  He was also noted to be actively vomiting upon arrival to the ER.    Differential diagnoses include but not limited to pneumothorax, MI, esophageal rupture, pneumomediastinum, PE, anxiety attack    Twelve lead EKG was normal with the exception of a sinus tach rate of 110.  Chest x-ray was normal.  Urine was positive for THC.    Diagnosis:  Adverse effect of THC consumption.    Amount and/or Complexity of Data Reviewed  Radiology: ordered.  ECG/medicine tests: ordered and independent interpretation performed.    Risk  Prescription drug management.      ED Course as of 09/11/23 2022   Mon Sep 11, 2023   2009 CANNABINOID [facility]), URINE(!): Positive   2012 Chest x-ray reviewed by me: No acute pulmonary process.  Twelve lead EKG shows:  Sinus tach with a rate of 110, normal axis.  Borderline first-degree heart block with a PR interval of 208 milliseconds.  QRS 76 milliseconds.  No overt signs of ischemia or pattern of injury noted.                   Medications Ordered/Administered in the ED   ondansetron  (ZOFRAN  ODT) rapid dissolve tablet (has no administration in time range)   LORazepam (ATIVAN) tablet (0.5 mg Oral Given 09/11/23 1955)     Clinical Impression   Adverse effect of synthetic cannabinoids, initial encounter (Primary)       Disposition: Discharged

## 2023-09-11 NOTE — ED Triage Notes (Signed)
 EMS was called to residence for shortness of breath. Patient states he hit a vape (unknown if nicotine or THC) and got short of breath and anxious after. Patient is nauseated and vomiting now.

## 2023-09-12 DIAGNOSIS — R000 Tachycardia, unspecified: Secondary | ICD-10-CM

## 2023-09-12 DIAGNOSIS — I440 Atrioventricular block, first degree: Secondary | ICD-10-CM

## 2023-09-12 LAB — ECG 12 LEAD
Atrial Rate: 110 {beats}/min
Calculated P Axis: 54 degrees
Calculated R Axis: 30 degrees
Calculated T Axis: 30 degrees
PR Interval: 208 ms
QRS Duration: 76 ms
QT Interval: 322 ms
QTC Calculation: 435 ms
Ventricular rate: 110 {beats}/min

## 2023-09-14 LAB — CARBOXY - TETRAHYDROCANNABINOL (THC) CONFIRMATION, URINE: THC-COOH: 190 ng/mL — ABNORMAL HIGH (ref <=25)

## 2023-10-07 ENCOUNTER — Encounter (HOSPITAL_COMMUNITY): Payer: Self-pay

## 2023-10-07 ENCOUNTER — Emergency Department (HOSPITAL_COMMUNITY)

## 2023-10-07 ENCOUNTER — Other Ambulatory Visit: Payer: Self-pay

## 2023-10-07 ENCOUNTER — Inpatient Hospital Stay
Admission: EM | Admit: 2023-10-07 | Discharge: 2023-10-12 | DRG: 871 | Disposition: A | Discharge status: Home / Self Care | Attending: INTERNAL MEDICINE | Admitting: INTERNAL MEDICINE

## 2023-10-07 DIAGNOSIS — Z79899 Other long term (current) drug therapy: Secondary | ICD-10-CM | POA: Insufficient documentation

## 2023-10-07 DIAGNOSIS — Z7902 Long term (current) use of antithrombotics/antiplatelets: Secondary | ICD-10-CM | POA: Insufficient documentation

## 2023-10-07 DIAGNOSIS — A4189 Other specified sepsis: Secondary | ICD-10-CM | POA: Insufficient documentation

## 2023-10-07 DIAGNOSIS — Z8673 Personal history of transient ischemic attack (TIA), and cerebral infarction without residual deficits: Secondary | ICD-10-CM

## 2023-10-07 DIAGNOSIS — J123 Human metapneumovirus pneumonia: Secondary | ICD-10-CM | POA: Insufficient documentation

## 2023-10-07 DIAGNOSIS — I110 Hypertensive heart disease with heart failure: Secondary | ICD-10-CM | POA: Diagnosis present

## 2023-10-07 DIAGNOSIS — R000 Tachycardia, unspecified: Secondary | ICD-10-CM | POA: Insufficient documentation

## 2023-10-07 DIAGNOSIS — J9601 Acute respiratory failure with hypoxia: Secondary | ICD-10-CM | POA: Diagnosis present

## 2023-10-07 DIAGNOSIS — Z7982 Long term (current) use of aspirin: Secondary | ICD-10-CM

## 2023-10-07 DIAGNOSIS — F39 Unspecified mood [affective] disorder: Secondary | ICD-10-CM | POA: Diagnosis present

## 2023-10-07 DIAGNOSIS — J9811 Atelectasis: Secondary | ICD-10-CM | POA: Insufficient documentation

## 2023-10-07 DIAGNOSIS — B9781 Human metapneumovirus as the cause of diseases classified elsewhere: Secondary | ICD-10-CM | POA: Insufficient documentation

## 2023-10-07 DIAGNOSIS — E785 Hyperlipidemia, unspecified: Secondary | ICD-10-CM | POA: Diagnosis present

## 2023-10-07 DIAGNOSIS — Z794 Long term (current) use of insulin: Secondary | ICD-10-CM | POA: Insufficient documentation

## 2023-10-07 DIAGNOSIS — J986 Disorders of diaphragm: Secondary | ICD-10-CM | POA: Insufficient documentation

## 2023-10-07 DIAGNOSIS — E119 Type 2 diabetes mellitus without complications: Secondary | ICD-10-CM | POA: Diagnosis present

## 2023-10-07 DIAGNOSIS — J180 Bronchopneumonia, unspecified organism: Secondary | ICD-10-CM | POA: Insufficient documentation

## 2023-10-07 DIAGNOSIS — J189 Pneumonia, unspecified organism: Secondary | ICD-10-CM | POA: Diagnosis present

## 2023-10-07 DIAGNOSIS — Z7984 Long term (current) use of oral hypoglycemic drugs: Secondary | ICD-10-CM

## 2023-10-07 DIAGNOSIS — G2581 Restless legs syndrome: Secondary | ICD-10-CM | POA: Diagnosis present

## 2023-10-07 DIAGNOSIS — E1140 Type 2 diabetes mellitus with diabetic neuropathy, unspecified: Secondary | ICD-10-CM | POA: Insufficient documentation

## 2023-10-07 DIAGNOSIS — Z89022 Acquired absence of left finger(s): Secondary | ICD-10-CM

## 2023-10-07 DIAGNOSIS — Z7985 Long-term (current) use of injectable non-insulin antidiabetic drugs: Secondary | ICD-10-CM | POA: Insufficient documentation

## 2023-10-07 DIAGNOSIS — K219 Gastro-esophageal reflux disease without esophagitis: Secondary | ICD-10-CM | POA: Diagnosis present

## 2023-10-07 DIAGNOSIS — I5042 Chronic combined systolic (congestive) and diastolic (congestive) heart failure: Secondary | ICD-10-CM | POA: Diagnosis present

## 2023-10-07 DIAGNOSIS — I252 Old myocardial infarction: Secondary | ICD-10-CM

## 2023-10-07 DIAGNOSIS — A419 Sepsis, unspecified organism: Principal | ICD-10-CM | POA: Diagnosis present

## 2023-10-07 DIAGNOSIS — I2510 Atherosclerotic heart disease of native coronary artery without angina pectoris: Secondary | ICD-10-CM | POA: Diagnosis present

## 2023-10-07 DIAGNOSIS — Z955 Presence of coronary angioplasty implant and graft: Secondary | ICD-10-CM | POA: Insufficient documentation

## 2023-10-07 LAB — CBC WITH DIFF
BASOPHIL #: <0.04 10*3/uL (ref <=0.20)
BASOPHIL %: 0.4 %
EOSINOPHIL #: 0.21 10*3/uL (ref <=0.50)
EOSINOPHIL %: 2.5 %
HCT: 45.1 % (ref 38.9–52.0)
HGB: 16.5 g/dL (ref 13.4–17.5)
IMMATURE GRANULOCYTE #: 0.04 10*3/uL (ref <0.10)
IMMATURE GRANULOCYTE %: 0.5 % (ref 0.0–1.0)
LYMPHOCYTE #: 2.74 10*3/uL (ref 1.00–4.80)
LYMPHOCYTE %: 32.5 %
MCH: 30.4 pg (ref 26.0–32.0)
MCHC: 36.6 g/dL — ABNORMAL HIGH (ref 31.0–35.5)
MCV: 83.1 fL (ref 78.0–100.0)
MONOCYTE #: 0.85 10*3/uL (ref 0.20–1.10)
MONOCYTE %: 10.1 %
MPV: 9.2 fL (ref 8.7–12.5)
NEUTROPHIL #: 4.55 10*3/uL (ref 1.50–7.70)
NEUTROPHIL %: 54 %
PLATELETS: 166 10*3/uL (ref 150–400)
RBC: 5.43 10*6/uL (ref 4.50–6.10)
RDW-CV: 12.9 % (ref 11.5–15.5)
WBC: 8.4 10*3/uL (ref 3.7–11.0)

## 2023-10-07 LAB — BLOOD GAS
%FIO2 (ARTERIAL): 28 %
BASE EXCESS (ARTERIAL): 4.7 mmol/L — ABNORMAL HIGH (ref 0.0–3.0)
BICARBONATE (ARTERIAL): 28.6 mmol/L — ABNORMAL HIGH (ref 21.0–28.0)
O2 SATURATION (ARTERIAL): 96.7 % (ref 94.0–98.0)
PAO2/FIO2 RATIO: 286
PCO2 (ARTERIAL): 37 mmHg (ref 35–45)
PH (ARTERIAL): 7.49 — ABNORMAL HIGH (ref 7.35–7.45)
PO2 (ARTERIAL): 80 mmHg — ABNORMAL LOW (ref 83–108)

## 2023-10-07 LAB — URINALYSIS, MICROSCOPIC

## 2023-10-07 LAB — COMPREHENSIVE METABOLIC PANEL, NON-FASTING
ALBUMIN: 3.6 g/dL (ref 3.4–4.8)
ALKALINE PHOSPHATASE: 93 U/L (ref 45–115)
ALT (SGPT): 33 U/L (ref <43)
ANION GAP: 14 mmol/L — ABNORMAL HIGH (ref 4–13)
AST (SGOT): 33 U/L (ref 11–34)
BILIRUBIN TOTAL: 4.1 mg/dL — ABNORMAL HIGH (ref 0.3–1.3)
BUN/CREA RATIO: 15 (ref 6–22)
BUN: 17 mg/dL (ref 8–25)
CALCIUM: 8.6 mg/dL (ref 8.6–10.3)
CHLORIDE: 101 mmol/L (ref 96–111)
CO2 TOTAL: 24 mmol/L (ref 23–31)
CREATININE: 1.1 mg/dL (ref 0.75–1.35)
ESTIMATED GFR - MALE: 73 mL/min/BSA (ref >=60)
GLUCOSE: 185 mg/dL — ABNORMAL HIGH (ref 65–125)
POTASSIUM: 3.9 mmol/L (ref 3.5–5.1)
PROTEIN TOTAL: 7.7 g/dL (ref 6.0–8.0)
SODIUM: 139 mmol/L (ref 136–145)

## 2023-10-07 LAB — ECG 12 LEAD
Atrial Rate: 150 {beats}/min
Calculated R Axis: 58 degrees
Calculated T Axis: 35 degrees
PR Interval: 128 ms
QRS Duration: 84 ms
QT Interval: 302 ms
QTC Calculation: 477 ms
Ventricular rate: 150 {beats}/min

## 2023-10-07 LAB — URINALYSIS, MACROSCOPIC
BILIRUBIN: NEGATIVE mg/dL
GLUCOSE: >=1000 mg/dL — AB
KETONES: NEGATIVE mg/dL
LEUKOCYTES: NEGATIVE WBCs/uL
NITRITE: NEGATIVE
PH: 6
PROTEIN: 100 mg/dL — AB
SPECIFIC GRAVITY: 1.015 (ref 1.005–1.030)

## 2023-10-07 LAB — COVID-19 ~~LOC~~ MOLECULAR LAB TESTING
INFLUENZA VIRUS TYPE A: NOT DETECTED
INFLUENZA VIRUS TYPE B: NOT DETECTED
RSV: NOT DETECTED
SARS-COV-2: NOT DETECTED

## 2023-10-07 LAB — TROPONIN-I: TROPONIN-I HS: 9.9 ng/L (ref <19.8)

## 2023-10-07 LAB — LACTIC ACID - FIRST REFLEX: LACTIC ACID: 0.6 mmol/L (ref 0.5–2.2)

## 2023-10-07 LAB — LACTIC ACID LEVEL W/ REFLEX FOR LEVEL >2.0: LACTIC ACID: 3.4 mmol/L — ABNORMAL HIGH (ref 0.5–2.2)

## 2023-10-07 LAB — BLUE TOP TUBE

## 2023-10-07 LAB — PROCALCITONIN: PROCALCITONIN: 0.085 ng/mL (ref <=0.500)

## 2023-10-07 LAB — B-TYPE NATRIURETIC PEPTIDE (BNP),PLASMA: BNP: 30 pg/mL (ref <=100)

## 2023-10-07 MED ORDER — IPRATROPIUM BROMIDE 0.02 % SOLUTION FOR INHALATION
0.5000 mg | Freq: Four times a day (QID) | RESPIRATORY_TRACT | Status: DC
Start: 2023-10-08 — End: 2023-10-12
  Administered 2023-10-08 – 2023-10-10 (×10): 0.5 mg via RESPIRATORY_TRACT
  Administered 2023-10-10: 0 mg via RESPIRATORY_TRACT
  Administered 2023-10-11 (×3): 0.5 mg via RESPIRATORY_TRACT
  Administered 2023-10-11: 0 mg via RESPIRATORY_TRACT
  Administered 2023-10-12 (×3): 0.5 mg via RESPIRATORY_TRACT

## 2023-10-07 MED ORDER — ATORVASTATIN 40 MG TABLET
40.0000 mg | ORAL_TABLET | Freq: Every evening | ORAL | Status: DC
Start: 2023-10-07 — End: 2023-10-12
  Administered 2023-10-07 – 2023-10-11 (×4): 40 mg via ORAL
  Filled 2023-10-07 (×7): qty 1

## 2023-10-07 MED ORDER — ACETAMINOPHEN 325 MG TABLET
650.0000 mg | ORAL_TABLET | ORAL | Status: DC | PRN
Start: 2023-10-07 — End: 2023-10-12
  Administered 2023-10-07 – 2023-10-11 (×7): 650 mg via ORAL
  Filled 2023-10-07 (×7): qty 2

## 2023-10-07 MED ORDER — FUROSEMIDE 40 MG TABLET
20.0000 mg | ORAL_TABLET | Freq: Every day | ORAL | Status: DC
Start: 2023-10-08 — End: 2023-10-12
  Administered 2023-10-08 – 2023-10-12 (×5): 20 mg via ORAL
  Filled 2023-10-07 (×4): qty 1
  Filled 2023-10-07 (×2): qty 0.5
  Filled 2023-10-07: qty 1

## 2023-10-07 MED ORDER — SODIUM CHLORIDE 0.9 % (FLUSH) INJECTION SYRINGE
1.0000 mL | INJECTION | INTRAMUSCULAR | Status: DC | PRN
Start: 2023-10-07 — End: 2023-10-12

## 2023-10-07 MED ORDER — LEVALBUTEROL 0.63 MG/3 ML SOLUTION FOR NEBULIZATION
0.6300 mg | INHALATION_SOLUTION | Freq: Four times a day (QID) | RESPIRATORY_TRACT | Status: DC
Start: 2023-10-08 — End: 2023-10-12
  Administered 2023-10-08 – 2023-10-09 (×7): 0.63 mg via RESPIRATORY_TRACT
  Administered 2023-10-10: 0 mg via RESPIRATORY_TRACT
  Administered 2023-10-10 – 2023-10-11 (×4): 0.63 mg via RESPIRATORY_TRACT
  Administered 2023-10-11: 0 mg via RESPIRATORY_TRACT
  Administered 2023-10-11 – 2023-10-12 (×5): 0.63 mg via RESPIRATORY_TRACT

## 2023-10-07 MED ORDER — RANOLAZINE ER 500 MG TABLET,EXTENDED RELEASE,12 HR
500.0000 mg | ORAL_TABLET | Freq: Two times a day (BID) | ORAL | Status: DC
Start: 2023-10-07 — End: 2023-10-12
  Administered 2023-10-07: 0 mg via ORAL
  Administered 2023-10-08 – 2023-10-12 (×9): 500 mg via ORAL
  Filled 2023-10-07 (×12): qty 1

## 2023-10-07 MED ORDER — METHYLPREDNISOLONE SOD SUCCINATE 40 MG/ML SOLUTION FOR INJ. WRAPPER
20.0000 mg | Freq: Three times a day (TID) | INTRAMUSCULAR | Status: AC
Start: 2023-10-07 — End: ?
  Administered 2023-10-07 – 2023-10-11 (×11): 20 mg via INTRAVENOUS
  Filled 2023-10-07 (×10): qty 1

## 2023-10-07 MED ORDER — SODIUM CHLORIDE 0.9 % (FLUSH) INJECTION SYRINGE
10.0000 mL | INJECTION | Freq: Three times a day (TID) | INTRAMUSCULAR | Status: DC
Start: 2023-10-07 — End: 2023-10-12
  Administered 2023-10-07 – 2023-10-11 (×12): 10 mL
  Administered 2023-10-11: 0 mL
  Administered 2023-10-12 (×2): 10 mL

## 2023-10-07 MED ORDER — SODIUM CHLORIDE 0.9 % IV BOLUS
1000.0000 mL | INJECTION | Status: AC
Start: 2023-10-07 — End: 2023-10-07
  Administered 2023-10-07: 0 mL via INTRAVENOUS
  Administered 2023-10-07: 1000 mL via INTRAVENOUS

## 2023-10-07 MED ORDER — SODIUM CHLORIDE 0.9 % INJECTION SOLUTION
2.0000 g | INTRAMUSCULAR | Status: DC
Start: 2023-10-07 — End: 2023-10-07
  Administered 2023-10-07: 2 g via INTRAVENOUS
  Filled 2023-10-07: qty 20

## 2023-10-07 MED ORDER — METOPROLOL TARTRATE 50 MG TABLET
100.0000 mg | ORAL_TABLET | Freq: Two times a day (BID) | ORAL | Status: DC
Start: 2023-10-07 — End: 2023-10-12
  Administered 2023-10-07 – 2023-10-12 (×10): 100 mg via ORAL
  Filled 2023-10-07 (×5): qty 2
  Filled 2023-10-07: qty 4
  Filled 2023-10-07 (×3): qty 2
  Filled 2023-10-07: qty 4
  Filled 2023-10-07: qty 2
  Filled 2023-10-07 (×2): qty 4
  Filled 2023-10-07: qty 2

## 2023-10-07 MED ORDER — SODIUM CHLORIDE 0.9% FLUSH BAG - 250 ML
INTRAVENOUS | Status: DC | PRN
Start: 2023-10-07 — End: 2023-10-12

## 2023-10-07 MED ORDER — AZITHROMYCIN 500 MG TABLET
500.0000 mg | ORAL_TABLET | ORAL | Status: AC
Start: 2023-10-07 — End: 2023-10-07
  Administered 2023-10-07: 500 mg via ORAL
  Filled 2023-10-07: qty 1

## 2023-10-07 MED ORDER — ACETAMINOPHEN 500 MG TABLET
1000.0000 mg | ORAL_TABLET | ORAL | Status: AC
Start: 2023-10-07 — End: 2023-10-07
  Administered 2023-10-07: 1000 mg via ORAL
  Filled 2023-10-07: qty 2

## 2023-10-07 MED ORDER — SERTRALINE 50 MG TABLET
50.0000 mg | ORAL_TABLET | Freq: Every day | ORAL | Status: DC
Start: 2023-10-08 — End: 2023-10-12
  Administered 2023-10-08 – 2023-10-12 (×5): 50 mg via ORAL
  Filled 2023-10-07 (×5): qty 1

## 2023-10-07 MED ORDER — LEVALBUTEROL 0.63 MG/3 ML SOLUTION FOR NEBULIZATION
0.6300 mg | INHALATION_SOLUTION | Freq: Four times a day (QID) | RESPIRATORY_TRACT | Status: DC | PRN
Start: 2023-10-07 — End: 2023-10-12
  Administered 2023-10-07: 0.63 mg via RESPIRATORY_TRACT

## 2023-10-07 MED ORDER — WATER FOR INJECTION, STERILE INJECTION SOLUTION
1.0000 g | Freq: Three times a day (TID) | INTRAVENOUS | Status: AC
Start: 2023-10-07 — End: ?
  Administered 2023-10-07 – 2023-10-09 (×5): 1 g via INTRAVENOUS
  Filled 2023-10-07 (×6): qty 20

## 2023-10-07 MED ORDER — ALUMINUM-MAG HYDROXIDE-SIMETHICONE 200 MG-200 MG-20 MG/5 ML ORAL SUSP
30.0000 mL | ORAL | Status: DC | PRN
Start: 2023-10-07 — End: 2023-10-12

## 2023-10-07 MED ORDER — CLOPIDOGREL 75 MG TABLET
75.0000 mg | ORAL_TABLET | Freq: Every day | ORAL | Status: DC
Start: 2023-10-08 — End: 2023-10-12
  Administered 2023-10-08 – 2023-10-12 (×5): 75 mg via ORAL
  Filled 2023-10-07 (×6): qty 1

## 2023-10-07 MED ORDER — ICOSAPENT ETHYL 1 GRAM CAPSULE
2.0000 g | ORAL_CAPSULE | Freq: Two times a day (BID) | ORAL | Status: DC
Start: 2023-10-07 — End: 2023-10-12
  Administered 2023-10-07 – 2023-10-12 (×10): 0 g via ORAL
  Filled 2023-10-07 (×2): qty 2

## 2023-10-07 MED ORDER — EZETIMIBE 10 MG TABLET
10.0000 mg | ORAL_TABLET | Freq: Every evening | ORAL | Status: DC
Start: 2023-10-07 — End: 2023-10-12
  Administered 2023-10-07 – 2023-10-11 (×4): 10 mg via ORAL
  Filled 2023-10-07 (×7): qty 1

## 2023-10-07 MED ORDER — ASPIRIN 81 MG TABLET,DELAYED RELEASE
81.0000 mg | DELAYED_RELEASE_TABLET | Freq: Every day | ORAL | Status: DC
Start: 2023-10-08 — End: 2023-10-12
  Administered 2023-10-08 – 2023-10-12 (×5): 81 mg via ORAL
  Filled 2023-10-07 (×6): qty 1

## 2023-10-07 MED ORDER — LIDOCAINE 4 % TOPICAL PATCH
2.0000 | MEDICATED_PATCH | Freq: Every day | CUTANEOUS | Status: DC
Start: 2023-10-08 — End: 2023-10-12
  Administered 2023-10-08 – 2023-10-09 (×2): 0 via TRANSDERMAL
  Administered 2023-10-10: 2 via TRANSDERMAL
  Administered 2023-10-11 – 2023-10-12 (×2): 0 via TRANSDERMAL
  Filled 2023-10-07 (×6): qty 2

## 2023-10-07 MED ORDER — INSULIN LISPRO 100 UNIT/ML SUB-Q SSIP PEN
1.0000 [IU] | INJECTION | Freq: Four times a day (QID) | SUBCUTANEOUS | Status: DC
Start: 2023-10-07 — End: 2023-10-07

## 2023-10-07 MED ORDER — SODIUM CHLORIDE 0.9 % (FLUSH) INJECTION SYRINGE
10.0000 mL | INJECTION | INTRAMUSCULAR | Status: DC | PRN
Start: 2023-10-07 — End: 2023-10-12

## 2023-10-07 MED ORDER — ERGOCALCIFEROL (VITAMIN D2) 1,250 MCG (50,000 UNIT) CAPSULE
50000.0000 [IU] | ORAL_CAPSULE | ORAL | Status: DC
Start: 2023-10-10 — End: 2023-10-12
  Administered 2023-10-10: 50000 [IU] via ORAL
  Filled 2023-10-07: qty 1

## 2023-10-07 MED ORDER — ONDANSETRON HCL (PF) 4 MG/2 ML INJECTION SOLUTION
4.0000 mg | Freq: Four times a day (QID) | INTRAMUSCULAR | Status: DC | PRN
Start: 2023-10-07 — End: 2023-10-12

## 2023-10-07 MED ORDER — GLUCAGON 1 MG SOLUTION FOR INJECTION
1.0000 mg | Freq: Once | INTRAMUSCULAR | Status: DC | PRN
Start: 2023-10-07 — End: 2023-10-12

## 2023-10-07 MED ORDER — IBUPROFEN 800 MG TABLET
800.0000 mg | ORAL_TABLET | ORAL | Status: AC
Start: 2023-10-07 — End: 2023-10-07
  Administered 2023-10-07: 800 mg via ORAL
  Filled 2023-10-07: qty 1

## 2023-10-07 MED ORDER — POLYETHYLENE GLYCOL 3350 17 GRAM ORAL POWDER PACKET
17.0000 g | Freq: Every day | ORAL | Status: DC
Start: 2023-10-08 — End: 2023-10-08
  Administered 2023-10-08: 0 g via ORAL

## 2023-10-07 MED ORDER — IPRATROPIUM 0.5 MG-ALBUTEROL 3 MG (2.5 MG BASE)/3 ML NEBULIZATION SOLN
3.0000 mL | INHALATION_SOLUTION | Freq: Four times a day (QID) | RESPIRATORY_TRACT | Status: DC
Start: 2023-10-07 — End: 2023-10-07
  Administered 2023-10-07: 0 mL via RESPIRATORY_TRACT

## 2023-10-07 MED ORDER — ROPINIROLE 0.5 MG TABLET
0.5000 mg | ORAL_TABLET | Freq: Three times a day (TID) | ORAL | Status: DC
Start: 2023-10-07 — End: 2023-10-12
  Administered 2023-10-07 – 2023-10-12 (×14): 0.5 mg via ORAL
  Filled 2023-10-07 (×16): qty 1

## 2023-10-07 MED ORDER — ENOXAPARIN 40 MG/0.4 ML SUBCUTANEOUS SYRINGE
40.0000 mg | INJECTION | Freq: Every day | SUBCUTANEOUS | Status: DC
Start: 2023-10-08 — End: 2023-10-12
  Administered 2023-10-08 – 2023-10-12 (×5): 40 mg via SUBCUTANEOUS
  Filled 2023-10-07 (×5): qty 0.4

## 2023-10-07 MED ORDER — DOXYCYCLINE HYCLATE 100 MG TABLET
100.0000 mg | ORAL_TABLET | Freq: Two times a day (BID) | ORAL | Status: DC
Start: 2023-10-07 — End: 2023-10-12
  Administered 2023-10-07 – 2023-10-12 (×10): 100 mg via ORAL
  Filled 2023-10-07 (×14): qty 1

## 2023-10-07 MED ORDER — ISOSORBIDE MONONITRATE ER 30 MG TABLET,EXTENDED RELEASE 24 HR
30.0000 mg | ORAL_TABLET | Freq: Every day | ORAL | Status: DC
Start: 2023-10-08 — End: 2023-10-12
  Administered 2023-10-08 – 2023-10-12 (×5): 30 mg via ORAL
  Filled 2023-10-07 (×5): qty 1

## 2023-10-07 MED ORDER — DEXTROSE 40 % ORAL GEL
15.0000 g | ORAL | Status: DC | PRN
Start: 2023-10-07 — End: 2023-10-12

## 2023-10-07 MED ORDER — DIPHENOXYLATE-ATROPINE 2.5 MG-0.025 MG TABLET
1.0000 | ORAL_TABLET | Freq: Four times a day (QID) | ORAL | Status: DC | PRN
Start: 2023-10-07 — End: 2023-10-12

## 2023-10-07 MED ORDER — MELATONIN 3 MG TABLET
3.0000 mg | ORAL_TABLET | Freq: Every evening | ORAL | Status: DC | PRN
Start: 2023-10-07 — End: 2023-10-12
  Administered 2023-10-07 – 2023-10-11 (×4): 3 mg via ORAL
  Filled 2023-10-07 (×6): qty 1

## 2023-10-07 MED ORDER — SENNOSIDES 8.6 MG-DOCUSATE SODIUM 50 MG TABLET
1.0000 | ORAL_TABLET | Freq: Two times a day (BID) | ORAL | Status: DC | PRN
Start: 2023-10-07 — End: 2023-10-12
  Filled 2023-10-07: qty 1

## 2023-10-07 MED ORDER — PANTOPRAZOLE 40 MG TABLET,DELAYED RELEASE
40.0000 mg | DELAYED_RELEASE_TABLET | Freq: Every day | ORAL | Status: DC
Start: 2023-10-08 — End: 2023-10-12
  Administered 2023-10-08 – 2023-10-12 (×5): 40 mg via ORAL
  Filled 2023-10-07 (×6): qty 1

## 2023-10-07 MED ORDER — DAPAGLIFLOZIN PROPANEDIOL 10 MG TABLET
10.0000 mg | ORAL_TABLET | Freq: Every day | ORAL | Status: DC
Start: 2023-10-08 — End: 2023-10-12
  Administered 2023-10-08 – 2023-10-12 (×5): 10 mg via ORAL
  Filled 2023-10-07 (×6): qty 1

## 2023-10-07 MED ORDER — DEXTROSE 10 % IN WATER (D10W) BOLUS
250.0000 mL | INJECTION | INTRAVENOUS | Status: DC | PRN
Start: 2023-10-07 — End: 2023-10-12

## 2023-10-07 NOTE — ED Provider Notes (Signed)
 [facility] - Emergency Department  ED Primary Provider Note  History of Present Illness   Chief Complaint   Patient presents with    Shortness of Breath     Edwin [name] is a 68 y.o. male who had concerns including Shortness of Breath.  Patient with a history of diabetes mellitus type 2, hypertension, hyperlipidemia, coronary artery disease status post 5 stents, TIA, and gastroesophageal reflux disease who did not feel well on Tuesday with runny nose, nasal congestion, chest congestion, and occasional productive cough.  Patient's symptoms worsened on Friday and then h developed chest tightness, shortness breath, and fever/chills this morning.  Patient last took Tylenol  this morning.  Patient was brought by EMS and was given a duo neb breathing treatment.  Patient was then put on oxygen 2 L per minute via nasal cannula after his breathing treatment with O2 saturation 100%.    Physical Exam   ED Triage Vitals [10/07/23 1919]   BP (Non-Invasive) 137/83   Heart Rate (!) 136   Respiratory Rate (!) 23   Temperature (!) 39.5 C (103.1 F)   SpO2 100 %   Weight 95.7 kg (211 lb)   Height 1.727 m (5' 8)     Constitutional:  68 y.o. male who appears in no distress. Normal color, no cyanosis.   HENT:   Head: Normocephalic and atraumatic.   Mouth/Throat: Oropharynx is clear and moist.   Eyes: EOMI, PERRL   Neck: Trachea midline. Neck supple.  Cardiovascular:  Sinus tachycardic, No murmurs, rubs or gallops.  Pulmonary/Chest: BS few rhonchi and wheezes bilaterally. No respiratory distress.  Abdominal: Bowel sounds present and normal. Abdomen soft, no tenderness, no rebound and no guarding.  Back: No midline spinal tenderness, no paraspinal tenderness        Musculoskeletal: No edema, tenderness or deformity.  Skin: warm and dry. No rash, erythema, pallor or cyanosis  Psychiatric: normal mood and affect. Behavior is normal.   Neurological: Patient alert and responsive, easily able to raise eyebrows, facial  muscles/expressions symmetric, speaking in fluent sentences, moving all extremities equally and fully, normal gait.  Cranial nerves 2-12 grossly intact.  Patient Data   Labs Ordered/Reviewed   COMPREHENSIVE METABOLIC PANEL, NON-FASTING - Abnormal; Notable for the following components:       Result Value    ANION GAP 14 (*)     GLUCOSE 185 (*)     BILIRUBIN TOTAL 4.1 (*)     All other components within normal limits   LACTIC ACID LEVEL W/ REFLEX FOR LEVEL >2.0 - Abnormal; Notable for the following components:    LACTIC ACID 3.4 (*)     All other components within normal limits   BLOOD GAS - Abnormal; Notable for the following components:    PH (ARTERIAL) 7.49 (*)     PO2 (ARTERIAL) 80 (*)     BICARBONATE (ARTERIAL) 28.6 (*)     BASE EXCESS (ARTERIAL) 4.7 (*)     All other components within normal limits    Narrative:     A reference range for Oxygen Saturation is provided but abnormal results will not flag in Epic.   CBC WITH DIFF - Abnormal; Notable for the following components:    MCHC 36.6 (*)     All other components within normal limits   B-TYPE NATRIURETIC PEPTIDE (BNP),PLASMA - Normal   TROPONIN-I - Normal    Narrative:     TROPONIN INTERPRETATIVE COMMENT:    *ERRONEOUS RESULTS MAY BE OBTAINED  ON SAMPLES FROM PATIENTS WHO HAVE BEEN TREATED WITH MOUSE MONOCLONAL ANTIBODIES OR WHO HAVE RECEIVED THEM FOR DIAGNOSTIC PURPOSES.      TROPONIN I VALUES ARE OF THE GREATEST VALUE WHEN SERIALLY PERFORMED.    PRESENCE OF HETEROPHILE ANTIBODIES HAVE BEEN KNOWN TO CAUSE ERRONEOUS RESULTS.    PROCALCITONIN - Normal    Narrative:     A CONCENTRATION OF <0.5 ng/mL REPRESENTS A LOW RISK OF SEVERE SEPSIS AND/OR SEPSIS SHOCK.    A CONCENTRATION OF >2 ng/mL REPRESENTS A HIGH RISK OF SEVERE SEPSIS AND/OR SEPSIS SHOCK.  NEVERTHELESS, CONCENTRATIONS <0.5 ng/mL DO NOT EXCLUDE AN INFECTION ON ACCOUNT OF LOCALIZED INFECTIONS (WITHOUT SYSTEMIC SIGNS) WHICH CAN BE ASSOCIATED WITH SUCH LOW CONCENTRATIONS, OR A SYSTEMIC INFECTION IN ITS  INITIAL STAGES. (<6 HOURS). FURTHERMORE, INCREASED PROCALCITONIN CAN OCCUR WITHOUT INFECTION.  PCT CONCENTRATIONS BETWEEN 0.5 AND 2.0 ng/mL SHOULD BE INTERPRETED TAKING INTO ACCOUNT THE PATIENT'S HISTORY.  IT IS RECOMMENDED TO RETEST PCT WITHIN 6-24 HOURS IF ANY CONCENTRATIONS <2 ng/mL ARE OBTAINED.     COVID-19 [place] MOLECULAR LAB TESTING - Normal    Narrative:     Results are for the simultaneous qualitative identification of SARS-CoV-2 (formerly 2019-nCoV), Influenza A, Influenza B virus and Respiratory Syncytial Virus (RSV) RNA. These etiologic agents are generally detectable in nasopharyngeal and nasal swabs during the ACUTE PHASE of infection. Hence, this test is intended to be performed on respiratory specimens collected from individuals with signs and symptoms of upper and lower respiratory tract infections who meet Centers for Disease Control and Prevention (CDC) clinical and/or epidemiological criteria for Coronavirus Disease 2019 (COVID-19) testing. CDC COVID-19 criteria for testing on human specimens is available at [facility] webpage information for Healthcare Professionals: Coronavirus Disease 2019 (COVID-19). (AffordableScrapbook.gl).     False-negative results may occur if the virus has genomic mutations, insertions, deletions or rearrangements or if performed very early in the course of illness. Otherwise, negative results indicate virus specific RNA targets are not detected; however, negative results do not preclude SARS CoV2 (COVID-19) infection,  Influenza A, Influenza B or RSV infection. Results should not be used as the sole basis for patient management decisions. Negative results must be combined with clinical observations, patient history, and epidemiological information. If upper respiratory tract infection is still suspected based on exposure history together with other clinical findings, repeat testing should be considered.      Disclaimer:  This assay has been  authorized by FDA under an Emergency Use Authorization for use in laboratories certified under the Clinical Laboratory Improvement Amendments of 1988 (CLIA), 42 U.S.C 263a, to perform high complexity tests. The Impacts of vaccines, antiviral therapeutics, antibiotics, chemotherapeutic or immunosuppressant drugs have not been evaluated.      Test methodology:  [employer] SARS -CoV-2 and Influenza A/B and RSV assay - real time reverse transcription polymerase chain reaction (RT-PCR) test on the [employer] LIAT analyzer [employer]).      [employer] SARS-Co-V-2 and Influenza A/B and RSV assay uses real-time reverse transcription polymerase chain reaction (RT-PCR) technology to rapidly (approximately 20 minutes)  detect and differentiate between SARS-CoV-2, Influenza A/B and Respiratory syncytial virus (RSV) from nasopharyngeal and nasal swabs.   ADULT ROUTINE BLOOD CULTURE, SET OF 2 BOTTLES (BACTERIA AND YEAST)   ADULT ROUTINE BLOOD CULTURE, SET OF 2 BOTTLES (BACTERIA AND YEAST)   LEGIONELLA URINE ANTIGEN   CBC/DIFF    Narrative:     The following orders were created for panel order CBC/DIFF.  Procedure  Abnormality         Status                     ---------                               -----------         ------                     CBC WITH IPQQ[[id number]]                Abnormal            Final result                 Please view results for these tests on the individual orders.   EXTRA TUBES    Narrative:     The following orders were created for panel order EXTRA TUBES.  Procedure                               Abnormality         Status                     ---------                               -----------         ------                     BLUE TOP ULAZ[[id number]]                                    In process                 GOLD TOP TUBE[[id number]]                                    In process                   Please view results for these tests on the individual orders.   BLUE TOP TUBE    GOLD TOP TUBE   LACTIC ACID - FIRST REFLEX   URINALYSIS, MACROSCOPIC AND MICROSCOPIC W/CULTURE REFLEX    Narrative:     The following orders were created for panel order URINALYSIS, MACROSCOPIC AND MICROSCOPIC W/CULTURE REFLEX.  Procedure                               Abnormality         Status                     ---------                               -----------         ------                     URINALYSIS, MACROSCOPIC[[id number]]  URINALYSIS, MICROSCOPIC[[id number]]                                                       Please view results for these tests on the individual orders.   URINALYSIS, MACROSCOPIC   URINALYSIS, MICROSCOPIC     CT CHEST WO IV CONTRAST   Final Result by Edi, Radresults In (06/21 2106)   Minimal tree-in-bud opacities in the left upper lobe, representing endobronchial pneumonia.                  Radiologist location ID: [id number]         XR CHEST AP   Final Result by Edi, Radresults In (06/21 2022)   1. No acute process showing no change from 09/11/2023.   2. Moderate chronic elevation of the right diaphragm.         Radiologist location ID: [facility]           Medical Decision Making        Medical Decision Making  Amount and/or Complexity of Data Reviewed  Labs: ordered. Decision-making details documented in ED Course.     Details: CBC, CMP, troponin I, D-dimer, procalcitonin, lactic acid, blood culture x2.    ABG.    Rapid RSV/influenza/COVID.  Radiology: ordered. Decision-making details documented in ED Course.     Details: Chest x-ray.  ECG/medicine tests: ordered. Decision-making details documented in ED Course.     Details: EKG.    Given 1 L of IV normal saline, Tylenol , and Motrin    Risk  OTC drugs.  Prescription drug management.  Decision regarding hospitalization.    Patient was seen for fever/chills, cough, and shortness of breath.    Patient is febrile and tachycardic.  Patient is on 2 L oxygen via nasal cannula with O2  saturation an 98-100%.    Patient was given Tylenol  1000 mg and ibuprofen 800 mg.    WBC 8.4.  Glucose 185.  Creatinine 1.1.  Bicarb 24.  Anion gap 14.  T bili 4.1 chronic.  Troponin I-HS 9.1.  BNP 30.  Procalcitonin.    Rapid RSV/influenza/COVID negative.    Lactic acid 3.4.    ABG with pH 7.4, pCO2 37, PO2 80, bicarb 28.6, and base excess 4.7.    Urinalysis and urine Legionella antigen pending.    Chest x-ray shows no acute findings.    EKG shows a sinus tachycardia with a rate 150.     Patient was given 2 L of IV normal saline, IV Rocephin, and oral azithromycin.    CT of the chest with out IV contrast pending.    Repeat vital signs improved.  ED Course as of 10/07/23 2119   Sat Oct 07, 2023   2047 I discussed the case with Dr. Thersia who agrees to accept the patient awaiting CT of the chest without IV contrast and urinalysis.   2047 I turned over patient's care to Dr. Tanda at the end of my shift          Differential diagnoses includes but not limited to viral upper respiratory infection, bronchitis, pneumonia, RSV, COVID, influenza, dehydration, electrolyte imbalance, sepsis, reactive airway disease, asthma/Chronic Obstructive Pulmonary Disease exacerbation, respiratory distress         Medications Ordered/Administered in the ED   NS flush syringe (has no administration in  time range)   NS bolus infusion 1,000 mL (1,000 mL Intravenous New Bag/New Syringe 10/07/23 2053)   cefTRIAXone (ROCEPHIN) 2 g in NS 20 mL IV injection (2 g Intravenous Given 10/07/23 2053)   acetaminophen  (TYLENOL ) tablet (1,000 mg Oral Given 10/07/23 1947)   ibuprofen (MOTRIN) tablet (800 mg Oral Given 10/07/23 1948)   NS bolus infusion 1,000 mL (0 mL Intravenous Stopped 10/07/23 2046)   azithromycin (ZITHROMAX) tablet (500 mg Oral Given 10/07/23 2054)     Clinical Impression   Sepsis (Primary)   Left upper lobe pneumonia       Disposition: Admitted
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
## 2023-10-07 NOTE — ED Triage Notes (Signed)
 EMS states SOB since Thursday. Duoneb given en route

## 2023-10-08 ENCOUNTER — Observation Stay (HOSPITAL_COMMUNITY)

## 2023-10-08 LAB — DRUG SCREEN, NO CONFIRMATION, URINE
AMPHETAMINES, URINE: NEGATIVE
BARBITURATES URINE: NEGATIVE
BENZODIAZEPINES URINE: NEGATIVE
BUPRENORPHINE URINE: NEGATIVE
CANNABINOIDS URINE: NEGATIVE
COCAINE METABOLITES URINE: NEGATIVE
CREATININE RANDOM URINE: 166 mg/dL (ref >=20)
ECSTASY/MDMA URINE: NEGATIVE
FENTANYL, RANDOM URINE: NEGATIVE
METHADONE URINE: NEGATIVE
OPIATES URINE (LOW CUTOFF): NEGATIVE
OXYCODONE URINE: NEGATIVE

## 2023-10-08 LAB — BASIC METABOLIC PANEL
ANION GAP: 8 mmol/L (ref 4–13)
BUN/CREA RATIO: 19 (ref 6–22)
BUN: 19 mg/dL (ref 8–25)
CALCIUM: 7.9 mg/dL — ABNORMAL LOW (ref 8.6–10.3)
CHLORIDE: 105 mmol/L (ref 96–111)
CO2 TOTAL: 25 mmol/L (ref 23–31)
CREATININE: 1 mg/dL (ref 0.75–1.35)
ESTIMATED GFR - MALE: 82 mL/min/BSA (ref >=60)
GLUCOSE: 145 mg/dL — ABNORMAL HIGH (ref 65–125)
POTASSIUM: 4 mmol/L (ref 3.5–5.1)
SODIUM: 138 mmol/L (ref 136–145)

## 2023-10-08 LAB — MAGNESIUM: MAGNESIUM: 2 mg/dL (ref 1.8–2.6)

## 2023-10-08 LAB — CBC WITH DIFF
BASOPHIL #: <0.04 10*3/uL (ref <=0.20)
BASOPHIL %: 0.3 %
EOSINOPHIL #: <0.04 10*3/uL (ref <=0.50)
EOSINOPHIL %: 0.1 %
HCT: 41.1 % (ref 38.9–52.0)
HGB: 14.5 g/dL (ref 13.4–17.5)
IMMATURE GRANULOCYTE #: <0.04 10*3/uL (ref <0.10)
IMMATURE GRANULOCYTE %: 0.4 % (ref 0.0–1.0)
LYMPHOCYTE #: 0.77 10*3/uL — ABNORMAL LOW (ref 1.00–4.80)
LYMPHOCYTE %: 9.9 %
MCH: 30 pg (ref 26.0–32.0)
MCHC: 35.3 g/dL (ref 31.0–35.5)
MCV: 85.1 fL (ref 78.0–100.0)
MONOCYTE #: 0.3 10*3/uL (ref 0.20–1.10)
MONOCYTE %: 3.9 %
MPV: 9.4 fL (ref 8.7–12.5)
NEUTROPHIL #: 6.65 10*3/uL (ref 1.50–7.70)
NEUTROPHIL %: 85.4 %
PLATELETS: 164 10*3/uL (ref 150–400)
RBC: 4.83 10*6/uL (ref 4.50–6.10)
RDW-CV: 13.3 % (ref 11.5–15.5)
WBC: 7.8 10*3/uL (ref 3.7–11.0)

## 2023-10-08 LAB — EXTENDED RESPIRATORY VIRUS PANEL
ADENOVIRUS ARRAY: NOT DETECTED
BORDETELLA PARAPERTUSSIS (IS 1001): NOT DETECTED
BORDETELLA PERTUSSIS ARRAY: NOT DETECTED
CHLAMYDOPHILA PNEUMONIAE ARRAY: NOT DETECTED
CORONAVIRUS 229E: NOT DETECTED
CORONAVIRUS HKU1: NOT DETECTED
CORONAVIRUS NL63: NOT DETECTED
CORONAVIRUS OC43: NOT DETECTED
INFLUENZA A: NOT DETECTED
INFLUENZA B ARRAY: NOT DETECTED
METAPNEUMOVIRUS ARRAY: DETECTED — AB
MYCOPLASMA PNEUMONIAE ARRAY: NOT DETECTED
PARAINFLUENZA 1 ARRAY: NOT DETECTED
PARAINFLUENZA 2 ARRAY: NOT DETECTED
PARAINFLUENZA 3 ARRAY: NOT DETECTED
PARAINFLUENZA 4 ARRAY: NOT DETECTED
RHINOVIRUS/ENTEROVIRUS ARRAY: NOT DETECTED
RSV ARRAY: NOT DETECTED
SARS CORONAVIRUS 2 (SARS-CoV-2): NOT DETECTED

## 2023-10-08 LAB — THYROID STIMULATING HORMONE (SENSITIVE TSH): TSH: 0.376 u[IU]/mL — ABNORMAL LOW (ref 0.490–4.670)

## 2023-10-08 LAB — HGA1C (HEMOGLOBIN A1C WITH EST AVG GLUCOSE)
ESTIMATED AVERAGE GLUCOSE: 186 mg/dL
HEMOGLOBIN A1C: 8.1 %

## 2023-10-08 LAB — GOLD TOP TUBE

## 2023-10-08 LAB — PHOSPHORUS: PHOSPHORUS: 3 mg/dL (ref 2.3–4.0)

## 2023-10-08 MED ORDER — DEXTROMETHORPHAN-GUAIFENESIN 10 MG-100 MG/5 ML ORAL SYRUP
ORAL_SOLUTION | ORAL | Status: AC
Start: 2023-10-08 — End: 2023-10-08
  Administered 2023-10-08: 10 mL via ORAL
  Filled 2023-10-08: qty 10

## 2023-10-08 MED ORDER — FLUTICASONE PROPIONATE 50 MCG/ACTUATION NASAL SPRAY,SUSPENSION
2.0000 | Freq: Every day | NASAL | Status: DC
Start: 2023-10-08 — End: 2023-10-12
  Administered 2023-10-09 – 2023-10-12 (×4): 2 via NASAL
  Filled 2023-10-08: qty 16

## 2023-10-08 MED ORDER — DEXTROMETHORPHAN-GUAIFENESIN 10 MG-100 MG/5 ML ORAL SYRUP
10.0000 mL | ORAL_SOLUTION | ORAL | Status: DC | PRN
Start: 2023-10-08 — End: 2023-10-12
  Administered 2023-10-08 – 2023-10-12 (×4): 10 mL via ORAL
  Filled 2023-10-08 (×4): qty 10

## 2023-10-08 MED ORDER — SODIUM CHLORIDE 0.65 % NASAL SPRAY AEROSOL
2.0000 | INHALATION_SPRAY | NASAL | Status: DC | PRN
Start: 2023-10-08 — End: 2023-10-12

## 2023-10-08 MED ORDER — SODIUM CHLORIDE 0.65 % NASAL SPRAY AEROSOL
INHALATION_SPRAY | NASAL | Status: AC
Start: 2023-10-08 — End: 2023-10-08
  Administered 2023-10-08: 2 via NASAL
  Filled 2023-10-08: qty 44

## 2023-10-08 MED ORDER — BENZONATATE 100 MG CAPSULE
100.0000 mg | ORAL_CAPSULE | Freq: Three times a day (TID) | ORAL | Status: DC | PRN
Start: 2023-10-08 — End: 2023-10-12
  Administered 2023-10-08 – 2023-10-09 (×3): 100 mg via ORAL
  Filled 2023-10-08 (×3): qty 1

## 2023-10-08 MED ORDER — POLYETHYLENE GLYCOL 3350 17 GRAM ORAL POWDER PACKET
17.0000 g | Freq: Every day | ORAL | Status: DC | PRN
Start: 2023-10-08 — End: 2023-10-12

## 2023-10-08 MED ORDER — FLUTICASONE PROPIONATE 50 MCG/ACTUATION NASAL SPRAY,SUSPENSION
NASAL | Status: AC
Start: 2023-10-08 — End: 2023-10-08
  Administered 2023-10-08: 2 via NASAL
  Filled 2023-10-08: qty 16

## 2023-10-08 NOTE — Nurses Notes (Signed)
 Pt admitted to room 56 from ER. Pt oriented to care setting and meds given. Pt rested without complaint. Safety measures in place and call bell within reach.

## 2023-10-08 NOTE — Care Plan (Deleted)
 Discussion had with patient and daughter at bedside regarding ambulation difficulties. Pt and daughter agree that SNF placement for rehab is a good plan Assisted patient to bedside commode and to bed from chair with assist x3 and walker with difficulty. During rest of shift patient rested quietly w/o complaint. Pt had safety measures in place and call bell within reach.

## 2023-10-08 NOTE — Nurses Notes (Signed)
 Uneventful day, states feeling better this afternoon. Sat in recliner for lunch and ambulating to bathroom, tolerating well. Cough improved with prn cough meds, see emar. Tele SR, VSS. O2 2L NC on.

## 2023-10-08 NOTE — Progress Notes (Signed)
 [facility]  Medicine Progress Note  FULL CODE: ATTEMPT RESUSCITATION/CPR    Reeves Dasie Lauth  Date of service: 10/08/2023    Subjective: Edwin [name] is a 68 y.o. male with past medical history of CAD (5 stents), T2DM, GERD, HTN, TIA  who presented to [facility] 6/21 with shortness of breath. VS on arrival showed HR in the 150s and febrile to 103. Labs with lactate 3.4. CT of the chest performed after negative CXR with LUL pneumonia. Biofire with metapneumovirus. Admited to SCU for further treatment and monitoring of Sepsis 2/2 Pneumonia.  Clinically improved today. Desaturations overnight.     Vital Signs:  Temp (24hrs) Max:39.5 C (103.1 F)      Systolic (24hrs), Avg:123 , Min:106 , Max:158     Diastolic (24hrs), Avg:69, Min:55, Max:108    Temp  Avg: 37.4 C (99.4 F)  Min: 35.7 C (96.3 F)  Max: 39.5 C (103.1 F)  MAP (Non-Invasive)  Avg: 83.9 mmHG  Min: 73 mmHG  Max: 115 mmHG  Pulse  Avg: 118.8  Min: 70  Max: 151  Resp  Avg: 23.4  Min: 18  Max: 33  SpO2  Avg: 97.2 %  Min: 94 %  Max: 100 %     Fi02    I/O:  I/O last 24 hours:    Intake/Output Summary (Last 24 hours) at 10/08/2023 0812  Last data filed at 10/08/2023 0800  Gross per 24 hour   Intake 2614.9 ml   Output --   Net 2614.9 ml     I/O current shift:  06/22 0700 - 06/22 1859  In: 480 [P.O.:480]  Out: -   Blood Sugars: Point of Care Glucose Last 24 Hr Results:  No results for input(s): GLUCOSEPOC in the last 24 hours.    acetaminophen  (TYLENOL ) tablet, 650 mg, Oral, Q4H PRN  aluminum-magnesium hydroxide-simethicone (MAG-AL PLUS) 200-200-20 mg per 5 mL oral liquid, 30 mL, Oral, Q4H PRN  aspirin  (ECOTRIN) enteric coated tablet 81 mg, 81 mg, Oral, Daily  atorvastatin  (LIPITOR) tablet, 40 mg, Oral, QPM  clopidogrel  (PLAVIX ) 75 mg tablet, 75 mg, Oral, Daily  D10W bolus infusion 250 mL, 250 mL, Intravenous, Q1H PRN  dapagliflozin  (FARXIGA ) tablet, 10 mg, Oral, Daily  dextrose  (GLUTOSE) 40% oral gel, 15 g, Oral, Q15 Min  PRN  diphenoxylate-atropine (LOMOTIL) 2.5-0.025 mg per tablet, 1 Tablet, Oral, 4x/day PRN  doxycycline  tablet, 100 mg, Oral, 2x/day  enoxaparin  PF (LOVENOX ) 40 mg/0.4 mL SubQ injection, 40 mg, Subcutaneous, Daily  [START ON 10/10/2023] ergocalciferol-vitamin D2 (DRISDOL) capsule, 50,000 Units, Oral, Q7 Days  ezetimibe (ZETIA) tablet, 10 mg, Oral, QPM  furosemide  (LASIX ) tablet, 20 mg, Oral, Daily  glucagon  (GLUCAGEN) injection 1 mg, 1 mg, IntraMUSCULAR, Once PRN  icosapent ethyl (VASCEPA) capsule, 2 g, Oral, 2x/day  ipratropium (ATROVENT) 0.02% nebulizer solution, 0.5 mg, Nebulization, Q6H  isosorbide  mononitrate (IMDUR ) 24 hr extended release tablet, 30 mg, Oral, Daily  levalbuterol (XOPENEX) 0.63 mg/ 3 mL nebulizer solution, 0.63 mg, Nebulization, Q6H PRN  levalbuterol (XOPENEX) 0.63 mg/ 3 mL nebulizer solution, 0.63 mg, Nebulization, Q6H  lidocaine  4% patch, 2 Patch, Transdermal, Daily  melatonin tablet, 3 mg, Oral, HS PRN  meropenem (MERREM) 1 g in SW 20 mL IV injection, 1 g, Intravenous, Q8H  methylPREDNISolone sod succ (SOLU-medrol) 40 mg/mL injection, 20 mg, Intravenous, Q8H  metoprolol  tartrate (LOPRESSOR ) tablet, 100 mg, Oral, 2x/day  NS 250 mL flush bag, , Intravenous, Q15 Min PRN  NS flush syringe, 1 mL, Intracatheter, Q1 MIN  PRN  NS flush syringe, 10 mL, Intracatheter, Q8HRS  NS flush syringe, 10 mL, Intracatheter, Q1H PRN  ondansetron  (ZOFRAN ) 2 mg/mL injection, 4 mg, Intravenous, Q6H PRN  pantoprazole  (PROTONIX ) delayed release tablet, 40 mg, Oral, Daily  polyethylene glycol (MIRALAX ) oral packet, 17 g, Oral, Daily  ranolazine  (RANEXA ) extended release tablet, 500 mg, Oral, 2x/day  rOPINIRole  (REQUIP ) tablet, 0.5 mg, Oral, 3x/day  sennosides-docusate sodium (SENOKOT-S) 8.6-50mg  per tablet, 1 Tablet, Oral, 2x/day PRN  sertraline (ZOLOFT) tablet, 50 mg, Oral, Daily        Allergies[1]    Physical Exam:  Constitutional:  acutely ill  ENT:  Head atraumatic and normocephalic  Neck:  no thyromegaly or  lymphadenopathy  Respiratory:  2L NC in place. Rhonchi in left upper lobe. No wheezing or crackles.   Cardiovascular:     Tachycardic to 130s, regular.   Gastrointestinal:  non-distended, Soft, non-tender, Bowel sounds normal  Genitourinary:  Deferred  Musculoskeletal:  Head atraumatic and normocephalic  Integumentary:  Skin warm and dry  Neurologic:  Grossly normal. Alert and oriented x3  Psychiatric:  Normal    Labs:  Lab Results Today:    Results for orders placed or performed during the hospital encounter of 10/07/23 (from the past 24 hours)   BASIC METABOLIC PANEL   Result Value Ref Range    SODIUM 137 136 - 145 mmol/L    POTASSIUM 4.7 3.5 - 5.1 mmol/L    CHLORIDE 104 96 - 111 mmol/L    CO2 TOTAL 27 23 - 31 mmol/L    ANION GAP 6 4 - 13 mmol/L    CALCIUM 8.5 (L) 8.6 - 10.3 mg/dL    GLUCOSE 833 (H) 65 - 125 mg/dL    BUN 23 8 - 25 mg/dL    CREATININE 8.99 9.24 - 1.35 mg/dL    BUN/CREA RATIO 23 (H) 6 - 22    ESTIMATED GFR - MALE 82 >=60 mL/min/BSA   MAGNESIUM   Result Value Ref Range    MAGNESIUM 2.1 1.8 - 2.6 mg/dL   CBC WITH DIFF   Result Value Ref Range    WBC 8.0 3.7 - 11.0 x10^3/uL    RBC 4.70 4.50 - 6.10 x10^6/uL    HGB 14.3 13.4 - 17.5 g/dL    HCT 60.0 61.0 - 47.9 %    MCV 84.9 78.0 - 100.0 fL    MCH 30.4 26.0 - 32.0 pg    MCHC 35.8 (H) 31.0 - 35.5 g/dL    RDW-CV 87.0 88.4 - 84.4 %    PLATELETS 171 150 - 400 x10^3/uL    MPV 9.4 8.7 - 12.5 fL    NEUTROPHIL % 81.9 %    LYMPHOCYTE % 9.7 %    MONOCYTE % 6.9 %    EOSINOPHIL % 0.0 %    BASOPHIL % 0.1 %    NEUTROPHIL # 6.56 1.50 - 7.70 x10^3/uL    LYMPHOCYTE # 0.78 (L) 1.00 - 4.80 x10^3/uL    MONOCYTE # 0.55 0.20 - 1.10 x10^3/uL    EOSINOPHIL # <0.04 <=0.50 x10^3/uL    BASOPHIL # <0.04 <=0.20 x10^3/uL    IMMATURE GRANULOCYTE % 1.4 (H) 0.0 - 1.0 %    IMMATURE GRANULOCYTE # 0.11 (H) <0.10 x10^3/uL       Radiology:  CXR with NAP    Microbiology:  Biofire with metapneumovirus     PT/OT: Yes    Consults: PT OT Social     Hardware (lines, foley's, tubes):  PIV  Assessment/ Plan:   Active Hospital Problems    Diagnosis    Primary Problem: Sepsis due to pneumonia (CMS HCC)     Ralston Venus is a 68 y.o. male with past medical history of CAD (5 stents), T2DM, GERD, HTN, TIA  who presented to [facility] ER 6/21 with shortness of breath diagnosed with endobronchial pneumonia.      Sepsis  Acute Hypoxic Respiratory Failure  Endobronchial Pneumonia  - Patient meeting sepsis criteria on admission - HR 150s, temp 103. Started on Rocephin, Azithro. Broadened to Merrem, Doxy. Add steroids, nebs. Monitor in SCU. Sepsis resolved. Clinically improved.      CAD - Known CAD with 5 stents in place. Continue extensive cardiac regimen from home. ASA, Lipitor, Plavix , Farxiga , Zetia, Vascepa, Imdur , Lopressor   GERD- Continue Protonix   Mood Disorder- Continue Zoloft  RLS- Continue Requip        DVT/PE Prophylaxis: Enoxaparin     Disposition Planning: Home discharge      Verdon Karma, DO  Hospitalist, [facility]            [1]   Allergies  Allergen Reactions    Gabapentin  Other Adverse Reaction (Add comment)     hallucinations      Morphine  Other Adverse Reaction (Add comment)     Hallucinations      Tylenol  Pm [Diphenhydramine-Acetaminophen ]      Patient denied      Keflex [Cephalexin] Nausea/ Vomiting    Victoza [Liraglutide] Nausea/ Vomiting

## 2023-10-08 NOTE — Care Plan (Signed)
 Problem: Adult Inpatient Plan of Care  Goal: Plan of Care Review  Outcome: Ongoing (see interventions/notes)  Goal: Patient-Specific Goal (Individualized)  Outcome: Ongoing (see interventions/notes)  Goal: Absence of Hospital-Acquired Illness or Injury  Outcome: Ongoing (see interventions/notes)  Intervention: Identify and Manage Fall Risk  Recent Flowsheet Documentation  Taken 10/08/2023 0900 by Eleanor DASEN, RN  Safety Promotion/Fall Prevention:   activity supervised   fall prevention program maintained   muscle strengthening facilitated   nonskid shoes/slippers when out of bed   safety round/check completed   toileting scheduled  Intervention: Prevent and Manage VTE (Venous Thromboembolism) Risk  Recent Flowsheet Documentation  Taken 10/08/2023 0900 by Eleanor DASEN, RN  VTE Prevention/Management:   ambulation promoted   anticoagulant therapy intiated  Goal: Optimal Comfort and Wellbeing  Outcome: Ongoing (see interventions/notes)  Goal: Rounds/Family Conference  Outcome: Ongoing (see interventions/notes)     Problem: Gas Exchange Impaired  Goal: Optimal Gas Exchange  Outcome: Ongoing (see interventions/notes)     Problem: Chest Pain  Goal: Resolution of Chest Pain Symptoms  Outcome: Ongoing (see interventions/notes)     Problem: Fall Injury Risk  Goal: Absence of Fall and Fall-Related Injury  Outcome: Ongoing (see interventions/notes)  Intervention: Identify and Manage Contributors  Recent Flowsheet Documentation  Taken 10/08/2023 0900 by Eleanor DASEN, RN  Medication Review/Management: medications reviewed  Intervention: Promote Injury-Free Environment  Recent Flowsheet Documentation  Taken 10/08/2023 0900 by Eleanor DASEN, RN  Safety Promotion/Fall Prevention:   activity supervised   fall prevention program maintained   muscle strengthening facilitated   nonskid shoes/slippers when out of bed   safety round/check completed   toileting scheduled

## 2023-10-08 NOTE — Care Plan (Signed)
 Problem: Adult Inpatient Plan of Care  Goal: Plan of Care Review  10/08/2023 0051 by Ronal DEL, RN  Outcome: Ongoing (see interventions/notes)  10/08/2023 0049 by Ronal DEL, RN  Outcome: Ongoing (see interventions/notes)  Goal: Patient-Specific Goal (Individualized)  10/08/2023 0051 by Ronal DEL, RN  Outcome: Ongoing (see interventions/notes)  10/08/2023 0049 by Ronal DEL, RN  Outcome: Ongoing (see interventions/notes)  Flowsheets (Taken 10/07/2023 2321)  Individualized Care Needs: Insulin  Pump  Anxieties, Fears or Concerns: Concerned with PNA admission  Goal: Absence of Hospital-Acquired Illness or Injury  10/08/2023 0051 by Ronal DEL, RN  Outcome: Ongoing (see interventions/notes)  10/08/2023 0049 by Ronal DEL, RN  Outcome: Ongoing (see interventions/notes)  Goal: Optimal Comfort and Wellbeing  10/08/2023 0051 by Ronal DEL, RN  Outcome: Ongoing (see interventions/notes)  10/08/2023 0049 by Ronal DEL, RN  Outcome: Ongoing (see interventions/notes)  Goal: Rounds/Family Conference  10/08/2023 0051 by Ronal DEL, RN  Outcome: Ongoing (see interventions/notes)  10/08/2023 0049 by Ronal DEL, RN  Outcome: Ongoing (see interventions/notes)     Problem: Gas Exchange Impaired  Goal: Optimal Gas Exchange  10/08/2023 0051 by Ronal DEL, RN  Outcome: Ongoing (see interventions/notes)  10/08/2023 0049 by Ronal DEL, RN  Outcome: Ongoing (see interventions/notes)     Problem: Chest Pain  Goal: Resolution of Chest Pain Symptoms  10/08/2023 0051 by Ronal DEL, RN  Outcome: Ongoing (see interventions/notes)  10/08/2023 0049 by Ronal DEL, RN  Outcome: Ongoing (see interventions/notes)

## 2023-10-09 ENCOUNTER — Observation Stay (HOSPITAL_COMMUNITY)

## 2023-10-09 LAB — BASIC METABOLIC PANEL
ANION GAP: 6 mmol/L (ref 4–13)
BUN/CREA RATIO: 23 — ABNORMAL HIGH (ref 6–22)
BUN: 23 mg/dL (ref 8–25)
CALCIUM: 8.5 mg/dL — ABNORMAL LOW (ref 8.6–10.3)
CHLORIDE: 104 mmol/L (ref 96–111)
CO2 TOTAL: 27 mmol/L (ref 23–31)
CREATININE: 1 mg/dL (ref 0.75–1.35)
ESTIMATED GFR - MALE: 82 mL/min/BSA (ref >=60)
GLUCOSE: 166 mg/dL — ABNORMAL HIGH (ref 65–125)
POTASSIUM: 4.7 mmol/L (ref 3.5–5.1)
SODIUM: 137 mmol/L (ref 136–145)

## 2023-10-09 LAB — CBC WITH DIFF
BASOPHIL #: <0.04 10*3/uL (ref <=0.20)
BASOPHIL %: 0.1 %
EOSINOPHIL #: <0.04 10*3/uL (ref <=0.50)
EOSINOPHIL %: 0 %
HCT: 39.9 % (ref 38.9–52.0)
HGB: 14.3 g/dL (ref 13.4–17.5)
IMMATURE GRANULOCYTE #: 0.11 10*3/uL — ABNORMAL HIGH (ref <0.10)
IMMATURE GRANULOCYTE %: 1.4 % — ABNORMAL HIGH (ref 0.0–1.0)
LYMPHOCYTE #: 0.78 10*3/uL — ABNORMAL LOW (ref 1.00–4.80)
LYMPHOCYTE %: 9.7 %
MCH: 30.4 pg (ref 26.0–32.0)
MCHC: 35.8 g/dL — ABNORMAL HIGH (ref 31.0–35.5)
MCV: 84.9 fL (ref 78.0–100.0)
MONOCYTE #: 0.55 10*3/uL (ref 0.20–1.10)
MONOCYTE %: 6.9 %
MPV: 9.4 fL (ref 8.7–12.5)
NEUTROPHIL #: 6.56 10*3/uL (ref 1.50–7.70)
NEUTROPHIL %: 81.9 %
PLATELETS: 171 10*3/uL (ref 150–400)
RBC: 4.7 10*6/uL (ref 4.50–6.10)
RDW-CV: 12.9 % (ref 11.5–15.5)
WBC: 8 10*3/uL (ref 3.7–11.0)

## 2023-10-09 LAB — MAGNESIUM: MAGNESIUM: 2.1 mg/dL (ref 1.8–2.6)

## 2023-10-09 MED ORDER — SODIUM CHLORIDE 0.9 % INJECTION SOLUTION
1.0000 g | INTRAMUSCULAR | Status: DC
Start: 2023-10-09 — End: 2023-10-12
  Administered 2023-10-09 – 2023-10-12 (×4): 1 g via INTRAVENOUS
  Filled 2023-10-09 (×6): qty 10

## 2023-10-09 NOTE — Care Management Notes (Signed)
 [name]  Care Management Initial Evaluation    Patient Name: Edwin [name]  Date of Birth: 1955-08-28  Sex: male  Date/Time of Admission: 10/07/2023  7:16 PM  Room/Bed: 56/A  Payor: [place] MEDICARE / Plan: [place] MEDICARE ADVANTAGE PPO / Product Type: PPO /   Primary Care Providers:  Edwin Jester, MD, MD (General)    Pharmacy Info:   Preferred Pharmacy       [facility] [place], [place] - [address]    [address] [place] [address]    Phone: [phone] Fax: [phone]    Hours: Not open 24 hours    [id number] - [place], [place] - [address] AT [facility] [address]    [address] [place] [address]    Phone: [phone] Fax: [phone]    Hours: Not open 24 hours    [facility] [address], [place] - [address]    [address] [place] [address]    Phone: [phone] Fax: [phone]    Hours: Not open 24 hours          Emergency Contact Info:   Extended Emergency Contact Information  Primary Emergency Contact: Edwin, [name], [place] [address] [place]  Mobile Phone: [phone]  Relation: Brother  Preferred language: English    History:   Edwin [name] is a 68 y.o., male, admitted under observation services on 10/07/23.    Height/Weight: 172.7 cm (5' 8) / 97.1 kg (214 lb)     LOS: 0 days   Admitting Diagnosis: Sepsis due to pneumonia (CMS HCC) [J18.9, A41.9]    Assessment:      10/09/23 1700   Assessment Detail   Assessment Type Admission   Social Work Plan   Discharge Planning Status initial meeting   Anticipated Discharge Disposition Home   Plan   (patient will discharge when medically ready)   Patient/Family in Agreement with Plan yes   CM to Do   (reach out to financial counselor as patient reported some concerns regarding his medical bills)   PAST SERVICES   Have you had any in-home services in the past? No   Have you had Home Health Services in the past? No    Have you been to a Skilled Nursing Facility in the past? No   Discharge Needs Assessment   Concerns To Be Addressed financial/insurance concerns   Concerns Comments   (patient stated that he lost Medicaid due to his Social Security increase. He is interested in applying for [facility] but knows he needs to apply for Medicaid and get a denial letter first.)   Readmission Within the Last 30 Days no previous admission in last 30 days   Equipment Currently Used at Home other (see comments)  (walking stick and canes)   Equipment Needed After Discharge other (see comments)  (TBD)   Transportation Available family or friend will provide   Referral Information   Admission Type observation   Arrived From home or self-care   Address Verified verified-no changes   Insurance Verified verified-no change   Source of Information Patient   Referral Source physician   Reason for Consult discharge planning   Observation Form   Observation Form Given MOON form given   MOON form explained/reviewed with:  Patient   MOON form date of delivery 10/09/23  MOON form time of delivery 1700   ADVANCE DIRECTIVES   Does the Patient have an Advance Directive? No, Information Offered and Given   Patient Requests Assistance in Having Advance Directive Notarized. Yes  (patient interested in completing MPOA. 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)   Source of Income Optician, dispensing Name   Control and instrumentation engineer Street)        Conservation officer, nature   ([facility])        Pharmacy Phone Number   [phone])   Living Environment   Lives With alone   Living Arrangements apartment   Duration at Residence   (5 years)   Able to Return to Prior Arrangements yes   Functional Status Prior   Ambulation 0 - independent   Transferring 0 - independent   Toileting 0 - independent    Bathing 0 - independent   Dressing 0 - independent   Eating 0 - independent   Communication 0 - understands/communicates without difficulty   Swallowing 0 - swallows foods/liquids without difficulty   Functional Status, IADL   Medications independent   Meal Preparation independent   Housekeeping independent   Laundry independent   Shopping independent   MEDICATION MANAGEMENT   Do you manage your own medications Yes   Do you use a pill box? Yes   Usage of Pill Box   (daily)   Do you understand how to take all of your medications? Yes     SS received consult on patient. MSW met with patient. He reported that he is independent with all ADLs and reported that he still drives however his vehicle is currently in the shop. He voiced concerns regarding his medical bills. He stated that his SS check covers his bills but nothing additional. He knows that he will need to pay for his vehicle to be repaired once that part comes in and the shop is able to fix it. He stated that he has a few surgeries coming up including two carpal tunnel surgeries and surgeries on both of his elbows. MSW mentioned to him about speaking to the financial counselor. The patient is agreeable. MSW will reach out to [facility], Artist on the patient's behalf. He stated that he would reach out to [place] at the [facility] so that he can start applying for Medicaid so he can get the denial letter to turn into [place]. He stated that he had Medicaid but then no longer qualified due to an increase in his SS check. He said the increase was not a lot but he still lost his medicaid due to the increase. Patient stated that he has canes in his home and uses them as needed but also uses his walking stick for longer distances. 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     Discharge Plan:  Home (Patient/Family Member/other) (code 1)  Patient plans to discharge home when medically ready.     The patient will continue to be evaluated for  developing discharge needs.     Case Manager: Ulanda Null, SOCIAL WORKER  Phone: [phone] ext. [phone]

## 2023-10-09 NOTE — Care Plan (Addendum)
 Medications given without complication, patient rested throughout the day. Oxygen titrated to 2L after dropping to 86% on RA. Safety measures in place. Call bell in reach.     Problem: Adult Inpatient Plan of Care  Goal: Plan of Care Review  Outcome: Ongoing (see interventions/notes)  Goal: Patient-Specific Goal (Individualized)  Outcome: Ongoing (see interventions/notes)  Goal: Absence of Hospital-Acquired Illness or Injury  Outcome: Ongoing (see interventions/notes)  Intervention: Identify and Manage Fall Risk  Recent Flowsheet Documentation  Taken 10/09/2023 0706 by Syniah Berne C, RN  Safety Promotion/Fall Prevention:   activity supervised   fall prevention program maintained   nonskid shoes/slippers when out of bed   safety round/check completed  Intervention: Prevent Skin Injury  Recent Flowsheet Documentation  Taken 10/09/2023 0706 by Hayden BROCKS, RN  Skin Protection: adhesive use limited  Goal: Optimal Comfort and Wellbeing  Outcome: Ongoing (see interventions/notes)  Goal: Rounds/Family Conference  Outcome: Ongoing (see interventions/notes)     Problem: Gas Exchange Impaired  Goal: Optimal Gas Exchange  Outcome: Ongoing (see interventions/notes)     Problem: Chest Pain  Goal: Resolution of Chest Pain Symptoms  Outcome: Ongoing (see interventions/notes)     Problem: Fall Injury Risk  Goal: Absence of Fall and Fall-Related Injury  Outcome: Ongoing (see interventions/notes)  Intervention: Identify and Manage Contributors  Recent Flowsheet Documentation  Taken 10/09/2023 0706 by Brettney Ficken C, RN  Medication Review/Management: medications reviewed  Intervention: Promote Injury-Free Environment  Recent Flowsheet Documentation  Taken 10/09/2023 0706 by Christia Domke C, RN  Safety Promotion/Fall Prevention:   activity supervised   fall prevention program maintained   nonskid shoes/slippers when out of bed   safety round/check completed

## 2023-10-09 NOTE — Progress Notes (Addendum)
 [facility]  Medicine Progress Note  FULL CODE: ATTEMPT RESUSCITATION/CPR    Edwin [name]  Date of service: 10/09/2023    Subjective: Edwin [name] is a 68 y.o. male with past medical history of CAD (5 stents), T2DM, GERD, HTN, TIA  who presented to [facility] ER 6/21 with shortness of breath. VS on arrival showed HR in the 150s and febrile to 103. Labs with lactate 3.4. CT of the chest performed after negative CXR with LUL pneumonia. Biofire with metapneumovirus. Admited to SCU for further treatment and monitoring of Sepsis 2/2 Pneumonia.  Continues to report subjective improvement. Deescalate antibiotics and steroids.       Vital Signs:  Temp (24hrs) Max:36.9 C (98.4 F)      Systolic (24hrs), Avg:125 , Min:109 , Max:141     Diastolic (24hrs), Avg:72, Min:63, Max:84    Temp  Avg: 36.4 C (97.5 F)  Min: 35.9 C (96.7 F)  Max: 36.9 C (98.4 F)  MAP (Non-Invasive)  Avg: 87.8 mmHG  Min: 77 mmHG  Max: 100 mmHG  Pulse  Avg: 88.9  Min: 75  Max: 99  Resp  Avg: 22.3  Min: 14  Max: 28  SpO2  Avg: 93.4 %  Min: 90 %  Max: 97 %     Fi02    I/O:  I/O last 24 hours:    Intake/Output Summary (Last 24 hours) at 10/09/2023 1334  Last data filed at 10/09/2023 1233  Gross per 24 hour   Intake 1940 ml   Output 400 ml   Net 1540 ml     I/O current shift:  06/23 0700 - 06/23 1859  In: 720 [P.O.:720]  Out: 400 [Urine:400]  Blood Sugars: Point of Care Glucose Last 24 Hr Results:  No results for input(s): GLUCOSEPOC in the last 24 hours.    acetaminophen  (TYLENOL ) tablet, 650 mg, Oral, Q4H PRN  aluminum-magnesium hydroxide-simethicone (MAG-AL PLUS) 200-200-20 mg per 5 mL oral liquid, 30 mL, Oral, Q4H PRN  aspirin  (ECOTRIN) enteric coated tablet 81 mg, 81 mg, Oral, Daily  atorvastatin  (LIPITOR) tablet, 40 mg, Oral, QPM  benzonatate (TESSALON) capsule, 100 mg, Oral, Q8H PRN  cefTRIAXone (ROCEPHIN) 1 g in NS 10 mL IV injection, 1 g, Intravenous, Q24H  clopidogrel  (PLAVIX ) 75 mg tablet, 75 mg, Oral, Daily  D10W bolus  infusion 250 mL, 250 mL, Intravenous, Q1H PRN  dapagliflozin  (FARXIGA ) tablet, 10 mg, Oral, Daily  dextromethorphan-guaiFENesin (ROBITUSSIN DM) 10-100mg  per 5mL oral liquid, 10 mL, Oral, Q4H PRN  dextrose  (GLUTOSE) 40% oral gel, 15 g, Oral, Q15 Min PRN  diphenoxylate-atropine (LOMOTIL) 2.5-0.025 mg per tablet, 1 Tablet, Oral, 4x/day PRN  doxycycline  tablet, 100 mg, Oral, 2x/day  enoxaparin  PF (LOVENOX ) 40 mg/0.4 mL SubQ injection, 40 mg, Subcutaneous, Daily  [START ON 10/10/2023] ergocalciferol-vitamin D2 (DRISDOL) capsule, 50,000 Units, Oral, Q7 Days  ezetimibe (ZETIA) tablet, 10 mg, Oral, QPM  fluticasone  (FLONASE ) 50 mcg per spray nasal spray, 2 Spray, Each Nostril, Daily  furosemide  (LASIX ) tablet, 20 mg, Oral, Daily  glucagon  (GLUCAGEN) injection 1 mg, 1 mg, IntraMUSCULAR, Once PRN  icosapent ethyl (VASCEPA) capsule, 2 g, Oral, 2x/day  ipratropium (ATROVENT) 0.02% nebulizer solution, 0.5 mg, Nebulization, Q6H  isosorbide  mononitrate (IMDUR ) 24 hr extended release tablet, 30 mg, Oral, Daily  levalbuterol (XOPENEX) 0.63 mg/ 3 mL nebulizer solution, 0.63 mg, Nebulization, Q6H PRN  levalbuterol (XOPENEX) 0.63 mg/ 3 mL nebulizer solution, 0.63 mg, Nebulization, Q6H  lidocaine  4% patch, 2 Patch, Transdermal, Daily  melatonin tablet, 3 mg,  Oral, HS PRN  methylPREDNISolone sod succ (SOLU-medrol) 40 mg/mL injection, 20 mg, Intravenous, Q8H  metoprolol  tartrate (LOPRESSOR ) tablet, 100 mg, Oral, 2x/day  NS 250 mL flush bag, , Intravenous, Q15 Min PRN  NS flush syringe, 1 mL, Intracatheter, Q1 MIN PRN  NS flush syringe, 10 mL, Intracatheter, Q8HRS  NS flush syringe, 10 mL, Intracatheter, Q1H PRN  ondansetron  (ZOFRAN ) 2 mg/mL injection, 4 mg, Intravenous, Q6H PRN  pantoprazole  (PROTONIX ) delayed release tablet, 40 mg, Oral, Daily  polyethylene glycol (MIRALAX ) oral packet, 17 g, Oral, Daily PRN  ranolazine  (RANEXA ) extended release tablet, 500 mg, Oral, 2x/day  rOPINIRole  (REQUIP ) tablet, 0.5 mg, Oral,  3x/day  sennosides-docusate sodium (SENOKOT-S) 8.6-50mg  per tablet, 1 Tablet, Oral, 2x/day PRN  sertraline (ZOLOFT) tablet, 50 mg, Oral, Daily  sodium chloride  (OCEAN) 0.65% nasal spray, 2 Spray, Each Nostril, Q4H PRN        Allergies[1]    Physical Exam:  Constitutional:  acutely ill  ENT:  Head atraumatic and normocephalic  Neck:  no thyromegaly or lymphadenopathy  Respiratory:  2L NC in place. Rhonchi in left upper lobe. No wheezing or crackles.   Cardiovascular:     Tachycardic to 130s, regular.   Gastrointestinal:  non-distended, Soft, non-tender, Bowel sounds normal  Genitourinary:  Deferred  Musculoskeletal:  Head atraumatic and normocephalic  Integumentary:  Skin warm and dry  Neurologic:  Grossly normal. Alert and oriented x3  Psychiatric:  Normal      Labs:  Lab Results Today:    Results for orders placed or performed during the hospital encounter of 10/07/23 (from the past 24 hours)   BASIC METABOLIC PANEL   Result Value Ref Range    SODIUM 137 136 - 145 mmol/L    POTASSIUM 4.7 3.5 - 5.1 mmol/L    CHLORIDE 104 96 - 111 mmol/L    CO2 TOTAL 27 23 - 31 mmol/L    ANION GAP 6 4 - 13 mmol/L    CALCIUM 8.5 (L) 8.6 - 10.3 mg/dL    GLUCOSE 833 (H) 65 - 125 mg/dL    BUN 23 8 - 25 mg/dL    CREATININE 8.99 9.24 - 1.35 mg/dL    BUN/CREA RATIO 23 (H) 6 - 22    ESTIMATED GFR - MALE 82 >=60 mL/min/BSA   MAGNESIUM   Result Value Ref Range    MAGNESIUM 2.1 1.8 - 2.6 mg/dL   CBC WITH DIFF   Result Value Ref Range    WBC 8.0 3.7 - 11.0 x10^3/uL    RBC 4.70 4.50 - 6.10 x10^6/uL    HGB 14.3 13.4 - 17.5 g/dL    HCT 60.0 61.0 - 47.9 %    MCV 84.9 78.0 - 100.0 fL    MCH 30.4 26.0 - 32.0 pg    MCHC 35.8 (H) 31.0 - 35.5 g/dL    RDW-CV 87.0 88.4 - 84.4 %    PLATELETS 171 150 - 400 x10^3/uL    MPV 9.4 8.7 - 12.5 fL    NEUTROPHIL % 81.9 %    LYMPHOCYTE % 9.7 %    MONOCYTE % 6.9 %    EOSINOPHIL % 0.0 %    BASOPHIL % 0.1 %    NEUTROPHIL # 6.56 1.50 - 7.70 x10^3/uL    LYMPHOCYTE # 0.78 (L) 1.00 - 4.80 x10^3/uL    MONOCYTE # 0.55 0.20 -  1.10 x10^3/uL    EOSINOPHIL # <0.04 <=0.50 x10^3/uL    BASOPHIL # <0.04 <=0.20 x10^3/uL    IMMATURE GRANULOCYTE %  1.4 (H) 0.0 - 1.0 %    IMMATURE GRANULOCYTE # 0.11 (H) <0.10 x10^3/uL       Radiology:  CXR with NAP    Microbiology:  Biofire with metapneumovirus     PT/OT: Yes    Consults: PT OT Social     Hardware (lines, foley's, tubes): PIV    Assessment/ Plan:   Active Hospital Problems    Diagnosis    Primary Problem: Sepsis due to pneumonia (CMS HCC)     Vasco Chong is a 68 y.o. male with past medical history of CAD (5 stents), T2DM, GERD, HTN, TIA  who presented to [facility] ER 6/21 with shortness of breath diagnosed with endobronchial pneumonia.      Sepsis  Acute Hypoxic Respiratory Failure  Endobronchial Pneumonia  - Patient meeting sepsis criteria on admission - HR 150s, temp 103. Started on Rocephin, Azithro. Broadened to Merrem, Doxy. Add steroids, nebs. Monitor in SCU. Sepsis resolved. Clinically improved. Deescalate antibiotics and steroids.      CAD - Known CAD with 5 stents in place. Continue extensive cardiac regimen from home. ASA, Lipitor, Plavix , Farxiga , Zetia, Vascepa, Imdur , Lopressor   Mixed Systolic and Diastolic CHF - Continue home diuretic. Patient requiring O2 despite improvements in PNA. Continue to closely monitor.   GERD- Continue Protonix   Mood Disorder- Continue Zoloft  RLS- Continue Requip        DVT/PE Prophylaxis: Enoxaparin     Disposition Planning: Home discharge      Verdon Karma, DO  Hospitalist, [facility]            [1]   Allergies  Allergen Reactions    Gabapentin  Other Adverse Reaction (Add comment)     hallucinations      Morphine  Other Adverse Reaction (Add comment)     Hallucinations      Tylenol  Pm [Diphenhydramine-Acetaminophen ]      Patient denied      Keflex [Cephalexin] Nausea/ Vomiting    Victoza [Liraglutide] Nausea/ Vomiting

## 2023-10-09 NOTE — Care Plan (Signed)
 Pt rested quietly with 3L oxygen via NC in place. VSS via tele. Pt denies nausea, sob, or pain.  Pt denies needs. Safety measures in place and call bell within reach.     Problem: Adult Inpatient Plan of Care  Goal: Plan of Care Review  Outcome: Ongoing (see interventions/notes)  Goal: Patient-Specific Goal (Individualized)  Outcome: Ongoing (see interventions/notes)  Goal: Absence of Hospital-Acquired Illness or Injury  Outcome: Ongoing (see interventions/notes)  Intervention: Identify and Manage Fall Risk  Recent Flowsheet Documentation  Taken 10/08/2023 2009 by Ronal DEL, RN  Safety Promotion/Fall Prevention:   activity supervised   fall prevention program maintained   nonskid shoes/slippers when out of bed   safety round/check completed  Intervention: Prevent Skin Injury  Recent Flowsheet Documentation  Taken 10/08/2023 2009 by Ronal DEL, RN  Body Position:   sitting   supine, head elevated  Skin Protection:   adhesive use limited   transparent dressing maintained   tubing/devices free from skin contact  Intervention: Prevent and Manage VTE (Venous Thromboembolism) Risk  Recent Flowsheet Documentation  Taken 10/08/2023 2009 by Ronal DEL, RN  VTE Prevention/Management:   ambulation promoted   anticoagulant therapy maintained  Intervention: Prevent Infection  Recent Flowsheet Documentation  Taken 10/08/2023 2009 by Ronal DEL, RN  Infection Prevention:   glycemic control managed   promote handwashing   rest/sleep promoted   single patient room provided  Goal: Optimal Comfort and Wellbeing  Outcome: Ongoing (see interventions/notes)  Intervention: Provide Person-Centered Care  Recent Flowsheet Documentation  Taken 10/08/2023 2009 by Ronal DEL, RN  Trust Relationship/Rapport:   care explained   choices provided   questions encouraged  Goal: Rounds/Family Conference  Outcome: Ongoing (see interventions/notes)     Problem: Gas Exchange Impaired  Goal: Optimal Gas Exchange  Outcome: Ongoing (see interventions/notes)  Intervention: Optimize  Oxygenation and Ventilation  Recent Flowsheet Documentation  Taken 10/08/2023 2009 by Ronal DEL, RN  Head of Bed [facility]) Positioning: HOB at 60-90 degrees     Problem: Chest Pain  Goal: Resolution of Chest Pain Symptoms  Outcome: Ongoing (see interventions/notes)     Problem: Fall Injury Risk  Goal: Absence of Fall and Fall-Related Injury  Outcome: Ongoing (see interventions/notes)  Intervention: Identify and Manage Contributors  Recent Flowsheet Documentation  Taken 10/08/2023 2009 by Ronal DEL, RN  Self-Care Promotion: independence encouraged  Medication Review/Management: medications reviewed  Intervention: Promote Injury-Free Environment  Recent Flowsheet Documentation  Taken 10/08/2023 2009 by Ronal DEL, RN  Safety Promotion/Fall Prevention:   activity supervised   fall prevention program maintained   nonskid shoes/slippers when out of bed   safety round/check completed

## 2023-10-09 NOTE — Nurses Notes (Signed)
 Oxygen removed from patient per physician and sleep study ordered for tonight to determine discharge needs. O2-  92% RA at this time with no difficulty breathing or SOB expressed

## 2023-10-10 ENCOUNTER — Other Ambulatory Visit: Payer: Self-pay

## 2023-10-10 ENCOUNTER — Observation Stay (HOSPITAL_COMMUNITY)

## 2023-10-10 DIAGNOSIS — R000 Tachycardia, unspecified: Secondary | ICD-10-CM

## 2023-10-10 LAB — BASIC METABOLIC PANEL
ANION GAP: 7 mmol/L (ref 4–13)
BUN/CREA RATIO: 27 — ABNORMAL HIGH (ref 6–22)
BUN: 24 mg/dL (ref 8–25)
CALCIUM: 8.6 mg/dL (ref 8.6–10.3)
CHLORIDE: 103 mmol/L (ref 96–111)
CO2 TOTAL: 26 mmol/L (ref 23–31)
CREATININE: 0.9 mg/dL (ref 0.75–1.35)
ESTIMATED GFR - MALE: >90 mL/min/BSA (ref >=60)
GLUCOSE: 125 mg/dL (ref 65–125)
POTASSIUM: 4.3 mmol/L (ref 3.5–5.1)
SODIUM: 136 mmol/L (ref 136–145)

## 2023-10-10 LAB — CBC WITH DIFF
BASOPHIL #: <0.04 10*3/uL (ref <=0.20)
BASOPHIL %: 0.1 %
EOSINOPHIL #: <0.04 10*3/uL (ref <=0.50)
EOSINOPHIL %: 0 %
HCT: 41.7 % (ref 38.9–52.0)
HGB: 14.8 g/dL (ref 13.4–17.5)
IMMATURE GRANULOCYTE #: 0.14 10*3/uL — ABNORMAL HIGH (ref <0.10)
IMMATURE GRANULOCYTE %: 1.6 % — ABNORMAL HIGH (ref 0.0–1.0)
LYMPHOCYTE #: 1.26 10*3/uL (ref 1.00–4.80)
LYMPHOCYTE %: 14.8 %
MCH: 30.1 pg (ref 26.0–32.0)
MCHC: 35.5 g/dL (ref 31.0–35.5)
MCV: 84.9 fL (ref 78.0–100.0)
MONOCYTE #: 0.52 10*3/uL (ref 0.20–1.10)
MONOCYTE %: 6.1 %
MPV: 9.2 fL (ref 8.7–12.5)
NEUTROPHIL #: 6.61 10*3/uL (ref 1.50–7.70)
NEUTROPHIL %: 77.4 %
PLATELETS: 176 10*3/uL (ref 150–400)
RBC: 4.91 10*6/uL (ref 4.50–6.10)
RDW-CV: 13.2 % (ref 11.5–15.5)
WBC: 8.5 10*3/uL (ref 3.7–11.0)

## 2023-10-10 LAB — MAGNESIUM: MAGNESIUM: 2.2 mg/dL (ref 1.8–2.6)

## 2023-10-10 NOTE — Care Plan (Signed)
 Pt cooperative and tolerated treatment well. Pt had an uneventful night. No concerns or complaints at this time. Safety measures maintained. Call bell within reach. Will continue to monitor pt.   Problem: Adult Inpatient Plan of Care  Goal: Plan of Care Review  Outcome: Ongoing (see interventions/notes)  Goal: Patient-Specific Goal (Individualized)  Outcome: Ongoing (see interventions/notes)  Goal: Absence of Hospital-Acquired Illness or Injury  Outcome: Ongoing (see interventions/notes)  Intervention: Identify and Manage Fall Risk  Recent Flowsheet Documentation  Taken 10/10/2023 0508 by Karna RAMAN, RN  Safety Promotion/Fall Prevention:   activity supervised   fall prevention program maintained   motion sensor pad activated   nonskid shoes/slippers when out of bed   safety round/check completed  Taken 10/10/2023 0300 by Karna RAMAN, RN  Safety Promotion/Fall Prevention:   activity supervised   fall prevention program maintained   motion sensor pad activated   nonskid shoes/slippers when out of bed   safety round/check completed  Taken 10/10/2023 0115 by Karna RAMAN, RN  Safety Promotion/Fall Prevention:   activity supervised   fall prevention program maintained   motion sensor pad activated   nonskid shoes/slippers when out of bed   safety round/check completed  Intervention: Prevent Infection  Recent Flowsheet Documentation  Taken 10/10/2023 0508 by Karna RAMAN, RN  Infection Prevention:   barrier precautions utilized   personal protective equipment utilized   promote handwashing   rest/sleep promoted   glycemic control managed  Taken 10/10/2023 0300 by Karna RAMAN, RN  Infection Prevention:   barrier precautions utilized   personal protective equipment utilized   promote handwashing   rest/sleep promoted  Taken 10/10/2023 0115 by Karna RAMAN, RN  Infection Prevention:   barrier precautions utilized   personal protective equipment utilized   promote handwashing   rest/sleep promoted   glycemic control managed  Goal: Optimal Comfort and  Wellbeing  Outcome: Ongoing (see interventions/notes)  Goal: Rounds/Family Conference  Outcome: Ongoing (see interventions/notes)     Problem: Gas Exchange Impaired  Goal: Optimal Gas Exchange  Outcome: Ongoing (see interventions/notes)     Problem: Chest Pain  Goal: Resolution of Chest Pain Symptoms  Outcome: Ongoing (see interventions/notes)     Problem: Fall Injury Risk  Goal: Absence of Fall and Fall-Related Injury  Outcome: Ongoing (see interventions/notes)  Intervention: Identify and Manage Contributors  Recent Flowsheet Documentation  Taken 10/10/2023 0508 by Karna RAMAN, RN  Medication Review/Management: medications reviewed  Taken 10/10/2023 0300 by Karna RAMAN, RN  Medication Review/Management: medications reviewed  Taken 10/10/2023 0115 by Karna RAMAN, RN  Medication Review/Management: medications reviewed  Intervention: Promote Injury-Free Environment  Recent Flowsheet Documentation  Taken 10/10/2023 0508 by Karna RAMAN, RN  Safety Promotion/Fall Prevention:   activity supervised   fall prevention program maintained   motion sensor pad activated   nonskid shoes/slippers when out of bed   safety round/check completed  Taken 10/10/2023 0300 by Karna RAMAN, RN  Safety Promotion/Fall Prevention:   activity supervised   fall prevention program maintained   motion sensor pad activated   nonskid shoes/slippers when out of bed   safety round/check completed  Taken 10/10/2023 0115 by Karna RAMAN, RN  Safety Promotion/Fall Prevention:   activity supervised   fall prevention program maintained   motion sensor pad activated   nonskid shoes/slippers when out of bed   safety round/check completed

## 2023-10-10 NOTE — Progress Notes (Signed)
 [facility]  Medicine Progress Note  FULL CODE: ATTEMPT RESUSCITATION/CPR    Edwin [name]  Date of service: 10/10/2023    Subjective: Edwin [name] is a 68 y.o. male with past medical history of CAD (5 stents), T2DM, GERD, HTN, TIA  who presented to [facility] ER 6/21 with shortness of breath. VS on arrival showed HR in the 150s and febrile to 103. Labs with lactate 3.4. CT of the chest performed after negative CXR with LUL pneumonia. Biofire with metapneumovirus. Admited to SCU for further treatment and monitoring of Sepsis 2/2 Pneumonia.  Continues to report subjective improvement. Deescalate antibiotics and steroids. Patient interested in discharge home Thursday if medically ready.       Vital Signs:  Temp (24hrs) Max:36.5 C (97.7 F)      Systolic (24hrs), Avg:128 , Min:121 , Max:143     Diastolic (24hrs), Avg:82, Min:74, Max:90    Temp  Avg: 36.2 C (97.2 F)  Min: 35.9 C (96.6 F)  Max: 36.5 C (97.7 F)  MAP (Non-Invasive)  Avg: 95 mmHG  Min: 88 mmHG  Max: 105 mmHG  Pulse  Avg: 73.1  Min: 60  Max: 83  Resp  Avg: 21.1  Min: 17  Max: 29  SpO2  Avg: 90.4 %  Min: 86 %  Max: 92 %     Fi02    I/O:  I/O last 24 hours:    Intake/Output Summary (Last 24 hours) at 10/10/2023 1451  Last data filed at 10/10/2023 1342  Gross per 24 hour   Intake 2410 ml   Output 1625 ml   Net 785 ml     I/O current shift:  06/24 0700 - 06/24 1859  In: 1690 [P.O.:1680]  Out: 900 [Urine:900]  Blood Sugars: Point of Care Glucose Last 24 Hr Results:  No results for input(s): GLUCOSEPOC in the last 24 hours.    acetaminophen  (TYLENOL ) tablet, 650 mg, Oral, Q4H PRN  aluminum-magnesium hydroxide-simethicone (MAG-AL PLUS) 200-200-20 mg per 5 mL oral liquid, 30 mL, Oral, Q4H PRN  aspirin  (ECOTRIN) enteric coated tablet 81 mg, 81 mg, Oral, Daily  atorvastatin  (LIPITOR) tablet, 40 mg, Oral, QPM  benzonatate (TESSALON) capsule, 100 mg, Oral, Q8H PRN  cefTRIAXone (ROCEPHIN) 1 g in NS 10 mL IV injection, 1 g, Intravenous,  Q24H  clopidogrel  (PLAVIX ) 75 mg tablet, 75 mg, Oral, Daily  D10W bolus infusion 250 mL, 250 mL, Intravenous, Q1H PRN  dapagliflozin  (FARXIGA ) tablet, 10 mg, Oral, Daily  dextromethorphan-guaiFENesin (ROBITUSSIN DM) 10-100mg  per 5mL oral liquid, 10 mL, Oral, Q4H PRN  dextrose  (GLUTOSE) 40% oral gel, 15 g, Oral, Q15 Min PRN  diphenoxylate-atropine (LOMOTIL) 2.5-0.025 mg per tablet, 1 Tablet, Oral, 4x/day PRN  doxycycline  tablet, 100 mg, Oral, 2x/day  enoxaparin  PF (LOVENOX ) 40 mg/0.4 mL SubQ injection, 40 mg, Subcutaneous, Daily  ergocalciferol-vitamin D2 (DRISDOL) capsule, 50,000 Units, Oral, Q7 Days  ezetimibe (ZETIA) tablet, 10 mg, Oral, QPM  fluticasone  (FLONASE ) 50 mcg per spray nasal spray, 2 Spray, Each Nostril, Daily  furosemide  (LASIX ) tablet, 20 mg, Oral, Daily  glucagon  (GLUCAGEN) injection 1 mg, 1 mg, IntraMUSCULAR, Once PRN  icosapent ethyl (VASCEPA) capsule, 2 g, Oral, 2x/day  ipratropium (ATROVENT) 0.02% nebulizer solution, 0.5 mg, Nebulization, Q6H  isosorbide  mononitrate (IMDUR ) 24 hr extended release tablet, 30 mg, Oral, Daily  levalbuterol (XOPENEX) 0.63 mg/ 3 mL nebulizer solution, 0.63 mg, Nebulization, Q6H PRN  levalbuterol (XOPENEX) 0.63 mg/ 3 mL nebulizer solution, 0.63 mg, Nebulization, Q6H  lidocaine  4% patch, 2 Patch, Transdermal,  Daily  melatonin tablet, 3 mg, Oral, HS PRN  methylPREDNISolone sod succ (SOLU-medrol) 40 mg/mL injection, 20 mg, Intravenous, Q8H  metoprolol  tartrate (LOPRESSOR ) tablet, 100 mg, Oral, 2x/day  NS 250 mL flush bag, , Intravenous, Q15 Min PRN  NS flush syringe, 1 mL, Intracatheter, Q1 MIN PRN  NS flush syringe, 10 mL, Intracatheter, Q8HRS  NS flush syringe, 10 mL, Intracatheter, Q1H PRN  ondansetron  (ZOFRAN ) 2 mg/mL injection, 4 mg, Intravenous, Q6H PRN  pantoprazole  (PROTONIX ) delayed release tablet, 40 mg, Oral, Daily  polyethylene glycol (MIRALAX ) oral packet, 17 g, Oral, Daily PRN  ranolazine  (RANEXA ) extended release tablet, 500 mg, Oral, 2x/day  rOPINIRole   (REQUIP ) tablet, 0.5 mg, Oral, 3x/day  sennosides-docusate sodium (SENOKOT-S) 8.6-50mg  per tablet, 1 Tablet, Oral, 2x/day PRN  sertraline (ZOLOFT) tablet, 50 mg, Oral, Daily  sodium chloride  (OCEAN) 0.65% nasal spray, 2 Spray, Each Nostril, Q4H PRN        Allergies[1]    Physical Exam:  Constitutional:  acutely ill  ENT:  Head atraumatic and normocephalic  Neck:  no thyromegaly or lymphadenopathy  Respiratory:  2L NC in place. Rhonchi in left upper lobe. No wheezing or crackles. Improved air movement   Cardiovascular:     Tachycardic to 130s, regular.   Gastrointestinal:  non-distended, Soft, non-tender, Bowel sounds normal  Genitourinary:  Deferred  Musculoskeletal:  Head atraumatic and normocephalic  Integumentary:  Skin warm and dry  Neurologic:  Grossly normal. Alert and oriented x3  Psychiatric:  Normal      Labs:  Lab Results Today:    Results for orders placed or performed during the hospital encounter of 10/07/23 (from the past 24 hours)   BASIC METABOLIC PANEL   Result Value Ref Range    SODIUM 136 136 - 145 mmol/L    POTASSIUM 4.3 3.5 - 5.1 mmol/L    CHLORIDE 103 96 - 111 mmol/L    CO2 TOTAL 26 23 - 31 mmol/L    ANION GAP 7 4 - 13 mmol/L    CALCIUM 8.6 8.6 - 10.3 mg/dL    GLUCOSE 874 65 - 874 mg/dL    BUN 24 8 - 25 mg/dL    CREATININE 9.09 9.24 - 1.35 mg/dL    BUN/CREA RATIO 27 (H) 6 - 22    ESTIMATED GFR - MALE >90 >=60 mL/min/BSA   MAGNESIUM   Result Value Ref Range    MAGNESIUM 2.2 1.8 - 2.6 mg/dL   CBC WITH DIFF   Result Value Ref Range    WBC 8.5 3.7 - 11.0 x10^3/uL    RBC 4.91 4.50 - 6.10 x10^6/uL    HGB 14.8 13.4 - 17.5 g/dL    HCT 58.2 61.0 - 47.9 %    MCV 84.9 78.0 - 100.0 fL    MCH 30.1 26.0 - 32.0 pg    MCHC 35.5 31.0 - 35.5 g/dL    RDW-CV 86.7 88.4 - 84.4 %    PLATELETS 176 150 - 400 x10^3/uL    MPV 9.2 8.7 - 12.5 fL    NEUTROPHIL % 77.4 %    LYMPHOCYTE % 14.8 %    MONOCYTE % 6.1 %    EOSINOPHIL % 0.0 %    BASOPHIL % 0.1 %    NEUTROPHIL # 6.61 1.50 - 7.70 x10^3/uL    LYMPHOCYTE # 1.26 1.00 -  4.80 x10^3/uL    MONOCYTE # 0.52 0.20 - 1.10 x10^3/uL    EOSINOPHIL # <0.04 <=0.50 x10^3/uL    BASOPHIL # <0.04 <=0.20 x10^3/uL  IMMATURE GRANULOCYTE % 1.6 (H) 0.0 - 1.0 %    IMMATURE GRANULOCYTE # 0.14 (H) <0.10 x10^3/uL       Radiology:  CXR with NAP    Microbiology:  Biofire with metapneumovirus     PT/OT: Yes    Consults: PT OT Social     Hardware (lines, foley's, tubes): PIV    Assessment/ Plan:   Active Hospital Problems    Diagnosis    Primary Problem: Sepsis due to pneumonia (CMS HCC)     Edwin [name] is a 68 y.o. male with past medical history of CAD (5 stents), T2DM, GERD, HTN, TIA  who presented to [facility] ER 6/21 with shortness of breath diagnosed with endobronchial pneumonia.      Sepsis  Acute Hypoxic Respiratory Failure  Endobronchial Pneumonia  - Patient meeting sepsis criteria on admission - HR 150s, temp 103. Started on Rocephin, Azithro. Broadened to Merrem, Doxy. Add steroids, nebs. Monitor in SCU. Sepsis resolved. Clinically improved. Deescalate antibiotics and steroids. O2 challenge tonight      CAD - Known CAD with 5 stents in place. Continue extensive cardiac regimen from home. ASA, Lipitor, Plavix , Farxiga , Zetia, Vascepa, Imdur , Lopressor   Mixed Systolic and Diastolic CHF - Continue home diuretic. Patient requiring O2 despite improvements in PNA. Continue to closely monitor.   GERD- Continue Protonix   Mood Disorder- Continue Zoloft  RLS- Continue Requip        DVT/PE Prophylaxis: Enoxaparin     Disposition Planning: Home discharge      Verdon Karma, DO  Hospitalist, [facility]            [1]   Allergies  Allergen Reactions    Gabapentin  Other Adverse Reaction (Add comment)     hallucinations      Morphine  Other Adverse Reaction (Add comment)     Hallucinations      Tylenol  Pm [Diphenhydramine-Acetaminophen ]      Patient denied      Keflex [Cephalexin] Nausea/ Vomiting    Victoza [Liraglutide] Nausea/ Vomiting

## 2023-10-10 NOTE — Care Plan (Signed)
 Patient rested in bed throughout the day and ambulated in room. Patient remains on 3L O2 With sats in the 90's. Patient changed his insulin  pump and sensor today. All medications administered without complications. Call light and bedside table within reach.   Problem: Adult Inpatient Plan of Care  Goal: Plan of Care Review  Outcome: Ongoing (see interventions/notes)  Goal: Patient-Specific Goal (Individualized)  Outcome: Ongoing (see interventions/notes)  Goal: Absence of Hospital-Acquired Illness or Injury  Outcome: Ongoing (see interventions/notes)  Intervention: Identify and Manage Fall Risk  Recent Flowsheet Documentation  Taken 10/10/2023 0835 by Rosina BROCKS, RN  Safety Promotion/Fall Prevention:   activity supervised   nonskid shoes/slippers when out of bed   safety round/check completed  Intervention: Prevent Skin Injury  Recent Flowsheet Documentation  Taken 10/10/2023 0835 by Rosina BROCKS, RN  Body Position: supine, head elevated  Skin Protection:   adhesive use limited   tubing/devices free from skin contact  Intervention: Prevent and Manage VTE (Venous Thromboembolism) Risk  Recent Flowsheet Documentation  Taken 10/10/2023 0835 by Rosina BROCKS, RN  VTE Prevention/Management:   ambulation promoted   anticoagulant therapy maintained  Intervention: Prevent Infection  Recent Flowsheet Documentation  Taken 10/10/2023 0835 by Rosina BROCKS, RN  Infection Prevention:   single patient room provided   promote handwashing   rest/sleep promoted  Goal: Optimal Comfort and Wellbeing  Outcome: Ongoing (see interventions/notes)  Goal: Rounds/Family Conference  Outcome: Ongoing (see interventions/notes)     Problem: Gas Exchange Impaired  Goal: Optimal Gas Exchange  Outcome: Ongoing (see interventions/notes)     Problem: Chest Pain  Goal: Resolution of Chest Pain Symptoms  Outcome: Ongoing (see interventions/notes)     Problem: Fall Injury Risk  Goal: Absence of Fall and Fall-Related Injury  Outcome: Ongoing (see  interventions/notes)  Intervention: Identify and Manage Contributors  Recent Flowsheet Documentation  Taken 10/10/2023 0835 by Rosina BROCKS, RN  Medication Review/Management: medications reviewed  Intervention: Promote Injury-Free Environment  Recent Flowsheet Documentation  Taken 10/10/2023 9164 by Rosina BROCKS, RN  Safety Promotion/Fall Prevention:   activity supervised   nonskid shoes/slippers when out of bed   safety round/check completed

## 2023-10-11 LAB — CBC WITH DIFF
BASOPHIL #: <0.04 10*3/uL (ref <=0.20)
BASOPHIL %: 0.1 %
EOSINOPHIL #: <0.04 10*3/uL (ref <=0.50)
EOSINOPHIL %: 0 %
HCT: 42.3 % (ref 38.9–52.0)
HGB: 15 g/dL (ref 13.4–17.5)
IMMATURE GRANULOCYTE #: 0.23 10*3/uL — ABNORMAL HIGH (ref <0.10)
IMMATURE GRANULOCYTE %: 3.1 % — ABNORMAL HIGH (ref 0.0–1.0)
LYMPHOCYTE #: 1.23 10*3/uL (ref 1.00–4.80)
LYMPHOCYTE %: 16.5 %
MCH: 30.2 pg (ref 26.0–32.0)
MCHC: 35.5 g/dL (ref 31.0–35.5)
MCV: 85.3 fL (ref 78.0–100.0)
MONOCYTE #: 0.44 10*3/uL (ref 0.20–1.10)
MONOCYTE %: 5.9 %
MPV: 9.6 fL (ref 8.7–12.5)
NEUTROPHIL #: 5.55 10*3/uL (ref 1.50–7.70)
NEUTROPHIL %: 74.4 %
PLATELETS: 192 10*3/uL (ref 150–400)
RBC: 4.96 10*6/uL (ref 4.50–6.10)
RDW-CV: 13.2 % (ref 11.5–15.5)
WBC: 7.5 10*3/uL (ref 3.7–11.0)

## 2023-10-11 LAB — BASIC METABOLIC PANEL
ANION GAP: 6 mmol/L (ref 4–13)
BUN/CREA RATIO: 29 — ABNORMAL HIGH (ref 6–22)
BUN: 26 mg/dL — ABNORMAL HIGH (ref 8–25)
CALCIUM: 8.5 mg/dL — ABNORMAL LOW (ref 8.6–10.3)
CHLORIDE: 103 mmol/L (ref 96–111)
CO2 TOTAL: 28 mmol/L (ref 23–31)
CREATININE: 0.9 mg/dL (ref 0.75–1.35)
ESTIMATED GFR - MALE: >90 mL/min/BSA (ref >=60)
GLUCOSE: 199 mg/dL — ABNORMAL HIGH (ref 65–125)
POTASSIUM: 4.4 mmol/L (ref 3.5–5.1)
SODIUM: 137 mmol/L (ref 136–145)

## 2023-10-11 LAB — MAGNESIUM: MAGNESIUM: 2.3 mg/dL (ref 1.8–2.6)

## 2023-10-11 MED ORDER — METHYLPREDNISOLONE SOD SUCCINATE 40 MG/ML SOLUTION FOR INJ. WRAPPER
20.0000 mg | Freq: Two times a day (BID) | INTRAMUSCULAR | Status: DC
Start: 2023-10-11 — End: 2023-10-12
  Administered 2023-10-11 – 2023-10-12 (×2): 20 mg via INTRAVENOUS
  Filled 2023-10-11 (×4): qty 1

## 2023-10-11 NOTE — Progress Notes (Signed)
 Edwin [name]  Medicine Progress Note  FULL CODE: ATTEMPT RESUSCITATION/CPR    Reeves Dasie Lauth  Date of service: 10/11/2023    Subjective: Edwin [name] is a 68 y.o. male with past medical history of CAD (5 stents), T2DM, GERD, HTN, TIA  who presented to [facility] ER 6/21 with shortness of breath. VS on arrival showed HR in the 150s and febrile to 103. Labs with lactate 3.4. CT of the chest performed after negative CXR with LUL pneumonia. Biofire with metapneumovirus. Admited to SCU for further treatment and monitoring of Sepsis 2/2 Pneumonia.  Continues to report subjective improvement. Deescalate antibiotics and steroids. Down to 1L NC. Will plan to d/c home tomorrow with home O2.       Vital Signs:  Temp (24hrs) Max:36.5 C (97.7 F)      Systolic (24hrs), Avg:122 , Min:111 , Max:139     Diastolic (24hrs), Avg:83, Min:72, Max:102    Temp  Avg: 36.1 C (96.9 F)  Min: 35.7 C (96.3 F)  Max: 36.5 C (97.7 F)  MAP (Non-Invasive)  Avg: 95.9 mmHG  Min: 89 mmHG  Max: 109 mmHG  Pulse  Avg: 75.7  Min: 68  Max: 86  Resp  Avg: 21.2  Min: 16  Max: 28  SpO2  Avg: 93.1 %  Min: 90 %  Max: 98 %     Fi02    I/O:  I/O last 24 hours:    Intake/Output Summary (Last 24 hours) at 10/11/2023 1559  Last data filed at 10/11/2023 1409  Gross per 24 hour   Intake 1570 ml   Output 1600 ml   Net -30 ml     I/O current shift:  06/25 0700 - 06/25 1859  In: 850 [P.O.:840]  Out: -   Blood Sugars: Point of Care Glucose Last 24 Hr Results:  No results for input(s): GLUCOSEPOC in the last 24 hours.    acetaminophen  (TYLENOL ) tablet, 650 mg, Oral, Q4H PRN  aluminum -magnesium hydroxide-simethicone  (MAG-AL PLUS) 200-200-20 mg per 5 mL oral liquid, 30 mL, Oral, Q4H PRN  aspirin  (ECOTRIN) enteric coated tablet 81 mg, 81 mg, Oral, Daily  atorvastatin  (LIPITOR) tablet, 40 mg, Oral, QPM  benzonatate  (TESSALON ) capsule, 100 mg, Oral, Q8H PRN  cefTRIAXone  (ROCEPHIN ) 1 g in NS 10 mL IV injection, 1 g, Intravenous, Q24H  clopidogrel  (PLAVIX ) 75  mg tablet, 75 mg, Oral, Daily  D10W bolus infusion 250 mL, 250 mL, Intravenous, Q1H PRN  dapagliflozin  (FARXIGA ) tablet, 10 mg, Oral, Daily  dextromethorphan -guaiFENesin  (ROBITUSSIN DM) 10-100mg  per 5mL oral liquid, 10 mL, Oral, Q4H PRN  dextrose  (GLUTOSE) 40% oral gel, 15 g, Oral, Q15 Min PRN  diphenoxylate -atropine  (LOMOTIL ) 2.5-0.025 mg per tablet, 1 Tablet, Oral, 4x/day PRN  doxycycline  tablet, 100 mg, Oral, 2x/day  enoxaparin  PF (LOVENOX ) 40 mg/0.4 mL SubQ injection, 40 mg, Subcutaneous, Daily  ergocalciferol -vitamin D2 (DRISDOL ) capsule, 50,000 Units, Oral, Q7 Days  ezetimibe  (ZETIA ) tablet, 10 mg, Oral, QPM  fluticasone  (FLONASE ) 50 mcg per spray nasal spray, 2 Spray, Each Nostril, Daily  furosemide  (LASIX ) tablet, 20 mg, Oral, Daily  glucagon  (GLUCAGEN) injection 1 mg, 1 mg, IntraMUSCULAR, Once PRN  icosapent  ethyl (VASCEPA ) capsule, 2 g, Oral, 2x/day  ipratropium (ATROVENT ) 0.02% nebulizer solution, 0.5 mg, Nebulization, Q6H  isosorbide  mononitrate (IMDUR ) 24 hr extended release tablet, 30 mg, Oral, Daily  levalbuterol  (XOPENEX ) 0.63 mg/ 3 mL nebulizer solution, 0.63 mg, Nebulization, Q6H PRN  levalbuterol  (XOPENEX ) 0.63 mg/ 3 mL nebulizer solution, 0.63 mg, Nebulization, Q6H  lidocaine  4%  patch, 2 Patch, Transdermal, Daily  melatonin tablet, 3 mg, Oral, HS PRN  methylPREDNISolone  sod succ (SOLU-medrol ) 40 mg/mL injection, 20 mg, Intravenous, Q12H  metoprolol  tartrate (LOPRESSOR ) tablet, 100 mg, Oral, 2x/day  NS 250 mL flush bag, , Intravenous, Q15 Min PRN  NS flush syringe, 1 mL, Intracatheter, Q1 MIN PRN  NS flush syringe, 10 mL, Intracatheter, Q8HRS  NS flush syringe, 10 mL, Intracatheter, Q1H PRN  ondansetron  (ZOFRAN ) 2 mg/mL injection, 4 mg, Intravenous, Q6H PRN  pantoprazole  (PROTONIX ) delayed release tablet, 40 mg, Oral, Daily  polyethylene glycol (MIRALAX ) oral packet, 17 g, Oral, Daily PRN  ranolazine  (RANEXA ) extended release tablet, 500 mg, Oral, 2x/day  rOPINIRole  (REQUIP ) tablet, 0.5 mg, Oral,  3x/day  sennosides-docusate sodium  (SENOKOT-S) 8.6-50mg  per tablet, 1 Tablet, Oral, 2x/day PRN  sertraline  (ZOLOFT ) tablet, 50 mg, Oral, Daily  sodium chloride  (OCEAN) 0.65% nasal spray, 2 Spray, Each Nostril, Q4H PRN        Allergies[1]    Physical Exam:  Constitutional:  acutely ill  ENT:  Head atraumatic and normocephalic  Neck:  no thyromegaly or lymphadenopathy  Respiratory:  2L NC in place. Rhonchi in left upper lobe. No wheezing or crackles. Improved air movement   Cardiovascular:     Tachycardic to 130s, regular.   Gastrointestinal:  non-distended, Soft, non-tender, Bowel sounds normal  Genitourinary:  Deferred  Musculoskeletal:  Head atraumatic and normocephalic  Integumentary:  Skin warm and dry  Neurologic:  Grossly normal. Alert and oriented x3  Psychiatric:  Normal      Labs:  Lab Results Today:    Results for orders placed or performed during the hospital encounter of 10/07/23 (from the past 24 hours)   BASIC METABOLIC PANEL   Result Value Ref Range    SODIUM 137 136 - 145 mmol/L    POTASSIUM 4.4 3.5 - 5.1 mmol/L    CHLORIDE 103 96 - 111 mmol/L    CO2 TOTAL 28 23 - 31 mmol/L    ANION GAP 6 4 - 13 mmol/L    CALCIUM 8.5 (L) 8.6 - 10.3 mg/dL    GLUCOSE 800 (H) 65 - 125 mg/dL    BUN 26 (H) 8 - 25 mg/dL    CREATININE 9.09 9.24 - 1.35 mg/dL    BUN/CREA RATIO 29 (H) 6 - 22    ESTIMATED GFR - MALE >90 >=60 mL/min/BSA   MAGNESIUM   Result Value Ref Range    MAGNESIUM 2.3 1.8 - 2.6 mg/dL   CBC WITH DIFF   Result Value Ref Range    WBC 7.5 3.7 - 11.0 x10^3/uL    RBC 4.96 4.50 - 6.10 x10^6/uL    HGB 15.0 13.4 - 17.5 g/dL    HCT 57.6 61.0 - 47.9 %    MCV 85.3 78.0 - 100.0 fL    MCH 30.2 26.0 - 32.0 pg    MCHC 35.5 31.0 - 35.5 g/dL    RDW-CV 86.7 88.4 - 84.4 %    PLATELETS 192 150 - 400 x10^3/uL    MPV 9.6 8.7 - 12.5 fL    NEUTROPHIL % 74.4 %    LYMPHOCYTE % 16.5 %    MONOCYTE % 5.9 %    EOSINOPHIL % 0.0 %    BASOPHIL % 0.1 %    NEUTROPHIL # 5.55 1.50 - 7.70 x10^3/uL    LYMPHOCYTE # 1.23 1.00 - 4.80 x10^3/uL     MONOCYTE # 0.44 0.20 - 1.10 x10^3/uL    EOSINOPHIL # <0.04 <=0.50 x10^3/uL  BASOPHIL # <0.04 <=0.20 x10^3/uL    IMMATURE GRANULOCYTE % 3.1 (H) 0.0 - 1.0 %    IMMATURE GRANULOCYTE # 0.23 (H) <0.10 x10^3/uL       Radiology:  CXR with NAP    Microbiology:  Biofire with metapneumovirus     PT/OT: Yes    Consults: PT OT Social     Hardware (lines, foley's, tubes): PIV    Assessment/ Plan:   Active Hospital Problems    Diagnosis    Primary Problem: Sepsis due to pneumonia (CMS [facility])     Ripley Bogosian is a 68 y.o. male with past medical history of CAD (5 stents), T2DM, GERD, HTN, TIA  who presented to [facility] ER 6/21 with shortness of breath diagnosed with endobronchial pneumonia.      Sepsis  Acute Hypoxic Respiratory Failure  Endobronchial Pneumonia  - Patient meeting sepsis criteria on admission - HR 150s, temp 103. Started on Rocephin , Azithro. Broadened to Merrem , Doxy. Add steroids, nebs. Monitor in SCU. Sepsis resolved. Clinically improved. Deescalate antibiotics and steroids. O2 challenge performed and patient will require home O2 on discharge.      CAD - Known CAD with 5 stents in place. Continue extensive cardiac regimen from home. ASA, Lipitor, Plavix , Farxiga , Zetia , Vascepa , Imdur , Lopressor   Mixed Systolic and Diastolic CHF - Continue home diuretic. Patient requiring O2 despite improvements in PNA. Continue to closely monitor.   GERD- Continue Protonix   Mood Disorder- Continue Zoloft   RLS- Continue Requip        DVT/PE Prophylaxis: Enoxaparin     Disposition Planning: Home discharge      Verdon Karma, DO  Hospitalist, [facility]            [1]   Allergies  Allergen Reactions    Gabapentin  Other Adverse Reaction (Add comment)     hallucinations      Morphine  Other Adverse Reaction (Add comment)     Hallucinations      Tylenol  Pm [Diphenhydramine-Acetaminophen ]      Patient denied      Keflex [Cephalexin] Nausea/ Vomiting    Victoza [Liraglutide] Nausea/ Vomiting

## 2023-10-11 NOTE — Nurses Notes (Signed)
 Attempting to titrate O2, RA sat 86% at rest sitting in recliner. O2 2L NC reapplied.

## 2023-10-11 NOTE — Care Plan (Signed)
 Problem: Adult Inpatient Plan of Care  Goal: Plan of Care Review  Outcome: Ongoing (see interventions/notes)  Goal: Patient-Specific Goal (Individualized)  Outcome: Ongoing (see interventions/notes)  Goal: Absence of Hospital-Acquired Illness or Injury  Outcome: Ongoing (see interventions/notes)  Intervention: Identify and Manage Fall Risk  Recent Flowsheet Documentation  Taken 10/11/2023 0800 by Eleanor DASEN, RN  Safety Promotion/Fall Prevention:   activity supervised   fall prevention program maintained   muscle strengthening facilitated   motion sensor pad activated   nonskid shoes/slippers when out of bed   safety round/check completed  Intervention: Prevent and Manage VTE (Venous Thromboembolism) Risk  Recent Flowsheet Documentation  Taken 10/11/2023 0800 by Eleanor DASEN, RN  VTE Prevention/Management:   ambulation promoted   anticoagulant therapy maintained  Goal: Optimal Comfort and Wellbeing  Outcome: Ongoing (see interventions/notes)  Goal: Rounds/Family Conference  Outcome: Ongoing (see interventions/notes)     Problem: Gas Exchange Impaired  Goal: Optimal Gas Exchange  Outcome: Ongoing (see interventions/notes)     Problem: Chest Pain  Goal: Resolution of Chest Pain Symptoms  Outcome: Ongoing (see interventions/notes)     Problem: Fall Injury Risk  Goal: Absence of Fall and Fall-Related Injury  Outcome: Ongoing (see interventions/notes)  Intervention: Identify and Manage Contributors  Recent Flowsheet Documentation  Taken 10/11/2023 0800 by Eleanor DASEN, RN  Medication Review/Management: medications reviewed  Intervention: Promote Injury-Free Environment  Recent Flowsheet Documentation  Taken 10/11/2023 0800 by Eleanor DASEN, RN  Safety Promotion/Fall Prevention:   activity supervised   fall prevention program maintained   muscle strengthening facilitated   motion sensor pad activated   nonskid shoes/slippers when out of bed   safety round/check completed

## 2023-10-11 NOTE — Care Plan (Signed)
 Pt rested well with VSS via tele. No s/s of distress noted. Pt has safety precautions in place and call bell within reach.       Problem: Adult Inpatient Plan of Care  Goal: Plan of Care Review  Outcome: Ongoing (see interventions/notes)  Goal: Patient-Specific Goal (Individualized)  Outcome: Ongoing (see interventions/notes)  Flowsheets (Taken 10/10/2023 1953)  Anxieties, Fears or Concerns: NONE  Goal: Absence of Hospital-Acquired Illness or Injury  Outcome: Ongoing (see interventions/notes)  Intervention: Identify and Manage Fall Risk  Recent Flowsheet Documentation  Taken 10/10/2023 1953 by Ronal DEL, RN  Safety Promotion/Fall Prevention: activity supervised  Intervention: Prevent Skin Injury  Recent Flowsheet Documentation  Taken 10/10/2023 1953 by Ronal DEL, RN  Body Position: sitting  Skin Protection:   adhesive use limited   transparent dressing maintained   tubing/devices free from skin contact  Intervention: Prevent and Manage VTE (Venous Thromboembolism) Risk  Recent Flowsheet Documentation  Taken 10/10/2023 1953 by Ronal DEL, RN  VTE Prevention/Management:   ambulation promoted   anticoagulant therapy maintained  Intervention: Prevent Infection  Recent Flowsheet Documentation  Taken 10/10/2023 1953 by Ronal DEL, RN  Infection Prevention:   promote handwashing   rest/sleep promoted   single patient room provided  Goal: Optimal Comfort and Wellbeing  Outcome: Ongoing (see interventions/notes)  Intervention: Provide Person-Centered Care  Recent Flowsheet Documentation  Taken 10/10/2023 1953 by Ronal DEL, RN  Trust Relationship/Rapport:   care explained   choices provided   questions encouraged   thoughts/feelings acknowledged  Goal: Rounds/Family Conference  Outcome: Ongoing (see interventions/notes)     Problem: Gas Exchange Impaired  Goal: Optimal Gas Exchange  Outcome: Ongoing (see interventions/notes)     Problem: Chest Pain  Goal: Resolution of Chest Pain Symptoms  Outcome: Ongoing (see interventions/notes)     Problem:  Fall Injury Risk  Goal: Absence of Fall and Fall-Related Injury  Outcome: Ongoing (see interventions/notes)  Intervention: Identify and Manage Contributors  Recent Flowsheet Documentation  Taken 10/10/2023 1953 by Ronal DEL, RN  Self-Care Promotion: independence encouraged  Medication Review/Management: medications reviewed  Intervention: Promote Injury-Free Environment  Recent Flowsheet Documentation  Taken 10/10/2023 1953 by Ronal DEL, RN  Safety Promotion/Fall Prevention: activity supervised

## 2023-10-11 NOTE — Nurses Notes (Signed)
 Uneventful day, Tele SR/VSS. Sat in recliner, ambulating in room tolerating well. For possible discharge home tomorrow

## 2023-10-12 LAB — MANUAL DIFF AND MORPHOLOGY-SYSMEX
BASOPHIL #: <0.04 10*3/uL (ref <=0.20)
BASOPHIL %: 0 %
EOSINOPHIL #: <0.04 10*3/uL (ref <=0.50)
EOSINOPHIL %: 0 %
LYMPHOCYTE #: 0.76 10*3/uL — ABNORMAL LOW (ref 1.00–4.80)
LYMPHOCYTE %: 9 %
MONOCYTE #: 0.25 10*3/uL (ref 0.20–1.10)
MONOCYTE %: 3 %
NEUTROPHIL #: 7.39 10*3/uL (ref 1.50–7.70)
NEUTROPHIL %: 88 %
RBC MORPHOLOGY: NORMAL

## 2023-10-12 LAB — CBC WITH DIFF
HCT: 41.8 % (ref 38.9–52.0)
HGB: 15 g/dL (ref 13.4–17.5)
MCH: 30.1 pg (ref 26.0–32.0)
MCHC: 35.9 g/dL — ABNORMAL HIGH (ref 31.0–35.5)
MCV: 83.8 fL (ref 78.0–100.0)
MPV: 9.8 fL (ref 8.7–12.5)
PLATELETS: 189 10*3/uL (ref 150–400)
RBC: 4.99 10*6/uL (ref 4.50–6.10)
RDW-CV: 13 % (ref 11.5–15.5)
WBC: 8.4 10*3/uL (ref 3.7–11.0)

## 2023-10-12 LAB — BASIC METABOLIC PANEL
ANION GAP: 8 mmol/L (ref 4–13)
BUN/CREA RATIO: 34 — ABNORMAL HIGH (ref 6–22)
BUN: 27 mg/dL — ABNORMAL HIGH (ref 8–25)
CALCIUM: 8.4 mg/dL — ABNORMAL LOW (ref 8.6–10.3)
CHLORIDE: 103 mmol/L (ref 96–111)
CO2 TOTAL: 25 mmol/L (ref 23–31)
CREATININE: 0.8 mg/dL (ref 0.75–1.35)
ESTIMATED GFR - MALE: >90 mL/min/BSA (ref >=60)
GLUCOSE: 192 mg/dL — ABNORMAL HIGH (ref 65–125)
POTASSIUM: 4.2 mmol/L (ref 3.5–5.1)
SODIUM: 136 mmol/L (ref 136–145)

## 2023-10-12 LAB — ADULT ROUTINE BLOOD CULTURE, SET OF 2 BOTTLES (BACTERIA AND YEAST)
BLOOD CULTURE, ROUTINE: NO GROWTH
BLOOD CULTURE, ROUTINE: NO GROWTH

## 2023-10-12 LAB — MAGNESIUM: MAGNESIUM: 2.4 mg/dL (ref 1.8–2.6)

## 2023-10-12 MED ORDER — BENZONATATE 100 MG CAPSULE: 100.0000 mg | ORAL_CAPSULE | Freq: Three times a day (TID) | ORAL | 0 refills | Status: DC | PRN

## 2023-10-12 MED ORDER — DOXYCYCLINE HYCLATE 100 MG TABLET
100.0000 mg | ORAL_TABLET | Freq: Two times a day (BID) | ORAL | 0 refills | Status: AC
Start: 2023-10-12 — End: 2023-10-17

## 2023-10-12 MED ORDER — PREDNISONE 10 MG TABLET
ORAL_TABLET | ORAL | 0 refills | Status: AC
Start: 2023-10-12 — End: 2023-10-17

## 2023-10-12 MED ORDER — CEFDINIR 300 MG CAPSULE
300.0000 mg | ORAL_CAPSULE | Freq: Every day | ORAL | 0 refills | Status: DC
Start: 2023-10-12 — End: 2023-10-12

## 2023-10-12 MED ORDER — CEFDINIR 300 MG CAPSULE
300.0000 mg | ORAL_CAPSULE | Freq: Two times a day (BID) | ORAL | 0 refills | Status: AC
Start: 2023-10-12 — End: 2023-10-17

## 2023-10-12 NOTE — Care Management Notes (Signed)
 [facility]  Care Management Note    Patient Name: Edwin [name]  Date of Birth: April 01, 1956  Sex: male  Date/Time of Admission: 10/07/2023  7:16 PM  Room/Bed: 56/A  Payor: [place] MEDICARE / Plan: [place] MEDICARE ADVANTAGE PPO / Product Type: PPO /    LOS: 2 days   Primary Care Providers:  Froylan Jester, MD, MD (General)    Admitting Diagnosis:  Sepsis due to pneumonia (CMS [facility]) [J18.9, A41.9]    Assessment:      10/12/23 1400   Assessment Detail   Assessment Type Continued Assessment   Date of Care Management Update 10/12/23   Medicare Intent to Discharge Documentation   Discharge IMM give to: Patient   Discharge IMM Letter Given Date 10/12/23   Discharge IMM Letter Given Time 1411   IMM explained/reviewed with:  Patient;verbalized understanding   Social Work Plan   Discharge Planning Status discharge plan complete   Projected Discharge Date 10/12/23   Anticipated Discharge Disposition Home   CM will evaluate for rehabilitation potential no   Patient choice offered to patient/family Yes   Form for patient choice reviewed/signed and on chart yes   Facility or Agency Preferences [facility]   Plan Pt is being DC to home today with home O2 set up through [facility].   Patient/Family in Agreement with Plan yes   CM to Do MSW completed DC paperwork with pt and set up pt's home O2.         Discharge Plan:  Home with DME (code 1)  Pt is being DC to home and he feels ready for DC. Nurse noted pt was at 86%RA at rest yesterday, which qualifies him for home O2. Provided patient CarePort listing with CMS ratings for DME serving the [phone] zip code. The list provided is available in CarePort. Pt chose [facility] and script was faxed. Pt was worried about the cost of his O2. Melissa at [facility] stated it would cost him $59.57 but he could also apply for a hardship program to see if he could get help with his O2. Melissa faxed the form to MSW and MSW helped pt fill out the  form. Pt is aware he needs to submit a copy of his household bills, his SSI and bank statement along with the application to complete the process. MSW explained IMM and FOC notice showing pt will get home O2 through [facility] with pt; he verbalized understanding, did not have any questions and signed the forms. [facility] will deliver a portable O2 tank for pt and pt will swing by the store to pick up the concentrator. MSW also talked to pt about completing [employer]PO[employer] paperwork as MSW [place] stated he was interested. Pt stated he is interested but would rather take the booklet home to complete there. MSW walked through and explained every section to pt and also provided MSW's phone number if he needs assistance. Pt verbalized understanding. He denied any other needs at this time.     The patient will continue to be evaluated for developing discharge needs.     Case Manager: Daphne Ruth, SOCIAL WORKER  Phone: [phone] ext. [phone]

## 2023-10-12 NOTE — Nurses Notes (Signed)
 Patient discharged home with family.  AVS reviewed with patient.  A written copy of the AVS and discharge instructions was given to the patient.  Questions sufficiently answered as needed.  Patient encouraged to follow up with PCP as indicated.  In the event of an emergency, patient instructed to call 911 or go to the nearest emergency room.

## 2023-10-12 NOTE — Care Plan (Signed)
 Pt awake most of shift. Pt stated he was nervous about going home today but excited. Pt denies pain, sob, or nausea. VSS via tele. Safety measures in place and call light within reach.     Problem: Adult Inpatient Plan of Care  Goal: Plan of Care Review  Outcome: Ongoing (see interventions/notes)  Goal: Patient-Specific Goal (Individualized)  Outcome: Ongoing (see interventions/notes)  Goal: Absence of Hospital-Acquired Illness or Injury  Outcome: Ongoing (see interventions/notes)  Intervention: Identify and Manage Fall Risk  Recent Flowsheet Documentation  Taken 10/11/2023 2043 by Ronal DEL, RN  Safety Promotion/Fall Prevention:   activity supervised   fall prevention program maintained   nonskid shoes/slippers when out of bed   safety round/check completed  Intervention: Prevent Skin Injury  Recent Flowsheet Documentation  Taken 10/11/2023 2043 by Ronal DEL, RN  Body Position: sitting  Skin Protection:   adhesive use limited   transparent dressing maintained   tubing/devices free from skin contact  Intervention: Prevent and Manage VTE (Venous Thromboembolism) Risk  Recent Flowsheet Documentation  Taken 10/11/2023 2043 by Ronal DEL, RN  VTE Prevention/Management:   ambulation promoted   anticoagulant therapy maintained  Intervention: Prevent Infection  Recent Flowsheet Documentation  Taken 10/11/2023 2043 by Ronal DEL, RN  Infection Prevention:   promote handwashing   rest/sleep promoted   single patient room provided   glycemic control managed  Goal: Optimal Comfort and Wellbeing  Outcome: Ongoing (see interventions/notes)  Intervention: Provide Person-Centered Care  Recent Flowsheet Documentation  Taken 10/11/2023 2043 by Ronal DEL, RN  Trust Relationship/Rapport:   care explained   choices provided   thoughts/feelings acknowledged  Goal: Rounds/Family Conference  Outcome: Ongoing (see interventions/notes)     Problem: Gas Exchange Impaired  Goal: Optimal Gas Exchange  Outcome: Ongoing (see interventions/notes)     Problem: Chest  Pain  Goal: Resolution of Chest Pain Symptoms  Outcome: Ongoing (see interventions/notes)     Problem: Fall Injury Risk  Goal: Absence of Fall and Fall-Related Injury  Outcome: Ongoing (see interventions/notes)  Intervention: Identify and Manage Contributors  Recent Flowsheet Documentation  Taken 10/11/2023 2043 by Ronal DEL, RN  Self-Care Promotion: independence encouraged  Medication Review/Management: medications reviewed  Intervention: Promote Injury-Free Environment  Recent Flowsheet Documentation  Taken 10/11/2023 2043 by Ronal DEL, RN  Safety Promotion/Fall Prevention:   activity supervised   fall prevention program maintained   nonskid shoes/slippers when out of bed   safety round/check completed

## 2023-10-12 NOTE — Discharge Summary (Addendum)
 [facility]  DISCHARGE SUMMARY    PATIENT NAME:  Arciga, Edwin [name]  MRN:  [id number]  DOB:  08-Sep-1955    ENCOUNTER DATE:  10/07/2023  INPATIENT ADMISSION DATE: 10/10/2023  DISCHARGE DATE:  10/12/2023    ATTENDING PHYSICIAN: Thersia Baller, DO  SERVICE: [facility] HOSPITALIST  PRIMARY CARE PHYSICIAN: Leafy Argyle, MD       No lay caregiver identified.    PRIMARY DISCHARGE DIAGNOSIS: Sepsis due to pneumonia (CMS [facility])  Active Hospital Problems    Diagnosis Date Noted    Principal Problem: Sepsis due to pneumonia (CMS [facility]) [J18.9, A41.9] 10/07/2023      Resolved Hospital Problems   No resolved problems to display.     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  Please monitor glucose readings leading to PCP follow up and discuss current regimen.   Generic drug: insulin  lispro   100 Units, Daily  Refills: 0            START taking these medications.        Details   benzonatate 100 mg Capsule  Commonly known as: TESSALON   100 mg, Oral, EVERY 8 HOURS PRN  Qty: 20 Capsule  Refills: 0     cefdinir 300 mg Capsule  Commonly known as: OMNICEF   300 mg, Oral, 2 TIMES DAILY  Qty: 10 Capsule  Refills: 0     doxycycline  hyclate 100 mg Tablet   100 mg, Oral, 2 TIMES DAILY  Qty: 10 Tablet  Refills: 0     predniSONE  10 mg Tablet  Commonly known as: DELTASONE   Start taking on: October 12, 2023   Take 2 Tablets (20 mg total) by mouth Twice daily for 1 day, THEN 1 Tablet (10 mg total) Twice daily for 2 days, THEN 1 Tablet (10 mg total) Daily for 2 days.  Qty: 10 Tablet  Refills: 0             CONTINUE these medications - NO CHANGES were made during your visit.        Details   acetaminophen  500 mg Tablet  Commonly known as: TYLENOL    500 mg, EVERY 4 HOURS PRN  Refills: 0     albuterol  sulfate 90 mcg/actuation oral inhaler  Commonly known as: PROVENTIL  or VENTOLIN  or PROAIR    2 Puffs  Refills: 0     aspirin  81 mg Tablet, Delayed Release (E.C.)  Commonly known as: ECOTRIN   81 mg, Oral, Daily  Refills: 0     atorvastatin  40 mg Tablet  Commonly known as: LIPITOR   80 mg, Oral, EVERY EVENING  Refills: 1     clopidogreL  75 mg Tablet  Commonly known as: PLAVIX    TAKE ONE TABLET ( 75 MG ) BY MOUTH ONCE DAILY  Qty: 90 Tablet  Refills: 2     DICLOFENAC SODIUM-MENTHOL EXT   Apply topically  Refills: 0  ergocalciferol (vitamin D2) 1,250 mcg (50,000 unit) Capsule  Commonly known as: DRISDOL   50,000 Units, EVERY 7 DAYS  Refills: 0     ezetimibe 10 mg Tablet  Commonly known as: ZETIA   10 mg, EVERY EVENING  Refills: 0     Farxiga  10 mg Tablet  Generic drug: dapagliflozin  propanediol   10 mg, Oral, Daily  Qty: 90 Tablet  Refills: 3     furosemide  20 mg Tablet  Commonly known as: LASIX    20 mg, Oral, Daily  Qty: 90 Tablet  Refills: 3     ipratropium bromide 21 mcg (0.03 %) nasal spray  Commonly known as: ATROVENT   2 Sprays, 3 TIMES DAILY  Refills: 0     isosorbide  mononitrate 30 mg Tablet Sustained Release 24 hr  Commonly known as: IMDUR    TAKE ONE TABLET ( 30 MG ) BY MOUTH EVERY MORNING  Qty: 90 Tablet  Refills: 0     lidocaine  5 % Adhesive Patch, Medicated  Commonly known as: LIDODERM    1 Patch, Daily  Refills: 0     LomotiL 2.5-0.025 mg Tablet  Generic drug: diphenoxylate-atropine   1 Tablet, 4 TIMES DAILY PRN  Refills: 0     metoprolol  tartrate 100 mg Tablet  Commonly known as: LOPRESSOR    100 mg, Oral, 2 TIMES DAILY  Qty: 180 Tablet  Refills: 3     mupirocin  2 % Ointment  Commonly known as: BACTROBAN    2 TIMES DAILY  Refills: 0     nitroGLYCERIN  0.4 mg Tablet, Sublingual  Commonly known as:  NITROSTAT    DISSOLVE 1 TABLET UNDER TONGUE AT FIRST SIGN OF CHEST PAIN AS NEEDED, MAY REPEAT EVERY 5 MINS. UP TO 3 DOSES, NO MORE THAN 3 TABS IN A 15 MIN. PERIOD  Qty: 25 Tablet  Refills: 1     ondansetron  4 mg Tablet, Rapid Dissolve  Commonly known as: ZOFRAN  ODT   No dose, route, or frequency recorded.  Refills: 0     Ozempic 0.25 mg or 0.5 mg (2 mg/3 mL) Pen Injector  Generic drug: semaglutide   No dose, route, or frequency recorded.  Refills: 0     pantoprazole  40 mg Tablet, Delayed Release (E.C.)  Commonly known as: PROTONIX    40 mg, Daily  Refills: 0     polyethylene glycol 17 gram Powder in Packet  Commonly known as: MIRALAX    17 g, Daily  Refills: 0     ranolazine  500 mg Tablet Sustained Release 12 hr  Commonly known as: RANEXA    TAKE ONE TABLET ( 500 MG ) BY MOUTH TWICE DAILY  Qty: 180 Tablet  Refills: 1     rOPINIRole  0.5 mg Tablet  Commonly known as: REQUIP    0.5 mg, 3 TIMES DAILY  Refills: 0     sertraline 50 mg Tablet  Commonly known as: ZOLOFT   50 mg, Daily  Refills: 0     Vascepa 1 gram Capsule  Generic drug: icosapent ethyL   2 g, 2 TIMES DAILY  Refills: 0            Discharge med list refreshed?  YES     Allergies[1]  HOSPITAL PROCEDURE(S):   No orders of the defined types were placed in this encounter.      REASON FOR HOSPITALIZATION AND HOSPITAL COURSE   Edwin [name] is a 68 y.o. male with past medical history of CAD (5 stents), T2DM, GERD, HTN, TIA  who presented to [facility] ER  6/21 with shortness of breath. VS on arrival showed HR in the 150s and febrile to 103. Labs with lactate 3.4. CT of the chest performed after negative CXR with LUL pneumonia. Biofire with metapneumovirus. Admited to SCU for further treatment and monitoring of Sepsis 2/2 Pneumonia. Vitals and symptoms rapidly improved with antibiotics and steroids. CXR negative. O2 weaned down to 1L. Patient medically ready for d/c with home O2 for pneumonia and chronic requirement in setting of known CHF. Patient will d/c with  antibiotics and steroid taper and close PCP follow  up.       TRANSITION/POST DISCHARGE CARE/PENDING TESTS/REFERRALS:   PCP Follow Up    CONDITION ON DISCHARGE:  A. Ambulation: Full ambulation  B. Self-care Ability: Complete  C. Cognitive Status Alert and Oriented x 3  D. Code status at discharge:       LINES/DRAINS/WOUNDS AT DISCHARGE:   Patient Lines/Drains/Airways Status       Active Line / Dialysis Catheter / Dialysis Graft / Drain / Airway / Wound       Name Placement date Placement time Site Days    Peripheral IV Left Cephalic  (lateral side of arm) 10/10/23  1420  -- 2                    DISCHARGE DISPOSITION:  Home discharge  DISCHARGE INSTRUCTIONS:  Post-Discharge Follow Up Appointments       Follow up with Froylan Jester, MD    Phone: [phone]    Where: [address], [place] [address]      Friday Jan 12, 2024    Return Patient Visit with Larrie Newman NOVAK, MD at 11:00 AM      Cardiology [facility], [facility]  [facility], [place]  [address]  [place] [address]  [phone]             DME - HOME OXYGEN    Study used to evaluate oxygen saturation at rest vs exercise was performed during an inpatient hospital stay within 2 days prior to discharge date and was the last test performed prior to discharge and met the following criteria:   Study performed at rest without oxygen.  Study performed during exercise without oxygen.  Study performed during exercise with oxygen applied that demonstrates improvement.    Results of oxygen trials:                              Ht 172.7 cm    Wt 97.07 kg    Current Attending: Verdon Karma    I certify this pt is under my care as an attending physician & that I, or a collaborating ANP/PA had a face to face encounter with the pt or the durable medical power of attorney, as appropriate and explained the need for DME on the following date: 10/12/2023    The  primary diagnosis as discussed in the face to face encounter justifying the need for home health services/DME is as follows: CHF    New Patient or Renewal? New Patient    Liter flow per minute 2    RA O2 Sats at rest: 86    RA O2 Sats at Rest Date: 10/11/2023    RA O2 Sats at Rest Time: 2:00 PM    I certify that home oxygen is necessary due to the following condition  At rest, (awake but sitting or lying down) O2 sat is at or below 88%    Physician has determined that patient has a severe lung disease or hypoxia related symptoms that will improve with oxygen Yes    Start Date 10/12/2023    Type of Concentrator STATIONARY CONCENTRATOR    Type of Concentrator PORTABLE OXYGEN TANKS    Type of Concentrator OXYGEN CONTENTS    Freedom of Choice: I have informed patient of their freedom of choice with respect to DME providers    Duration of Oxygen Therapy: 24 HOURS PER DAY    Patient is mobile within the home and will require portable oxygen: Yes    Estimated Length of Need (in months; 99 mo.= lifetime) 99    Delivery Device NASAL CANNULA    Type of Oxygen Requested GAS           Verdon Karma, DO    Copies sent to Care Team         Relationship Specialty Notifications Start End    Froylan Jester, MD PCP - General FAMILY PRACTICE  05/29/20     Phone: [phone] Fax: [phone]         [address] [place] [address]            Referring providers can utilize [email or web address] to access their referred [facility] patient's information.                               [1]   Allergies  Allergen Reactions    Gabapentin  Other Adverse Reaction (Add comment)     hallucinations      Morphine  Other Adverse Reaction (Add comment)     Hallucinations      Tylenol  Pm [Diphenhydramine-Acetaminophen ]      Patient denied      Keflex [Cephalexin] Nausea/ Vomiting    Victoza [Liraglutide] Nausea/ Vomiting

## 2023-10-13 NOTE — Pharmacy (Signed)
 Counseled patient prior to discharge home on new medications. Patient is an old friend of mine so we talked a while about how they were doing and the new oxygen he would be using to help his sleep apnea. Patient didn't have any questions about his medications and was ready to go home and get out of the hospital.

## 2023-10-14 ENCOUNTER — Telehealth (HOSPITAL_COMMUNITY): Payer: Self-pay | Admitting: Family Medicine

## 2023-10-16 ENCOUNTER — Telehealth (HOSPITAL_COMMUNITY): Payer: Self-pay | Admitting: Family Medicine

## 2023-10-19 ENCOUNTER — Emergency Department
Admission: EM | Admit: 2023-10-19 | Discharge: 2023-10-19 | Disposition: A | Discharge status: Home / Self Care | Attending: FAMILY PRACTICE | Admitting: FAMILY PRACTICE

## 2023-10-19 ENCOUNTER — Telehealth (HOSPITAL_COMMUNITY): Payer: Self-pay | Admitting: Family Medicine

## 2023-10-19 ENCOUNTER — Encounter (HOSPITAL_COMMUNITY): Payer: Self-pay

## 2023-10-19 ENCOUNTER — Emergency Department (HOSPITAL_COMMUNITY)

## 2023-10-19 ENCOUNTER — Other Ambulatory Visit: Payer: Self-pay

## 2023-10-19 DIAGNOSIS — R0602 Shortness of breath: Secondary | ICD-10-CM | POA: Insufficient documentation

## 2023-10-19 DIAGNOSIS — R17 Unspecified jaundice: Secondary | ICD-10-CM | POA: Insufficient documentation

## 2023-10-19 DIAGNOSIS — R7401 Elevation of levels of liver transaminase levels: Secondary | ICD-10-CM | POA: Insufficient documentation

## 2023-10-19 DIAGNOSIS — R42 Dizziness and giddiness: Secondary | ICD-10-CM | POA: Insufficient documentation

## 2023-10-19 DIAGNOSIS — R079 Chest pain, unspecified: Secondary | ICD-10-CM

## 2023-10-19 DIAGNOSIS — R0789 Other chest pain: Secondary | ICD-10-CM | POA: Insufficient documentation

## 2023-10-19 LAB — COMPREHENSIVE METABOLIC PANEL, NON-FASTING
ALBUMIN: 3.1 g/dL — ABNORMAL LOW (ref 3.4–4.8)
ALKALINE PHOSPHATASE: 50 U/L (ref 45–115)
ALT (SGPT): 91 U/L — ABNORMAL HIGH (ref <43)
ANION GAP: 10 mmol/L (ref 4–13)
AST (SGOT): 40 U/L — ABNORMAL HIGH (ref 11–34)
BILIRUBIN TOTAL: 2.6 mg/dL — ABNORMAL HIGH (ref 0.3–1.3)
BUN/CREA RATIO: 31 — ABNORMAL HIGH (ref 6–22)
BUN: 25 mg/dL (ref 8–25)
CALCIUM: 8.9 mg/dL (ref 8.6–10.3)
CHLORIDE: 107 mmol/L (ref 96–111)
CO2 TOTAL: 29 mmol/L (ref 23–31)
CREATININE: 0.8 mg/dL (ref 0.75–1.35)
ESTIMATED GFR - MALE: >90 mL/min/BSA (ref >=60)
GLUCOSE: 80 mg/dL (ref 65–125)
POTASSIUM: 4.3 mmol/L (ref 3.5–5.1)
PROTEIN TOTAL: 6.5 g/dL (ref 6.0–8.0)
SODIUM: 146 mmol/L — ABNORMAL HIGH (ref 136–145)

## 2023-10-19 LAB — CBC WITH DIFF
BASOPHIL #: <0.04 10*3/uL (ref <=0.20)
BASOPHIL %: 0.2 %
EOSINOPHIL #: 0.18 10*3/uL (ref <=0.50)
EOSINOPHIL %: 1.5 %
HCT: 45.9 % (ref 38.9–52.0)
HGB: 15.8 g/dL (ref 13.4–17.5)
IMMATURE GRANULOCYTE #: 0.24 10*3/uL — ABNORMAL HIGH (ref <0.10)
IMMATURE GRANULOCYTE %: 2 % — ABNORMAL HIGH (ref 0.0–1.0)
LYMPHOCYTE #: 4.53 10*3/uL (ref 1.00–4.80)
LYMPHOCYTE %: 37.2 %
MCH: 29.4 pg (ref 26.0–32.0)
MCHC: 34.4 g/dL (ref 31.0–35.5)
MCV: 85.3 fL (ref 78.0–100.0)
MONOCYTE #: 1.09 10*3/uL (ref 0.20–1.10)
MONOCYTE %: 9 %
MPV: 9.2 fL (ref 8.7–12.5)
NEUTROPHIL #: 6.11 10*3/uL (ref 1.50–7.70)
NEUTROPHIL %: 50.1 %
PLATELETS: 228 10*3/uL (ref 150–400)
RBC: 5.38 10*6/uL (ref 4.50–6.10)
RDW-CV: 13.2 % (ref 11.5–15.5)
WBC: 12.2 10*3/uL — ABNORMAL HIGH (ref 3.7–11.0)

## 2023-10-19 LAB — D-DIMER: D-DIMER: 0.27 ug/mL (ref 0.22–0.52)

## 2023-10-19 LAB — GOLD TOP TUBE

## 2023-10-19 LAB — GRAY TOP TUBE

## 2023-10-19 LAB — TROPONIN-I
TROPONIN-I HS: 12.8 ng/L (ref <19.8)
TROPONIN-I HS: 11.5 ng/L (ref <19.8)

## 2023-10-19 LAB — B-TYPE NATRIURETIC PEPTIDE (BNP),PLASMA: BNP: 40 pg/mL (ref <=100)

## 2023-10-19 MED ORDER — SODIUM CHLORIDE 0.9 % (FLUSH) INJECTION SYRINGE
1.0000 mL | INJECTION | INTRAMUSCULAR | Status: DC | PRN
Start: 2023-10-19 — End: 2023-10-19

## 2023-10-19 NOTE — ED Triage Notes (Signed)
 Patient states, I was recently discharged from here for pneumonia. I was in for a week. I was sent home on antibiotics and steroids. Today I started getting chest pain and worse shortness of breath. I went to the bank to get my rent money and thought I was going to pass out while I was in there. All at once I started getting white spots in front of my eyes and got really hot. Patient reports that his chest pain is pressure like in nature and that it is on the left side of his chest. Also states that he has an insulin  pump.

## 2023-10-19 NOTE — ED Nurses Note (Signed)
 Patient discharged home. AVS reviewed with patient.  A written copy of the AVS and discharge instructions was given to the patient/care giver.  Questions sufficiently answered as needed.  Patient/care giver encouraged to follow up with PCP as indicated.  In the event of an emergency, patient/care giver instructed to call 911 or go to the nearest emergency room.

## 2023-10-19 NOTE — ED Provider Notes (Signed)
 [facility] - Emergency Department  ED Primary Provider Note  History of Present Illness   Chief Complaint   Patient presents with    Chest Pain      Edwin [name] is a 68 y.o. male who had concerns including Chest Pain .  Patient with a history of diabetes mellitus type 2, hypertension, hyperlipidemia, coronary artery disease status post stent x5, TIA, and gastroesophageal reflux disease who developed left-sided chest pain/pressure with shortness of breath and thought he was going to passed out while at the bank getting money to pay for his rent.  Patient's chest pain had improved by time he got to the emergency department.  Patient was recently admitted at [facility] for sepsis pneumonia from 10/07/2023 through 10/12/2023.  Patient was discharged home with doxycycline  and prednisone .  Patient had a follow up with his primary care physician who extended his antibiotic and steroids which he has finished.    Physical Exam   ED Triage Vitals   BP (Non-Invasive) 10/19/23 0941 128/83   Heart Rate 10/19/23 0941 77   Respiratory Rate 10/19/23 0941 20   Temperature 10/19/23 0940 36.2 C (97.1 F)   SpO2 10/19/23 0941 100 %   Weight 10/19/23 0945 97.1 kg (214 lb)   Height 10/19/23 0945 1.727 m (5' 8)     Constitutional:  68 y.o. male who appears in no distress. Normal color, no cyanosis.   HENT:   Head: Normocephalic and atraumatic.   Mouth/Throat: Oropharynx is clear and moist.   Eyes: EOMI, PERRL   Neck: Trachea midline. Neck supple.  Cardiovascular: RRR, No murmurs, rubs or gallops. Intact distal pulses.  Pulmonary/Chest: BS equal bilaterally. No respiratory distress. No wheezes, rales or chest tenderness.   Abdominal: Bowel sounds present and normal. Abdomen soft, no tenderness, no rebound and no guarding.  Back: No midline spinal tenderness, no paraspinal tenderness, no CVA tenderness.           Musculoskeletal: No edema, tenderness or deformity.  Skin: warm and dry. No rash, erythema, pallor or  cyanosis  Psychiatric: normal mood and affect. Behavior is normal.   Neurological: Patient keenly alert and responsive, easily able to raise eyebrows, facial muscles/expressions symmetric, speaking in fluent sentences, moving all extremities equally and fully, normal gait  Patient Data   Labs Ordered/Reviewed   COMPREHENSIVE METABOLIC PANEL, NON-FASTING - Abnormal; Notable for the following components:       Result Value    SODIUM 146 (*)     BUN/CREA RATIO 31 (*)     ALBUMIN 3.1 (*)     ALT (SGPT) 91 (*)     AST (SGOT)  40 (*)     BILIRUBIN TOTAL 2.6 (*)     All other components within normal limits   CBC WITH DIFF - Abnormal; Notable for the following components:    WBC 12.2 (*)     IMMATURE GRANULOCYTE % 2.0 (*)     IMMATURE GRANULOCYTE # 0.24 (*)     All other components within normal limits   TROPONIN-I - Normal    Narrative:     TROPONIN INTERPRETATIVE COMMENT:    *ERRONEOUS RESULTS MAY BE OBTAINED ON SAMPLES FROM PATIENTS WHO HAVE BEEN TREATED WITH MOUSE MONOCLONAL ANTIBODIES OR WHO HAVE RECEIVED THEM FOR DIAGNOSTIC PURPOSES.      TROPONIN I VALUES ARE OF THE GREATEST VALUE WHEN SERIALLY PERFORMED.    PRESENCE OF HETEROPHILE ANTIBODIES HAVE BEEN KNOWN TO CAUSE ERRONEOUS RESULTS.    D-DIMER -  Normal   B-TYPE NATRIURETIC PEPTIDE (BNP),PLASMA - Normal   TROPONIN-I - Normal    Narrative:     TROPONIN INTERPRETATIVE COMMENT:    *ERRONEOUS RESULTS MAY BE OBTAINED ON SAMPLES FROM PATIENTS WHO HAVE BEEN TREATED WITH MOUSE MONOCLONAL ANTIBODIES OR WHO HAVE RECEIVED THEM FOR DIAGNOSTIC PURPOSES.      TROPONIN I VALUES ARE OF THE GREATEST VALUE WHEN SERIALLY PERFORMED.    PRESENCE OF HETEROPHILE ANTIBODIES HAVE BEEN KNOWN TO CAUSE ERRONEOUS RESULTS.    CBC/DIFF    Narrative:     The following orders were created for panel order CBC/DIFF.  Procedure                               Abnormality         Status                     ---------                               -----------         ------                     CBC WITH  IPQQ[[id number]]                Abnormal            Final result                 Please view results for these tests on the individual orders.   GRAY TOP TUBE   EXTRA TUBES    Narrative:     The following orders were created for panel order EXTRA TUBES.  Procedure                               Abnormality         Status                     ---------                               -----------         ------                     GOLD TOP ULAZ[[id number]]                                    In process                 GRAY TOP ULAZ[[id number]]                                    Final result                 Please view results for these tests on the individual orders.   GOLD TOP TUBE     XR CHEST AP   Final Result by Edi, Radresults In (07/03 1007)   No acute parenchymal process.  Radiologist location ID: [facility]           Medical Decision Making        Medical Decision Making  Amount and/or Complexity of Data Reviewed  Labs: ordered. Decision-making details documented in ED Course.     Details: CBC, CMP, troponin, BNP and D-dimer  Radiology: ordered. Decision-making details documented in ED Course.     Details: Chest x-ray.  ECG/medicine tests: ordered. Decision-making details documented in ED Course.     Details: EKG    Risk  Prescription drug management.    Patient was seen for chest pain/pressure and he thought he was going to press out.    Patient's chest pain had improved by time he got to the hospital.    I discussed results of labs, EKG, and chest x-ray patient.    Labs with normal CBC.  Normal BMP.  AST and ALT slightly elevated.  T bili elevated chronic.  Troponin I-HS x2 negative.    Chest x-ray shows no acute findings.  EKG shows a sinus rhythm no acute ST/T changes.    Patient would like to go home.  Patient no longer had chest pain.    Patient was discharged home in stable condition.  Patient will follow up with his PCP in one-week, sooner if needed.  Patient will talk to his PCP about an  outpatient stress test.  Patient was advised to return to the emergency department for worsening or new symptoms especially if his chest pain recurs or if develops palpitations, pleuritic pain, shortness breath, lightheaded, dizziness, presyncope, or syncope.       HEART Score: 4    Differential diagnoses include but not limited to acute MI, valvular disease, arrhythmia, pneumonia, pneumothorax, pleurisy, CHF, pulmonary embolism         Medications Ordered/Administered in the ED   NS flush syringe (has no administration in time range)     Clinical Impression   Chest pain, unspecified type (Primary)   Shortness of breath   Elevated transaminase level   Hyperbilirubinemia       Disposition: Discharged

## 2023-10-20 DIAGNOSIS — I440 Atrioventricular block, first degree: Secondary | ICD-10-CM

## 2023-10-20 DIAGNOSIS — R9431 Abnormal electrocardiogram [ECG] [EKG]: Secondary | ICD-10-CM

## 2023-10-20 LAB — ECG 12 LEAD
Atrial Rate: 78 {beats}/min
Calculated P Axis: 40 degrees
Calculated R Axis: 28 degrees
Calculated T Axis: 45 degrees
PR Interval: 202 ms
QRS Duration: 74 ms
QT Interval: 362 ms
QTC Calculation: 412 ms
Ventricular rate: 78 {beats}/min
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
## 2024-01-12 ENCOUNTER — Encounter (INDEPENDENT_AMBULATORY_CARE_PROVIDER_SITE_OTHER): Payer: Self-pay | Admitting: Cardiovascular Disease

## 2024-01-15 ENCOUNTER — Emergency Department (HOSPITAL_COMMUNITY)

## 2024-01-15 ENCOUNTER — Encounter (HOSPITAL_COMMUNITY): Payer: Self-pay

## 2024-01-15 ENCOUNTER — Emergency Department
Admission: EM | Admit: 2024-01-15 | Discharge: 2024-01-15 | Disposition: A | Discharge status: Home / Self Care | Attending: FAMILY PRACTICE | Admitting: FAMILY PRACTICE

## 2024-01-15 DIAGNOSIS — Z981 Arthrodesis status: Secondary | ICD-10-CM | POA: Insufficient documentation

## 2024-01-15 DIAGNOSIS — E1142 Type 2 diabetes mellitus with diabetic polyneuropathy: Secondary | ICD-10-CM | POA: Insufficient documentation

## 2024-01-15 DIAGNOSIS — M546 Pain in thoracic spine: Secondary | ICD-10-CM

## 2024-01-15 DIAGNOSIS — R531 Weakness: Secondary | ICD-10-CM | POA: Insufficient documentation

## 2024-01-15 DIAGNOSIS — M542 Cervicalgia: Secondary | ICD-10-CM | POA: Insufficient documentation

## 2024-01-15 DIAGNOSIS — Z885 Allergy status to narcotic agent status: Secondary | ICD-10-CM | POA: Insufficient documentation

## 2024-01-15 LAB — COMPREHENSIVE METABOLIC PANEL, NON-FASTING
ALBUMIN: 3.9 g/dL (ref 3.4–4.8)
ALKALINE PHOSPHATASE: 87 U/L (ref 45–115)
ALT (SGPT): 24 U/L (ref <43)
ANION GAP: 9 mmol/L (ref 4–13)
AST (SGOT): 25 U/L (ref 11–34)
BILIRUBIN TOTAL: 4 mg/dL — ABNORMAL HIGH (ref 0.3–1.3)
BUN/CREA RATIO: 14 (ref 6–22)
BUN: 15 mg/dL (ref 8–25)
CALCIUM: 8.8 mg/dL (ref 8.6–10.3)
CHLORIDE: 103 mmol/L (ref 96–111)
CO2 TOTAL: 29 mmol/L (ref 23–31)
CREATININE: 1.1 mg/dL (ref 0.75–1.35)
GLUCOSE: 138 mg/dL — ABNORMAL HIGH (ref 65–125)
POTASSIUM: 3.8 mmol/L (ref 3.5–5.1)
PROTEIN TOTAL: 7.2 g/dL (ref 6.0–8.0)
SODIUM: 141 mmol/L (ref 136–145)
eGFRcr - MALE: 73 mL/min/1.73mˆ2 (ref >=60)

## 2024-01-15 LAB — CBC WITH DIFF
BASOPHIL #: 0.05 x10ˆ3/uL (ref <=0.20)
BASOPHIL %: 0.6 %
EOSINOPHIL #: 0.24 x10ˆ3/uL (ref <=0.50)
EOSINOPHIL %: 2.9 %
HCT: 45.6 % (ref 38.9–52.0)
HGB: 16.3 g/dL (ref 13.4–17.5)
IMMATURE GRANULOCYTE #: <0.04 x10ˆ3/uL (ref <0.10)
IMMATURE GRANULOCYTE %: 0.4 % (ref 0.0–1.0)
LYMPHOCYTE #: 2.69 x10ˆ3/uL (ref 1.00–4.80)
LYMPHOCYTE %: 32.5 %
MCH: 30.6 pg (ref 26.0–32.0)
MCHC: 35.7 g/dL — ABNORMAL HIGH (ref 31.0–35.5)
MCV: 85.6 fL (ref 78.0–100.0)
MONOCYTE #: 0.61 x10ˆ3/uL (ref 0.20–1.10)
MONOCYTE %: 7.4 %
MPV: 9.5 fL (ref 8.7–12.5)
NEUTROPHIL #: 4.65 x10ˆ3/uL (ref 1.50–7.70)
NEUTROPHIL %: 56.2 %
PLATELETS: 219 x10ˆ3/uL (ref 150–400)
RBC: 5.33 x10ˆ6/uL (ref 4.50–6.10)
RDW-CV: 12.4 % (ref 11.5–15.5)
WBC: 8.3 x10ˆ3/uL (ref 3.7–11.0)

## 2024-01-15 LAB — MAGNESIUM: MAGNESIUM: 2 mg/dL (ref 1.8–2.6)

## 2024-01-15 LAB — TROPONIN-I: TROPONIN-I HS: 2.9 ng/L (ref <=35.0)

## 2024-01-15 MED ORDER — KETOROLAC 30 MG/ML (1 ML) INJECTION SOLUTION
15.0000 mg | INTRAMUSCULAR | Status: AC
Start: 2024-01-15 — End: 2024-01-15
  Administered 2024-01-15: 15 mg via INTRAVENOUS
  Filled 2024-01-15: qty 1

## 2024-01-15 MED ORDER — KETOROLAC 10 MG TABLET: 10.0000 mg | ORAL_TABLET | Freq: Three times a day (TID) | ORAL | 0 refills | Status: DC | PRN

## 2024-01-15 MED ORDER — SODIUM CHLORIDE 0.9 % (FLUSH) INJECTION SYRINGE
1.0000 mL | INJECTION | INTRAMUSCULAR | Status: DC | PRN
Start: 2024-01-15 — End: 2024-01-15

## 2024-01-15 NOTE — ED Nurses Note (Signed)
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
## 2024-01-17 DIAGNOSIS — I491 Atrial premature depolarization: Secondary | ICD-10-CM

## 2024-01-17 DIAGNOSIS — I440 Atrioventricular block, first degree: Secondary | ICD-10-CM

## 2024-01-17 LAB — ECG 12 LEAD
Atrial Rate: 85 {beats}/min
Calculated P Axis: 38 degrees
Calculated R Axis: 32 degrees
Calculated T Axis: 43 degrees
PR Interval: 210 ms
QRS Duration: 76 ms
QT Interval: 344 ms
QTC Calculation: 409 ms
Ventricular rate: 85 {beats}/min

## 2024-02-13 ENCOUNTER — Other Ambulatory Visit: Payer: Self-pay

## 2024-02-13 ENCOUNTER — Ambulatory Visit

## 2024-02-13 ENCOUNTER — Encounter (INDEPENDENT_AMBULATORY_CARE_PROVIDER_SITE_OTHER): Payer: Self-pay

## 2024-02-13 VITALS — BP 154/84 | HR 88 | Temp 99.3°F | Wt 216.0 lb

## 2024-02-13 DIAGNOSIS — L84 Corns and callosities: Secondary | ICD-10-CM | POA: Insufficient documentation

## 2024-02-13 DIAGNOSIS — M79672 Pain in left foot: Secondary | ICD-10-CM | POA: Insufficient documentation

## 2024-02-13 DIAGNOSIS — E119 Type 2 diabetes mellitus without complications: Secondary | ICD-10-CM | POA: Insufficient documentation

## 2024-02-13 DIAGNOSIS — M21372 Foot drop, left foot: Secondary | ICD-10-CM | POA: Insufficient documentation

## 2024-02-13 DIAGNOSIS — M79675 Pain in left toe(s): Secondary | ICD-10-CM | POA: Insufficient documentation

## 2024-02-13 DIAGNOSIS — M79674 Pain in right toe(s): Secondary | ICD-10-CM | POA: Insufficient documentation

## 2024-02-13 DIAGNOSIS — M79671 Pain in right foot: Secondary | ICD-10-CM | POA: Insufficient documentation

## 2024-02-13 DIAGNOSIS — E1142 Type 2 diabetes mellitus with diabetic polyneuropathy: Secondary | ICD-10-CM | POA: Insufficient documentation

## 2024-02-13 NOTE — Procedures (Signed)
 [facility]  [address]  [place] [address]  Operated by [facility]  Procedure Note    Name: Edwin [name] MRN:  [id number]   Date: 02/13/2024 DOB:  Sep 13, 1955 (68 y.o.)         11057 - PARING/CUTTING OF MORE THAN 4 BENIGN HYPERKERATOTIC LESIONS (AMB ONLY)    Performed by: Kaidance Pantoja J, DPM  Authorized by: Diera Wirkkala J, DPM    Time Out:     Immediately before the procedure, a time out was called:  Yes    Patient verified:  Yes    Procedure Verified:  Yes    Site Verified:  Yes  Documentation:      All hyperkeratotic lesions as described in the clinic note physical exam were sharply pared to the level of epidermis with a # 15 blade today to patient's tolerance.  Patient has tolerated paring procedure well without any complications.  No bleeding occurred.        Brendolyn Stockley J Kimiyo Carmicheal, DPM

## 2024-02-13 NOTE — Progress Notes (Signed)
 [facility]  [address]  [place] [address]  Operated by [facility]     Name: Edwin [name] MRN:  [id number]   Date: 02/13/2024 Age: 68 y.o. 06-Jan-1956      Chief Complaint:  Referral for a torn off big toenail.    History of Present Illness:  Edwin [name] is a 68 y.o. male coming in today with Neuropathy (Patient presents today for diabetic neuropathy and a toenail issue. Patient states that he has been using antibiotic cream on his toe, patient states that the nail just came off. He states that he can not feel anything, he has previously seen a podiatrist.) and Nail Problem.  He is trying to trim his own nails, he ripped off of the right great toenail, and blood, he saw his primary care and was referred here.    Current Outpatient Medications   Medication Sig    acetaminophen  (TYLENOL ) 500 mg Oral Tablet Take 1 Tablet (500 mg total) by mouth Every 4 hours as needed for Pain    albuterol  sulfate (PROVENTIL  OR VENTOLIN  OR PROAIR ) 90 mcg/actuation Inhalation oral inhaler Take 2 Puffs by inhalation    aspirin  (ECOTRIN) 81 mg Oral Tablet, Delayed Release (E.C.) Take 1 Tablet (81 mg total) by mouth Once a day    atorvastatin  (LIPITOR) 40 mg Oral Tablet Take 2 Tablets (80 mg total) by mouth Every evening (Patient taking differently: Take 1 Tablet (40 mg total) by mouth Every evening)    benzonatate  (TESSALON ) 100 mg Oral Capsule Take 1 Capsule (100 mg total) by mouth Every 8 hours as needed for Cough (Patient not taking: Reported on 02/13/2024)    clopidogreL  (PLAVIX ) 75 mg Oral Tablet TAKE ONE TABLET ( 75 MG ) BY MOUTH ONCE DAILY    cyanocobalamin  (VITAMIN B12) 1,000 mcg/mL Injection Solution Inject 1 mL (1,000 mcg total) under the skin Every 30 days    DICLOFENAC SODIUM-MENTHOL EXT Apply topically    diphenoxylate -atropine  (LOMOTIL ) 2.5-0.025 mg Oral Tablet Take 1 Tablet by mouth Four times a day as needed    ergocalciferol , vitamin D2, (DRISDOL ) 1,250 mcg  (50,000 unit) Oral Capsule Take 1 Capsule (50,000 Units total) by mouth Every 7 days    ezetimibe  (ZETIA ) 10 mg Oral Tablet Take 1 Tablet (10 mg total) by mouth Every evening    FARXIGA  10 mg Oral Tablet TAKE ONE TABLET BY MOUTH ONCE DAILY    furosemide  (LASIX ) 20 mg Oral Tablet Take 1 Tablet (20 mg total) by mouth Once a day (Patient taking differently: Take 4 Tablets (80 mg total) by mouth Daily)    [Paused] HUMALOG  U-100 INSULIN  100 unit/mL Subcutaneous Solution Inject 100 Units under the skin Daily    ipratropium bromide  (ATROVENT ) 21 mcg (0.03 %) Nasal nasal spray Administer 2 Sprays into affected nostril(s) Three times a day    isosorbide  mononitrate (IMDUR ) 30 mg Oral Tablet Sustained Release 24 hr TAKE ONE TABLET ( 30 MG ) BY MOUTH EVERY MORNING    ketorolac  tromethamine  (TORADOL ) 10 mg Oral Tablet Take 1 Tablet (10 mg total) by mouth Three times a day as needed for Pain (Patient not taking: Reported on 02/13/2024)    lactobacillus rhamnosus, GG, (CULTURELLE) 10 billion cell Oral Capsule Take 1 Capsule by mouth Twice daily with food    lidocaine  (LIDODERM ) 5 % Adhesive Patch, Medicated Place 1 Patch (700 mg total) on the skin Daily    Magnesium 200 mg Oral Tablet Take by mouth  metoprolol  tartrate (LOPRESSOR ) 100 mg Oral Tablet TAKE 1 TABLET (100 MG TOTAL) BY MOUTH TWICE DAILY    mupirocin  (BACTROBAN ) 2 % Ointment Apply topically Twice daily    nitroGLYCERIN  (NITROSTAT ) 0.4 mg Sublingual Tablet, Sublingual DISSOLVE 1 TABLET UNDER TONGUE AT FIRST SIGN OF CHEST PAIN AS NEEDED, MAY REPEAT EVERY 5 MINS. UP TO 3 DOSES, NO MORE THAN 3 TABS IN A 15 MIN. PERIOD    ondansetron  (ZOFRAN  ODT) 4 mg Oral Tablet, Rapid Dissolve     OZEMPIC 0.25 mg or 0.5 mg (2 mg/3 mL) Subcutaneous Pen Injector     pantoprazole  (PROTONIX ) 40 mg Oral Tablet, Delayed Release (E.C.) Take 1 Tablet (40 mg total) by mouth Daily (Patient taking differently: Take 20 mg by mouth Three times a day)    polyethylene glycol (MIRALAX ) 17 gram Oral  Powder in Packet Take 1 Packet (17 g total) by mouth Daily    ranolazine  (RANEXA ) 500 mg Oral Tablet Sustained Release 12 hr TAKE ONE TABLET ( 500 MG ) BY MOUTH TWICE DAILY    rOPINIRole  (REQUIP ) 0.5 mg Oral Tablet Take 1 Tablet (0.5 mg total) by mouth Three times a day    sertraline  (ZOLOFT ) 50 mg Oral Tablet Take 1 Tablet (50 mg total) by mouth Daily    VASCEPA  1 gram Oral Capsule Take 2 Capsules (2 g total) by mouth Twice daily     Allergies[1]    Review of Systems:  Pertinent ROS as discussed in HPI   Physical Exam:  BP (!) 154/84   Pulse 88   Temp 37.4 C (99.3 F)   Wt 98 kg (216 lb)   SpO2 93%   BMI 32.84 kg/m        OBJECTIVE:  The patient is alert and oriented with good attention to body habitus    Vascular:   Dorsalis pedis and posterior tibial pulses palpable b/l.   Capillary Fill time < 3 seconds to digits 1-5 B/L  Skin temperature warm to warm tibial tuberosity to the digits B/L  No pedal edema, minimal lower extremity varicosities.      Neurological:   Gross sensation/light touch are diminished to bilateral feet/toes. Protective sensation is absent to all 10 pedal locations b/l via SWMF    Dermatological:   Skin appears well hydrated and supple. Good color, texture, turgor.  Hyperkeratotic lesions x6 appreciated to bilateral feet sub 1st metatarsal head, bilateral sub 5th metatarsal head, right medial hallux and right plantar aspect of the 2nd metatarsal head.  Webspaces clean and dry 1-4 b/l.  Toenails 1 through 5 of the left foot and toenails 2 through 5 of the right foot are elongated.  Right great toenail absent due to recent accidental removal.  Site of incidental great toenail removal of the right foot, appears noninfected and healed well.  There was no erythema or edema surrounding the area, no drainage.    Musculoskeletal/Orthopaedic:   Structurally within normal limits  Left foot decrease muscle strength testing, eversion, dorsiflexion decreased to 4/5 strength with a mild drop foot of  the left lower extremity on gait analysis.  Otherwise +5/5 muscle strength Dorsiflexion, Plantarflexion, Inversion, Eversion B/L  ROM of the 1st MTP joint is limited b/l.  ROM of the MTJ/STJ is full without pain or crepitus b/l.  Ankle joint ROM is decreased  B/L       Assessment:  Specific diagnoses  ENCOUNTER DIAGNOSES     ICD-10-CM   1. Type 2 diabetes mellitus  E11.9  2. Diabetic peripheral neuropathy associated with type 2 diabetes mellitus (CMS HCC)  E11.42   3. 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 Pain in toes of both feet  M79.674    M79.675   5. Pain in both feet  M79.671    M79.672   6. Corns and callosities  L84        Plan:  Comprehensive bilateral diabetic foot exam performed.    Patient was educated on diabetes, neuropathy affecting his feet risks, complications, risk factors, he was given a written guide for diabetic foot care and hygiene.    Toenails debrided.  See procedure note.    Hyperkeratotic lesions x6 sharply pared.  See procedure note.    He was counseled over signs and symptoms of infection and call immediately if he is experiencing these.    He was counseled over issues that can cause drop foot, he says he does have history of spinal issues and surgeries and radiculopathy.    He was offered bracing referral for the left drop foot, he states he does have brace currently with a he had made [place], he may want referral soon as he is about 5 years into having this current brace.  Return in 3 months for continued management, sooner with any issues.    Orders  Orders Placed This Encounter    11057 - PARING/CUTTING OF MORE THAN 4 BENIGN HYPERKERATOTIC LESIONS (AMB ONLY)    11721 - DEBRIDEMENT OF NAIL BY ANY METHOD; 6 OR MORE (AMB ONLY)       See dictated note.       Cristiano Capri J Tyreke Kaeser, DPM           [1]   Allergies  Allergen Reactions    Gabapentin  Other Adverse Reaction (Add comment)     hallucinations      Morphine  Other Adverse Reaction (Add comment)     Hallucinations      Tylenol  Pm  [Diphenhydramine-Acetaminophen ]      Patient denied      Keflex [Cephalexin] Nausea/ Vomiting    Victoza [Liraglutide] Nausea/ Vomiting

## 2024-02-13 NOTE — Procedures (Signed)
 [facility]  [address]  [place] [address]  Operated by [facility]  Procedure Note    Name: Adan Baehr MRN:  [id number]   Date: 02/13/2024 DOB:  [birth year] (68 y.o.)         11721 - DEBRIDEMENT OF NAIL BY ANY METHOD; 6 OR MORE (AMB ONLY)    Performed by: Lakyia Behe J, DPM  Authorized by: Jabril Pursell J, DPM    Time Out:     Immediately before the procedure, a time out was called:  Yes    Patient verified:  Yes    Procedure Verified:  Yes    Site Verified:  Yes  Documentation:      9 toenails were manually debrided in both thickness and in length with a nail nipper to patient's tolerance.  No bleeding occurred.        Zita Ozimek J Maalik Pinn, DPM

## 2024-02-20 ENCOUNTER — Emergency Department
Admission: EM | Admit: 2024-02-20 | Discharge: 2024-02-20 | Disposition: A | Discharge status: Home / Self Care | Attending: FAMILY PRACTICE | Admitting: FAMILY PRACTICE

## 2024-02-20 ENCOUNTER — Encounter (HOSPITAL_COMMUNITY): Payer: Self-pay

## 2024-02-20 ENCOUNTER — Other Ambulatory Visit: Payer: Self-pay

## 2024-02-20 ENCOUNTER — Emergency Department (HOSPITAL_COMMUNITY)

## 2024-02-20 DIAGNOSIS — R519 Headache, unspecified: Secondary | ICD-10-CM

## 2024-02-20 DIAGNOSIS — Z8673 Personal history of transient ischemic attack (TIA), and cerebral infarction without residual deficits: Secondary | ICD-10-CM

## 2024-02-20 DIAGNOSIS — R110 Nausea: Secondary | ICD-10-CM | POA: Insufficient documentation

## 2024-02-20 LAB — COMPREHENSIVE METABOLIC PANEL, NON-FASTING
ALBUMIN: 3.8 g/dL (ref 3.4–4.8)
ALKALINE PHOSPHATASE: 75 U/L (ref 45–115)
ALT (SGPT): 25 U/L (ref <43)
ANION GAP: 10 mmol/L (ref 4–13)
AST (SGOT): 30 U/L (ref 11–34)
BILIRUBIN TOTAL: 3.6 mg/dL — ABNORMAL HIGH (ref 0.3–1.3)
BUN/CREA RATIO: 14 (ref 6–22)
BUN: 13 mg/dL (ref 8–25)
CALCIUM: 8.7 mg/dL (ref 8.6–10.3)
CHLORIDE: 105 mmol/L (ref 96–111)
CO2 TOTAL: 26 mmol/L (ref 23–31)
CREATININE: 0.9 mg/dL (ref 0.75–1.35)
GLUCOSE: 173 mg/dL — ABNORMAL HIGH (ref 65–125)
POTASSIUM: 4.1 mmol/L (ref 3.5–5.1)
PROTEIN TOTAL: 6.8 g/dL (ref 6.0–8.0)
SODIUM: 141 mmol/L (ref 136–145)
eGFRcr - MALE: >90 mL/min/1.73mˆ2 (ref >=60)

## 2024-02-20 LAB — CBC WITH DIFF
BASOPHIL #: 0.04 x10ˆ3/uL (ref <=0.20)
BASOPHIL %: 0.6 %
EOSINOPHIL #: 0.15 x10ˆ3/uL (ref <=0.50)
EOSINOPHIL %: 2.3 %
HCT: 42.8 % (ref 38.9–52.0)
HGB: 15.1 g/dL (ref 13.4–17.5)
IMMATURE GRANULOCYTE #: <0.04 x10ˆ3/uL (ref <0.10)
IMMATURE GRANULOCYTE %: 0.3 % (ref 0.0–1.0)
LYMPHOCYTE #: 1.86 x10ˆ3/uL (ref 1.00–4.80)
LYMPHOCYTE %: 28.4 %
MCH: 30 pg (ref 26.0–32.0)
MCHC: 35.3 g/dL (ref 31.0–35.5)
MCV: 84.9 fL (ref 78.0–100.0)
MONOCYTE #: 0.4 x10ˆ3/uL (ref 0.20–1.10)
MONOCYTE %: 6.1 %
MPV: 8.7 fL (ref 8.7–12.5)
NEUTROPHIL #: 4.09 x10ˆ3/uL (ref 1.50–7.70)
NEUTROPHIL %: 62.3 %
PLATELETS: 178 x10ˆ3/uL (ref 150–400)
RBC: 5.04 x10ˆ6/uL (ref 4.50–6.10)
RDW-CV: 12 % (ref 11.5–15.5)
WBC: 6.6 x10ˆ3/uL (ref 3.7–11.0)

## 2024-02-20 LAB — SEDIMENTATION RATE: ERYTHROCYTE SEDIMENTATION RATE (ESR): 2 mm/h (ref <20)

## 2024-02-20 LAB — MAGNESIUM: MAGNESIUM: 1.9 mg/dL (ref 1.8–2.6)

## 2024-02-20 MED ORDER — KETOROLAC 30 MG/ML (1 ML) INJECTION SOLUTION
15.0000 mg | INTRAMUSCULAR | Status: AC
Start: 2024-02-20 — End: 2024-02-20
  Administered 2024-02-20: 15 mg via INTRAVENOUS
  Filled 2024-02-20: qty 1

## 2024-02-20 MED ORDER — SODIUM CHLORIDE 0.9 % (FLUSH) INJECTION SYRINGE
1.0000 mL | INJECTION | INTRAMUSCULAR | Status: DC | PRN
Start: 2024-02-20 — End: 2024-02-21

## 2024-02-20 MED ORDER — ONDANSETRON HCL (PF) 4 MG/2 ML INJECTION SOLUTION
4.0000 mg | INTRAMUSCULAR | Status: AC
Start: 2024-02-20 — End: 2024-02-20
  Administered 2024-02-20: 4 mg via INTRAVENOUS
  Filled 2024-02-20: qty 2

## 2024-02-20 MED ORDER — HYDROCODONE 5 MG-ACETAMINOPHEN 325 MG TABLET
1.0000 | ORAL_TABLET | ORAL | Status: AC
Start: 2024-02-20 — End: 2024-02-20
  Administered 2024-02-20: 1 via ORAL
  Filled 2024-02-20: qty 1

## 2024-02-20 NOTE — ED Triage Notes (Signed)
 EMS called to patient's residence for a headache started approximately an hour prior. Patient states that he took Tylenol  and didn't help. Patient states that his pain is located frontal into left eye and worsens with light. Patient stated he felt nauseous. EMS administered 4 mg Zofran  ODT. A&O x3.
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
## 2024-02-20 NOTE — ED Nurses Note (Signed)

## 2024-02-23 ENCOUNTER — Other Ambulatory Visit (INDEPENDENT_AMBULATORY_CARE_PROVIDER_SITE_OTHER): Payer: Self-pay | Admitting: Family

## 2024-02-23 ENCOUNTER — Other Ambulatory Visit (INDEPENDENT_AMBULATORY_CARE_PROVIDER_SITE_OTHER): Payer: Self-pay

## 2024-02-23 ENCOUNTER — Other Ambulatory Visit (INDEPENDENT_AMBULATORY_CARE_PROVIDER_SITE_OTHER): Payer: Self-pay | Admitting: Cardiovascular Disease

## 2024-02-23 DIAGNOSIS — I679 Cerebrovascular disease, unspecified: Secondary | ICD-10-CM

## 2024-02-23 DIAGNOSIS — I2510 Atherosclerotic heart disease of native coronary artery without angina pectoris: Secondary | ICD-10-CM

## 2024-02-23 DIAGNOSIS — I639 Cerebral infarction, unspecified: Secondary | ICD-10-CM

## 2024-02-23 DIAGNOSIS — I10 Essential (primary) hypertension: Secondary | ICD-10-CM

## 2024-02-23 NOTE — Telephone Encounter (Signed)
 Last office visit 06/21/2023

## 2024-03-10 ENCOUNTER — Encounter (HOSPITAL_COMMUNITY): Payer: Self-pay

## 2024-03-10 ENCOUNTER — Emergency Department: Admission: EM | Admit: 2024-03-10 | Discharge: 2024-03-11 | Disposition: A

## 2024-03-10 ENCOUNTER — Emergency Department (HOSPITAL_COMMUNITY)

## 2024-03-10 ENCOUNTER — Other Ambulatory Visit: Payer: Self-pay

## 2024-03-10 DIAGNOSIS — R1110 Vomiting, unspecified: Secondary | ICD-10-CM

## 2024-03-10 DIAGNOSIS — K5730 Diverticulosis of large intestine without perforation or abscess without bleeding: Secondary | ICD-10-CM

## 2024-03-10 DIAGNOSIS — E869 Volume depletion, unspecified: Secondary | ICD-10-CM

## 2024-03-10 DIAGNOSIS — A0811 Acute gastroenteropathy due to Norwalk agent: Secondary | ICD-10-CM

## 2024-03-10 LAB — CBC WITH DIFF
BASOPHIL #: <0.04 x10ˆ3/uL (ref <=0.20)
BASOPHIL %: 0.2 %
EOSINOPHIL #: 0.16 x10ˆ3/uL (ref <=0.50)
EOSINOPHIL %: 1.6 %
HCT: 47.7 % (ref 38.9–52.0)
HGB: 16.6 g/dL (ref 13.4–17.5)
IMMATURE GRANULOCYTE #: <0.04 x10ˆ3/uL (ref <0.10)
IMMATURE GRANULOCYTE %: 0.3 % (ref 0.0–1.0)
LYMPHOCYTE #: 0.82 x10ˆ3/uL — ABNORMAL LOW (ref 1.00–4.80)
LYMPHOCYTE %: 8.2 %
MCH: 29.7 pg (ref 26.0–32.0)
MCHC: 34.8 g/dL (ref 31.0–35.5)
MCV: 85.5 fL (ref 78.0–100.0)
MONOCYTE #: 0.41 x10ˆ3/uL (ref 0.20–1.10)
MONOCYTE %: 4.1 %
MPV: 9.9 fL (ref 8.7–12.5)
NEUTROPHIL #: 8.5 x10ˆ3/uL — ABNORMAL HIGH (ref 1.50–7.70)
NEUTROPHIL %: 85.6 %
PLATELETS: 226 x10ˆ3/uL (ref 150–400)
RBC: 5.58 x10ˆ6/uL (ref 4.50–6.10)
RDW-CV: 12.5 % (ref 11.5–15.5)
WBC: 9.9 x10ˆ3/uL (ref 3.7–11.0)

## 2024-03-10 LAB — COVID-19 ~~LOC~~ MOLECULAR LAB TESTING
INFLUENZA VIRUS TYPE A: NOT DETECTED
INFLUENZA VIRUS TYPE B: NOT DETECTED
RSV: NOT DETECTED
SARS-COV-2: NOT DETECTED

## 2024-03-10 LAB — COMPREHENSIVE METABOLIC PANEL, NON-FASTING
ALBUMIN: 4.4 g/dL (ref 3.4–4.8)
ALKALINE PHOSPHATASE: 81 U/L (ref 45–115)
ALT (SGPT): 25 U/L (ref <43)
ANION GAP: 11 mmol/L (ref 4–13)
AST (SGOT): 29 U/L (ref 11–34)
BILIRUBIN TOTAL: 4.2 mg/dL — ABNORMAL HIGH (ref 0.3–1.3)
BUN/CREA RATIO: 11 (ref 6–22)
BUN: 10 mg/dL (ref 8–25)
CALCIUM: 9.1 mg/dL (ref 8.6–10.3)
CHLORIDE: 104 mmol/L (ref 96–111)
CO2 TOTAL: 29 mmol/L (ref 23–31)
CREATININE: 0.9 mg/dL (ref 0.75–1.35)
GLUCOSE: 71 mg/dL (ref 65–125)
POTASSIUM: 4 mmol/L (ref 3.5–5.1)
PROTEIN TOTAL: 7.7 g/dL (ref 6.0–8.0)
SODIUM: 144 mmol/L (ref 136–145)
eGFRcr - MALE: >90 mL/min/1.73mˆ2 (ref >=60)

## 2024-03-10 LAB — GI PANEL BY BIOFIRE FILM ARRAY
ADENOVIRUS F 40/41: NOT DETECTED
ASTROVIRUS: NOT DETECTED
CAMPYLOBACTER: NOT DETECTED
CRYPTOSPORIDIUM: NOT DETECTED
CYCLOSPORA CAYETANENSIS: NOT DETECTED
ENTAMOEBA HISTOLYTICA: NOT DETECTED
ENTEROAGGREGATIVE E. COLI (EAEC): NOT DETECTED
ENTEROPATHOGENIC E COLI (EPEC): NOT DETECTED
ENTEROTOXIGENIC E COLI (ETEC) LT/ST: NOT DETECTED
GIARDIA LAMBLIA: NOT DETECTED
NOROVIRUS GI/GII: DETECTED — CR
PLESIOMONAS SHIGELLOIDES: NOT DETECTED
ROTAVIRUS A: NOT DETECTED
SALMONELLA SPECIES: NOT DETECTED
SAPOVIRUS: NOT DETECTED
SHIGA-LIKE TOXIN-PRODUCING E COLI (STEC) STX1/STX2: NOT DETECTED
SHIGELLA/ENTEROINVASIVE E COLI (EIEC): NOT DETECTED
VIBRIO CHOLERAE: NOT DETECTED
VIBRIO: NOT DETECTED
YERSINIA ENTEROCOLITICA: NOT DETECTED

## 2024-03-10 LAB — URINALYSIS, MACROSCOPIC
BILIRUBIN: NEGATIVE mg/dL
BLOOD: NEGATIVE mg/dL
GLUCOSE: NEGATIVE mg/dL
KETONES: NEGATIVE mg/dL
LEUKOCYTES: NEGATIVE WBCs/uL
NITRITE: NEGATIVE
PH: 7.5
PROTEIN: 30 mg/dL — AB
SPECIFIC GRAVITY: 1.01 (ref 1.005–1.030)

## 2024-03-10 LAB — URINALYSIS, MICROSCOPIC

## 2024-03-10 LAB — OCCULT BLOOD, STOOL: OCCULT BLOOD: POSITIVE — AB

## 2024-03-10 LAB — POC BLOOD GLUCOSE (RESULTS): GLUCOSE, POC: 80 mg/dL (ref 70–99)

## 2024-03-10 LAB — LIPASE: LIPASE: 45 U/L (ref 10–60)

## 2024-03-10 MED ORDER — DEXTROSE 5 % AND 0.45 % SODIUM CHLORIDE BOLUS
1000.0000 mL | Freq: Once | INTRAVENOUS | Status: AC
Start: 2024-03-10 — End: 2024-03-10
  Administered 2024-03-10: 1000 mL via INTRAVENOUS
  Administered 2024-03-10: 0 mL via INTRAVENOUS

## 2024-03-10 MED ORDER — IOPAMIDOL 370 MG IODINE/ML (76 %) INTRAVENOUS SOLUTION
50.0000 mL | INTRAVENOUS | Status: AC
Start: 2024-03-10 — End: 2024-03-10
  Administered 2024-03-10: 50 mL via INTRAVENOUS

## 2024-03-10 MED ORDER — ONDANSETRON HCL (PF) 4 MG/2 ML INJECTION SOLUTION
4.0000 mg | INTRAMUSCULAR | Status: AC
Start: 2024-03-10 — End: 2024-03-10
  Administered 2024-03-10: 4 mg via INTRAVENOUS
  Filled 2024-03-10: qty 2

## 2024-03-10 MED ORDER — METOPROLOL TARTRATE 25 MG TABLET
50.0000 mg | ORAL_TABLET | Freq: Once | ORAL | Status: AC
Start: 2024-03-11 — End: 2024-03-10
  Administered 2024-03-10: 50 mg via ORAL
  Filled 2024-03-10: qty 2

## 2024-03-10 MED ORDER — KETOROLAC 30 MG/ML (1 ML) INJECTION SOLUTION
15.0000 mg | INTRAMUSCULAR | Status: AC
Start: 2024-03-10 — End: 2024-03-10
  Administered 2024-03-10: 15 mg via INTRAVENOUS

## 2024-03-10 MED ORDER — DEXTROSE 5 % AND 0.9 % SODIUM CHLORIDE INTRAVENOUS SOLUTION
INTRAVENOUS | Status: DC
Start: 2024-03-10 — End: 2024-03-10

## 2024-03-10 MED ORDER — DEXTROSE 5% IN WATER (D5W) FLUSH BAG - 250 ML
INTRAVENOUS | Status: DC
Start: 2024-03-10 — End: 2024-03-10

## 2024-03-10 MED ORDER — SODIUM CHLORIDE 0.9 % IV BOLUS
1000.0000 mL | INJECTION | Status: DC
Start: 2024-03-10 — End: 2024-03-10

## 2024-03-10 MED ORDER — DEXTROSE 5 % AND 0.45 % SODIUM CHLORIDE INTRAVENOUS SOLUTION
INTRAVENOUS | Status: DC
Start: 2024-03-10 — End: 2024-03-11
  Administered 2024-03-11: 0 mL via INTRAVENOUS

## 2024-03-10 MED ORDER — SODIUM CHLORIDE 0.9 % (FLUSH) INJECTION SYRINGE
1.0000 mL | INJECTION | INTRAMUSCULAR | Status: DC | PRN
Start: 2024-03-10 — End: 2024-03-11

## 2024-03-10 MED ORDER — SODIUM CHLORIDE 0.9 % IV BOLUS
500.0000 mL | INJECTION | Status: AC
Start: 2024-03-10 — End: 2024-03-11
  Administered 2024-03-10: 500 mL via INTRAVENOUS
  Administered 2024-03-11: 0 mL via INTRAVENOUS

## 2024-03-10 NOTE — ED Triage Notes (Signed)
 EMS called to residence for pt N/V/D and ab pain.

## 2024-03-11 MED ORDER — ONDANSETRON 4 MG DISINTEGRATING TABLET - ED TO GO MED
1.0000 | ORAL_TABLET | ORAL | Status: AC
Start: 2024-03-11 — End: 2024-03-11
  Administered 2024-03-11: 1
  Filled 2024-03-11: qty 3

## 2024-03-11 NOTE — ED Nurses Note (Signed)

## 2024-03-11 NOTE — Discharge Instructions (Signed)
 Plenty of fluids, Zofran  for vomiting, if he feels that your condition is worsening rather than improving do not hesitate to return to the emergency department for re-evaluation

## 2024-03-14 NOTE — ED Provider Notes (Cosign Needed)
 [facility] - Emergency Department  ED Primary Note  History of Present Illness   Edwin [name] is a 68 y.o. male who had concerns including Vomiting and Fever.  Patient arrived in the emergency department today via EMS for complaints of nausea, vomiting, and diarrhea with diffuse abdominal pain.  Patient reports symptom onset was yesterday.  Reports he is tolerating only very little oral intake.  Reports repetitive episodes of vomiting with bolus vomit.  Denies hematemesis.  Reports loose watery brown stools.  Occasional bright red blood noted when wiping post bowel movement.  Reports generalized abdominal pain without any pinpoint tenderness per patient.  His vital signs revealed mild tachycardia and temperature of 99.9.  Reported that EMS administered 1 g IV Tylenol  during transport to the ER due to fever upon arrival to patient's residence.  Patient reports generalized weakness due to volume depletion and continued nausea, vomiting, diarrhea.    Physical Exam   ED Triage Vitals   BP (Non-Invasive) 03/10/24 1803 126/81   Heart Rate 03/10/24 1803 (!) 120   Respiratory Rate 03/10/24 1803 (!) 24   Temperature 03/10/24 1803 37.7 C (99.9 F)   SpO2 03/10/24 1803 99 %   Weight 03/10/24 1754 96.2 kg (212 lb)   Height 03/10/24 1754 1.727 m (5' 8)     Physical Exam  Constitutional:       General: He is not in acute distress.     Appearance: He is ill-appearing. He is not toxic-appearing or diaphoretic.   HENT:      Head: Normocephalic.      Mouth/Throat:      Mouth: Mucous membranes are dry.   Cardiovascular:      Rate and Rhythm: Regular rhythm. Tachycardia present.      Heart sounds: Normal heart sounds. No murmur heard.  Pulmonary:      Effort: Pulmonary effort is normal. No respiratory distress.      Breath sounds: Normal breath sounds. No wheezing or rhonchi.   Abdominal:      General: Abdomen is flat. Bowel sounds are normal. There is no distension.      Palpations: Abdomen is soft.       Tenderness: There is no abdominal tenderness. There is no guarding.   Musculoskeletal:         General: Normal range of motion.      Cervical back: Normal range of motion.   Skin:     General: Skin is warm and dry.      Coloration: Skin is pale.   Neurological:      General: No focal deficit present.      Mental Status: He is alert and oriented to person, place, and time. Mental status is at baseline.   Psychiatric:         Mood and Affect: Mood normal.         Behavior: Behavior normal.         Thought Content: Thought content normal.         Judgment: Judgment normal.       Patient Data   Labs Ordered/Reviewed   GI PANEL BY BIOFIRE FILM ARRAY - Abnormal; Notable for the following components:       Result Value    NOROVIRUS GI/GII Detected (*)     All other components within normal limits   COMPREHENSIVE METABOLIC PANEL, NON-FASTING - Abnormal; Notable for the following components:    BILIRUBIN TOTAL 4.2 (*)     All other  components within normal limits   CBC WITH DIFF - Abnormal; Notable for the following components:    NEUTROPHIL # 8.50 (*)     LYMPHOCYTE # 0.82 (*)     All other components within normal limits   URINALYSIS, MACROSCOPIC - Abnormal; Notable for the following components:    PROTEIN 30 (*)     All other components within normal limits    Narrative:     Protein confirmed with Sulfosalicylic Acid: 1+   OCCULT BLOOD, STOOL - Abnormal; Notable for the following components:    OCCULT BLOOD Positive (*)     All other components within normal limits   LIPASE - Normal   COVID-19 [place] MOLECULAR LAB TESTING - Normal    Narrative:     Results are for the simultaneous qualitative identification of SARS-CoV-2 (formerly 2019-nCoV), Influenza A, Influenza B virus and Respiratory Syncytial Virus (RSV) RNA. These etiologic agents are generally detectable in nasopharyngeal and nasal swabs during the ACUTE PHASE of infection. Hence, this test is intended to be performed on respiratory specimens collected from  individuals with signs and symptoms of upper and lower respiratory tract infections who meet Centers for Disease Control and Prevention (CDC) clinical and/or epidemiological criteria for Coronavirus Disease 2019 (COVID-19) testing. CDC COVID-19 criteria for testing on human specimens is available at [facility] webpage information for Healthcare Professionals: Coronavirus Disease 2019 (COVID-19). ([email or web address].gl).     False-negative results may occur if the virus has genomic mutations, insertions, deletions or rearrangements or if performed very early in the course of illness. Otherwise, negative results indicate virus specific RNA targets are not detected; however, negative results do not preclude SARS CoV2 (COVID-19) infection,  Influenza A, Influenza B or RSV infection. Results should not be used as the sole basis for patient management decisions. Negative results must be combined with clinical observations, patient history, and epidemiological information. If upper respiratory tract infection is still suspected based on exposure history together with other clinical findings, repeat testing should be considered.      Test methodology:  [employer] SARS -CoV-2 and Influenza A/B and RSV assay - real time reverse transcription polymerase chain reaction (RT-PCR) test on the [employer] LIAT analyzer [employer]).      [employer] SARS-Co-V-2 and Influenza A/B and RSV assay uses real-time reverse transcription polymerase chain reaction (RT-PCR) technology to rapidly (approximately 20 minutes)  detect and differentiate between SARS-CoV-2, Influenza A/B and Respiratory syncytial virus (RSV) from nasopharyngeal and nasal swabs.   URINALYSIS, MICROSCOPIC - Normal   POC BLOOD GLUCOSE (RESULTS) - Normal   CBC/DIFF    Narrative:     The following orders were created for panel order CBC/DIFF.  Procedure                               Abnormality         Status                     ---------                                -----------         ------                     CBC WITH IPQQ[[id number]]                Abnormal  Final result                 Please view results for these tests on the individual orders.   URINALYSIS, MACROSCOPIC AND MICROSCOPIC W/CULTURE REFLEX    Narrative:     The following orders were created for panel order URINALYSIS, MACROSCOPIC AND MICROSCOPIC W/CULTURE REFLEX.  Procedure                               Abnormality         Status                     ---------                               -----------         ------                     URINALYSIS, MACROSCOPIC[[id number]]      Abnormal            Final result               URINALYSIS, MICROSCOPIC[[id number]]      Normal              Final result                 Please view results for these tests on the individual orders.   EXTRA TUBES    Narrative:     The following orders were created for panel order EXTRA TUBES.  Procedure                               Abnormality         Status                     ---------                               -----------         ------                     BLUE TOP ULAZ[[id number]]                                    Final result               GOLD TOP ULAZ[[id number]]                                    Final result                 Please view results for these tests on the individual orders.   BLUE TOP TUBE   GOLD TOP TUBE     CT ABDOMEN PELVIS W IV CONTRAST   Final Result by Edi, Radresults In (11/23 1942)      Scattered colonic diverticulosis, currently without evidence of acute diverticulitis. Liquid stool contents within the colon, may be seen in diarrhea.               Radiologist  location ID: [id number]           Medical Decision Making        Medical Decision Making  Amount and/or Complexity of Data Reviewed  Labs: ordered.  Radiology: ordered.  ECG/medicine tests: independent interpretation performed.    Risk  Prescription drug management.      ED Course as of 03/14/24 1637   Sun Mar 10, 2024   1837  GLUCOSE, POC: 80   1930 BILIRUBIN, TOTAL(!): 4.2  Appears chronic based on trending labs.   2200 Lab reports + Norovirus per GI [facility]. Patient receiving IV fluids. Recheck temperature. Care handoff to Dr. Blair at this time. Plan for patient to DC home once fluids completed and if fever has resolved.            Medications Ordered/Administered in the ED   ondansetron  (ZOFRAN ) 2 mg/mL injection (4 mg Intravenous Given 03/10/24 1829)   D5W 1/2 NS bolus infusion 1,000 mL (0 mL Intravenous Stopped 03/10/24 1855)   iopamidol  (ISOVUE -370) 76% solution (50 mL Intravenous Given 03/10/24 1934)   ketorolac  (TORADOL ) 30 mg/mL injection (15 mg Intravenous Given 03/10/24 2137)   NS bolus infusion 500 mL (0 mL Intravenous Stopped 03/11/24 0000)   metoprolol  tartrate (LOPRESSOR ) tablet (50 mg Oral Given 03/10/24 2357)   ondansetron  (ZOFRAN  ODT) 4 mg rapid dissolve tablet - ED To Go (3 tabs per pack) (1 Package Does not apply Patient/Family Admin 03/11/24 0035)     Clinical Impression   Vomiting and diarrhea (Primary)   Norovirus   Volume depletion       Disposition: Discharged
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
## 2024-03-29 ENCOUNTER — Ambulatory Visit: Attending: Cardiovascular Disease | Admitting: Cardiovascular Disease

## 2024-03-29 ENCOUNTER — Other Ambulatory Visit: Payer: Self-pay

## 2024-03-29 ENCOUNTER — Encounter (HOSPITAL_COMMUNITY): Payer: Self-pay | Admitting: Cardiovascular Disease

## 2024-03-29 VITALS — BP 152/78 | HR 84 | Ht 68.0 in | Wt 214.9 lb

## 2024-03-29 DIAGNOSIS — E785 Hyperlipidemia, unspecified: Secondary | ICD-10-CM | POA: Insufficient documentation

## 2024-03-29 DIAGNOSIS — I10 Essential (primary) hypertension: Secondary | ICD-10-CM | POA: Insufficient documentation

## 2024-03-29 DIAGNOSIS — E119 Type 2 diabetes mellitus without complications: Secondary | ICD-10-CM

## 2024-03-29 DIAGNOSIS — I2510 Atherosclerotic heart disease of native coronary artery without angina pectoris: Secondary | ICD-10-CM | POA: Insufficient documentation

## 2024-03-29 DIAGNOSIS — G4733 Obstructive sleep apnea (adult) (pediatric): Secondary | ICD-10-CM

## 2024-03-29 DIAGNOSIS — R600 Localized edema: Secondary | ICD-10-CM

## 2024-03-29 MED ORDER — LOSARTAN 25 MG TABLET: 25.0000 mg | ORAL_TABLET | Freq: Every day | ORAL | 1 refills | Status: DC

## 2024-04-04 ENCOUNTER — Other Ambulatory Visit: Payer: Self-pay

## 2024-04-04 ENCOUNTER — Other Ambulatory Visit (HOSPITAL_COMMUNITY): Payer: Self-pay | Admitting: Family Medicine

## 2024-04-04 ENCOUNTER — Ambulatory Visit: Admission: RE | Admit: 2024-04-04 | Source: Ambulatory Visit

## 2024-04-04 DIAGNOSIS — R1084 Generalized abdominal pain: Secondary | ICD-10-CM | POA: Insufficient documentation

## 2024-04-15 ENCOUNTER — Emergency Department
Admission: EM | Admit: 2024-04-15 | Discharge: 2024-04-15 | Disposition: A | Discharge status: Home / Self Care | Attending: Family | Admitting: Family

## 2024-04-15 ENCOUNTER — Emergency Department (HOSPITAL_COMMUNITY)

## 2024-04-15 ENCOUNTER — Other Ambulatory Visit: Payer: Self-pay

## 2024-04-15 ENCOUNTER — Encounter (HOSPITAL_COMMUNITY): Payer: Self-pay

## 2024-04-15 DIAGNOSIS — R1013 Epigastric pain: Secondary | ICD-10-CM | POA: Insufficient documentation

## 2024-04-15 LAB — CBC WITH DIFF
BASOPHIL #: <0.04 x10ˆ3/uL (ref <=0.20)
BASOPHIL %: 0.5 %
EOSINOPHIL #: 0.23 x10ˆ3/uL (ref <=0.50)
EOSINOPHIL %: 3.7 %
HCT: 43.1 % (ref 38.9–52.0)
HGB: 14.7 g/dL (ref 13.4–17.5)
IMMATURE GRANULOCYTE #: <0.04 x10ˆ3/uL (ref <0.10)
IMMATURE GRANULOCYTE %: 0.3 % (ref 0.0–1.0)
LYMPHOCYTE #: 2.18 x10ˆ3/uL (ref 1.00–4.80)
LYMPHOCYTE %: 35.5 %
MCH: 29.6 pg (ref 26.0–32.0)
MCHC: 34.1 g/dL (ref 31.0–35.5)
MCV: 86.9 fL (ref 78.0–100.0)
MONOCYTE #: 0.45 x10ˆ3/uL (ref 0.20–1.10)
MONOCYTE %: 7.3 %
MPV: 9.4 fL (ref 8.7–12.5)
NEUTROPHIL #: 3.23 x10ˆ3/uL (ref 1.50–7.70)
NEUTROPHIL %: 52.7 %
PLATELETS: 174 x10ˆ3/uL (ref 150–400)
RBC: 4.96 x10ˆ6/uL (ref 4.50–6.10)
RDW-CV: 13.2 % (ref 11.5–15.5)
WBC: 6.1 x10ˆ3/uL (ref 3.7–11.0)

## 2024-04-15 LAB — COMPREHENSIVE METABOLIC PANEL, NON-FASTING
ALBUMIN: 4 g/dL (ref 3.4–4.8)
ALKALINE PHOSPHATASE: 84 U/L (ref 45–115)
ALT (SGPT): 22 U/L (ref <43)
ANION GAP: 7 mmol/L (ref 4–13)
AST (SGOT): 19 U/L (ref 11–34)
BILIRUBIN TOTAL: 2.7 mg/dL — ABNORMAL HIGH (ref 0.3–1.3)
BUN/CREA RATIO: 13 (ref 6–22)
BUN: 9 mg/dL (ref 8–25)
CALCIUM: 8.6 mg/dL (ref 8.6–10.3)
CHLORIDE: 109 mmol/L (ref 96–111)
CO2 TOTAL: 26 mmol/L (ref 23–31)
CREATININE: 0.7 mg/dL — ABNORMAL LOW (ref 0.75–1.35)
GLUCOSE: 114 mg/dL (ref 65–125)
POTASSIUM: 4.2 mmol/L (ref 3.5–5.1)
PROTEIN TOTAL: 6.8 g/dL (ref 6.0–8.0)
SODIUM: 142 mmol/L (ref 136–145)
eGFRcr - MALE: >90 mL/min/1.73mˆ2 (ref >=60)

## 2024-04-15 LAB — TROPONIN-I: TROPONIN-I HS: 7.8 ng/L (ref <=35.0)

## 2024-04-15 LAB — LIPASE: LIPASE: 19 U/L (ref 10–60)

## 2024-04-15 MED ORDER — SODIUM CHLORIDE 0.9 % (FLUSH) INJECTION SYRINGE
10.0000 mL | INJECTION | INTRAMUSCULAR | Status: AC
  Administered 2024-04-15: 10 mL via INTRAVENOUS

## 2024-04-15 MED ORDER — HYOSCYAMINE 0.125 MG/5 ML ORAL ELIXIR
10.0000 mL | ORAL_SOLUTION | ORAL | Status: AC
  Administered 2024-04-15: 10 mL via ORAL
  Filled 2024-04-15: qty 10

## 2024-04-15 MED ORDER — LIDOCAINE HCL 2 % MUCOSAL SOLUTION
15.0000 mL | Status: AC
  Administered 2024-04-15: 15 mL via ORAL
  Filled 2024-04-15: qty 15

## 2024-04-15 MED ORDER — SODIUM CHLORIDE 0.9 % (FLUSH) INJECTION SYRINGE: 1.0000 mL | INJECTION | INTRAMUSCULAR | Status: DC | PRN

## 2024-04-15 MED ORDER — IOVERSOL 350 MG IODINE/ML INTRAVENOUS SOLUTION
50.0000 mL | INTRAVENOUS | Status: AC
  Administered 2024-04-15: 50 mL via INTRAVENOUS

## 2024-04-15 MED ORDER — ALUMINUM-MAG HYDROXIDE-SIMETHICONE 200 MG-200 MG-20 MG/5 ML ORAL SUSP
30.0000 mL | ORAL | Status: AC
  Administered 2024-04-15: 30 mL via ORAL
  Filled 2024-04-15: qty 30

## 2024-04-15 MED ORDER — PANTOPRAZOLE 40 MG INTRAVENOUS SOLUTION
40.0000 mg | INTRAVENOUS | Status: AC
  Administered 2024-04-15: 40 mg via INTRAVENOUS
  Filled 2024-04-15: qty 10

## 2024-04-15 NOTE — ED Nurses Note (Signed)
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
## 2024-04-15 NOTE — ED Triage Notes (Signed)
 Patient states he is having epigastric/ chest pain that is radiating to his neck x 3 days

## 2024-04-17 DIAGNOSIS — R9431 Abnormal electrocardiogram [ECG] [EKG]: Secondary | ICD-10-CM

## 2024-04-17 LAB — ECG 12 LEAD
Atrial Rate: 88 {beats}/min
Calculated P Axis: 61 degrees
Calculated R Axis: 52 degrees
Calculated T Axis: 52 degrees
PR Interval: 208 ms
QRS Duration: 78 ms
QT Interval: 346 ms
QTC Calculation: 418 ms
Ventricular rate: 88 {beats}/min

## 2024-05-04 ENCOUNTER — Emergency Department (HOSPITAL_COMMUNITY)

## 2024-05-04 ENCOUNTER — Other Ambulatory Visit: Payer: Self-pay

## 2024-05-04 ENCOUNTER — Encounter (HOSPITAL_COMMUNITY): Payer: Self-pay

## 2024-05-04 ENCOUNTER — Inpatient Hospital Stay
Admission: EM | Admit: 2024-05-04 | Discharge: 2024-05-07 | DRG: 194 | Disposition: A | Discharge status: Other Acute Inpatient Hospital | Attending: Internal Medicine | Admitting: Internal Medicine

## 2024-05-04 ENCOUNTER — Observation Stay (HOSPITAL_COMMUNITY)

## 2024-05-04 DIAGNOSIS — Z9861 Coronary angioplasty status: Secondary | ICD-10-CM | POA: Insufficient documentation

## 2024-05-04 DIAGNOSIS — Z9641 Presence of insulin pump (external) (internal): Secondary | ICD-10-CM | POA: Insufficient documentation

## 2024-05-04 DIAGNOSIS — R0600 Dyspnea, unspecified: Secondary | ICD-10-CM | POA: Insufficient documentation

## 2024-05-04 DIAGNOSIS — K219 Gastro-esophageal reflux disease without esophagitis: Secondary | ICD-10-CM | POA: Insufficient documentation

## 2024-05-04 DIAGNOSIS — E1142 Type 2 diabetes mellitus with diabetic polyneuropathy: Secondary | ICD-10-CM | POA: Insufficient documentation

## 2024-05-04 DIAGNOSIS — I2510 Atherosclerotic heart disease of native coronary artery without angina pectoris: Secondary | ICD-10-CM | POA: Insufficient documentation

## 2024-05-04 DIAGNOSIS — G4733 Obstructive sleep apnea (adult) (pediatric): Secondary | ICD-10-CM | POA: Insufficient documentation

## 2024-05-04 DIAGNOSIS — Z8673 Personal history of transient ischemic attack (TIA), and cerebral infarction without residual deficits: Secondary | ICD-10-CM | POA: Insufficient documentation

## 2024-05-04 DIAGNOSIS — A419 Sepsis, unspecified organism: Secondary | ICD-10-CM | POA: Insufficient documentation

## 2024-05-04 DIAGNOSIS — Z7985 Long-term (current) use of injectable non-insulin antidiabetic drugs: Secondary | ICD-10-CM | POA: Insufficient documentation

## 2024-05-04 DIAGNOSIS — Z91148 Patient's other noncompliance with medication regimen for other reason: Secondary | ICD-10-CM | POA: Insufficient documentation

## 2024-05-04 DIAGNOSIS — I10 Essential (primary) hypertension: Secondary | ICD-10-CM | POA: Insufficient documentation

## 2024-05-04 DIAGNOSIS — Z794 Long term (current) use of insulin: Secondary | ICD-10-CM | POA: Insufficient documentation

## 2024-05-04 DIAGNOSIS — G2581 Restless legs syndrome: Secondary | ICD-10-CM | POA: Insufficient documentation

## 2024-05-04 DIAGNOSIS — R000 Tachycardia, unspecified: Secondary | ICD-10-CM | POA: Insufficient documentation

## 2024-05-04 DIAGNOSIS — F418 Other specified anxiety disorders: Secondary | ICD-10-CM | POA: Insufficient documentation

## 2024-05-04 DIAGNOSIS — G459 Transient cerebral ischemic attack, unspecified: Secondary | ICD-10-CM | POA: Diagnosis present

## 2024-05-04 DIAGNOSIS — J101 Influenza due to other identified influenza virus with other respiratory manifestations: Principal | ICD-10-CM | POA: Diagnosis present

## 2024-05-04 DIAGNOSIS — E559 Vitamin D deficiency, unspecified: Secondary | ICD-10-CM | POA: Insufficient documentation

## 2024-05-04 DIAGNOSIS — E785 Hyperlipidemia, unspecified: Secondary | ICD-10-CM | POA: Insufficient documentation

## 2024-05-04 LAB — CBC WITH DIFF
BASOPHIL #: <0.04 x10ˆ3/uL (ref <=0.20)
BASOPHIL %: 0.3 %
EOSINOPHIL #: 0.14 x10ˆ3/uL (ref <=0.50)
EOSINOPHIL %: 1.4 %
HCT: 47.7 % (ref 38.9–52.0)
HGB: 16.3 g/dL (ref 13.4–17.5)
IMMATURE GRANULOCYTE #: <0.04 x10ˆ3/uL (ref <0.10)
IMMATURE GRANULOCYTE %: 0.2 % (ref 0.0–1.0)
LYMPHOCYTE #: 1.05 x10ˆ3/uL (ref 1.00–4.80)
LYMPHOCYTE %: 10.7 %
MCH: 28.6 pg (ref 26.0–32.0)
MCHC: 34.2 g/dL (ref 31.0–35.5)
MCV: 83.8 fL (ref 78.0–100.0)
MONOCYTE #: 0.7 x10ˆ3/uL (ref 0.20–1.10)
MONOCYTE %: 7.2 %
MPV: 9.6 fL (ref 8.7–12.5)
NEUTROPHIL #: 7.84 x10ˆ3/uL — ABNORMAL HIGH (ref 1.50–7.70)
NEUTROPHIL %: 80.2 %
PLATELETS: 197 x10ˆ3/uL (ref 150–400)
RBC: 5.69 x10ˆ6/uL (ref 4.50–6.10)
RDW-CV: 12.7 % (ref 11.5–15.5)
WBC: 9.8 x10ˆ3/uL (ref 3.7–11.0)

## 2024-05-04 LAB — URINALYSIS, MICROSCOPIC

## 2024-05-04 LAB — URINALYSIS, MACROSCOPIC
BILIRUBIN: NEGATIVE mg/dL
GLUCOSE: NEGATIVE mg/dL
KETONES: NEGATIVE mg/dL
LEUKOCYTE ESTERASE: NEGATIVE WBCs/uL
NITRITE: NEGATIVE
PH: 6 (ref 5.0–8.0)
PROTEIN: 100 mg/dL — AB
SPECIFIC GRAVITY: 1.025 (ref 1.005–1.030)
UROBILINOGEN: 8 mg/dL — AB

## 2024-05-04 LAB — COVID-19, FLU A/B, RSV RAPID BY PCR
INFLUENZA VIRUS TYPE A: DETECTED — AB
INFLUENZA VIRUS TYPE B: NOT DETECTED
RSV: NOT DETECTED
SARS-COV-2: NOT DETECTED

## 2024-05-04 LAB — BASIC METABOLIC PANEL
ANION GAP: 11 mmol/L (ref 4–13)
BUN/CREA RATIO: 24 — ABNORMAL HIGH (ref 6–22)
BUN: 17 mg/dL (ref 8–25)
CALCIUM: 9.2 mg/dL (ref 8.6–10.3)
CHLORIDE: 104 mmol/L (ref 96–111)
CO2 TOTAL: 24 mmol/L (ref 23–31)
CREATININE: 0.7 mg/dL — ABNORMAL LOW (ref 0.75–1.35)
GLUCOSE: 96 mg/dL (ref 65–125)
POTASSIUM: 4.1 mmol/L (ref 3.5–5.1)
SODIUM: 139 mmol/L (ref 136–145)
eGFRcr - MALE: >90 mL/min/1.73mˆ2 (ref >=60)

## 2024-05-04 LAB — D-DIMER: D-DIMER: 0.37 ug/mL (ref 0.22–0.52)

## 2024-05-04 LAB — CREATINE KINASE (CK), TOTAL, SERUM OR PLASMA: CREATINE KINASE: 272 U/L — ABNORMAL HIGH (ref 45–225)

## 2024-05-04 LAB — LACTIC ACID LEVEL W/ REFLEX FOR LEVEL >2.0: LACTIC ACID: 1.6 mmol/L (ref 0.5–2.2)

## 2024-05-04 LAB — TROPONIN, LAB ISTAT: TROPONIN HS, POC: 16.8 ng/L (ref <=28.00)

## 2024-05-04 MED ORDER — CLOPIDOGREL 75 MG TABLET
75.0000 mg | ORAL_TABLET | Freq: Every day | ORAL | Status: DC
  Administered 2024-05-05 – 2024-05-07 (×3): 75 mg via ORAL
  Filled 2024-05-04 (×3): qty 1

## 2024-05-04 MED ORDER — LEVALBUTEROL 0.63 MG/3 ML SOLUTION FOR NEBULIZATION: 0.6300 mg | INHALATION_SOLUTION | RESPIRATORY_TRACT | Status: DC | PRN

## 2024-05-04 MED ORDER — ICOSAPENT ETHYL 1 GRAM CAPSULE
2.0000 g | ORAL_CAPSULE | Freq: Two times a day (BID) | ORAL | Status: DC
  Administered 2024-05-04: 0 g via ORAL
  Administered 2024-05-05 – 2024-05-07 (×5): 2 g via ORAL
  Filled 2024-05-04 (×8): qty 2

## 2024-05-04 MED ORDER — DAPAGLIFLOZIN PROPANEDIOL 10 MG TABLET
10.0000 mg | ORAL_TABLET | Freq: Every day | ORAL | Status: DC
  Administered 2024-05-05 – 2024-05-07 (×3): 10 mg via ORAL
  Filled 2024-05-04 (×3): qty 1

## 2024-05-04 MED ORDER — RANOLAZINE ER 500 MG TABLET,EXTENDED RELEASE,12 HR
500.0000 mg | ORAL_TABLET | Freq: Two times a day (BID) | ORAL | Status: DC
  Administered 2024-05-04 – 2024-05-07 (×6): 500 mg via ORAL
  Filled 2024-05-04 (×7): qty 1

## 2024-05-04 MED ORDER — FUROSEMIDE 40 MG TABLET
20.0000 mg | ORAL_TABLET | Freq: Every day | ORAL | Status: DC
  Administered 2024-05-05: 0 mg via ORAL

## 2024-05-04 MED ORDER — ELECTROLYTE-R (PH 7.4) INTRAVENOUS SOLUTION
INTRAVENOUS | Status: DC
  Administered 2024-05-05 – 2024-05-06 (×2): 0 mL via INTRAVENOUS

## 2024-05-04 MED ORDER — EZETIMIBE 10 MG TABLET
10.0000 mg | ORAL_TABLET | Freq: Every evening | ORAL | Status: DC
  Administered 2024-05-04 – 2024-05-06 (×3): 10 mg via ORAL
  Filled 2024-05-04 (×4): qty 1

## 2024-05-04 MED ORDER — LOSARTAN 25 MG TABLET
25.0000 mg | ORAL_TABLET | Freq: Every day | ORAL | Status: DC
  Administered 2024-05-05: 0 mg via ORAL

## 2024-05-04 MED ORDER — ONDANSETRON HCL (PF) 4 MG/2 ML INJECTION SOLUTION: 4.0000 mg | Freq: Four times a day (QID) | INTRAMUSCULAR | Status: DC | PRN

## 2024-05-04 MED ORDER — SODIUM CHLORIDE 0.9% FLUSH BAG - 250 ML: INTRAVENOUS | Status: DC | PRN

## 2024-05-04 MED ORDER — SODIUM CHLORIDE 0.9 % (FLUSH) INJECTION SYRINGE: 10.0000 mL | INJECTION | INTRAMUSCULAR | Status: DC | PRN

## 2024-05-04 MED ORDER — BENZONATATE 100 MG CAPSULE
100.0000 mg | ORAL_CAPSULE | Freq: Three times a day (TID) | ORAL | Status: DC | PRN
  Administered 2024-05-04 – 2024-05-06 (×4): 100 mg via ORAL
  Filled 2024-05-04 (×4): qty 1

## 2024-05-04 MED ORDER — ISOSORBIDE MONONITRATE ER 30 MG TABLET,EXTENDED RELEASE 24 HR
30.0000 mg | ORAL_TABLET | Freq: Every morning | ORAL | Status: DC
  Administered 2024-05-05 – 2024-05-07 (×3): 30 mg via ORAL
  Filled 2024-05-04 (×4): qty 1

## 2024-05-04 MED ORDER — OSELTAMIVIR 75 MG CAPSULE
75.0000 mg | ORAL_CAPSULE | Freq: Two times a day (BID) | ORAL | Status: DC
  Administered 2024-05-04 – 2024-05-07 (×6): 75 mg via ORAL
  Filled 2024-05-04 (×7): qty 1

## 2024-05-04 MED ORDER — ASPIRIN 81 MG TABLET,DELAYED RELEASE
81.0000 mg | DELAYED_RELEASE_TABLET | Freq: Every day | ORAL | Status: DC
  Administered 2024-05-05 – 2024-05-07 (×3): 81 mg via ORAL
  Filled 2024-05-04 (×3): qty 1

## 2024-05-04 MED ORDER — SODIUM CHLORIDE 0.9 % (FLUSH) INJECTION SYRINGE
10.0000 mL | INJECTION | Freq: Three times a day (TID) | INTRAMUSCULAR | Status: DC
  Administered 2024-05-04 – 2024-05-05 (×2): 10 mL
  Administered 2024-05-05: 0 mL
  Administered 2024-05-05 – 2024-05-07 (×5): 10 mL
  Administered 2024-05-07: 0 mL

## 2024-05-04 MED ORDER — SERTRALINE 50 MG TABLET
50.0000 mg | ORAL_TABLET | Freq: Every day | ORAL | Status: DC
  Administered 2024-05-05 – 2024-05-07 (×3): 50 mg via ORAL
  Filled 2024-05-04 (×3): qty 1

## 2024-05-04 MED ORDER — CHOLECALCIFEROL (VITAMIN D3) 125 MCG (5,000 UNIT) TABLET
5000.0000 [IU] | ORAL_TABLET | Freq: Every day | ORAL | Status: DC
  Administered 2024-05-05 – 2024-05-07 (×3): 5000 [IU] via ORAL
  Filled 2024-05-04 (×3): qty 1

## 2024-05-04 MED ORDER — ATORVASTATIN 40 MG TABLET
40.0000 mg | ORAL_TABLET | Freq: Every evening | ORAL | Status: DC
  Administered 2024-05-04 – 2024-05-06 (×3): 40 mg via ORAL
  Filled 2024-05-04 (×4): qty 1

## 2024-05-04 MED ORDER — PANTOPRAZOLE 40 MG TABLET,DELAYED RELEASE
40.0000 mg | DELAYED_RELEASE_TABLET | Freq: Every evening | ORAL | Status: DC
  Administered 2024-05-04 – 2024-05-06 (×3): 40 mg via ORAL
  Filled 2024-05-04 (×4): qty 1

## 2024-05-04 MED ORDER — ROPINIROLE 0.5 MG TABLET
0.5000 mg | ORAL_TABLET | Freq: Three times a day (TID) | ORAL | Status: DC
  Administered 2024-05-04 – 2024-05-06 (×7): 0.5 mg via ORAL
  Administered 2024-05-07: 0 mg via ORAL
  Administered 2024-05-07: 0.5 mg via ORAL
  Filled 2024-05-04 (×10): qty 1

## 2024-05-04 MED ORDER — ALUMINUM-MAG HYDROXIDE-SIMETHICONE 200 MG-200 MG-20 MG/5 ML ORAL SUSP: 30.0000 mL | ORAL | Status: DC | PRN

## 2024-05-04 MED ORDER — LACTOBACILLUS RHAMNOSUS GG 10 BILLION CELL CAPSULE
1.0000 | ORAL_CAPSULE | Freq: Two times a day (BID) | ORAL | Status: DC
  Administered 2024-05-05 – 2024-05-07 (×5): 1 via ORAL
  Filled 2024-05-04 (×6): qty 1

## 2024-05-04 MED ORDER — ACETAMINOPHEN 500 MG TABLET
1000.0000 mg | ORAL_TABLET | Freq: Three times a day (TID) | ORAL | Status: DC
  Administered 2024-05-04 – 2024-05-05 (×3): 1000 mg via ORAL
  Filled 2024-05-04 (×4): qty 2

## 2024-05-04 MED ORDER — ACETAMINOPHEN 325 MG TABLET
650.0000 mg | ORAL_TABLET | ORAL | Status: AC
  Administered 2024-05-04: 650 mg via ORAL
  Filled 2024-05-04: qty 2

## 2024-05-04 MED ORDER — LIDOCAINE 4 % TOPICAL PATCH
2.0000 | MEDICATED_PATCH | Freq: Every day | CUTANEOUS | Status: DC
  Administered 2024-05-05 – 2024-05-07 (×3): 2 via TRANSDERMAL
  Filled 2024-05-04 (×4): qty 2

## 2024-05-04 MED ORDER — GUAIFENESIN 100 MG/5 ML ORAL LIQUID
200.0000 mg | ORAL | Status: DC | PRN
  Administered 2024-05-06: 200 mg via ORAL
  Filled 2024-05-04: qty 10

## 2024-05-04 MED ORDER — POLYETHYLENE GLYCOL 3350 17 GRAM ORAL POWDER PACKET: 17.0000 g | Freq: Every day | ORAL | Status: DC | PRN

## 2024-05-04 MED ORDER — MELATONIN 3 MG TABLET
3.0000 mg | ORAL_TABLET | Freq: Every evening | ORAL | Status: DC | PRN
  Administered 2024-05-04 – 2024-05-06 (×2): 3 mg via ORAL
  Filled 2024-05-04 (×2): qty 1

## 2024-05-04 MED ORDER — METOPROLOL TARTRATE 50 MG TABLET
100.0000 mg | ORAL_TABLET | Freq: Two times a day (BID) | ORAL | Status: DC
  Administered 2024-05-04 – 2024-05-05 (×2): 100 mg via ORAL
  Administered 2024-05-05 – 2024-05-06 (×2): 0 mg via ORAL
  Administered 2024-05-06: 100 mg via ORAL
  Administered 2024-05-07: 50 mg via ORAL
  Filled 2024-05-04: qty 2
  Filled 2024-05-04: qty 4
  Filled 2024-05-04 (×2): qty 2
  Filled 2024-05-04: qty 4
  Filled 2024-05-04: qty 2
  Filled 2024-05-04: qty 4

## 2024-05-04 NOTE — ED Triage Notes (Signed)
 EMS was called for shortness of breath. Patient was 92% on room air. EMS placed him on 2 liters NC at 96%. Patient has a fever of 100.5 and HR os 130. Patient is nauseated and vomiting. Pt also has had a cough for the past three days.

## 2024-05-04 NOTE — ED Notes (Signed)
 Patient was a one assist to the bathroom patient does have an unsteady gate when he walks.

## 2024-05-04 NOTE — ED Provider Notes (Signed)
 [facility] - Emergency Department  ED Primary Note  History of Present Illness   Edwin [name] is a 69 y.o. male who had concerns including Flu Like Symptoms.  This is a 69 year old man with a prior history of coronary artery disease, MI, 5 stents, sepsis, pneumonia, cholecystectomy, type 2 diabetes, hypertension, neuropathy.  He is complaining of a 3 day history of a cough that is primarily dry but did produce a small amount of green sputum, fever, headache, malaise.  Complains of nausea without vomiting.  He denies other symptoms, including chest pain, abdominal pain, change in urination or defecation, or other symptoms.    Physical Exam   ED Triage Vitals   BP (Non-Invasive) 05/04/24 1557 (!) 181/85   Heart Rate 05/04/24 1557 (!) 130   Respiratory Rate 05/04/24 1557 (!) 25   Temperature 05/04/24 1557 (!) 38.6 C (101.5 F)   SpO2 05/04/24 1557 95 %   Weight 05/04/24 1554 96.2 kg (212 lb)   Height 05/04/24 1554 1.727 m (5' 8)     Constitutional:  69 y.o. male who appears in no distress. Normal color, no cyanosis.   HENT:   Head: Normocephalic and atraumatic.   Mouth/Throat: Oropharynx is clear and moist.   Eyes: EOMI, PERRL   Neck: Trachea midline. Neck supple.  Cardiovascular:  Tachycardic and regular, No murmurs, rubs or gallops. Intact distal pulses.  Pulmonary/Chest: BS equal bilaterally. No respiratory distress. No wheezes, rales or chest tenderness.   Abdominal: Bowel sounds present and normal. Abdomen soft, no tenderness, no rebound and no guarding.  Back: No midline spinal tenderness, no paraspinal tenderness, no CVA tenderness.           Musculoskeletal: No edema, tenderness or deformity.  Skin: warm and dry. No rash, erythema, pallor or cyanosis  Psychiatric: normal mood and affect. Behavior is normal.   Neurological: Patient keenly alert and responsive, easily able to raise eyebrows, facial muscles/expressions symmetric, speaking in fluent sentences, moving all extremities equally  and fully, normal gait    Patient Data   Labs Ordered/Reviewed   COVID-19, FLU A/B, RSV RAPID BY PCR - Abnormal; Notable for the following components:       Result Value    INFLUENZA VIRUS TYPE A Detected (*)     All other components within normal limits    Narrative:     Results are for the simultaneous qualitative identification of SARS-CoV-2 (formerly 2019-nCoV), Influenza A, Influenza B virus and Respiratory Syncytial Virus (RSV) RNA. These etiologic agents are generally detectable in nasopharyngeal and nasal swabs during the ACUTE PHASE of infection. Hence, this test is intended to be performed on respiratory specimens collected from individuals with signs and symptoms of upper and lower respiratory tract infections who meet Centers for Disease Control and Prevention (CDC) clinical and/or epidemiological criteria for Coronavirus Disease 2019 (COVID-19) testing. CDC COVID-19 criteria for testing on human specimens is available at [facility] webpage information for Healthcare Professionals: Coronavirus Disease 2019 (COVID-19). (affordablescrapbook.gl).     False-negative results may occur if the virus has genomic mutations, insertions, deletions or rearrangements or if performed very early in the course of illness. Otherwise, negative results indicate virus specific RNA targets are not detected; however, negative results do not preclude SARS CoV2 (COVID-19) infection,  Influenza A, Influenza B or RSV infection. Results should not be used as the sole basis for patient management decisions. Negative results must be combined with clinical observations, patient history, and epidemiological information. If upper respiratory tract infection  is still suspected based on exposure history together with other clinical findings, repeat testing should be considered.      Test methodology:  [employer] SARS -CoV-2 and Influenza A/B and RSV assay - real time reverse transcription polymerase chain reaction  (RT-PCR) test on the [employer] LIAT analyzer [employer]).      [employer] SARS-Co-V-2 and Influenza A/B and RSV assay uses real-time reverse transcription polymerase chain reaction (RT-PCR) technology to rapidly (approximately 20 minutes)  detect and differentiate between SARS-CoV-2, Influenza A/B and Respiratory syncytial virus (RSV) from nasopharyngeal and nasal swabs.   BASIC METABOLIC PANEL - Abnormal; Notable for the following components:    CREATININE 0.70 (*)     BUN/CREA RATIO 24 (*)     All other components within normal limits   CBC WITH DIFF - Abnormal; Notable for the following components:    NEUTROPHIL # 7.84 (*)     All other components within normal limits   LACTIC ACID LEVEL W/ REFLEX FOR LEVEL >2.0 - Normal   D-DIMER - Normal   TROPONIN, LAB ISTAT - Normal   ADULT ROUTINE BLOOD CULTURE, SET OF 2 BOTTLES (BACTERIA AND YEAST)   CBC/DIFF    Narrative:     The following orders were created for panel order CBC/DIFF.  Procedure                               Abnormality         Status                     ---------                               -----------         ------                     CBC WITH IPQQ[[id number]]                Abnormal            Final result                 Please view results for these tests on the individual orders.   EXTRA TUBES    Narrative:     The following orders were created for panel order EXTRA TUBES.  Procedure                               Abnormality         Status                     ---------                               -----------         ------                     BLUE TOP ULAZ[[id number]]                                    In process  GOLD TOP ULAZ[[id number]]                                    In process                   Please view results for these tests on the individual orders.   BLUE TOP TUBE   GOLD TOP TUBE   URINALYSIS, MACROSCOPIC AND MICROSCOPIC W/CULTURE REFLEX    Narrative:     The following orders were created for panel order URINALYSIS,  MACROSCOPIC AND MICROSCOPIC W/CULTURE REFLEX.  Procedure                               Abnormality         Status                     ---------                               -----------         ------                     URINALYSIS, MACROSCOPIC[[id number]]                                                     URINALYSIS, MICROSCOPIC[[id number]]                                                       Please view results for these tests on the individual orders.   URINALYSIS, MACROSCOPIC   URINALYSIS, MICROSCOPIC     XR AP MOBILE CHEST   ED Interpretation by Tanda Aran, MD (01/17 1653)   Elevated right hemidiaphragm.  No acute infiltrate      Final Result by Edi, Radresults In (01/17 1712)   1. No acute process, showing no change from 04/15/2024.   2. Moderate chronic elevation of the right diaphragm.         Radiologist location ID: [facility]           Medical Decision Making        Medical Decision Making  Amount and/or Complexity of Data Reviewed  Labs: ordered.  Radiology: ordered and independent interpretation performed.  ECG/medicine tests: ordered.    Risk  OTC drugs.  Decision regarding hospitalization.    Differential diagnosis includes viral syndrome, COVID-19, influenza, RSV, pneumonia, sepsis  ED Course as of 05/04/24 1857   Sat May 04, 2024   1631 EKG, interpreted by me, indication dysrhythmia:  Sinus tachycardia with a rate of 132 beats per minute, no acute ST-T changes, normal axis 43   1700 Results discussed with the patient.  Calling Dr. Sonny- requests D-dimer, and we will come see him            Medications Ordered/Administered in the ED   acetaminophen  (TYLENOL ) tablet (has no administration in time range)     Clinical Impression   Influenza A (Primary)  Sepsis, due to unspecified organism, unspecified whether acute organ dysfunction present (CMS [facility])       Disposition: Admitted

## 2024-05-04 NOTE — ED Notes (Signed)
 Urinal given to patient to obtain a UA to send to lab.

## 2024-05-04 NOTE — ED Notes (Signed)
 Contacted medsurg to make aware of admission. Spoke to Alyssa.

## 2024-05-04 NOTE — ED Notes (Signed)
Patient is still not able to provide a urine sample at this time.
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
## 2024-05-04 NOTE — ED Nurses Note (Signed)
 Corean, tech, notified dwing of admission and spoke with Mardy, CHARITY FUNDRAISER.

## 2024-05-05 DIAGNOSIS — F32A Depression, unspecified: Secondary | ICD-10-CM | POA: Diagnosis present

## 2024-05-05 DIAGNOSIS — I10 Essential (primary) hypertension: Secondary | ICD-10-CM | POA: Diagnosis present

## 2024-05-05 DIAGNOSIS — Z7984 Long term (current) use of oral hypoglycemic drugs: Secondary | ICD-10-CM

## 2024-05-05 DIAGNOSIS — J102 Influenza due to other identified influenza virus with gastrointestinal manifestations: Secondary | ICD-10-CM | POA: Diagnosis present

## 2024-05-05 DIAGNOSIS — E559 Vitamin D deficiency, unspecified: Secondary | ICD-10-CM | POA: Diagnosis present

## 2024-05-05 DIAGNOSIS — G2581 Restless legs syndrome: Secondary | ICD-10-CM | POA: Diagnosis present

## 2024-05-05 DIAGNOSIS — E785 Hyperlipidemia, unspecified: Secondary | ICD-10-CM | POA: Diagnosis present

## 2024-05-05 DIAGNOSIS — G459 Transient cerebral ischemic attack, unspecified: Secondary | ICD-10-CM | POA: Diagnosis not present

## 2024-05-05 DIAGNOSIS — Z7982 Long term (current) use of aspirin: Secondary | ICD-10-CM

## 2024-05-05 DIAGNOSIS — F419 Anxiety disorder, unspecified: Secondary | ICD-10-CM | POA: Diagnosis present

## 2024-05-05 DIAGNOSIS — J101 Influenza due to other identified influenza virus with other respiratory manifestations: Principal | ICD-10-CM | POA: Diagnosis present

## 2024-05-05 DIAGNOSIS — I959 Hypotension, unspecified: Secondary | ICD-10-CM | POA: Diagnosis not present

## 2024-05-05 DIAGNOSIS — J209 Acute bronchitis, unspecified: Secondary | ICD-10-CM | POA: Diagnosis present

## 2024-05-05 DIAGNOSIS — Z7902 Long term (current) use of antithrombotics/antiplatelets: Secondary | ICD-10-CM

## 2024-05-05 DIAGNOSIS — G4733 Obstructive sleep apnea (adult) (pediatric): Secondary | ICD-10-CM | POA: Diagnosis present

## 2024-05-05 DIAGNOSIS — Z9641 Presence of insulin pump (external) (internal): Secondary | ICD-10-CM | POA: Diagnosis present

## 2024-05-05 DIAGNOSIS — Z1152 Encounter for screening for COVID-19: Secondary | ICD-10-CM

## 2024-05-05 DIAGNOSIS — I252 Old myocardial infarction: Secondary | ICD-10-CM

## 2024-05-05 DIAGNOSIS — Z955 Presence of coronary angioplasty implant and graft: Secondary | ICD-10-CM

## 2024-05-05 DIAGNOSIS — Z91199 Patient's noncompliance with other medical treatment and regimen due to unspecified reason: Secondary | ICD-10-CM

## 2024-05-05 DIAGNOSIS — Z8673 Personal history of transient ischemic attack (TIA), and cerebral infarction without residual deficits: Secondary | ICD-10-CM

## 2024-05-05 DIAGNOSIS — E871 Hypo-osmolality and hyponatremia: Secondary | ICD-10-CM | POA: Diagnosis not present

## 2024-05-05 DIAGNOSIS — K219 Gastro-esophageal reflux disease without esophagitis: Secondary | ICD-10-CM | POA: Diagnosis present

## 2024-05-05 DIAGNOSIS — Z794 Long term (current) use of insulin: Secondary | ICD-10-CM

## 2024-05-05 DIAGNOSIS — E1140 Type 2 diabetes mellitus with diabetic neuropathy, unspecified: Secondary | ICD-10-CM | POA: Diagnosis present

## 2024-05-05 DIAGNOSIS — I2510 Atherosclerotic heart disease of native coronary artery without angina pectoris: Secondary | ICD-10-CM | POA: Diagnosis present

## 2024-05-05 DIAGNOSIS — Z79899 Other long term (current) drug therapy: Secondary | ICD-10-CM

## 2024-05-05 LAB — CBC WITH DIFF
BASOPHIL #: <0.04 x10ˆ3/uL (ref <=0.20)
BASOPHIL #: <0.04 x10ˆ3/uL (ref <=0.20)
BASOPHIL %: 0.3 %
BASOPHIL %: 0.2 %
EOSINOPHIL #: <0.04 x10ˆ3/uL (ref <=0.50)
EOSINOPHIL #: <0.04 x10ˆ3/uL (ref <=0.50)
EOSINOPHIL %: 0.1 %
EOSINOPHIL %: 0 %
HCT: 45.7 % (ref 38.9–52.0)
HCT: 41.5 % (ref 38.9–52.0)
HGB: 15.3 g/dL (ref 13.4–17.5)
HGB: 14.5 g/dL (ref 13.4–17.5)
IMMATURE GRANULOCYTE #: <0.04 x10ˆ3/uL (ref <0.10)
IMMATURE GRANULOCYTE #: <0.04 x10ˆ3/uL (ref <0.10)
IMMATURE GRANULOCYTE %: 0.3 % (ref 0.0–1.0)
IMMATURE GRANULOCYTE %: 0.3 % (ref 0.0–1.0)
LYMPHOCYTE #: 1.28 x10ˆ3/uL (ref 1.00–4.80)
LYMPHOCYTE #: 0.69 x10ˆ3/uL — ABNORMAL LOW (ref 1.00–4.80)
LYMPHOCYTE %: 11 %
LYMPHOCYTE %: 6.1 %
MCH: 28.4 pg (ref 26.0–32.0)
MCH: 29.8 pg (ref 26.0–32.0)
MCHC: 33.5 g/dL (ref 31.0–35.5)
MCHC: 34.9 g/dL (ref 31.0–35.5)
MCV: 84.8 fL (ref 78.0–100.0)
MCV: 85.2 fL (ref 78.0–100.0)
MONOCYTE #: 1.03 x10ˆ3/uL (ref 0.20–1.10)
MONOCYTE #: 0.59 x10ˆ3/uL (ref 0.20–1.10)
MONOCYTE %: 8.9 %
MONOCYTE %: 5.2 %
MPV: 10.1 fL (ref 8.7–12.5)
MPV: 9.4 fL (ref 8.7–12.5)
NEUTROPHIL #: 9.25 x10ˆ3/uL — ABNORMAL HIGH (ref 1.50–7.70)
NEUTROPHIL #: 9.93 x10ˆ3/uL — ABNORMAL HIGH (ref 1.50–7.70)
NEUTROPHIL %: 79.4 %
NEUTROPHIL %: 88.2 %
PLATELETS: 159 x10ˆ3/uL (ref 150–400)
PLATELETS: 140 x10ˆ3/uL — ABNORMAL LOW (ref 150–400)
RBC: 5.39 x10ˆ6/uL (ref 4.50–6.10)
RBC: 4.87 x10ˆ6/uL (ref 4.50–6.10)
RDW-CV: 12.8 % (ref 11.5–15.5)
RDW-CV: 13 % (ref 11.5–15.5)
WBC: 11.6 x10ˆ3/uL — ABNORMAL HIGH (ref 3.7–11.0)
WBC: 11.3 x10ˆ3/uL — ABNORMAL HIGH (ref 3.7–11.0)

## 2024-05-05 LAB — TROPONIN, LAB ISTAT
TROPONIN HS, POC: 45.2 ng/L (ref <=28.00)
TROPONIN HS, POC: 57.7 ng/L (ref <=28.00)

## 2024-05-05 LAB — COMPREHENSIVE METABOLIC PANEL, NON-FASTING
ALBUMIN: 3.8 g/dL (ref 3.4–4.8)
ALKALINE PHOSPHATASE: 91 U/L (ref 45–115)
ALT (SGPT): 23 U/L (ref <43)
ANION GAP: 9 mmol/L (ref 4–13)
AST (SGOT): 35 U/L — ABNORMAL HIGH (ref 11–34)
BILIRUBIN TOTAL: 7.1 mg/dL — ABNORMAL HIGH (ref 0.3–1.3)
BUN/CREA RATIO: 24 — ABNORMAL HIGH (ref 6–22)
BUN: 22 mg/dL (ref 8–25)
CALCIUM: 8.5 mg/dL — ABNORMAL LOW (ref 8.6–10.3)
CHLORIDE: 98 mmol/L (ref 96–111)
CO2 TOTAL: 24 mmol/L (ref 23–31)
CREATININE: 0.9 mg/dL (ref 0.75–1.35)
GLUCOSE: 83 mg/dL (ref 65–125)
POTASSIUM: 4.3 mmol/L (ref 3.5–5.1)
PROTEIN TOTAL: 7 g/dL (ref 6.0–8.0)
SODIUM: 131 mmol/L — ABNORMAL LOW (ref 136–145)
eGFRcr - MALE: >90 mL/min/1.73mˆ2 (ref >=60)

## 2024-05-05 LAB — LACTIC ACID LEVEL W/ REFLEX FOR LEVEL >2.0: LACTIC ACID: 1.1 mmol/L (ref 0.5–2.2)

## 2024-05-05 LAB — POC BLOOD GLUCOSE (RESULTS)
GLUCOSE, POC: 91 mg/dL (ref 70–99)
GLUCOSE, POC: 89 mg/dL (ref 70–99)
GLUCOSE, POC: 106 mg/dL — ABNORMAL HIGH (ref 70–99)
GLUCOSE, POC: 96 mg/dL (ref 70–99)
GLUCOSE, POC: 83 mg/dL (ref 70–99)

## 2024-05-05 LAB — C-REACTIVE PROTEIN(CRP),INFLAMMATION
CRP INFLAMMATION: 37.8 mg/L — ABNORMAL HIGH (ref <8.0)
CRP INFLAMMATION: 70.6 mg/L — ABNORMAL HIGH (ref <8.0)

## 2024-05-05 LAB — MAGNESIUM: MAGNESIUM: 1.6 mg/dL — ABNORMAL LOW (ref 1.8–2.6)

## 2024-05-05 MED ORDER — SODIUM CHLORIDE 0.9 % IV BOLUS
1000.0000 mL | INJECTION | Freq: Once | Status: AC
  Administered 2024-05-05: 1000 mL via INTRAVENOUS
  Administered 2024-05-05: 0 mL via INTRAVENOUS

## 2024-05-05 MED ORDER — WATER FOR INJECTION, STERILE INJECTION SOLUTION: 1.0000 g | Freq: Three times a day (TID) | INTRAVENOUS | Status: DC

## 2024-05-05 MED ORDER — DOXYCYCLINE HYCLATE 100 MG TABLET
100.0000 mg | ORAL_TABLET | Freq: Two times a day (BID) | ORAL | Status: DC
  Administered 2024-05-05 – 2024-05-07 (×4): 100 mg via ORAL
  Filled 2024-05-05 (×6): qty 1

## 2024-05-05 MED ORDER — LEVALBUTEROL 0.63 MG/3 ML SOLUTION FOR NEBULIZATION
0.6300 mg | INHALATION_SOLUTION | Freq: Three times a day (TID) | RESPIRATORY_TRACT | Status: DC
  Administered 2024-05-05 – 2024-05-07 (×6): 0.63 mg via RESPIRATORY_TRACT
  Administered 2024-05-07: 0 mg via RESPIRATORY_TRACT
  Filled 2024-05-05: qty 1

## 2024-05-05 MED ORDER — METHYLPREDNISOLONE SOD SUCCINATE 40 MG/ML SOLUTION FOR INJ. WRAPPER
20.0000 mg | Freq: Two times a day (BID) | INTRAMUSCULAR | Status: DC
  Administered 2024-05-05: 20 mg via INTRAVENOUS
  Filled 2024-05-05: qty 1

## 2024-05-05 MED ORDER — ACETAMINOPHEN 500 MG TABLET
1000.0000 mg | ORAL_TABLET | Freq: Three times a day (TID) | ORAL | Status: DC | PRN
  Administered 2024-05-06 – 2024-05-07 (×3): 1000 mg via ORAL
  Filled 2024-05-05 (×3): qty 2

## 2024-05-05 MED ORDER — ENOXAPARIN 40 MG/0.4 ML SUBCUTANEOUS SYRINGE
40.0000 mg | INJECTION | Freq: Every day | SUBCUTANEOUS | Status: DC
  Administered 2024-05-06 – 2024-05-07 (×2): 40 mg via SUBCUTANEOUS
  Filled 2024-05-05 (×3): qty 0.4

## 2024-05-05 MED ORDER — MAGNESIUM SULFATE 2 GRAM/50 ML (4 %) IN WATER INTRAVENOUS PIGGYBACK
2.0000 g | INJECTION | Freq: Once | INTRAVENOUS | Status: AC
  Administered 2024-05-05: 2 g via INTRAVENOUS
  Administered 2024-05-05: 0 g via INTRAVENOUS
  Filled 2024-05-05: qty 50

## 2024-05-05 MED ORDER — BUDESONIDE 0.5 MG/2 ML SUSPENSION FOR NEBULIZATION
0.5000 mg | INHALATION_SUSPENSION | Freq: Two times a day (BID) | RESPIRATORY_TRACT | Status: DC
  Administered 2024-05-05 – 2024-05-06 (×3): 0.5 mg via RESPIRATORY_TRACT
  Administered 2024-05-07: 0 mg via RESPIRATORY_TRACT

## 2024-05-05 MED ORDER — WATER FOR INJECTION, STERILE INJECTION SOLUTION
1.0000 g | Freq: Three times a day (TID) | INTRAVENOUS | Status: DC
  Administered 2024-05-05 – 2024-05-07 (×6): 1 g via INTRAVENOUS
  Filled 2024-05-05 (×9): qty 20

## 2024-05-05 NOTE — Care Plan (Signed)
 Shift Summary  Braden risk assessment and skin interventions were completed, with no change in risk score and continued use of pressure reduction strategies.   Trace edema was observed in the right ankle and foot, and urinalysis revealed some abnormal findings.   No new skin breakdown or significant changes in skin integrity were documented during the shift.   Overall, skin health and integrity were maintained with ongoing preventive measures.     Goal Outcome Evaluation:     Anxieties, Fears or Concerns: none (05/04/24 1943)  Individualized Care Needs: none (05/04/24 1943)  Patient-Specific Goals (Include Timeframe): to get better and go home (05/04/24 1943)  Plan of Care Reviewed With: (not recorded)     Patient Progress: no change

## 2024-05-05 NOTE — Progress Notes (Addendum)
 [facility]  Hospitalist Progress Note  FULL CODE: ATTEMPT RESUSCITATION/CPR    Edwin [name]  Date of service: 05/05/2024  Date of Admission:  05/04/2024  Hospital Day:  LOS: 0 days     Subjective/Interval Events: 69 y/o male patient with extensive PMH; CAD s/p PCI, CVA, HTN, LVH, HLD, IDDM type II, Diabetic neuropathy, OSA-non compliant with CPAP, Gilbert syndrome. Patient admitted to [facility] with Influenza A, Tachycardia, shortness of breath. CXR and CT imaging of chest did not reveal evidence of PNA. Patient started on Tamiflu  for management of influenza A and risk factors for complication. Lab data significant for mild hyponatremia, hypomagnesemia, and increase in T. Bili today. T. Bili chronic elevation and hx of Gilbert's. Plan to follow LFT's, no other significant LFT abnormality. Consider US  RUQ in future. Troponin mildly elevated, plan to trend value. No reported CP and ECG without obvious new ischemic changes. ECHO ordered and pending.     Vital Signs:  Temp (24hrs) Max:39.8 C (103.6 F)      Systolic (24hrs), Avg:135 , Min:100 , Max:181     Diastolic (24hrs), Avg:75, Min:54, Max:88    Temp  Avg: 38.1 C (100.6 F)  Min: 36.9 C (98.4 F)  Max: 39.8 C (103.6 F)  MAP (Non-Invasive)  Avg: 93.1 mmHG  Min: 69 mmHG  Max: 108 mmHG  Pulse  Avg: 123.3  Min: 96  Max: 137  Resp  Avg: 22.8  Min: 17  Max: 32  SpO2  Avg: 93.4 %  Min: 89 %  Max: 96 %       I/O:  I/O last 24 hours:    Intake/Output Summary (Last 24 hours) at 05/05/2024 1258  Last data filed at 05/05/2024 0811  Gross per 24 hour   Intake 1440 ml   Output 125 ml   Net 1315 ml     I/O current shift:  01/18 0700 - 01/18 1859  In: 120 [P.O.:120]  Out: -       acetaminophen  (TYLENOL ) tablet, 1,000 mg, Oral, 3x/day  aluminum -magnesium  hydroxide-simethicone  (MAG-AL PLUS) 200-200-20 mg per 5 mL oral liquid, 30 mL, Oral, Q4H PRN  aspirin  (ECOTRIN) enteric coated tablet 81 mg, 81 mg, Oral, Daily  atorvastatin  (LIPITOR) tablet, 40 mg, Oral, QPM  benzonatate  (TESSALON )  capsule, 100 mg, Oral, Q8H PRN  budesonide  (PULMICORT  RESPULES) 0.5 mg/2 mL nebulizer suspension, 0.5 mg, Nebulization, 2x/day  cholecalciferol  (VITAMIN D3) 5000 unit (125 mcg) tablet, 5,000 Units, Oral, Daily  clopidogrel  (PLAVIX ) 75 mg tablet, 75 mg, Oral, Daily  dapagliflozin  (FARXIGA ) tablet, 10 mg, Oral, Daily  electrolyte-R pH 7.4 (NORMOSOL-R pH 7.4) premix infusion, , Intravenous, Continuous  [START ON 05/06/2024] enoxaparin  PF (LOVENOX ) 40 mg/0.4 mL SubQ injection, 40 mg, Subcutaneous, Daily  ezetimibe  (ZETIA ) tablet, 10 mg, Oral, QPM  [Held by provider] furosemide  (LASIX ) tablet, 20 mg, Oral, Daily  guaiFENesin  100mg  per 5mL oral liquid - for cough (expectorant), 200 mg, Oral, Q4H PRN  icosapent  ethyl (VASCEPA ) capsule, 2 g, Oral, 2x/day  isosorbide  mononitrate (IMDUR ) 24 hr extended release tablet, 30 mg, Oral, QAM  lactobacillus rhamnosus (CULTURELLE) active cultures capsule, 1 Capsule, Oral, 2x/day-Food  levalbuterol  (XOPENEX ) 0.63 mg/ 3 mL nebulizer solution, 0.63 mg, Nebulization, Q4H PRN  levalbuterol  (XOPENEX ) 0.63 mg/ 3 mL nebulizer solution, 0.63 mg, Nebulization, 3x/day  lidocaine  4% patch, 2 Patch, Transdermal, Daily  [Held by provider] losartan  (COZAAR ) tablet, 25 mg, Oral, Daily  magnesium  sulfate 2 G in SW 50 mL premix IVPB, 2 g, Intravenous, Once  melatonin tablet, 3  mg, Oral, HS PRN  metoprolol  tartrate (LOPRESSOR ) tablet, 100 mg, Oral, 2x/day  NS 250 mL flush bag, , Intravenous, Q15 Min PRN  NS flush syringe, 10 mL, Intracatheter, Q8HRS  NS flush syringe, 10 mL, Intracatheter, Q1H PRN  ondansetron  (ZOFRAN ) 2 mg/mL injection, 4 mg, Intravenous, Q6H PRN  oseltamivir  (TAMIFLU ) capsule, 75 mg, Oral, 2x/day  pantoprazole  (PROTONIX ) delayed release tablet, 40 mg, Oral, NIGHTLY  polyethylene glycol (MIRALAX ) oral packet, 17 g, Oral, Daily PRN  ranolazine  (RANEXA ) extended release tablet, 500 mg, Oral, 2x/day  rOPINIRole  (REQUIP ) tablet, 0.5 mg, Oral, 3x/day  sertraline  (ZOLOFT ) tablet, 50 mg, Oral,  Daily        Physical Exam:    Constitutional:  No distress and alert and oriented.  Eyes:  Conjunctiva clear, Pupils equal and round.   Neck:  Supple, symmetrical, trachea midline.  Respiratory:  Clear to auscultation bilaterally, no wheezes.  Cardiovascular:  Regular rate and rhythm, no murmurs.  Gastrointestinal:  Soft, non-tender, Bowel sounds normal, non-distended.  Musculoskeletal:  Moves all 4 limbs  Integumentary:  Skin warm and dry, No rashes or lesions.  Neurologic:  CN II - XII grossly intact, No tremor.   Extremities: No BLE edema     Labs:    CBC (Last 24 Hours):    Recent Results last 24 hours     05/04/24  1616 05/05/24  0617   WBC 9.8 11.6*   HGB 16.3 15.3   HCT 47.7 45.7   MCV 83.8 84.8   PLTCNT 197 159       Last BMP  (Last result in the past 24 hours)        Na   K   Cl   CO2   BUN   Cr   Calcium   Glucose   Glucose-Fasting        05/05/24 0617 131   4.3   98   24   22   0.90   8.5  Comment: Gadolinium-containing contrast can interfere with calcium measurement.     83               Last Hepatic Panel  (Last result in the past 24 hours)        Albumin   Total PTN   Total Bili   Direct Bili   Ast/SGOT   Alt/SGPT   Alk Phos        05/05/24 0617 3.8   7.0   7.1  Comment: Naproxen therapy can falsely elevate total bilirubin levels.     35   23   91             Recent Labs     05/05/24  0617   MAGNESIUM  1.6*     No results for input(s): INR, APTT in the last 24 hours.  TROPONIN I   Date Value Ref Range Status   12/13/2016 <=0.01 <=0.03 ng/mL Final         Imaging:    Results for orders placed or performed during the hospital encounter of 05/04/24 (from the past 24 hours)   XR AP MOBILE CHEST     Status: None    Narrative    Edwin [name]      PROCEDURE DESCRIPTION: XR PORTABLE CHEST X-RAY    CLINICAL HISTORY: , cough, fever     COMPARISON: 04/15/2024      FINDINGS:  A single view of the chest was reviewed. No focal alveolar infiltrate is identified. Moderate chronic  elevation of the right  diaphragm is present. The cardiac silhouette is within normal limits. No pleural effusion or pneumothorax is seen.          Impression    1. No acute process, showing no change from 04/15/2024.  2. Moderate chronic elevation of the right diaphragm.      Radiologist location ID: [id number]     CT CHEST WO IV CONTRAST     Status: None    Narrative    Edwin [name]    CLINICAL HISTORY: influenza A, Dyspnea    COMPARISON:  Chest radiograph of 05/04/2024 and chest CT of 10/07/2023    FINDINGS:  CT examination of the chest was performed without intravenous contrast enhancement.    Examination shows unremarkable thoracic inlet.  The axillary regions are unremarkable. The hilar and mediastinal structures are normal.  Normal heart size is noted.    Severe diffuse coronary artery calcifications are noted.    Normal thoracic aorta is noted.    No evidence of pulmonary nodule or inflammatory process is seen.    The pleural spaces are normal. Chronic elevation of the right diaphragm is present. Chest wall and bony thorax are unremarkable.  The subdiaphragmatic region is unremarkable.        Impression    1. No acute cardiopulmonary process.  2. Severe coronary artery calcifications and moderate chronic right diaphragmatic elevation.                      Radiologist location ID: [facility]           Microbiology:  No results found for any visits on 05/04/24 (from the past 96 hours).          Assessment/ Plan:     Active Hospital Problems    Diagnosis    Primary Problem: Influenza A    Dyspnea    Sinus tachycardia       Influenza A: Influenza A+, Start scheduled Tylenol  for fever. Plan to start Tamiflu  BID x 5 days due to risk factors and comorbidities.   Dyspnea: Influenza A +, CXR showed no acute process and D-dimer WNL. CT imaging of chest showed no evidence of LRI. Plan management with acute bronchitis.   Tachycardic: Likely related to influenza and fever. ECG re demonstrated sinus tachycardia. D-dimer WNL. Monitor on  telemetry. Check ECHO. Resume home cardiac medications including beta blocker.   Elevated Troponin: H/o CAD however no significant ECG changes and no reported CP or typical angina symptoms. Trend value, monitor on telemetry. Order ECHO. Suspect demand ischemia due to tachycardic and influenza A.   Hypomagnesemia: Mag level 1.6. Plan replacement 2 GM IV. Recheck level in AM.   Hyperbilirubinemia: Elevated T. Bili, hx of Gilbert's. T. Bili increased to 7.1 from 2.7 yesterday. Likely stress response, does appear mildly jaundice. Plan to recheck hepatic function panel. Consider US  RUQ.      Chronic medical conditions:   CAD: S/p PCI, no reported CP or typical angina symptoms. Check cardiac enzymes at admission. Resume home medical management for CAD and associated risk factors.   H/o CVA: Resume DAPT and Statin.   HTN: Monitor scheduled vitals. BP elevated at admission. Resume home antihypertensive medications.   HLD: Continue Statin. Check CPK level   IDDM type II: On Ozempic and administrated q Thursday. Resume SGLT-2 inhibitor. On insulin  pump, monitor FSBS closely.   GERD: On PPI   Mixed anxiety/depression: Continue Zoloft  daily  RLS: On Requip  TID  Vitamin D  deficiency: On Vitamin D  replacement 50,000 units 2 times/week per patient.      DVT/PE Prophylaxis: Enoxaparin   Diet: DIET DIABETIC Carb Amount: CCD4 = 60 Grams; Consistency- Solid: LEVEL 7: Easy to Edwin Morene Raring, DO  Hospitalist, [facility]

## 2024-05-05 NOTE — Care Plan (Signed)
 Shift Summary  C/o Head ache in afternoon Medicated with scheduled Tylenol .  Aerosol treatment and incentive spirometry were provided to address diminished breath sounds and low-normal SpO2.  Oxygen  therapy was maintained at 2 L/min, and positioning assistance was given throughout the shift. Patient did have some SOB early in shift with 02 sat 90-94%. Respirations mor at ease later in shift  NS bolus was administered in the evening following a drop in blood pressure.  Overall, comfort and respiratory interventions were provided in response to changes in pain and respiratory status during the shift.    Goal Outcome Evaluation:     Anxieties, Fears or Concerns: none (05/05/24 0900)  Individualized Care Needs: asist as needed,ADLS (05/05/24 0900)  Patient-Specific Goals (Include Timeframe): to get better and go home (05/05/24 0900)  Plan of Care Reviewed With: patient (05/05/24 0900)     Patient Progress: declining  Pt's BP was low this AM- some medications were held for BP support per MD( see EMAR) Patient's BP dropped to 82/42 this evening. Pt was placed in trendelenburg positions and NS Bolus started per orders. BP improving with these measures and position resumed to semi fowlers postion

## 2024-05-06 ENCOUNTER — Inpatient Hospital Stay (HOSPITAL_COMMUNITY)

## 2024-05-06 LAB — BASIC METABOLIC PANEL
ANION GAP: 10 mmol/L (ref 4–13)
ANION GAP: 9 mmol/L (ref 4–13)
BUN/CREA RATIO: 27 — ABNORMAL HIGH (ref 6–22)
BUN/CREA RATIO: 27 — ABNORMAL HIGH (ref 6–22)
BUN: 27 mg/dL — ABNORMAL HIGH (ref 8–25)
BUN: 27 mg/dL — ABNORMAL HIGH (ref 8–25)
CALCIUM: 7.5 mg/dL — ABNORMAL LOW (ref 8.6–10.3)
CALCIUM: 7.7 mg/dL — ABNORMAL LOW (ref 8.6–10.3)
CHLORIDE: 97 mmol/L (ref 96–111)
CHLORIDE: 99 mmol/L (ref 96–111)
CO2 TOTAL: 18 mmol/L — ABNORMAL LOW (ref 23–31)
CO2 TOTAL: 20 mmol/L — ABNORMAL LOW (ref 23–31)
CREATININE: 1 mg/dL (ref 0.75–1.35)
CREATININE: 1 mg/dL (ref 0.75–1.35)
GLUCOSE: 84 mg/dL (ref 65–125)
GLUCOSE: 126 mg/dL — ABNORMAL HIGH (ref 65–125)
POTASSIUM: 4.1 mmol/L (ref 3.5–5.1)
POTASSIUM: 4.2 mmol/L (ref 3.5–5.1)
SODIUM: 125 mmol/L — ABNORMAL LOW (ref 136–145)
SODIUM: 128 mmol/L — ABNORMAL LOW (ref 136–145)
eGFRcr - MALE: 81 mL/min/1.73mˆ2 (ref >=60)
eGFRcr - MALE: 81 mL/min/1.73mˆ2 (ref >=60)

## 2024-05-06 LAB — CBC WITH DIFF
BASOPHIL #: <0.04 x10ˆ3/uL (ref <=0.20)
BASOPHIL %: 0.1 %
EOSINOPHIL #: <0.04 x10ˆ3/uL (ref <=0.50)
EOSINOPHIL %: 0 %
HCT: 37.8 % — ABNORMAL LOW (ref 38.9–52.0)
HGB: 13.5 g/dL (ref 13.4–17.5)
IMMATURE GRANULOCYTE #: <0.04 x10ˆ3/uL (ref <0.10)
IMMATURE GRANULOCYTE %: 0.3 % (ref 0.0–1.0)
LYMPHOCYTE #: 0.58 x10ˆ3/uL — ABNORMAL LOW (ref 1.00–4.80)
LYMPHOCYTE %: 5.2 %
MCH: 30.1 pg (ref 26.0–32.0)
MCHC: 35.7 g/dL — ABNORMAL HIGH (ref 31.0–35.5)
MCV: 84.2 fL (ref 78.0–100.0)
MONOCYTE #: 0.47 x10ˆ3/uL (ref 0.20–1.10)
MONOCYTE %: 4.2 %
MPV: 9.2 fL (ref 8.7–12.5)
NEUTROPHIL #: 10.12 x10ˆ3/uL — ABNORMAL HIGH (ref 1.50–7.70)
NEUTROPHIL %: 90.2 %
PLATELETS: 144 x10ˆ3/uL — ABNORMAL LOW (ref 150–400)
RBC: 4.49 x10ˆ6/uL — ABNORMAL LOW (ref 4.50–6.10)
RDW-CV: 12.8 % (ref 11.5–15.5)
WBC: 11.2 x10ˆ3/uL — ABNORMAL HIGH (ref 3.7–11.0)

## 2024-05-06 LAB — URINALYSIS, MACRO/MICRO
BILIRUBIN: NEGATIVE mg/dL
BLOOD: NEGATIVE mg/dL
GLUCOSE: >=1000 mg/dL — AB
KETONES: 10 mg/dL — AB
LEUKOCYTE ESTERASE: NEGATIVE WBCs/uL
NITRITE: NEGATIVE
PH: 5.5 (ref 5.0–8.0)
PROTEIN: 30 mg/dL — AB
SPECIFIC GRAVITY: 1.025 (ref 1.005–1.030)
UROBILINOGEN: NEGATIVE mg/dL

## 2024-05-06 LAB — COMPREHENSIVE METABOLIC PANEL, NON-FASTING
ALBUMIN: 3.2 g/dL — ABNORMAL LOW (ref 3.4–4.8)
ALKALINE PHOSPHATASE: 66 U/L (ref 45–115)
ALT (SGPT): 27 U/L (ref <43)
ANION GAP: 10 mmol/L (ref 4–13)
AST (SGOT): 58 U/L — ABNORMAL HIGH (ref 11–34)
BILIRUBIN TOTAL: 5.9 mg/dL — ABNORMAL HIGH (ref 0.3–1.3)
BUN/CREA RATIO: 29 — ABNORMAL HIGH (ref 6–22)
BUN: 29 mg/dL — ABNORMAL HIGH (ref 8–25)
CALCIUM: 8.1 mg/dL — ABNORMAL LOW (ref 8.6–10.3)
CHLORIDE: 102 mmol/L (ref 96–111)
CO2 TOTAL: 21 mmol/L — ABNORMAL LOW (ref 23–31)
CREATININE: 1 mg/dL (ref 0.75–1.35)
GLUCOSE: 129 mg/dL — ABNORMAL HIGH (ref 65–125)
POTASSIUM: 4.7 mmol/L (ref 3.5–5.1)
PROTEIN TOTAL: 6.3 g/dL (ref 6.0–8.0)
SODIUM: 133 mmol/L — ABNORMAL LOW (ref 136–145)
eGFRcr - MALE: 81 mL/min/1.73mˆ2 (ref >=60)

## 2024-05-06 LAB — POC BLOOD GLUCOSE (RESULTS)
GLUCOSE, POC: 91 mg/dL (ref 70–99)
GLUCOSE, POC: 130 mg/dL — ABNORMAL HIGH (ref 70–99)
GLUCOSE, POC: 122 mg/dL — ABNORMAL HIGH (ref 70–99)
GLUCOSE, POC: 122 mg/dL — ABNORMAL HIGH (ref 70–99)

## 2024-05-06 LAB — HEPATIC FUNCTION PANEL
ALBUMIN: 3.2 g/dL — ABNORMAL LOW (ref 3.4–4.8)
ALKALINE PHOSPHATASE: 70 U/L (ref 45–115)
ALT (SGPT): 26 U/L (ref <43)
AST (SGOT): 57 U/L — ABNORMAL HIGH (ref 11–34)
BILIRUBIN DIRECT: 1.1 mg/dL — ABNORMAL HIGH (ref 0.1–0.4)
BILIRUBIN TOTAL: 7.3 mg/dL — ABNORMAL HIGH (ref 0.3–1.3)
PROTEIN TOTAL: 6.3 g/dL (ref 6.0–8.0)

## 2024-05-06 LAB — URINALYSIS, MICROSCOPIC

## 2024-05-06 LAB — OSMOLALITY: OSMOLALITY BLOOD: 280 mosm/kg (ref 280–300)

## 2024-05-06 LAB — THYROID STIMULATING HORMONE (SENSITIVE TSH): TSH: 0.384 u[IU]/mL (ref 0.350–4.940)

## 2024-05-06 LAB — TROPONIN, LAB ISTAT: TROPONIN HS, POC: 40 ng/L (ref <=28.00)

## 2024-05-06 LAB — PROCALCITONIN: PROCALCITONIN: 0.21 ng/mL (ref <0.50)

## 2024-05-06 LAB — SODIUM, RANDOM URINE: SODIUM RANDOM URINE: 31 mmol/L

## 2024-05-06 LAB — B-TYPE NATRIURETIC PEPTIDE (BNP),PLASMA: NT-PROBNP: 1348.6 pg/mL — ABNORMAL HIGH (ref <300.0)

## 2024-05-06 LAB — GAMMA GT: GGT: 18 U/L (ref <55)

## 2024-05-06 MED ORDER — SODIUM CHLORIDE 0.9 % INTRAVENOUS SOLUTION
INTRAVENOUS | Status: DC
  Administered 2024-05-07: 0 mL via INTRAVENOUS

## 2024-05-06 MED ORDER — PREDNISONE 20 MG TABLET
20.0000 mg | ORAL_TABLET | Freq: Two times a day (BID) | ORAL | Status: DC
  Administered 2024-05-06 – 2024-05-07 (×3): 20 mg via ORAL
  Filled 2024-05-06 (×5): qty 1

## 2024-05-06 MED ORDER — PREDNISONE 20 MG TABLET: 20.0000 mg | ORAL_TABLET | Freq: Two times a day (BID) | ORAL | Status: DC

## 2024-05-06 NOTE — Care Management Notes (Signed)
 [facility]  Care Management Initial Evaluation    Patient Name: Edwin [name]  Date of Birth: 1956/01/19  Sex: male  Date/Time of Admission: 05/04/2024  3:50 PM  Room/Bed: 50/A  Payor: [place] MEDICARE / Plan: [place] MEDICARE ADVANTAGE PPO / Product Type: PPO /   Primary Care Providers:  Froylan Jester, MD, MD (General)    Pharmacy Info:   Preferred Pharmacy       [facility] - [place], [place] - [address]    [address] [place] [address]    Phone: [phone] Fax: [phone]    Hours: Not open 24 hours          Emergency Contact Info:   Extended Emergency Contact Information  Primary Emergency Contact: NUCHEM, [place], [place] [address] [place]  Mobile Phone: [phone]  Relation: Brother  Preferred language: English    History:   Hilman Kissling is a 69 y.o., male, admitted 05/04/2024.    Height/Weight: 172.7 cm (5' 8) / 96.6 kg (213 lb)     LOS: 1 day   Admitting Diagnosis: Influenza A [J10.1]    Assessment:      05/06/24 1500   Assessment Detail   Assessment Type Admission   Date of Care Management Update 05/06/24   Readmission   Is this a readmission? No   Medicare Intent to Discharge Documentation   Admit IMM given to: Patient   Admit IMM letter given date 05/06/24   Admit IMM letter time given 1055   Social Work Plan   Discharge Planning Status initial meeting   Anticipated Discharge Disposition Home   CM will evaluate for rehabilitation potential yes   Plan Pt will DC to home when medically ready with continued family support.   Patient/Family in Agreement with Plan yes   Plan Comments Pt will DC to home when medically ready.   CM to Do MSW will continue to monitor pt for DC needs.   PAST SERVICES   Have you had any in-home services in the past? No   Have you had Home Health Services in the past? No   Have you been to a Skilled Nursing Facility in the past? No   Discharge Needs Assessment   Concerns To Be Addressed no  discharge needs identified;denies needs/concerns at this time   Readmission Within the Last 30 Days no previous admission in last 30 days   Equipment Currently Used at Home cane, quad   Equipment Needed After Discharge none   Currently on Outpatient Dialysis? No   Discharge Facility/Level of Care Needs Home (Patient/Family Member/other)(code 1)   Transportation Available car;family or friend will provide   Referral Information   Admission Type observation   Arrived From home or self-care   Address Verified verified-no changes   Insurance Verified verified-no change   Source of Information Patient   Referral Source physician   Reason for Consult discharge planning   Observation Form   Observation Form Given MOON form given   MOON form explained/reviewed with:  Patient;verbalized understanding   MOON form given to [employer]   MOON form date of delivery 05/06/24   MOON form time of delivery 1050   ADVANCE DIRECTIVES   Does the Patient have an Advance Directive? Yes, Patient Does Have Advance Directive for Healthcare Treatment   Type of Advance Directive Completed Medical Power of Attorney   Copy of Advance Directives  in Chart? No, Copy Requested From Patient   Name of MPOA or Healthcare Surrogate Ellijah Leffel   Phone Number of MPOA or Healthcare Surrogate [phone]   Patient Requests Assistance in Having Advance Directive Notarized. N/A   Employment/Military   Source of Income social security   Patient has Prescription Coverage?  Yes        Pharmacy Name [facility] Location [place], [place]        Pharmacy Phone Number [phone]   Function Health MU group   Deaf or serious difficulty hearing? N   Blind or serious difficulty seeing even with glasses? N   Serious difficulty walking/climbing stairs? N   Difficulty dressing or bathing? N   Has difficulty doing errands because of physical, mental or emotional condition? N   Has serious difficulty concentrating, remembering or making  decisions because of physical, mental or emotional condition ? N   Adaptive Devices Used: Cane;Glasses   Spiritual Beliefs   Spiritual, Cultural Beliefs, Religious Practices, Values that Affect Care no   Spiritual Care Notified no   Hospital Chaplain Requested no   Admission Notifications   Do you want your PCP notified of your admission? No   Do you want your family/representative notified of your admission? Family aware of admission   Living Environment   Lives With alone   Living Arrangements apartment   Current Living Arrangements apartment   Primary Care Provided by self   Able to Return to Prior Arrangements yes   Functional Status Prior   Ambulation 0 - independent   Transferring 0 - independent   Toileting 0 - independent   Bathing 0 - independent   Dressing 0 - independent   Eating 0 - independent   Communication 0 - understands/communicates without difficulty   Swallowing 0 - swallows foods/liquids without difficulty   Prior Functional Level Comment Pt drives. Pt stated rarely he will use a cane to ambulate.   Functional Status, IADL   Medications independent   Meal Preparation independent   Housekeeping independent   Laundry independent   Shopping independent   MEDICATION MANAGEMENT   Do you manage your own medications Yes   Do you use a pill box? Yes   Usage of Pill Box Pt fills up his pill box.   Emotional/Psychological   Affect affect consistent with mood   Emotion/Mood calm   Speech clear, coherent   Coping/Psychosocial Assessment   Observed Emotional State calm;cooperative;pleasant   Verbalized Emotional State acceptance       Discharge Plan:  Home (Patient/Family Member/other) (code 1)  Pt will DC to home when medically ready with continued family support. He denies any DC needs. Pt was admitted OBS but transitioned to MIP. MSW explained MOON form with pt; he verbalized understanding, did not have any questions and signed the form.     The patient will continue to be evaluated for developing  discharge needs.     Case Manager: Daphne Ruth, SOCIAL WORKER  Phone: [phone] ext. [phone]

## 2024-05-06 NOTE — Progress Notes (Signed)
 [facility]  Hospitalist Progress Note  FULL CODE: ATTEMPT RESUSCITATION/CPR    Edwin [name]  Date of service: 05/06/2024  Date of Admission:  05/04/2024  Hospital Day:  LOS: 1 day     Subjective/Interval Events: 69 y/o male patient with extensive PMH; CAD s/p PCI, CVA, HTN, LVH, HLD, IDDM type II, Diabetic neuropathy, OSA-non compliant with CPAP, Gilbert syndrome. Patient admitted to [facility] with Influenza A, Tachycardia, shortness of breath. CXR and CT imaging of chest did not reveal evidence of PNA. Patient started on Tamiflu  for management of influenza A and risk factors for complication. T. Bili chronic elevation and hx of Gilbert's however T. Bili remains elevated at 7.3.. Plan to follow LFT's, no other significant LFT abnormality except mild elevation in AST. Plan to order US  of RUQ.  Troponin mildly elevated, plan to trend value with decrease down to 40. Preserved EF on ECHo without significant valvular insufficiency on initial report. No reported CP and ECG without obvious new ischemic changes. Hospital course complicated by hypotension yesterday that responded to IV fluids and hyponatremia. Na level decreased to 125 on chem panel today. Patient has reported increased water  intake without limited other oral intake yesterday. Plan to fluid restrict, appears euvolemic on exam. Check serum osmolality and pending. IV fluids changed to NS and Na level increased to 128. Continue to follow closely. Patient feels stable, no significant increase in cough or SOB. Therapy to evaluate.     Vital Signs:  Temp (24hrs) Max:37.8 C (100 F)      Systolic (24hrs), Avg:108 , Min:82 , Max:148     Diastolic (24hrs), Avg:62, Min:42, Max:77    Temp  Avg: 37.1 C (98.8 F)  Min: 36.4 C (97.5 F)  Max: 37.8 C (100 F)  MAP (Non-Invasive)  Avg: 80.8 mmHG  Min: 71 mmHG  Max: 96 mmHG  Pulse  Avg: 90.4  Min: 76  Max: 109  Resp  Avg: 18  Min: 18  Max: 18  SpO2  Avg: 94.5 %  Min: 89 %  Max: 97 %       I/O:  I/O last 24 hours:     Intake/Output Summary (Last 24 hours) at 05/06/2024 1304  Last data filed at 05/06/2024 1301  Gross per 24 hour   Intake 3662.92 ml   Output 1200 ml   Net 2462.92 ml     I/O current shift:  01/19 0700 - 01/19 1859  In: 480 [P.O.:480]  Out: 700 [Urine:700]      acetaminophen  (TYLENOL ) tablet, 1,000 mg, Oral, 3x/day PRN  aluminum -magnesium  hydroxide-simethicone  (MAG-AL PLUS) 200-200-20 mg per 5 mL oral liquid, 30 mL, Oral, Q4H PRN  aspirin  (ECOTRIN) enteric coated tablet 81 mg, 81 mg, Oral, Daily  atorvastatin  (LIPITOR) tablet, 40 mg, Oral, QPM  benzonatate  (TESSALON ) capsule, 100 mg, Oral, Q8H PRN  budesonide  (PULMICORT  RESPULES) 0.5 mg/2 mL nebulizer suspension, 0.5 mg, Nebulization, 2x/day  cholecalciferol  (VITAMIN D3) 5000 unit (125 mcg) tablet, 5,000 Units, Oral, Daily  clopidogrel  (PLAVIX ) 75 mg tablet, 75 mg, Oral, Daily  dapagliflozin  (FARXIGA ) tablet, 10 mg, Oral, Daily  doxycycline  tablet, 100 mg, Oral, 2x/day  enoxaparin  PF (LOVENOX ) 40 mg/0.4 mL SubQ injection, 40 mg, Subcutaneous, Daily  ezetimibe  (ZETIA ) tablet, 10 mg, Oral, QPM  [Held by provider] furosemide  (LASIX ) tablet, 20 mg, Oral, Daily  guaiFENesin  100mg  per 5mL oral liquid - for cough (expectorant), 200 mg, Oral, Q4H PRN  icosapent  ethyl (VASCEPA ) capsule, 2 g, Oral, 2x/day  isosorbide  mononitrate (IMDUR ) 24 hr extended release  tablet, 30 mg, Oral, QAM  lactobacillus rhamnosus (CULTURELLE) active cultures capsule, 1 Capsule, Oral, 2x/day-Food  levalbuterol  (XOPENEX ) 0.63 mg/ 3 mL nebulizer solution, 0.63 mg, Nebulization, Q4H PRN  levalbuterol  (XOPENEX ) 0.63 mg/ 3 mL nebulizer solution, 0.63 mg, Nebulization, 3x/day  lidocaine  4% patch, 2 Patch, Transdermal, Daily  [Held by provider] losartan  (COZAAR ) tablet, 25 mg, Oral, Daily  melatonin tablet, 3 mg, Oral, HS PRN  meropenem  (MERREM ) 1 g in SW 20 mL IV injection, 1 g, Intravenous, Q8H  metoprolol  tartrate (LOPRESSOR ) tablet, 100 mg, Oral, 2x/day  NS 250 mL flush bag, , Intravenous, Q15 Min  PRN  NS flush syringe, 10 mL, Intracatheter, Q8HRS  NS flush syringe, 10 mL, Intracatheter, Q1H PRN  NS premix infusion, , Intravenous, Continuous  ondansetron  (ZOFRAN ) 2 mg/mL injection, 4 mg, Intravenous, Q6H PRN  oseltamivir  (TAMIFLU ) capsule, 75 mg, Oral, 2x/day  pantoprazole  (PROTONIX ) delayed release tablet, 40 mg, Oral, NIGHTLY  polyethylene glycol (MIRALAX ) oral packet, 17 g, Oral, Daily PRN  predniSONE  (DELTASONE ) tablet, 20 mg, Oral, 2x/day-Food  ranolazine  (RANEXA ) extended release tablet, 500 mg, Oral, 2x/day  rOPINIRole  (REQUIP ) tablet, 0.5 mg, Oral, 3x/day  sertraline  (ZOLOFT ) tablet, 50 mg, Oral, Daily        Physical Exam:    Constitutional: Alert and oriented, no focal deficits   HEENT: Nasal cannula noted   Respiratory:  Improved breath sounds, no audible wheezing today. Breathing unlabored.   Cardiovascular:  Regular rate and rhythm, no murmurs.  Gastrointestinal:  Soft, non-tender, Bowel sounds normal, non-distended.  Musculoskeletal:  Moves all 4 limbs  Integumentary:  Skin warm and dry, No rashes or lesions.  Neurologic: Appears fatigued however no focal deficits, alert and oriented, responding to questions appropriately, moving extremities.   Extremities: No BLE edema     Labs:    CBC (Last 24 Hours):    Recent Results last 24 hours     05/05/24  2216 05/06/24  0444   WBC 11.3* 11.2*   HGB 14.5 13.5   HCT 41.5 37.8*   MCV 85.2 84.2   PLTCNT 140* 144*       Last BMP  (Last result in the past 24 hours)        Na   K   Cl   CO2   BUN   Cr   Calcium   Glucose   Glucose-Fasting        05/06/24 1004 128   4.2   99   20   27   1.00   7.7  Comment: Gadolinium-containing contrast can interfere with calcium measurement.     126               Last Hepatic Panel  (Last result in the past 24 hours)        Albumin   Total PTN   Total Bili   Direct Bili   Ast/SGOT   Alt/SGPT   Alk Phos        05/06/24 0444 3.2   6.3   7.3  Comment: Naproxen therapy can falsely elevate total bilirubin levels.   1.1   57    26   70             No results for input(s): MAGNESIUM , PHOSPHORUS in the last 24 hours.    No results for input(s): INR, APTT in the last 24 hours.  TROPONIN I   Date Value Ref Range Status   12/13/2016 <=0.01 <=0.03 ng/mL Final  Imaging:    Results for orders placed or performed during the hospital encounter of 05/04/24 (from the past 24 hours)   XR AP MOBILE CHEST     Status: None    Narrative    Edwin [name]      PROCEDURE DESCRIPTION: XR PORTABLE CHEST X-RAY    CLINICAL HISTORY: , influenza A     COMPARISON: 05/04/2024      FINDINGS:  A single view of the chest was reviewed. No focal alveolar infiltrate is identified. The cardiac silhouette is within normal limits. No pleural effusion or pneumothorax is seen.    Moderate chronic elevation of the right diaphragm is present.      Impression    No acute process, showing no change from 1/17.      Radiologist location ID: [facility]           Microbiology:  Hospital Encounter on 05/04/24 (from the past 96 hours)   ADULT ROUTINE BLOOD CULTURE, SET OF 2 ADULT BOTTLES (BACTERIA AND YEAST)    Collection Time: 05/04/24  4:16 PM    Specimen: Blood   Culture Result Status    BLOOD CULTURE, ROUTINE No Growth 18-24 hrs. Preliminary             Assessment/ Plan:     Active Hospital Problems    Diagnosis    Primary Problem: Influenza A    Dyspnea    Sinus tachycardia       Influenza A: Influenza A+, Complete course of Tamiflu .   Dyspnea: Influenza A +, CXR showed no acute process and D-dimer WNL. CT imaging of chest showed no evidence of LRI. Plan management with acute bronchitis.   Tachycardic: Likely related to influenza and fever. ECG re demonstrated sinus tachycardia. D-dimer WNL. Monitor on telemetry. ECHO obtained and results pending. Resume home cardiac medications including beta blocker.   Elevated Troponin: H/o CAD however no significant ECG changes and no reported CP or typical angina symptoms. Trend value with decrease down to 40, monitor on  telemetry. Order ECHO and results pending, initial report showed preserved EF without focal wall motion abnormalities. Suspect demand ischemia due to tachycardic and influenza A.   Hypomagnesemia: Mag level 1.6. Plan replacement 2 GM IV. Recheck level.   Hyperbilirubinemia: Elevated T. Bili, hx of Gilbert's. T. Bili increased to 7.3 from 2.7 yesterday. Likely stress response, does appear mildly jaundice. Plan to recheck CMP. No significant urobilinogen on u/a. GGT WNL and no significant obstructive pattern on LFT's. Plan US  RUQ.     Chronic medical conditions:   CAD: S/p PCI, no reported CP or typical angina symptoms. Check cardiac enzymes at admission. Resume home medical management for CAD and associated risk factors.   H/o CVA: Resume DAPT and Statin.   HTN: Monitor scheduled vitals. BP elevated at admission. Resume home antihypertensive medications.   HLD: Continue Statin.   IDDM type II: On Ozempic and administrated q Thursday. Resume SGLT-2 inhibitor. On insulin  pump, monitor FSBS closely.   GERD: On PPI   Mixed anxiety/depression: Continue Zoloft  daily  RLS: On Requip  TID   Vitamin D  deficiency: On Vitamin D  replacement 50,000 units 2 times/week per patient.      DVT/PE Prophylaxis: Enoxaparin   Diet: DIET DIABETIC Carb Amount: CCD4 = 60 Grams; Consistency- Solid: LEVEL 7: Easy to Chew; Restrict fluids to: 1200 ML    Morene Raring, DO  Hospitalist, [facility]

## 2024-05-06 NOTE — Care Plan (Signed)
 Shift Summary  Oxygen  therapy was discontinued in the afternoon.   PRN medications for cough were administered twice, and predniSONE  was given twice during the shift.   Enoxaparin  was administered in the morning for VTE prevention.   Blood glucose levels were elevated at two points during the shift.   Overall, safety and skin integrity interventions were maintained, and respiratory support was adjusted as needed.     Absence of Hospital-Acquired Illness or Injury: Fall precautions and droplet isolation were maintained throughout most of the shift, and safety rounds were completed; no new hospital-acquired issues were documented.     Skin Health and Integrity: Braden score remained in the low-risk range, moisture management and pressure reduction techniques were consistently applied, and the bed and linens stayed dry and wrinkle-free.     Optimal Gas Exchange: Oxygen  therapy was weaned from 2L to room air during the shift, but SpO2 decreased from 97% to 91%; respiratory rate remained stable.     Goal Outcome Evaluation: BP remained above 90/60 during shift after Metoprolol  this AM given per physician order.     Anxieties, Fears or Concerns: Blood pressure dropping again (05/06/24 1000)  Individualized Care Needs: BP monitoring, emptying urinal (05/06/24 1000)  Patient-Specific Goals (Include Timeframe): Return home (05/06/24 1000)  Plan of Care Reviewed With: patient (05/06/24 1000)     Patient Progress: no change
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
## 2024-05-06 NOTE — Care Plan (Signed)
 [facility]  Rehabilitation Services  Occupational Therapy Initial Evaluation    Patient Name: Edwin [name]  Date of Birth: 09-Apr-1956  Height: Height: 172.7 cm (5' 8)  Weight: Weight: 96.6 kg (213 lb)  Room/Bed: 50/A  Payor: [place] MEDICARE / Plan: [place] MEDICARE ADVANTAGE PPO / Product Type: PPO /     Assessment:   Edwin [name] is a 69 y/o male seen for OT eval this date. Pt completed functional transfers with CGA and ambulation without AD and CGA. Pt demonstrates CGA for LB ADLs. Pt demonstrates mildly decreased balance, decreased strength, and impaired activity tolerance limiting functional ability at this time. Skilled OT is required to improve the above deficits to increase pt's safety and independence with ADL tasks and functional transfers. Recommend d/c home when medically stable.      Discharge Needs:   Equipment Recommendation: to be determined      Discharge Disposition: home with home health    JUSTIFICATION OF DISCHARGE RECOMMENDATION   Based on current diagnosis, functional performance prior to admission, and current functional performance, this patient requires continued OT services in home with home health  in order to achieve significant functional improvements.    Plan:   Current Intervention: ADL retraining, IADL retraining, balance training, endurance training, strengthening, therapeutic exercise, transfer training    To provide Occupational therapy services 1x/day, minimum of 2x/week, until discharge.       The risks/benefits of therapy have been discussed with the patient/caregiver and he/she is in agreement with the established plan of care.       Subjective & Objective        05/06/24 1151   Therapist Pager   OT Assigned/ Phone # Norman Sharps   Rehab Session   Document Type evaluation   Total OT Minutes: 12   Patient Effort good   General Information   Patient Profile Reviewed yes   Onset of Illness/Injury or Date of Surgery 05/04/24   Patient/Family/Caregiver  Comments/Observations Pt reports he is feeling slightly weaker than normal.   Pertinent History of Current Functional Problem 69 y/o male admitted with Influenza A.   Respiratory Status nasal cannula   Existing Precautions/Restrictions full code;fall precautions;droplet isolation   Pre Treatment Status   Pre Treatment Patient Status Patient supine in bed;Call light within reach;Telephone within reach;Nurse approved session   Support Present Pre Treatment  None   Communication Pre Treatment  Nurse   Mutuality/Individual Preferences   Individualized Care Needs OOB with Ax1   Plan of Care Reviewed With patient   Living Environment   Lives With alone   Living Arrangements apartment   Home Accessibility bed and bath on same level   Functional Level Prior   Ambulation 0 - independent   Transferring 0 - independent   Toileting 0 - independent   Bathing 0 - independent   Dressing 0 - independent   Eating 0 - independent   Self-Care   Equipment Currently Used at Home yes   Equipment Currently Used at Home none   Vital Signs   Vitals Comment VSS   Pain   Additional Documentation Pain Scale: Numbers Pre/Post-Treatment (Group)   Pain Assessment   Pre/Posttreatment Pain Comment neuropathy pain in bilateral feet   Coping/Psychosocial   Observed Emotional State calm;cooperative   Verbalized Emotional State acceptance   Cognition   Behavior/Mood Observations behavior appropriate to situation, WNL/WFL   Orientation Status oriented x 4   Attention WNL/WFL   Follows Commands WNL  RUE Assessment   RUE Assessment WFL for stated baseline   LUE Assessment   LUE Assessment WFL for stated baseline   Mobility Assessment/Training   Mobility Comment Pt completed functional ambulation within room using no AD with CGA demonstrating mild unsteadiness.   Bed Mobility   Supine-Sit Independence independent   Transfer Assessment/Treatment   Sit-Stand Independence contact guard assist   Stand-Sit Independence contact guard assist   Sit-Stand-Sit,  Assist Device None   Bed-Chair Independence contact guard assist   Chair-Bed Independence contact guard assist   Bed-Chair-Bed Assist Device none   Toilet Transfer Independence contact guard assist   Toilet Transfer Assist Device none   Transfer Impairments balance impaired;endurance;strength decreased   Bathing Assessment/Intervention   Position sitting;standing   Independence Level contact guard assist   Upper Body Dressing Assessment/Training   Position  sitting   Independence Level  supervision required   Lower Body Dressing Assessment/Training   Position sitting;standing   Independence Level  contact guard assist   Impairments activity tolerance impaired;balance impaired;strength decreased   Toileting Assessment/Training   Position sitting;standing   Independence Level  contact guard assist   Impairments activity tolerance impaired;balance impaired;strength decreased   Grooming/Oral Hygeine  Assessment/Training   Position standing   Independence Level contact guard assist   Impairments activity tolerance impaired;balance impaired;strength decreased   Self-Feeding Assessment/Training   Position  sitting   Independence Level independent   Balance   Sitting Balance: Static normal balance   Sitting Balance: Dynamic normal balance   Sit-to-Stand Balance fair balance   Standing Balance: Static fair balance   Standing Balance: Dynamic fair - balance   Post Treatment Status   Post Treatment Patient Status Patient sitting in bedside chair or w/c;Call light within reach;Telephone within reach   Support Present Post Treatment  None   Communication Post Treatement Nurse   Communication Post Treatment Comment no alarm needed   Care Plan Goals   OT Rehab Goals Occupational Therapy Goal;LB Dressing Goal;Transfer Training Goal   Occupational Therapy Goals   OT Goal, Date Established 05/06/24   OT Goal, Time to Achieve by discharge   OT Goal, Activity Type Pt will demonstrate Fair dynamic standing balance to increase  independence with standing ADLs.   LB Dressing Goal   LB Dressing Goal, Date Established 05/06/24   LB Dressing Goal, Time to Achieve by discharge   LB Dressing Goal, Activity Type all lower body dressing tasks   LB Dressing Goal, Current Status contact guard assist   LB Dressing Goal, Independence Level independent   Transfer Training Goal   Transfer Training Goal, Date Established 05/06/24   Transfer Training Goal, Time to Achieve by discharge   Transfer Training Goal, Activity Type all transfers   Transfer Training Goal, Current Status contact guard assist   Transfer Training Goal, Independence Level modified independence   Transfer Training Goal, Assist Device least restrictive assistive device   Planned Therapy Interventions, OT Eval   Planned Therapy Interventions ADL retraining;IADL retraining;balance training;endurance training;strengthening;therapeutic exercise;transfer training   Functional Impairment   Overall Functional Impairments/Problem List balance impaired;endurance;strength decreased   Clinical Impression   OT Diagnosis Reduced mobility; Need for assistance with personal care   Functional Level at Time of Session Edwin [name] is a 69 y/o male seen for OT eval this date. Pt completed functional transfers with CGA and ambulation without AD and CGA. Pt demonstrates CGA for LB ADLs. Pt demonstrates mildly decreased balance, decreased strength, and impaired activity tolerance limiting functional ability  at this time. Skilled OT is required to improve the above deficits to increase pt's safety and independence with ADL tasks and functional transfers. Recommend d/c home when medically stable.   Criteria for Skilled Therapeutic Interventions Met (OT) yes;meets criteria;skilled treatment is necessary   Rehab Potential good   Therapy Frequency 1x/day;minimum of 2x/week   Predicted Duration of Therapy until discharge   Anticipated Equipment Needs at Discharge to be determined   Anticipated Discharge  Disposition home with home health   Evaluation Complexity Justification   Occupational Profile Review Brief history   Performance Deficits 1-3 deficits;Strength;Balance;Endurance   Clinical Decision Making Low analytic complexity   Evaluation Complexity Low       Therapist:   Norman Sharps, OT  05/06/2024 13:14

## 2024-05-06 NOTE — Nurses Notes (Signed)
 Pt with elevated temp of 100 and brown tenacious sputum with cough. Given Tylenol  for temp and Tessalon  per request at this time. Pt was also anxious after HS, stating I think I'm having a panic attack. Reassurance, education, and repositioning to chair all helpful.

## 2024-05-06 NOTE — Care Plan (Signed)
 Shift Summary  SpO2 dropped to 89% late in the evening, with subsequent improvement to 93% after interventions.   Benzonatate  and acetaminophen  were administered during the night for cough and elevated temperature.   Electrolyte-R pH 7.4 infusion rate was increased in the early morning.   MEWS score increased from 2 to 3 and remained elevated throughout the shift.   Patient remained calm and cooperative, with pain well controlled.     Optimal Comfort and Wellbeing: Pain remained at 0 throughout the shift and acetaminophen  was administered once; patient was able to be repositioned with assistance and verbalized acceptance and calmness. Electrolyte-R pH 7.4 was administered and rate increased during the shift.     Optimal Gas Exchange: SpO2 decreased to 89% before improving to 93%, and benzonatate  was given for cough; breath sounds were coarse and diminished bilaterally, and MEWS score increased from 2 to 3 and remained elevated. CBC showed persistently high WBC and neutrophils, and CRP was elevated, while temperature trended upward slightly.     Goal Outcome Evaluation:     Anxieties, Fears or Concerns: none voiced (05/05/24 1916)  Individualized Care Needs: up w/ FWW x 2 asst (05/05/24 1916)  Patient-Specific Goals (Include Timeframe): get better and go home (05/05/24 1916)  Plan of Care Reviewed With: patient (05/05/24 1916)     Patient Progress: no change

## 2024-05-07 ENCOUNTER — Inpatient Hospital Stay (HOSPITAL_COMMUNITY)

## 2024-05-07 ENCOUNTER — Inpatient Hospital Stay (INDEPENDENT_AMBULATORY_CARE_PROVIDER_SITE_OTHER): Payer: Self-pay | Admitting: Student in an Organized Health Care Education/Training Program

## 2024-05-07 ENCOUNTER — Inpatient Hospital Stay
Admission: EM | Admit: 2024-05-07 | Discharge: 2024-05-09 | DRG: 069 | Disposition: A | Discharge status: Home / Self Care | Source: Other Acute Inpatient Hospital | Attending: Student in an Organized Health Care Education/Training Program | Admitting: Student in an Organized Health Care Education/Training Program

## 2024-05-07 ENCOUNTER — Encounter (HOSPITAL_COMMUNITY): Payer: Self-pay

## 2024-05-07 ENCOUNTER — Other Ambulatory Visit: Payer: Self-pay

## 2024-05-07 ENCOUNTER — Telehealth (HOSPITAL_COMMUNITY): Payer: Self-pay | Admitting: Neurology

## 2024-05-07 DIAGNOSIS — G459 Transient cerebral ischemic attack, unspecified: Principal | ICD-10-CM | POA: Diagnosis present

## 2024-05-07 DIAGNOSIS — Z7985 Long-term (current) use of injectable non-insulin antidiabetic drugs: Secondary | ICD-10-CM

## 2024-05-07 DIAGNOSIS — J449 Chronic obstructive pulmonary disease, unspecified: Secondary | ICD-10-CM | POA: Diagnosis present

## 2024-05-07 DIAGNOSIS — Z7951 Long term (current) use of inhaled steroids: Secondary | ICD-10-CM

## 2024-05-07 DIAGNOSIS — J101 Influenza due to other identified influenza virus with other respiratory manifestations: Secondary | ICD-10-CM | POA: Diagnosis present

## 2024-05-07 DIAGNOSIS — I110 Hypertensive heart disease with heart failure: Secondary | ICD-10-CM | POA: Diagnosis present

## 2024-05-07 DIAGNOSIS — R1310 Dysphagia, unspecified: Secondary | ICD-10-CM | POA: Diagnosis present

## 2024-05-07 DIAGNOSIS — R200 Anesthesia of skin: Secondary | ICD-10-CM

## 2024-05-07 DIAGNOSIS — E1142 Type 2 diabetes mellitus with diabetic polyneuropathy: Secondary | ICD-10-CM | POA: Diagnosis present

## 2024-05-07 DIAGNOSIS — R29700 NIHSS score 0: Secondary | ICD-10-CM

## 2024-05-07 DIAGNOSIS — Z9282 Status post administration of tPA (rtPA) in a different facility within the last 24 hours prior to admission to current facility: Secondary | ICD-10-CM

## 2024-05-07 DIAGNOSIS — Z7984 Long term (current) use of oral hypoglycemic drugs: Secondary | ICD-10-CM

## 2024-05-07 DIAGNOSIS — F419 Anxiety disorder, unspecified: Secondary | ICD-10-CM | POA: Diagnosis present

## 2024-05-07 DIAGNOSIS — Z823 Family history of stroke: Secondary | ICD-10-CM

## 2024-05-07 DIAGNOSIS — E66812 Obesity, class 2: Secondary | ICD-10-CM | POA: Diagnosis present

## 2024-05-07 DIAGNOSIS — I639 Cerebral infarction, unspecified: Principal | ICD-10-CM | POA: Diagnosis present

## 2024-05-07 DIAGNOSIS — F32A Depression, unspecified: Secondary | ICD-10-CM | POA: Diagnosis present

## 2024-05-07 DIAGNOSIS — Z955 Presence of coronary angioplasty implant and graft: Secondary | ICD-10-CM

## 2024-05-07 DIAGNOSIS — E119 Type 2 diabetes mellitus without complications: Secondary | ICD-10-CM

## 2024-05-07 DIAGNOSIS — I672 Cerebral atherosclerosis: Secondary | ICD-10-CM | POA: Diagnosis present

## 2024-05-07 DIAGNOSIS — Z7982 Long term (current) use of aspirin: Secondary | ICD-10-CM

## 2024-05-07 DIAGNOSIS — Z79899 Other long term (current) drug therapy: Secondary | ICD-10-CM

## 2024-05-07 DIAGNOSIS — Z7902 Long term (current) use of antithrombotics/antiplatelets: Secondary | ICD-10-CM

## 2024-05-07 DIAGNOSIS — I2510 Atherosclerotic heart disease of native coronary artery without angina pectoris: Secondary | ICD-10-CM | POA: Diagnosis present

## 2024-05-07 DIAGNOSIS — Z794 Long term (current) use of insulin: Secondary | ICD-10-CM

## 2024-05-07 DIAGNOSIS — R2990 Unspecified symptoms and signs involving the nervous system: Secondary | ICD-10-CM

## 2024-05-07 DIAGNOSIS — K219 Gastro-esophageal reflux disease without esophagitis: Secondary | ICD-10-CM | POA: Diagnosis present

## 2024-05-07 DIAGNOSIS — Z6830 Body mass index (BMI) 30.0-30.9, adult: Secondary | ICD-10-CM

## 2024-05-07 DIAGNOSIS — Z9641 Presence of insulin pump (external) (internal): Secondary | ICD-10-CM | POA: Diagnosis present

## 2024-05-07 DIAGNOSIS — E785 Hyperlipidemia, unspecified: Secondary | ICD-10-CM | POA: Diagnosis present

## 2024-05-07 DIAGNOSIS — I5032 Chronic diastolic (congestive) heart failure: Secondary | ICD-10-CM | POA: Diagnosis present

## 2024-05-07 LAB — CBC WITH DIFF
BASOPHIL #: <0.04 x10ˆ3/uL (ref <=0.20)
BASOPHIL %: 0.2 %
EOSINOPHIL #: <0.04 x10ˆ3/uL (ref <=0.50)
EOSINOPHIL %: 0 %
HCT: 39.7 % (ref 38.9–52.0)
HGB: 13.3 g/dL — ABNORMAL LOW (ref 13.4–17.5)
IMMATURE GRANULOCYTE #: <0.04 x10ˆ3/uL (ref <0.10)
IMMATURE GRANULOCYTE %: 0.2 % (ref 0.0–1.0)
LYMPHOCYTE #: 0.76 x10ˆ3/uL — ABNORMAL LOW (ref 1.00–4.80)
LYMPHOCYTE %: 12.7 %
MCH: 28.2 pg (ref 26.0–32.0)
MCHC: 33.5 g/dL (ref 31.0–35.5)
MCV: 84.3 fL (ref 78.0–100.0)
MONOCYTE #: 0.44 x10ˆ3/uL (ref 0.20–1.10)
MONOCYTE %: 7.4 %
MPV: 9.9 fL (ref 8.7–12.5)
NEUTROPHIL #: 4.76 x10ˆ3/uL (ref 1.50–7.70)
NEUTROPHIL %: 79.5 %
PLATELETS: 145 x10ˆ3/uL — ABNORMAL LOW (ref 150–400)
RBC: 4.71 x10ˆ6/uL (ref 4.50–6.10)
RDW-CV: 12.9 % (ref 11.5–15.5)
WBC: 6 x10ˆ3/uL (ref 3.7–11.0)

## 2024-05-07 LAB — BASIC METABOLIC PANEL
ANION GAP: 8 mmol/L (ref 4–13)
BUN/CREA RATIO: 31 — ABNORMAL HIGH (ref 6–22)
BUN: 28 mg/dL — ABNORMAL HIGH (ref 8–25)
CALCIUM: 8 mg/dL — ABNORMAL LOW (ref 8.6–10.3)
CHLORIDE: 104 mmol/L (ref 96–111)
CO2 TOTAL: 21 mmol/L — ABNORMAL LOW (ref 23–31)
CREATININE: 0.9 mg/dL (ref 0.75–1.35)
GLUCOSE: 102 mg/dL (ref 65–125)
POTASSIUM: 4.4 mmol/L (ref 3.5–5.1)
SODIUM: 133 mmol/L — ABNORMAL LOW (ref 136–145)
eGFRcr - MALE: >90 mL/min/1.73mˆ2 (ref >=60)

## 2024-05-07 LAB — POC BLOOD GLUCOSE (RESULTS)
GLUCOSE, POC: 100 mg/dL — ABNORMAL HIGH (ref 70–99)
GLUCOSE, POC: 124 mg/dL — ABNORMAL HIGH (ref 70–99)

## 2024-05-07 LAB — MAGNESIUM: MAGNESIUM: 2.4 mg/dL (ref 1.8–2.6)

## 2024-05-07 LAB — POC FINGERSTICK GLUCOSE - BMC/JMC (RESULTS): GLUCOSE, POC: 102 mg/dL — ABNORMAL HIGH (ref 60–100)

## 2024-05-07 MED ORDER — SODIUM CHLORIDE 0.9 % INTRAVENOUS SOLUTION: INTRAVENOUS | Status: DC

## 2024-05-07 MED ORDER — BENZONATATE 100 MG CAPSULE: 100.0000 mg | ORAL_CAPSULE | Freq: Three times a day (TID) | ORAL | Status: AC | PRN

## 2024-05-07 MED ORDER — INSULIN LISPRO 100 UNIT/ML SUB-Q SSIP PEN
1.0000 [IU] | INJECTION | Freq: Four times a day (QID) | SUBCUTANEOUS | Status: DC
  Administered 2024-05-07 – 2024-05-08 (×4): 0 [IU] via SUBCUTANEOUS
  Administered 2024-05-08: 1 [IU] via SUBCUTANEOUS
  Administered 2024-05-09 (×3): 0 [IU] via SUBCUTANEOUS
  Filled 2024-05-07: qty 300

## 2024-05-07 MED ORDER — ONDANSETRON HCL (PF) 4 MG/2 ML INJECTION SOLUTION: 4.0000 mg | Freq: Four times a day (QID) | INTRAMUSCULAR | Status: AC | PRN

## 2024-05-07 MED ORDER — ATORVASTATIN 40 MG TABLET
40.0000 mg | ORAL_TABLET | Freq: Every evening | ORAL | Status: DC
  Administered 2024-05-07 – 2024-05-08 (×2): 40 mg via ORAL
  Filled 2024-05-07 (×2): qty 1

## 2024-05-07 MED ORDER — SERTRALINE 50 MG TABLET
50.0000 mg | ORAL_TABLET | Freq: Every day | ORAL | Status: DC
  Administered 2024-05-08 – 2024-05-09 (×2): 50 mg via ORAL
  Filled 2024-05-07 (×2): qty 1

## 2024-05-07 MED ORDER — LABETALOL 20 MG/4 ML (5 MG/ML) INTRAVENOUS SYRINGE: 10.0000 mg | INJECTION | INTRAVENOUS | Status: DC | PRN

## 2024-05-07 MED ORDER — CHOLECALCIFEROL (VITAMIN D3) 125 MCG (5,000 UNIT) TABLET: 5000.0000 [IU] | ORAL_TABLET | Freq: Every day | ORAL | Status: AC

## 2024-05-07 MED ORDER — TENECTEPLASE 25 MG INTRAVENOUS SOLUTION FOR STROKE
25.0000 mg | INTRAVENOUS | Status: AC
  Administered 2024-05-07: 25 mg via INTRAVENOUS
  Filled 2024-05-07: qty 5

## 2024-05-07 MED ORDER — DEXTROSE 40 % ORAL GEL: 15.0000 g | ORAL | Status: DC | PRN

## 2024-05-07 MED ORDER — FUROSEMIDE 20 MG TABLET
20.0000 mg | ORAL_TABLET | Freq: Every day | ORAL | Status: DC
  Administered 2024-05-07 – 2024-05-09 (×3): 20 mg via ORAL
  Filled 2024-05-07 (×3): qty 1

## 2024-05-07 MED ORDER — DEXTROSE 5% IN WATER (D5W) FLUSH BAG - 250 ML: INTRAVENOUS | Status: DC | PRN

## 2024-05-07 MED ORDER — SODIUM CHLORIDE 0.9% FLUSH BAG - 250 ML: INTRAVENOUS | Status: DC | PRN

## 2024-05-07 MED ORDER — LEVALBUTEROL 0.63 MG/3 ML SOLUTION FOR NEBULIZATION: 0.6300 mg | INHALATION_SOLUTION | Freq: Three times a day (TID) | RESPIRATORY_TRACT | Status: AC

## 2024-05-07 MED ORDER — METOPROLOL TARTRATE 50 MG TABLET
100.0000 mg | ORAL_TABLET | Freq: Two times a day (BID) | ORAL | Status: DC
  Administered 2024-05-07: 100 mg via ORAL
  Administered 2024-05-08 (×2): 0 mg via ORAL
  Administered 2024-05-09: 100 mg via ORAL
  Filled 2024-05-07 (×2): qty 2

## 2024-05-07 MED ORDER — LEVALBUTEROL 0.63 MG/3 ML SOLUTION FOR NEBULIZATION: 0.6300 mg | INHALATION_SOLUTION | RESPIRATORY_TRACT | Status: AC | PRN

## 2024-05-07 MED ORDER — SODIUM CHLORIDE 0.9 % (FLUSH) INJECTION SYRINGE: 10.0000 mL | INJECTION | INTRAMUSCULAR | Status: DC

## 2024-05-07 MED ORDER — BUDESONIDE 0.5 MG/2 ML SUSPENSION FOR NEBULIZATION
0.5000 mg | INHALATION_SUSPENSION | Freq: Two times a day (BID) | RESPIRATORY_TRACT | Status: DC
  Administered 2024-05-07 – 2024-05-08 (×3): 0.5 mg via RESPIRATORY_TRACT
  Administered 2024-05-09: 0 mg via RESPIRATORY_TRACT
  Filled 2024-05-07 (×3): qty 2

## 2024-05-07 MED ORDER — RANOLAZINE ER 500 MG TABLET,EXTENDED RELEASE,12 HR
500.0000 mg | ORAL_TABLET | Freq: Two times a day (BID) | ORAL | Status: DC
  Administered 2024-05-07 – 2024-05-09 (×4): 500 mg via ORAL
  Filled 2024-05-07 (×7): qty 1

## 2024-05-07 MED ORDER — OSELTAMIVIR 75 MG CAPSULE: 75.0000 mg | ORAL_CAPSULE | Freq: Two times a day (BID) | ORAL | Status: DC

## 2024-05-07 MED ORDER — DAPAGLIFLOZIN PROPANEDIOL 5 MG TABLET
10.0000 mg | ORAL_TABLET | Freq: Every day | ORAL | Status: DC
  Administered 2024-05-08 – 2024-05-09 (×2): 10 mg via ORAL
  Filled 2024-05-07 (×2): qty 2

## 2024-05-07 MED ORDER — EZETIMIBE 10 MG TABLET
10.0000 mg | ORAL_TABLET | Freq: Every evening | ORAL | Status: DC
  Administered 2024-05-07 – 2024-05-08 (×2): 10 mg via ORAL
  Filled 2024-05-07 (×2): qty 1

## 2024-05-07 MED ORDER — CLOPIDOGREL 75 MG TABLET
75.0000 mg | ORAL_TABLET | Freq: Every day | ORAL | Status: DC
  Administered 2024-05-09: 75 mg via ORAL
  Filled 2024-05-07: qty 1

## 2024-05-07 MED ORDER — SODIUM CHLORIDE 0.9 % (FLUSH) INJECTION SYRINGE
10.0000 mL | INJECTION | Freq: Three times a day (TID) | INTRAMUSCULAR | Status: DC
  Administered 2024-05-07 – 2024-05-08 (×2): 10 mL via INTRAVENOUS
  Administered 2024-05-08 – 2024-05-09 (×3): 0 mL via INTRAVENOUS
  Administered 2024-05-09: 10 mL via INTRAVENOUS

## 2024-05-07 MED ORDER — SODIUM CHLORIDE 0.9 % (FLUSH) INJECTION SYRINGE: 10.0000 mL | INJECTION | INTRAMUSCULAR | Status: DC | PRN

## 2024-05-07 MED ORDER — OMEGA-3 ACID ETHYL ESTERS 1 GRAM CAPSULE
2.0000 g | ORAL_CAPSULE | Freq: Two times a day (BID) | ORAL | Status: DC
  Administered 2024-05-07 – 2024-05-09 (×4): 2 g via ORAL
  Filled 2024-05-07 (×8): qty 2

## 2024-05-07 MED ORDER — GLUCAGON HCL 1 MG/ML SOLUTION FOR INJECTION: 1.0000 mg | Freq: Once | INTRAMUSCULAR | Status: DC | PRN

## 2024-05-07 MED ORDER — BUDESONIDE 0.5 MG/2 ML SUSPENSION FOR NEBULIZATION: 0.5000 mg | INHALATION_SUSPENSION | Freq: Two times a day (BID) | RESPIRATORY_TRACT | Status: AC

## 2024-05-07 MED ORDER — PANTOPRAZOLE 40 MG TABLET,DELAYED RELEASE
40.0000 mg | DELAYED_RELEASE_TABLET | Freq: Every day | ORAL | Status: DC
  Administered 2024-05-07 – 2024-05-09 (×3): 40 mg via ORAL
  Filled 2024-05-07 (×3): qty 1

## 2024-05-07 MED ORDER — METOPROLOL TARTRATE 50 MG TABLET
50.0000 mg | ORAL_TABLET | Freq: Two times a day (BID) | ORAL | Status: DC
  Filled 2024-05-07 (×2): qty 1

## 2024-05-07 MED ORDER — PREDNISONE 20 MG TABLET: 20.0000 mg | ORAL_TABLET | Freq: Two times a day (BID) | ORAL | Status: DC

## 2024-05-07 MED ORDER — ASPIRIN 81 MG TABLET,DELAYED RELEASE
81.0000 mg | DELAYED_RELEASE_TABLET | Freq: Every day | ORAL | Status: DC
  Administered 2024-05-09: 81 mg via ORAL
  Filled 2024-05-07: qty 1

## 2024-05-07 MED ORDER — ACETAMINOPHEN 1,000 MG/100 ML (10 MG/ML) INTRAVENOUS SOLUTION
1000.0000 mg | Freq: Once | INTRAVENOUS | Status: AC
  Administered 2024-05-07: 1000 mg via INTRAVENOUS
  Administered 2024-05-07: 0 mg via INTRAVENOUS
  Filled 2024-05-07: qty 100

## 2024-05-07 MED ORDER — ISOSORBIDE MONONITRATE ER 30 MG TABLET,EXTENDED RELEASE 24 HR
30.0000 mg | ORAL_TABLET | Freq: Every morning | ORAL | Status: DC
  Administered 2024-05-08: 0 mg via ORAL
  Administered 2024-05-09: 30 mg via ORAL
  Filled 2024-05-07: qty 1

## 2024-05-07 MED ORDER — IOHEXOL 350 MG IODINE/ML INTRAVENOUS SOLUTION
100.0000 mL | INTRAVENOUS | Status: AC
  Administered 2024-05-07: 100 mL via INTRAVENOUS

## 2024-05-07 MED ORDER — DEXTROSE 50 % IN WATER (D50W) INTRAVENOUS SYRINGE: 12.5000 g | INJECTION | INTRAVENOUS | Status: DC | PRN

## 2024-05-07 NOTE — Nurses Notes (Signed)
 Patient arrived to ICU 5 at this time. No gtt to verify. Attached to all monitors. Assessment, NIHSS, and admission ongoing.     Shaterrica Territo McGrogan Marr, RN

## 2024-05-07 NOTE — Nurses Notes (Signed)
 Report to Bettyann Molly [facility] with [facility] transport

## 2024-05-07 NOTE — Nurses Notes (Signed)
 Patient talking and interacting with staff appropriately. Using cellphone to call friend. Moving all extremities but grip is weaker on left and foot strength is weaker on left. Patient verbalizes that Dr. Beatris with him about TENECTEplase  and risks/benefits, verbalizes that he understands and agrees to TENECTEplase . Tele SR/VSS.

## 2024-05-07 NOTE — Consults (Signed)
 Telestroke Consult Note    Edwin [name], Edwin [name], 69 y.o. male  MRN:  [id number]     Date of Admission:  (Not on file)  Date of Birth:  1955/05/20  Date of Service:  05/07/2024    Patient/Family consent for Telemedicine- Yes  Location of Consultation: [facility] [address] [place], [place] [address]  Telestroke Consult Requested by Dr. Sonny  Time of Call: 1420  Time of Return Call: 1420  Video Connection: Yes  Technical Issues: No    Consult meets acute telestroke criteria: yes      Chief Complaint: left facial sensory change and gait ataxia     History of Present Illness:  Edwin [name] is a 69 y.o. male with a history of CAD on DAPT at home, HTN, HLD who is admitted x 2 days with flu A, getting better but while sitting in chair developed acute onset novocain-type numbness of Left face and throat, possibly dysphagia (latter resolved) and gait appreciably worse since this AM.     Noted a remote episode of transient facial numbness peri-cath billed as a TIA.    No absolute CI to IVT.      Last Known Well: 1300    PHYSICAL EXAMINATION (indirectly performed by observation via teleconference)    NIHSS (approximate)= 1     but gait affected   BP: 119/73    Modified Rankin: 0    I personally reviewed the following images.  My findings and interpretations are as follows:  CT brain w/o contrast  no ICH  CTA (if available)  P  LVO : no    Assessment:    1.  Ischemic:  Yes  2. TIA          Patient does not have thrombolytic contraindications:               Recommendations:  IV thrombolytic administration:  Yes; We discussed the risk of complications (including fatal hemorrhage) vs the chance of improving stroke outcome and that thrombolytics are the standard of care/guideline-approved treatment for acute ischemic stroke within 4.5 hours from symptom onset. All questions were answered, consensus between the Telestrokeologist, the Emergency Medicine provider, and the patient and/or their representative  was achieved. The patient and/or their representative agreed to proceed with thrombolytics.  CTA/ Endovascular Evaluation:  yes  3.   Transfer for higher level of care:  Yes Endovascular Therapy: No    Total Consult Time in Minutes: 50      Pt with acute mild disabling sx yet gait is significantly affected to the point of qualifying as disabling for the patient after a long discussion of risks, benefits and nuances with the patient who ultimately decided to get IVT. Sodium has been drifting down past few days but atypical to cause acute and focal findings. Recurrent left sided facial numbness (episode 2 since episode 1st event in 2018 is reassuring however would not account for pt's gait- r/o brainstem stroke).    Dr. Sonny also involved and he is ordering 0.25 mg/ kg max dose 25 mg TENECTEplase  IV push with post TENECTEplase  orders (timed BP/Neuro assessments). I anticipate pt will be transferred to [facility] or [facility] to complete work up. CTA pending.       Raguel MARLA Sicilian, MD  05/07/2024, 15:05

## 2024-05-07 NOTE — Nurses Notes (Signed)
 Tele-stroke completed with Dr Sonny and Dr Sydna. Pt choosing to have clot buster administered after speaking with DR Sydna d/t continued symptoms facial numbness and stating he has to concentrate more on ambulation  when up walking for Dr Sydna to asess symptoms via tele visit. Patient moved to room 67 via bed for TENECTEplase  administration. Report given to [employer] er [facility]AISER

## 2024-05-07 NOTE — Nurses Notes (Signed)
 Report called to Kirstyn Webb RN at [facility]

## 2024-05-07 NOTE — Nurses Notes (Signed)
 Naudia from [facility] called with bed assignment. ICU Room 5 to Dr. Coye service. Call report to ext. [phone]. Dr. Sonny notified. [facility] called for transport.

## 2024-05-07 NOTE — Nurses Notes (Signed)
 Patient notified of bed assignment. Patient remains A&O x4, states feeling better. Left hand grip remains weaker but improving. Patient states facial numbness improving. Remains talkative and cheerful, visitor at bedside. Tele SR/VSS

## 2024-05-07 NOTE — Nurses Notes (Signed)
 No changes, patient resting quietly in bed talking with visitor. Tele SR/VSS. Waiting for [facility] transport. GCS remains 15

## 2024-05-07 NOTE — Nurses Notes (Signed)
 Remains alert and talkative. Left hand grip with mild improvement. No s/s of bleeding. Tele SR/VSS.

## 2024-05-07 NOTE — Nurses Notes (Signed)
 No changes, VSS. Talking on phone with friend.

## 2024-05-07 NOTE — Nurses Notes (Signed)
 Received call from [facility] transfer line Elsie Garret that he had spoken to Dr. Sydna who stated that she thought all [facility] patients were to be transferred to [facility] if there was a bed available and to call him back if they could not take the patient. Dr. Odis Raring notified.

## 2024-05-07 NOTE — Nurses Notes (Signed)
 Transferred to [facility] via Health team transport. Personal belongings with patient. Patient states feeling better, assessment unchanged. GCS 15. Tele SR/VSS.

## 2024-05-07 NOTE — Nurses Notes (Signed)
 Dr. Sonny states that he has spoken to [facility] and they will see if they have a bed and can accept this patient

## 2024-05-07 NOTE — Care Plan (Signed)
 Shift Summary  Oxygen  therapy was maintained throughout the shift, with SpO2 levels fluctuating and breath sounds remaining abnormal.  Pain increased slightly during the shift, and acetaminophen  was administered PRN.  Mobility and skin protection interventions were consistently applied, and the bed remained dry with limited adhesive use.  Laboratory results indicated some abnormal values, including low sodium and albumin, and high BUN, glucose, and NT-proBNP.  Overall, oxygenation and skin integrity were supported with ongoing interventions and monitoring.    Skin Health and Integrity: Skin remained intact throughout the shift with dry linens, maximized mobility, and use of a pressure redistributing mattress; Braden score and interventions were stable, but nutrition was noted as probably inadequate.    Optimal Gas Exchange: Oxygen  therapy was maintained via nasal cannula at 2 L/min with continuous pulse oximetry; SpO2 fluctuated between 92-95% and breath sounds included expiratory wheezes and diminished sounds, with no change post-treatment.    Goal Outcome Evaluation:     Anxieties, Fears or Concerns: none (05/06/24 1910)  Individualized Care Needs: BP monitoring (05/06/24 1910)  Patient-Specific Goals (Include Timeframe): to go home (05/06/24 1910)  Plan of Care Reviewed With: patient (05/06/24 1910)     Patient Progress: no change

## 2024-05-07 NOTE — Discharge Summary (Signed)
 [facility]  DISCHARGE SUMMARY      PATIENT NAME:  Edwin [name], Edwin [name]  MRN:  [id number]  DOB:  [birth year]    ADMISSION DATE:  05/04/2024  DISCHARGE DATE:  05/07/2024    ATTENDING PHYSICIAN:  Odis Raring, DO  PRIMARY CARE PHYSICIAN: Leafy Argyle, MD         DISCHARGE DIAGNOSIS:     Active Hospital Problems    Diagnosis    Primary Problem: Influenza A    Dyspnea    Sinus tachycardia    TIA (transient ischemic attack)         DISCHARGE MEDICATIONS:     Current Discharge Medication List        START taking these medications.        Details   benzonatate  100 mg Capsule  Commonly known as: TESSALON    100 mg, Oral, EVERY 8 HOURS PRN  Refills: 0     budesonide  0.5 mg/2 mL nebulizer suspension  Commonly known as: PULMICORT  RESPULES   0.5 mg, Nebulization, 2 TIMES DAILY  Refills: 0     cholecalciferol  (Vitamin D3) 125 mcg (5,000 unit) Tablet  Start taking on: May 08, 2024   5,000 Units, Oral, Daily  Refills: 0     * levalbuterol  HCl 0.63 mg/3 mL Solution for Nebulization  Commonly known as: XOPENEX    0.63 mg, Nebulization, EVERY 4 HOURS PRN  Refills: 0     * levalbuterol  HCl 0.63 mg/3 mL Solution for Nebulization  Commonly known as: XOPENEX    0.63 mg, Nebulization, 3 TIMES DAILY  Refills: 0     ondansetron  2 mg/mL Solution  Commonly known as: ZOFRAN    4 mg, Intravenous, EVERY 6 HOURS PRN  Refills: 0     oseltamivir  75 mg Capsule  Commonly known as: TAMIFLU    75 mg, Oral, 2 TIMES DAILY  Refills: 0     predniSONE  20 mg Tablet  Commonly known as: DELTASONE    20 mg, Oral, 2 TIMES DAILY WITH FOOD  Refills: 0           * This list has 2 medication(s) that are the same as other medications prescribed for you. Read the directions carefully, and ask your doctor or other care provider to review them with you.                CONTINUE these medications which have CHANGED during your visit.        Details   isosorbide  mononitrate 30 mg Tablet Sustained Release 24 hr  Commonly known as: IMDUR   What changed: See the new instructions.   TAKE ONE  TABLET ( 30 MG ) BY MOUTH EVERY MORNING  Qty: 90 Tablet  Refills: 0     pantoprazole  40 mg Tablet, Delayed Release (E.C.)  Commonly known as: PROTONIX   What changed:   how much to take  when to take this   40 mg, Oral, Daily  Refills: 0            CONTINUE these medications - NO CHANGES were made during your visit.        Details   acetaminophen  500 mg Tablet  Commonly known as: TYLENOL    500 mg, EVERY 4 HOURS PRN  Refills: 0     aspirin  81 mg Tablet, Delayed Release (E.C.)  Commonly known as: ECOTRIN   81 mg, Oral, Daily  Refills: 0     atorvastatin  40 mg Tablet  Commonly known as: LIPITOR   40 mg, EVERY EVENING  Refills:  0     clopidogrel  75 mg Tablet  Commonly known as: PLAVIX    75 mg, Oral, Daily  Qty: 90 Tablet  Refills: 0     cyanocobalamin  1,000 mcg/mL Solution  Commonly known as: VITAMIN B12   1,000 mcg, EVERY 30 DAYS  Refills: 0     ergocalciferol  (vitamin D2) 1,250 mcg (50,000 unit) Capsule  Commonly known as: DRISDOL    50,000 Units, Oral, EVERY 3 DAYS  Refills: 0     ezetimibe  10 mg Tablet  Commonly known as: ZETIA    10 mg, EVERY EVENING  Refills: 0     Farxiga  10 mg Tablet  Generic drug: dapagliflozin  propanediol   10 mg, Oral, Daily  Qty: 90 Tablet  Refills: 3     furosemide  20 mg Tablet  Commonly known as: LASIX    20 mg, Oral, Daily  Qty: 90 Tablet  Refills: 3     ipratropium bromide  21 mcg (0.03 %) nasal spray  Commonly known as: ATROVENT    2 Sprays, 3 TIMES DAILY  Refills: 0     lactobacillus rhamnosus (GG) 10 billion cell Capsule  Commonly known as: CULTURELLE   1 Capsule, 2 TIMES DAILY WITH FOOD  Refills: 0     lidocaine  5 % Adhesive Patch, Medicated  Commonly known as: LIDODERM    1 Patch, Daily  Refills: 0     Magnesium  200 mg Tablet   Take by mouth  Refills: 0     metoprolol  tartrate 100 mg Tablet  Commonly known as: LOPRESSOR    100 mg, Oral, 2 TIMES DAILY  Qty: 180 Tablet  Refills: 0     mupirocin  2 % Ointment  Commonly known as: BACTROBAN    2 TIMES DAILY  Refills: 0     nitroGLYCERIN  0.4 mg  Tablet, Sublingual  Commonly known as: NITROSTAT    DISSOLVE 1 TABLET UNDER TONGUE AT FIRST SIGN OF CHEST PAIN AS NEEDED, MAY REPEAT EVERY 5 MINS. UP TO 3 DOSES, NO MORE THAN 3 TABS IN A 15 MIN. PERIOD  Qty: 25 Tablet  Refills: 1     Ozempic 0.25 mg or 0.5 mg (2 mg/3 mL) Pen Injector  Generic drug: semaglutide   0.5 mg, Subcutaneous, EVERY 7 DAYS  Refills: 0     polyethylene glycol 17 gram Powder in Packet  Commonly known as: MIRALAX    17 g, Daily  Refills: 0     ranolazine  500 mg Tablet Sustained Release 12 hr  Commonly known as: RANEXA    500 mg, Oral, 2 TIMES DAILY  Qty: 180 Tablet  Refills: 0     rOPINIRole  0.5 mg Tablet  Commonly known as: REQUIP    0.5 mg, 3 TIMES DAILY  Refills: 0     sertraline  50 mg Tablet  Commonly known as: ZOLOFT    50 mg, Daily  Refills: 0     Vascepa  1 gram Capsule  Generic drug: icosapent  ethyl   2 g, 2 TIMES DAILY  Refills: 0            STOP taking these medications.      albuterol  sulfate 90 mcg/actuation oral inhaler  Commonly known as: PROVENTIL  or VENTOLIN  or PROAIR      DICLOFENAC SODIUM-MENTHOL EXT     Lomotil  2.5-0.025 mg Tablet  Generic drug: diphenoxylate -atropine      losartan  25 mg Tablet  Commonly known as: COZAAR      ondansetron  4 mg Tablet, Rapid Dissolve  Commonly known as: ZOFRAN  ODT  ASK your doctor about these medications.        Details   HumaLOG  U-100 Insulin  100 unit/mL Solution  Generic drug: insulin  lispro   100 Units, Daily  Refills: 0              DISCHARGE INSTRUCTIONS:   No discharge procedures on file.        REASON FOR HOSPITALIZATION AND HOSPITAL COURSE:    69 y/o male patient with extensive PMH; CAD s/p PCI, CVA, HTN, LVH, HLD, IDDM type II, Diabetic neuropathy, OSA-non compliant with CPAP, Gilbert syndrome. Patient admitted to [facility] with Influenza A, Tachycardia, shortness of breath. CXR and CT imaging of chest did not reveal evidence of PNA. Patient started on Tamiflu  for management of influenza A and risk factors for complication. T. Bili chronic  elevation and hx of Gilbert's however T. Bili elevated above baseline. RUQ US  relatively unremarkable. LFTs improved. Troponin mildly elevated, plan to trend value with decrease down to 40. Preserved EF on ECHO without significant valvular insufficiency on initial report. Moderate hyponatremia during hospitalization, likely related to poor oral intake and responded to IV NS with value of 133 today. Hospital course complicated by acute left facial sensory deficit that occurred around 1300. CT imaging of brain showed no acute process. Tele-stroke (Dr. Sydna) consulted due to concern for TIA/CVA. Following Tele-Stroke/Neurology evaluation, mutual shared decision with patient to administer TENECTEplase . Patient received TENECTEplase , following transfer to SCU at [facility]. CTA of head/neck performed and reviewed. Plan transfer to [facility] for closer monitoring in ICU/Neurology evaluation. Patient agreeable to plan and transfer at this time.     TIA: Tele-stroke/Neurology evaluation at [facility] following acute sensory deficit with left sided facial numbness as well as gait impairment. S/p TENECTEplase , monitoring in SCU at [facility]. Plan transfer to [facility] for further evaluation and monitoring.   Influenza A: Influenza A+, Complete course of Tamiflu , end date (05/09/24).   Dyspnea: Influenza A +, CXR showed no acute process and D-dimer WNL. CT imaging of chest showed no evidence of LRI. Plan management with acute bronchitis.   Tachycardic: Likely related to influenza and fever. ECG re demonstrated sinus tachycardia. D-dimer WNL. Monitor on telemetry. ECHO obtained and results pending. Continue home cardiac medications including beta blocker.   Elevated Troponin: H/o CAD however no significant ECG changes and no reported CP or typical angina symptoms. Trend value with decrease down to 40, monitor on telemetry. Order ECHO and results pending, initial report showed preserved EF without focal wall motion abnormalities. Suspect demand ischemia due  to tachycardic and influenza A.   Hypomagnesemia: Mag level 1.6. Plan replacement 2 GM IV. Recheck level 2.4  Hyperbilirubinemia: Elevated T. Bili, hx of Gilbert's. Increased transiently with acute illness prior to improved value, currently 5.9. Likely stress response, does appear mildly jaundice. Plan to recheck CMP. No significant urobilinogen on u/a. GGT WNL and no significant obstructive pattern on LFT's. US  RUQ performed.       Copies sent to Care Team         Relationship Specialty Notifications Start End    Froylan Jester, MD PCP - General FAMILY PRACTICE  05/29/20     Phone: [phone] Fax: [phone]         [address] [place] [address]              Morene Raring, DO

## 2024-05-08 ENCOUNTER — Encounter (HOSPITAL_COMMUNITY): Payer: Self-pay | Admitting: Student in an Organized Health Care Education/Training Program

## 2024-05-08 ENCOUNTER — Inpatient Hospital Stay (HOSPITAL_COMMUNITY)

## 2024-05-08 DIAGNOSIS — J449 Chronic obstructive pulmonary disease, unspecified: Secondary | ICD-10-CM

## 2024-05-08 DIAGNOSIS — R1310 Dysphagia, unspecified: Secondary | ICD-10-CM

## 2024-05-08 DIAGNOSIS — E119 Type 2 diabetes mellitus without complications: Secondary | ICD-10-CM

## 2024-05-08 DIAGNOSIS — I639 Cerebral infarction, unspecified: Secondary | ICD-10-CM

## 2024-05-08 DIAGNOSIS — I10 Essential (primary) hypertension: Secondary | ICD-10-CM

## 2024-05-08 DIAGNOSIS — I2129 ST elevation (STEMI) myocardial infarction involving other sites: Secondary | ICD-10-CM

## 2024-05-08 DIAGNOSIS — I440 Atrioventricular block, first degree: Secondary | ICD-10-CM

## 2024-05-08 DIAGNOSIS — R9431 Abnormal electrocardiogram [ECG] [EKG]: Secondary | ICD-10-CM

## 2024-05-08 DIAGNOSIS — R000 Tachycardia, unspecified: Secondary | ICD-10-CM

## 2024-05-08 DIAGNOSIS — E1140 Type 2 diabetes mellitus with diabetic neuropathy, unspecified: Secondary | ICD-10-CM

## 2024-05-08 DIAGNOSIS — R202 Paresthesia of skin: Secondary | ICD-10-CM

## 2024-05-08 DIAGNOSIS — E785 Hyperlipidemia, unspecified: Secondary | ICD-10-CM

## 2024-05-08 DIAGNOSIS — R29818 Other symptoms and signs involving the nervous system: Secondary | ICD-10-CM

## 2024-05-08 LAB — URINALYSIS, MACRO/MICRO
BILIRUBIN: NEGATIVE mg/dL
BLOOD: NEGATIVE mg/dL
GLUCOSE: >=1000 mg/dL — AB
KETONES: NEGATIVE mg/dL
LEUKOCYTE ESTERASE: NEGATIVE WBCs/uL
NITRITE: NEGATIVE
PH: 6 (ref 5.0–8.0)
PROTEIN: NEGATIVE mg/dL
SPECIFIC GRAVITY: 1.015 (ref 1.005–1.030)
UROBILINOGEN: NEGATIVE mg/dL

## 2024-05-08 LAB — DRUG SCREEN, WITH CONFIRMATION, URINE
AMPHETAMINES, URINE: NEGATIVE
BARBITURATES URINE: NEGATIVE
BENZODIAZEPINES URINE: NEGATIVE
BUPRENORPHINE URINE: NEGATIVE
CANNABINOIDS URINE: NEGATIVE
COCAINE METABOLITES URINE: NEGATIVE
CREATININE RANDOM URINE: 49.42 mg/dL — ABNORMAL LOW (ref 50.00–100.00)
ECSTASY/MDMA URINE: NEGATIVE
FENTANYL, RANDOM URINE: NEGATIVE
METHADONE URINE: NEGATIVE
OPIATES URINE (LOW CUTOFF): NEGATIVE
OXYCODONE URINE: NEGATIVE

## 2024-05-08 LAB — CBC WITH DIFF
BASOPHIL #: <0.1 x10ˆ3/uL (ref <=0.20)
BASOPHIL %: 0.1 %
EOSINOPHIL #: <0.1 x10ˆ3/uL (ref <=0.50)
EOSINOPHIL %: 0 %
HCT: 40.5 % (ref 38.9–52.0)
HGB: 13.8 g/dL (ref 13.4–17.5)
IMMATURE GRANULOCYTE #: <0.1 x10ˆ3/uL (ref <0.10)
IMMATURE GRANULOCYTE %: 0.5 % (ref 0.0–1.0)
LYMPHOCYTE #: 1.36 x10ˆ3/uL (ref 1.00–4.80)
LYMPHOCYTE %: 18.3 %
MCH: 29.7 pg (ref 26.0–32.0)
MCHC: 34.1 g/dL (ref 31.0–35.5)
MCV: 87.3 fL (ref 78.0–100.0)
MONOCYTE #: 0.66 x10ˆ3/uL (ref 0.20–1.10)
MONOCYTE %: 8.9 %
MPV: 10.1 fL (ref 8.7–12.5)
NEUTROPHIL #: 5.38 x10ˆ3/uL (ref 1.50–7.70)
NEUTROPHIL %: 72.2 %
PLATELETS: 172 x10ˆ3/uL (ref 150–400)
RBC: 4.64 x10ˆ6/uL (ref 4.50–6.10)
RDW-CV: 13.3 % (ref 11.5–15.5)
WBC: 7.5 x10ˆ3/uL (ref 3.7–11.0)

## 2024-05-08 LAB — LIPID PANEL
CHOL/HDL RATIO: 6.2
CHOLESTEROL: 123 mg/dL (ref 100–200)
HDL CHOL: 20 mg/dL — ABNORMAL LOW (ref >=50)
LDL CALC: 65 mg/dL (ref <=100)
NON-HDL: 103 mg/dL (ref <=190)
TRIGLYCERIDES: 234 mg/dL — ABNORMAL HIGH (ref <150)
VLDL CALC: 35 mg/dL — ABNORMAL HIGH (ref <30)

## 2024-05-08 LAB — ECG 12 LEAD
Atrial Rate: 132 {beats}/min
Atrial Rate: 105 {beats}/min
Calculated P Axis: 46 degrees
Calculated P Axis: 51 degrees
Calculated R Axis: 43 degrees
Calculated R Axis: 45 degrees
Calculated T Axis: 37 degrees
Calculated T Axis: 41 degrees
PR Interval: 182 ms
PR Interval: 210 ms
QRS Duration: 78 ms
QRS Duration: 84 ms
QT Interval: 260 ms
QT Interval: 326 ms
QTC Calculation: 385 ms
QTC Calculation: 430 ms
Ventricular rate: 132 {beats}/min
Ventricular rate: 105 {beats}/min

## 2024-05-08 LAB — HGA1C (HEMOGLOBIN A1C WITH EST AVG GLUCOSE)
ESTIMATED AVERAGE GLUCOSE: 128 mg/dL
HEMOGLOBIN A1C: 6.1 % — ABNORMAL HIGH (ref 4.0–5.6)

## 2024-05-08 LAB — BASIC METABOLIC PANEL
ANION GAP: 9 mmol/L (ref 4–13)
BUN/CREA RATIO: 34 — ABNORMAL HIGH (ref 6–22)
BUN: 29 mg/dL — ABNORMAL HIGH (ref 8–25)
CALCIUM: 8.2 mg/dL — ABNORMAL LOW (ref 8.6–10.3)
CHLORIDE: 106 mmol/L (ref 96–111)
CO2 TOTAL: 22 mmol/L — ABNORMAL LOW (ref 23–31)
CREATININE: 0.86 mg/dL (ref 0.75–1.35)
GLUCOSE: 169 mg/dL — ABNORMAL HIGH (ref 65–125)
POTASSIUM: 4.4 mmol/L (ref 3.5–5.1)
SODIUM: 137 mmol/L (ref 136–145)
eGFRcr - MALE: >90 mL/min/1.73mˆ2 (ref >=60)

## 2024-05-08 LAB — HEPATIC FUNCTION PANEL
ALBUMIN: 3.2 g/dL — ABNORMAL LOW (ref 3.4–4.8)
ALKALINE PHOSPHATASE: 62 U/L (ref 45–115)
ALT (SGPT): 29 U/L (ref <43)
AST (SGOT): 52 U/L — ABNORMAL HIGH (ref 11–34)
BILIRUBIN DIRECT: 0.9 mg/dL — ABNORMAL HIGH (ref 0.1–0.4)
BILIRUBIN TOTAL: 3.6 mg/dL — ABNORMAL HIGH (ref 0.3–1.3)
PROTEIN TOTAL: 6.4 g/dL (ref 6.0–8.0)

## 2024-05-08 LAB — POC FINGERSTICK GLUCOSE - BMC/JMC (RESULTS)
GLUCOSE, POC: 122 mg/dL — ABNORMAL HIGH (ref 60–100)
GLUCOSE, POC: 122 mg/dL — ABNORMAL HIGH (ref 60–100)
GLUCOSE, POC: 133 mg/dL — ABNORMAL HIGH (ref 60–100)
GLUCOSE, POC: 232 mg/dL — ABNORMAL HIGH (ref 60–100)
GLUCOSE, POC: 193 mg/dL — ABNORMAL HIGH (ref 60–100)

## 2024-05-08 LAB — TRANSTHORACIC ECHOCARDIOGRAM - ADULT
EF VISUAL ESTIMATE: 55
EF: 60

## 2024-05-08 LAB — CREATINE KINASE (CK), TOTAL, SERUM OR PLASMA: CREATINE KINASE: 453 U/L — ABNORMAL HIGH (ref 45–225)

## 2024-05-08 LAB — PT/INR
INR: 1.24
PROTHROMBIN TIME: 14.1 s — ABNORMAL HIGH (ref 9.4–12.5)

## 2024-05-08 LAB — MAGNESIUM: MAGNESIUM: 2.2 mg/dL (ref 1.8–2.6)

## 2024-05-08 LAB — PTT (PARTIAL THROMBOPLASTIN TIME): APTT: 28.5 s (ref 24.0–36.5)

## 2024-05-08 LAB — VITAMIN B12: VITAMIN B 12: 632 pg/mL (ref 200–900)

## 2024-05-08 LAB — FOLATE: FOLATE: 6.7 ng/mL — ABNORMAL LOW (ref 7.0–31.0)

## 2024-05-08 MED ORDER — DIAZEPAM 5 MG/ML INJECTION SYRINGE
2.5000 mg | INJECTION | INTRAMUSCULAR | Status: AC
  Administered 2024-05-08: 2.5 mg via INTRAVENOUS
  Filled 2024-05-08: qty 2

## 2024-05-08 MED ORDER — ACETAMINOPHEN 325 MG TABLET
650.0000 mg | ORAL_TABLET | ORAL | Status: DC | PRN
  Administered 2024-05-08: 650 mg via ORAL
  Filled 2024-05-08: qty 2

## 2024-05-08 MED ORDER — OSELTAMIVIR 75 MG CAPSULE
75.0000 mg | ORAL_CAPSULE | Freq: Two times a day (BID) | ORAL | Status: DC
  Administered 2024-05-08 – 2024-05-09 (×3): 75 mg via ORAL
  Filled 2024-05-08 (×3): qty 1

## 2024-05-08 MED ORDER — LORAZEPAM 2 MG/ML INJECTION WRAPPER
0.5000 mg | INTRAMUSCULAR | Status: AC
  Administered 2024-05-08: 0.5 mg via INTRAVENOUS
  Filled 2024-05-08: qty 1

## 2024-05-08 MED ORDER — DEXTROMETHORPHAN-GUAIFENESIN 10 MG-100 MG/5 ML ORAL LIQUID
15.0000 mL | Freq: Four times a day (QID) | ORAL | Status: DC | PRN
  Administered 2024-05-08 – 2024-05-09 (×2): 15 mL via ORAL
  Filled 2024-05-08 (×2): qty 20
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
## 2024-05-08 NOTE — Consults (Signed)
 NIH Stroke Scale    Inital assessment  Date of EXAM: 05/08/2024        Time of EXAM: 0900  Person Administering Scale: Annmarie Lash, MD      1a.  0 Level of consciousness:     0 = Alert; keenly responsive.   1 = Not alert; but arousable by minor stimulation   2 = Not alert; requires repeated stimulation  3 = COMA   1b. 0 LOC questions:   Ask patient month/Their age.   0 = Answers both questions correctly.   1 = Answers one question correctly.   2 = Answers neither question correctly.     1c. 0 LOC commands:  Ask patient to open/close eyes.   0 = OBEYS both tasks correctly.   1 = OBEYS one task correctly.   2 = OBEYS neither task correctly.     2.  0 Best Gaze:  Only horizontal eye movements will be tested.   0 = Normal.   1 = Partial gaze palsy  2 = Forced deviation   3. 0  Visual:   0 = No visual loss.   1 = Partial hemianopia.   2 = Complete hemianopia.   3 = Bilateral hemianopia (blind including cortical blindness).       4. 0 Facial Palsy:  Ask patient to show teeth or raise eyebrows and close eyes. 0 = Normal symmetrical movements.   1 = Minor paralysis (flattened nasolabial fold, asymmetry on smiling).   2 = Partial paralysis (total or near-total paralysis of lower face).   3 = Complete paralysis of one or both sides (absence of facial movement in        the upper and lower face).     5a. 0  Motor left arm:     0 = No drift; limb holds 90 (or 45) degrees for full 10 seconds.   1 = Drift  2 = Some effort against gravity  3 = No effort against gravity  4 = No movement.   UN = UNTESTABLE(Joint fused/limb amputated)     5b.  0 Motor right arm:     0 = No drift; limb holds 90 (or 45) degrees for full 10 seconds.   1 = Drift  2 = Some effort against gravity  3 = No effort against gravity  4 = No movement.   UN = UNTESTABLE(Joint fused/limb amputated)     6a. 0 Motor left leg:     0 = No drift; leg holds 30-degree position for full 5 seconds.   1 = Drift  2 = Some effort against gravity  3 = No effort against  gravity  4 = No movement.   UN = UNTESTABLE(Joint fused/lim amputated)       6b.  0 Motor right leg:      0 = No drift; leg holds 30-degree position for full 5 seconds.   1 = Drift  2 = Some effort against gravity  3 = No effort against gravity  4 = No movement.   UN = UNTESTABLE(Joint fused/lim amputated)     7. 0 Limb Ataxia:     0 = No Ataxia  1 = Present in 1 limb.   2 = Present in 2 limbs.         8.  1 Sensory:  Use pinprick to test arms/legs/trunk/face,compare side to side.   0 = Normal; no sensory loss.   1 = Mild-to-moderate sensory loss  2 = Severe to total sensory loss     9. 0 Best Language:   Describe picture/name items/read sentences.   0 = No aphasia; normal.  1 = Mild-to-moderate aphasia  2 = Severe aphasia  3 = Mute       10. 0 Dysarthria:   (Read several words)     0 = Normal Articulation  1 = Mild-to-moderate slurring  2 = near unintelligible or unalbe to speak  UN = Intubated or other physical barrier   11.  0 Extinction and Inattention:     0 = Normal  1 = Inattention or extinction to bilateral simultaneous stimulation in one       sensory modality  2 = Severe Hemi-inattention or Hem-attention to more than one modality            Total SCORE:  1
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
## 2024-05-08 NOTE — Nurses Notes (Signed)
 Pt medicated with 0.5mg  ativan  for MRI, tolerated well.

## 2024-05-08 NOTE — Speech Evaluation (Signed)
 [facility] Services - Speech-Language Pathology   [phone] / [phone]  Inpatient Clinical Swallow Evaluation  Encounter Date: 05/08/2024     Diet recommendations:    IDDSI 7 (regular texture solids) / 0 (thin liquids)  Medication administration: Medications whole or as desired by patient.       Recommendations for oral care and swallow precautions:  Oral care: Minimum oral care every 12 hours. Perform an oral assessment once per day.  Risk management: sit upright for all intake, ensure pt is alert prior to PO intake      Recommendations for referrals or ancillary testing:  Videofluoroscopic swallow study (VFSS)? 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 Concern for exacerbation 05/07/2024.   Prior level of function: Regular texture diet and thin liquids at home. Avoids certain foods, like peanuts.  Current level of function/Current diet level: IDDSI 7/0 ordered  Patient's goal for therapy: No specific goal stated      Assessment:   No clinical signs of oral or pharyngeal dysphagia at this time. Chronic esophageal dysphagia is well-managed by his Gastroenterologist, with most recent esophageal dilation in 03/2024 in [place]. He also presents with no signs of dysarthria or aphasia, and his only remaining stroke-like symptom is mild numbness on the left side of his face (upper quadrant primarily), which he says is improving with time. Videofluoroscopic swallow study to further assess swallow physiology is not recommended. Skilled therapy  is not recommended. Swallow prognosis is excellent.      HPI/Reason for referral: Edwin [name] is a 69 y.o. transferred from [facility], where he presented with flu-like symptoms, on 05/07/24 due to emergence of stroke-like symptoms, including numbness across his left face and neck and mild dysphagia. He received TENECTEplase  yesterday afternoon and was admitted to the ICU for monitoring. Chart review indicates pt has a history of esophageal dysphagia, for which he follows with Gastroenterology in [place]. He most recent had an EGD with dilation in 03/2024, and reports no current symptoms. He was referred to SLP per stroke protocol. He had been placed on IDDSI 7/0 overnight after passing aspiration screening.     Pertinent medical history: Includes CAD s/p cardiac stenting x4 (2010), DM II, diabetic neuropathy, esophageal reflux, HTN, MI, h/o TIA without residual deficits, esophageal dysphagia (EGD with dilations noted at least in 2022, 2023, 2024, 2025), h/o cervical spine surgery  Risk factors for aspiration pneumonia (Source: Buell dunker al, 2015): N/A    BOLUS Framework (dysphagia and aspiration risk factors)  Bolus variables: No risk factors identified  Oral health and care: presence of dentures, missing teeth, or oral infections  Lifestyle and activity level: No risk factors identified  Unintended (iatrogenic) influences: No risk factors identified  System status (overall health): No risk factors identified    This patient's risk for aspiration pneumonia is rated as low.    Subjective: Pt was alert and pleasant, sitting upright in bed with breakfast tray on table in front of him. He reports no changes to his speech or difficulty thinking of words.  Occasional dry, strong cough associated with sensation of congestion, likely from influenza A infection.     Objective:  Mental Status:   Alert  Attentive  Able to communicate basic wants and needs    Vitals/Labs  Pertinent labs and interpretation: N/A    Oxygen  delivery amount/type: Room air  Vitals:    05/08/24 0645 05/08/24 0700 05/08/24 0715 05/08/24 0742   BP:  108/76     Pulse: 75 66 79    Resp: 17 17 19     Temp:       SpO2: 96% 97% 98% 97%   Weight:       Height:       BMI:         Cranial nerve exam  CN V: Continued mild numbness in left upper quadrant of face, improving overnight. No motor deficits noted.  CN VII: No focal deficits  CN IX/X: No focal deficits  CN XII: No focal deficits    Oral cavity assessment  Dentition type: Scattered natural dentition. Does not wear dentures.     Beck Oral Care Assessment:   Lips: 1 - Smooth, pink, moist and intact  Gingival and oral mucosa: 1- Smooth, pink, moist and intact  Tongue: 1 - Smooth, pink, moist and intact  Teeth: 1 - Clean, no debris  Saliva: 1 - Thin, watery, plentiful  Beck Oral Care Assessment score: No dysfunction (5)    Recommendations for frequency of oral care: Minimum oral care every 12 hours. Perform an oral assessment once per day. (5)    Laryngeal function exam  Secretions: Appears to manage independently  Vocal Quality: Normal  Maximum Phonation Time (MPT): Not tested  Pitch range: Normal conversational prosody  Cough: Strong    PO trials:  Presented: thin liquids, regular solids  Oral phase: Normal for given consistencies (including diced potatoes, scrambled eggs, and coffee via cup).  Pharyngeal phase: No coughing, choking, or throat clearing. No odynophagia or globus sensation.     Functional Oral Intake Scale (FOIS):   Current: 7  total oral intake with no restrictions  Recommended: 7  total oral intake with no restrictions    Patient/caregiver education: Discussed results and recommendations with patient, who demonstrated understanding of presented information.   Communication with care team: Results and recommendations were sent to or discussed with physician, nurse.    Plan:  Therapy needs upon discharge? None for Speech Therapy  Treatment plan: Discharge from Speech Therapy.      Electronically signed by:  Isaiah Pizza, M.S., CCC-SLP  Speech-Language Pathologist   05/08/2024, 08:52     SLP Inpatient extension [phone] / [phone]  17 minutes were spent on this encounter, including evaluation, review of medical record and diagnostic imaging, order entry, collaboration/education to members of interdisciplinary care team, and coordination of care.

## 2024-05-08 NOTE — Care Plan (Signed)
 Recommended Discharge Disposition:  Home w/o Additional Services           DME to be Obtained Prior to Discharge:  N/A - Patient has Appropriate DME Already           Recommendations for Nursing:  Standby Assist (SBA) for to the Bedside Chair, to the [facility]       If applicable, would recommend that nursing utilize [facility] equipment based on LIFT Screen to mobilize this patient; 6Clicks=20           Goals:    No Goals Set   Evaluation Only - Discharged from PT Services       Patient Information      Patient Name - Edwin [name]     Height - 172.7 cm (5' 8)      Weight - 92 kg (202 lb 13.2 oz)      Date of Birth - 05-30-1955     Room/Bed - ICU05/5     Length of Stay - 1     Date of Admission - 05/07/2024  7:32 PM     Insurance - Payor: [place] MEDICARE / Plan: [employer] MEDICARE ADVANTAGE PPO / Product Type: PPO /      Code Status - FULL CODE: ATTEMPT RESUSCITATION/CPR       Precautions      Precautions - 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 male who presents with Acute ischemic stroke (CMS HCC) [I63.9].      Patient admitted 1/20 as transfer from [facility].  To [facility] on 1/18     admitted to [facility] on 1/18 after presenting with flu like symptoms and found to be Flu A positive, he was admitted for Tamiflu  and supportive care due to multiple risk factors for deterioration. Today, patient started experiencing numbness across his left face and neck with mild dysphagia. CTA for stroke did not show and LVO, hemorrhage, stenosis or masses. Tele stroke was consulted and TENECTEplase  recommended and administered at 15:17 on  05/07/24. Patient subsequently transferred to [facility] ICU for continued post thrombolytic monitoring and work up.      Patient arrives to [facility] ICU hemodynamically stable with improved symptoms and NIHSS of 0.     Planned MRI this date     Physical Therapy Assessment - patient presents at functional baseline. Patient lives alone in 1 sh with 0 ste. 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 But reports awaiting new one.    Patient currently was Sba for transfers and mobility. Patient ambulated > 50 ft in room with sba and no device. 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  Patient moving well and at functional baseline for mobility. No further acute PT at this time.  PT recommend discharge home once medically cleared. Reports has support to check on him,      This patient's past medical history (PMH), past surgical history (PSH), and recent imaging was reviewed by this physical therapist. Please scroll to the  bottom of the note for specific HPI, PMH, PSH, and resultant imaging from the last 30 days.      Rationale for Physical Therapy Intervention - N/A - This patient would not benefit from skilled acute physical therapy services while admitted. Only evaluation is being completed at this time.
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
## 2024-05-08 NOTE — Care Management Notes (Signed)
 Initial discharge planning evaluation complete. Pt was brought here from [facility] via ambulance and states he will have to make a few phone calls, but he should be able to find a ride home. Pt uses a pharmacy in [place] but states he will use [facility] in the event that he has to stay overnight at a friends house after discharge. Please place care management consult for any known or anticipated needs. Care management will continue to follow.        05/08/24 1035   Assessment Detail   Assessment Type Admission   Date of Care Management Update 05/08/24   Social Work Plan   Discharge Planning Status initial meeting   Anticipated Discharge Disposition Home   CM to Do Follow for discharge planning   PAST SERVICES   Have you had any in-home services in the past? No   Have you had Home Health Services in the past? No   Have you been to a Skilled Nursing Facility in the past? No   Discharge Needs Assessment   Concerns To Be Addressed no discharge needs identified   Taking blood thinner/anticoagulant therapy at home? other (see comments)  (Plavix  and ASA)   Equipment Currently Used at Home medication pump;glucometer   Currently on Outpatient Dialysis? No   Transportation Available car;family or friend will provide   Referral Information   Admission Type inpatient   Arrived From home or self-care   Address Verified verified-no changes   Insurance Verified verified-no change   Source of Information Patient   Referral Source admission list   Guardianship   Appointed Healthcare Decision Maker Type: HCS  (If needed)   Name Micky Sheller   Phone Number [phone]   Employment/Military   Employment Status retired        Administrator, Arts Name [employer]  (Pharmacy at [facility] for this admission to prevent medication delay in the event that he has to stay overnight at a friend's house after discharge.)        Pharmacy Location [place], [place]   Admission Notifications   Do you want your PCP notified of your  admission? Yes   PCP Name Dr. Froylan  (States he saw his PCP 1 week ago.)   Living Environment   Lives With alone   Living Arrangements apartment  ([address] One level)   Home Safety   Home Assessment: No Problems Identified   Functional Status Prior   Ambulation 0 - independent   Transferring 0 - independent   Toileting 0 - independent   Bathing 0 - independent   Dressing 0 - independent   Eating 0 - independent   Communication 0 - understands/communicates without difficulty   Swallowing 0 - swallows foods/liquids without difficulty   Functional Status, IADL   Medications independent   Meal Preparation independent   Housekeeping independent   Laundry independent   Shopping independent   MEDICATION MANAGEMENT   Do you manage your own medications Yes
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
## 2024-05-08 NOTE — Nurses Notes (Addendum)
 Patient pass his bedside swallow evaluation at this time.Fonda Hickman, RN

## 2024-05-08 NOTE — Nurses Notes (Signed)
 Patient's NIH stroke scale is 0 at this time. Patient is on 2L NC and denied pain at this time. Fonda Hickman, RN  05/08/2024 07:33

## 2024-05-08 NOTE — Care Plan (Signed)
 [facility]  Inpatient Rehabilitation Services  Occupational Therapy Evaluation  Encounter Date: 05/08/2024     Recommended Discharge Disposition:  Home w/o Additional Services         DME to be Obtained Prior to Discharge:  N/A - Patient has Appropriate DME Already         Recommendations for Nursing:  Standby Assist (SBA) for to the Bedside Chair, to the [facility]       If applicable, would recommend that nursing utilize [facility] equipment based on LIFT Screen to mobilize this patient; 6Clicks=20      Goals:    No Goals Set   Evaluation Only - Discharged from OT Services

## 2024-05-08 NOTE — Care Management Notes (Signed)
 SS:    SW entered the patient's chart to review if there were Advanced Directives on file. The patient does not have a Cytogeneticist or Health Care Surrogate listed.  If the patient is in need of a HCS, please enter a consult for CM/SW.
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
## 2024-05-08 NOTE — Care Plan (Signed)
 Shift Summary  Acetaminophen  was administered PRN for increased head pain, which resolved to 0 by the end of the shift.  Mobility improved, with less assistance required for turning in bed and higher standardized scores documented.  Oxygen  therapy was discontinued and SpO2 values remained within a range of 92-97%.  NIH Stroke Scale and Glasgow Coma Scale scores remained stable throughout the shift.  Overall, functional independence and sensorimotor function were maintained or improved during the shift.  24 hour post TENECTEplase  MRI completed.   PT/OT ambulated patient in room.      Effective Bowel Elimination: No liquid or loose stools were reported and the last bowel movement was documented as occurring the previous day, with no elimination assistance required during the shift.    Optimal Cerebral Tissue Perfusion: Glasgow Coma Scale scores remained at 15 throughout the shift and NIH Stroke Scale scores were consistently 0, with clear speech and spontaneous eye opening documented at all time points.    Optimal Functional Ability: Mobility improved from requiring a little assistance to no assistance for turning in bed, and standardized mobility scores increased; independence in daily activities such as eating and toileting was maintained.    Effective Oxygenation and Ventilation: SpO2 values fluctuated between 92% and 97% and respiratory rates varied, with oxygen  therapy discontinued by the end of the shift and breath sounds documented in all lung fields.    Improved Sensorimotor Function: Muscle strength in all extremities was graded as active movement against gravity and resistance, and NIH Stroke Scale motor assessments showed no drift or ataxia throughout the shift.    Safe and Effective Swallow: Swallowing was documented as without difficulty, with pre-swallow screening confirming upright positioning, cough, saliva control, and lip movement.    Effective Urinary Elimination: Urine color and appearance were  yellow and clear at midday, with two occurrences of unmeasurable elimination and no genitourinary abnormalities noted.    Goal Outcome Evaluation:     Anxieties, Fears or Concerns: None (05/08/24 0800)  Individualized Care Needs: None (05/08/24 0800)  Patient-Specific Goals (Include Timeframe): None (05/08/24 0800)  Plan of Care Reviewed With: patient (05/08/24 0800)     Patient Progress: improving

## 2024-05-09 ENCOUNTER — Other Ambulatory Visit: Payer: Self-pay

## 2024-05-09 DIAGNOSIS — E119 Type 2 diabetes mellitus without complications: Secondary | ICD-10-CM

## 2024-05-09 DIAGNOSIS — I5032 Chronic diastolic (congestive) heart failure: Secondary | ICD-10-CM

## 2024-05-09 DIAGNOSIS — I639 Cerebral infarction, unspecified: Secondary | ICD-10-CM

## 2024-05-09 DIAGNOSIS — Z79899 Other long term (current) drug therapy: Secondary | ICD-10-CM

## 2024-05-09 DIAGNOSIS — R2990 Unspecified symptoms and signs involving the nervous system: Secondary | ICD-10-CM

## 2024-05-09 DIAGNOSIS — Z794 Long term (current) use of insulin: Secondary | ICD-10-CM

## 2024-05-09 DIAGNOSIS — I2510 Atherosclerotic heart disease of native coronary artery without angina pectoris: Secondary | ICD-10-CM

## 2024-05-09 DIAGNOSIS — I110 Hypertensive heart disease with heart failure: Secondary | ICD-10-CM

## 2024-05-09 DIAGNOSIS — Z9282 Status post administration of tPA (rtPA) in a different facility within the last 24 hours prior to admission to current facility: Secondary | ICD-10-CM

## 2024-05-09 DIAGNOSIS — Z7902 Long term (current) use of antithrombotics/antiplatelets: Secondary | ICD-10-CM

## 2024-05-09 DIAGNOSIS — E1142 Type 2 diabetes mellitus with diabetic polyneuropathy: Secondary | ICD-10-CM

## 2024-05-09 DIAGNOSIS — J101 Influenza due to other identified influenza virus with other respiratory manifestations: Secondary | ICD-10-CM

## 2024-05-09 DIAGNOSIS — Z7982 Long term (current) use of aspirin: Secondary | ICD-10-CM

## 2024-05-09 LAB — MANUAL DIFF AND MORPHOLOGY-SYSMEX
BASOPHIL #: <0.1 x10ˆ3/uL (ref <=0.20)
BASOPHIL %: 0 %
EOSINOPHIL #: <0.1 x10ˆ3/uL (ref <=0.50)
EOSINOPHIL %: 0 %
LYMPHOCYTE #: 1.91 x10ˆ3/uL (ref 1.00–4.80)
LYMPHOCYTE %: 27 %
MONOCYTE #: 0.64 x10ˆ3/uL (ref 0.20–1.10)
MONOCYTE %: 12 %
NEUTROPHIL #: 2.76 x10ˆ3/uL (ref 1.50–7.70)
NEUTROPHIL %: 52 %
RBC MORPHOLOGY: NORMAL
REACTIVE LYMPHOCYTE %: 9 %

## 2024-05-09 LAB — CBC W/AUTO DIFF
HCT: 41.2 % (ref 38.9–52.0)
HGB: 14 g/dL (ref 13.4–17.5)
MCH: 29.3 pg (ref 26.0–32.0)
MCHC: 34 g/dL (ref 31.0–35.5)
MCV: 86.2 fL (ref 78.0–100.0)
MPV: 10 fL (ref 8.7–12.5)
PLATELETS: 163 x10ˆ3/uL (ref 150–400)
RBC: 4.78 x10ˆ6/uL (ref 4.50–6.10)
RDW-CV: 12.9 % (ref 11.5–15.5)
WBC: 5.3 x10ˆ3/uL (ref 3.7–11.0)

## 2024-05-09 LAB — COMPREHENSIVE METABOLIC PANEL, NON-FASTING
ALBUMIN: 3.2 g/dL — ABNORMAL LOW (ref 3.4–4.8)
ALKALINE PHOSPHATASE: 59 U/L (ref 45–115)
ALT (SGPT): 29 U/L (ref <43)
ANION GAP: 7 mmol/L (ref 4–13)
AST (SGOT): 45 U/L — ABNORMAL HIGH (ref 11–34)
BILIRUBIN TOTAL: 3.5 mg/dL — ABNORMAL HIGH (ref 0.3–1.3)
BUN/CREA RATIO: 23 — ABNORMAL HIGH (ref 6–22)
BUN: 22 mg/dL (ref 8–25)
CALCIUM: 8.4 mg/dL — ABNORMAL LOW (ref 8.6–10.3)
CHLORIDE: 104 mmol/L (ref 96–111)
CO2 TOTAL: 30 mmol/L (ref 23–31)
CREATININE: 0.94 mg/dL (ref 0.75–1.35)
GLUCOSE: 100 mg/dL (ref 65–125)
POTASSIUM: 4.6 mmol/L (ref 3.5–5.1)
PROTEIN TOTAL: 6.4 g/dL (ref 6.0–8.0)
SODIUM: 141 mmol/L (ref 136–145)
eGFRcr - MALE: 88 mL/min/1.73mˆ2 (ref >=60)

## 2024-05-09 LAB — POC FINGERSTICK GLUCOSE - BMC/JMC (RESULTS)
GLUCOSE, POC: 96 mg/dL (ref 60–100)
GLUCOSE, POC: 95 mg/dL (ref 60–100)
GLUCOSE, POC: 125 mg/dL — ABNORMAL HIGH (ref 60–100)
GLUCOSE, POC: 154 mg/dL — ABNORMAL HIGH (ref 60–100)

## 2024-05-09 LAB — PHOSPHORUS: PHOSPHORUS: 2.6 mg/dL (ref 2.3–4.0)

## 2024-05-09 LAB — ADULT ROUTINE BLOOD CULTURE, SET OF 2 BOTTLES (BACTERIA AND YEAST): BLOOD CULTURE, ROUTINE: NO GROWTH

## 2024-05-09 LAB — MAGNESIUM: MAGNESIUM: 2 mg/dL (ref 1.8–2.6)

## 2024-05-09 MED ORDER — FOLIC ACID 1 MG TABLET
1.0000 mg | ORAL_TABLET | Freq: Every day | ORAL | Status: DC
  Administered 2024-05-09: 1 mg via ORAL
  Filled 2024-05-09: qty 1

## 2024-05-09 MED ORDER — FOLIC ACID 1 MG TABLET
1.0000 mg | ORAL_TABLET | Freq: Every day | ORAL | 0 refills | Status: AC
  Filled 2024-05-09: qty 30, 30d supply, fill #0

## 2024-05-09 MED ORDER — HUMALOG U-100 INSULIN 100 UNIT/ML SUBCUTANEOUS SOLUTION: SUBCUTANEOUS | Status: AC

## 2024-05-09 NOTE — Nurses Notes (Signed)
 Report called to Artist Peak at this time

## 2024-05-09 NOTE — Nurses Notes (Signed)
Patient discharged home with family.  AVS reviewed with patient/care giver.  A written copy of the AVS and discharge instructions was given to the patient/care giver. PIVs removed.  Questions sufficiently answered as needed.  Patient/care giver encouraged to follow up with PCP as indicated.  In the event of an emergency, patient/care giver instructed to call 911 or go to the nearest emergency room.
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
## 2024-05-09 NOTE — Consults (Signed)
 [facility]  Neurology Consult Follow Up      [name], [name], 69 y.o. male  Date of Service: 05/09/2024  Date of Birth:  [birth year]  Encounter Start Date: 05/07/2024  Inpatient Admission Date: 05/07/2024    Hospital Day:  LOS: 2 days       Chief Complaint:  transferred from outside facility s/p TENECTEplase  for left fascial numbness and changes in swallowing   Subjective:   Mri brain did not show findings concerning for acute stroke. Patient continues to have left fascial numbness.     Objective:  Temperature: 37 C (98.6 F)  Heart Rate: 74  BP (Non-Invasive): 122/70  Respiratory Rate: 14  SpO2: 94 %      Neuro  Mental status:  Level of Consciousness: alert  Orientations: Alert and oriented x 3  MemoryRegistration, Recall, and Following of commands is normal  AttentionsAttention and Concentration are normal  Knowledge: Good  Language: Normal  Speech: Normal  Cranial nerves:   CN2: Visual acuity and fields intact  CN 3,4,6: EOMI, PERRLA  CN 5Facial sensation intact  CN 7Face symmetrical  CN 8: Hearing grossly intact  CN 9,10: Palate symmetric and gag normal  CN 11: Sternocleidomastoid and Trapezius have normal strength.  CN 12: Tongue normal with no fasiculations or deviation  Gait, Coordination, and Reflexes:   Gait: unable to assess. Reason: fall risk   Coordination: Coordination is normal without tremor  Reflexes: trace proximal in the LE's, absent distally   Motor:   Muscle tone:  Upper and lower muscle tone is normal  Motor strength:  Motor strength is normal throughout.  Sensory:  responds to fine touch in all 4      Labs:  I have reviewed all lab results.     Review of reports and notes reveal:      Independent Interpretation of images or specimens:     CTA: ICAD     Impressions/Recommendations:     f11 years old male with  HTN, HLD,DM, CAD,on DAPT, Neuropathy presented as a transfer from [facility] after received TENECTEplase  for left fascial numbness and possible dysphagia.Patient was not an EVT  candidate due to no target. MRI brain did not report findings concerning for new stroke. I have low clinical suspicion for acute stroke. Continue home Aspirin  and Plavix  and statin. Continue PT/OT/SLP. Follow up with neurology in 3 months.  Call me back if there is any neurological change. I will continue to follow       Bethzaida Boord, MD   Assistant Professor , Department of Neurology   [facility]

## 2024-05-09 NOTE — Progress Notes (Addendum)
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     No acute intra-abdominal process. Multilevel lumbar spinal stenosis. The CT exam was performed using one or more the following a dose reduction techniques: Automated exposure control, adjustment of the mA and/or kV according to the patient's size, or use of iterative reconstruction technique. Radiologist location ID: [id number]     XR AP MOBILE CHEST  Result Date: 04/15/2024  Wells ALLEN Schaben XR AP MOBILE CHEST performed on 04/15/2024 12:20 PM. REASON FOR EXAM:  chest pain TECHNIQUE: 1 views/1 images submitted for interpretation. COMPARISON:  01/15/2024 FINDINGS: Cardiac silhouette is unremarkable. The lungs are clear without any focal infiltrate effusion or mass. Chronic elevation of the right hemidiaphragm. The hilar and mediastinal structures are unremarkable. The bony structures  are unremarkable.     No acute parenchymal process. Radiologist location ID: [id number]     Labs or imaging ordered:  5 AM labs - CBC, BMP, Mg and PO4  Medical Decision Making  All pertinent labs including the ones listed below were personally reviewed with additional follow-up lab work ordered as deemed clinically appropriate.  All available imaging, EKG and microbiology results were reviewed and independently interpreted.  Further workup was ordered and treatment was implemented appropriately based on clinical course and  medical decision making.  All the sections listed above in this document may contain only core or critical diagnostic and management elements necessary to derive assumptions and conclusions that aid in medical decision making regarding start or continuation of care of this particular patient at this particular time. Complex decisions regarding the management of the patient may change based on dynamic components of the patient's illness, any new available diagnostic data in real time, their interpretation and more importantly patient's (or medical surrogate's) decisions. Not all of the ongoing discussions, diagnostic review, treatment minutiae and decisions may be reflected in this document. Bedside medicine encompasses more information than that can be documented in its entirety at any given time. Furthermore, portions of this note may be dictated using voice recognition software or a dictation service. Variances in spelling and vocabulary are possible and unintentional. Not all errors may be caught or corrected. If there are any discrepancies noted or further clarity is deemed necessary regarding any of that documented above, please reach out to this writer directly.     Provider: Burgess Cellar, MD Date: 05/09/2024

## 2024-05-09 NOTE — Care Plan (Signed)
 Shift Summary  Fall risk assessment and interventions were initiated, including universal precautions and safety measures.  Folic acid  was administered in the morning.  Comfort measures were provided, including PRN cough medication and room temperature adjustment.  Bedside SBAR and plan of care review were completed with patient participation.  Overall, no new hospital-acquired conditions or injuries were documented, and cerebral perfusion and comfort were maintained throughout the shift.    Absence of Hospital-Acquired Illness or Injury: No new hospital-acquired conditions documented during the shift; infection prevention measures and skin protection protocols were maintained, and Braden score indicated minimal risk for skin breakdown.    Optimal Comfort and Wellbeing: Comfort measures were provided, pain was assessed as 0, and no sleep or rest problems were identified; room temperature was adjusted and cough medication was administered PRN for comfort.    Optimal Cerebral Tissue Perfusion: Glasgow Coma Scale remained at 15, with alert and oriented status maintained throughout the shift; vital signs and SOFA scores remained stable, and purposeful motor response was documented.    Absence of Fall and Fall-Related Injury: High fall risk was identified and universal fall precautions were implemented, including bed alarms, safety rounds, and fall risk bands; no falls occurred during the shift.    Knowledgeable about Health Subject/Topic: Bedside shift report participation and plan of care review with the patient were documented, and care information was communicated to the clinical care coordinator.    Goal Outcome Evaluation:     Anxieties, Fears or Concerns: numbness (05/09/24 0900)  Individualized Care Needs: none (05/09/24 0900)  Patient-Specific Goals (Include Timeframe): get better (05/09/24 0900)  Plan of Care Reviewed With: patient (05/09/24 0900)     Patient Progress: improving

## 2024-05-09 NOTE — Care Management Notes (Signed)
 05/09/24 0830   Patient Hand-Off   Clinical/Discharge Plan of Care Information Communicated to:  Clinical Care Coordinator   Comments Edwin [name] and Edwin [name]   Social Work Plan   Anticipated Discharge Disposition Home   Discharge Needs Assessment   Transportation Available family or friend will provide  (Pt lives in [place], [place] and has to arrange multiple points of transportation from friends.)   Employment/Military        Pharmacy Name [facility]     Pt is using our pharmacy due to the transportation challenge so he does not miss any doses if discharged on medication.

## 2024-05-09 NOTE — Nurses Notes (Signed)
 Walk test performed with patient per MD request. Patient's O2 sats were maintained between 97-100% on RA. No SOB noted.
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
## 2024-05-10 ENCOUNTER — Other Ambulatory Visit: Payer: Self-pay

## 2024-05-10 NOTE — Care Management Notes (Signed)
 Entered chart to verify discharge disposition.

## 2024-05-13 ENCOUNTER — Encounter (HOSPITAL_COMMUNITY): Payer: Self-pay

## 2024-05-13 ENCOUNTER — Other Ambulatory Visit: Payer: Self-pay

## 2024-05-13 ENCOUNTER — Inpatient Hospital Stay
Admission: EM | Admit: 2024-05-13 | Discharge: 2024-05-19 | Disposition: A | Attending: INTERNAL MEDICINE | Admitting: INTERNAL MEDICINE

## 2024-05-13 ENCOUNTER — Emergency Department (HOSPITAL_COMMUNITY)

## 2024-05-13 ENCOUNTER — Inpatient Hospital Stay (HOSPITAL_COMMUNITY)

## 2024-05-13 DIAGNOSIS — R000 Tachycardia, unspecified: Principal | ICD-10-CM | POA: Insufficient documentation

## 2024-05-13 DIAGNOSIS — J209 Acute bronchitis, unspecified: Secondary | ICD-10-CM

## 2024-05-13 DIAGNOSIS — Z9641 Presence of insulin pump (external) (internal): Secondary | ICD-10-CM | POA: Diagnosis present

## 2024-05-13 DIAGNOSIS — J449 Chronic obstructive pulmonary disease, unspecified: Secondary | ICD-10-CM | POA: Insufficient documentation

## 2024-05-13 DIAGNOSIS — I10 Essential (primary) hypertension: Secondary | ICD-10-CM

## 2024-05-13 DIAGNOSIS — R0600 Dyspnea, unspecified: Secondary | ICD-10-CM

## 2024-05-13 DIAGNOSIS — R0602 Shortness of breath: Secondary | ICD-10-CM | POA: Insufficient documentation

## 2024-05-13 DIAGNOSIS — J101 Influenza due to other identified influenza virus with other respiratory manifestations: Secondary | ICD-10-CM | POA: Diagnosis present

## 2024-05-13 DIAGNOSIS — R0902 Hypoxemia: Secondary | ICD-10-CM | POA: Diagnosis present

## 2024-05-13 LAB — CBC WITH DIFF
BASOPHIL #: <0.04 10*3/uL (ref <=0.20)
BASOPHIL %: 0.2 %
EOSINOPHIL #: 0.07 10*3/uL (ref <=0.50)
EOSINOPHIL %: 0.6 %
HCT: 43.2 % (ref 38.9–52.0)
HGB: 15.1 g/dL (ref 13.4–17.5)
IMMATURE GRANULOCYTE #: 0.04 10*3/uL (ref <0.10)
IMMATURE GRANULOCYTE %: 0.4 % (ref 0.0–1.0)
LYMPHOCYTE #: 2.4 10*3/uL (ref 1.00–4.80)
LYMPHOCYTE %: 22 %
MCH: 30.1 pg (ref 26.0–32.0)
MCHC: 35 g/dL (ref 31.0–35.5)
MCV: 86.1 fL (ref 78.0–100.0)
MONOCYTE #: 0.99 10*3/uL (ref 0.20–1.10)
MONOCYTE %: 9.1 %
MPV: 9.5 fL (ref 8.7–12.5)
NEUTROPHIL #: 7.37 10*3/uL (ref 1.50–7.70)
NEUTROPHIL %: 67.7 %
PLATELETS: 204 10*3/uL (ref 150–400)
RBC: 5.02 10*6/uL (ref 4.50–6.10)
RDW-CV: 12.5 % (ref 11.5–15.5)
WBC: 10.9 10*3/uL (ref 3.7–11.0)

## 2024-05-13 LAB — BASIC METABOLIC PANEL
ANION GAP: 12 mmol/L (ref 4–13)
BUN/CREA RATIO: 14 (ref 6–22)
BUN: 10 mg/dL (ref 8–25)
CALCIUM: 8.6 mg/dL (ref 8.6–10.3)
CHLORIDE: 99 mmol/L (ref 96–111)
CO2 TOTAL: 26 mmol/L (ref 23–31)
CREATININE: 0.7 mg/dL — ABNORMAL LOW (ref 0.75–1.35)
GLUCOSE: 135 mg/dL — ABNORMAL HIGH (ref 65–125)
POTASSIUM: 4.2 mmol/L (ref 3.5–5.1)
SODIUM: 137 mmol/L (ref 136–145)
eGFRcr - MALE: >90 mL/min/{1.73_m2} (ref >=60)

## 2024-05-13 LAB — URINALYSIS, MACROSCOPIC
BILIRUBIN: NEGATIVE mg/dL
BLOOD: NEGATIVE mg/dL
GLUCOSE: 70 mg/dL — AB
KETONES: NEGATIVE mg/dL
LEUKOCYTE ESTERASE: NEGATIVE WBCs/uL
NITRITE: NEGATIVE
PH: 7 (ref 5.0–8.0)
PROTEIN: 30 mg/dL — AB
SPECIFIC GRAVITY: <1.005 — ABNORMAL LOW (ref 1.005–1.030)
UROBILINOGEN: 4 mg/dL — AB

## 2024-05-13 LAB — COVID-19 ~~LOC~~ MOLECULAR LAB TESTING
INFLUENZA VIRUS TYPE A: DETECTED — AB
INFLUENZA VIRUS TYPE B: NOT DETECTED
RSV: NOT DETECTED
SARS-COV-2: NOT DETECTED

## 2024-05-13 LAB — URINALYSIS, MICROSCOPIC

## 2024-05-13 LAB — LACTIC ACID LEVEL W/ REFLEX FOR LEVEL >2.0: LACTIC ACID: 1.6 mmol/L (ref 0.5–2.2)

## 2024-05-13 LAB — TROPONIN-I: TROPONIN-I HS: 4.2 ng/L (ref <=35.0)

## 2024-05-13 MED ORDER — ACETAMINOPHEN 1,000 MG/100 ML (10 MG/ML) INTRAVENOUS SOLUTION
1000.0000 mg | INTRAVENOUS | Status: AC
  Administered 2024-05-13: 0 mg via INTRAVENOUS
  Administered 2024-05-13: 1000 mg via INTRAVENOUS
  Filled 2024-05-13: qty 100

## 2024-05-13 MED ORDER — SODIUM CHLORIDE 0.9 % IV BOLUS
1000.0000 mL | INJECTION | Status: AC
  Administered 2024-05-13: 1000 mL via INTRAVENOUS
  Administered 2024-05-13: 0 mL via INTRAVENOUS

## 2024-05-13 MED ORDER — ONDANSETRON HCL (PF) 4 MG/2 ML INJECTION SOLUTION
4.0000 mg | INTRAMUSCULAR | Status: AC
  Administered 2024-05-13: 4 mg via INTRAVENOUS
  Filled 2024-05-13: qty 2

## 2024-05-13 MED ORDER — SODIUM CHLORIDE 0.9 % INTRAVENOUS SOLUTION
INTRAVENOUS | Status: DC
  Administered 2024-05-14: 0 mL via INTRAVENOUS

## 2024-05-13 MED ORDER — ACETAMINOPHEN 1,000 MG/100 ML (10 MG/ML) INTRAVENOUS SOLUTION
1000.0000 mg | Freq: Four times a day (QID) | INTRAVENOUS | Status: DC | PRN
  Administered 2024-05-13: 0 mg via INTRAVENOUS
  Administered 2024-05-13: 1000 mg via INTRAVENOUS
  Filled 2024-05-13: qty 100

## 2024-05-13 NOTE — ED Nurses Note (Signed)
Oxygen removed

## 2024-05-13 NOTE — ED Provider Notes (Signed)
 [facility] - Emergency Department  ED Primary Note  History of Present Illness   Edwin [name] is a 69 y.o. male who had concerns including Shortness of Breath.  This is a 69 year old man with a history of diabetes type 2, MI, cardiac stents, cholecystectomy, hypertension.  He is complaining of worsening cough and shortness of breath over the last few days since being released from [facility].  He was admitted here 12 days ago, then transferred to [facility] for a possible stroke.  He was given thrombolytics prior to the transfer.  MRI showed no evidence of a stroke.  He is complaining of continued cough that produces green sputum, shortness of breath worse with exertion, fever, and nausea.  He denies other symptoms, including chest pain, leg pain or swelling, abdominal pain, change in urination or defecation, or other symptoms    Physical Exam   ED Triage Vitals   BP (Non-Invasive) 05/13/24 1524 (!) 137/100   Heart Rate 05/13/24 1524 (!) 128   Respiratory Rate 05/13/24 1524 20   Temperature 05/13/24 1524 (!) 38.7 C (101.6 F)   SpO2 05/13/24 1525 98 %   Weight 05/13/24 1522 91.6 kg (202 lb)   Height 05/13/24 1522 1.727 m (5' 8)     Constitutional:  69 y.o. male who appears in no distress. Normal color, no cyanosis.  Occasional forceful cough.  HENT:   Head: Normocephalic and atraumatic.   Mouth/Throat: Oropharynx is clear and moist.   Eyes: EOMI, PERRL   Neck: Trachea midline. Neck supple.  Cardiovascular:  Tachycardic and regular, No murmurs, rubs or gallops. Intact distal pulses.  Pulmonary/Chest: BS equal bilaterally. No respiratory distress. No wheezes, rales or chest tenderness.   Abdominal: Bowel sounds present and normal. Abdomen soft, no tenderness, no rebound and no guarding.  Back: No midline spinal tenderness, no paraspinal tenderness, no CVA tenderness.           Musculoskeletal: No edema, tenderness or deformity.  Skin: warm and dry. No rash, erythema, pallor or  cyanosis  Psychiatric: normal mood and affect. Behavior is normal.   Neurological: Patient keenly alert and responsive, easily able to raise eyebrows, facial muscles/expressions symmetric, speaking in fluent sentences, moving all extremities equally and fully    Patient Data   Labs Ordered/Reviewed - No data to display  No orders to display     Medical Decision Making       Medical Decision Making  Amount and/or Complexity of Data Reviewed  Labs: ordered.  Radiology: ordered and independent interpretation performed.  ECG/medicine tests: ordered.    Risk  Prescription drug management.    Differential diagnosis includes pneumonia, sepsis, influenza, dehydration, hyperglycemia, electrolyte abnormality, renal injury, dysrhythmia, MI  ED Course as of 05/13/24 2014   Mon May 13, 2024   1613 EKG, interpreted by me, indication shortness of breath:  Sinus tachycardia with a rate of 132 beats per minute, no acute ST-T changes, normal axis 35   1724 D/w Dr Sebastian- no floor beds currently, hold for morning   1949 Presented To Dr. Sonny, who will circle back with us    1959 Dr. Sonny states there are no available floor level beds at this time.  Board the patient here and wait for the morning.   2013 He has an occasional cough, but no other significant symptoms.  Saturating well on 3 L nasal cannula, heart rate remains high.            Medications Ordered/Administered in  the ED   acetaminophen  (OFIRMEV ) 1,000 mg (10 mg/mL) IV 100 mL (tot vol) (has no administration in time range)   ondansetron  (ZOFRAN ) 2 mg/mL injection (has no administration in time range)   acetaminophen  (OFIRMEV ) 1,000 mg (10 mg/mL) IV 100 mL (tot vol) (has no administration in time range)         Disposition: Data Unavailable       Please see my note.  GGeeting

## 2024-05-13 NOTE — ED Triage Notes (Signed)
 Patient to ED c/o SOBfor 2 days and cough persistent for 2 weeks.  EMS reports patient was 88% on RA, duo neb given en route.

## 2024-05-13 NOTE — Student (Signed)
 [facility] - Emergency Department  ED Primary Note  History of Present Illness   Edwin [name] is a 69 y.o. male who had concerns including Shortness of Breath. He has a past medical history of diabetes mellitis, hypertension, myocardial infarction, and 5 cardiac stents. He had the flu on May 04, 2024. Since then he has been having shortness of breath, he has been coughing up green phlegm, nausea, and having fever and chills. He has some chest pain, but he believes it is due to coughing. He denies any changes in bowel movement, changes in urination.     Physical Exam   ED Triage Vitals   BP (Non-Invasive) 05/13/24 1524 (!) 137/100   Heart Rate 05/13/24 1524 (!) 128   Respiratory Rate 05/13/24 1524 20   Temperature 05/13/24 1524 (!) 38.7 C (101.6 F)   SpO2 05/13/24 1525 98 %   Weight 05/13/24 1522 91.6 kg (202 lb)   Height 05/13/24 1522 1.727 m (5' 8)     Constitutional:  69 y.o. male who appears short of breath. Normal color, no cyanosis.   HENT:   Head: Normocephalic and atraumatic.   Mouth/Throat: Oropharynx is clear and moist.   Eyes: EOMI, PERRL   Neck: Trachea midline. Neck supple.  Cardiovascular: RRR, No murmurs, rubs or gallops.   Pulmonary/Chest: BS equal bilaterally. No respiratory distress. No wheezes.   Abdominal: Bowel sounds present and normal. Abdomen soft, no tenderness, no rebound and no guarding.         Musculoskeletal: No edema, tenderness or deformity.  Skin: warm and dry. No rash, erythema, pallor or cyanosis  Psychiatric: normal mood and affect. Behavior is normal.       Patient Data   Labs Ordered/Reviewed   BASIC METABOLIC PANEL - Abnormal; Notable for the following components:       Result Value    GLUCOSE 135 (*)     CREATININE 0.70 (*)     All other components within normal limits   TROPONIN-I - Normal   LACTIC ACID LEVEL W/ REFLEX FOR LEVEL >2.0 - Normal   ADULT ROUTINE BLOOD CULTURE, SET OF 2 BOTTLES (BACTERIA AND YEAST)   CBC/DIFF    Narrative:     The  following orders were created for panel order CBC/DIFF.  Procedure                               Abnormality         Status                     ---------                               -----------         ------                     CBC WITH IPQQ[[id number]]                                    Final result                 Please view results for these tests on the individual orders.   CBC WITH DIFF   BLUE TOP TUBE   EXTRA TUBES    Narrative:  The following orders were created for panel order EXTRA TUBES.  Procedure                               Abnormality         Status                     ---------                               -----------         ------                     BLUE TOP ULAZ[[id number]]                                    Final result               GOLD TOP ULAZ[[id number]]                                    In process                   Please view results for these tests on the individual orders.   GOLD TOP TUBE     XR CHEST PA AND LATERAL   ED Interpretation by Tanda Aran, MD (01/26 1641)   No acute infiltrate      Final Result by Edi, Radresults In (01/26 1745)   1. No acute process, showing no change from 1/19.    2. Chronic elevation of the right diaphragm.            Radiologist location ID: [id number]           ECG 12 LEAD   Sinus tachycardia with rate of 132 BMP.   Normal axis.   Nonspecific ST-T wave changes.       Medical Decision Making        Differentials: flu, covid, RSV, pneumonia, PE, sepsis, MI     Medical Decision Making  Amount and/or Complexity of Data Reviewed  Labs: ordered.  Radiology: ordered and independent interpretation performed.  ECG/medicine tests: ordered.    Risk  Prescription drug management.    Patient was given acetaminophen  to treat his fever. He was given ondansetron  for nausea. Normal saline bolus infusion given. Patient is still febrile and tachycardic. Continue to monitor.             Medications Ordered/Administered in the ED   acetaminophen  (OFIRMEV ) 1,000  mg (10 mg/mL) IV 100 mL (tot vol) (has no administration in time range)   ondansetron  (ZOFRAN ) 2 mg/mL injection (4 mg Intravenous Given 05/13/24 1555)   acetaminophen  (OFIRMEV ) 1,000 mg (10 mg/mL) IV 100 mL (tot vol) (0 mg Intravenous Stopped 05/13/24 1612)   NS bolus infusion 1,000 mL (1,000 mL Intravenous New Bag/New Syringe 05/13/24 1653)     Clinical Impression   Tachycardia (Primary)   Acute dyspnea       Disposition: Data Unavailable

## 2024-05-14 ENCOUNTER — Emergency Department (HOSPITAL_COMMUNITY)

## 2024-05-14 ENCOUNTER — Encounter (HOSPITAL_COMMUNITY): Payer: Self-pay

## 2024-05-14 DIAGNOSIS — R0902 Hypoxemia: Secondary | ICD-10-CM | POA: Diagnosis present

## 2024-05-14 DIAGNOSIS — Z9641 Presence of insulin pump (external) (internal): Secondary | ICD-10-CM | POA: Diagnosis present

## 2024-05-14 DIAGNOSIS — J209 Acute bronchitis, unspecified: Secondary | ICD-10-CM

## 2024-05-14 LAB — POC BLOOD GLUCOSE (RESULTS)
GLUCOSE, POC: 146 mg/dL — ABNORMAL HIGH (ref 70–99)
GLUCOSE, POC: 129 mg/dL — ABNORMAL HIGH (ref 70–99)
GLUCOSE, POC: 110 mg/dL — ABNORMAL HIGH (ref 70–99)

## 2024-05-14 MED ORDER — METOPROLOL TARTRATE 50 MG TABLET
100.0000 mg | ORAL_TABLET | Freq: Two times a day (BID) | ORAL | Status: DC
  Administered 2024-05-14 – 2024-05-16 (×5): 100 mg via ORAL
  Administered 2024-05-17: 0 mg via ORAL
  Filled 2024-05-14 (×6): qty 2

## 2024-05-14 MED ORDER — LEVALBUTEROL 0.63 MG/3 ML SOLUTION FOR NEBULIZATION
0.6300 mg | INHALATION_SOLUTION | Freq: Four times a day (QID) | RESPIRATORY_TRACT | Status: DC
  Administered 2024-05-14 – 2024-05-19 (×19): 0.63 mg via RESPIRATORY_TRACT
  Filled 2024-05-14 (×7): qty 1

## 2024-05-14 MED ORDER — ASPIRIN 81 MG CHEWABLE TABLET
81.0000 mg | CHEWABLE_TABLET | Freq: Every day | ORAL | Status: DC
  Administered 2024-05-14: 81 mg via ORAL
  Filled 2024-05-14: qty 1

## 2024-05-14 MED ORDER — IPRATROPIUM 0.5 MG-ALBUTEROL 3 MG (2.5 MG BASE)/3 ML NEBULIZATION SOLN
3.0000 mL | INHALATION_SOLUTION | RESPIRATORY_TRACT | Status: AC
  Administered 2024-05-14: 3 mL via RESPIRATORY_TRACT
  Filled 2024-05-14: qty 3

## 2024-05-14 MED ORDER — FUROSEMIDE 20 MG TABLET
20.0000 mg | ORAL_TABLET | Freq: Every day | ORAL | Status: DC
  Administered 2024-05-15 – 2024-05-17 (×3): 20 mg via ORAL
  Administered 2024-05-18: 0 mg via ORAL
  Administered 2024-05-19: 20 mg via ORAL
  Filled 2024-05-14 (×6): qty 1

## 2024-05-14 MED ORDER — LACTOBACILLUS RHAMNOSUS GG 10 BILLION CELL CAPSULE
1.0000 | ORAL_CAPSULE | Freq: Two times a day (BID) | ORAL | Status: DC
  Administered 2024-05-14 – 2024-05-19 (×10): 1 via ORAL
  Filled 2024-05-14 (×12): qty 1

## 2024-05-14 MED ORDER — IOHEXOL 350 MG IODINE/ML INTRAVENOUS SOLUTION
75.0000 mL | INTRAVENOUS | Status: AC
  Administered 2024-05-14: 75 mL via INTRAVENOUS

## 2024-05-14 MED ORDER — INSULIN PUMP - PATIENT'S OWN
1.0000 | Status: DC
  Administered 2024-05-14: 1 via SUBCUTANEOUS
  Filled 2024-05-14: qty 1

## 2024-05-14 MED ORDER — AZITHROMYCIN 500 MG TABLET
500.0000 mg | ORAL_TABLET | Freq: Every day | ORAL | Status: DC
  Administered 2024-05-15 – 2024-05-19 (×5): 500 mg via ORAL
  Filled 2024-05-14 (×6): qty 1

## 2024-05-14 MED ORDER — ASPIRIN 81 MG TABLET,DELAYED RELEASE
81.0000 mg | DELAYED_RELEASE_TABLET | Freq: Every day | ORAL | Status: DC
  Administered 2024-05-15 – 2024-05-19 (×5): 81 mg via ORAL
  Filled 2024-05-14 (×6): qty 1

## 2024-05-14 MED ORDER — GUAIFENESIN 100 MG/5 ML ORAL LIQUID
400.0000 mg | Freq: Four times a day (QID) | ORAL | Status: DC | PRN
  Administered 2024-05-14 – 2024-05-16 (×4): 400 mg via ORAL
  Filled 2024-05-14 (×4): qty 20

## 2024-05-14 MED ORDER — FOLIC ACID 1 MG TABLET
1.0000 mg | ORAL_TABLET | Freq: Every day | ORAL | Status: DC
  Administered 2024-05-14 – 2024-05-19 (×6): 1 mg via ORAL
  Filled 2024-05-14 (×7): qty 1

## 2024-05-14 MED ORDER — ISOSORBIDE MONONITRATE ER 30 MG TABLET,EXTENDED RELEASE 24 HR
30.0000 mg | ORAL_TABLET | Freq: Every day | ORAL | Status: DC
  Administered 2024-05-14: 30 mg via ORAL
  Filled 2024-05-14 (×2): qty 1

## 2024-05-14 MED ORDER — RANOLAZINE ER 500 MG TABLET,EXTENDED RELEASE,12 HR
500.0000 mg | ORAL_TABLET | Freq: Two times a day (BID) | ORAL | Status: DC
  Administered 2024-05-14 – 2024-05-19 (×10): 500 mg via ORAL
  Filled 2024-05-14 (×12): qty 1

## 2024-05-14 MED ORDER — AZITHROMYCIN 250 MG TABLET: ORAL_TABLET | ORAL | 0 refills | Status: DC

## 2024-05-14 MED ORDER — SODIUM CHLORIDE 0.9 % INJECTION SOLUTION
2.0000 g | INTRAMUSCULAR | Status: DC
  Administered 2024-05-15 – 2024-05-19 (×5): 2 g via INTRAVENOUS
  Filled 2024-05-14 (×6): qty 20

## 2024-05-14 MED ORDER — ICOSAPENT ETHYL 1 GRAM CAPSULE
2.0000 g | ORAL_CAPSULE | Freq: Two times a day (BID) | ORAL | Status: DC
  Administered 2024-05-14 – 2024-05-19 (×10): 2 g via ORAL
  Filled 2024-05-14 (×12): qty 2

## 2024-05-14 MED ORDER — METOPROLOL TARTRATE 25 MG TABLET
100.0000 mg | ORAL_TABLET | Freq: Two times a day (BID) | ORAL | Status: DC
  Administered 2024-05-14: 100 mg via ORAL
  Filled 2024-05-14 (×2): qty 4

## 2024-05-14 MED ORDER — SODIUM CHLORIDE 0.9 % (FLUSH) INJECTION SYRINGE: 10.0000 mL | INJECTION | INTRAMUSCULAR | Status: DC | PRN

## 2024-05-14 MED ORDER — SODIUM CHLORIDE 0.9% FLUSH BAG - 250 ML: INTRAVENOUS | Status: DC | PRN

## 2024-05-14 MED ORDER — GUAIFENESIN 100 MG/5 ML ORAL LIQUID
400.0000 mg | ORAL | Status: AC
  Administered 2024-05-14: 400 mg via ORAL
  Filled 2024-05-14: qty 20

## 2024-05-14 MED ORDER — CLOPIDOGREL 75 MG TABLET
75.0000 mg | ORAL_TABLET | Freq: Every day | ORAL | Status: DC
  Administered 2024-05-14: 75 mg via ORAL
  Filled 2024-05-14: qty 1

## 2024-05-14 MED ORDER — SODIUM CHLORIDE 0.9 % (FLUSH) INJECTION SYRINGE
10.0000 mL | INJECTION | Freq: Three times a day (TID) | INTRAMUSCULAR | Status: DC
  Administered 2024-05-14 – 2024-05-19 (×15): 10 mL

## 2024-05-14 MED ORDER — DAPAGLIFLOZIN PROPANEDIOL 10 MG TABLET
10.0000 mg | ORAL_TABLET | Freq: Every day | ORAL | Status: DC
  Administered 2024-05-14: 10 mg via ORAL
  Filled 2024-05-14 (×2): qty 1

## 2024-05-14 MED ORDER — IPRATROPIUM BROMIDE 0.02 % SOLUTION FOR INHALATION: 0.5000 mg | RESPIRATORY_TRACT | Status: DC | PRN

## 2024-05-14 MED ORDER — DEXTROSE 10 % IN WATER (D10W) BOLUS: 250.0000 mL | INJECTION | INTRAVENOUS | Status: DC | PRN

## 2024-05-14 MED ORDER — BENZONATATE 100 MG CAPSULE
100.0000 mg | ORAL_CAPSULE | Freq: Three times a day (TID) | ORAL | Status: DC | PRN
  Administered 2024-05-15: 100 mg via ORAL
  Filled 2024-05-14: qty 1

## 2024-05-14 MED ORDER — AMOXICILLIN 875 MG-POTASSIUM CLAVULANATE 125 MG TABLET: 1.0000 | ORAL_TABLET | Freq: Two times a day (BID) | ORAL | 0 refills | Status: DC

## 2024-05-14 MED ORDER — ISOSORBIDE MONONITRATE ER 30 MG TABLET,EXTENDED RELEASE 24 HR
30.0000 mg | ORAL_TABLET | Freq: Every morning | ORAL | Status: DC
  Administered 2024-05-15 – 2024-05-17 (×3): 30 mg via ORAL
  Administered 2024-05-18: 0 mg via ORAL
  Administered 2024-05-19: 30 mg via ORAL
  Filled 2024-05-14 (×6): qty 1

## 2024-05-14 MED ORDER — SODIUM CHLORIDE 0.9 % INTRAVENOUS SOLUTION
500.0000 mg | INTRAVENOUS | Status: AC
  Administered 2024-05-14: 0 mg via INTRAVENOUS
  Administered 2024-05-14: 500 mg via INTRAVENOUS
  Filled 2024-05-14: qty 5

## 2024-05-14 MED ORDER — PREDNISONE 10 MG TABLET: ORAL_TABLET | ORAL | 0 refills | Status: DC

## 2024-05-14 MED ORDER — EZETIMIBE 10 MG TABLET
10.0000 mg | ORAL_TABLET | Freq: Every evening | ORAL | Status: DC
  Administered 2024-05-14 – 2024-05-18 (×5): 10 mg via ORAL
  Filled 2024-05-14 (×6): qty 1

## 2024-05-14 MED ORDER — SERTRALINE 50 MG TABLET
50.0000 mg | ORAL_TABLET | Freq: Every day | ORAL | Status: DC
  Administered 2024-05-14 – 2024-05-19 (×6): 50 mg via ORAL
  Filled 2024-05-14 (×7): qty 1

## 2024-05-14 MED ORDER — SODIUM CHLORIDE 0.9 % INJECTION SOLUTION
2.0000 g | INTRAMUSCULAR | Status: AC
  Administered 2024-05-14: 2 g via INTRAVENOUS
  Filled 2024-05-14: qty 20

## 2024-05-14 MED ORDER — BUDESONIDE 0.5 MG/2 ML SUSPENSION FOR NEBULIZATION
0.5000 mg | INHALATION_SUSPENSION | Freq: Two times a day (BID) | RESPIRATORY_TRACT | Status: DC
  Administered 2024-05-14: 0.5 mg via RESPIRATORY_TRACT

## 2024-05-14 MED ORDER — ACETAMINOPHEN 325 MG TABLET
650.0000 mg | ORAL_TABLET | ORAL | Status: DC | PRN
  Administered 2024-05-15 – 2024-05-18 (×6): 650 mg via ORAL
  Filled 2024-05-14 (×6): qty 2

## 2024-05-14 MED ORDER — DEXTROSE 40 % ORAL GEL: 15.0000 g | ORAL | Status: DC | PRN

## 2024-05-14 MED ORDER — LEVALBUTEROL 0.63 MG/3 ML SOLUTION FOR NEBULIZATION: 0.6300 mg | INHALATION_SOLUTION | RESPIRATORY_TRACT | Status: DC | PRN

## 2024-05-14 MED ORDER — CLOPIDOGREL 75 MG TABLET
75.0000 mg | ORAL_TABLET | Freq: Every day | ORAL | Status: DC
  Administered 2024-05-15 – 2024-05-19 (×5): 75 mg via ORAL
  Filled 2024-05-14 (×6): qty 1

## 2024-05-14 MED ORDER — ATORVASTATIN 40 MG TABLET
40.0000 mg | ORAL_TABLET | Freq: Every evening | ORAL | Status: DC
  Administered 2024-05-14 – 2024-05-18 (×5): 40 mg via ORAL
  Filled 2024-05-14 (×6): qty 1

## 2024-05-14 MED ORDER — MELATONIN 3 MG TABLET
3.0000 mg | ORAL_TABLET | Freq: Every evening | ORAL | Status: DC | PRN
  Administered 2024-05-15 – 2024-05-18 (×4): 3 mg via ORAL
  Filled 2024-05-14 (×4): qty 1

## 2024-05-14 MED ORDER — BUDESONIDE 0.5 MG/2 ML SUSPENSION FOR NEBULIZATION
1.0000 mg | INHALATION_SUSPENSION | Freq: Two times a day (BID) | RESPIRATORY_TRACT | Status: DC
  Administered 2024-05-14 – 2024-05-19 (×10): 1 mg via RESPIRATORY_TRACT
  Filled 2024-05-14 (×3): qty 4

## 2024-05-14 MED ORDER — ALUMINUM-MAG HYDROXIDE-SIMETHICONE 200 MG-200 MG-20 MG/5 ML ORAL SUSP: 30.0000 mL | ORAL | Status: DC | PRN

## 2024-05-14 MED ORDER — BENZONATATE 100 MG CAPSULE
100.0000 mg | ORAL_CAPSULE | Freq: Three times a day (TID) | ORAL | Status: DC | PRN
  Administered 2024-05-14: 100 mg via ORAL
  Filled 2024-05-14: qty 1

## 2024-05-14 MED ORDER — ONDANSETRON HCL (PF) 4 MG/2 ML INJECTION SOLUTION
4.0000 mg | Freq: Four times a day (QID) | INTRAMUSCULAR | Status: DC | PRN
  Administered 2024-05-16: 4 mg via INTRAVENOUS
  Filled 2024-05-14: qty 2

## 2024-05-14 MED ORDER — OSELTAMIVIR 75 MG CAPSULE: 75.0000 mg | ORAL_CAPSULE | Freq: Two times a day (BID) | ORAL | Status: DC

## 2024-05-14 MED ORDER — IPRATROPIUM 0.5 MG-ALBUTEROL 3 MG (2.5 MG BASE)/3 ML NEBULIZATION SOLN
3.0000 mL | INHALATION_SOLUTION | RESPIRATORY_TRACT | Status: AC
  Administered 2024-05-14: 3 mL via RESPIRATORY_TRACT

## 2024-05-14 MED ORDER — DAPAGLIFLOZIN PROPANEDIOL 10 MG TABLET
10.0000 mg | ORAL_TABLET | Freq: Every day | ORAL | Status: DC
  Administered 2024-05-15 – 2024-05-19 (×5): 10 mg via ORAL
  Filled 2024-05-14 (×6): qty 1

## 2024-05-14 MED ORDER — ROPINIROLE 0.5 MG TABLET
0.5000 mg | ORAL_TABLET | Freq: Three times a day (TID) | ORAL | Status: DC
  Administered 2024-05-14 – 2024-05-19 (×15): 0.5 mg via ORAL
  Filled 2024-05-14 (×19): qty 1

## 2024-05-14 MED ORDER — METHYLPREDNISOLONE SOD SUCCINATE 40 MG/ML SOLUTION FOR INJ. WRAPPER
20.0000 mg | Freq: Two times a day (BID) | INTRAMUSCULAR | Status: DC
  Administered 2024-05-14 – 2024-05-15 (×2): 0 mg via INTRAVENOUS
  Filled 2024-05-14 (×2): qty 1

## 2024-05-14 MED ORDER — GLUCAGON HCL 1 MG/ML SOLUTION FOR INJECTION: 1.0000 mg | Freq: Once | INTRAMUSCULAR | Status: DC | PRN

## 2024-05-14 MED ORDER — PANTOPRAZOLE 40 MG TABLET,DELAYED RELEASE
40.0000 mg | DELAYED_RELEASE_TABLET | Freq: Every day | ORAL | Status: DC
  Administered 2024-05-14 – 2024-05-19 (×6): 40 mg via ORAL
  Filled 2024-05-14 (×7): qty 1

## 2024-05-14 MED ORDER — FUROSEMIDE 40 MG TABLET
20.0000 mg | ORAL_TABLET | Freq: Every day | ORAL | Status: DC
  Administered 2024-05-14: 20 mg via ORAL
  Filled 2024-05-14: qty 0.5

## 2024-05-14 NOTE — ED Attending Handoff Note (Addendum)
 Edwin [name]  08-May-1955  Chief Complaint   Patient presents with    Shortness of Breath       Time:  08:47 on 05/14/2024  I have assumed the care of [facility] after discussing his condition and plan of care.  HPI significant for shortness of breath.  Patient was recently diagnosed with influenza and had a TIA like symptoms where he was admitted to [facility] [id number] where he received tPA.  Patient has no neurological signs and symptoms.  Influenza here is positive rest of the lab work is negative.    Labs Ordered/Reviewed   BASIC METABOLIC PANEL - Abnormal; Notable for the following components:       Result Value    GLUCOSE 135 (*)     CREATININE 0.70 (*)     All other components within normal limits   URINALYSIS, MACROSCOPIC - Abnormal; Notable for the following components:    SPECIFIC GRAVITY <1.005 (*)     GLUCOSE 70 (*)     PROTEIN 30 (*)     UROBILINOGEN 4 (*)     All other components within normal limits    Narrative:     SSA= 1+   URINALYSIS, MICROSCOPIC - Abnormal; Notable for the following components:    BACTERIA Few (*)     All other components within normal limits   COVID-19 [place] MOLECULAR LAB TESTING - Abnormal; Notable for the following components:    INFLUENZA VIRUS TYPE A Detected (*)     All other components within normal limits    Narrative:     Results are for the simultaneous qualitative identification of SARS-CoV-2 (formerly 2019-nCoV), Influenza A, Influenza B virus and Respiratory Syncytial Virus (RSV) RNA. These etiologic agents are generally detectable in nasopharyngeal and nasal swabs during the ACUTE PHASE of infection. Hence, this test is intended to be performed on respiratory specimens collected from individuals with signs and symptoms of upper and lower respiratory tract infections who meet Centers for Disease Control and Prevention (CDC) clinical and/or epidemiological criteria for Coronavirus Disease 2019 (COVID-19) testing. CDC COVID-19 criteria for  testing on human specimens is available at [facility] webpage information for Healthcare Professionals: Coronavirus Disease 2019 (COVID-19). ([email or web address].gl).     False-negative results may occur if the virus has genomic mutations, insertions, deletions or rearrangements or if performed very early in the course of illness. Otherwise, negative results indicate virus specific RNA targets are not detected; however, negative results do not preclude SARS CoV2 (COVID-19) infection,  Influenza A, Influenza B or RSV infection. Results should not be used as the sole basis for patient management decisions. Negative results must be combined with clinical observations, patient history, and epidemiological information. If upper respiratory tract infection is still suspected based on exposure history together with other clinical findings, repeat testing should be considered.      Test methodology:  [employer] SARS -CoV-2 and Influenza A/B and RSV assay - real time reverse transcription polymerase chain reaction (RT-PCR) test on the [employer] LIAT analyzer [employer]).      [employer] SARS-Co-V-2 and Influenza A/B and RSV assay uses real-time reverse transcription polymerase chain reaction (RT-PCR) technology to rapidly (approximately 20 minutes)  detect and differentiate between SARS-CoV-2, Influenza A/B and Respiratory syncytial virus (RSV) from nasopharyngeal and nasal swabs.   POC BLOOD GLUCOSE (RESULTS) - Abnormal; Notable for the following components:    GLUCOSE, POC 146 (*)     All other components within normal limits   TROPONIN-I -  Normal   LACTIC ACID LEVEL W/ REFLEX FOR LEVEL >2.0 - Normal   ADULT ROUTINE BLOOD CULTURE, SET OF 2 BOTTLES (BACTERIA AND YEAST)   CBC/DIFF    Narrative:     The following orders were created for panel order CBC/DIFF.  Procedure                               Abnormality         Status                     ---------                               -----------          ------                     CBC WITH IPQQ[[id number]]                                    Final result                 Please view results for these tests on the individual orders.   CBC WITH DIFF   EXTRA TUBES    Narrative:     The following orders were created for panel order EXTRA TUBES.  Procedure                               Abnormality         Status                     ---------                               -----------         ------                     BLUE TOP ULAZ[[id number]]                                    Final result               GOLD TOP ULAZ[[id number]]                                    Final result                 Please view results for these tests on the individual orders.   BLUE TOP TUBE   GOLD TOP TUBE   URINALYSIS, MACROSCOPIC AND MICROSCOPIC W/CULTURE REFLEX    Narrative:     The following orders were created for panel order URINALYSIS, MACROSCOPIC AND MICROSCOPIC W/CULTURE REFLEX.  Procedure                               Abnormality         Status                     ---------                               -----------         ------  URINALYSIS, MACROSCOPIC[[id number]]      Abnormal            Final result               URINALYSIS, MICROSCOPIC[[id number]]      Abnormal            Final result                 Please view results for these tests on the individual orders.     Results for orders placed or performed during the hospital encounter of 05/13/24 (from the past 72 hours)   XR CHEST PA AND LATERAL     Status: None    Narrative    Rami ALLEN [place]    CLINICAL: cough, recent flu, fever    COMPARISON: 05/06/2024      FINDINGS:   The cardiac silhouette is within normal limits. The pulmonary vasculature is within normal limits.  The lung fields are well-expanded. No evidence of acute parenchymal process is noted. Chronic elevation of right diaphragm shows no change. The visualized bony thorax is unremarkable.      Impression    1. No acute process, showing no change from  1/19.   2. Chronic elevation of the right diaphragm.        Radiologist location ID: [id number]         Plan:    CT angio of the chest    Clinical Impression   Tachycardia (Primary)   Acute dyspnea   Influenza A       Dispo:  Currently on 2 L of oxygen .  Slightly tachycardic.  Coughing up purulent sputum.  Suspect post influenza pneumonia.  Further plan as per CT angio of the chest.  9:30 a.m.  - CT angio chest negative for PE or pneumonia  -  Patient dropped to 86% off oxygen   - On 2 L his saturation is 96%  - case discussed with [facility]  -will admit him under observation   -he is requesting to be admitted as he has no body at home to help him out    Cheryll Riggs, MD  05/14/2024, 08:47

## 2024-05-14 NOTE — Care Management Notes (Signed)
 SS received a consult for pt. MSW was informed pt was being DC to home today and needed home O2 set up. Dr's note stating he desated to 86% on RA. MSW ordered home O2 with pt for [facility]. However, it appears pt will be admitted under OBS at this time. SS will follow pt on OBS chart and will set up home O2 when medically ready for DC.

## 2024-05-14 NOTE — ED Notes (Signed)
 PT placed back on 2L PT was going down to 86% on room air.

## 2024-05-14 NOTE — ED Notes (Addendum)
 9264: patient assessment complete--see chart, patient unable to be weaned from O2, patient desaturates, patient continue on [facility], patient continue on continuous cardiac monitor, linens changed on patients bed, call light in reach, all needs met at this time.     9158: patient sitting on edge of bed eating breakfast.     1512: report given to Mitzie Leech, RN patient to 82

## 2024-05-14 NOTE — Care Plan (Signed)
 Shift Summary  Antibiotics and respiratory medications were administered to support gas exchange.   Fall risk and infection prevention protocols were implemented and acknowledged.   Pain remained well controlled and comfort measures were maintained.   Overall, vital signs and comfort improved and safety measures were consistently followed.     Absence of Hospital-Acquired Illness or Injury: Fall risk interventions and universal precautions were implemented after a high fall risk assessment, and infection prevention education was provided and acknowledged; hourly safety rounds were completed and no new hospital-acquired issues were documented during the shift.     Optimal Comfort and Wellbeing: Pain remained at 0 throughout the shift and no new or chronic pain was reported, with positioning assistance provided and comfort level documented as acceptable.     Optimal Gas Exchange: Oxygen  saturation dropped to 86% in the ER briefly on room air but improved to 98% by the end of the shift with nasal cannula at 2L/min; respiratory rate and pulse trended downward and stabilized.    Goal Outcome Evaluation:     Anxieties, Fears or Concerns: none voiced (05/14/24 1543)  Individualized Care Needs: O2 2L NC (05/14/24 1543)  Patient-Specific Goals (Include Timeframe): get better and go home (05/14/24 1543)  Plan of Care Reviewed With: (not recorded)     Patient Progress: no change
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
## 2024-05-15 ENCOUNTER — Inpatient Hospital Stay (HOSPITAL_COMMUNITY)

## 2024-05-15 ENCOUNTER — Ambulatory Visit (INDEPENDENT_AMBULATORY_CARE_PROVIDER_SITE_OTHER): Payer: Self-pay

## 2024-05-15 ENCOUNTER — Other Ambulatory Visit: Payer: Self-pay

## 2024-05-15 LAB — CBC WITH DIFF
BASOPHIL #: <0.04 10*3/uL (ref <=0.20)
BASOPHIL %: 0.3 %
EOSINOPHIL #: 0.15 10*3/uL (ref <=0.50)
EOSINOPHIL %: 1.5 %
HCT: 36.1 % — ABNORMAL LOW (ref 38.9–52.0)
HGB: 12.3 g/dL — ABNORMAL LOW (ref 13.4–17.5)
IMMATURE GRANULOCYTE #: 0.09 10*3/uL (ref <0.10)
IMMATURE GRANULOCYTE %: 0.9 % (ref 0.0–1.0)
LYMPHOCYTE #: 1.65 10*3/uL (ref 1.00–4.80)
LYMPHOCYTE %: 17 %
MCH: 30.2 pg (ref 26.0–32.0)
MCHC: 34.1 g/dL (ref 31.0–35.5)
MCV: 88.7 fL (ref 78.0–100.0)
MONOCYTE #: 0.8 10*3/uL (ref 0.20–1.10)
MONOCYTE %: 8.2 %
MPV: 9.1 fL (ref 8.7–12.5)
NEUTROPHIL #: 6.99 10*3/uL (ref 1.50–7.70)
NEUTROPHIL %: 72.1 %
PLATELETS: 156 10*3/uL (ref 150–400)
RBC: 4.07 10*6/uL — ABNORMAL LOW (ref 4.50–6.10)
RDW-CV: 12.7 % (ref 11.5–15.5)
WBC: 9.7 10*3/uL (ref 3.7–11.0)

## 2024-05-15 LAB — BASIC METABOLIC PANEL
ANION GAP: 8 mmol/L (ref 4–13)
BUN/CREA RATIO: 16 (ref 6–22)
BUN: 13 mg/dL (ref 8–25)
CALCIUM: 8.2 mg/dL — ABNORMAL LOW (ref 8.6–10.3)
CHLORIDE: 102 mmol/L (ref 96–111)
CO2 TOTAL: 24 mmol/L (ref 23–31)
CREATININE: 0.8 mg/dL (ref 0.75–1.35)
GLUCOSE: 146 mg/dL — ABNORMAL HIGH (ref 65–125)
POTASSIUM: 4.4 mmol/L (ref 3.5–5.1)
SODIUM: 134 mmol/L — ABNORMAL LOW (ref 136–145)
eGFRcr - MALE: >90 mL/min/{1.73_m2} (ref >=60)

## 2024-05-15 LAB — POC BLOOD GLUCOSE (RESULTS)
GLUCOSE, POC: 185 mg/dL — ABNORMAL HIGH (ref 70–99)
GLUCOSE, POC: 164 mg/dL — ABNORMAL HIGH (ref 70–99)
GLUCOSE, POC: 136 mg/dL — ABNORMAL HIGH (ref 70–99)
GLUCOSE, POC: 140 mg/dL — ABNORMAL HIGH (ref 70–99)

## 2024-05-15 NOTE — Care Plan (Signed)
 Shift Summary  Oxygen  therapy was maintained and SpO2 improved slightly during the shift.  Breath sounds remained diminished bilaterally with crackles and did not change after treatment.  High fall risk interventions were implemented and safety rounds were consistently completed with no falls or injuries documented.  guaifenesin  was administered PRN to support respiratory status.  Overall, respiratory status and safety measures were closely monitored throughout the shift.    Optimal Gas Exchange: Oxygen  therapy was maintained via nasal cannula at 2 L/min with SpO2 improving slightly from 95% to 96% during the shift; breath sounds remained diminished bilaterally with crackles and no change post-treatment, and guaifenesin  was administered PRN to support respiratory status.    Absence of Fall and Fall-Related Injury: High fall risk interventions were implemented, including bed alarms, safety rounds, and activity supervision, with no falls or injuries documented during the shift.    Goal Outcome Evaluation:     Anxieties, Fears or Concerns: none (05/14/24 1915)  Individualized Care Needs: O2 2L NC (05/14/24 1915)  Patient-Specific Goals (Include Timeframe): to get better (05/14/24 1915)  Plan of Care Reviewed With: (not recorded)     Patient Progress: no change

## 2024-05-15 NOTE — Care Plan (Signed)
 Shift Summary  Oxygen  therapy was maintained at 2L via nasal cannula with continuous monitoring, and SpO2 remained above 92% throughout the shift.   Azithromycin  and cefTRIAXone  were administered during the morning.   Blood glucose levels were elevated at three points and RN was notified each time.   Fall prevention measures were consistently implemented, and no falls or injuries occurred.   Overall, comfort and emotional wellbeing were maintained, and patient demonstrated improved balance and safe mobility.     Optimal Comfort and Wellbeing: No pain was reported throughout the shift, and sleep/rest was not identified as a problem; emotional state remained calm and cooperative with acceptance consistently documented.     Optimal Gas Exchange: Oxygen  was delivered via nasal cannula at 2L with continuous pulse oximetry; SpO2 values remained above 92% and respiratory status was stable throughout the shift.     Absence of Fall and Fall-Related Injury: Fall prevention interventions were maintained, patient wore nonskid slippers and fall risk band, and no falls or injuries occurred; balance improved from fair+ to good and patient was able to ambulate without assistance.     Goal Outcome Evaluation:     Anxieties, Fears or Concerns: (not recorded)  Individualized Care Needs: (not recorded)  Patient-Specific Goals (Include Timeframe): (not recorded)  Plan of Care Reviewed With: (not recorded)     Patient Progress: no change
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
## 2024-05-15 NOTE — Progress Notes (Signed)
 [facility]  General Medicine  Progress Note    Date of Service:  05/15/2024  Edwin [name], 69 y.o. male  Encounter Start Date:  05/13/2024  Hospital Length of Stay:  LOS: 1 day    Inpatient Admission Date: 05/14/2024  Date of Birth:  1955/04/19  PCP: Edwin Argyle, MD    DNR Status:  FULL CODE: ATTEMPT RESUSCITATION/CPR    Chief Complaint:  Dyspnea with hypoxia    HPI: Edwin [name] is a 69 y.o. male with past medical history of type II DM with insulin  pump, MI s/p PCI, cholecystectomy, HTN, and recent concern for CVA who presents with worsening cough, dyspnea, and hypoxia. He was admitted here 13 days ago with influenza A, then transferred to [facility] for possible CVA. TENECTEplase  was administered prior to transfer. MRI showed no evidence of CVA. Patient reports continued cough that produces green sputum, SOB, fever, and nausea. CTA chest is negative for PE and CXR shows no acute process. Patient remains hypoxic on RA and is requiring 2L NC to maintain O2 saturations >90%. Lab work-up is unremarkable. Patient will be admitted for further work-up and treatment of acute bronchitis with hypoxic respiratory failure likely secondary to Flu A.  Patient is still requiring oxygen  today.  Clinically not feeling any better.  Could not tolerate steroids due to previous history of panic attacks.  Labs and chest x-ray are stable.  We will continue current medical treatment    ROS:   Gen:  denies fever, chills, weight loss or falls  HENT:  denies sore throat, head trauma or headache   CV:  denies chest pain, palpitations  Resp:  denies cough, shortness of breath or DOE  GI:  denies nausea, vomiting, diarrhea, melena or hematemesis   Neuro:  denies weakness, slurred speech   Psych:  denies anxiety, depression, SI or HI       Vital Signs:  Temp (24hrs) Max:38 C (100.4 F)      Systolic (24hrs), Avg:116 , Min:102 , Max:130     Diastolic (24hrs), Avg:70, Min:56, Max:96    Temp  Avg: 37 C (98.6 F)  Min: 36.5 C  (97.7 F)  Max: 38 C (100.4 F)  MAP (Non-Invasive)  Avg: 91.5 mmHG  Min: 77 mmHG  Max: 106 mmHG  Pulse  Avg: 81.6  Min: 77  Max: 88  Resp  Avg: 18  Min: 17  Max: 20  SpO2  Avg: 95.2 %  Min: 93 %  Max: 98 %     Fi02    Examination:  Gen:  chronically ill 69 y.o. White male appears acutely ill  HENT:  mmm  CV:  regular rate and rhythm   Resp:  Mild diffuse rhonchi/wheezes  Abd:  soft, non tender, bowel sounds present  Ext:  no edema, distal pulses in tact  Neuro:  non focal, alert and oriented, moves all 4   Pscyh:  normal mood and affect       I/O:  I/O last 24 hours:    Intake/Output Summary (Last 24 hours) at 05/15/2024 1057  Last data filed at 05/15/2024 0913  Gross per 24 hour   Intake 720 ml   Output 1500 ml   Net -780 ml     I/O current shift:  01/28 0700 - 01/28 1859  In: 260 [P.O.:240]  Out: -   Blood Sugars:       Labs:    I have reviewed all lab results.  Lab Results Today:  Results for orders placed or performed during the hospital encounter of 05/13/24 (from the past 24 hours)   POC BLOOD GLUCOSE (RESULTS)   Result Value Ref Range    GLUCOSE, POC 129 (H) 70 - 99 mg/dl   POC BLOOD GLUCOSE (RESULTS)   Result Value Ref Range    GLUCOSE, POC 110 (H) 70 - 99 mg/dl   BASIC METABOLIC PANEL   Result Value Ref Range    SODIUM 134 (L) 136 - 145 mmol/L    POTASSIUM 4.4 3.5 - 5.1 mmol/L    CHLORIDE 102 96 - 111 mmol/L    CO2 TOTAL 24 23 - 31 mmol/L    ANION GAP 8 4 - 13 mmol/L    CALCIUM 8.2 (L) 8.6 - 10.3 mg/dL    GLUCOSE 853 (H) 65 - 125 mg/dL    BUN 13 8 - 25 mg/dL    CREATININE 9.19 9.24 - 1.35 mg/dL    eGFRcr - MALE >09 >=39 mL/min/1.5m2    BUN/CREA RATIO 16 6 - 22   CBC WITH DIFF   Result Value Ref Range    WBC 9.7 3.7 - 11.0 x103/uL    RBC 4.07 (L) 4.50 - 6.10 x106/uL    HGB 12.3 (L) 13.4 - 17.5 g/dL    HCT 63.8 (L) 61.0 - 52.0 %    MCV 88.7 78.0 - 100.0 fL    MCH 30.2 26.0 - 32.0 pg    MCHC 34.1 31.0 - 35.5 g/dL    RDW-CV 87.2 88.4 - 84.4 %    PLATELETS 156 150 - 400 x103/uL    MPV 9.1 8.7 - 12.5 fL     NEUTROPHIL % 72.1 %    LYMPHOCYTE % 17.0 %    MONOCYTE % 8.2 %    EOSINOPHIL % 1.5 %    BASOPHIL % 0.3 %    NEUTROPHIL # 6.99 1.50 - 7.70 x103/uL    LYMPHOCYTE # 1.65 1.00 - 4.80 x103/uL    MONOCYTE # 0.80 0.20 - 1.10 x103/uL    EOSINOPHIL # 0.15 <=0.50 x103/uL    BASOPHIL # <0.04 <=0.20 x103/uL    IMMATURE GRANULOCYTE % 0.9 0.0 - 1.0 %    IMMATURE GRANULOCYTE # 0.09 <0.10 x103/uL   POC BLOOD GLUCOSE (RESULTS)   Result Value Ref Range    GLUCOSE, POC 185 (H) 70 - 99 mg/dl       Imaging Studies:    Results for orders placed or performed during the hospital encounter of 05/13/24 (from the past 2160 hours)   XR CHEST PA AND LATERAL     Status: None    Narrative    Edwin [name]    CLINICAL: cough, recent flu, fever    COMPARISON: 05/06/2024      FINDINGS:   The cardiac silhouette is within normal limits. The pulmonary vasculature is within normal limits.  The lung fields are well-expanded. No evidence of acute parenchymal process is noted. Chronic elevation of right diaphragm shows no change. The visualized bony thorax is unremarkable.      Impression    1. No acute process, showing no change from 1/19.   2. Chronic elevation of the right diaphragm.        Radiologist location ID: [id number]     CT ANGIO CHEST FOR PULMONARY EMBOLUS W IV CONTRAST     Status: None    Narrative    Edwin [name]    CLINICAL HISTORY: sob    COMPARISON:  Chest  radiograph of 05/13/2024 and chest CT of 05/04/2024    TECHNIQUE:  Multiplanar as well as maximum intensity pixel (3D/MIP) reconstructions were performed.      FINDINGS:  Examination shows unremarkable thoracic inlet.  The axillary regions are unremarkable.   Hilar and mediastinal structures are normal.    Normal heart size is noted.    Severe diffuse coronary artery calcifications are noted.    Normal thoracic aorta is noted.  Normal pulmonary arteries are noted.  No evidence of pulmonary embolus is seen.    Well expanded lungs are noted showing no evidence of acute  parenchymal process.  The pleural spaces are unremarkable.   Moderate chronic elevation of the right diaphragm is present.  The subdiaphragmatic region is unremarkable.      Chest wall and bony thorax are unremarkable.        Impression    1. No evidence of pulmonary embolus is noted.    2. Severe diffuse coronary artery calcifications and moderate chronic right diaphragmatic elevation are present.                      Radiologist location ID: [id number]     XR CHEST AP     Status: None    Narrative    Edwin [name]    XR CHEST AP performed on 05/15/2024 5:17 AM.    REASON FOR EXAM:  Cough, Hypoxia     TECHNIQUE: 1 views/1 images submitted for interpretation.    COMPARISON:  05/13/2024    FINDINGS:   Cardiac silhouette is unremarkable.  Chronic elevation of the right hemidiaphragm with compressive atelectasis of the right lung base.  The hilar and mediastinal structures are unremarkable.  The bony structures are unremarkable.      Impression    No acute parenchymal process.            Radiologist location ID: [id number]            IPMEDS:  acetaminophen  (TYLENOL ) tablet, 650 mg, Oral, Q4H PRN  aluminum -magnesium  hydroxide-simethicone  (MAG-AL PLUS) 200-200-20 mg per 5 mL oral liquid, 30 mL, Oral, Q4H PRN  aspirin  (ECOTRIN) enteric coated tablet 81 mg, 81 mg, Oral, Daily  atorvastatin  (LIPITOR) tablet, 40 mg, Oral, QPM  azithromycin  (ZITHROMAX ) tablet, 500 mg, Oral, Daily  benzonatate  (TESSALON ) capsule, 100 mg, Oral, Q8H PRN  budesonide  (PULMICORT  RESPULES) 0.5 mg/2 mL nebulizer suspension, 1 mg, Nebulization, 2x/day  cefTRIAXone  (ROCEPHIN ) 2 g in NS 20 mL IV injection, 2 g, Intravenous, Q24H  clopidogrel  (PLAVIX ) 75 mg tablet, 75 mg, Oral, Daily  D10W bolus infusion 250 mL, 250 mL, Intravenous, Q1H PRN  dapagliflozin  (FARXIGA ) tablet, 10 mg, Oral, Daily  dextrose  (GLUTOSE) 40% oral gel, 15 g, Oral, Q15 Min PRN  ezetimibe  (ZETIA ) tablet, 10 mg, Oral, QPM  folic acid  (FOLVITE ) tablet, 1 mg, Oral,  Daily  furosemide  (LASIX ) tablet, 20 mg, Oral, Daily  glucagon  injection 1 mg, 1 mg, IntraMUSCULAR, Once PRN  guaiFENesin  100mg  per 5mL oral liquid - for cough (expectorant), 400 mg, Oral, Q6H PRN  icosapent  ethyl (VASCEPA ) capsule, 2 g, Oral, 2x/day  Insulin  Medication Pump - Patient's own, 1 Each, Subcutaneous, Continuous  ipratropium (ATROVENT ) 0.02% nebulizer solution, 0.5 mg, Nebulization, Q4H PRN  isosorbide  mononitrate (IMDUR ) 24 hr extended release tablet, 30 mg, Oral, QAM  lactobacillus rhamnosus (CULTURELLE) active cultures capsule, 1 Capsule, Oral, 2x/day-Food  levalbuterol  (XOPENEX ) 0.63 mg/ 3 mL nebulizer solution, 0.63 mg, Nebulization, Q4H PRN  levalbuterol  (XOPENEX ) 0.63 mg/ 3  mL nebulizer solution, 0.63 mg, Nebulization, 4x/day  melatonin tablet, 3 mg, Oral, HS PRN  metoprolol  tartrate (LOPRESSOR ) tablet, 100 mg, Oral, 2x/day  NS 250 mL flush bag, , Intravenous, Q15 Min PRN  NS flush syringe, 10 mL, Intracatheter, Q8HRS  NS flush syringe, 10 mL, Intracatheter, Q1H PRN  ondansetron  (ZOFRAN ) 2 mg/mL injection, 4 mg, Intravenous, Q6H PRN  pantoprazole  (PROTONIX ) delayed release tablet, 40 mg, Oral, Daily  ranolazine  (RANEXA ) extended release tablet, 500 mg, Oral, 2x/day  rOPINIRole  (REQUIP ) tablet, 0.5 mg, Oral, 3x/day  sertraline  (ZOLOFT ) tablet, 50 mg, Oral, Daily        Assessment/Plan:   Active Hospital Problems    Diagnosis    Primary Problem: Hypoxia    Acute bronchitis    Insulin  pump in place    Influenza A       Acute Bronchitis Hypoxic Respiratory Failure  - Treat with empiric Rocephin , Azithromycin , Solu-Medrol , nebs, and Tessalon . O2 2L; wean as tolerated. Follow daily CXR.  Patient still feels bad today.  Still short of breath with any activity.  Still requiring oxygen .  Still showing signs of rhonchi and wheezes.  Unable to tolerate steroids due to previous history of panic attacks     Influenza A  - Tested positive on 05/04/24. Completed course of Tamiflu .  Continue current medical  treatment plan     Chronic medical conditions:   CAD: S/p PCI, no reported CP or typical angina symptoms. Cardiac enzymes negative in ED. Resume home medical management for CAD and associated risk factors.   H/o CVA: Recent concern for CVA vs TIA; received TENECTEplase  and transferred to [facility]. No evidence of CVA on MRI. Resume DAPT and Statin.   HTN: Monitor scheduled vitals. Resume home antihypertensive medications.   HLD: Continue Statin.   IDDM type II: On Ozempic and administrated q Thursday which will be held during admission. Resume SGLT-2 inhibitor. On insulin  pump, monitor FSBS closely.  Sugars remain under good control  GERD: On PPI   Mixed anxiety/depression: Continue Zoloft  daily  RLS: On Requip  TID      DVT/PE Prophylaxis: Plavix  and aspirin     Garnette JAYSON Hummer, DO  Hospitalist - [facility]

## 2024-05-15 NOTE — Care Management Notes (Signed)
 [facility]  Care Management Initial Evaluation    Patient Name: Edwin [name]  Date of Birth: Aug 03, 1955  Sex: male  Date/Time of Admission: 05/13/2024  3:20 PM  Room/Bed: 52/A  Payor: [place] MEDICARE / Plan: [place] MEDICARE ADVANTAGE PPO / Product Type: PPO /   Primary Care Providers:  Froylan Jester, MD, MD (General)    Pharmacy Info:   Preferred Pharmacy       [facility] - [place], [place] - [address]    [address] [place] [address]    Phone: [phone] Fax: [phone]    Hours: Not open 24 hours    The Pharmacy at [facility] [place] [address]    Phone: [phone] Fax: [phone]    Hours: Monday 7:30AM-8PM, Tuesday-Friday 7:30AM-6PM, Saturday 9AM-3PM, "Sunday Closed          Emergency Contact Info:   Extended Emergency Contact Information  Primary Emergency Contact: Hamelin,[name]           [place], [place] [address] [place]  Mobile Phone: [phone]  Relation: Brother  Preferred language: English    History:   Edwin [name] is a 68 y.o., male, admitted 05/14/2024.    Height/Weight: 172.7 cm (5' 8) / 95.4 kg (210 lb 4.8 oz)     LOS: 1 day   Admitting Diagnosis: Hypoxia [R09.02]    Assessment:      01" /28/26 1000   Assessment Detail   Assessment Type Admission   Date of Care Management Update 05/15/24   Readmission   Is this a readmission? Yes   Is this a scheduled readmission? No   During your last admission, did you name someone who would be helping you at home (Lay Caregiver)? No   On a scale of 1-10, with 10 being very well and 1 being not at all, how well did you understand your discharge instructions and goals of care from your prior admission? 8   IF YOU WERE DISCHARGED TO HOME AFTER YOUR PRIOR ADMISSION, DID YOU HAVE ANY DISCHARGE RESOURCES SUCH AS:? No   Have you followed the recommended medication instructions that were given following your prior admission? Yes   Were you able to make your hospital follow-up  appointments? No   If answered no, why? Appointment date is in the future  (Pt was admitted to another facility shortly after DC.)   Did you reach out to a healthcare provider before returning to the hospital? No   If answered no, why? Did not feel like provider could help   What factors contributed to the patient's rehospitalization? (TO BE ANSWERED BY DCP) Another unrelated disease process   Social Work Plan   Discharge Planning Status initial meeting   Anticipated Discharge Disposition Home   CM will evaluate for rehabilitation potential yes   Plan Pt will DC to home when medically ready with continued family support. Pt may need monitored for home O2 and HH services.   Patient/Family in Agreement with Plan yes   CM to Do MSW will continue to monitor pt for DC needs. MSW will have home oxygen  study ordered to see if pt needs home O2 upon DC.   PAST SERVICES   Have you had any in-home services in the past? No   Have you had Home Health Services in the past? No   Have you been to a Skilled Nursing Facility in the past? No   Discharge Needs Assessment  Concerns To Be Addressed denies needs/concerns at this time   Readmission Within the Last 30 Days unable to assess   Equipment Currently Used at Home cane, quad;shower chair;glucometer;pulse ox;other (see comments)  (bp cuff)   Equipment Needed After Discharge oxygen    Currently on Outpatient Dialysis? No   Discharge Facility/Level of Care Needs Home with DME (code 1);Home vs Home with Home Health   Transportation Available car;family or friend will provide   Referral Information   Admission Type inpatient   Arrived From home or self-care   Address Verified verified-no changes   Insurance Verified verified-no change   Source of Information Patient   Referral Source physician   Reason for Consult discharge planning   ADVANCE DIRECTIVES   Does the Patient have an Advance Directive? Yes, Patient Does Have Advance Directive for Healthcare Treatment   Type of Advance  Directive Completed Medical Power of Attorney   Copy of Advance Directives in Chart? No, Copy Requested From Patient   Name of MPOA or Healthcare Surrogate Sevrin Sally   Phone Number of MPOA or Healthcare Surrogate [phone]   Patient Requests Assistance in Having Advance Directive Notarized. 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  Yes        Pharmacy Name [facility] Location [place], [place]        Pharmacy Phone Number [phone]   Function Health MU group   Deaf or serious difficulty hearing? N   Blind or serious difficulty seeing even with glasses? N   Serious difficulty walking/climbing stairs? N   Difficulty dressing or bathing? N   Has difficulty doing errands because of physical, mental or emotional condition? N   Has serious difficulty concentrating, remembering or making decisions because of physical, mental or emotional condition ? N   Adaptive Devices Used: Cane;Glasses   Admission Notifications   Do you want your PCP notified of your admission? No   Do you want your family/representative notified of your admission? Family aware of admission   Living Environment   Lives With alone   Living Arrangements apartment  (Pt lives in a downstairs apartment.)   Current Living Arrangements apartment   Primary Care Provided by self   Family Caregiver if Needed sibling(s)   Family Caregiver Names Jerilynn Lauth   Quality of Family Relationships helpful;supportive   Able to Return to Prior Arrangements yes   Functional Status Prior   Ambulation 0 - independent   Transferring 0 - independent   Toileting 0 - independent   Bathing 0 - independent   Dressing 0 - independent   Eating 0 - independent   Communication 0 - understands/communicates without difficulty   Swallowing 0 - swallows foods/liquids  without difficulty   Prior Functional Level Comment Pt drives. Pt rarely uses a cane to ambulate.   Functional Status, IADL   Medications independent   Meal Preparation independent   Housekeeping independent   Laundry independent   Shopping independent   MEDICATION MANAGEMENT   Do you manage your own medications Yes   Do you use a pill box? Yes   Usage of Pill Box Pt fills his own pill box.   Emotional/Psychological   Affect affect consistent with mood   Emotion/Mood calm  Speech clear, coherent   Coping/Psychosocial Assessment   Observed Emotional State calm;cooperative;pleasant   Verbalized Emotional State acceptance       Discharge Plan:  Home vs home with Home Health, Home with DME (code 1)  Pt will DC to home when medically ready with continued family support. Pt is considering HH services but wants to make that decision closer to DC. Pt qualified for home O2 in the ER and may need to be monitored for home O2 prior to DC, if he does not DC to home within two days. Pt also may need to be monitored for SWG placement, if he is not medically ready for DC, due to him being a readmit.      The patient will continue to be evaluated for developing discharge needs.     Case Manager: Daphne Ruth, SOCIAL WORKER  Phone: [phone] ext. [phone]

## 2024-05-15 NOTE — Care Plan (Signed)
 [facility]  Rehabilitation Services  Occupational Therapy Initial Evaluation    Patient Name: Edwin [name]  Date of Birth: [birth year]  Height: Height: 172.7 cm (5' 8)  Weight: Weight: 95.4 kg (210 lb 4.8 oz)  Room/Bed: 52/A  Payor: [place] MEDICARE / Plan: [place] MEDICARE ADVANTAGE PPO / Product Type: PPO /     Assessment:   Edwin [name] is a 69 y/o male seen for OT eval this date. Pt is functioning at independent level not requiring skilled OT at this time. Recommend d/c home when medically stable.      Discharge Needs:   Equipment Recommendation: none anticipated      Discharge Disposition: home    Plan:   Current Intervention: other (see comments) (none)    To provide Occupational therapy services Evaluation Only, evaluation only.       The risks/benefits of therapy have been discussed with the patient/caregiver and he/she is in agreement with the established plan of care.       Subjective & Objective        05/15/24 1421   Therapist Pager   OT Assigned/ Phone # Norman Sharps   Rehab Session   Document Type evaluation   Total OT Minutes: 8   Patient Effort good   Symptoms Noted During/After Treatment none   General Information   Patient Profile Reviewed yes   Onset of Illness/Injury or Date of Surgery 05/13/24   Patient/Family/Caregiver Comments/Observations Pt reports nursing has been letting him get up by himself in the room.   Pertinent History of Current Functional Problem 69 y/o male admitted with hypoxic respiratory failure secondary to Flu A.   Respiratory Status nasal cannula  (2L)   Existing Precautions/Restrictions full code;droplet isolation   Pre Treatment Status   Pre Treatment Patient Status Patient sitting in bedside chair or w/c;Call light within reach;Telephone within reach;Nurse approved session   Support Present Pre Treatment  None   Communication Pre Treatment  Nurse   Mutuality/Individual Preferences   Individualized Care Needs up ad lib   Plan of Care Reviewed With patient    Living Environment   Lives With alone   Living Arrangements apartment   Home Accessibility bed and bath on same level   Functional Level Prior   Ambulation 0 - independent   Transferring 0 - independent   Toileting 0 - independent   Bathing 0 - independent   Dressing 0 - independent   Eating 0 - independent   Self-Care   Equipment Currently Used at Home no   Pain   Additional Documentation Pain Scale: Numbers Pre/Post-Treatment (Group)   Pain Assessment   Pre/Posttreatment Pain Comment none voiced   Coping/Psychosocial   Observed Emotional State calm;cooperative   Verbalized Emotional State acceptance   Cognition   Behavior/Mood Observations behavior appropriate to situation, WNL/WFL   Orientation Status oriented x 4   Attention WNL/WFL   Follows Commands WNL   RUE Assessment   RUE Assessment WFL- Within Functional Limits   LUE Assessment   LUE Assessment WFL- Within Functional Limits   Mobility Assessment/Training   Mobility Comment Pt completed functional amulation within room without AD IND.   Transfer Assessment/Treatment   Sit-Stand Independence independent   Stand-Sit Independence independent   Sit-Stand-Sit, Assist Device None   Bed-Chair Independence independent   Chair-Bed Independence independent   Bed-Chair-Bed Assist Device none   Toilet Transfer Independence independent   Toilet Transfer Assist Device none   Bathing Assessment/Intervention   Independence Level  independent   Upper Body Dressing Assessment/Training   Independence Level  independent   Lower Body Dressing Assessment/Training   Independence Level  independent   Toileting Assessment/Training   Independence Level  independent   Grooming/Oral Hygeine  Assessment/Training   Independence Level independent   Self-Feeding Assessment/Training   Independence Level independent   Balance   Sitting Balance: Static normal balance   Sitting Balance: Dynamic normal balance   Sit-to-Stand Balance good balance   Standing Balance: Static good balance    Standing Balance: Dynamic good balance   Post Treatment Status   Post Treatment Patient Status Patient sitting in bedside chair or w/c;Call light within reach;Telephone within reach   Support Present Post Treatment  None   Communication Post Treatement Nurse   Communication Post Treatment Comment no alarm needed   Planned Therapy Interventions, OT Eval   Planned Therapy Interventions other (see comments)  (none)   Functional Impairment   Overall Functional Impairments/Problem List other (see comments)  (none)   Clinical Impression   Functional Level at Time of Session Edwin [name] is a 69 y/o male seen for OT eval this date. Pt is functioning at independent level not requiring skilled OT at this time. Recommend d/c home when medically stable.   Criteria for Skilled Therapeutic Interventions Met (OT) no problems identified which require skilled intervention;does not meet criteria for skilled intervention   Therapy Frequency Evaluation Only   Predicted Duration of Therapy evaluation only   Anticipated Equipment Needs at Discharge none anticipated   Anticipated Discharge Disposition home   Evaluation Complexity Justification   Occupational Profile Review Brief history   Performance Deficits None   Clinical Decision Making Low analytic complexity   Evaluation Complexity Low       Therapist:   Norman Sharps, OT  05/15/2024 15:06

## 2024-05-16 ENCOUNTER — Inpatient Hospital Stay (HOSPITAL_COMMUNITY)

## 2024-05-16 LAB — CBC WITH DIFF
BASOPHIL #: <0.04 10*3/uL (ref <=0.20)
BASOPHIL %: 0.2 %
EOSINOPHIL #: 0.12 10*3/uL (ref <=0.50)
EOSINOPHIL %: 1.2 %
HCT: 36 % — ABNORMAL LOW (ref 38.9–52.0)
HGB: 12.3 g/dL — ABNORMAL LOW (ref 13.4–17.5)
IMMATURE GRANULOCYTE #: 0.04 10*3/uL (ref <0.10)
IMMATURE GRANULOCYTE %: 0.4 % (ref 0.0–1.0)
LYMPHOCYTE #: 1.52 10*3/uL (ref 1.00–4.80)
LYMPHOCYTE %: 15.2 %
MCH: 30 pg (ref 26.0–32.0)
MCHC: 34.2 g/dL (ref 31.0–35.5)
MCV: 87.8 fL (ref 78.0–100.0)
MONOCYTE #: 0.68 10*3/uL (ref 0.20–1.10)
MONOCYTE %: 6.8 %
MPV: 9.2 fL (ref 8.7–12.5)
NEUTROPHIL #: 7.61 10*3/uL (ref 1.50–7.70)
NEUTROPHIL %: 76.2 %
PLATELETS: 172 10*3/uL (ref 150–400)
RBC: 4.1 10*6/uL — ABNORMAL LOW (ref 4.50–6.10)
RDW-CV: 12.5 % (ref 11.5–15.5)
WBC: 10 10*3/uL (ref 3.7–11.0)

## 2024-05-16 LAB — POC BLOOD GLUCOSE (RESULTS)
GLUCOSE, POC: 154 mg/dL — ABNORMAL HIGH (ref 70–99)
GLUCOSE, POC: 173 mg/dL — ABNORMAL HIGH (ref 70–99)
GLUCOSE, POC: 186 mg/dL — ABNORMAL HIGH (ref 70–99)
GLUCOSE, POC: 150 mg/dL — ABNORMAL HIGH (ref 70–99)

## 2024-05-16 LAB — BASIC METABOLIC PANEL
ANION GAP: 9 mmol/L (ref 4–13)
BUN/CREA RATIO: 18 (ref 6–22)
BUN: 14 mg/dL (ref 8–25)
CALCIUM: 8.6 mg/dL (ref 8.6–10.3)
CHLORIDE: 100 mmol/L (ref 96–111)
CO2 TOTAL: 27 mmol/L (ref 23–31)
CREATININE: 0.8 mg/dL (ref 0.75–1.35)
GLUCOSE: 86 mg/dL (ref 65–125)
POTASSIUM: 4.8 mmol/L (ref 3.5–5.1)
SODIUM: 136 mmol/L (ref 136–145)
eGFRcr - MALE: >90 mL/min/{1.73_m2} (ref >=60)

## 2024-05-16 MED ORDER — ACETYLCYSTEINE 200 MG/ML (20 %) SOLUTION (RT USE CONFIG)
4.0000 mL | Freq: Three times a day (TID) | Status: AC
  Administered 2024-05-16 – 2024-05-19 (×10): 4 mL via RESPIRATORY_TRACT
  Filled 2024-05-16: qty 30

## 2024-05-16 NOTE — Progress Notes (Signed)
 Hospitalist Progress Note    Edwin [name]  Date of service: 05/16/24   Date of Admission:  05/13/2024  Hospital Day:  LOS: 2 days     Subjective/Interval Events:   69 y.o. male with past medical history of type II DM with insulin  pump, MI s/p PCI, cholecystectomy, HTN, and recent concern for CVA who presents with worsening cough, dyspnea, and hypoxia. He was admitted here 05/14/2024 after a recent admission [facility] for influenza A, then transferred to [facility] for possible CVA. TENECTEplase  was administered prior to transfer. MRI showed no evidence of CVA. Patient reports continued cough that produces green sputum, SOB, fever, and nausea. CTA chest is negative for PE and CXR shows no acute process. Patient remains hypoxic on RA and is requiring 2L NC to maintain O2 saturations >90%. Lab work-up is unremarkable. Patient will be admitted for further work-up and treatment of acute bronchitis with hypoxic respiratory failure likely secondary to Flu A.  Patient is still requiring oxygen  today.  Clinically not feeling any better endorsing a productive cough.  Could not tolerate steroids due to previous history of panic attacks. Started on Mucomyst . Labs and chest x-ray are stable.  We will continue current medical treatment.   Vital Signs:  Temp (24hrs) Max:36.7 C (98 F)      Systolic (24hrs), Avg:106 , Min:102 , Max:110     Diastolic (24hrs), Avg:68, Min:55, Max:76    Temp  Avg: 36.6 C (97.9 F)  Min: 36.5 C (97.7 F)  Max: 36.7 C (98 F)  MAP (Non-Invasive)  Avg: 79 mmHG  Min: 76 mmHG  Max: 82 mmHG  Pulse  Avg: 75.5  Min: 67  Max: 88  Resp  Avg: 17  Min: 17  Max: 17  SpO2  Avg: 96 %  Min: 95 %  Max: 97 %       I/O:  I/O last 24 hours:    Intake/Output Summary (Last 24 hours) at 05/16/2024 1040  Last data filed at 05/16/2024 0844  Gross per 24 hour   Intake 1940 ml   Output --   Net 1940 ml     I/O current shift:  01/29 0700 - 01/29 1859  In: 500 [P.O.:480]  Out: -     Current Medications:  acetaminophen  (TYLENOL ) tablet,  650 mg, Oral, Q4H PRN  acetylcysteine  (MUCOMYST ) 20% nebulized solution, 4 mL, Nebulization, 3x/day  aluminum -magnesium  hydroxide-simethicone  (MAG-AL PLUS) 200-200-20 mg per 5 mL oral liquid, 30 mL, Oral, Q4H PRN  aspirin  (ECOTRIN) enteric coated tablet 81 mg, 81 mg, Oral, Daily  atorvastatin  (LIPITOR) tablet, 40 mg, Oral, QPM  azithromycin  (ZITHROMAX ) tablet, 500 mg, Oral, Daily  benzonatate  (TESSALON ) capsule, 100 mg, Oral, Q8H PRN  budesonide  (PULMICORT  RESPULES) 0.5 mg/2 mL nebulizer suspension, 1 mg, Nebulization, 2x/day  cefTRIAXone  (ROCEPHIN ) 2 g in NS 20 mL IV injection, 2 g, Intravenous, Q24H  clopidogrel  (PLAVIX ) 75 mg tablet, 75 mg, Oral, Daily  D10W bolus infusion 250 mL, 250 mL, Intravenous, Q1H PRN  dapagliflozin  (FARXIGA ) tablet, 10 mg, Oral, Daily  dextrose  (GLUTOSE) 40% oral gel, 15 g, Oral, Q15 Min PRN  ezetimibe  (ZETIA ) tablet, 10 mg, Oral, QPM  folic acid  (FOLVITE ) tablet, 1 mg, Oral, Daily  furosemide  (LASIX ) tablet, 20 mg, Oral, Daily  glucagon  injection 1 mg, 1 mg, IntraMUSCULAR, Once PRN  guaiFENesin  100mg  per 5mL oral liquid - for cough (expectorant), 400 mg, Oral, Q6H PRN  icosapent  ethyl (VASCEPA ) capsule, 2 g, Oral, 2x/day  Insulin  Medication Pump - Patient's own, 1  Each, Subcutaneous, Continuous  ipratropium (ATROVENT ) 0.02% nebulizer solution, 0.5 mg, Nebulization, Q4H PRN  isosorbide  mononitrate (IMDUR ) 24 hr extended release tablet, 30 mg, Oral, QAM  lactobacillus rhamnosus (CULTURELLE) active cultures capsule, 1 Capsule, Oral, 2x/day-Food  levalbuterol  (XOPENEX ) 0.63 mg/ 3 mL nebulizer solution, 0.63 mg, Nebulization, Q4H PRN  levalbuterol  (XOPENEX ) 0.63 mg/ 3 mL nebulizer solution, 0.63 mg, Nebulization, 4x/day  melatonin tablet, 3 mg, Oral, HS PRN  metoprolol  tartrate (LOPRESSOR ) tablet, 100 mg, Oral, 2x/day  NS 250 mL flush bag, , Intravenous, Q15 Min PRN  NS flush syringe, 10 mL, Intracatheter, Q8HRS  NS flush syringe, 10 mL, Intracatheter, Q1H PRN  ondansetron  (ZOFRAN ) 2 mg/mL  injection, 4 mg, Intravenous, Q6H PRN  pantoprazole  (PROTONIX ) delayed release tablet, 40 mg, Oral, Daily  ranolazine  (RANEXA ) extended release tablet, 500 mg, Oral, 2x/day  rOPINIRole  (REQUIP ) tablet, 0.5 mg, Oral, 3x/day  sertraline  (ZOLOFT ) tablet, 50 mg, Oral, Daily            ROS:   All systems reviewed and negative except for what is noted above in the HPI       Examination:  Gen:  69 y.o. White male who appears in no apparent distress  HENT:  mmm  CV:  regular rate and rhythm   Resp:  clear, no wheezing or rhonchi with cough  Abd:  soft, non tender, bowel sounds present  Ext:  no edema, distal pulses in tact  Neuro:  non focal, alert and oriented, moves all 4   Pscyh:  normal mood and affect       Labs:    CBC (Last 24 Hours):    Recent Results last 24 hours     05/16/24  0634   WBC 10.0   HGB 12.3*   HCT 36.0*   MCV 87.8   PLTCNT 172       Last BMP  (Last result in the past 24 hours)        Na   K   Cl   CO2   BUN   Cr   Calcium   Glucose   Glucose-Fasting        05/16/24 0634 136   4.8   100   27   14   0.80   8.6  Comment: Gadolinium-containing contrast can interfere with calcium measurement.     86               No results for input(s): MAGNESIUM , PHOSPHORUS in the last 24 hours.  No results for input(s): INR, APTT in the last 24 hours.  TROPONIN I   Date Value Ref Range Status   12/13/2016 <=0.01 <=0.03 ng/mL Final         Imaging:    Results for orders placed or performed during the hospital encounter of 05/13/24 (from the past 24 hours)   XR CHEST AP     Status: None    Narrative    Edwin [name]      CLINICAL: , Cough, Hypoxia     COMPARISON: 05/15/2024      FINDINGS:   The cardiac silhouette is within normal limits. The hilar and mediastinal structures are unremarkable.  The pulmonary vasculature is within normal limits.  Chronic elevation of the right diaphragm with a small chronic right lower lobe atelectasis are noted. The remainder of the lung fields is unremarkable. No evidence of  pleural effusion is seen.   The visualized bony thorax is unremarkable.      Impression  1. No acute parenchymal process, showing no change from 1/28.   2. Chronic elevation of the right diaphragm and associated chronic right basilar atelectasis are noted.      Radiologist location ID: [facility]           Microbiology:  Hospital Encounter on 05/13/24 (from the past 96 hours)   ADULT ROUTINE BLOOD CULTURE, SET OF 2 ADULT BOTTLES (BACTERIA AND YEAST)    Collection Time: 05/13/24  4:52 PM    Specimen: Blood   Culture Result Status    BLOOD CULTURE, ROUTINE No Growth 2 Days Preliminary             Assessment/ Plan:     Active Hospital Problems    Diagnosis    Primary Problem: Hypoxia    Acute bronchitis    Insulin  pump in place    Influenza A       Acute Bronchitis Hypoxic Respiratory Failure  - Treat with empiric Rocephin , Azithromycin , Solu-Medrol , nebs, and Tessalon . O2 2L; wean as tolerated. Follow daily CXR.  Patient endorses continued symptoms of cough and congestion.  Still short of breath with any activity.  Still requiring oxygen .  Still showing signs of rhonchi and wheezes.  Unable to tolerate steroids due to previous history of panic attacks     Influenza A  - Tested positive on 05/04/24. Completed course of Tamiflu .  Continue current medical treatment plan. Try to wean oxygen      Chronic medical conditions:   CAD: S/p PCI, no reported CP or typical angina symptoms. Cardiac enzymes negative in ED. Resume home medical management for CAD and associated risk factors.   H/o CVA: Recent concern for CVA vs TIA; received TENECTEplase  and transferred to [facility]. No evidence of CVA on MRI. Resume DAPT and Statin.   HTN: Monitor scheduled vitals. Resume home antihypertensive medications.   HLD: Continue Statin.   IDDM type II: On Ozempic and administrated q Thursday which will be held during admission. Resume SGLT-2 inhibitor. On insulin  pump, monitor FSBS closely.  Sugars remain under good control  GERD: On PPI    Mixed anxiety/depression: Continue Zoloft  daily  RLS: On Requip  TID      DVT/PE Prophylaxis: Plavix  and aspirin           Prentice Jill Balling, APRN, CNP1/29/202610:40  [facility] - HOSPITALIST

## 2024-05-16 NOTE — Nurses Notes (Signed)
 Patient c/o increased chest congestion this shift. Dr. Sebastian made aware and verbal order received for Mucomyst  3x day.

## 2024-05-16 NOTE — Care Plan (Signed)
 Shift Summary  Acetaminophen  and melatonin were administered PRN during the shift.  SpO2 remained at 97% on nasal cannula and MEWS scores were stable at 1 throughout the shift.  Pain was consistently rated at 0 and no anxieties or concerns were reported.  Patient was able to reposition independently and participated in safety measures.  Overall, comfort and respiratory status were maintained during the shift.    Optimal Comfort and Wellbeing: Pain remained at 0 throughout the shift and acetaminophen  was given PRN; melatonin was also administered for insomnia, and no anxieties or concerns were documented.    Optimal Gas Exchange: SpO2 was 97% on nasal cannula, breath sounds were diminished bilaterally, and MEWS scores remained at 1 throughout the shift.    Goal Outcome Evaluation:     Anxieties, Fears or Concerns: none (05/15/24 1910)  Individualized Care Needs: up ab lib (05/15/24 1910)  Patient-Specific Goals (Include Timeframe): to get better (05/15/24 1910)  Plan of Care Reviewed With: patient (05/15/24 1910)     Patient Progress: no change

## 2024-05-16 NOTE — Care Plan (Signed)
 Shift Summary  Nausea was relieved after ondansetron  administration and pain remained at 0 throughout the shift.  Patient c/o congestion. Mucomyst  ordered. Patient reports this has helped.   Fall prevention measures were maintained, including safety rounds and activity supervision, despite repeated refusal of bed alarm.  Patient was able to reposition and ambulate independently, with no falls or injuries reported during the shift.  Blood glucose levels were elevated and RN was notified as per protocol.  Overall, comfort and safety goals were met and no significant adverse events occurred during the shift.    Optimal Comfort and Wellbeing: Pain remained at 0 throughout the shift and nausea was relieved after ondansetron  administration; sleep and emotional state were stable and no comfort issues were identified.    Absence of Fall and Fall-Related Injury: No falls occurred and fall prevention interventions were maintained, with the patient able to reposition independently and ambulate without assistance; bed alarm was refused multiple times but safety rounds and precautions were consistently completed.    Goal Outcome Evaluation:     Anxieties, Fears or Concerns: (not recorded)  Individualized Care Needs: (not recorded)  Patient-Specific Goals (Include Timeframe): (not recorded)  Plan of Care Reviewed With: (not recorded)     Patient Progress: no change

## 2024-05-17 ENCOUNTER — Inpatient Hospital Stay (HOSPITAL_COMMUNITY)

## 2024-05-17 ENCOUNTER — Other Ambulatory Visit: Payer: Self-pay

## 2024-05-17 LAB — CBC WITH DIFF
BASOPHIL #: <0.04 10*3/uL (ref <=0.20)
BASOPHIL %: 0.2 %
EOSINOPHIL #: 0.09 10*3/uL (ref <=0.50)
EOSINOPHIL %: 1.1 %
HCT: 35.8 % — ABNORMAL LOW (ref 38.9–52.0)
HGB: 12.4 g/dL — ABNORMAL LOW (ref 13.4–17.5)
IMMATURE GRANULOCYTE #: 0.04 10*3/uL (ref <0.10)
IMMATURE GRANULOCYTE %: 0.5 % (ref 0.0–1.0)
LYMPHOCYTE #: 1.88 10*3/uL (ref 1.00–4.80)
LYMPHOCYTE %: 22.5 %
MCH: 30.2 pg (ref 26.0–32.0)
MCHC: 34.6 g/dL (ref 31.0–35.5)
MCV: 87.1 fL (ref 78.0–100.0)
MONOCYTE #: 0.58 10*3/uL (ref 0.20–1.10)
MONOCYTE %: 6.9 %
MPV: 9.6 fL (ref 8.7–12.5)
NEUTROPHIL #: 5.75 10*3/uL (ref 1.50–7.70)
NEUTROPHIL %: 68.8 %
PLATELETS: 198 10*3/uL (ref 150–400)
RBC: 4.11 10*6/uL — ABNORMAL LOW (ref 4.50–6.10)
RDW-CV: 12.2 % (ref 11.5–15.5)
WBC: 8.4 10*3/uL (ref 3.7–11.0)

## 2024-05-17 LAB — POC BLOOD GLUCOSE (RESULTS)
GLUCOSE, POC: 103 mg/dL — ABNORMAL HIGH (ref 70–99)
GLUCOSE, POC: 172 mg/dL — ABNORMAL HIGH (ref 70–99)
GLUCOSE, POC: 177 mg/dL — ABNORMAL HIGH (ref 70–99)
GLUCOSE, POC: 167 mg/dL — ABNORMAL HIGH (ref 70–99)

## 2024-05-17 LAB — BASIC METABOLIC PANEL
ANION GAP: 8 mmol/L (ref 4–13)
BUN/CREA RATIO: 19 (ref 6–22)
BUN: 15 mg/dL (ref 8–25)
CALCIUM: 8.4 mg/dL — ABNORMAL LOW (ref 8.6–10.3)
CHLORIDE: 101 mmol/L (ref 96–111)
CO2 TOTAL: 28 mmol/L (ref 23–31)
CREATININE: 0.8 mg/dL (ref 0.75–1.35)
GLUCOSE: 109 mg/dL (ref 65–125)
POTASSIUM: 4.3 mmol/L (ref 3.5–5.1)
SODIUM: 137 mmol/L (ref 136–145)
eGFRcr - MALE: >90 mL/min/{1.73_m2} (ref >=60)

## 2024-05-17 MED ORDER — POLYETHYLENE GLYCOL 3350 17 GRAM ORAL POWDER PACKET
17.0000 g | Freq: Every day | ORAL | Status: DC | PRN
  Administered 2024-05-18 – 2024-05-19 (×2): 17 g via ORAL
  Filled 2024-05-17 (×2): qty 1

## 2024-05-17 MED ORDER — METOPROLOL TARTRATE 50 MG TABLET
50.0000 mg | ORAL_TABLET | Freq: Two times a day (BID) | ORAL | Status: DC
  Administered 2024-05-17: 0 mg via ORAL
  Administered 2024-05-18 – 2024-05-19 (×3): 50 mg via ORAL
  Filled 2024-05-17 (×6): qty 1

## 2024-05-17 MED ORDER — SENNOSIDES 8.6 MG-DOCUSATE SODIUM 50 MG TABLET
1.0000 | ORAL_TABLET | Freq: Two times a day (BID) | ORAL | Status: DC
  Administered 2024-05-17 – 2024-05-19 (×5): 1 via ORAL
  Filled 2024-05-17 (×7): qty 1

## 2024-05-17 NOTE — Care Management Notes (Signed)
 [facility]  Care Management Note    Patient Name: Edwin [name]  Date of Birth: 1955/06/30  Sex: male  Date/Time of Admission: 05/13/2024  3:20 PM  Room/Bed: 50/A  Payor: [place] MEDICARE / Plan: [place] MEDICARE ADVANTAGE PPO / Product Type: PPO /    LOS: 3 days   Primary Care Providers:  Froylan Jester, MD, MD (General)    Admitting Diagnosis:  Hypoxia [R09.02]    Assessment:      05/17/24 1600   Assessment Detail   Assessment Type Continued Assessment   Date of Care Management Update 05/17/24   Medicare Intent to Discharge Documentation   Discharge IMM give to: Patient   Discharge IMM Letter Given Date 05/17/24   Discharge IMM Letter Given Time 1558   IMM explained/reviewed with:  Patient;verbalized understanding   Social Work Plan   Discharge Planning Status plan in progress   Anticipated Discharge Disposition Home   CM will evaluate for rehabilitation potential no   Form for patient choice reviewed/signed and on chart no   Plan Pt may DC to home over the weekend, if he is able to be weaned from O2. Pt denies any DC needs.   Patient/Family in Agreement with Plan yes   CM to Do MSW completed DC paperwork in case pt is able to DC to home.         Discharge Plan:  Home (Patient/Family Member/other) (code 1)  Pt may DC to home over the weekend, if he is able to be weaned from O2. Pt denies any DC needs. MSW explained IMM with pt; he verbalized understanding, did not have any questions and signed the form.     The patient will continue to be evaluated for developing discharge needs.     Case Manager: Daphne Ruth, SOCIAL WORKER  Phone: [phone] ext. [phone]

## 2024-05-17 NOTE — Care Plan (Signed)
 Shift Summary  Acetaminophen  and melatonin were administered PRN during the shift.   Hourly safety rounds were completed and universal fall precautions were maintained.   No falls or injuries occurred and patient was able to reposition independently.   Overall, comfort was maintained and safety was promoted throughout the shift.     Optimal Comfort and Wellbeing: Pain was rated at 0 at the start of the shift and acetaminophen  was given PRN; no pain complaints documented after administration and patient was able to reposition independently throughout the shift. Melatonin was also administered PRN for insomnia.     Absence of Fall and Fall-Related Injury: Hourly safety rounds were consistently completed, universal fall precautions were in place, and no falls or injuries were documented during the shift.     Goal Outcome Evaluation:     Anxieties, Fears or Concerns: (not recorded)  Individualized Care Needs: (not recorded)  Patient-Specific Goals (Include Timeframe): (not recorded)  Plan of Care Reviewed With: (not recorded)     Patient Progress: no change

## 2024-05-17 NOTE — Care Plan (Signed)
 Shift Summary  Acetaminophen  was given for headache, resulting in a significant reduction in pain score.   Blood pressure remained low during the shift.   Overall, comfort improved after intervention and gas exchange was supported with oxygen  therapy.     Optimal Comfort and Wellbeing: Pain in the head increased sharply mid-morning but decreased to a low level after acetaminophen  administration; mood and affect remained calm and appropriate throughout the shift, and patient reported feeling good aside from congestion and cough.     Optimal Gas Exchange: Oxygen  saturation fluctuated between 87% and 99% depending on activity and oxygen  delivery, with 2L nasal cannula support maintained; breath sounds remained diminished and blood pressure stayed low throughout the shift.     Goal Outcome Evaluation:     Anxieties, Fears or Concerns: none (05/17/24 1800)  Individualized Care Needs: up ad lib (05/17/24 1800)  Patient-Specific Goals (Include Timeframe): to go home soon (05/17/24 1800)  Plan of Care Reviewed With: patient (05/17/24 1800)     Patient Progress: improving

## 2024-05-17 NOTE — Care Plan (Signed)
 [facility]  [address]  [place], [address]      Physical Therapy Daily Inpatient Note    Date: 05/17/2024  Patient's Name: Edwin [name]  Date of Birth: May 16, 1955  Height: Height: 172.7 cm (5' 8)  Weight: Weight: 95.4 kg (210 lb 4.8 oz)          Subjective See Flow Sheet    Objective  Flowsheet    05/17/24 1104   Therapist Pager   PT Assigned/ Phone # Lani Spire PTA   Rehab Session   Total PT Minutes: 12   Patient Effort good   General Information   Patient/Family/Caregiver Comments/Observations Pt states other than the congestion and cough he feels good.   Mutuality/Individual Preferences   Individualized Care Needs Up Ad Lib   Pre Treatment Status   Pre Treatment Patient Status Patient supine in bed;Call light within reach   Support Present Pre Treatment  None   Cognition   Behavior/Mood Observations behavior appropriate to situation, WNL/WFL   Bed Mobility   Supine-Sit Independence independent   Sit to Supine, Independence independent   Transfer Assessment/Treatment   Sit-Stand Independence independent   Stand-Sit Independence independent   Sit-Stand-Sit, Assist Device None   Bed-Chair Independence independent   Chair-Bed Independence independent   Bed-Chair-Bed Assist Device none   Toilet Transfer Independence independent   Toilet Transfer Assist Device none   Gait Assessment/Treatment   Total Distance Ambulated 100   Independence  supervision required   Distance in Feet 2x50   Gait Speed community ambulator   Impairments  endurance   Therapeutic Exercise/Activity   Comment Pt performed 2x10 STS from chair to improve B LE functional strength.   Post Treatment Status   Post Treatment Patient Status Patient supine in bed;Call light within reach   Support Present Post Treatment  None   Physical Therapy Clinical Impression   Assessment Therapy toleration was well today. He reported mild fatigue while training, but was able to complete session w/ no rest  breaks. Pt is able to D/C home when medically stable.   Rehab Potential fair         Assessment:  (P) Therapy toleration was well today. He reported mild fatigue while training, but was able to complete session w/ no rest breaks. Pt is able to D/C home when medically stable.    Plan: Will continue under current POC.             Intervention minutes: THERAPEUTIC ACTIVITY 12 minutes    THERAPIST  Ozell ONEIDA Spire, PTA  05/17/2024, 13:45

## 2024-05-18 LAB — BASIC METABOLIC PANEL
ANION GAP: 7 mmol/L (ref 4–13)
BUN/CREA RATIO: 20 (ref 6–22)
BUN: 14 mg/dL (ref 8–25)
CALCIUM: 8.5 mg/dL — ABNORMAL LOW (ref 8.6–10.3)
CHLORIDE: 104 mmol/L (ref 96–111)
CO2 TOTAL: 30 mmol/L (ref 23–31)
CREATININE: 0.7 mg/dL — ABNORMAL LOW (ref 0.75–1.35)
GLUCOSE: 100 mg/dL (ref 65–125)
POTASSIUM: 4.5 mmol/L (ref 3.5–5.1)
SODIUM: 141 mmol/L (ref 136–145)
eGFRcr - MALE: >90 mL/min/{1.73_m2} (ref >=60)

## 2024-05-18 LAB — POC BLOOD GLUCOSE (RESULTS)
GLUCOSE, POC: 93 mg/dL (ref 70–99)
GLUCOSE, POC: 113 mg/dL — ABNORMAL HIGH (ref 70–99)
GLUCOSE, POC: 126 mg/dL — ABNORMAL HIGH (ref 70–99)
GLUCOSE, POC: 150 mg/dL — ABNORMAL HIGH (ref 70–99)

## 2024-05-18 LAB — CBC WITH DIFF
BASOPHIL #: <0.04 10*3/uL (ref <=0.20)
BASOPHIL %: 0.3 %
EOSINOPHIL #: 0.1 10*3/uL (ref <=0.50)
EOSINOPHIL %: 1.4 %
HCT: 36.4 % — ABNORMAL LOW (ref 38.9–52.0)
HGB: 12.5 g/dL — ABNORMAL LOW (ref 13.4–17.5)
IMMATURE GRANULOCYTE #: 0.04 10*3/uL (ref <0.10)
IMMATURE GRANULOCYTE %: 0.6 % (ref 0.0–1.0)
LYMPHOCYTE #: 1.83 10*3/uL (ref 1.00–4.80)
LYMPHOCYTE %: 26.4 %
MCH: 30 pg (ref 26.0–32.0)
MCHC: 34.3 g/dL (ref 31.0–35.5)
MCV: 87.3 fL (ref 78.0–100.0)
MONOCYTE #: 0.5 10*3/uL (ref 0.20–1.10)
MONOCYTE %: 7.2 %
MPV: 9.2 fL (ref 8.7–12.5)
NEUTROPHIL #: 4.43 10*3/uL (ref 1.50–7.70)
NEUTROPHIL %: 64.1 %
PLATELETS: 198 10*3/uL (ref 150–400)
RBC: 4.17 10*6/uL — ABNORMAL LOW (ref 4.50–6.10)
RDW-CV: 12.3 % (ref 11.5–15.5)
WBC: 6.9 10*3/uL (ref 3.7–11.0)

## 2024-05-18 LAB — ADULT ROUTINE BLOOD CULTURE, SET OF 2 BOTTLES (BACTERIA AND YEAST): BLOOD CULTURE, ROUTINE: NO GROWTH

## 2024-05-18 NOTE — Nurses Notes (Addendum)
 BP 104/65 HR 70s. Dr. Thersia made aware and verbal order received to hold PO Imdur  and Lasix  this AM and to give scheduled Metoprolol  dose. No further orders received. Patient decreased to 1LNC, O2 95%, will recheck.

## 2024-05-18 NOTE — Care Plan (Signed)
 Shift Summary  Fall risk assessment completed and universal fall precautions maintained, with patient remaining independent in mobility and no falls reported during the shift.  Acetaminophen  and melatonin were administered PRN without incident.  Blood glucose was elevated at 167 mg/dl and RN was notified as per protocol.  Hourly safety rounds were consistently performed, and patient was observed sleeping during several checks.  Patient remained free of falls or injuries and was stable with all safety interventions in place during the shift.    Absence of Fall and Fall-Related Injury: Universal fall precautions were maintained throughout the shift, with hourly safety rounds completed and no falls or injuries documented; patient refused bed alarm but other safety measures were in place. Patient was on two or more high fall risk medications, but remained independent with mobility and had no unsteady gait or recent fall history.    Goal Outcome Evaluation:     Anxieties, Fears or Concerns: (not recorded)  Individualized Care Needs: (not recorded)  Patient-Specific Goals (Include Timeframe): (not recorded)  Plan of Care Reviewed With: (not recorded)     Patient Progress: no change

## 2024-05-18 NOTE — Progress Notes (Signed)
 Hospitalist Progress Note    Edwin [name]  Date of service: 05/18/24   Date of Admission:  05/13/2024  Hospital Day:  LOS: 4 days     Subjective/Interval Events: 69 y.o. male with past medical history of type II DM with insulin  pump, MI s/p PCI, cholecystectomy, HTN, and recent concern for CVA who presents with worsening cough, dyspnea, and hypoxia. He was admitted 05/14/2024 after a recent admission [facility] for influenza A, then transferred to [facility] for possible CVA. TENECTEplase  was administered prior to transfer. MRI showed no evidence of CVA. Patient reports continued cough that produces green sputum, SOB, fever, and nausea. CTA chest is negative for PE and CXR shows no acute process. Patient remains hypoxic on RA and is requiring 2L NC to maintain O2 saturations >90%. Lab work-up is unremarkable. Patient monitored for hypoxic respiratory failure likely secondary to Flu A.  Patient is still requiring oxygen  today but is now down to 1 L supplemental oxygen .  Reports his cough and shortness of breath have improved with the use of Mucomyst .  Labs and chest x-ray are stable.  We will continue working to wean to room air.    Vital Signs:  Temp (24hrs) Max:36.4 C (97.5 F)      Systolic (24hrs), Avg:104 , Min:91 , Max:117     Diastolic (24hrs), Avg:56, Min:46, Max:65    Temp  Avg: 36.4 C (97.5 F)  Min: 36.4 C (97.5 F)  Max: 36.4 C (97.5 F)  MAP (Non-Invasive)  Avg: 75 mmHG  Min: 73 mmHG  Max: 77 mmHG  Pulse  Avg: 80.3  Min: 73  Max: 90  Resp  Avg: 17.5  Min: 17  Max: 18  SpO2  Avg: 95 %  Min: 93 %  Max: 96 %       I/O:  I/O last 24 hours:    Intake/Output Summary (Last 24 hours) at 05/18/2024 1401  Last data filed at 05/18/2024 1248  Gross per 24 hour   Intake 1820 ml   Output --   Net 1820 ml     I/O current shift:  01/31 0700 - 01/31 1859  In: 860 [P.O.:840]  Out: -     Current Medications:  acetaminophen  (TYLENOL ) tablet, 650 mg, Oral, Q4H PRN  acetylcysteine  (MUCOMYST ) 20% nebulized solution, 4 mL,  Nebulization, 3x/day  aluminum -magnesium  hydroxide-simethicone  (MAG-AL PLUS) 200-200-20 mg per 5 mL oral liquid, 30 mL, Oral, Q4H PRN  aspirin  (ECOTRIN) enteric coated tablet 81 mg, 81 mg, Oral, Daily  atorvastatin  (LIPITOR) tablet, 40 mg, Oral, QPM  azithromycin  (ZITHROMAX ) tablet, 500 mg, Oral, Daily  benzonatate  (TESSALON ) capsule, 100 mg, Oral, Q8H PRN  budesonide  (PULMICORT  RESPULES) 0.5 mg/2 mL nebulizer suspension, 1 mg, Nebulization, 2x/day  cefTRIAXone  (ROCEPHIN ) 2 g in NS 20 mL IV injection, 2 g, Intravenous, Q24H  clopidogrel  (PLAVIX ) 75 mg tablet, 75 mg, Oral, Daily  D10W bolus infusion 250 mL, 250 mL, Intravenous, Q1H PRN  dapagliflozin  (FARXIGA ) tablet, 10 mg, Oral, Daily  dextrose  (GLUTOSE) 40% oral gel, 15 g, Oral, Q15 Min PRN  ezetimibe  (ZETIA ) tablet, 10 mg, Oral, QPM  folic acid  (FOLVITE ) tablet, 1 mg, Oral, Daily  furosemide  (LASIX ) tablet, 20 mg, Oral, Daily  glucagon  injection 1 mg, 1 mg, IntraMUSCULAR, Once PRN  guaiFENesin  100mg  per 5mL oral liquid - for cough (expectorant), 400 mg, Oral, Q6H PRN  icosapent  ethyl (VASCEPA ) capsule, 2 g, Oral, 2x/day  Insulin  Medication Pump - Patient's own, 1 Each, Subcutaneous, Continuous  ipratropium (ATROVENT ) 0.02% nebulizer solution,  0.5 mg, Nebulization, Q4H PRN  isosorbide  mononitrate (IMDUR ) 24 hr extended release tablet, 30 mg, Oral, QAM  lactobacillus rhamnosus (CULTURELLE) active cultures capsule, 1 Capsule, Oral, 2x/day-Food  levalbuterol  (XOPENEX ) 0.63 mg/ 3 mL nebulizer solution, 0.63 mg, Nebulization, Q4H PRN  levalbuterol  (XOPENEX ) 0.63 mg/ 3 mL nebulizer solution, 0.63 mg, Nebulization, 4x/day  melatonin tablet, 3 mg, Oral, HS PRN  metoprolol  tartrate (LOPRESSOR ) tablet, 50 mg, Oral, 2x/day  NS 250 mL flush bag, , Intravenous, Q15 Min PRN  NS flush syringe, 10 mL, Intracatheter, Q8HRS  NS flush syringe, 10 mL, Intracatheter, Q1H PRN  ondansetron  (ZOFRAN ) 2 mg/mL injection, 4 mg, Intravenous, Q6H PRN  pantoprazole  (PROTONIX ) delayed release  tablet, 40 mg, Oral, Daily  polyethylene glycol (MIRALAX ) oral packet, 17 g, Oral, Daily PRN  ranolazine  (RANEXA ) extended release tablet, 500 mg, Oral, 2x/day  rOPINIRole  (REQUIP ) tablet, 0.5 mg, Oral, 3x/day  sennosides-docusate sodium  (SENOKOT-S) 8.6-50mg  per tablet, 1 Tablet, Oral, 2x/day  sertraline  (ZOLOFT ) tablet, 50 mg, Oral, Daily            ROS:   All systems reviewed and negative except for what is noted above in the HPI       Examination:  Gen:  69 y.o. White male in no apparent distress.  HENT:  mmm  CV:  regular rate and rhythm   Resp:  rhonchi throughout with deep inspiration, with occasional cough  Abd:  soft, non tender, bowel sounds present  Ext:  no edema, distal pulses in tact  Neuro:  non focal, alert and oriented, moves all 4   Pscyh:  normal mood and affect       Labs:    CBC (Last 24 Hours):    Recent Results last 24 hours     05/18/24  0616   WBC 6.9   HGB 12.5*   HCT 36.4*   MCV 87.3   PLTCNT 198       Last BMP  (Last result in the past 24 hours)        Na   K   Cl   CO2   BUN   Cr   Calcium   Glucose   Glucose-Fasting        05/18/24 0615 141   4.5   104   30   14   0.70   8.5  Comment: Gadolinium-containing contrast can interfere with calcium measurement.     100               No results for input(s): MAGNESIUM , PHOSPHORUS in the last 24 hours.  No results for input(s): INR, APTT in the last 24 hours.  TROPONIN I   Date Value Ref Range Status   12/13/2016 <=0.01 <=0.03 ng/mL Final         Imaging:             Microbiology:  No results found for any visits on 05/13/24 (from the past 96 hours).            Assessment/ Plan:     Active Hospital Problems    Diagnosis    Primary Problem: Hypoxia    Acute bronchitis    Insulin  pump in place    Influenza A       Acute Bronchitis Hypoxic Respiratory Failure  - Treat with empiric Rocephin , Azithromycin , Solu-Medrol , nebs, and Tessalon . O2 2L; wean as tolerated. Follow daily CXR.  Patient endorses continued symptoms of cough and congestion  but improving.  Still short of breath  with any activity.  Still requiring oxygen  but down to 1L NC. Still showing signs of rhonchi and wheezes.  Unable to tolerate steroids due to previous history of panic attacks     Influenza A  - Tested positive on 05/04/24. Completed course of Tamiflu .  Continue current medical treatment plan. Continue to wean oxygen .     Chronic medical conditions:   CAD: S/p PCI, no reported CP or typical angina symptoms. Cardiac enzymes negative in ED. Resume home medical management for CAD and associated risk factors.   H/o CVA: Recent concern for CVA vs TIA; received TENECTEplase  and transferred to [facility] No evidence of CVA on MRI. Resume DAPT and Statin.   HTN: Monitor scheduled vitals. Resume home antihypertensive medications.   HLD: Continue Statin.   IDDM type II: On Ozempic and administrated q Thursday which will be held during admission. Resume SGLT-2 inhibitor. On insulin  pump, monitor FSBS closely.  Sugars remain under good control.  GERD: On PPI   Mixed anxiety/depression: Continue Zoloft  daily  RLS: On Requip  TID      DVT/PE Prophylaxis: Plavix  and aspirin       Verdon Karma, DO  Hospitalist, [facility]

## 2024-05-18 NOTE — Care Plan (Signed)
 Shift Summary  Aerosol treatments and incentive spirometer were self-administered twice during the shift to support respiratory function.   Oxygen  therapy was maintained at 1L via nasal cannula, with SpO2 trending slightly downward.   Metoprolol  tartrate was administered twice for blood pressure management.   Blood glucose was checked and insulin  administered for elevated levels in the afternoon.   Patient remained pain-free and independent in mobility, with fall prevention protocols maintained throughout the shift.    Optimal Comfort and Wellbeing: Pain remained at 0 throughout the shift and no sleep or emotional distress was documented; patient was able to reposition independently and required only tray set-up for meals.    Optimal Gas Exchange: SpO2 decreased slightly from 95% to 93% over the shift while on 1L nasal cannula; aerosol treatments and incentive spirometer were self-administered, and breath sounds remained diminished.    Absence of Fall and Fall-Related Injury: Fall risk interventions and safety measures were maintained, patient was able to ambulate independently, and no falls occurred during the shift.    Goal Outcome Evaluation:     Anxieties, Fears or Concerns: (not recorded)  Individualized Care Needs: (not recorded)  Patient-Specific Goals (Include Timeframe): (not recorded)  Plan of Care Reviewed With: (not recorded)     Patient Progress: improving

## 2024-05-19 LAB — BASIC METABOLIC PANEL
ANION GAP: 9 mmol/L (ref 4–13)
BUN/CREA RATIO: 23 — ABNORMAL HIGH (ref 6–22)
BUN: 16 mg/dL (ref 8–25)
CALCIUM: 8.9 mg/dL (ref 8.6–10.3)
CHLORIDE: 104 mmol/L (ref 96–111)
CO2 TOTAL: 28 mmol/L (ref 23–31)
CREATININE: 0.7 mg/dL — ABNORMAL LOW (ref 0.75–1.35)
GLUCOSE: 107 mg/dL (ref 65–125)
POTASSIUM: 4.6 mmol/L (ref 3.5–5.1)
SODIUM: 141 mmol/L (ref 136–145)
eGFRcr - MALE: >90 mL/min/{1.73_m2} (ref >=60)

## 2024-05-19 LAB — CBC WITH DIFF
BASOPHIL #: <0.04 10*3/uL (ref <=0.20)
BASOPHIL %: 0.4 %
EOSINOPHIL #: 0.15 10*3/uL (ref <=0.50)
EOSINOPHIL %: 2 %
HCT: 40.7 % (ref 38.9–52.0)
HGB: 13.8 g/dL (ref 13.4–17.5)
IMMATURE GRANULOCYTE #: <0.04 10*3/uL (ref <0.10)
IMMATURE GRANULOCYTE %: 0.3 % (ref 0.0–1.0)
LYMPHOCYTE #: 2.16 10*3/uL (ref 1.00–4.80)
LYMPHOCYTE %: 29.5 %
MCH: 29.4 pg (ref 26.0–32.0)
MCHC: 33.9 g/dL (ref 31.0–35.5)
MCV: 86.6 fL (ref 78.0–100.0)
MONOCYTE #: 0.46 10*3/uL (ref 0.20–1.10)
MONOCYTE %: 6.3 %
MPV: 9.2 fL (ref 8.7–12.5)
NEUTROPHIL #: 4.5 10*3/uL (ref 1.50–7.70)
NEUTROPHIL %: 61.5 %
PLATELETS: 248 10*3/uL (ref 150–400)
RBC: 4.7 10*6/uL (ref 4.50–6.10)
RDW-CV: 12.2 % (ref 11.5–15.5)
WBC: 7.3 10*3/uL (ref 3.7–11.0)

## 2024-05-19 LAB — POC BLOOD GLUCOSE (RESULTS)
GLUCOSE, POC: 95 mg/dL (ref 70–99)
GLUCOSE, POC: 116 mg/dL — ABNORMAL HIGH (ref 70–99)

## 2024-05-19 MED ORDER — CEFDINIR 300 MG CAPSULE: 300.0000 mg | ORAL_CAPSULE | Freq: Two times a day (BID) | ORAL | 0 refills | Status: AC

## 2024-05-19 MED ORDER — METOPROLOL TARTRATE 100 MG TABLET: 50.0000 mg | ORAL_TABLET | Freq: Two times a day (BID) | ORAL | Status: DC

## 2024-05-19 MED ORDER — AZITHROMYCIN 500 MG TABLET: 500.0000 mg | ORAL_TABLET | Freq: Every day | ORAL | 0 refills | Status: AC

## 2024-05-19 NOTE — Care Plan (Signed)
 Shift Summary  Fall risk assessment and universal precautions were implemented, and ambulation with a walker was completed without incident.  Safety rounds and interventions were documented, and the patient refused a bed alarm but remained safe during mobility activities.  PRN medications were administered, and blood glucose was checked and noted as high.  No falls or injuries occurred during the shift, and overall safety was maintained.  Patient has been on RA since start of shift and O2 has remained stable while sleeping and ambulating.    Absence of Fall and Fall-Related Injury: Universal fall precautions were maintained throughout the shift, and ambulation with a walker was completed without incident; no falls or injuries occurred. Safety rounds and interventions were documented, and the patient refused a bed alarm but remained safe during mobility activities.    Goal Outcome Evaluation:     Anxieties, Fears or Concerns: (not recorded)  Individualized Care Needs: (not recorded)  Patient-Specific Goals (Include Timeframe): (not recorded)  Plan of Care Reviewed With: (not recorded)     Patient Progress: improving

## 2024-05-19 NOTE — Discharge Summary (Signed)
 [facility]  DISCHARGE SUMMARY    PATIENT NAME:  Edwin [name], Edwin [name]  MRN:  [id number]  DOB:  07/09/1955    ENCOUNTER DATE:  05/13/2024  INPATIENT ADMISSION DATE: 05/14/2024  DISCHARGE DATE:  05/19/2024    ATTENDING PHYSICIAN: Thersia Baller, DO  SERVICE: [facility] HOSPITALIST  PRIMARY CARE PHYSICIAN: Leafy Argyle, MD       No lay caregiver identified.    PRIMARY DISCHARGE DIAGNOSIS: Hypoxia  Active Hospital Problems    Diagnosis Date Noted    Principal Problem: Hypoxia [R09.02] 05/14/2024    Acute bronchitis [J20.9] 05/14/2024    Insulin  pump in place [Z96.41] 05/14/2024    Influenza A [J10.1] 05/04/2024      Resolved Hospital Problems   No resolved problems to display.     Active Non-Hospital Problems    Diagnosis Date Noted    Stroke-like symptoms 05/09/2024    S/P admn tPA in diff fac w/n last 24 hr bef adm to crnt fac 05/09/2024    Type 2 diabetes mellitus without complication, without long-term current use of insulin  05/09/2024    Cerebrovascular accident (CVA), unspecified mechanism (CMS HCC) 05/07/2024    Dyspnea 05/04/2024    Sinus tachycardia 05/04/2024    Sepsis due to pneumonia (CMS HCC) 10/07/2023    Dizziness 12/30/2022    Encounter for loop recorder at end of battery life 07/11/2022    TIA (transient ischemic attack) 07/15/2020    CAD (coronary artery disease) 05/29/2020    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        Details   azithromycin  500 mg Tablet  Commonly known as: ZITHROMAX   Start taking on: May 20, 2024   500 mg, Oral, Daily  Qty: 5 Tablet  Refills: 0     cefdinir  300 mg Capsule  Commonly known as: OMNICEF    300 mg, Oral, 2 TIMES DAILY  Qty: 10 Capsule  Refills: 0            CONTINUE these medications which have CHANGED during your visit.        Details   isosorbide  mononitrate 30 mg Tablet Sustained Release 24 hr  Commonly known as: IMDUR   What changed: See the new instructions.   TAKE ONE TABLET ( 30  MG ) BY MOUTH EVERY MORNING  Qty: 90 Tablet  Refills: 0     metoprolol  tartrate 100 mg Tablet  Commonly known as: LOPRESSOR   What changed: how much to take   50 mg, Oral, 2 TIMES DAILY  Refills: 0            CONTINUE these medications - NO CHANGES were made during your visit.        Details   acetaminophen  500 mg Tablet  Commonly known as: TYLENOL    500 mg, EVERY 4 HOURS PRN  Refills: 0     aspirin  81 mg Tablet, Delayed Release (E.C.)  Commonly known as: ECOTRIN   81 mg, Oral, Daily  Refills: 0     atorvastatin  40 mg Tablet  Commonly known as: LIPITOR   40 mg, EVERY EVENING  Refills: 0     benzonatate  100 mg Capsule  Commonly known as: TESSALON    100 mg, Oral, EVERY 8 HOURS PRN  Refills: 0     budesonide  0.5 mg/2 mL nebulizer suspension  Commonly known as: PULMICORT  RESPULES   0.5 mg, Nebulization,  2 TIMES DAILY  Refills: 0     cholecalciferol  (Vitamin D3) 125 mcg (5,000 unit) Tablet   5,000 Units, Oral, Daily  Refills: 0     clopidogrel  75 mg Tablet  Commonly known as: PLAVIX    75 mg, Oral, Daily  Qty: 90 Tablet  Refills: 0     cyanocobalamin  1,000 mcg/mL Solution  Commonly known as: VITAMIN B12   1,000 mcg, EVERY 30 DAYS  Refills: 0     ergocalciferol  (vitamin D2) 1,250 mcg (50,000 unit) Capsule  Commonly known as: DRISDOL    50,000 Units, EVERY 3 DAYS  Refills: 0     ezetimibe  10 mg Tablet  Commonly known as: ZETIA    10 mg, EVERY EVENING  Refills: 0     Farxiga  10 mg Tablet  Generic drug: dapagliflozin  propanediol   10 mg, Oral, Daily  Qty: 90 Tablet  Refills: 3     folic acid  1 mg Tablet  Commonly known as: FOLVITE    1 mg, Oral, Daily  Qty: 30 Tablet  Refills: 0     furosemide  20 mg Tablet  Commonly known as: LASIX    20 mg, Oral, Daily  Qty: 90 Tablet  Refills: 3     HumaLOG  U-100 Insulin  100 unit/mL Solution  Generic drug: insulin  lispro   ON INSULIN  PUMP  Refills: 0     ipratropium bromide  21 mcg (0.03 %) nasal spray  Commonly known as: ATROVENT    2 Sprays, 3 TIMES DAILY  Refills: 0     lactobacillus rhamnosus  (GG) 10 billion cell Capsule  Commonly known as: CULTURELLE   1 Capsule, 2 TIMES DAILY WITH FOOD  Refills: 0     * levalbuterol  HCl 0.63 mg/3 mL Solution for Nebulization  Commonly known as: XOPENEX    0.63 mg, Nebulization, EVERY 4 HOURS PRN  Refills: 0     * levalbuterol  HCl 0.63 mg/3 mL Solution for Nebulization  Commonly known as: XOPENEX    0.63 mg, Nebulization, 3 TIMES DAILY  Refills: 0     lidocaine  5 % Adhesive Patch, Medicated  Commonly known as: LIDODERM    1 Patch, Daily  Refills: 0     Magnesium  200 mg Tablet   Take by mouth  Refills: 0     mupirocin  2 % Ointment  Commonly known as: BACTROBAN    2 TIMES DAILY  Refills: 0     nitroGLYCERIN  0.4 mg Tablet, Sublingual  Commonly known as: NITROSTAT    DISSOLVE 1 TABLET UNDER TONGUE AT FIRST SIGN OF CHEST PAIN AS NEEDED, MAY REPEAT EVERY 5 MINS. UP TO 3 DOSES, NO MORE THAN 3 TABS IN A 15 MIN. PERIOD  Qty: 25 Tablet  Refills: 1     ondansetron  2 mg/mL Solution  Commonly known as: ZOFRAN    4 mg, Intravenous, EVERY 6 HOURS PRN  Refills: 0     Ozempic 0.25 mg or 0.5 mg (2 mg/3 mL) Pen Injector  Generic drug: semaglutide   0.5 mg, Subcutaneous, EVERY 7 DAYS  Refills: 0     pantoprazole  40 mg Tablet, Delayed Release (E.C.)  Commonly known as: PROTONIX    40 mg, Daily  Refills: 0     polyethylene glycol 17 gram Powder in Packet  Commonly known as: MIRALAX    17 g, Daily  Refills: 0     ranolazine  500 mg Tablet Sustained Release 12 hr  Commonly known as: RANEXA    500 mg, Oral, 2 TIMES DAILY  Qty: 180 Tablet  Refills: 0     rOPINIRole  0.5 mg  Tablet  Commonly known as: REQUIP    0.5 mg, 3 TIMES DAILY  Refills: 0     sertraline  50 mg Tablet  Commonly known as: ZOLOFT    50 mg, Daily  Refills: 0     Vascepa  1 gram Capsule  Generic drug: icosapent  ethyl   2 g, 2 TIMES DAILY  Refills: 0           * This list has 2 medication(s) that are the same as other medications prescribed for you. Read the directions carefully, and ask your doctor or other care provider to review them with  you.                STOP taking these medications.      oseltamivir  75 mg Capsule  Commonly known as: TAMIFLU      predniSONE  20 mg Tablet  Commonly known as: DELTASONE             Discharge med list refreshed?  YES     Allergies[1]  HOSPITAL PROCEDURE(S):   No orders of the defined types were placed in this encounter.      REASON FOR HOSPITALIZATION AND HOSPITAL COURSE   69 y.o. male with past medical history of type II DM with insulin  pump, MI s/p PCI, cholecystectomy, HTN, and recent concern for CVA who presents with worsening cough, dyspnea, and hypoxia. He was admitted 05/14/2024 after a recent admission [facility] for influenza A, then transferred to [facility] for possible CVA. TENECTEplase  was administered prior to transfer. MRI showed no evidence of CVA. Patient reports continued cough that produced green sputum, SOB, fever, and nausea. CTA chest is negative for PE and CXR shows no acute process. Patient remained hypoxic on RA and required 2L NC to maintain O2 saturations >90%. Lab work-up unremarkable. Patient monitored for hypoxic respiratory failure likely secondary to Flu A and treated with antibiotics and nebulizer treatments with steady improvement. O2 removed 1/31 and O2 walk test overnight showed patient maintained saturations >90% ambulating on room air. Medically stable for discharge.       TRANSITION/POST DISCHARGE CARE/PENDING TESTS/REFERRALS:   PCP Follow Up       CONDITION ON DISCHARGE:  A. Ambulation: Full ambulation  B. Self-care Ability: Complete  C. Cognitive Status Alert and Oriented x 3  D. Code status at discharge:       LINES/DRAINS/WOUNDS AT DISCHARGE:   Patient Lines/Drains/Airways Status       Active Line / Dialysis Catheter / Dialysis Graft / Drain / Airway / Wound       Name Placement date Placement time Site Days    Peripheral IV Anterior;Left 05/13/24  1543  -- 5                    DISCHARGE DISPOSITION:  Home discharge  DISCHARGE INSTRUCTIONS:  Post-Discharge Follow Up Appointments        Thursday Nov 21, 2024    Return Patient Visit with Larrie Newman NOVAK, MD at  2:40 PM      Cardiology [facility], [facility]  [facility] [place], [place]  [address]  [place] [address]  [phone]             DME - HOME OXYGEN     Study used to evaluate oxygen  saturation at rest vs exercise was performed during an inpatient hospital stay within 2 days prior to discharge date and was the last test performed prior to discharge and met the following  criteria:   Study performed at rest without oxygen .  Study performed during exercise without oxygen .  Study performed during exercise with oxygen  applied that demonstrates improvement.    Results of oxygen  trials:                              Ht 172.7 cm    Wt 91.63 kg    Current Attending: Cheryll Riggs    I certify this pt is under my care as an attending physician & that I, or a collaborating ANP/PA had a face to face encounter with the pt or the durable medical power of attorney, as appropriate and explained the need for DME on the following date: 05/14/2024    The primary diagnosis as discussed in the face to face encounter justifying the need for home health services/DME is as follows: COPD    New Patient or Renewal? New Patient    Liter flow per minute 2    RA O2 Sats at rest: 86    RA O2 Sats at Rest Date: 05/14/2024    RA O2 Sats at Rest Time: 8:47 AM    I certify that home oxygen  is necessary due to the following condition At rest, (awake but sitting or lying down) O2 sat is at or below 88%    Physician has determined that patient has a severe lung disease or hypoxia related symptoms that will improve with oxygen  Yes    Start Date 05/14/2024    Type of Concentrator STATIONARY CONCENTRATOR    Type of Concentrator PORTABLE OXYGEN  TANKS    Type of Concentrator OXYGEN  CONTENTS    Freedom of Choice: I have informed patient of their freedom of choice with respect to DME providers    Duration of Oxygen   Therapy: 24 HOURS PER DAY    Patient is mobile within the home and will require portable oxygen : Yes    Estimated Length of Need (in months; 99 mo.= lifetime) 99    Delivery Device Nasal Cannula    Type of Oxygen  Requested GAS           Verdon Karma, DO    Copies sent to Care Team         Relationship Specialty Notifications Start End    Froylan Jester, MD PCP - General FAMILY PRACTICE  05/29/20     Phone: [phone] Fax: [phone]         [address] [place] [address]            Referring providers can utilize [email or web address] to access their referred [facility] patient's information.                               [1]   Allergies  Allergen Reactions    Gabapentin  Other Adverse Reaction (Add comment)     hallucinations      Morphine  Other Adverse Reaction (Add comment)     Hallucinations      Tylenol  Pm [Diphenhydramine-Acetaminophen ]      Patient denied  Patient has tolerated acetaminophen  in the past    Keflex [Cephalexin] Nausea/ Vomiting    Victoza [Liraglutide] Nausea/ Vomiting

## 2024-05-19 NOTE — Pharmacy (Signed)
 Reviewed discharge medication list with patient . Reviewed name, dose, strength  and use for each medication. Left med list with patient. Patient denied further questions.

## 2024-05-19 NOTE — Nurses Notes (Signed)
 Patient ambulated with staff and respiratory down to vending machine on RA and stayed 90% and above. Patient stated he is feeling better and ready to go home.

## 2024-05-19 NOTE — Progress Notes (Incomplete)
 Hospitalist Progress Note    Edwin [name]  Date of service: 05/19/24   Date of Admission:  05/13/2024  Hospital Day:  LOS: 5 days     Subjective/Interval Events: 69 y.o. male with past medical history of type II DM with insulin  pump, MI s/p PCI, cholecystectomy, HTN, and recent concern for CVA who presents with worsening cough, dyspnea, and hypoxia. He was admitted 05/14/2024 after a recent admission [facility] for influenza A, then transferred to [facility] for possible CVA. TENECTEplase  was administered prior to transfer. MRI showed no evidence of CVA. Patient reports continued cough that produces green sputum, SOB, fever, and nausea. CTA chest is negative for PE and CXR shows no acute process. Patient remains hypoxic on RA and is requiring 2L NC to maintain O2 saturations >90%. Lab work-up is unremarkable. Patient monitored for hypoxic respiratory failure likely secondary to Flu A.  Patient is still requiring oxygen  today but is now down to 1 L supplemental oxygen .  Reports his cough and shortness of breath have improved with the use of Mucomyst .  Labs and chest x-ray are stable.  We will continue working to wean to room air.    Vital Signs:  Temp (24hrs) Max:36.6 C (97.9 F)      Systolic (24hrs), Avg:129 , Min:120 , Max:142     Diastolic (24hrs), Avg:71, Min:65, Max:83    Temp  Avg: 36.5 C (97.7 F)  Min: 36.4 C (97.5 F)  Max: 36.6 C (97.9 F)  MAP (Non-Invasive)  Avg: 82.5 mmHG  Min: 82 mmHG  Max: 83 mmHG  Pulse  Avg: 71.7  Min: 70  Max: 74  Resp  Avg: 17  Min: 16  Max: 18  SpO2  Avg: 94.8 %  Min: 90 %  Max: 100 %       I/O:  I/O last 24 hours:    Intake/Output Summary (Last 24 hours) at 05/19/2024 0946  Last data filed at 05/19/2024 0913  Gross per 24 hour   Intake 2290 ml   Output --   Net 2290 ml     I/O current shift:  02/01 0700 - 02/01 1859  In: 380 [P.O.:360]  Out: -     Current Medications:  acetaminophen  (TYLENOL ) tablet, 650 mg, Oral, Q4H PRN  acetylcysteine  (MUCOMYST ) 20% nebulized solution, 4 mL,  Nebulization, 3x/day  aluminum -magnesium  hydroxide-simethicone  (MAG-AL PLUS) 200-200-20 mg per 5 mL oral liquid, 30 mL, Oral, Q4H PRN  aspirin  (ECOTRIN) enteric coated tablet 81 mg, 81 mg, Oral, Daily  atorvastatin  (LIPITOR) tablet, 40 mg, Oral, QPM  azithromycin  (ZITHROMAX ) tablet, 500 mg, Oral, Daily  benzonatate  (TESSALON ) capsule, 100 mg, Oral, Q8H PRN  budesonide  (PULMICORT  RESPULES) 0.5 mg/2 mL nebulizer suspension, 1 mg, Nebulization, 2x/day  cefTRIAXone  (ROCEPHIN ) 2 g in NS 20 mL IV injection, 2 g, Intravenous, Q24H  clopidogrel  (PLAVIX ) 75 mg tablet, 75 mg, Oral, Daily  D10W bolus infusion 250 mL, 250 mL, Intravenous, Q1H PRN  dapagliflozin  (FARXIGA ) tablet, 10 mg, Oral, Daily  dextrose  (GLUTOSE) 40% oral gel, 15 g, Oral, Q15 Min PRN  ezetimibe  (ZETIA ) tablet, 10 mg, Oral, QPM  folic acid  (FOLVITE ) tablet, 1 mg, Oral, Daily  furosemide  (LASIX ) tablet, 20 mg, Oral, Daily  glucagon  injection 1 mg, 1 mg, IntraMUSCULAR, Once PRN  guaiFENesin  100mg  per 5mL oral liquid - for cough (expectorant), 400 mg, Oral, Q6H PRN  icosapent  ethyl (VASCEPA ) capsule, 2 g, Oral, 2x/day  Insulin  Medication Pump - Patient's own, 1 Each, Subcutaneous, Continuous  ipratropium (ATROVENT ) 0.02% nebulizer solution,  0.5 mg, Nebulization, Q4H PRN  isosorbide  mononitrate (IMDUR ) 24 hr extended release tablet, 30 mg, Oral, QAM  lactobacillus rhamnosus (CULTURELLE) active cultures capsule, 1 Capsule, Oral, 2x/day-Food  levalbuterol  (XOPENEX ) 0.63 mg/ 3 mL nebulizer solution, 0.63 mg, Nebulization, Q4H PRN  levalbuterol  (XOPENEX ) 0.63 mg/ 3 mL nebulizer solution, 0.63 mg, Nebulization, 4x/day  melatonin tablet, 3 mg, Oral, HS PRN  metoprolol  tartrate (LOPRESSOR ) tablet, 50 mg, Oral, 2x/day  NS 250 mL flush bag, , Intravenous, Q15 Min PRN  NS flush syringe, 10 mL, Intracatheter, Q8HRS  NS flush syringe, 10 mL, Intracatheter, Q1H PRN  ondansetron  (ZOFRAN ) 2 mg/mL injection, 4 mg, Intravenous, Q6H PRN  pantoprazole  (PROTONIX ) delayed release  tablet, 40 mg, Oral, Daily  polyethylene glycol (MIRALAX ) oral packet, 17 g, Oral, Daily PRN  ranolazine  (RANEXA ) extended release tablet, 500 mg, Oral, 2x/day  rOPINIRole  (REQUIP ) tablet, 0.5 mg, Oral, 3x/day  sennosides-docusate sodium  (SENOKOT-S) 8.6-50mg  per tablet, 1 Tablet, Oral, 2x/day  sertraline  (ZOLOFT ) tablet, 50 mg, Oral, Daily            ROS:   All systems reviewed and negative except for what is noted above in the HPI       Examination:  Gen:  69 y.o. White male in no apparent distress.  HENT:  mmm  CV:  regular rate and rhythm   Resp:  rhonchi throughout with deep inspiration, with occasional cough  Abd:  soft, non tender, bowel sounds present  Ext:  no edema, distal pulses in tact  Neuro:  non focal, alert and oriented, moves all 4   Pscyh:  normal mood and affect       Labs:    CBC (Last 24 Hours):    Recent Results last 24 hours     05/19/24  0607   WBC 7.3   HGB 13.8   HCT 40.7   MCV 86.6   PLTCNT 248       Last BMP  (Last result in the past 24 hours)        Na   K   Cl   CO2   BUN   Cr   Calcium   Glucose   Glucose-Fasting        05/19/24 0607 141   4.6   104   28   16   0.70   8.9  Comment: Gadolinium-containing contrast can interfere with calcium measurement.     107               No results for input(s): MAGNESIUM , PHOSPHORUS in the last 24 hours.  No results for input(s): INR, APTT in the last 24 hours.  TROPONIN I   Date Value Ref Range Status   12/13/2016 <=0.01 <=0.03 ng/mL Final         Imaging:             Microbiology:  No results found for any visits on 05/13/24 (from the past 96 hours).            Assessment/ Plan:     Active Hospital Problems    Diagnosis    Primary Problem: Hypoxia    Acute bronchitis    Insulin  pump in place    Influenza A       Acute Bronchitis Hypoxic Respiratory Failure  - Treat with empiric Rocephin , Azithromycin , Solu-Medrol , nebs, and Tessalon . O2 2L; wean as tolerated. Follow daily CXR.  Patient endorses continued symptoms of cough and congestion  but improving.  Still short of breath  with any activity.  Still requiring oxygen  but down to 1L NC. Still showing signs of rhonchi and wheezes.  Unable to tolerate steroids due to previous history of panic attacks     Influenza A  - Tested positive on 05/04/24. Completed course of Tamiflu .  Continue current medical treatment plan. Continue to wean oxygen .     Chronic medical conditions:   CAD: S/p PCI, no reported CP or typical angina symptoms. Cardiac enzymes negative in ED. Resume home medical management for CAD and associated risk factors.   H/o CVA: Recent concern for CVA vs TIA; received TENECTEplase  and transferred to [facility]. No evidence of CVA on MRI. Resume DAPT and Statin.   HTN: Monitor scheduled vitals. Resume home antihypertensive medications.   HLD: Continue Statin.   IDDM type II: On Ozempic and administrated q Thursday which will be held during admission. Resume SGLT-2 inhibitor. On insulin  pump, monitor FSBS closely.  Sugars remain under good control.  GERD: On PPI   Mixed anxiety/depression: Continue Zoloft  daily  RLS: On Requip  TID      DVT/PE Prophylaxis: Plavix  and aspirin       Verdon Karma, DO  Hospitalist, [facility]

## 2024-05-19 NOTE — Nurses Notes (Signed)

## 2024-05-20 DIAGNOSIS — R000 Tachycardia, unspecified: Secondary | ICD-10-CM

## 2024-05-20 LAB — ECG 12 LEAD
Atrial Rate: 132 {beats}/min
Calculated P Axis: 41 degrees
Calculated R Axis: 35 degrees
Calculated T Axis: 35 degrees
PR Interval: 168 ms
QRS Duration: 84 ms
QT Interval: 318 ms
QTC Calculation: 471 ms
Ventricular rate: 132 {beats}/min

## 2024-05-21 ENCOUNTER — Telehealth (HOSPITAL_COMMUNITY): Payer: Self-pay | Admitting: Family Medicine

## 2024-05-22 ENCOUNTER — Other Ambulatory Visit (INDEPENDENT_AMBULATORY_CARE_PROVIDER_SITE_OTHER): Payer: Self-pay | Admitting: Cardiovascular Disease

## 2024-05-22 DIAGNOSIS — I2510 Atherosclerotic heart disease of native coronary artery without angina pectoris: Secondary | ICD-10-CM

## 2024-05-22 DIAGNOSIS — I10 Essential (primary) hypertension: Secondary | ICD-10-CM

## 2024-05-22 DIAGNOSIS — I679 Cerebrovascular disease, unspecified: Secondary | ICD-10-CM

## 2024-05-22 DIAGNOSIS — I639 Cerebral infarction, unspecified: Secondary | ICD-10-CM

## 2024-05-22 NOTE — Telephone Encounter (Signed)
 Last office visit- 03/29/2024  Follow up scheduled- 11/21/2024    Called patient and confirmed he is now taking Metoprolol  50 MG BID- [facility] doctor advised him to do this during his last admission

## 2024-05-23 ENCOUNTER — Telehealth (HOSPITAL_COMMUNITY): Payer: Self-pay | Admitting: Family Medicine

## 2024-11-21 ENCOUNTER — Ambulatory Visit (HOSPITAL_COMMUNITY): Payer: Self-pay | Admitting: Cardiovascular Disease
